# Patient Record
Sex: Male | Born: 1985 | Race: White | Hispanic: No | Marital: Single
Health system: Southern US, Community
[De-identification: ages and names within clinical notes are randomized; demographics above are authoritative.]

## PROBLEM LIST (undated history)

## (undated) ENCOUNTER — Ambulatory Visit (HOSPITAL_COMMUNITY): Admission: EM | Payer: 59

## (undated) DIAGNOSIS — F32A Depression, unspecified: Secondary | ICD-10-CM

## (undated) DIAGNOSIS — N289 Disorder of kidney and ureter, unspecified: Secondary | ICD-10-CM

## (undated) DIAGNOSIS — F329 Major depressive disorder, single episode, unspecified: Secondary | ICD-10-CM

## (undated) DIAGNOSIS — F1410 Cocaine abuse, uncomplicated: Secondary | ICD-10-CM

## (undated) DIAGNOSIS — I219 Acute myocardial infarction, unspecified: Secondary | ICD-10-CM

## (undated) DIAGNOSIS — F419 Anxiety disorder, unspecified: Secondary | ICD-10-CM

## (undated) DIAGNOSIS — N200 Calculus of kidney: Secondary | ICD-10-CM

## (undated) DIAGNOSIS — F1510 Other stimulant abuse, uncomplicated: Secondary | ICD-10-CM

## (undated) DIAGNOSIS — F1010 Alcohol abuse, uncomplicated: Secondary | ICD-10-CM

## (undated) DIAGNOSIS — F3181 Bipolar II disorder: Secondary | ICD-10-CM

## (undated) DIAGNOSIS — K279 Peptic ulcer, site unspecified, unspecified as acute or chronic, without hemorrhage or perforation: Secondary | ICD-10-CM

## (undated) DIAGNOSIS — F1110 Opioid abuse, uncomplicated: Secondary | ICD-10-CM

## (undated) DIAGNOSIS — F1910 Other psychoactive substance abuse, uncomplicated: Secondary | ICD-10-CM

---
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
## 2004-10-27 HISTORY — PX: HAND SURGERY: SHX662

## 2008-03-13 ENCOUNTER — Emergency Department (HOSPITAL_COMMUNITY): Admission: EM | Admit: 2008-03-13 | Discharge: 2008-03-13 | Payer: Self-pay | Admitting: Emergency Medicine

## 2010-04-05 ENCOUNTER — Emergency Department (HOSPITAL_COMMUNITY): Admission: EM | Admit: 2010-04-05 | Discharge: 2010-04-05 | Payer: Self-pay | Admitting: Family Medicine

## 2011-07-09 ENCOUNTER — Emergency Department (HOSPITAL_COMMUNITY)
Admission: EM | Admit: 2011-07-09 | Discharge: 2011-07-10 | Disposition: A | Discharge status: Home / Self Care | Payer: Worker's Compensation | Attending: Emergency Medicine | Admitting: Emergency Medicine

## 2011-07-09 ENCOUNTER — Emergency Department (HOSPITAL_COMMUNITY): Payer: Worker's Compensation

## 2011-07-09 DIAGNOSIS — Y9289 Other specified places as the place of occurrence of the external cause: Secondary | ICD-10-CM | POA: Insufficient documentation

## 2011-07-09 DIAGNOSIS — S6720XA Crushing injury of unspecified hand, initial encounter: Secondary | ICD-10-CM | POA: Insufficient documentation

## 2011-07-09 DIAGNOSIS — W319XXA Contact with unspecified machinery, initial encounter: Secondary | ICD-10-CM | POA: Insufficient documentation

## 2011-07-09 DIAGNOSIS — M79609 Pain in unspecified limb: Secondary | ICD-10-CM | POA: Insufficient documentation

## 2011-07-23 LAB — DIFFERENTIAL
Basophils Absolute: 0
Basophils Relative: 0
Eosinophils Absolute: 0.2
Eosinophils Relative: 1
Lymphocytes Relative: 13
Lymphs Abs: 1.7
Monocytes Absolute: 0.8
Monocytes Relative: 6
Neutro Abs: 10.6 — ABNORMAL HIGH
Neutrophils Relative %: 80 — ABNORMAL HIGH

## 2011-07-23 LAB — CBC
HCT: 46.8
Hemoglobin: 16.2
MCHC: 34.7
MCV: 91.5
Platelets: 215
RBC: 5.11
RDW: 13.3
WBC: 13.3 — ABNORMAL HIGH

## 2012-02-17 ENCOUNTER — Emergency Department (HOSPITAL_COMMUNITY): Payer: Self-pay

## 2012-02-17 ENCOUNTER — Encounter (HOSPITAL_COMMUNITY): Payer: Self-pay | Admitting: *Deleted

## 2012-02-17 ENCOUNTER — Emergency Department (HOSPITAL_COMMUNITY)
Admission: EM | Admit: 2012-02-17 | Discharge: 2012-02-17 | Disposition: A | Discharge status: Home / Self Care | Payer: Self-pay | Attending: Emergency Medicine | Admitting: Emergency Medicine

## 2012-02-17 DIAGNOSIS — R079 Chest pain, unspecified: Secondary | ICD-10-CM | POA: Insufficient documentation

## 2012-02-17 DIAGNOSIS — M542 Cervicalgia: Secondary | ICD-10-CM | POA: Insufficient documentation

## 2012-02-17 DIAGNOSIS — IMO0002 Reserved for concepts with insufficient information to code with codable children: Secondary | ICD-10-CM | POA: Insufficient documentation

## 2012-02-17 DIAGNOSIS — Z23 Encounter for immunization: Secondary | ICD-10-CM | POA: Insufficient documentation

## 2012-02-17 DIAGNOSIS — R0609 Other forms of dyspnea: Secondary | ICD-10-CM | POA: Insufficient documentation

## 2012-02-17 DIAGNOSIS — R0989 Other specified symptoms and signs involving the circulatory and respiratory systems: Secondary | ICD-10-CM | POA: Insufficient documentation

## 2012-02-17 DIAGNOSIS — R51 Headache: Secondary | ICD-10-CM | POA: Insufficient documentation

## 2012-02-17 DIAGNOSIS — R0602 Shortness of breath: Secondary | ICD-10-CM | POA: Insufficient documentation

## 2012-02-17 DIAGNOSIS — M549 Dorsalgia, unspecified: Secondary | ICD-10-CM | POA: Insufficient documentation

## 2012-02-17 DIAGNOSIS — F17200 Nicotine dependence, unspecified, uncomplicated: Secondary | ICD-10-CM | POA: Insufficient documentation

## 2012-02-17 DIAGNOSIS — R109 Unspecified abdominal pain: Secondary | ICD-10-CM | POA: Insufficient documentation

## 2012-02-17 DIAGNOSIS — S2220XA Unspecified fracture of sternum, initial encounter for closed fracture: Secondary | ICD-10-CM | POA: Insufficient documentation

## 2012-02-17 LAB — COMPREHENSIVE METABOLIC PANEL
AST: 20 U/L (ref 0–37)
Albumin: 4.5 g/dL (ref 3.5–5.2)
CO2: 23 mEq/L (ref 19–32)
Calcium: 9.8 mg/dL (ref 8.4–10.5)
Creatinine, Ser: 1.16 mg/dL (ref 0.50–1.35)
GFR calc non Af Amer: 85 mL/min — ABNORMAL LOW (ref >90)
Total Protein: 8.3 g/dL (ref 6.0–8.3)

## 2012-02-17 LAB — POCT I-STAT, CHEM 8
Chloride: 108 mEq/L (ref 96–112)
Creatinine, Ser: 1.2 mg/dL (ref 0.50–1.35)
Glucose, Bld: 102 mg/dL — ABNORMAL HIGH (ref 70–99)
Potassium: 4.1 mEq/L (ref 3.5–5.1)

## 2012-02-17 LAB — CBC
Hemoglobin: 15.4 g/dL (ref 13.0–17.0)
MCH: 31.4 pg (ref 26.0–34.0)
RBC: 4.9 MIL/uL (ref 4.22–5.81)

## 2012-02-17 LAB — DIFFERENTIAL
Eosinophils Absolute: 0.1 10*3/uL (ref 0.0–0.7)
Lymphs Abs: 2.8 10*3/uL (ref 0.7–4.0)
Monocytes Relative: 8 % (ref 3–12)
Neutro Abs: 18.7 10*3/uL — ABNORMAL HIGH (ref 1.7–7.7)
Neutrophils Relative %: 80 % — ABNORMAL HIGH (ref 43–77)

## 2012-02-17 LAB — ETHANOL: Alcohol, Ethyl (B): <11 mg/dL (ref 0–11)

## 2012-02-17 MED ORDER — OXYCODONE-ACETAMINOPHEN 5-325 MG PO TABS: 2.0000 | ORAL_TABLET | ORAL | Status: AC | PRN

## 2012-02-17 MED ORDER — FENTANYL CITRATE 0.05 MG/ML IJ SOLN
50.0000 ug | Freq: Once | INTRAMUSCULAR | Status: AC
  Administered 2012-02-17: 50 ug via INTRAVENOUS
  Filled 2012-02-17: qty 2

## 2012-02-17 MED ORDER — IOHEXOL 300 MG/ML  SOLN
100.0000 mL | Freq: Once | INTRAMUSCULAR | Status: AC | PRN
  Administered 2012-02-17: 100 mL via INTRAVENOUS

## 2012-02-17 MED ORDER — TETANUS-DIPHTHERIA TOXOIDS TD 5-2 LFU IM INJ
0.5000 mL | INJECTION | Freq: Once | INTRAMUSCULAR | Status: AC
  Administered 2012-02-17: 0.5 mL via INTRAMUSCULAR
  Filled 2012-02-17: qty 0.5

## 2012-02-17 MED ORDER — OXYCODONE-ACETAMINOPHEN 5-325 MG PO TABS
2.0000 | ORAL_TABLET | Freq: Once | ORAL | Status: AC
  Administered 2012-02-17: 2 via ORAL
  Filled 2012-02-17: qty 2

## 2012-02-17 MED ORDER — ONDANSETRON HCL 4 MG PO TABS: 4.0000 mg | ORAL_TABLET | Freq: Four times a day (QID) | ORAL | Status: AC

## 2012-02-17 NOTE — Progress Notes (Signed)
Orthopedic Tech Progress Note Patient Details:  [name] 01-21-1986 [id number]  Patient ID: Brian [name], male   DOB: 03-23-1986, 26 y.o.   MRN: [id number] Made trauma visit  Hildred Priest 02/17/2012, 4:53 PM
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
## 2012-02-17 NOTE — ED Notes (Signed)
MD at bedside. 

## 2012-02-17 NOTE — ED Notes (Signed)
Patient transported to CT 
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
## 2012-02-18 ENCOUNTER — Telehealth: Payer: Self-pay | Admitting: Physician Assistant
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
## 2012-02-19 ENCOUNTER — Ambulatory Visit (INDEPENDENT_AMBULATORY_CARE_PROVIDER_SITE_OTHER): Payer: Self-pay | Admitting: Physician Assistant

## 2012-02-19 ENCOUNTER — Encounter (INDEPENDENT_AMBULATORY_CARE_PROVIDER_SITE_OTHER): Payer: Self-pay

## 2012-02-19 VITALS — BP 118/78 | HR 80 | Ht 68.0 in | Wt 131.6 lb

## 2012-02-19 DIAGNOSIS — IMO0002 Reserved for concepts with insufficient information to code with codable children: Secondary | ICD-10-CM

## 2012-02-19 DIAGNOSIS — T07XXXA Unspecified multiple injuries, initial encounter: Secondary | ICD-10-CM

## 2012-02-19 MED ORDER — HYDROCODONE-ACETAMINOPHEN 5-325 MG PO TABS: 1.0000 | ORAL_TABLET | ORAL | Status: DC | PRN

## 2012-02-19 MED ORDER — TRAMADOL HCL 50 MG PO TABS: 50.0000 mg | ORAL_TABLET | Freq: Four times a day (QID) | ORAL | Status: DC | PRN

## 2012-02-19 NOTE — Patient Instructions (Signed)
No lifting more than 10 to 15 lbs for the next 4 weeks, then increase activities as tolerated.
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
## 2012-02-25 ENCOUNTER — Telehealth: Payer: Self-pay | Admitting: Orthopedic Surgery

## 2012-02-25 MED ORDER — HYDROCODONE-ACETAMINOPHEN 5-325 MG PO TABS: 1.0000 | ORAL_TABLET | ORAL | Status: AC | PRN

## 2012-02-25 NOTE — Telephone Encounter (Signed)
Patient called in for more Norco. Says the tramadol makes him throw up. Only taking 200-400mg  ibuprofen 4x/day.  Discussed case with Dr. Hulen Skains.  Called in one final rx for Norco 5/325, #30, NR. Advised increase ibuprofen to 800mg  tid.

## 2012-02-27 ENCOUNTER — Telehealth: Payer: Self-pay | Admitting: Orthopedic Surgery

## 2012-02-27 NOTE — Telephone Encounter (Signed)
Says he noticed bruising over his sternum for the first time yesterday, wanted to make sure that was nothing to worry about. I reassured him.

## 2012-03-02 ENCOUNTER — Telehealth: Payer: Self-pay | Admitting: Orthopedic Surgery

## 2012-03-02 NOTE — Telephone Encounter (Signed)
Left message explaining that we couldn't refill the patient's medication. He should call back if we could be of any other assistance.

## 2012-03-03 ENCOUNTER — Telehealth: Payer: Self-pay | Admitting: Orthopedic Surgery

## 2012-03-03 NOTE — Telephone Encounter (Signed)
Patient called to say that his sternum was hurting more because he was getting sick, likely caught from his fiancee who was recently diagnosed with some viral respiratory illness. I explained that I could not refill his narcotic medication because of the decision made by my supervising physician but suggested he see a PCP who may be able to help ameliorate the symptoms of the URI that are causing him to hurt more.

## 2012-03-03 NOTE — Telephone Encounter (Signed)
Returned phone call.

## 2012-05-31 ENCOUNTER — Emergency Department (HOSPITAL_COMMUNITY)
Admission: EM | Admit: 2012-05-31 | Discharge: 2012-06-01 | Disposition: A | Discharge status: Home / Self Care | Payer: Self-pay | Attending: Emergency Medicine | Admitting: Emergency Medicine

## 2012-05-31 ENCOUNTER — Encounter (HOSPITAL_COMMUNITY): Payer: Self-pay | Admitting: Emergency Medicine

## 2012-05-31 DIAGNOSIS — N200 Calculus of kidney: Secondary | ICD-10-CM | POA: Insufficient documentation

## 2012-05-31 DIAGNOSIS — R319 Hematuria, unspecified: Secondary | ICD-10-CM | POA: Insufficient documentation

## 2012-05-31 DIAGNOSIS — R10813 Right lower quadrant abdominal tenderness: Secondary | ICD-10-CM | POA: Insufficient documentation

## 2012-05-31 DIAGNOSIS — R1031 Right lower quadrant pain: Secondary | ICD-10-CM | POA: Insufficient documentation

## 2012-05-31 LAB — CBC WITH DIFFERENTIAL/PLATELET
Basophils Absolute: 0 10*3/uL (ref 0.0–0.1)
Eosinophils Relative: 3 % (ref 0–5)
Lymphocytes Relative: 32 % (ref 12–46)
MCV: 92 fL (ref 78.0–100.0)
Platelets: 226 10*3/uL (ref 150–400)
RDW: 13.3 % (ref 11.5–15.5)
WBC: 9.2 10*3/uL (ref 4.0–10.5)

## 2012-05-31 LAB — COMPREHENSIVE METABOLIC PANEL
ALT: 8 U/L (ref 0–53)
AST: 15 U/L (ref 0–37)
CO2: 29 mEq/L (ref 19–32)
Calcium: 9.5 mg/dL (ref 8.4–10.5)
GFR calc non Af Amer: >90 mL/min (ref >90)
Sodium: 139 mEq/L (ref 135–145)
Total Protein: 7.8 g/dL (ref 6.0–8.3)

## 2012-05-31 LAB — URINALYSIS, ROUTINE W REFLEX MICROSCOPIC
Bilirubin Urine: NEGATIVE
Glucose, UA: NEGATIVE mg/dL
Specific Gravity, Urine: 1.012 (ref 1.005–1.030)
pH: 7 (ref 5.0–8.0)

## 2012-05-31 LAB — URINE MICROSCOPIC-ADD ON

## 2012-05-31 NOTE — ED Notes (Signed)
For 6 months, has been having RLQ pain; for past 3 months it has been getting worse and started to have hematuria (last time was mon/tues of last week); now pain in bil lower abd; denies dysuria; n/v

## 2012-05-31 NOTE — ED Notes (Signed)
Updated on wait times

## 2012-06-01 ENCOUNTER — Emergency Department (HOSPITAL_COMMUNITY): Payer: Self-pay

## 2012-06-01 LAB — PSA: PSA: 0.2 ng/mL (ref <=4.00)

## 2012-06-01 MED ORDER — HYDROCODONE-ACETAMINOPHEN 5-500 MG PO TABS: 1.0000 | ORAL_TABLET | Freq: Four times a day (QID) | ORAL | Status: AC | PRN

## 2012-06-01 MED ORDER — HYDROCODONE-ACETAMINOPHEN 5-325 MG PO TABS
1.0000 | ORAL_TABLET | Freq: Once | ORAL | Status: AC
  Administered 2012-06-01: 1 via ORAL
  Filled 2012-06-01: qty 1

## 2012-06-01 NOTE — ED Notes (Signed)
The patient is AOx4 and comfortable with his discharge instructions.  His wife is driving him home.

## 2012-06-01 NOTE — ED Provider Notes (Signed)
History     CSN: [id number]  Arrival date & time 05/31/12  2118   First MD Initiated Contact with Patient 06/01/12 0014      Chief Complaint  Patient presents with  . Pelvic Pain  . Abdominal Pain    RLQ  . Flank Pain    Right    (Consider location/radiation/quality/duration/timing/severity/associated sxs/prior treatment) HPI Comments: Patient presents with the complaints of lower abdominal pain, hematuria for the past several months. The pain is mainly in the right lower quadrant.  He denies fevers.    Patient is a 26 y.o. male presenting with pelvic pain, abdominal pain, and flank pain. The history is provided by the patient.  Pelvic Pain This is a chronic problem. Episode onset: 4-5 months ago. The problem occurs constantly. The problem has been gradually worsening. Associated symptoms include abdominal pain. Nothing aggravates the symptoms. Nothing relieves the symptoms. He has tried nothing for the symptoms.  Abdominal Pain The primary symptoms of the illness include abdominal pain.  Flank Pain Associated symptoms include abdominal pain.    History reviewed. No pertinent past medical history.  Past Surgical History  Procedure Date  . Hand surgery 2006    History reviewed. No pertinent family history.  History  Substance Use Topics  . Smoking status: Current Everyday Smoker -- 0.5 packs/day    Types: Cigarettes  . Smokeless tobacco: Not on file  . Alcohol Use: No      Review of Systems  Gastrointestinal: Positive for abdominal pain.  Genitourinary: Positive for flank pain and pelvic pain.  All other systems reviewed and are negative.    Allergies  Review of patient's allergies indicates no known allergies.  Home Medications   Current Outpatient Rx  Name Route Sig Dispense Refill  . HYDROCODONE-ACETAMINOPHEN 5-325 MG PO TABS Oral Take 1-2 tablets by mouth every 4 (four) hours as needed for pain. 30 tablet 0    BP 120/76  Pulse 84  Temp 98.5 F  (36.9 C) (Oral)  Resp 17  Ht 5\' 8"  (1.727 m)  Wt 135 lb (61.236 kg)  BMI 20.53 kg/m2  SpO2 98%  Physical Exam  Nursing note and vitals reviewed. Constitutional: He is oriented to person, place, and time. He appears well-developed and well-nourished. No distress.  HENT:  Head: Normocephalic.  Mouth/Throat: Oropharynx is clear and moist.  Neck: Normal range of motion. Neck supple.  Cardiovascular: Normal rate and regular rhythm.   Pulmonary/Chest: Effort normal and breath sounds normal.  Abdominal: Soft. Bowel sounds are normal. He exhibits no distension. There is tenderness.       There is ttp in the right lower quadrant without rebound.  There is voluntary guarding.  Musculoskeletal: Normal range of motion.  Neurological: He is alert and oriented to person, place, and time.  Skin: Skin is warm and dry. He is not diaphoretic.    ED Course  Procedures (including critical care time)  Labs Reviewed  COMPREHENSIVE METABOLIC PANEL - Abnormal; Notable for the following:    Total Bilirubin 0.1 (*)     All other components within normal limits  URINALYSIS, ROUTINE W REFLEX MICROSCOPIC - Abnormal; Notable for the following:    APPearance CLOUDY (*)     Hgb urine dipstick SMALL (*)     All other components within normal limits  CBC WITH DIFFERENTIAL  URINE MICROSCOPIC-ADD ON  PSA   No results found.   No diagnosis found.    MDM  The patient presents with hematuria and right  lower abd pain for several months.  I have found nothing to suggest an obstructing calculus, just a stone in the kidney.  There is also no alternative diagnosis from today's ct scan.  A psa was ordered as the patient tells me his father was diagnosed with prostate cancer at a young age.  At this point, he will be discharged to home.  I will recommend follow up with urology if his symptoms persist.  The psa will likely take a few days.          Veryl Speak, MD 06/01/12 [phone]

## 2013-03-29 ENCOUNTER — Encounter (HOSPITAL_COMMUNITY): Payer: Self-pay | Admitting: Emergency Medicine

## 2013-03-29 ENCOUNTER — Emergency Department (HOSPITAL_COMMUNITY)
Admission: EM | Admit: 2013-03-29 | Discharge: 2013-03-29 | Disposition: A | Discharge status: Home / Self Care | Payer: Self-pay | Attending: Emergency Medicine | Admitting: Emergency Medicine

## 2013-03-29 DIAGNOSIS — F17200 Nicotine dependence, unspecified, uncomplicated: Secondary | ICD-10-CM | POA: Insufficient documentation

## 2013-03-29 DIAGNOSIS — R209 Unspecified disturbances of skin sensation: Secondary | ICD-10-CM | POA: Insufficient documentation

## 2013-03-29 DIAGNOSIS — R5383 Other fatigue: Secondary | ICD-10-CM | POA: Insufficient documentation

## 2013-03-29 DIAGNOSIS — R5381 Other malaise: Secondary | ICD-10-CM | POA: Insufficient documentation

## 2013-03-29 DIAGNOSIS — G5600 Carpal tunnel syndrome, unspecified upper limb: Secondary | ICD-10-CM | POA: Insufficient documentation

## 2013-03-29 DIAGNOSIS — G5602 Carpal tunnel syndrome, left upper limb: Secondary | ICD-10-CM

## 2013-03-29 DIAGNOSIS — Z9889 Other specified postprocedural states: Secondary | ICD-10-CM | POA: Insufficient documentation

## 2013-03-29 MED ORDER — IBUPROFEN 400 MG PO TABS
800.0000 mg | ORAL_TABLET | Freq: Once | ORAL | Status: AC
  Administered 2013-03-29: 800 mg via ORAL
  Filled 2013-03-29: qty 2

## 2013-03-29 MED ORDER — HYDROCODONE-ACETAMINOPHEN 5-325 MG PO TABS: ORAL_TABLET | ORAL | Status: DC

## 2013-03-29 NOTE — ED Provider Notes (Signed)
History     CSN: [id number]  Arrival date & time 03/29/13  1403   First MD Initiated Contact with Patient 03/29/13 1412      Chief Complaint  Patient presents with  . Hand Pain    (Consider location/radiation/quality/duration/timing/severity/associated sxs/prior treatment) HPI  Brian [name] is a 27 y.o. male complaining of left wrist pain rated at 7/10, worse at night, worsening over the course last 4 days. He also reports associated symptoms of weakness, sensation of discoordination and paresthesia to digits one through 3. Patient is right-hand dominant however he injured his right hand and he's been using his left hand more frequently. He used to work in [employer] he is not working last month. He states that he has been using the left hand doing yard work recently. He denies any trauma to the left hand,  head trauma, neck pain   History reviewed. No pertinent past medical history.  Past Surgical History  Procedure Laterality Date  . Hand surgery  2006    History reviewed. No pertinent family history.  History  Substance Use Topics  . Smoking status: Current Every Day Smoker -- 0.50 packs/day    Types: Cigarettes  . Smokeless tobacco: Not on file  . Alcohol Use: No      Review of Systems  Constitutional: Negative for fever.  Respiratory: Negative for shortness of breath.   Cardiovascular: Negative for chest pain.  Gastrointestinal: Negative for nausea, vomiting, abdominal pain and diarrhea.  Musculoskeletal: Positive for arthralgias.  Neurological: Positive for weakness and numbness.  All other systems reviewed and are negative.    Allergies  Review of patient's allergies indicates not on file.  Home Medications   Current Outpatient Rx  Name  Route  Sig  Dispense  Refill  . acetaminophen (TYLENOL) 500 MG tablet   Oral   Take 1,000 mg by mouth every 6 (six) hours as needed for pain.           BP 137/87  Pulse 62  Temp(Src) 98.3 F (36.8 C)  (Oral)  Resp 20  SpO2 100%  Physical Exam  Nursing note and vitals reviewed. Constitutional: He is oriented to person, place, and time. He appears well-developed and well-nourished. No distress.  HENT:  Head: Normocephalic.  Eyes: Conjunctivae and EOM are normal.  Neck: Normal range of motion. Neck supple.  No midline tenderness to palpation or step-offs appreciated. Patient has full range of motion without pain.    Cardiovascular: Normal rate.   Pulmonary/Chest: Effort normal. No stridor.  Musculoskeletal: Normal range of motion.  No trauma, no deformity, no erythema, no swelling to left hand and wrist. Positive for now and negative Phalen test. Positive carpal compression test. Grip strength on the left side is 4/5. Distal sensation is grossly intact.  Neurological: He is alert and oriented to person, place, and time.  Psychiatric: He has a normal mood and affect.    ED Course  Procedures (including critical care time)  Labs Reviewed - No data to display No results found.   1. Carpal tunnel syndrome of left wrist       MDM   Filed Vitals:   03/29/13 1407  BP: 137/87  Pulse: 62  Temp: 98.3 F (36.8 C)  TempSrc: Oral  Resp: 20  SpO2: 100%     Brian [name] is a 27 y.o. male with left hand pain, weakness and continuous paresthesia in the first 3 digits. This is consistent with carpal tunnel. I will give him  a wrist splint, pain control and orthopedic followup.  Medications  ibuprofen (ADVIL,MOTRIN) tablet 800 mg (800 mg Oral Given 03/29/13 1457)    The patient is hemodynamically stable, appropriate for, and amenable to, discharge at this time. Pt verbalized understanding and agrees with care plan. Outpatient follow-up and return precautions given.    Discharge Medication List as of 03/29/2013  2:33 PM    START taking these medications   Details  HYDROcodone-acetaminophen (NORCO/VICODIN) 5-325 MG per tablet Take 1-2 tablets by mouth every 6 hours as needed  for pain., Print               Monico Blitz, PA-C 03/29/13 1630

## 2013-03-29 NOTE — ED Notes (Signed)
Pt reports left hand pain. No known injury. Pt c/o weakness in left hand and pins and needles sensation. Pt states took extra strength tylenol at 930. Distal circulation intact.

## 2013-03-31 ENCOUNTER — Encounter (HOSPITAL_COMMUNITY): Payer: Self-pay | Admitting: Family Medicine

## 2013-03-31 ENCOUNTER — Emergency Department (HOSPITAL_COMMUNITY)
Admission: EM | Admit: 2013-03-31 | Discharge: 2013-03-31 | Disposition: A | Discharge status: Home / Self Care | Payer: Self-pay | Attending: Emergency Medicine | Admitting: Emergency Medicine

## 2013-03-31 DIAGNOSIS — R5383 Other fatigue: Secondary | ICD-10-CM | POA: Insufficient documentation

## 2013-03-31 DIAGNOSIS — F17200 Nicotine dependence, unspecified, uncomplicated: Secondary | ICD-10-CM | POA: Insufficient documentation

## 2013-03-31 DIAGNOSIS — G5600 Carpal tunnel syndrome, unspecified upper limb: Secondary | ICD-10-CM | POA: Insufficient documentation

## 2013-03-31 DIAGNOSIS — R5381 Other malaise: Secondary | ICD-10-CM | POA: Insufficient documentation

## 2013-03-31 DIAGNOSIS — R209 Unspecified disturbances of skin sensation: Secondary | ICD-10-CM | POA: Insufficient documentation

## 2013-03-31 DIAGNOSIS — G5602 Carpal tunnel syndrome, left upper limb: Secondary | ICD-10-CM

## 2013-03-31 DIAGNOSIS — Z9889 Other specified postprocedural states: Secondary | ICD-10-CM | POA: Insufficient documentation

## 2013-03-31 MED ORDER — OXYCODONE-ACETAMINOPHEN 5-325 MG PO TABS: 1.0000 | ORAL_TABLET | Freq: Four times a day (QID) | ORAL | Status: DC | PRN

## 2013-03-31 MED ORDER — PREDNISONE 20 MG PO TABS: ORAL_TABLET | ORAL | Status: DC

## 2013-03-31 NOTE — ED Notes (Addendum)
Per pt sts increased pain and numbness in left arm. sts was dx with carpal tunnel. sts pain medication is not working.

## 2013-03-31 NOTE — ED Provider Notes (Signed)
Medical screening examination/treatment/procedure(s) were performed by non-physician practitioner and as supervising physician I was immediately available for consultation/collaboration.   Tranquilino Fischler B. Karle Starch, MD 03/31/13 1553

## 2013-03-31 NOTE — ED Provider Notes (Signed)
History    This chart was scribed for Alecia Lemming, PA working with Mervin Kung, MD by ED Scribe, Leeroy Cha. This patient was seen in room TR05C/TR05C and the patient's care was started at 3:51 PM.   CSN: [id number]  Arrival date & time 03/31/13  1542   First MD Initiated Contact with Patient 03/31/13 1551      Chief Complaint  Patient presents with  . Hand Pain  . Numbness    (Consider location/radiation/quality/duration/timing/severity/associated sxs/prior treatment) Patient is a 27 y.o. male presenting with hand pain. The history is provided by the patient. No language interpreter was used.  Hand Pain The current episode started more than 1 week ago. The problem occurs constantly. The problem has not changed since onset.Pertinent negatives include no shortness of breath. Exacerbated by: movement. Nothing relieves the symptoms.   HPI Comments: Brian [name] is a 27 y.o. male, right-handed, who presents to the Emergency Department complaining of moderate constant left hand pain with associated numbness and weakness onset last week. Pt was here last week in the ED and was referred to Dr. Ronnie Derby, a hand specialist. Apparently this specialist does not see carpal tunnel patients (per patient) and he was told to follow-up with another group -- who has not called him back. He states that he has some numbness and pain in his first three digits. He complains of an intermittent radiating sharp pain that radiates up his left elbow. Pt denies fever, chills, cough, nausea, vomiting, diarrhea, SOB, weakness, and any other associated symptoms. 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   History reviewed. No pertinent past medical history.  Past Surgical History  Procedure Laterality Date  . Hand surgery  2006    History reviewed. No pertinent family history.  History  Substance Use Topics  . Smoking status: Current Every Day Smoker -- 0.50  packs/day    Types: Cigarettes  . Smokeless tobacco: Not on file  . Alcohol Use: No      Review of Systems  Constitutional: Negative for fever, chills and activity change.  HENT: Negative for congestion, sore throat and neck pain.   Respiratory: Negative for cough and shortness of breath.   Gastrointestinal: Negative for nausea, vomiting and diarrhea.  Genitourinary: Negative for dysuria.  Musculoskeletal: Positive for myalgias. Negative for back pain, joint swelling, arthralgias and gait problem.  Skin: Negative for wound.  Neurological: Positive for weakness and numbness.    Allergies  Review of patient's allergies indicates no known allergies.  Home Medications   Current Outpatient Rx  Name  Route  Sig  Dispense  Refill  . acetaminophen (TYLENOL) 500 MG tablet   Oral   Take 1,000 mg by mouth every 6 (six) hours as needed for pain.         Marland Kitchen HYDROcodone-acetaminophen (NORCO/VICODIN) 5-325 MG per tablet      Take 1-2 tablets by mouth every 6 hours as needed for pain.   15 tablet   0     BP 127/76  Pulse 60  Temp(Src) 97.3 F (36.3 C)  Resp 18  SpO2 100%  Physical Exam  Nursing note and vitals reviewed. Constitutional: He appears well-developed and well-nourished. No distress.  HENT:  Head: Normocephalic and atraumatic.  Eyes: Conjunctivae and EOM are normal.  Neck: Normal range of motion. Neck supple. No tracheal deviation present.  Cardiovascular: Normal rate, regular rhythm, normal heart sounds, intact distal pulses and normal pulses.  Exam reveals no gallop and no friction rub.   No murmur heard. Pulmonary/Chest: Effort normal and breath sounds normal. No respiratory distress.  Musculoskeletal: He exhibits tenderness. He exhibits no edema.  Positive Tinel's sign in left wrist. Tenderness over the radial aspect of the wrist. Decreased strength of thumb in flexion, to resistance.   Neurological: He is alert. No sensory deficit.  Motor, sensation, and  vascular distal to the injury is fully intact.   Skin: Skin is warm and dry.  Psychiatric: He has a normal mood and affect. His behavior is normal.    ED Course  Procedures (including critical care time) DIAGNOSTIC STUDIES: Oxygen Saturation is 100% on room air, normal by my interpretation.    COORDINATION OF CARE:  4:20 PM Discussed with pt of possible carpal tunnel diagnosis. Referring pt to an orthopedist for further evaluation.    Labs Reviewed - No data to display No results found.   1. Carpal tunnel syndrome, left    Patient seen and examined.   Vital signs reviewed and are as follows: Filed Vitals:   03/31/13 1549  BP: 127/76  Pulse: 60  Temp: 97.3 F (36.3 C)  Resp: 18   New referral given. Course of steroids prescribed. Vicodin changed to Percocet.   Patient counseled on use of narcotic pain medications. Counseled not to combine these medications with others containing tylenol. Urged not to drink alcohol, drive, or perform any other activities that requires focus while taking these medications. The patient verbalizes understanding and agrees with the plan.  Patient asked to continue to rest wrist as much as possible.      MDM  Hand/wrist vascularly intact. Symptoms c/w carpal tunnel syndrome. Tx as above.       I personally performed the services described in this documentation, which was scribed in my presence. The recorded information has been reviewed and is accurate.    Carlisle Cater, PA-C 03/31/13 1745

## 2013-03-31 NOTE — ED Notes (Signed)
Recheck CTS

## 2013-04-02 NOTE — ED Provider Notes (Signed)
Medical screening examination/treatment/procedure(s) were performed by non-physician practitioner and as supervising physician I was immediately available for consultation/collaboration.   Mervin Kung, MD 04/02/13 [phone]

## 2013-04-04 ENCOUNTER — Encounter (HOSPITAL_COMMUNITY): Payer: Self-pay | Admitting: Emergency Medicine

## 2013-04-04 ENCOUNTER — Emergency Department (HOSPITAL_COMMUNITY)
Admission: EM | Admit: 2013-04-04 | Discharge: 2013-04-04 | Disposition: A | Discharge status: Rehabilitation Facility | Payer: Self-pay | Attending: Emergency Medicine | Admitting: Emergency Medicine

## 2013-04-04 DIAGNOSIS — F17200 Nicotine dependence, unspecified, uncomplicated: Secondary | ICD-10-CM | POA: Insufficient documentation

## 2013-04-04 DIAGNOSIS — F1910 Other psychoactive substance abuse, uncomplicated: Secondary | ICD-10-CM

## 2013-04-04 DIAGNOSIS — F1220 Cannabis dependence, uncomplicated: Secondary | ICD-10-CM | POA: Insufficient documentation

## 2013-04-04 DIAGNOSIS — F1120 Opioid dependence, uncomplicated: Secondary | ICD-10-CM | POA: Insufficient documentation

## 2013-04-04 HISTORY — DX: Opioid abuse, uncomplicated: F11.10

## 2013-04-04 LAB — RAPID URINE DRUG SCREEN, HOSP PERFORMED
Amphetamines: NOT DETECTED
Benzodiazepines: NOT DETECTED
Opiates: NOT DETECTED
Tetrahydrocannabinol: POSITIVE — AB

## 2013-04-04 LAB — CBC
Platelets: 254 10*3/uL (ref 150–400)
RBC: 4.4 MIL/uL (ref 4.22–5.81)
WBC: 7.6 10*3/uL (ref 4.0–10.5)

## 2013-04-04 LAB — COMPREHENSIVE METABOLIC PANEL
ALT: 16 U/L (ref 0–53)
AST: 18 U/L (ref 0–37)
Albumin: 4 g/dL (ref 3.5–5.2)
CO2: 29 mEq/L (ref 19–32)
Chloride: 103 mEq/L (ref 96–112)
GFR calc non Af Amer: 85 mL/min — ABNORMAL LOW (ref >90)
Sodium: 139 mEq/L (ref 135–145)
Total Bilirubin: 0.2 mg/dL — ABNORMAL LOW (ref 0.3–1.2)

## 2013-04-04 LAB — ACETAMINOPHEN LEVEL: Acetaminophen (Tylenol), Serum: <15 ug/mL (ref 10–30)

## 2013-04-04 MED ORDER — LORAZEPAM 1 MG PO TABS
1.0000 mg | ORAL_TABLET | Freq: Three times a day (TID) | ORAL | Status: DC | PRN
  Administered 2013-04-04: 1 mg via ORAL
  Filled 2013-04-04 (×2): qty 1

## 2013-04-04 MED ORDER — ACETAMINOPHEN 325 MG PO TABS: 650.0000 mg | ORAL_TABLET | ORAL | Status: DC | PRN

## 2013-04-04 MED ORDER — ONDANSETRON HCL 4 MG PO TABS: 4.0000 mg | ORAL_TABLET | Freq: Three times a day (TID) | ORAL | Status: DC | PRN

## 2013-04-04 MED ORDER — ALUM & MAG HYDROXIDE-SIMETH 200-200-20 MG/5ML PO SUSP: 30.0000 mL | ORAL | Status: DC | PRN

## 2013-04-04 MED ORDER — ZOLPIDEM TARTRATE 5 MG PO TABS: 5.0000 mg | ORAL_TABLET | Freq: Every evening | ORAL | Status: DC | PRN

## 2013-04-04 MED ORDER — IBUPROFEN 400 MG PO TABS: 600.0000 mg | ORAL_TABLET | Freq: Three times a day (TID) | ORAL | Status: DC | PRN

## 2013-04-04 MED ORDER — NICOTINE 21 MG/24HR TD PT24
21.0000 mg | MEDICATED_PATCH | Freq: Every day | TRANSDERMAL | Status: DC
  Administered 2013-04-04: 21 mg via TRANSDERMAL
  Filled 2013-04-04: qty 1

## 2013-04-04 NOTE — ED Notes (Signed)
Call girlfriend with changed to pt condition. Roselyn Reef) [phone]

## 2013-04-04 NOTE — ED Notes (Addendum)
Pt to the bathroom- when this writer checked on patient- bathroom was empty. Girlfriend was in room, stated that he may have gone outside to smoke. Security and [employer] notified.

## 2013-04-04 NOTE — BH Assessment (Signed)
Assessment Note   Brian [name] is an 27 y.o. male that presents to [facility] with his girlfriend requesting detox from opiates and benzos.  Pt reports he has been using for years after surgeries years ago.  Pt reports he currently uses 3-10 mg Percocets daily, last use 1230 today and pt had 3.5 10 mg Percocets.  Pt stated when he can't get these, he will use Vicodin (unknown amount, but states he rearely takes them "because they don't do anything for me).  Pt reports use of marijuana 1-3 x/week, last use was 2 blunts on Saturday over the weekend, and that he uses benzos (Klonopin 1 mg or Xanax - unknown mg) when he can't get opiates.  Pt also admits to using Opana, an opiate he stated he rarely uses because he can't afford it and that he last had it one week ago (unknown amount).  Pt stated he uses to use large doses of Dilaudid and Opana in the past, cut can no longer afford it.  Pt admits to depressive sx and having recent thoughts of passive SI, stating he felt his family would be better off without him because of the drugs.  Pt denies current SI, HI or psychosis and has no previous mental health hx.  Pt has tried to detox on his own twice in 2012 from opiates, for one and a half months, but continued using marijuana and benzos "to take the edge off."  Pt lost his job last year, has relationship problems with his girlfriend, has an 36 month old baby, and has had to move back in with his parents.  These are great stressors for the pt.  Pt's girlfriend is supportive, as is his family.  Pt was cooperative during assessment and reported current withdrawal sx are his stomach hurting and anxiety.  Pt reports depressive sx related to his situation.  Called [facility] and no beds per [facility] @ 1715.  Called [facility] and per Judson Roch @ 1717, beds available.  Completed [facility] prescreen.  Referral and prescreen faxed for review.  Updated ED staff.  Axis I: 304.80 Polysubstance Dependence, 311 Depressive Disorder NOS Axis II:  Deferred Axis III:  Past Medical History  Diagnosis Date  . Narcotic abuse    Axis IV: economic problems, housing problems, occupational problems, other psychosocial or environmental problems, problems related to social environment, problems with access to health care services and problems with primary support group Axis V: 41-50 serious symptoms  Past Medical History:  Past Medical History  Diagnosis Date  . Narcotic abuse     Past Surgical History  Procedure Laterality Date  . Hand surgery  2006    Family History: History reviewed. No pertinent family history.  Social History:  reports that he has been smoking Cigarettes.  He has been smoking about 0.50 packs per day. He does not have any smokeless tobacco history on file. He reports that he uses illicit drugs (Marijuana). He reports that he does not drink alcohol.  Additional Social History:  Alcohol / Drug Use Pain Medications: opiate abuse Prescriptions: benzo abuse Over the Counter: pt denies History of alcohol / drug use?: Yes Longest period of sobriety (when/how long): 1.5 mos - 2012 (only sober from opiates - still used benzos and marijuana) Negative Consequences of Use: Financial;Personal relationships;Work / Youth worker Withdrawal Symptoms: Irritability;Nausea / Vomiting;Patient aware of relationship between substance abuse and physical/medical complications Substance #1 Name of Substance 1: Opiates - Percocet, Dilaudid, Vicodin, Opana 1 - Age of First Use: 18  1 - Amount (size/oz): varies - states uses 3-10 5 mg Percocets or 8 mg of Dilaudid, last used unk amout of Opana one week ago, Vicodin several mos ago 1 - Frequency: daily, but type depends on when he can get it 1 - Duration: ongoing for years 1 - Last Use / Amount: Today @ 1230 - 3.5 10 mg Percocets Substance #2 Name of Substance 2: Marijuana 2 - Age of First Use: 13 2 - Amount (size/oz): 2 blunts 2 - Frequency: several days per week 2 - Duration: ongoing 2 -  Last Use / Amount: 04/02/13 - 2 blunts Substance #3 Name of Substance 3: Benzos - Xanax or Klonopin 3 - Age of First Use: 13 3 - Amount (size/oz): varies - mainly 1 mg Klonopin 3 - Frequency: 1-3 x/week 3 - Duration: ongoing  3 - Last Use / Amount: 04/03/13 - 1 mg Klonopin  CIWA: CIWA-Ar BP: 139/89 mmHg Pulse Rate: 82 COWS: Clinical Opiate Withdrawal Scale (COWS) Resting Pulse Rate: Pulse Rate 81-100 Sweating: Subjective report of chills or flushing Restlessness: Reports difficulty sitting still, but is able to do so Pupil Size: Pupils pinned or normal size for room light Bone or Joint Aches: Not present Runny Nose or Tearing: Not present GI Upset: No GI symptoms Tremor: No tremor Yawning: Yawning once or twice during assessment Anxiety or Irritability: Patient obviously irritable/anxious Gooseflesh Skin: Skin is smooth COWS Total Score: 6  Allergies: No Known Allergies  Home Medications:  (Not in a hospital admission)  OB/GYN Status:  No LMP for male patient.  General Assessment Data Location of Assessment: [facility] ED Living Arrangements: Spouse/significant other;Parent (lives with his parents and girlfriend) Can pt return to current living arrangement?: Yes Admission Status: Voluntary Is patient capable of signing voluntary admission?: Yes Transfer from: [facility] Referral Source: Self/Family/Friend  Education Status Is patient currently in school?: No  Risk to self Suicidal Ideation: No Suicidal Intent: No Is patient at risk for suicide?: No Suicidal Plan?: No Access to Means: No What has been your use of drugs/alcohol within the last 12 months?: Reports use of opiates, marijuana, and cocaine Previous Attempts/Gestures: No How many times?: 0 (Admits to passive SI in past) Other Self Harm Risks: pt denies Triggers for Past Attempts: None known Intentional Self Injurious Behavior: Damaging Comment - Self Injurious Behavior: Ongoing SA Family Suicide History:  No Recent stressful life event(s): Conflict (Comment);Job Loss;Financial Problems (No job, lives with parents, SA, relationship issues) Persecutory voices/beliefs?: No Depression: Yes Depression Symptoms: Despondent;Insomnia;Tearfulness;Guilt;Loss of interest in usual pleasures;Feeling worthless/self pity;Feeling angry/irritable Substance abuse history and/or treatment for substance abuse?: No Suicide prevention information given to non-admitted patients: Yes  Risk to Others Homicidal Ideation: No Thoughts of Harm to Others: No Current Homicidal Intent: No Current Homicidal Plan: No Access to Homicidal Means: No Identified Victim: pt denies History of harm to others?: No Assessment of Violence: None Noted Violent Behavior Description: na - pt calm, cooperative Does patient have access to weapons?: No Criminal Charges Pending?: No Does patient have a court date: No  Psychosis Hallucinations: None noted Delusions: None noted  Mental Status Report Appear/Hygiene: Disheveled Eye Contact: Fair Motor Activity: Restlessness Speech: Logical/coherent;Slurred Level of Consciousness: Alert;Restless Mood: Depressed;Anxious;Ashamed/humiliated;Helpless Affect: Anxious;Depressed;Appropriate to circumstance Anxiety Level: Moderate Thought Processes: Coherent;Relevant Judgement: Impaired Orientation: Person;Place;Time;Situation Obsessive Compulsive Thoughts/Behaviors: None  Cognitive Functioning Concentration: Decreased Memory: Recent Intact;Remote Intact IQ: Average Insight: Poor Impulse Control: Poor Appetite: Poor Weight Loss: 0 Weight Gain: 0 Sleep: Decreased Total Hours of Sleep:  (  6-7 hrs per night when has drugs) Vegetative Symptoms: None  ADLScreening [facility] Assessment Services) Patient's cognitive ability adequate to safely complete daily activities?: Yes Patient able to express need for assistance with ADLs?: Yes Independently performs ADLs?: Yes (appropriate for  developmental age)  Abuse/Neglect [facility]) Physical Abuse: Denies Verbal Abuse: Denies Sexual Abuse: Denies  Prior Inpatient Therapy Prior Inpatient Therapy: No Prior Therapy Dates: na Prior Therapy Facilty/Provider(s): na Reason for Treatment: na  Prior Outpatient Therapy Prior Outpatient Therapy: No Prior Therapy Dates: na Prior Therapy Facilty/Provider(s): na Reason for Treatment: na  ADL Screening (condition at time of admission) Patient's cognitive ability adequate to safely complete daily activities?: Yes Patient able to express need for assistance with ADLs?: Yes Independently performs ADLs?: Yes (appropriate for developmental age)  Home Assistive Devices/Equipment Home Assistive Devices/Equipment: None    Abuse/Neglect Assessment (Assessment to be complete while patient is alone) Physical Abuse: Denies Verbal Abuse: Denies Sexual Abuse: Denies Exploitation of patient/patient's resources: Denies Self-Neglect: Denies Values / Beliefs Cultural Requests During Hospitalization: None Spiritual Requests During Hospitalization: None Consults Spiritual Care Consult Needed: No Social Work Consult Needed: No Regulatory affairs officer (For Healthcare) Advance Directive: Patient does not have advance directive;Patient would not like information    Additional Information 1:1 In Past 12 Months?: No CIRT Risk: No Elopement Risk: Yes Does patient have medical clearance?: Yes     Disposition:  Disposition Initial Assessment Completed for this Encounter: Yes Disposition of Patient: Referred to;Inpatient treatment program Type of inpatient treatment program: Adult Patient referred to: [facility]  On Site Evaluation by:   Reviewed with Physician:  PA Mechele Claude, Lorayne Bender 04/04/2013 5:18 PM

## 2013-04-04 NOTE — ED Provider Notes (Signed)
History/physical exam/procedure(s) were performed by non-physician practitioner and as supervising physician I was immediately available for consultation/collaboration. I have reviewed all notes and am in agreement with care and plan.   Shaune Pollack, MD 04/04/13 1816

## 2013-04-04 NOTE — ED Notes (Signed)
Took off nicotine patch and refused to take Ativan. Encouraged to take meds. Refused.

## 2013-04-04 NOTE — BH Assessment (Signed)
[place] Assessment Progress Note   Sandy at [facility] said that they would take patient for detox treatment.  Clinician called [facility] and spoke to [facility] who gave authorization for 3 days detox tx.  This information was conveyed to [place] at [facility].  She said that patient can come after 22:30 tonight.  Patient has contacted his girlfriend who will transport him to [facility] tonight.  Nursing staff know that they are to call [facility] at [phone] to give report as soon as patient leaves.  [facility] will not admit patient if it takes more than 60 minutes for him to get to their facility from [facility].  EDP was made aware of pt acceptance to [facility].

## 2013-04-04 NOTE — ED Notes (Signed)
Called [facility] to let them know Pt. Just left this facility and was on the way there.

## 2013-04-04 NOTE — ED Notes (Signed)
Patient states have been using opiates since 2006 orally or crushing pills and snorting them along with smoking marijuana and drinking alcohol.  Patient denies SI or HI states is depressed and had SI 4-5 months ago.  Girlfriend at bedside who together has a 41 month old child. Tried to detox by himself in the past. First time seeking professional in patient treatment. Girlfriend stated patient is now taking money from her wallet and his own Grandmother. Patient unable to keep a job due to calling out because patient does not feel well.

## 2013-04-04 NOTE — ED Notes (Signed)
Pt requesting detox from opiod pain meds that he either takes PO or snorts intermittently since 2006; pt denies SI/HI; pt sts hx of depression and is tearful

## 2013-04-04 NOTE — ED Notes (Signed)
Pt returned to room on own- explained to pt that if he leaves this department without our knowledge, he possibly could be discharged. Significant other at bedside-- is leaving to go home. She stated that if pt doesn't get clean, he has no where to go--currently they are living with his parents and 53 month old baby. Pt has allegedly been taking money from her, his parents, and grandparents, according to significant other.

## 2013-04-04 NOTE — ED Notes (Signed)
Pt here voluntarily with request for inpatient treatment for opiate addiction. States has been addicted to opiates since having 4 surgeries from 2006-2009. Also uses marijuana. Has used cocaine in past. States " I know that I won't be able to do this on my own. I tried it before and was sick. Then I substitute other things for the pills. I have been using Opana and Dilauda the most".

## 2013-04-04 NOTE — ED Provider Notes (Signed)
History     CSN: [id number]  Arrival date & time 04/04/13  1341   First MD Initiated Contact with Patient 04/04/13 1420      Chief Complaint  Patient presents with  . Medical Clearance  . Addiction Problem    (Consider location/radiation/quality/duration/timing/severity/associated sxs/prior treatment) HPI Comments: 27 y.o. Male with PMHx of narcotic abuse presents today requesting detox from opiates and benzos with an inpatient treatment program. Pt states he took three Percocets (10/325) today and smoked marijuana and took a 1mg  clonopine yesterday. Pt denies SI/HI/audio/visual hallucinations today, but states he did throw a rope over a tree to hang himself about a month ago. Police intervened responding to a public disturbance before he was able to attempt to hurt himself. Pt states that he has tried to quit "cold [place]" by himself, nursing his withdrawal symptoms with benzos and marijuana, but returns to using as soon as opiates like Percocet are presented to him by acquaintances. Pt also actively seeks prescription drugs from doctors and was at the ED as recently as 4 and 6 days ago, complaining of what presents like carpal tunnel, but in reality he states he was hoping to "score some pain meds." Pt has an infant son and a fiancee and would like help getting his life on track. He does not feel he can do this alone. Pt has no somatic complaints today.    Past Medical History  Diagnosis Date  . Narcotic abuse     Past Surgical History  Procedure Laterality Date  . Hand surgery  2006    History reviewed. No pertinent family history.  History  Substance Use Topics  . Smoking status: Current Every Day Smoker -- 0.50 packs/day    Types: Cigarettes  . Smokeless tobacco: Not on file  . Alcohol Use: No      Review of Systems  Constitutional: Negative for fever and diaphoresis.  HENT: Negative for neck pain and neck stiffness.   Eyes: Negative for visual disturbance.   Respiratory: Negative for apnea, chest tightness and shortness of breath.   Cardiovascular: Negative for chest pain and palpitations.  Gastrointestinal: Negative for nausea, vomiting, diarrhea and constipation.  Genitourinary: Negative for dysuria.  Musculoskeletal: Negative for gait problem.  Skin: Negative for rash.  Neurological: Negative for dizziness, weakness, light-headedness, numbness and headaches.    Allergies  Review of patient's allergies indicates no known allergies.  Home Medications   Current Outpatient Rx  Name  Route  Sig  Dispense  Refill  . acetaminophen (TYLENOL) 500 MG tablet   Oral   Take 1,000 mg by mouth every 6 (six) hours as needed for pain.         Marland Kitchen oxyCODONE-acetaminophen (PERCOCET/ROXICET) 5-325 MG per tablet   Oral   Take 1-2 tablets by mouth every 6 (six) hours as needed for pain.   10 tablet   0     BP 139/89  Pulse 82  Temp(Src) 98.5 F (36.9 C) (Oral)  Resp 18  SpO2 95%  Physical Exam  Nursing note and vitals reviewed. Constitutional: He is oriented to person, place, and time. He appears well-developed and well-nourished. No distress.  HENT:  Head: Normocephalic and atraumatic.  Eyes: Conjunctivae and EOM are normal.  Neck: Normal range of motion. Neck supple.  No meningeal signs  Cardiovascular: Normal rate, regular rhythm and normal heart sounds.  Exam reveals no gallop and no friction rub.   No murmur heard. Pulmonary/Chest: Effort normal and breath sounds normal. No respiratory  distress. He has no wheezes. He has no rales. He exhibits no tenderness.  Abdominal: Soft. Bowel sounds are normal. He exhibits no distension. There is no tenderness. There is no rebound and no guarding.  Musculoskeletal: Normal range of motion. He exhibits no edema and no tenderness.  FROM to upper and lower extremities  Neurological: He is alert and oriented to person, place, and time. No cranial nerve deficit.  Speech is clear and goal oriented,  follows commands Sensation normal to light touch Moves extremities without ataxia, coordination intact Normal gait and balance Normal strength in upper and lower extremities bilaterally including dorsiflexion and plantar flexion, strong and equal grip strength   Skin: Skin is warm and dry. He is not diaphoretic. No erythema.  Psychiatric:  Dysphoric, tearful    ED Course  Procedures (including critical care time)  Labs Reviewed  ACETAMINOPHEN LEVEL  CBC  COMPREHENSIVE METABOLIC PANEL  ETHANOL  SALICYLATE LEVEL  URINE RAPID DRUG SCREEN (HOSP PERFORMED)   No results found.   No diagnosis found.    MDM  Pt presents to the ED for medical clearance.  Pt is not currently having SI or HI ideations, although he states he has attempted to hang himself about a month ago (this attempt was interrupted by police before he could actually attempt). Pt is tearful when he discusses his polysubstance abuse that includes marijuana (smoked yesterday), benzos (1mg  clonopine taken yesterday), and Percocets (three 10/325 taken today).  The patient currently does not have any acute physical complaints and is in no acute distress. The patients demeanor is dysphoric. The patient was brought to ED by self an is seeking voluntarily  placement/behavioral health assistance.   Kristen from [facility] [phone]) called and will await medical clearance (i.e. Lab results) before further action is taken. Pt care turned over to Glendell Docker, NP at change of shift. Pt transferred to [facility] awaiting lab results.     Coralee North, PA-C 04/04/13 1519

## 2014-05-02 ENCOUNTER — Emergency Department (HOSPITAL_COMMUNITY): Payer: Self-pay

## 2014-05-02 ENCOUNTER — Encounter (HOSPITAL_COMMUNITY): Payer: Self-pay | Admitting: Emergency Medicine

## 2014-05-02 ENCOUNTER — Emergency Department (HOSPITAL_COMMUNITY)
Admission: EM | Admit: 2014-05-02 | Discharge: 2014-05-02 | Disposition: A | Discharge status: Home / Self Care | Payer: Self-pay | Attending: Emergency Medicine | Admitting: Emergency Medicine

## 2014-05-02 DIAGNOSIS — F17200 Nicotine dependence, unspecified, uncomplicated: Secondary | ICD-10-CM | POA: Insufficient documentation

## 2014-05-02 DIAGNOSIS — IMO0002 Reserved for concepts with insufficient information to code with codable children: Secondary | ICD-10-CM | POA: Insufficient documentation

## 2014-05-02 DIAGNOSIS — S4980XA Other specified injuries of shoulder and upper arm, unspecified arm, initial encounter: Secondary | ICD-10-CM | POA: Insufficient documentation

## 2014-05-02 DIAGNOSIS — S0181XA Laceration without foreign body of other part of head, initial encounter: Secondary | ICD-10-CM

## 2014-05-02 DIAGNOSIS — S1093XA Contusion of unspecified part of neck, initial encounter: Secondary | ICD-10-CM

## 2014-05-02 DIAGNOSIS — S0180XA Unspecified open wound of other part of head, initial encounter: Secondary | ICD-10-CM | POA: Insufficient documentation

## 2014-05-02 DIAGNOSIS — R404 Transient alteration of awareness: Secondary | ICD-10-CM | POA: Insufficient documentation

## 2014-05-02 DIAGNOSIS — Y9351 Activity, roller skating (inline) and skateboarding: Secondary | ICD-10-CM | POA: Insufficient documentation

## 2014-05-02 DIAGNOSIS — S46909A Unspecified injury of unspecified muscle, fascia and tendon at shoulder and upper arm level, unspecified arm, initial encounter: Secondary | ICD-10-CM | POA: Insufficient documentation

## 2014-05-02 DIAGNOSIS — Y9289 Other specified places as the place of occurrence of the external cause: Secondary | ICD-10-CM | POA: Insufficient documentation

## 2014-05-02 DIAGNOSIS — S0083XA Contusion of other part of head, initial encounter: Secondary | ICD-10-CM | POA: Insufficient documentation

## 2014-05-02 DIAGNOSIS — S0003XA Contusion of scalp, initial encounter: Secondary | ICD-10-CM | POA: Insufficient documentation

## 2014-05-02 LAB — COMPREHENSIVE METABOLIC PANEL
ALK PHOS: 63 U/L (ref 39–117)
ALT: 10 U/L (ref 0–53)
AST: 19 U/L (ref 0–37)
Albumin: 4.1 g/dL (ref 3.5–5.2)
Anion gap: 14 (ref 5–15)
BUN: 14 mg/dL (ref 6–23)
CO2: 25 mEq/L (ref 19–32)
Calcium: 9.3 mg/dL (ref 8.4–10.5)
Chloride: 100 mEq/L (ref 96–112)
Creatinine, Ser: 1.11 mg/dL (ref 0.50–1.35)
GFR calc non Af Amer: 88 mL/min — ABNORMAL LOW (ref >90)
GLUCOSE: 109 mg/dL — AB (ref 70–99)
POTASSIUM: 4.1 meq/L (ref 3.7–5.3)
SODIUM: 139 meq/L (ref 137–147)
Total Bilirubin: 0.3 mg/dL (ref 0.3–1.2)
Total Protein: 7.4 g/dL (ref 6.0–8.3)

## 2014-05-02 LAB — CBC WITH DIFFERENTIAL/PLATELET
BASOS ABS: 0 10*3/uL (ref 0.0–0.1)
Basophils Relative: 0 % (ref 0–1)
EOS ABS: 0 10*3/uL (ref 0.0–0.7)
Eosinophils Relative: 0 % (ref 0–5)
HCT: 43.5 % (ref 39.0–52.0)
Hemoglobin: 14.7 g/dL (ref 13.0–17.0)
LYMPHS ABS: 1.1 10*3/uL (ref 0.7–4.0)
LYMPHS PCT: 11 % — AB (ref 12–46)
MCH: 31.2 pg (ref 26.0–34.0)
MCHC: 33.8 g/dL (ref 30.0–36.0)
MCV: 92.4 fL (ref 78.0–100.0)
Monocytes Absolute: 0.7 10*3/uL (ref 0.1–1.0)
Monocytes Relative: 7 % (ref 3–12)
NEUTROS PCT: 82 % — AB (ref 43–77)
Neutro Abs: 8.2 10*3/uL — ABNORMAL HIGH (ref 1.7–7.7)
PLATELETS: 201 10*3/uL (ref 150–400)
RBC: 4.71 MIL/uL (ref 4.22–5.81)
RDW: 13.8 % (ref 11.5–15.5)
WBC: 10.1 10*3/uL (ref 4.0–10.5)

## 2014-05-02 MED ORDER — HYDROCODONE-ACETAMINOPHEN 5-325 MG PO TABS: 1.0000 | ORAL_TABLET | ORAL | Status: DC | PRN

## 2014-05-02 MED ORDER — SODIUM CHLORIDE 0.9 % IV BOLUS (SEPSIS)
1000.0000 mL | Freq: Once | INTRAVENOUS | Status: AC
  Administered 2014-05-02: 1000 mL via INTRAVENOUS

## 2014-05-02 MED ORDER — HYDROCODONE-ACETAMINOPHEN 5-325 MG PO TABS
2.0000 | ORAL_TABLET | Freq: Once | ORAL | Status: AC
  Administered 2014-05-02: 2 via ORAL
  Filled 2014-05-02: qty 2

## 2014-05-02 MED ORDER — HYDROMORPHONE HCL PF 1 MG/ML IJ SOLN
0.5000 mg | Freq: Once | INTRAMUSCULAR | Status: AC
  Administered 2014-05-02: 0.5 mg via INTRAVENOUS
  Filled 2014-05-02: qty 1

## 2014-05-02 MED ORDER — ONDANSETRON HCL 4 MG/2ML IJ SOLN
4.0000 mg | Freq: Once | INTRAMUSCULAR | Status: AC
  Administered 2014-05-02: 4 mg via INTRAVENOUS
  Filled 2014-05-02: qty 2

## 2014-05-02 NOTE — ED Notes (Signed)
Suture cart placed at bedside. 

## 2014-05-02 NOTE — ED Provider Notes (Signed)
Presents after he fell off skateboard nearly prior to coming here. Complains of pain at left base behind left ear and laceration above the right eye. On exam patient is alert Glasgow Coma Score 15 HEENT exam there is soft tissue swelling of left-sided scalp immediately behind left ear. No Battle sign. External ears normal bilaterally. There is a linear laceration above the right eye at the eyebrow. Parallel to the eyebrow. Otherwise normocephalic atraumatic neck without step-off or point tenderness. Lungs clear to sounds heart regular rate and abdomen nondistended nontender. Neurologic Glasgow Coma Score 15 motor strength 5 over 5 overall pain is 2 through 12 grossly intact  Orlie Dakin, MD 05/02/14 1713

## 2014-05-02 NOTE — ED Notes (Signed)
Pt informed to take ibuprofen mild pain instead of vicodin per Dr. Silvio Clayman.

## 2014-05-02 NOTE — ED Notes (Signed)
Pt to ED via EMS for fall. Pt was scape boarding and fall with LOC. Laceration at right eye, left ear swelling noted. Pt c/o left hip, shoulder and back pain. Pt alerts and oriented at arrival. Per EMS, BP-124/79, pulse-74, RR-18, SpO2-98% on room air. EMS reports scape board not found.

## 2014-05-02 NOTE — Discharge Instructions (Signed)
Facial Laceration  A facial laceration is a cut on the face. These injuries can be painful and cause bleeding. Lacerations usually heal quickly, but they need special care to reduce scarring. DIAGNOSIS  Your health care provider will take a medical history, ask for details about how the injury occurred, and examine the wound to determine how deep the cut is. TREATMENT  Some facial lacerations may not require closure. Others may not be able to be closed because of an increased risk of infection. The risk of infection and the chance for successful closure will depend on various factors, including the amount of time since the injury occurred. The wound may be cleaned to help prevent infection. If closure is appropriate, pain medicines may be given if needed. Your health care provider will use stitches (sutures), wound glue (adhesive), or skin adhesive strips to repair the laceration. These tools bring the skin edges together to allow for faster healing and a better cosmetic outcome. If needed, you may also be given a tetanus shot. HOME CARE INSTRUCTIONS  Only take over-the-counter or prescription medicines as directed by your health care provider.  Follow your health care provider's instructions for wound care. These instructions will vary depending on the technique used for closing the wound. For Sutures:  Keep the wound clean and dry.   If you were given a bandage (dressing), you should change it at least once a day. Also change the dressing if it becomes wet or dirty, or as directed by your health care provider.   Wash the wound with soap and water 2 times a day. Rinse the wound off with water to remove all soap. Pat the wound dry with a clean towel.   After cleaning, apply a thin layer of the antibiotic ointment recommended by your health care provider. This will help prevent infection and keep the dressing from sticking.   You may shower as usual after the first 24 hours. Do not soak the  wound in water until the sutures are removed.   Get your sutures removed as directed by your health care provider. With facial lacerations, sutures should usually be taken out after 4-5 days to avoid stitch marks.   Wait a few days after your sutures are removed before applying any makeup. For Skin Adhesive Strips:  Keep the wound clean and dry.   Do not get the skin adhesive strips wet. You may bathe carefully, using caution to keep the wound dry.   If the wound gets wet, pat it dry with a clean towel.   Skin adhesive strips will fall off on their own. You may trim the strips as the wound heals. Do not remove skin adhesive strips that are still stuck to the wound. They will fall off in time.  For Wound Adhesive:  You may briefly wet your wound in the shower or bath. Do not soak or scrub the wound. Do not swim. Avoid periods of heavy sweating until the skin adhesive has fallen off on its own. After showering or bathing, gently pat the wound dry with a clean towel.   Do not apply liquid medicine, cream medicine, ointment medicine, or makeup to your wound while the skin adhesive is in place. This may loosen the film before your wound is healed.   If a dressing is placed over the wound, be careful not to apply tape directly over the skin adhesive. This may cause the adhesive to be pulled off before the wound is healed.   Avoid   prolonged exposure to sunlight or tanning lamps while the skin adhesive is in place.  The skin adhesive will usually remain in place for 5-10 days, then naturally fall off the skin. Do not pick at the adhesive film.  After Healing: Once the wound has healed, cover the wound with sunscreen during the day for 1 full year. This can help minimize scarring. Exposure to ultraviolet light in the first year will darken the scar. It can take 1-2 years for the scar to lose its redness and to heal completely.  SEEK IMMEDIATE MEDICAL CARE IF:  You have redness, pain, or  swelling around the wound.   You see ayellowish-white fluid (pus) coming from the wound.   You have chills or a fever.  MAKE SURE YOU:  Understand these instructions.  Will watch your condition.  Will get help right away if you are not doing well or get worse. Document Released: 11/20/2004 Document Revised: 08/03/2013 Document Reviewed: 05/26/2013 [employer] Patient Information 2015 [employer]. This information is not intended to replace advice given to you by your health care provider. Make sure you discuss any questions you have with your health care provider.  

## 2014-05-02 NOTE — ED Provider Notes (Signed)
CSN: [id number]     Arrival date & time 05/02/14  1527 History   First MD Initiated Contact with Patient 05/02/14 1537     Chief Complaint  Patient presents with  . Fall     (Consider location/radiation/quality/duration/timing/severity/associated sxs/prior Treatment) Patient is a 28 y.o. male presenting with fall and trauma.  Fall This is a new problem. Pertinent negatives include no abdominal pain, chest pain, diaphoresis, nausea, neck pain, numbness, vomiting or weakness.  Trauma Mechanism of injury: skateboarding accident and fall Injury location: head/neck, face and shoulder/arm Injury location detail: R eyebrow and L cheek and L shoulder Arrived directly from scene: yes   Fall:      Impact surface: concrete      Point of impact: head      Entrapped after fall: no      Suspicion of alcohol use: no      Suspicion of drug use: no  EMS/PTA data:      Bystander interventions: bystander C-spine precautions      Ambulatory at scene: no      Blood loss: minimal      Responsiveness: alert      Oriented to: person, place, situation and time      Loss of consciousness: yes      Amnesic to event: yes  Current symptoms:      Associated symptoms:            Reports loss of consciousness.            Denies abdominal pain, back pain, chest pain, difficulty breathing, nausea, neck pain and vomiting.    Past Medical History  Diagnosis Date  . Narcotic abuse    Past Surgical History  Procedure Laterality Date  . Hand surgery  2006   History reviewed. No pertinent family history. History  Substance Use Topics  . Smoking status: Current Every Day Smoker -- 0.50 packs/day    Types: Cigarettes  . Smokeless tobacco: Not on file  . Alcohol Use: No    Review of Systems  Constitutional: Negative for diaphoresis.  HENT: Negative for dental problem, facial swelling and trouble swallowing.   Eyes: Positive for pain and redness.  Respiratory: Negative for chest tightness.    Cardiovascular: Negative for chest pain.  Gastrointestinal: Negative for nausea, vomiting and abdominal pain.  Genitourinary: Negative for penile pain and testicular pain.  Musculoskeletal: Negative for back pain and neck pain.  Skin: Positive for wound.  Neurological: Positive for loss of consciousness. Negative for syncope, weakness, light-headedness and numbness.       Loc      Allergies  Review of patient's allergies indicates no known allergies.  Home Medications   Prior to Admission medications   Medication Sig Start Date End Date Taking? Authorizing Provider  HYDROcodone-acetaminophen (NORCO/VICODIN) 5-325 MG per tablet Take 1 tablet by mouth every 4 (four) hours as needed. 05/02/14   Freddi Che, MD   BP 122/74  Pulse 59  Temp(Src) 98.6 F (37 C)  Resp 12  Ht 5\' 8"  (1.727 m)  Wt 155 lb (70.308 kg)  BMI 23.57 kg/m2  SpO2 99% Physical Exam  Constitutional: He is oriented to person, place, and time. He appears well-developed and well-nourished. No distress.  HENT:  Head: Normocephalic. Head is with contusion and with laceration (Laceration to R forehead). Head is without Battle's sign (L sided facial swelling).  Eyes: Pupils are equal, round, and reactive to light.  Neck: Normal range of motion.  Cardiovascular: Normal  rate and regular rhythm.   Pulmonary/Chest: Effort normal and breath sounds normal. No respiratory distress. He exhibits no bony tenderness.  Abdominal: Soft. He exhibits no distension. There is no tenderness.  Musculoskeletal: Normal range of motion.       Cervical back: He exhibits tenderness and bony tenderness. He exhibits no deformity.       Thoracic back: He exhibits no bony tenderness and no deformity.       Lumbar back: He exhibits no bony tenderness and no deformity.  Neurological: He is alert and oriented to person, place, and time.  Skin: Skin is warm. He is not diaphoretic.    ED Course  LACERATION REPAIR Date/Time: 05/03/2014 12:40  AM Performed by: Freddi Che Authorized by: Orlie Dakin Consent: Verbal consent obtained. Body area: head/neck Location details: forehead Laceration length: 1.5 cm Anesthesia: local infiltration Local anesthetic: lidocaine 1% with epinephrine Anesthetic total: 3 ml Preparation: Patient was prepped and draped in the usual sterile fashion. Irrigation solution: saline Irrigation method: syringe Amount of cleaning: standard Skin closure: 6-0 Prolene Number of sutures: 3 Technique: simple Approximation: close Approximation difficulty: simple   (including critical care time) Labs Review Labs Reviewed  CBC WITH DIFFERENTIAL - Abnormal; Notable for the following:    Neutrophils Relative % 82 (*)    Neutro Abs 8.2 (*)    Lymphocytes Relative 11 (*)    All other components within normal limits  COMPREHENSIVE METABOLIC PANEL - Abnormal; Notable for the following:    Glucose, Bld 109 (*)    GFR calc non Af Amer 88 (*)    All other components within normal limits    Imaging Review Dg Chest 1 View  05/02/2014   CLINICAL DATA:  Patient fell off skateboard  EXAM: CHEST - 1 VIEW  COMPARISON:  None.  FINDINGS: The heart size and mediastinal contours are within normal limits. Both lungs are clear. The visualized skeletal structures are unremarkable.  IMPRESSION: No active disease.   Electronically Signed   By: Kathreen Devoid   On: 05/02/2014 16:53   Dg Pelvis 1-2 Views  05/02/2014   CLINICAL DATA:  Status post fall.  Pain.  EXAM: PELVIS - 1-2 VIEW  COMPARISON:  None.  FINDINGS: Imaged bones, joints and soft tissues appear normal.  IMPRESSION: Negative exam.   Electronically Signed   By: Inge Rise M.D.   On: 05/02/2014 16:54   Ct Head Wo Contrast  05/02/2014   CLINICAL DATA:  Fall  EXAM: CT HEAD WITHOUT CONTRAST  CT MAXILLOFACIAL WITHOUT CONTRAST  CT CERVICAL SPINE WITHOUT CONTRAST  TECHNIQUE: Multidetector CT imaging of the head, cervical spine, and maxillofacial structures were  performed using the standard protocol without intravenous contrast. Multiplanar CT image reconstructions of the cervical spine and maxillofacial structures were also generated.  COMPARISON:  None.  FINDINGS: CT HEAD FINDINGS  There is no evidence of mass effect, midline shift or extra-axial fluid collections. There is no evidence of a space-occupying lesion or intracranial hemorrhage. There is no evidence of a cortical-based area of acute infarction.  The ventricles and sulci are appropriate for the patient's age. The basal cisterns are patent.  Visualized portions of the orbits are unremarkable. The visualized portions of the paranasal sinuses and mastoid air cells are unremarkable.  The osseous structures are unremarkable. Right frontal scalp/supraorbital hematoma.  CT MAXILLOFACIAL FINDINGS  Right frontal scalp/supraorbital hematoma. The globes are intact. The orbital walls are intact. The orbital floors are intact. The maxilla is intact. The mandible is  intact. The zygomatic arches are intact. The nasal septum is midline. There is no nasal bone fracture. The temporomandibular joints are normal.  There is a dental caries involving the left lower first molar.  The paranasal sinuses are clear. The visualized portions of the mastoid sinuses are well aerated.  CT CERVICAL SPINE FINDINGS  The alignment is anatomic. The vertebral body heights are maintained. Incidental note is made of incomplete fusion of the posterior arch of C1. There is no acute fracture. There is no static listhesis. The prevertebral soft tissues are normal. The intraspinal soft tissues are not fully imaged on this examination due to poor soft tissue contrast, but there is no gross soft tissue abnormality.  The disc spaces are maintained.  The visualized portions of the lung apices demonstrate no focal abnormality.  IMPRESSION: 1. No acute intracranial pathology. 2. No acute osseous injury of the maxillofacial bones. 3. No acute osseous injury of  the cervical spine. 4. Dental caries involving the left lower first molar.   Electronically Signed   By: Kathreen Devoid   On: 05/02/2014 16:28   Ct Cervical Spine Wo Contrast  05/02/2014   CLINICAL DATA:  Fall  EXAM: CT HEAD WITHOUT CONTRAST  CT MAXILLOFACIAL WITHOUT CONTRAST  CT CERVICAL SPINE WITHOUT CONTRAST  TECHNIQUE: Multidetector CT imaging of the head, cervical spine, and maxillofacial structures were performed using the standard protocol without intravenous contrast. Multiplanar CT image reconstructions of the cervical spine and maxillofacial structures were also generated.  COMPARISON:  None.  FINDINGS: CT HEAD FINDINGS  There is no evidence of mass effect, midline shift or extra-axial fluid collections. There is no evidence of a space-occupying lesion or intracranial hemorrhage. There is no evidence of a cortical-based area of acute infarction.  The ventricles and sulci are appropriate for the patient's age. The basal cisterns are patent.  Visualized portions of the orbits are unremarkable. The visualized portions of the paranasal sinuses and mastoid air cells are unremarkable.  The osseous structures are unremarkable. Right frontal scalp/supraorbital hematoma.  CT MAXILLOFACIAL FINDINGS  Right frontal scalp/supraorbital hematoma. The globes are intact. The orbital walls are intact. The orbital floors are intact. The maxilla is intact. The mandible is intact. The zygomatic arches are intact. The nasal septum is midline. There is no nasal bone fracture. The temporomandibular joints are normal.  There is a dental caries involving the left lower first molar.  The paranasal sinuses are clear. The visualized portions of the mastoid sinuses are well aerated.  CT CERVICAL SPINE FINDINGS  The alignment is anatomic. The vertebral body heights are maintained. Incidental note is made of incomplete fusion of the posterior arch of C1. There is no acute fracture. There is no static listhesis. The prevertebral soft  tissues are normal. The intraspinal soft tissues are not fully imaged on this examination due to poor soft tissue contrast, but there is no gross soft tissue abnormality.  The disc spaces are maintained.  The visualized portions of the lung apices demonstrate no focal abnormality.  IMPRESSION: 1. No acute intracranial pathology. 2. No acute osseous injury of the maxillofacial bones. 3. No acute osseous injury of the cervical spine. 4. Dental caries involving the left lower first molar.   Electronically Signed   By: Kathreen Devoid   On: 05/02/2014 16:28   Dg Shoulder Left  05/02/2014   CLINICAL DATA:  Patient fell off a skateboard, left shoulder pain  EXAM: LEFT SHOULDER - 2+ VIEW  COMPARISON:  None.  FINDINGS: There  is no evidence of fracture or dislocation. There is no evidence of arthropathy or other focal bone abnormality. Soft tissues are unremarkable.  IMPRESSION: Negative.   Electronically Signed   By: Kathreen Devoid   On: 05/02/2014 16:54   Ct Maxillofacial Wo Cm  05/02/2014   CLINICAL DATA:  Fall  EXAM: CT HEAD WITHOUT CONTRAST  CT MAXILLOFACIAL WITHOUT CONTRAST  CT CERVICAL SPINE WITHOUT CONTRAST  TECHNIQUE: Multidetector CT imaging of the head, cervical spine, and maxillofacial structures were performed using the standard protocol without intravenous contrast. Multiplanar CT image reconstructions of the cervical spine and maxillofacial structures were also generated.  COMPARISON:  None.  FINDINGS: CT HEAD FINDINGS  There is no evidence of mass effect, midline shift or extra-axial fluid collections. There is no evidence of a space-occupying lesion or intracranial hemorrhage. There is no evidence of a cortical-based area of acute infarction.  The ventricles and sulci are appropriate for the patient's age. The basal cisterns are patent.  Visualized portions of the orbits are unremarkable. The visualized portions of the paranasal sinuses and mastoid air cells are unremarkable.  The osseous structures are  unremarkable. Right frontal scalp/supraorbital hematoma.  CT MAXILLOFACIAL FINDINGS  Right frontal scalp/supraorbital hematoma. The globes are intact. The orbital walls are intact. The orbital floors are intact. The maxilla is intact. The mandible is intact. The zygomatic arches are intact. The nasal septum is midline. There is no nasal bone fracture. The temporomandibular joints are normal.  There is a dental caries involving the left lower first molar.  The paranasal sinuses are clear. The visualized portions of the mastoid sinuses are well aerated.  CT CERVICAL SPINE FINDINGS  The alignment is anatomic. The vertebral body heights are maintained. Incidental note is made of incomplete fusion of the posterior arch of C1. There is no acute fracture. There is no static listhesis. The prevertebral soft tissues are normal. The intraspinal soft tissues are not fully imaged on this examination due to poor soft tissue contrast, but there is no gross soft tissue abnormality.  The disc spaces are maintained.  The visualized portions of the lung apices demonstrate no focal abnormality.  IMPRESSION: 1. No acute intracranial pathology. 2. No acute osseous injury of the maxillofacial bones. 3. No acute osseous injury of the cervical spine. 4. Dental caries involving the left lower first molar.   Electronically Signed   By: Kathreen Devoid   On: 05/02/2014 16:28     EKG Interpretation None      MDM   Final diagnoses:  Facial laceration, initial encounter   Brian [name] is a 28 y.o. male who presents to the ED with trauma 2/2 skateboarding accident.   Upon arrival, patient with physical exam as above. Airway intact. Breathing: CTAB. Circulation: 1 IVs established, manual BP as above. CXR performed. Secondary performed, PE significant for the following: head laceration, neck pain.   Patient stabilized prior to transfer to CT scanning. CT results as above, significant for no acute abnormalities excepting soft  tissue swelling. Patient with laceration to his right forehead. Laceration repair as above with 3 sutures. C-collar was cleared by the attending physician. After trauma scans were done, the patient was reevaluated still complaining of mild headache. Patient has significant swelling and laceration to the right side of his head and will be discharged with a short course of narcotic pain medications or breakthrough pain with recommendation to take NSAIDs for continued pain relief. Patient voices understanding. All questions were answered. Strict return precautions  were given. Patient was discharged in stable condition. Patient seen and evaluated by myself and my attending, Dr. Winfred Leeds.      Freddi Che, MD 05/03/14 [phone]

## 2014-05-03 NOTE — ED Provider Notes (Signed)
I have personally seen and examined the patient.  I have discussed the plan of care with the resident.  I have reviewed the documentation on PMH/FH/Soc. History.  I have reviewed the documentation of the resident and agree.  Orlie Dakin, MD 05/03/14 [phone]

## 2014-05-27 ENCOUNTER — Encounter (HOSPITAL_COMMUNITY): Payer: Self-pay | Admitting: Emergency Medicine

## 2014-05-27 ENCOUNTER — Emergency Department (HOSPITAL_COMMUNITY): Payer: Self-pay

## 2014-05-27 ENCOUNTER — Inpatient Hospital Stay (HOSPITAL_COMMUNITY)
Admission: EM | Admit: 2014-05-27 | Discharge: 2014-05-29 | DRG: 389 | Disposition: A | Discharge status: Home / Self Care | Payer: Self-pay | Attending: Internal Medicine | Admitting: Internal Medicine

## 2014-05-27 DIAGNOSIS — K209 Esophagitis, unspecified without bleeding: Secondary | ICD-10-CM | POA: Diagnosis present

## 2014-05-27 DIAGNOSIS — F1010 Alcohol abuse, uncomplicated: Secondary | ICD-10-CM | POA: Diagnosis present

## 2014-05-27 DIAGNOSIS — K598 Other specified functional intestinal disorders: Secondary | ICD-10-CM

## 2014-05-27 DIAGNOSIS — K920 Hematemesis: Secondary | ICD-10-CM | POA: Diagnosis present

## 2014-05-27 DIAGNOSIS — R1011 Right upper quadrant pain: Secondary | ICD-10-CM

## 2014-05-27 DIAGNOSIS — F1910 Other psychoactive substance abuse, uncomplicated: Secondary | ICD-10-CM | POA: Diagnosis present

## 2014-05-27 DIAGNOSIS — K5989 Other specified functional intestinal disorders: Secondary | ICD-10-CM | POA: Diagnosis present

## 2014-05-27 DIAGNOSIS — R109 Unspecified abdominal pain: Secondary | ICD-10-CM | POA: Diagnosis present

## 2014-05-27 DIAGNOSIS — K560 Paralytic ileus: Principal | ICD-10-CM | POA: Diagnosis present

## 2014-05-27 DIAGNOSIS — N179 Acute kidney failure, unspecified: Secondary | ICD-10-CM | POA: Diagnosis present

## 2014-05-27 DIAGNOSIS — R1110 Vomiting, unspecified: Secondary | ICD-10-CM

## 2014-05-27 DIAGNOSIS — F1210 Cannabis abuse, uncomplicated: Secondary | ICD-10-CM | POA: Diagnosis present

## 2014-05-27 DIAGNOSIS — K567 Ileus, unspecified: Secondary | ICD-10-CM | POA: Diagnosis present

## 2014-05-27 DIAGNOSIS — F17200 Nicotine dependence, unspecified, uncomplicated: Secondary | ICD-10-CM | POA: Diagnosis present

## 2014-05-27 HISTORY — DX: Peptic ulcer, site unspecified, unspecified as acute or chronic, without hemorrhage or perforation: K27.9

## 2014-05-27 LAB — CBC WITH DIFFERENTIAL/PLATELET
BASOS PCT: 0 % (ref 0–1)
Basophils Absolute: 0 10*3/uL (ref 0.0–0.1)
EOS ABS: 0 10*3/uL (ref 0.0–0.7)
EOS PCT: 0 % (ref 0–5)
HCT: 43.6 % (ref 39.0–52.0)
HEMOGLOBIN: 14.9 g/dL (ref 13.0–17.0)
LYMPHS ABS: 1.2 10*3/uL (ref 0.7–4.0)
Lymphocytes Relative: 11 % — ABNORMAL LOW (ref 12–46)
MCH: 31.2 pg (ref 26.0–34.0)
MCHC: 34.2 g/dL (ref 30.0–36.0)
MCV: 91.4 fL (ref 78.0–100.0)
MONOS PCT: 7 % (ref 3–12)
Monocytes Absolute: 0.8 10*3/uL (ref 0.1–1.0)
NEUTROS PCT: 82 % — AB (ref 43–77)
Neutro Abs: 9 10*3/uL — ABNORMAL HIGH (ref 1.7–7.7)
PLATELETS: 267 10*3/uL (ref 150–400)
RBC: 4.77 MIL/uL (ref 4.22–5.81)
RDW: 13.7 % (ref 11.5–15.5)
WBC: 11 10*3/uL — ABNORMAL HIGH (ref 4.0–10.5)

## 2014-05-27 LAB — COMPREHENSIVE METABOLIC PANEL
ALT: 22 U/L (ref 0–53)
AST: 28 U/L (ref 0–37)
Albumin: 4.4 g/dL (ref 3.5–5.2)
Alkaline Phosphatase: 71 U/L (ref 39–117)
Anion gap: 16 — ABNORMAL HIGH (ref 5–15)
BUN: 22 mg/dL (ref 6–23)
CALCIUM: 9.5 mg/dL (ref 8.4–10.5)
CHLORIDE: 95 meq/L — AB (ref 96–112)
CO2: 29 mEq/L (ref 19–32)
Creatinine, Ser: 1.44 mg/dL — ABNORMAL HIGH (ref 0.50–1.35)
GFR calc Af Amer: 75 mL/min — ABNORMAL LOW (ref >90)
GFR, EST NON AFRICAN AMERICAN: 65 mL/min — AB (ref >90)
Glucose, Bld: 103 mg/dL — ABNORMAL HIGH (ref 70–99)
Potassium: 3.7 mEq/L (ref 3.7–5.3)
SODIUM: 140 meq/L (ref 137–147)
Total Bilirubin: 0.6 mg/dL (ref 0.3–1.2)
Total Protein: 7.9 g/dL (ref 6.0–8.3)

## 2014-05-27 LAB — LIPASE, BLOOD: Lipase: 26 U/L (ref 11–59)

## 2014-05-27 LAB — RAPID URINE DRUG SCREEN, HOSP PERFORMED
Amphetamines: NOT DETECTED
BARBITURATES: NOT DETECTED
Benzodiazepines: NOT DETECTED
Cocaine: POSITIVE — AB
Opiates: NOT DETECTED
Tetrahydrocannabinol: POSITIVE — AB

## 2014-05-27 LAB — URINALYSIS, ROUTINE W REFLEX MICROSCOPIC
Glucose, UA: NEGATIVE mg/dL
KETONES UR: 40 mg/dL — AB
LEUKOCYTES UA: NEGATIVE
Nitrite: NEGATIVE
PH: 5.5 (ref 5.0–8.0)
Protein, ur: 100 mg/dL — AB
Specific Gravity, Urine: 1.031 — ABNORMAL HIGH (ref 1.005–1.030)
UROBILINOGEN UA: 0.2 mg/dL (ref 0.0–1.0)

## 2014-05-27 LAB — URINE MICROSCOPIC-ADD ON

## 2014-05-27 LAB — POC OCCULT BLOOD, ED: Fecal Occult Bld: NEGATIVE

## 2014-05-27 MED ORDER — LORAZEPAM 2 MG/ML IJ SOLN
1.0000 mg | Freq: Once | INTRAMUSCULAR | Status: AC
  Administered 2014-05-27: 1 mg via INTRAVENOUS
  Filled 2014-05-27: qty 1

## 2014-05-27 MED ORDER — HYDROMORPHONE HCL PF 1 MG/ML IJ SOLN
1.0000 mg | Freq: Once | INTRAMUSCULAR | Status: AC
  Administered 2014-05-27: 1 mg via INTRAVENOUS
  Filled 2014-05-27: qty 1

## 2014-05-27 MED ORDER — PROMETHAZINE HCL 25 MG/ML IJ SOLN
25.0000 mg | Freq: Once | INTRAMUSCULAR | Status: AC
  Administered 2014-05-27: 25 mg via INTRAVENOUS
  Filled 2014-05-27: qty 1

## 2014-05-27 MED ORDER — SODIUM CHLORIDE 0.9 % IV BOLUS (SEPSIS)
1000.0000 mL | Freq: Once | INTRAVENOUS | Status: AC
  Administered 2014-05-27: 1000 mL via INTRAVENOUS

## 2014-05-27 MED ORDER — ONDANSETRON HCL 4 MG/2ML IJ SOLN
4.0000 mg | Freq: Once | INTRAMUSCULAR | Status: AC
  Administered 2014-05-27: 4 mg via INTRAVENOUS
  Filled 2014-05-27: qty 2

## 2014-05-27 NOTE — ED Notes (Signed)
Patient transported by [facility] for complaint or right upper abdominal pain.  Patient states he was diagnosed with peptic ulcer 6 months ago and has had intermittent abdominal pain since.  States last night pain became much worse and more constant and is now having vomiting along with the pain.

## 2014-05-27 NOTE — ED Provider Notes (Signed)
CSN: [id number]     Arrival date & time 05/27/14  1754 History   First MD Initiated Contact with Patient 05/27/14 1808     Chief Complaint  Patient presents with  . Abdominal Pain     (Consider location/radiation/quality/duration/timing/severity/associated sxs/prior Treatment) Patient is a 28 y.o. male presenting with abdominal pain. The history is provided by the patient.  Abdominal Pain Pain location:  Generalized Pain quality: sharp   Pain radiates to:  RLQ Pain severity:  Severe Onset quality:  Gradual Duration:  2 days Timing:  Intermittent Progression:  Worsening Chronicity:  Recurrent Context: retching   Context: not alcohol use, not awakening from sleep, not diet changes, not eating, not laxative use, not medication withdrawal, not previous surgeries, not recent illness, not recent sexual activity, not recent travel, not sick contacts, not suspicious food intake and not trauma   Relieved by:  None tried Worsened by:  Nothing tried Ineffective treatments:  None tried Associated symptoms: diarrhea, dysuria, hematemesis, hematochezia, nausea and vomiting   Associated symptoms: no anorexia, no belching, no chest pain, no chills, no constipation, no cough, no fatigue, no fever, no flatus, no hematuria, no melena, no shortness of breath and no sore throat   Risk factors: no alcohol abuse, no aspirin use, has not had multiple surgeries, no NSAID use, not obese and no recent hospitalization    Patient endorses a history of multiple issues, including possible peptic ulcer disease, history of kidney stones, back pain, right lower cause her pain, recurrent nausea vomiting, rectal bleeding, and hematemesis.  Patient denies any recreational drug use. Patient states he has vomited 30 times today and has had variable amounts of hematemesis initially dark blood but then decreasing in intensity. Patient also endorses pain and mucoid stools.  Past Medical History  Diagnosis Date  . Narcotic  abuse   . Peptic ulcer    Past Surgical History  Procedure Laterality Date  . Hand surgery  2006   History reviewed. No pertinent family history. History  Substance Use Topics  . Smoking status: Current Every Day Smoker -- 0.50 packs/day    Types: Cigarettes  . Smokeless tobacco: Not on file  . Alcohol Use: 6.0 oz/week    10 Shots of liquor per week    Review of Systems  Constitutional: Negative.  Negative for fever, chills and fatigue.  HENT: Negative.  Negative for sore throat.   Eyes: Negative.   Respiratory: Negative.  Negative for cough and shortness of breath.   Cardiovascular: Negative.  Negative for chest pain.  Gastrointestinal: Positive for nausea, vomiting, abdominal pain, diarrhea, blood in stool, hematochezia and hematemesis. Negative for constipation, melena, anorexia and flatus.  Endocrine: Negative.   Genitourinary: Positive for dysuria. Negative for hematuria.  Musculoskeletal: Negative.   Skin: Negative.   Allergic/Immunologic: Negative.   Neurological: Negative.   Hematological: Negative.   Psychiatric/Behavioral: Negative.       Allergies  Review of patient's allergies indicates no known allergies.  Home Medications   Prior to Admission medications   Medication Sig Start Date End Date Taking? Authorizing Provider  bismuth subsalicylate (PEPTO BISMOL) 262 MG/15ML suspension Take 30 mLs by mouth once.   Yes Historical Provider, MD   BP 135/82  Pulse 54  Temp(Src) 98.9 F (37.2 C) (Oral)  Resp 16  Ht 5\' 7"  (1.702 m)  Wt 145 lb (65.772 kg)  BMI 22.71 kg/m2  SpO2 98% Physical Exam  Nursing note and vitals reviewed. Constitutional: He is oriented to person, place, and  time. He appears well-developed and well-nourished. No distress.  HENT:  Head: Normocephalic and atraumatic.  Right Ear: External ear normal.  Left Ear: External ear normal.  Nose: Nose normal.  Mouth/Throat: Oropharynx is clear and moist. No oropharyngeal exudate.  Eyes:  Conjunctivae and EOM are normal. Pupils are equal, round, and reactive to light. Right eye exhibits no discharge. Left eye exhibits no discharge. No scleral icterus.  Neck: Normal range of motion. Neck supple. No JVD present. No tracheal deviation present. No thyromegaly present.  Cardiovascular: Normal rate, regular rhythm, normal heart sounds and intact distal pulses.  Exam reveals no gallop and no friction rub.   No murmur heard. Pulmonary/Chest: Effort normal and breath sounds normal. No stridor. No respiratory distress. He has no wheezes. He has no rales. He exhibits no tenderness.  Abdominal: Soft. Bowel sounds are normal. He exhibits no distension. There is tenderness. There is rigidity, guarding, CVA tenderness and tenderness at McBurney's point. There is no rebound and negative Murphy's sign.  Patient keeps abdominal muscles contracted throughout the duration of abdominal exam.  Genitourinary: Guaiac negative stool.  Musculoskeletal: Normal range of motion. He exhibits no edema and no tenderness.  Lymphadenopathy:    He has no cervical adenopathy.  Neurological: He is alert and oriented to person, place, and time. No cranial nerve deficit.  Skin: Skin is warm and dry. No rash noted. He is not diaphoretic. No erythema. No pallor.  Psychiatric: He has a normal mood and affect. His behavior is normal. Judgment and thought content normal.    ED Course  Procedures (including critical care time) Labs Review Labs Reviewed  URINALYSIS, ROUTINE W REFLEX MICROSCOPIC - Abnormal; Notable for the following:    Color, Urine AMBER (*)    Specific Gravity, Urine 1.031 (*)    Hgb urine dipstick SMALL (*)    Bilirubin Urine SMALL (*)    Ketones, ur 40 (*)    Protein, ur 100 (*)    All other components within normal limits  URINE RAPID DRUG SCREEN (HOSP PERFORMED) - Abnormal; Notable for the following:    Cocaine POSITIVE (*)    Tetrahydrocannabinol POSITIVE (*)    All other components within  normal limits  COMPREHENSIVE METABOLIC PANEL - Abnormal; Notable for the following:    Chloride 95 (*)    Glucose, Bld 103 (*)    Creatinine, Ser 1.44 (*)    GFR calc non Af Amer 65 (*)    GFR calc Af Amer 75 (*)    Anion gap 16 (*)    All other components within normal limits  CBC WITH DIFFERENTIAL - Abnormal; Notable for the following:    WBC 11.0 (*)    Neutrophils Relative % 82 (*)    Neutro Abs 9.0 (*)    Lymphocytes Relative 11 (*)    All other components within normal limits  HEMOGLOBIN AND HEMATOCRIT, BLOOD - Abnormal; Notable for the following:    Hemoglobin 12.6 (*)    HCT 37.9 (*)    All other components within normal limits  URINE CULTURE  LIPASE, BLOOD  URINE MICROSCOPIC-ADD ON  HEMOGLOBIN AND HEMATOCRIT, BLOOD  BASIC METABOLIC PANEL  CBC  POC OCCULT BLOOD, ED    Imaging Review Ct Abdomen Pelvis Wo Contrast  05/27/2014   CLINICAL DATA:  Right upper quadrant abdominal pain, nausea and vomiting. History of nephrolithiasis, hematuria and ulcer.  EXAM: CT ABDOMEN AND PELVIS WITHOUT CONTRAST  TECHNIQUE: Multidetector CT imaging of the abdomen and pelvis was performed  following the standard protocol without IV contrast.  COMPARISON:  Radiographs obtained earlier today. Abdomen and pelvis CT from [facility] dated 06/03/2012.  FINDINGS: A somewhat small left kidney is again demonstrated with a normal appearing right kidney with the exception of 3 tiny calculi in the lower pole of the right kidney. The largest calculus measures 2 mm in maximum diameter. The smaller calculi the each measure. No bladder or ureteral calculi are seen. No hydronephrosis.  There is ingested high density material in the stomach and distal second portion of the duodenum. The third portion of the duodenum and distal second portion of the duodenum are dilated and filled with fluid. No other bowel dilatation is seen. No obstructing mass visualized. The examination of the bowel is limited by the lack of  intravenous and oral contrast. There is also some ingested high density material in the right colon.  Unremarkable non contrasted appearance of the liver, spleen, pancreas, gallbladder and adrenal glands. Normal-sized prostate gland. No evidence of appendicitis. No enlarged lymph nodes.  IMPRESSION: 1. Three tiny, nonobstructing lower pole right renal calculi. 2. No ureteral calculi or hydronephrosis. 3. Dilated, fluid-filled second and third portions of the duodenum. This could be due to ileus or partial obstruction. 4. Stable left renal atrophy.   Electronically Signed   By: Enrique Sack M.D.   On: 05/27/2014 21:59   Dg Abd Acute W/chest  05/27/2014   CLINICAL DATA:  Right lower quadrant pain  EXAM: ACUTE ABDOMEN SERIES (ABDOMEN 2 VIEW & CHEST 1 VIEW)  COMPARISON:  05/02/2014  FINDINGS: There is no evidence of dilated bowel loops or free intraperitoneal air. No radiopaque calculi or other significant radiographic abnormality is seen. Heart size and mediastinal contours are within normal limits. Both lungs are clear.  IMPRESSION: No acute abnormality noted.   Electronically Signed   By: Inez Catalina M.D.   On: 05/27/2014 19:37     EKG Interpretation None      MDM   Final diagnoses:  Ileus of unspecified type  Right upper quadrant pain  AKI (acute kidney injury)  Hematemesis with nausea  Intractable vomiting with nausea, vomiting of unspecified type  Polysubstance abuse   Patient symptoms fit multiple possible etiologies and on exam he endorses pain in numerous locations.  Presentation concerning for possible drug-seeking behavior; however, cannot exclude intra-abdominal pathology. Will obtain screening lab workup and CT study with need for contrast to be determined by urinalysis results. Patient will be provided with appropriate nausea IV fluids and pain control in the interim.    On reassessment patient endorses recurrent episodes of emesis while in the emergency department despite use of  multiple antiemetic agents.  However, patient is unable to produce any samples of emesis, he states that he threw away his emesis bag and the trash has since been collected. No further episodes of emesis observed by emergency department staff. Upon being awoken while sleeping, patient further endorses nausea and pain.  Workup significant for signs of dehydration and urine as well as scant blood. UDS significant for positive cocaine result. Patient previously denied recreational drug use but does admit to smoking crack and marijuana. Denies injection drug use.  CT abdomen and pelvis significant for signs of possible early obstruction. Patient will need admission for further management with IV fluids and monitoring to ensure that he does not develop worsening obstructive symptoms.  Patient has been discussed with hospitalist service and will be admitted for further management.  Patient care was discussed with my  attending, Dr. Wyvonnia Dusky.     Hoyle Sauer, MD 05/28/14 [phone]

## 2014-05-28 ENCOUNTER — Encounter (HOSPITAL_COMMUNITY): Payer: Self-pay | Admitting: *Deleted

## 2014-05-28 DIAGNOSIS — K209 Esophagitis, unspecified without bleeding: Secondary | ICD-10-CM | POA: Diagnosis present

## 2014-05-28 DIAGNOSIS — R1115 Cyclical vomiting syndrome unrelated to migraine: Secondary | ICD-10-CM

## 2014-05-28 DIAGNOSIS — F1910 Other psychoactive substance abuse, uncomplicated: Secondary | ICD-10-CM

## 2014-05-28 DIAGNOSIS — K567 Ileus, unspecified: Secondary | ICD-10-CM | POA: Diagnosis present

## 2014-05-28 DIAGNOSIS — N179 Acute kidney failure, unspecified: Secondary | ICD-10-CM | POA: Diagnosis present

## 2014-05-28 DIAGNOSIS — R110 Nausea: Secondary | ICD-10-CM

## 2014-05-28 DIAGNOSIS — K920 Hematemesis: Secondary | ICD-10-CM

## 2014-05-28 DIAGNOSIS — R109 Unspecified abdominal pain: Secondary | ICD-10-CM | POA: Diagnosis present

## 2014-05-28 DIAGNOSIS — K560 Paralytic ileus: Principal | ICD-10-CM

## 2014-05-28 DIAGNOSIS — R1011 Right upper quadrant pain: Secondary | ICD-10-CM

## 2014-05-28 LAB — CBC
HCT: 39.6 % (ref 39.0–52.0)
Hemoglobin: 13.1 g/dL (ref 13.0–17.0)
MCH: 31.3 pg (ref 26.0–34.0)
MCHC: 33.1 g/dL (ref 30.0–36.0)
MCV: 94.5 fL (ref 78.0–100.0)
Platelets: 202 10*3/uL (ref 150–400)
RBC: 4.19 MIL/uL — AB (ref 4.22–5.81)
RDW: 13.9 % (ref 11.5–15.5)
WBC: 7.4 10*3/uL (ref 4.0–10.5)

## 2014-05-28 LAB — BASIC METABOLIC PANEL
Anion gap: 7 (ref 5–15)
BUN: 17 mg/dL (ref 6–23)
CHLORIDE: 102 meq/L (ref 96–112)
CO2: 28 mEq/L (ref 19–32)
Calcium: 8.4 mg/dL (ref 8.4–10.5)
Creatinine, Ser: 1.44 mg/dL — ABNORMAL HIGH (ref 0.50–1.35)
GFR, EST AFRICAN AMERICAN: 75 mL/min — AB (ref >90)
GFR, EST NON AFRICAN AMERICAN: 65 mL/min — AB (ref >90)
GLUCOSE: 95 mg/dL (ref 70–99)
Potassium: 4.2 mEq/L (ref 3.7–5.3)
SODIUM: 137 meq/L (ref 137–147)

## 2014-05-28 LAB — HEMOGLOBIN AND HEMATOCRIT, BLOOD
HCT: 37.9 % — ABNORMAL LOW (ref 39.0–52.0)
HCT: 40.2 % (ref 39.0–52.0)
HEMOGLOBIN: 13.1 g/dL (ref 13.0–17.0)
Hemoglobin: 12.6 g/dL — ABNORMAL LOW (ref 13.0–17.0)

## 2014-05-28 MED ORDER — NICOTINE 7 MG/24HR TD PT24
7.0000 mg | MEDICATED_PATCH | Freq: Every day | TRANSDERMAL | Status: DC
  Administered 2014-05-29: 7 mg via TRANSDERMAL
  Filled 2014-05-28 (×2): qty 1

## 2014-05-28 MED ORDER — HYDROMORPHONE HCL PF 1 MG/ML IJ SOLN
0.5000 mg | INTRAMUSCULAR | Status: DC | PRN
  Administered 2014-05-28 – 2014-05-29 (×6): 1 mg via INTRAVENOUS
  Filled 2014-05-28 (×6): qty 1

## 2014-05-28 MED ORDER — PANTOPRAZOLE SODIUM 40 MG IV SOLR
40.0000 mg | Freq: Two times a day (BID) | INTRAVENOUS | Status: DC
  Administered 2014-05-28 – 2014-05-29 (×4): 40 mg via INTRAVENOUS
  Filled 2014-05-28 (×6): qty 40

## 2014-05-28 MED ORDER — ACETAMINOPHEN 650 MG RE SUPP: 650.0000 mg | Freq: Four times a day (QID) | RECTAL | Status: DC | PRN

## 2014-05-28 MED ORDER — PROMETHAZINE HCL 25 MG/ML IJ SOLN: 12.5000 mg | Freq: Four times a day (QID) | INTRAMUSCULAR | Status: DC | PRN

## 2014-05-28 MED ORDER — ONDANSETRON HCL 4 MG/2ML IJ SOLN
4.0000 mg | Freq: Four times a day (QID) | INTRAMUSCULAR | Status: DC
  Administered 2014-05-28 – 2014-05-29 (×4): 4 mg via INTRAVENOUS
  Filled 2014-05-28 (×4): qty 2

## 2014-05-28 MED ORDER — ONDANSETRON HCL 4 MG PO TABS
4.0000 mg | ORAL_TABLET | Freq: Four times a day (QID) | ORAL | Status: DC
  Administered 2014-05-29: 4 mg via ORAL
  Filled 2014-05-28 (×5): qty 1

## 2014-05-28 MED ORDER — SODIUM CHLORIDE 0.9 % IV SOLN
INTRAVENOUS | Status: DC
  Administered 2014-05-28 (×3): via INTRAVENOUS

## 2014-05-28 MED ORDER — HYDROMORPHONE HCL PF 1 MG/ML IJ SOLN
0.5000 mg | Freq: Once | INTRAMUSCULAR | Status: AC
  Administered 2014-05-28: 0.5 mg via INTRAVENOUS
  Filled 2014-05-28: qty 1

## 2014-05-28 MED ORDER — ONDANSETRON HCL 4 MG/2ML IJ SOLN
4.0000 mg | Freq: Four times a day (QID) | INTRAMUSCULAR | Status: DC | PRN
  Administered 2014-05-28: 4 mg via INTRAVENOUS
  Filled 2014-05-28: qty 2

## 2014-05-28 MED ORDER — ONDANSETRON HCL 4 MG PO TABS: 4.0000 mg | ORAL_TABLET | Freq: Four times a day (QID) | ORAL | Status: DC | PRN

## 2014-05-28 MED ORDER — OXYCODONE HCL 5 MG PO TABS
5.0000 mg | ORAL_TABLET | ORAL | Status: DC | PRN
  Administered 2014-05-28 – 2014-05-29 (×5): 5 mg via ORAL
  Filled 2014-05-28 (×5): qty 1

## 2014-05-28 MED ORDER — ACETAMINOPHEN 325 MG PO TABS
650.0000 mg | ORAL_TABLET | Freq: Four times a day (QID) | ORAL | Status: DC | PRN
  Administered 2014-05-29: 650 mg via ORAL
  Filled 2014-05-28: qty 2

## 2014-05-28 MED ORDER — KETOROLAC TROMETHAMINE 30 MG/ML IJ SOLN
30.0000 mg | Freq: Four times a day (QID) | INTRAMUSCULAR | Status: DC
Start: 2014-05-28 — End: 2014-05-29
  Administered 2014-05-28 – 2014-05-29 (×2): 30 mg via INTRAVENOUS
  Filled 2014-05-28 (×4): qty 1

## 2014-05-28 NOTE — Consult Note (Signed)
Consultation  Referring Provider:     Dr. Wynelle Cleveland - [facility] Primary Care Physician:  No PCP Per Patient Primary Gastroenterologist:        unassigned Reason for Consultation:     Abdominal pain, vomiting     Impression / Plan:   1) Hematemesis - scant 2) Chronic abdominal pain - in part muscular 3) dilated duodenum on CT of unclear significance 4) psychosocial stressors - break-up and custody battle 5) homeless - living with friend - lost house in custody battle 6) polysubstance abuse - + cocaine and marijuana (i smoke marijuana but I haven't done cocaine since I was 16 or 17) 7) history not supported by objective findings - rectal bleeding but heme negative   1) Toradol 2) EGD tomorrow (DrMarland Kitchen Collene Mares, or [facility]) - given overall sxs complex and duodenal changes think reaonable The risks and benefits as well as alternatives of endoscopic procedure(s) have been discussed and reviewed. All questions answered. The patient agrees to proceed. 3) consider psychological help, drug counseling - if amenable          HPI:   Brian [name] is a 28 y.o. male with 6 months of lower abdominal pain in RLQ - area. Also with frequent nausea and vomiting, spitting of blood. Occurring during custody battle and break-up with fiancee - he lost.  Worse in past few days - vomited small amounts of blood up. Claimed hematochezia but was heme negative.  Hx of PUD apparently but no prior endoscopy. No heartburn. Sonmewhat better since admit  Says has not used cocaine in years     Past Medical History  Diagnosis Date  . Narcotic abuse   . Peptic ulcer     Past Surgical History  Procedure Laterality Date  . Hand surgery  2006    Family History  Problem Relation Age of Onset  . Family history unknown: Yes    History  Substance Use Topics  . Smoking status: Current Every Day Smoker -- 0.50 packs/day    Types: Cigarettes  . Smokeless tobacco: Not on file  . Alcohol Use: 6.0  oz/week    10 Shots of liquor per week    Prior to Admission medications   Medication Sig Start Date End Date Taking? Authorizing Provider  bismuth subsalicylate (PEPTO BISMOL) 262 MG/15ML suspension Take 30 mLs by mouth once.   Yes Historical Provider, MD    Current Facility-Administered Medications  Medication Dose Route Frequency Provider Last Rate Last Dose  . 0.9 %  sodium chloride infusion   Intravenous Continuous Theressa Millard, MD 100 mL/hr at 05/28/14 0129    . acetaminophen (TYLENOL) tablet 650 mg  650 mg Oral Q6H PRN Theressa Millard, MD       Or  . acetaminophen (TYLENOL) suppository 650 mg  650 mg Rectal Q6H PRN Theressa Millard, MD      . HYDROmorphone (DILAUDID) injection 0.5-1 mg  0.5-1 mg Intravenous Q3H PRN Theressa Millard, MD   1 mg at 05/28/14 0806  . nicotine (NICODERM CQ - dosed in mg/24 hr) patch 7 mg  7 mg Transdermal Daily Saima Rizwan, MD      . ondansetron (ZOFRAN) injection 4 mg  4 mg Intravenous 4 times per day Debbe Odea, MD       Or  . ondansetron (ZOFRAN) tablet 4 mg  4 mg Oral 4 times per day Debbe Odea, MD      . oxyCODONE (Oxy IR/ROXICODONE) immediate release  tablet 5 mg  5 mg Oral Q4H PRN Theressa Millard, MD   5 mg at 05/28/14 1032  . pantoprazole (PROTONIX) injection 40 mg  40 mg Intravenous Q12H Theressa Millard, MD   40 mg at 05/28/14 1032    Allergies as of 05/27/2014  . (No Known Allergies)     Review of Systems:    This is positive for those things mentioned in the HPI. All other review of systems are negative.       Physical Exam:  Vital signs in last 24 hours: Temp:  [97.9 F (36.6 C)-98.9 F (37.2 C)] 98.6 F (37 C) (08/02 0939) Pulse Rate:  [45-63] 61 (08/02 0939) Resp:  [16-19] 19 (08/02 0939) BP: (103-135)/(61-82) 115/70 mmHg (08/02 0939) SpO2:  [97 %-100 %] 99 % (08/02 0939) Weight:  [145 lb (65.772 kg)] 145 lb (65.772 kg) (08/02 0116) Last BM Date: 05/27/14  General:  Well-developed, well-nourished  and in no acute distress Eyes:  anicteric. ENT:   Mouth and posterior pharynx free of lesions.  Neck:   supple w/o thyromegaly or mass.  Lungs: Clear to auscultation bilaterally. Heart:  S1S2, no rubs, murmurs, gallops. Abdomen:  thin and muscular, diffusely tender to light touch and worse with muscle tension, no hepatosplenomegaly, hernia, or mass and BS+.  Lymph:  no cervical or supraclavicular adenopathy. Extremities:   no edema Skin   no rash. Neuro:  A&O x 3.  Psych:  appropriate mood and  Affect.   Data Reviewed:   LAB RESULTS:  Recent Labs  05/27/14 1852 05/28/14 0155 05/28/14 0745  WBC 11.0*  --  7.4  HGB 14.9 12.6* 13.1  HCT 43.6 37.9* 39.6  PLT 267  --  202   BMET  Recent Labs  05/27/14 1852 05/28/14 0745  NA 140 137  K 3.7 4.2  CL 95* 102  CO2 29 28  GLUCOSE 103* 95  BUN 22 17  CREATININE 1.44* 1.44*  CALCIUM 9.5 8.4   LFT  Recent Labs  05/27/14 1852  PROT 7.9  ALBUMIN 4.4  AST 28  ALT 22  ALKPHOS 71  BILITOT 0.6         Ref Range 1d ago  33yr ago      Opiates NONE DETECTED  NONE DETECTED    NONE DETECTED        Cocaine NONE DETECTED  POSITIVE (A)    NONE DETECTED        Benzodiazepines NONE DETECTED  NONE DETECTED    NONE DETECTED        Amphetamines NONE DETECTED  NONE DETECTED    NONE DETECTED        Tetrahydrocannabinol NONE DETECTED  POSITIVE (A)    POSITIVE (A)        Barbiturates NONE DETECTED  NONE DETECTED    NONE DETECTED CM      Comments:        DRUG SCREEN FOR MEDICAL PURPOSES       STUDIES: Ct Abdomen Pelvis Wo Contrast  05/27/2014   CLINICAL DATA:  Right upper quadrant abdominal pain, nausea and vomiting. History of nephrolithiasis, hematuria and ulcer.  EXAM: CT ABDOMEN AND PELVIS WITHOUT CONTRAST  TECHNIQUE: Multidetector CT imaging of the abdomen and pelvis was performed following the standard protocol without IV contrast.  COMPARISON:  Radiographs obtained earlier today. Abdomen and pelvis CT from  [facility] dated 06/03/2012.  FINDINGS: A somewhat small left kidney is again demonstrated with a normal appearing right kidney with  the exception of 3 tiny calculi in the lower pole of the right kidney. The largest calculus measures 2 mm in maximum diameter. The smaller calculi the each measure. No bladder or ureteral calculi are seen. No hydronephrosis.  There is ingested high density material in the stomach and distal second portion of the duodenum. The third portion of the duodenum and distal second portion of the duodenum are dilated and filled with fluid. No other bowel dilatation is seen. No obstructing mass visualized. The examination of the bowel is limited by the lack of intravenous and oral contrast. There is also some ingested high density material in the right colon.  Unremarkable non contrasted appearance of the liver, spleen, pancreas, gallbladder and adrenal glands. Normal-sized prostate gland. No evidence of appendicitis. No enlarged lymph nodes.  IMPRESSION: 1. Three tiny, nonobstructing lower pole right renal calculi. 2. No ureteral calculi or hydronephrosis. 3. Dilated, fluid-filled second and third portions of the duodenum. This could be due to ileus or partial obstruction. 4. Stable left renal atrophy.   Electronically Signed   By: Enrique Sack M.D.   On: 05/27/2014 21:59   Dg Abd Acute W/chest  05/27/2014   CLINICAL DATA:  Right lower quadrant pain  EXAM: ACUTE ABDOMEN SERIES (ABDOMEN 2 VIEW & CHEST 1 VIEW)  COMPARISON:  05/02/2014  FINDINGS: There is no evidence of dilated bowel loops or free intraperitoneal air. No radiopaque calculi or other significant radiographic abnormality is seen. Heart size and mediastinal contours are within normal limits. Both lungs are clear.  IMPRESSION: No acute abnormality noted.   Electronically Signed   By: Inez Catalina M.D.   On: 05/27/2014 19:37     PREVIOUS ENDOSCOPIES:    NONE           Thanks   LOS: 1 day   @Shantavia Jha  Simonne Maffucci, MD, [facility] @   05/28/2014, 12:18 PM

## 2014-05-28 NOTE — Progress Notes (Addendum)
[facility] Progress Note   ARDARIUS HAASCH [id number] DOB: 1985/11/17 DOA: 05/27/2014 PCP: No PCP Per Patient  Brief narrative: Brian [name] is a 28 y.o. male  presents with hematemesis and abdominal pain for about 2 days. He has had hematemesis in the past - began about 6 mo ago and although he discussed it with a physician, no PPI was prescribed. He continued to drink alcohol and eat without discretion. He states he always has upper abdominal pain now but the pain on Friday night was severe. It was present in right side of his abdomen. He vomited bright red blood.    Subjective: Nauseated, trying to take in clears. Abdominal pain persists.   Assessment/Plan: Principal Problem:   Ileus of unspecified type - bowel sounds quite active today so I suspect this has resolved- cont diet per GI - to have EGD tomorrow  Active Problems:   Abdominal pain/  Mild Hematemesis/ nausea and vomiting with some weight loss - have asked for a GI eval- EGD tomorrow - gallbladder normal on CT abd - GI also suspects a muscular component to his abdominal pain and has started Toradol - advised to stop ETOH completely for now  Renal failure- left renal atrophy on CT  -follow- cont to hydrate  Nicotine abuse - nicotine patch placed  ETOH use daily - stopped drinking 2 days prior to admission- no signs of withdrawal yet- does not need CIWA scale at this point     Polysubstance abuse - uses marijuana but has not used cocaine in yrs.   Code Status: full code Family Communication: none Disposition Plan: home  Consultants: GI  Procedures: none  Antibiotics: Anti-infectives   None       DVT prophylaxis: SCDs  Objective: Filed Weights   05/27/14 1803 05/28/14 0116  Weight: 65.772 kg (145 lb) 65.772 kg (145 lb)    Intake/Output Summary (Last 24 hours) at 05/28/14 1321 Last data filed at 05/28/14 0944  Gross per 24 hour  Intake 551.67 ml  Output    335 ml  Net  216.67 ml     Vitals Filed Vitals:   05/28/14 0000 05/28/14 0116 05/28/14 0638 05/28/14 0939  BP: 125/81 127/72 103/61 115/70  Pulse: 45 52 51 61  Temp:  98.3 F (36.8 C) 97.9 F (36.6 C) 98.6 F (37 C)  TempSrc:  Oral  Oral  Resp:  18 17 19   Height:  5\' 7"  (1.702 m)    Weight:  65.772 kg (145 lb)    SpO2: 99% 100% 100% 99%    Exam: General: No acute respiratory distress  Lungs: Clear to auscultation bilaterally without wheezes or crackles Cardiovascular: Regular rate and rhythm without murmur gallop or rub normal S1 and S2 Abdomen: Nontender, nondistended, soft, bowel sounds positive, no rebound, no ascites, no appreciable mass Extremities: No significant cyanosis, clubbing, or edema bilateral lower extremities  Data Reviewed: Basic Metabolic Panel:  Recent Labs Lab 05/27/14 1852 05/28/14 0745  NA 140 137  K 3.7 4.2  CL 95* 102  CO2 29 28  GLUCOSE 103* 95  BUN 22 17  CREATININE 1.44* 1.44*  CALCIUM 9.5 8.4   Liver Function Tests:  Recent Labs Lab 05/27/14 1852  AST 28  ALT 22  ALKPHOS 71  BILITOT 0.6  PROT 7.9  ALBUMIN 4.4    Recent Labs Lab 05/27/14 1852  LIPASE 26   No results found for this basename: AMMONIA,  in the last 168 hours CBC:  Recent Labs  Lab 05/27/14 1852 05/28/14 0155 05/28/14 0745  WBC 11.0*  --  7.4  NEUTROABS 9.0*  --   --   HGB 14.9 12.6* 13.1  HCT 43.6 37.9* 39.6  MCV 91.4  --  94.5  PLT 267  --  202   Cardiac Enzymes: No results found for this basename: CKTOTAL, CKMB, CKMBINDEX, TROPONINI,  in the last 168 hours BNP (last 3 results) No results found for this basename: PROBNP,  in the last 8760 hours CBG: No results found for this basename: GLUCAP,  in the last 168 hours  No results found for this or any previous visit (from the past 240 hour(s)).   Studies:  Recent x-ray studies have been reviewed in detail by the Attending Physician  Scheduled Meds:  Scheduled Meds: . ketorolac  30 mg Intravenous 4  times per day  . nicotine  7 mg Transdermal Daily  . ondansetron (ZOFRAN) IV  4 mg Intravenous 4 times per day   Or  . ondansetron  4 mg Oral 4 times per day  . pantoprazole (PROTONIX) IV  40 mg Intravenous Q12H   Continuous Infusions: . sodium chloride 100 mL/hr at 05/28/14 1218    Time spent on care of this patient: 23min   Cynai Skeens, MD 05/28/2014, 1:21 PM  LOS: 1 day   [facility] Office  [phone] Pager - Text Page per [email or web address]  If 7PM-7AM, please contact night-coverage [email or web address]

## 2014-05-28 NOTE — H&P (Signed)
[facility] Admission History and Physical       Brian [name] [id number] DOB: April 30, 1986 DOA: 05/27/2014  Referring physician: EDP PCP: No PCP Per Patient  Specialists:   Chief Complaint:  ABD Pain and Nausea and Vomiting  HPI: Brian [name] is a 28 y.o. male with a history of Polysubstance Abuse who presents to the ED with complaints of severe intractable N+V today and RUQ ABD pain.  He reports having hematemesis and havingn BRBPR today as well.  He reports that he has had RUQ ABD Pain x 6 months and it has become worse over the past 3 months.  He also reports having intermittent hematemesis for months.    A Ct scan of the ABD revealed and Ileus and possible early Obstruction, and initial labs reveal a hemoglobin of 14.9, and FOBT was Heme Negative.      Review of Systems:  Constitutional: No Weight Loss, No Weight Gain, Night Sweats, Fevers, Chills, Dizziness, Fatigue, or Generalized Weakness HEENT: No Headaches, Difficulty Swallowing,Tooth/Dental Problems,Sore Throat,  No Sneezing, Rhinitis, Ear Ache, Nasal Congestion, or Post Nasal Drip,  Cardio-vascular:  No Chest pain, Orthopnea, PND, Edema in Lower Extremities, Anasarca, Dizziness, Palpitations  Resp: No Dyspnea, No DOE, No Cough, No Hemoptysis, No Wheezing.    GI: No Heartburn, Indigestion, +Abdominal Pain, +Nausea, +Vomiting, Diarrhea, Hematemesis, Hematochezia, Melena, Change in Bowel Habits,  Loss of Appetite  GU: No Dysuria, Change in Color of Urine, No Urgency or Frequency, No Flank pain.  Musculoskeletal: No Joint Pain or Swelling, No Decreased Range of Motion, No Back Pain.  Neurologic: No Syncope, No Seizures, Muscle Weakness, Paresthesia, Vision Disturbance or Loss, No Diplopia, No Vertigo, No Difficulty Walking,  Skin: No Rash or Lesions. Psych: No Change in Mood or Affect, No Depression or Anxiety, No Memory loss, No Confusion, or Hallucinations   Past Medical History  Diagnosis Date  .  Narcotic abuse   . Peptic ulcer      Past Surgical History  Procedure Laterality Date  . Hand surgery  2006      Prior to Admission medications   Medication Sig Start Date End Date Taking? Authorizing Provider  bismuth subsalicylate (PEPTO BISMOL) 262 MG/15ML suspension Take 30 mLs by mouth once.   Yes Historical Provider, MD     No Known Allergies   Social History:  reports that he has been smoking Cigarettes.  He has been smoking about 0.50 packs per day. He does not have any smokeless tobacco history on file. He reports that he drinks about 6 ounces of alcohol per week. He reports that he does not use illicit drugs.     History reviewed. No pertinent family history.     Physical Exam:  GEN:  Thin Well Nourished and Well Developed 28 y.o. male examined and in no acute distress; cooperative with exam Filed Vitals:   05/27/14 1803 05/27/14 1815 05/27/14 2000 05/27/14 2100  BP: 135/82 132/74 125/65 114/74  Pulse: 54 61 63 60  Temp: 98.9 F (37.2 C)     TempSrc: Oral     Resp: 16     Height: 5\' 7"  (1.702 m)     Weight: 65.772 kg (145 lb)     SpO2: 98% 99% 97% 98%   Blood pressure 114/74, pulse 60, temperature 98.9 F (37.2 C), temperature source Oral, resp. rate 16, height 5\' 7"  (1.702 m), weight 65.772 kg (145 lb), SpO2 98.00%. PSYCH: He is alert and oriented x4; does not appear anxious does not  appear depressed; affect is normal HEENT: Normocephalic and Atraumatic, Mucous membranes pink; PERRLA; EOM intact; Fundi:  Benign;  No scleral icterus, Nares: Patent, Oropharynx: Clear, Fair Dentition,    Neck:  FROM, No Cervical Lymphadenopathy nor Thyromegaly or Carotid Bruit; No JVD; Breasts:: Not examined CHEST WALL: No tenderness CHEST: Normal respiration, clear to auscultation bilaterally HEART: Regular rate and rhythm; no murmurs rubs or gallops BACK: No kyphosis or scoliosis; No CVA tenderness ABDOMEN: Positive Bowel Sounds, Scaphoid, Soft Non-Tender; No Masses, No  Organomegaly. Rectal Exam: Not done EXTREMITIES: No  Cyanosis, Clubbing, or Edema; No Ulcerations. Genitalia: not examined PULSES: 2+ and symmetric SKIN: Normal hydration no rash or ulceration CNS: Alert x Oriented x 4, No Focal Deficits.    Vascular: pulses palpable throughout    Labs on Admission:  Basic Metabolic Panel:  Recent Labs Lab 05/27/14 1852  NA 140  K 3.7  CL 95*  CO2 29  GLUCOSE 103*  BUN 22  CREATININE 1.44*  CALCIUM 9.5   Liver Function Tests:  Recent Labs Lab 05/27/14 1852  AST 28  ALT 22  ALKPHOS 71  BILITOT 0.6  PROT 7.9  ALBUMIN 4.4    Recent Labs Lab 05/27/14 1852  LIPASE 26   No results found for this basename: AMMONIA,  in the last 168 hours CBC:  Recent Labs Lab 05/27/14 1852  WBC 11.0*  NEUTROABS 9.0*  HGB 14.9  HCT 43.6  MCV 91.4  PLT 267   Cardiac Enzymes: No results found for this basename: CKTOTAL, CKMB, CKMBINDEX, TROPONINI,  in the last 168 hours  BNP (last 3 results) No results found for this basename: PROBNP,  in the last 8760 hours CBG: No results found for this basename: GLUCAP,  in the last 168 hours  Radiological Exams on Admission: Ct Abdomen Pelvis Wo Contrast  05/27/2014   CLINICAL DATA:  Right upper quadrant abdominal pain, nausea and vomiting. History of nephrolithiasis, hematuria and ulcer.  EXAM: CT ABDOMEN AND PELVIS WITHOUT CONTRAST  TECHNIQUE: Multidetector CT imaging of the abdomen and pelvis was performed following the standard protocol without IV contrast.  COMPARISON:  Radiographs obtained earlier today. Abdomen and pelvis CT from [facility] dated 06/03/2012.  FINDINGS: A somewhat small left kidney is again demonstrated with a normal appearing right kidney with the exception of 3 tiny calculi in the lower pole of the right kidney. The largest calculus measures 2 mm in maximum diameter. The smaller calculi the each measure. No bladder or ureteral calculi are seen. No hydronephrosis.  There is ingested  high density material in the stomach and distal second portion of the duodenum. The third portion of the duodenum and distal second portion of the duodenum are dilated and filled with fluid. No other bowel dilatation is seen. No obstructing mass visualized. The examination of the bowel is limited by the lack of intravenous and oral contrast. There is also some ingested high density material in the right colon.  Unremarkable non contrasted appearance of the liver, spleen, pancreas, gallbladder and adrenal glands. Normal-sized prostate gland. No evidence of appendicitis. No enlarged lymph nodes.  IMPRESSION: 1. Three tiny, nonobstructing lower pole right renal calculi. 2. No ureteral calculi or hydronephrosis. 3. Dilated, fluid-filled second and third portions of the duodenum. This could be due to ileus or partial obstruction. 4. Stable left renal atrophy.   Electronically Signed   By: Enrique Sack M.D.   On: 05/27/2014 21:59   Dg Abd Acute W/chest  05/27/2014   CLINICAL DATA:  Right lower quadrant pain  EXAM: ACUTE ABDOMEN SERIES (ABDOMEN 2 VIEW & CHEST 1 VIEW)  COMPARISON:  05/02/2014  FINDINGS: There is no evidence of dilated bowel loops or free intraperitoneal air. No radiopaque calculi or other significant radiographic abnormality is seen. Heart size and mediastinal contours are within normal limits. Both lungs are clear.  IMPRESSION: No acute abnormality noted.   Electronically Signed   By: Inez Catalina M.D.   On: 05/27/2014 19:37      Assessment/Plan:   28 y.o. male with  Principal Problem:  1.   Ileus of unspecified type   Anti-emetics PRN,  IVFs,     Active Problems:  2.   Abdominal pain   Paibn control PRN    3.   AKI (acute kidney injury)   IVFs,  Monitor BUN/Cr.       4.   Intractable nausea and vomiting   Anti-Emetics PRN    5.   Polysubstance abuse   UDS +Cocaine, and THC.       6.   Hematemesis-  Self Reported. Also taking Pepto-Bismol   Monitor H/H, and IV Protonix q 12,      My need GI consult in AM.        7.   DVT Prophylaxis   SCDs.       Code Status: FULL CODE   Family Communication:   No Family Present   Disposition Plan:   Inpatient      Time spent:  63 minutes  Theressa Millard [facility] Pager [phone]   If [place] Please Contact the Day Rounding Team MD for [facility]  If 7PM-7AM, Please Contact Night-Floor Coverage  [email or web address] Password [facility] 05/28/2014, 12:36 AM

## 2014-05-28 NOTE — ED Provider Notes (Signed)
I saw and evaluated the patient, reviewed the resident's note and I agree with the findings and plan. If applicable, I agree with the resident's interpretation of the EKG.  If applicable, I was present for critical portions of any procedures performed.  6 month history of R sided abdominal pain with nausea and vomiting.  States vomited >30 times today, some blood streaks and clots.  States "pink" stool."  On exam, comfortable, VSS.  Difficult abdominal exam due to voluntary guarding.  Diffusely tender, worse on R side. Scant blood in UA, cocaine positive. AAS negative, but possible early obstruction on CT.  Admit for symptom control and hydration given ongoing vomiting in the ED.  Ezequiel Essex, MD 05/28/14 [phone]

## 2014-05-29 ENCOUNTER — Encounter (HOSPITAL_COMMUNITY): Payer: Self-pay

## 2014-05-29 ENCOUNTER — Encounter (HOSPITAL_COMMUNITY)
Admission: EM | Disposition: A | Discharge status: Home / Self Care | Payer: Self-pay | Source: Home / Self Care | Attending: Internal Medicine

## 2014-05-29 DIAGNOSIS — K209 Esophagitis, unspecified without bleeding: Secondary | ICD-10-CM

## 2014-05-29 HISTORY — PX: ESOPHAGOGASTRODUODENOSCOPY: SHX5428

## 2014-05-29 LAB — URINE CULTURE: Colony Count: 2000

## 2014-05-29 LAB — HEMOGLOBIN AND HEMATOCRIT, BLOOD
HCT: 42.3 % (ref 39.0–52.0)
HEMOGLOBIN: 13.8 g/dL (ref 13.0–17.0)

## 2014-05-29 SURGERY — EGD (ESOPHAGOGASTRODUODENOSCOPY)
Anesthesia: Moderate Sedation

## 2014-05-29 MED ORDER — PANTOPRAZOLE SODIUM 40 MG PO TBEC: 40.0000 mg | DELAYED_RELEASE_TABLET | Freq: Every day | ORAL | Status: DC

## 2014-05-29 MED ORDER — DIPHENHYDRAMINE HCL 50 MG/ML IJ SOLN
INTRAMUSCULAR | Status: AC
  Filled 2014-05-29: qty 1

## 2014-05-29 MED ORDER — DICLOFENAC SODIUM 1 % TD GEL
4.0000 g | Freq: Four times a day (QID) | TRANSDERMAL | Status: DC
  Administered 2014-05-29 (×2): 4 g via TOPICAL
  Filled 2014-05-29: qty 100

## 2014-05-29 MED ORDER — OXYCODONE HCL 5 MG PO TABS
5.0000 mg | ORAL_TABLET | ORAL | Status: DC | PRN
Start: 2014-05-29 — End: 2015-05-20

## 2014-05-29 MED ORDER — DICLOFENAC SODIUM 1 % TD GEL: 4.0000 g | Freq: Four times a day (QID) | TRANSDERMAL | Status: DC

## 2014-05-29 MED ORDER — BUTAMBEN-TETRACAINE-BENZOCAINE 2-2-14 % EX AERO
INHALATION_SPRAY | CUTANEOUS | Status: DC | PRN
  Administered 2014-05-29: 2 via TOPICAL

## 2014-05-29 MED ORDER — FENTANYL CITRATE 0.05 MG/ML IJ SOLN
INTRAMUSCULAR | Status: AC
  Filled 2014-05-29: qty 2

## 2014-05-29 MED ORDER — FENTANYL CITRATE 0.05 MG/ML IJ SOLN
INTRAMUSCULAR | Status: DC | PRN
  Administered 2014-05-29 (×2): 25 ug via INTRAVENOUS

## 2014-05-29 MED ORDER — MIDAZOLAM HCL 5 MG/ML IJ SOLN
INTRAMUSCULAR | Status: AC
  Filled 2014-05-29: qty 3

## 2014-05-29 MED ORDER — MIDAZOLAM HCL 10 MG/2ML IJ SOLN
INTRAMUSCULAR | Status: DC | PRN
  Administered 2014-05-29: 2 mg via INTRAVENOUS
  Administered 2014-05-29: 1 mg via INTRAVENOUS
  Administered 2014-05-29: 2 mg via INTRAVENOUS

## 2014-05-29 MED ORDER — BISMUTH SUBSALICYLATE 262 MG/15ML PO SUSP: 30.0000 mL | ORAL | Status: DC | PRN

## 2014-05-29 MED ORDER — DIPHENHYDRAMINE HCL 50 MG/ML IJ SOLN
12.5000 mg | Freq: Four times a day (QID) | INTRAMUSCULAR | Status: DC | PRN
  Administered 2014-05-29: 12.5 mg via INTRAVENOUS
  Filled 2014-05-29: qty 1

## 2014-05-29 NOTE — Op Note (Signed)
[facility] [place], [id number]   OPERATIVE PROCEDURE REPORT  PATIENT: Brian [name], Brian [name]  MR#: [id number] BIRTHDATE: 22-Dec-1985  GENDER: Male ENDOSCOPIST: Carol Ada, MD ASSISTANT:   Sharon Mt, Endo Technician and Wray Kearns, RN PROCEDURE DATE: 05/29/2014 PROCEDURE:   EGD, diagnostic ASA CLASS:   Class II INDICATIONS:Abnormal CT scan. MEDICATIONS: Versed 5 mg IV and Fentanyl 50 mcg IV TOPICAL ANESTHETIC:   Cetacaine Spray  DESCRIPTION OF PROCEDURE:   After the risks benefits and alternatives of the procedure were thoroughly explained, informed consent was obtained.  The Pentax Gastroscope [id number]  endoscope was introduced through the mouth  and advanced to the third portion of the duodenum Without limitations.      The instrument was slowly withdrawn as the mucosa was fully examined.      FINDINGS: In the distal esophagus there was evidence of a mild esophagitis.  This could be secondary to his vomiting.  No gastric abrnormalities.  In the second portion of the duodenum, it was noted to be dilated and there was a sharp transition to the third portion of the duodnum.  No evidence of any obstruction, inflammation, or masses.  The endoscope was easily maneuvered between the second and third portions of the duodenum. Retroflexed views revealed no abnormalities.     The scope was then withdrawn from the patient and the procedure terminated.  COMPLICATIONS: There were no complications.  IMPRESSION: 1) Mild esohpagitis. 2) Dilated second portion of the duodenum of unknown significance.  RECOMMENDATIONS: 1) PPI. 2) Advance diet as tolerated. 3) Follow clinically.  _______________________________ eSignedCarol Ada, MD 05/29/2014 8:41 AM    PATIENT NAME:  Brian [name], Brian [name] MR#: [id number]

## 2014-05-29 NOTE — Progress Notes (Signed)
Patient left floor this morning to go outside and smoke.  He was instructed NOT to leave the floor for his own safety.  He was found outside again a couple hours later and was again reminded that he should not leave the floor for any reason.  Patient verbalizes understanding.

## 2014-05-29 NOTE — Discharge Summary (Signed)
Physician Discharge Summary  Brian [name] [id number] DOB: 20-Oct-1986 DOA: 05/27/2014  PCP: No PCP Per Patient  Admit date: 05/27/2014 Discharge date: 05/29/2014  Time spent: >45 minutes  Recommendations for Outpatient Follow-up:  1. Need [facility] in 1-2 wks.   Discharge Diagnoses:  Principal Problem:   Abdominal pain Active Problems:   Ileus of unspecified type   AKI (acute kidney injury)   Esophagitis, acute   Polysubstance abuse   Hematemesis   Discharge Condition: stable  Diet recommendation: heart healthy, no caffeine or ETOH   Filed Weights   05/27/14 1803 05/28/14 0116  Weight: 65.772 kg (145 lb) 65.772 kg (145 lb)    History of present illness:  Brian [name] is a 27 y.o. male presents with hematemesis and abdominal pain for about 2 days. He has had hematemesis in the past - began about 6 mo ago and although he discussed it with a physician, no PPI was prescribed. He continued to drink alcohol and eat without discretion. He states he always has upper abdominal pain now but the pain on Friday night was severe. It was present in right side of his abdomen. He vomited bright red blood.   Hospital Course:  Principal Problem:   Abdominal pain/ Mild Hematemesis/ nausea and vomiting with some weight loss  - CT abd reveals fluid filled dilated duodenum- see below - have asked for a GI eval- EGD reveals only mild esophagitis and duodenal dilatation with a mentioned in GI note is of unspecified significance -  his abdominal pain is actually in the right abdomen (present for months) and much worse when raising his legs- suspected by GI and myself to be muscular in nature - I have prescribed Voltaren gel, ice and avoidance of activities that exacerbate the pain.  - gallbladder normal on CT abd  - advised to stop ETOH completely for now - he has tolerated solid food without difficulty and will be discharged home.    Ileus? - bowel sounds quite active on 8/2 therefore, if  an ileus was present, it has resolved- cont diet per GI - to have EGD tomorrow   Renal failure-- acute - was Cr was normal on 7/7 - left renal atrophy on CT- h/o nephrolithiasis which may have contributed to this   Nicotine abuse  - nicotine patch placed   ETOH use daily  - stopped drinking 2 days prior to admission- no signs of withdrawal yet- does not need CIWA scale at this point   Polysubstance abuse  - h/o narcotic abuse and cocaine abuse - uses marijuana but states he has not used cocaine in yrs. and narcotics in months   Procedures: EGD 8/30 1) Mild esohpagitis.  2) Dilated second portion of the duodenum of unknown significance.  Consultations:  GI  Discharge Exam: Filed Vitals:   05/29/14 0940  BP: 121/75  Pulse: 46  Temp: 97.7 F (36.5 C)  Resp: 18   General: No acute respiratory distress  Lungs: Clear to auscultation bilaterally without wheezes or crackles  Cardiovascular: Regular rate and rhythm without murmur gallop or rub normal S1 and S2  Abdomen: tender in right abdomen, nondistended, soft, bowel sounds positive, no rebound, no ascites, no appreciable mass  Extremities: No significant cyanosis, clubbing, or edema bilateral lower extremities   Discharge Instructions You were cared for by a hospitalist during your hospital stay. If you have any questions about your discharge medications or the care you received while you were in the hospital after you are  discharged, you can call the unit and asked to speak with the hospitalist on call if the hospitalist that took care of you is not available. Once you are discharged, your primary care physician will handle any further medical issues. Please note that NO REFILLS for any discharge medications will be authorized once you are discharged, as it is imperative that you return to your primary care physician (or establish a relationship with a primary care physician if you do not have one) for your aftercare needs so  that they can reassess your need for medications and monitor your lab values.     Medication List         bismuth subsalicylate [id number] suspension  Commonly known as:  PEPTO BISMOL  Take 30 mLs by mouth every 4 (four) hours as needed.     diclofenac sodium 1 % Gel  Commonly known as:  VOLTAREN  Apply 4 g topically 4 (four) times daily.     oxyCODONE 5 MG immediate release tablet  Commonly known as:  Oxy IR/ROXICODONE  Take 1 tablet (5 mg total) by mouth every 4 (four) hours as needed for moderate pain.     pantoprazole 40 MG tablet  Commonly known as:  PROTONIX  Take 1 tablet (40 mg total) by mouth daily.       No Known Allergies    The results of significant diagnostics from this hospitalization (including imaging, microbiology, ancillary and laboratory) are listed below for reference.    Significant Diagnostic Studies: Ct Abdomen Pelvis Wo Contrast  05/27/2014   CLINICAL DATA:  Right upper quadrant abdominal pain, nausea and vomiting. History of nephrolithiasis, hematuria and ulcer.  EXAM: CT ABDOMEN AND PELVIS WITHOUT CONTRAST  TECHNIQUE: Multidetector CT imaging of the abdomen and pelvis was performed following the standard protocol without IV contrast.  COMPARISON:  Radiographs obtained earlier today. Abdomen and pelvis CT from [facility] dated 06/03/2012.  FINDINGS: A somewhat small left kidney is again demonstrated with a normal appearing right kidney with the exception of 3 tiny calculi in the lower pole of the right kidney. The largest calculus measures 2 mm in maximum diameter. The smaller calculi the each measure. No bladder or ureteral calculi are seen. No hydronephrosis.  There is ingested high density material in the stomach and distal second portion of the duodenum. The third portion of the duodenum and distal second portion of the duodenum are dilated and filled with fluid. No other bowel dilatation is seen. No obstructing mass visualized. The examination of the  bowel is limited by the lack of intravenous and oral contrast. There is also some ingested high density material in the right colon.  Unremarkable non contrasted appearance of the liver, spleen, pancreas, gallbladder and adrenal glands. Normal-sized prostate gland. No evidence of appendicitis. No enlarged lymph nodes.  IMPRESSION: 1. Three tiny, nonobstructing lower pole right renal calculi. 2. No ureteral calculi or hydronephrosis. 3. Dilated, fluid-filled second and third portions of the duodenum. This could be due to ileus or partial obstruction. 4. Stable left renal atrophy.   Electronically Signed   By: Enrique Sack M.D.   On: 05/27/2014 21:59   Dg Chest 1 View  05/02/2014   CLINICAL DATA:  Patient fell off skateboard  EXAM: CHEST - 1 VIEW  COMPARISON:  None.  FINDINGS: The heart size and mediastinal contours are within normal limits. Both lungs are clear. The visualized skeletal structures are unremarkable.  IMPRESSION: No active disease.   Electronically Signed   By:  Kathreen Devoid   On: 05/02/2014 16:53   Dg Pelvis 1-2 Views  05/02/2014   CLINICAL DATA:  Status post fall.  Pain.  EXAM: PELVIS - 1-2 VIEW  COMPARISON:  None.  FINDINGS: Imaged bones, joints and soft tissues appear normal.  IMPRESSION: Negative exam.   Electronically Signed   By: Inge Rise M.D.   On: 05/02/2014 16:54   Ct Head Wo Contrast  05/02/2014   CLINICAL DATA:  Fall  EXAM: CT HEAD WITHOUT CONTRAST  CT MAXILLOFACIAL WITHOUT CONTRAST  CT CERVICAL SPINE WITHOUT CONTRAST  TECHNIQUE: Multidetector CT imaging of the head, cervical spine, and maxillofacial structures were performed using the standard protocol without intravenous contrast. Multiplanar CT image reconstructions of the cervical spine and maxillofacial structures were also generated.  COMPARISON:  None.  FINDINGS: CT HEAD FINDINGS  There is no evidence of mass effect, midline shift or extra-axial fluid collections. There is no evidence of a space-occupying lesion or  intracranial hemorrhage. There is no evidence of a cortical-based area of acute infarction.  The ventricles and sulci are appropriate for the patient's age. The basal cisterns are patent.  Visualized portions of the orbits are unremarkable. The visualized portions of the paranasal sinuses and mastoid air cells are unremarkable.  The osseous structures are unremarkable. Right frontal scalp/supraorbital hematoma.  CT MAXILLOFACIAL FINDINGS  Right frontal scalp/supraorbital hematoma. The globes are intact. The orbital walls are intact. The orbital floors are intact. The maxilla is intact. The mandible is intact. The zygomatic arches are intact. The nasal septum is midline. There is no nasal bone fracture. The temporomandibular joints are normal.  There is a dental caries involving the left lower first molar.  The paranasal sinuses are clear. The visualized portions of the mastoid sinuses are well aerated.  CT CERVICAL SPINE FINDINGS  The alignment is anatomic. The vertebral body heights are maintained. Incidental note is made of incomplete fusion of the posterior arch of C1. There is no acute fracture. There is no static listhesis. The prevertebral soft tissues are normal. The intraspinal soft tissues are not fully imaged on this examination due to poor soft tissue contrast, but there is no gross soft tissue abnormality.  The disc spaces are maintained.  The visualized portions of the lung apices demonstrate no focal abnormality.  IMPRESSION: 1. No acute intracranial pathology. 2. No acute osseous injury of the maxillofacial bones. 3. No acute osseous injury of the cervical spine. 4. Dental caries involving the left lower first molar.   Electronically Signed   By: Kathreen Devoid   On: 05/02/2014 16:28   Ct Cervical Spine Wo Contrast  05/02/2014   CLINICAL DATA:  Fall  EXAM: CT HEAD WITHOUT CONTRAST  CT MAXILLOFACIAL WITHOUT CONTRAST  CT CERVICAL SPINE WITHOUT CONTRAST  TECHNIQUE: Multidetector CT imaging of the head,  cervical spine, and maxillofacial structures were performed using the standard protocol without intravenous contrast. Multiplanar CT image reconstructions of the cervical spine and maxillofacial structures were also generated.  COMPARISON:  None.  FINDINGS: CT HEAD FINDINGS  There is no evidence of mass effect, midline shift or extra-axial fluid collections. There is no evidence of a space-occupying lesion or intracranial hemorrhage. There is no evidence of a cortical-based area of acute infarction.  The ventricles and sulci are appropriate for the patient's age. The basal cisterns are patent.  Visualized portions of the orbits are unremarkable. The visualized portions of the paranasal sinuses and mastoid air cells are unremarkable.  The osseous structures are unremarkable.  Right frontal scalp/supraorbital hematoma.  CT MAXILLOFACIAL FINDINGS  Right frontal scalp/supraorbital hematoma. The globes are intact. The orbital walls are intact. The orbital floors are intact. The maxilla is intact. The mandible is intact. The zygomatic arches are intact. The nasal septum is midline. There is no nasal bone fracture. The temporomandibular joints are normal.  There is a dental caries involving the left lower first molar.  The paranasal sinuses are clear. The visualized portions of the mastoid sinuses are well aerated.  CT CERVICAL SPINE FINDINGS  The alignment is anatomic. The vertebral body heights are maintained. Incidental note is made of incomplete fusion of the posterior arch of C1. There is no acute fracture. There is no static listhesis. The prevertebral soft tissues are normal. The intraspinal soft tissues are not fully imaged on this examination due to poor soft tissue contrast, but there is no gross soft tissue abnormality.  The disc spaces are maintained.  The visualized portions of the lung apices demonstrate no focal abnormality.  IMPRESSION: 1. No acute intracranial pathology. 2. No acute osseous injury of the  maxillofacial bones. 3. No acute osseous injury of the cervical spine. 4. Dental caries involving the left lower first molar.   Electronically Signed   By: Kathreen Devoid   On: 05/02/2014 16:28   Dg Shoulder Left  05/02/2014   CLINICAL DATA:  Patient fell off a skateboard, left shoulder pain  EXAM: LEFT SHOULDER - 2+ VIEW  COMPARISON:  None.  FINDINGS: There is no evidence of fracture or dislocation. There is no evidence of arthropathy or other focal bone abnormality. Soft tissues are unremarkable.  IMPRESSION: Negative.   Electronically Signed   By: Kathreen Devoid   On: 05/02/2014 16:54   Dg Abd Acute W/chest  05/27/2014   CLINICAL DATA:  Right lower quadrant pain  EXAM: ACUTE ABDOMEN SERIES (ABDOMEN 2 VIEW & CHEST 1 VIEW)  COMPARISON:  05/02/2014  FINDINGS: There is no evidence of dilated bowel loops or free intraperitoneal air. No radiopaque calculi or other significant radiographic abnormality is seen. Heart size and mediastinal contours are within normal limits. Both lungs are clear.  IMPRESSION: No acute abnormality noted.   Electronically Signed   By: Inez Catalina M.D.   On: 05/27/2014 19:37   Ct Maxillofacial Wo Cm  05/02/2014   CLINICAL DATA:  Fall  EXAM: CT HEAD WITHOUT CONTRAST  CT MAXILLOFACIAL WITHOUT CONTRAST  CT CERVICAL SPINE WITHOUT CONTRAST  TECHNIQUE: Multidetector CT imaging of the head, cervical spine, and maxillofacial structures were performed using the standard protocol without intravenous contrast. Multiplanar CT image reconstructions of the cervical spine and maxillofacial structures were also generated.  COMPARISON:  None.  FINDINGS: CT HEAD FINDINGS  There is no evidence of mass effect, midline shift or extra-axial fluid collections. There is no evidence of a space-occupying lesion or intracranial hemorrhage. There is no evidence of a cortical-based area of acute infarction.  The ventricles and sulci are appropriate for the patient's age. The basal cisterns are patent.  Visualized  portions of the orbits are unremarkable. The visualized portions of the paranasal sinuses and mastoid air cells are unremarkable.  The osseous structures are unremarkable. Right frontal scalp/supraorbital hematoma.  CT MAXILLOFACIAL FINDINGS  Right frontal scalp/supraorbital hematoma. The globes are intact. The orbital walls are intact. The orbital floors are intact. The maxilla is intact. The mandible is intact. The zygomatic arches are intact. The nasal septum is midline. There is no nasal bone fracture. The temporomandibular joints are normal.  There is a dental caries involving the left lower first molar.  The paranasal sinuses are clear. The visualized portions of the mastoid sinuses are well aerated.  CT CERVICAL SPINE FINDINGS  The alignment is anatomic. The vertebral body heights are maintained. Incidental note is made of incomplete fusion of the posterior arch of C1. There is no acute fracture. There is no static listhesis. The prevertebral soft tissues are normal. The intraspinal soft tissues are not fully imaged on this examination due to poor soft tissue contrast, but there is no gross soft tissue abnormality.  The disc spaces are maintained.  The visualized portions of the lung apices demonstrate no focal abnormality.  IMPRESSION: 1. No acute intracranial pathology. 2. No acute osseous injury of the maxillofacial bones. 3. No acute osseous injury of the cervical spine. 4. Dental caries involving the left lower first molar.   Electronically Signed   By: Kathreen Devoid   On: 05/02/2014 16:28    Microbiology: Recent Results (from the past 240 hour(s))  URINE CULTURE     Status: None   Collection Time    05/27/14  7:22 PM      Result Value Ref Range Status   Specimen Description URINE, CLEAN CATCH   Final   Special Requests NONE   Final   Culture  Setup Time     Final   Value: 05/28/2014 01:31     Performed at [place]     Final   Value: 2,000 COLONIES/ML      Performed at [facility]   Culture     Final   Value: INSIGNIFICANT GROWTH     Performed at [facility]   Report Status 05/29/2014 FINAL   Final     Labs: Basic Metabolic Panel:  Recent Labs Lab 05/27/14 1852 05/28/14 0745  NA 140 137  K 3.7 4.2  CL 95* 102  CO2 29 28  GLUCOSE 103* 95  BUN 22 17  CREATININE 1.44* 1.44*  CALCIUM 9.5 8.4   Liver Function Tests:  Recent Labs Lab 05/27/14 1852  AST 28  ALT 22  ALKPHOS 71  BILITOT 0.6  PROT 7.9  ALBUMIN 4.4    Recent Labs Lab 05/27/14 1852  LIPASE 26   No results found for this basename: AMMONIA,  in the last 168 hours CBC:  Recent Labs Lab 05/27/14 1852 05/28/14 0155 05/28/14 0745 05/28/14 1643 05/29/14 0036  WBC 11.0*  --  7.4  --   --   NEUTROABS 9.0*  --   --   --   --   HGB 14.9 12.6* 13.1 13.1 13.8  HCT 43.6 37.9* 39.6 40.2 42.3  MCV 91.4  --  94.5  --   --   PLT 267  --  202  --   --    Cardiac Enzymes: No results found for this basename: CKTOTAL, CKMB, CKMBINDEX, TROPONINI,  in the last 168 hours BNP: BNP (last 3 results) No results found for this basename: PROBNP,  in the last 8760 hours CBG: No results found for this basename: GLUCAP,  in the last 168 hours     Signed:  Debbe Odea, MD [facility] 05/29/2014, 2:53 PM

## 2014-05-29 NOTE — Progress Notes (Signed)
[name] to be D/C'd Home per MD order.  Discussed with the patient and all questions fully answered.  VSS, Skin clean, dry and intact without evidence of skin break down, no evidence of skin tears noted. IV catheter discontinued intact. Site without signs and symptoms of complications. Dressing and pressure applied.  An After Visit Summary was printed and given to the patient.  Patient received prescriptions x3 and has match letter.  D/c education completed with patient/family including follow up instructions, medication list, d/c activities limitations if indicated, with other d/c instructions as indicated by MD - patient able to verbalize understanding, all questions fully answered.   Patient instructed to return to ED, call 911, or call MD for any changes in condition.   Patient escorted via [place], and D/C home via private auto.  Micki Riley 05/29/2014 3:20 PM

## 2014-05-29 NOTE — Progress Notes (Signed)
[facility] program letter provided to patient and explained. Pt stated understanding of process.   Sandi Mariscal, RN BSN MHA CCM  Case Manager, Trauma Service/Unit [facility] [phone]

## 2014-05-29 NOTE — Discharge Instructions (Signed)
Do not do any movements that will make your abdominal pain worse. Apply ice to the area every 6 hrs or ICY/ Hot patches.  If your run out of Protonix, you can buy over the counter Prilosec - take 40 mg daily.  Do not drink or smoke while healing is occuring and try to quit permanently. Try to avoid caffeine and spicy food as well. Try not to lay down for 30 min after your meals.

## 2014-05-30 ENCOUNTER — Encounter (HOSPITAL_COMMUNITY): Payer: Self-pay | Admitting: Gastroenterology

## 2014-06-01 ENCOUNTER — Emergency Department (HOSPITAL_COMMUNITY)
Admission: EM | Admit: 2014-06-01 | Discharge: 2014-06-01 | Disposition: A | Discharge status: Home / Self Care | Payer: Self-pay | Attending: Emergency Medicine | Admitting: Emergency Medicine

## 2014-06-01 ENCOUNTER — Emergency Department (HOSPITAL_COMMUNITY): Payer: Self-pay

## 2014-06-01 ENCOUNTER — Encounter (HOSPITAL_COMMUNITY): Payer: Self-pay | Admitting: Emergency Medicine

## 2014-06-01 DIAGNOSIS — G8929 Other chronic pain: Secondary | ICD-10-CM | POA: Insufficient documentation

## 2014-06-01 DIAGNOSIS — R109 Unspecified abdominal pain: Secondary | ICD-10-CM

## 2014-06-01 DIAGNOSIS — Z791 Long term (current) use of non-steroidal anti-inflammatories (NSAID): Secondary | ICD-10-CM | POA: Insufficient documentation

## 2014-06-01 DIAGNOSIS — R112 Nausea with vomiting, unspecified: Secondary | ICD-10-CM | POA: Insufficient documentation

## 2014-06-01 DIAGNOSIS — F17200 Nicotine dependence, unspecified, uncomplicated: Secondary | ICD-10-CM | POA: Insufficient documentation

## 2014-06-01 DIAGNOSIS — Z79899 Other long term (current) drug therapy: Secondary | ICD-10-CM | POA: Insufficient documentation

## 2014-06-01 DIAGNOSIS — K279 Peptic ulcer, site unspecified, unspecified as acute or chronic, without hemorrhage or perforation: Secondary | ICD-10-CM | POA: Insufficient documentation

## 2014-06-01 DIAGNOSIS — R1031 Right lower quadrant pain: Secondary | ICD-10-CM | POA: Insufficient documentation

## 2014-06-01 LAB — CBC WITH DIFFERENTIAL/PLATELET
BASOS ABS: 0 10*3/uL (ref 0.0–0.1)
Basophils Relative: 1 % (ref 0–1)
EOS ABS: 0.6 10*3/uL (ref 0.0–0.7)
Eosinophils Relative: 7 % — ABNORMAL HIGH (ref 0–5)
HCT: 44.6 % (ref 39.0–52.0)
Hemoglobin: 14.9 g/dL (ref 13.0–17.0)
Lymphocytes Relative: 23 % (ref 12–46)
Lymphs Abs: 1.9 10*3/uL (ref 0.7–4.0)
MCH: 31.4 pg (ref 26.0–34.0)
MCHC: 33.4 g/dL (ref 30.0–36.0)
MCV: 93.9 fL (ref 78.0–100.0)
Monocytes Absolute: 0.9 10*3/uL (ref 0.1–1.0)
Monocytes Relative: 11 % (ref 3–12)
NEUTROS ABS: 5.1 10*3/uL (ref 1.7–7.7)
NEUTROS PCT: 60 % (ref 43–77)
PLATELETS: 230 10*3/uL (ref 150–400)
RBC: 4.75 MIL/uL (ref 4.22–5.81)
RDW: 13.3 % (ref 11.5–15.5)
WBC: 8.5 10*3/uL (ref 4.0–10.5)

## 2014-06-01 LAB — URINALYSIS, ROUTINE W REFLEX MICROSCOPIC
Bilirubin Urine: NEGATIVE
Glucose, UA: NEGATIVE mg/dL
Hgb urine dipstick: NEGATIVE
Ketones, ur: NEGATIVE mg/dL
Leukocytes, UA: NEGATIVE
Nitrite: NEGATIVE
Protein, ur: NEGATIVE mg/dL
SPECIFIC GRAVITY, URINE: 1.015 (ref 1.005–1.030)
UROBILINOGEN UA: 0.2 mg/dL (ref 0.0–1.0)
pH: 8 (ref 5.0–8.0)

## 2014-06-01 LAB — RAPID URINE DRUG SCREEN, HOSP PERFORMED
Amphetamines: NOT DETECTED
BENZODIAZEPINES: NOT DETECTED
Barbiturates: NOT DETECTED
COCAINE: NOT DETECTED
OPIATES: NOT DETECTED
TETRAHYDROCANNABINOL: POSITIVE — AB

## 2014-06-01 LAB — COMPREHENSIVE METABOLIC PANEL
ALT: 18 U/L (ref 0–53)
ANION GAP: 10 (ref 5–15)
AST: 17 U/L (ref 0–37)
Albumin: 4 g/dL (ref 3.5–5.2)
Alkaline Phosphatase: 65 U/L (ref 39–117)
BUN: 13 mg/dL (ref 6–23)
CO2: 30 mEq/L (ref 19–32)
Calcium: 9.7 mg/dL (ref 8.4–10.5)
Chloride: 102 mEq/L (ref 96–112)
Creatinine, Ser: 1.19 mg/dL (ref 0.50–1.35)
GFR calc Af Amer: >90 mL/min (ref >90)
GFR calc non Af Amer: 81 mL/min — ABNORMAL LOW (ref >90)
Glucose, Bld: 97 mg/dL (ref 70–99)
POTASSIUM: 5.1 meq/L (ref 3.7–5.3)
Sodium: 142 mEq/L (ref 137–147)
TOTAL PROTEIN: 7.5 g/dL (ref 6.0–8.3)
Total Bilirubin: 0.2 mg/dL — ABNORMAL LOW (ref 0.3–1.2)

## 2014-06-01 LAB — LIPASE, BLOOD: Lipase: 30 U/L (ref 11–59)

## 2014-06-01 MED ORDER — PANTOPRAZOLE SODIUM 40 MG IV SOLR
40.0000 mg | Freq: Once | INTRAVENOUS | Status: AC
  Administered 2014-06-01: 40 mg via INTRAVENOUS
  Filled 2014-06-01: qty 40

## 2014-06-01 MED ORDER — PROMETHAZINE HCL 25 MG PO TABS: 25.0000 mg | ORAL_TABLET | Freq: Four times a day (QID) | ORAL | Status: DC | PRN

## 2014-06-01 MED ORDER — MORPHINE SULFATE 4 MG/ML IJ SOLN
4.0000 mg | Freq: Once | INTRAMUSCULAR | Status: AC
  Administered 2014-06-01: 4 mg via INTRAVENOUS
  Filled 2014-06-01: qty 1

## 2014-06-01 MED ORDER — SODIUM CHLORIDE 0.9 % IV BOLUS (SEPSIS)
1000.0000 mL | Freq: Once | INTRAVENOUS | Status: AC
  Administered 2014-06-01: 1000 mL via INTRAVENOUS

## 2014-06-01 MED ORDER — DICYCLOMINE HCL 20 MG PO TABS: 20.0000 mg | ORAL_TABLET | Freq: Two times a day (BID) | ORAL | Status: DC

## 2014-06-01 MED ORDER — ONDANSETRON HCL 4 MG/2ML IJ SOLN
4.0000 mg | Freq: Once | INTRAMUSCULAR | Status: AC
  Administered 2014-06-01: 4 mg via INTRAVENOUS
  Filled 2014-06-01: qty 2

## 2014-06-01 NOTE — ED Notes (Signed)
Pt ambulates to lobby. Request oxycodone discuss with MD. States  No further medication ordered

## 2014-06-01 NOTE — ED Provider Notes (Signed)
Medical screening examination/treatment/procedure(s) were performed by non-physician practitioner and as supervising physician I was immediately available for consultation/collaboration.   EKG Interpretation None        Wandra Arthurs, MD 06/01/14 [phone]

## 2014-06-01 NOTE — ED Provider Notes (Signed)
CSN: [id number]     Arrival date & time 06/01/14  [id number] History   First MD Initiated Contact with Patient 06/01/14 1113     Chief Complaint  Patient presents with  . Abdominal Pain  . Flank Pain     (Consider location/radiation/quality/duration/timing/severity/associated sxs/prior Treatment) HPI Comments: Patient is a 28 year old male with a past medical history of polysubstance abuse and chronic abdominal pain who presents with abdominal pain for the past 3 days. The pain is located in his generalized abdomen and does not radiate. The pain is described as achign and severe. The pain started gradually and progressively worsened since the onset. Patient was admitted to the hospital for ileus 5 days ago and discharged 3 days ago. During this stay, he had an EGD which was unremarkable. No alleviating/aggravating factors. The patient has tried nothing for symptoms without relief. Associated symptoms include nausea and vomiting. Patient denies fever, headache, diarrhea, chest pain, SOB, dysuria, constipation.   Past Medical History  Diagnosis Date  . Narcotic abuse   . Peptic ulcer    Past Surgical History  Procedure Laterality Date  . Hand surgery  2006  . Esophagogastroduodenoscopy N/A 05/29/2014    Procedure: ESOPHAGOGASTRODUODENOSCOPY (EGD) ;  Surgeon: Beryle Beams, MD;  Location: [facility] ENDOSCOPY;  Service: Endoscopy;  Laterality: N/A;  check with Dr. Collene Mares about sedation type/timinng - I recommend MAC   History reviewed. No pertinent family history. History  Substance Use Topics  . Smoking status: Current Every Day Smoker -- 0.50 packs/day    Types: Cigarettes  . Smokeless tobacco: Not on file  . Alcohol Use: 6.0 oz/week    10 Shots of liquor per week    Review of Systems  Constitutional: Negative for fever, chills and fatigue.  HENT: Negative for trouble swallowing.   Eyes: Negative for visual disturbance.  Respiratory: Negative for shortness of breath.   Cardiovascular: Negative  for chest pain and palpitations.  Gastrointestinal: Positive for nausea, vomiting and abdominal pain. Negative for diarrhea.  Genitourinary: Negative for dysuria and difficulty urinating.  Musculoskeletal: Negative for arthralgias and neck pain.  Skin: Negative for color change.  Neurological: Negative for dizziness and weakness.  Psychiatric/Behavioral: Negative for dysphoric mood.      Allergies  Review of patient's allergies indicates no known allergies.  Home Medications   Prior to Admission medications   Medication Sig Start Date End Date Taking? Authorizing Provider  bismuth subsalicylate (PEPTO BISMOL) 262 MG chewable tablet Chew 524 mg by mouth every 4 (four) hours as needed for indigestion or diarrhea or loose stools.   Yes Historical Provider, MD  diclofenac sodium (VOLTAREN) 1 % GEL Apply 4 g topically 4 (four) times daily as needed (pain).   Yes Historical Provider, MD  oxyCODONE (OXY IR/ROXICODONE) 5 MG immediate release tablet Take 1 tablet (5 mg total) by mouth every 4 (four) hours as needed for moderate pain. 05/29/14  Yes Debbe Odea, MD  pantoprazole (PROTONIX) 40 MG tablet Take 1 tablet (40 mg total) by mouth daily. 05/29/14  Yes Debbe Odea, MD   BP 121/81  Pulse 84  Temp(Src) 98.2 F (36.8 C) (Oral)  Resp 24  SpO2 98% Physical Exam  Nursing note and vitals reviewed. Constitutional: He is oriented to person, place, and time. He appears well-developed and well-nourished. No distress.  Patient appears uncomfortable.   HENT:  Head: Normocephalic and atraumatic.  Eyes: Conjunctivae are normal.  Neck: Normal range of motion.  Cardiovascular: Normal rate and regular rhythm.  Exam reveals no gallop and no friction rub.   No murmur heard. Pulmonary/Chest: Effort normal and breath sounds normal. He has no wheezes. He has no rales. He exhibits no tenderness.  Abdominal: Soft. He exhibits no distension. There is tenderness. There is no rebound and no guarding.   Generalized abdominal tenderness which is most notable in the RLQ. No peritoneal signs.   Genitourinary:  No CVA tenderness.   Musculoskeletal: Normal range of motion.  Neurological: He is alert and oriented to person, place, and time. Coordination normal.  Speech is goal-oriented. Moves limbs without ataxia.   Skin: Skin is warm and dry.  Psychiatric: He has a normal mood and affect. His behavior is normal.    ED Course  Procedures (including critical care time) Labs Review Labs Reviewed  CBC WITH DIFFERENTIAL - Abnormal; Notable for the following:    Eosinophils Relative 7 (*)    All other components within normal limits  COMPREHENSIVE METABOLIC PANEL - Abnormal; Notable for the following:    Total Bilirubin 0.2 (*)    GFR calc non Af Amer 81 (*)    All other components within normal limits  URINE RAPID DRUG SCREEN (HOSP PERFORMED) - Abnormal; Notable for the following:    Tetrahydrocannabinol POSITIVE (*)    All other components within normal limits  LIPASE, BLOOD  URINALYSIS, ROUTINE W REFLEX MICROSCOPIC    Imaging Review Dg Abd Acute W/chest  06/01/2014   CLINICAL DATA:  Abdominal pain, smoker  EXAM: ACUTE ABDOMEN SERIES (ABDOMEN 2 VIEW & CHEST 1 VIEW)  COMPARISON:  05/27/2014  FINDINGS: Cardiomediastinal silhouette is unremarkable. No acute infiltrate or pleural effusion. No pulmonary edema. There is nonspecific nonobstructive bowel gas pattern. Moderate stool throughout the colon. No free abdominal air.  IMPRESSION: Negative abdominal radiographs. No acute cardiopulmonary disease. Moderate colonic stool.   Electronically Signed   By: Lahoma Crocker M.D.   On: 06/01/2014 12:58     EKG Interpretation None      MDM   Final diagnoses:  Chronic abdominal pain    11:51 AM Labs unremarkable for acute changes. Vitals stable and patient afebrile. Patient will have morphine, protonix, and zofran for symptoms.   2:42 PM Patient reports improvements of symptoms. Patient has  chronic abdominal pain. Labs are unchanged and show no signs of acute changes. Acute abdominal series shows moderate stool without other acute findings. Patient will be discharged with Bentyl and Phenergan. Patient will have follow up with GI. Vitals stable and patient afebrile.    Alvina Chou, PA-C 06/01/14 1447

## 2014-06-01 NOTE — ED Notes (Signed)
Pt given a warm blanket 

## 2014-06-01 NOTE — Discharge Instructions (Signed)
Take Bentyl as needed for abdominal pain. Take phenergan as needed for nausea. Refer to attached documents for more information. Follow up with the recommended GI doctor.

## 2014-06-01 NOTE — ED Notes (Signed)
Pt c/o upper abd pain and bilateral flank pain; pt seen here recently for same; pt sts some N/V

## 2014-11-02 ENCOUNTER — Emergency Department (HOSPITAL_COMMUNITY)
Admission: EM | Admit: 2014-11-02 | Discharge: 2014-11-02 | Disposition: A | Discharge status: Home / Self Care | Payer: Self-pay | Attending: Emergency Medicine | Admitting: Emergency Medicine

## 2014-11-02 ENCOUNTER — Emergency Department (HOSPITAL_COMMUNITY): Payer: Self-pay

## 2014-11-02 ENCOUNTER — Encounter (HOSPITAL_COMMUNITY): Payer: Self-pay | Admitting: Cardiology

## 2014-11-02 DIAGNOSIS — M545 Low back pain, unspecified: Secondary | ICD-10-CM

## 2014-11-02 DIAGNOSIS — Z720 Tobacco use: Secondary | ICD-10-CM | POA: Insufficient documentation

## 2014-11-02 DIAGNOSIS — R1084 Generalized abdominal pain: Secondary | ICD-10-CM | POA: Insufficient documentation

## 2014-11-02 DIAGNOSIS — Z79899 Other long term (current) drug therapy: Secondary | ICD-10-CM | POA: Insufficient documentation

## 2014-11-02 DIAGNOSIS — Z8711 Personal history of peptic ulcer disease: Secondary | ICD-10-CM | POA: Insufficient documentation

## 2014-11-02 DIAGNOSIS — R109 Unspecified abdominal pain: Secondary | ICD-10-CM

## 2014-11-02 LAB — I-STAT CHEM 8, ED
BUN: 12 mg/dL (ref 6–23)
Calcium, Ion: 1.21 mmol/L (ref 1.12–1.23)
Chloride: 103 mEq/L (ref 96–112)
Creatinine, Ser: 1.1 mg/dL (ref 0.50–1.35)
Glucose, Bld: 102 mg/dL — ABNORMAL HIGH (ref 70–99)
HCT: 50 % (ref 39.0–52.0)
Hemoglobin: 17 g/dL (ref 13.0–17.0)
POTASSIUM: 4.1 mmol/L (ref 3.5–5.1)
Sodium: 141 mmol/L (ref 135–145)
TCO2: 22 mmol/L (ref 0–100)

## 2014-11-02 LAB — URINALYSIS, ROUTINE W REFLEX MICROSCOPIC
BILIRUBIN URINE: NEGATIVE
Glucose, UA: NEGATIVE mg/dL
HGB URINE DIPSTICK: NEGATIVE
KETONES UR: NEGATIVE mg/dL
LEUKOCYTES UA: NEGATIVE
Nitrite: NEGATIVE
PROTEIN: NEGATIVE mg/dL
Specific Gravity, Urine: 1.015 (ref 1.005–1.030)
Urobilinogen, UA: 0.2 mg/dL (ref 0.0–1.0)
pH: 6.5 (ref 5.0–8.0)

## 2014-11-02 LAB — CBC WITH DIFFERENTIAL/PLATELET
BASOS ABS: 0 10*3/uL (ref 0.0–0.1)
Basophils Relative: 0 % (ref 0–1)
Eosinophils Absolute: 0.1 10*3/uL (ref 0.0–0.7)
Eosinophils Relative: 1 % (ref 0–5)
HCT: 46.2 % (ref 39.0–52.0)
HEMOGLOBIN: 15.6 g/dL (ref 13.0–17.0)
LYMPHS PCT: 15 % (ref 12–46)
Lymphs Abs: 1.5 10*3/uL (ref 0.7–4.0)
MCH: 31.5 pg (ref 26.0–34.0)
MCHC: 33.8 g/dL (ref 30.0–36.0)
MCV: 93.3 fL (ref 78.0–100.0)
Monocytes Absolute: 0.7 10*3/uL (ref 0.1–1.0)
Monocytes Relative: 7 % (ref 3–12)
NEUTROS PCT: 77 % (ref 43–77)
Neutro Abs: 7.9 10*3/uL — ABNORMAL HIGH (ref 1.7–7.7)
Platelets: 207 10*3/uL (ref 150–400)
RBC: 4.95 MIL/uL (ref 4.22–5.81)
RDW: 13.2 % (ref 11.5–15.5)
WBC: 10.2 10*3/uL (ref 4.0–10.5)

## 2014-11-02 MED ORDER — METHOCARBAMOL 750 MG PO TABS: 750.0000 mg | ORAL_TABLET | Freq: Four times a day (QID) | ORAL | Status: DC | PRN

## 2014-11-02 MED ORDER — LORAZEPAM 2 MG/ML IJ SOLN: 1.0000 mg | Freq: Once | INTRAMUSCULAR | Status: DC

## 2014-11-02 MED ORDER — IOHEXOL 300 MG/ML  SOLN
25.0000 mL | INTRAMUSCULAR | Status: AC
Start: 2014-11-02 — End: 2014-11-02
  Administered 2014-11-02: 25 mL via ORAL

## 2014-11-02 MED ORDER — IOHEXOL 300 MG/ML  SOLN
100.0000 mL | Freq: Once | INTRAMUSCULAR | Status: AC | PRN
  Administered 2014-11-02: 100 mL via INTRAVENOUS

## 2014-11-02 MED ORDER — DIAZEPAM 5 MG PO TABS
5.0000 mg | ORAL_TABLET | Freq: Once | ORAL | Status: AC
  Administered 2014-11-02: 5 mg via ORAL
  Filled 2014-11-02: qty 1

## 2014-11-02 MED ORDER — KETOROLAC TROMETHAMINE 60 MG/2ML IM SOLN
60.0000 mg | Freq: Once | INTRAMUSCULAR | Status: AC
  Administered 2014-11-02: 60 mg via INTRAMUSCULAR
  Filled 2014-11-02: qty 2

## 2014-11-02 MED ORDER — MORPHINE SULFATE 4 MG/ML IJ SOLN
4.0000 mg | Freq: Once | INTRAMUSCULAR | Status: AC
  Administered 2014-11-02: 4 mg via INTRAVENOUS
  Filled 2014-11-02: qty 1

## 2014-11-02 MED ORDER — HYDROCODONE-ACETAMINOPHEN 5-325 MG PO TABS: 1.0000 | ORAL_TABLET | ORAL | Status: DC | PRN

## 2014-11-02 NOTE — ED Notes (Signed)
Pt reports lower back pain that started a couple of days ago. Pt reports that he tried OTC medication without much relief.

## 2014-11-02 NOTE — ED Notes (Signed)
Declined W/C at D/C and was escorted to lobby by RN. 

## 2014-11-02 NOTE — ED Notes (Signed)
Pt stated he can not use the BR to obtain a urine sample at this time.

## 2014-11-02 NOTE — ED Provider Notes (Signed)
CSN: [id number]     Arrival date & time 11/02/14  1249 History   First MD Initiated Contact with Patient 11/02/14 1608     This chart was scribed for non-physician practitioner, Clayton Bibles PA-C working with Virgel Manifold, MD by Forrestine Him, ED Scribe. This patient was seen in room TR09C/TR09C and the patient's care was started at 4:29 PM.   Chief Complaint  Patient presents with  . Back Pain   The history is provided by the patient. No language interpreter was used.    HPI Comments: Brian [name] is a 29 y.o. male who presents to the Emergency Department complaining of constant, moderate lower back pain, L sided worse than R x 1.5 weeks that has progressively worsened. Pain radiates to L buttock and leg intermittently. No recent injury or fall. Discomfort is rated 8/10. Pt attributes pain to heavy lifting while at work; no specific encounter. Pt states pain is exacerbated with activity and standing. [name] also admits to paresthesia to the toes intermittently and weakness to LLE. He has tried OTC Ibuprofen and heat application without any improvement for symptoms. No recent fever, chills, abdominal pain, diaphoresis, SOB, or CP. He denies any dysuria, hematuria, urinary frequency or urinary urgency. No bowel or urinary incontinence or saddle anesthesia. Pt has missed a week of work secondary to symptoms. No history of previous back pain. He denies any previous history of stomach ulcers. No history of kidney stones. No known allergies to medications.  Past Medical History  Diagnosis Date  . Narcotic abuse   . Peptic ulcer    Past Surgical History  Procedure Laterality Date  . Hand surgery  2006  . Esophagogastroduodenoscopy N/A 05/29/2014    Procedure: ESOPHAGOGASTRODUODENOSCOPY (EGD) ;  Surgeon: Beryle Beams, MD;  Location: [facility] ENDOSCOPY;  Service: Endoscopy;  Laterality: N/A;  check with Dr. Collene Mares about sedation type/timinng - I recommend MAC   History reviewed. No pertinent family  history. History  Substance Use Topics  . Smoking status: Current Every Day Smoker -- 0.50 packs/day    Types: Cigarettes  . Smokeless tobacco: Not on file  . Alcohol Use: 6.0 oz/week    10 Shots of liquor per week    Review of Systems  Constitutional: Negative for fever and chills.  Respiratory: Negative for shortness of breath.   Cardiovascular: Negative for chest pain.  Gastrointestinal: Negative for nausea and vomiting.  Musculoskeletal: Positive for back pain.  Neurological: Negative for dizziness, weakness and numbness.  All other systems reviewed and are negative.     Allergies  Review of patient's allergies indicates no known allergies.  Home Medications   Prior to Admission medications   Medication Sig Start Date End Date Taking? Authorizing Provider  bismuth subsalicylate (PEPTO BISMOL) 262 MG chewable tablet Chew 524 mg by mouth every 4 (four) hours as needed for indigestion or diarrhea or loose stools.    Historical Provider, MD  diclofenac sodium (VOLTAREN) 1 % GEL Apply 4 g topically 4 (four) times daily as needed (pain).    Historical Provider, MD  dicyclomine (BENTYL) 20 MG tablet Take 1 tablet (20 mg total) by mouth 2 (two) times daily. 06/01/14   Alvina Chou, PA-C  oxyCODONE (OXY IR/ROXICODONE) 5 MG immediate release tablet Take 1 tablet (5 mg total) by mouth every 4 (four) hours as needed for moderate pain. 05/29/14   Debbe Odea, MD  pantoprazole (PROTONIX) 40 MG tablet Take 1 tablet (40 mg total) by mouth daily. 05/29/14  Debbe Odea, MD  promethazine (PHENERGAN) 25 MG tablet Take 1 tablet (25 mg total) by mouth every 6 (six) hours as needed for nausea or vomiting. 06/01/14   Alvina Chou, PA-C   Triage Vitals: BP 122/79 mmHg  Pulse 105  Temp(Src) 97.8 F (36.6 C) (Oral)  Resp 18  Ht 5\' 8"  (1.727 m)  Wt 160 lb (72.576 kg)  BMI 24.33 kg/m2  SpO2 99%   Physical Exam  Constitutional: He appears well-developed and well-nourished. No distress.   HENT:  Head: Normocephalic and atraumatic.  Neck: Neck supple.  Pulmonary/Chest: Effort normal.  Abdominal: Soft. He exhibits no distension. There is tenderness. There is no rebound and no guarding.  Diffuse abdominal tenderness Tenderness to palpation over LUQ and LLQ  Musculoskeletal:  Spine nontender, no crepitus, or stepoffs. Lower extremities:  Strength 5/5, sensation intact, distal pulses intact.    Tender throughout L, mid, and lower musculature   Neurological: He is alert.  L sided lower extremity weakness in comparison to R but unclear if secondary to pain or true weakness  Skin: He is not diaphoretic.  Nursing note and vitals reviewed.   ED Course  Procedures (including critical care time)  DIAGNOSTIC STUDIES: Oxygen Saturation is 99% on RA, Normal by my interpretation.    COORDINATION OF CARE: 4:31 PM-Discussed treatment plan with pt at bedside and pt agreed to plan.     Labs Review Labs Reviewed  CBC WITH DIFFERENTIAL - Abnormal; Notable for the following:    Neutro Abs 7.9 (*)    All other components within normal limits  I-STAT CHEM 8, ED - Abnormal; Notable for the following:    Glucose, Bld 102 (*)    All other components within normal limits  URINALYSIS, ROUTINE W REFLEX MICROSCOPIC    Imaging Review Ct Abdomen Pelvis W Contrast  11/02/2014   CLINICAL DATA:  Left lower quadrant and back pain for 1 week  EXAM: CT ABDOMEN AND PELVIS WITH CONTRAST  TECHNIQUE: Multidetector CT imaging of the abdomen and pelvis was performed using the standard protocol following bolus administration of intravenous contrast.  CONTRAST:  18mL OMNIPAQUE IOHEXOL 300 MG/ML  SOLN  COMPARISON:  May 27, 2014  FINDINGS: The liver, spleen, pancreas, gallbladder, adrenal glands and kidneys are normal. There is no hydronephrosis bilaterally. The aorta is normal. There is no abdominal lymphadenopathy. There is no small bowel obstruction or diverticulitis. The appendix is normal.  Partial  decompressed bladder is unremarkable. There is no pelvic lymphadenopathy. The visualized lung bases are clear. No acute fracture dislocation is identified within the visualized bones. There is minimal decreased intervertebral space at L5-S1. Mild decreased intervertebral space is also identified in the lower lobes thoracic spine.  IMPRESSION: No acute abnormality identified in the abdomen and pelvis. There is no evidence of diverticulitis or bowel obstruction. Minimal degenerative joint changes of the lower lumbar spine.   Electronically Signed   By: Abelardo Diesel M.D.   On: 11/02/2014 19:35     EKG Interpretation None       Discussed pain control with the patient.  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  He feels that he has control over this and has no problem with abuse, needs pain control.  [place] checked.  Pt has only 2 prescriptions for controlled substances in the past 6 months, August, and December.  10/23/14, was given #15.    MDM   Final diagnoses:  Left low back pain  Left sided  abdominal pain    Afebrile, nontoxic patient with left lower back pain with radiation into left buttock.  I was somewhat concerned because while he is tender int the musculature of his back he was also tender in his left abdomen . He denies abdominal pain when not being examined.  Ct abd/pelvis unremarkable.  UA unremarkable.  Labs unremarkable.  No red flags for back pain.  Pt slightly decreased strength in left lower extremity but this improved with pain control and patient was able to ambulate normally around the department.  D/C home with norco, robaxin, close PCP follow up.  Discussed result, findings, treatment, and follow up  with patient.  Pt given return precautions.  Pt verbalizes understanding and agrees with plan.       I personally performed the services described in this documentation, which was scribed in my presence. The recorded information has been reviewed and is  accurate.    Clayton Bibles, PA-C 11/02/14 2139  Virgel Manifold, MD 11/04/14 [phone]

## 2014-11-02 NOTE — Discharge Instructions (Signed)
Read the information below.  Use the prescribed medication as directed.  Please discuss all new medications with your pharmacist.  Do not take additional tylenol while taking the prescribed pain medication to avoid overdose.  You may return to the Emergency Department at any time for worsening condition or any new symptoms that concern you.   If you develop fevers, loss of control of bowel or bladder, weakness or numbness in your legs, or are unable to walk, return to the ER for a recheck.   Back Pain, Adult Low back pain is very common. About 1 in 5 people have back pain.The cause of low back pain is rarely dangerous. The pain often gets better over time.About half of people with a sudden onset of back pain feel better in just 2 weeks. About 8 in 10 people feel better by 6 weeks.  CAUSES Some common causes of back pain include:  Strain of the muscles or ligaments supporting the spine.  Wear and tear (degeneration) of the spinal discs.  Arthritis.  Direct injury to the back. DIAGNOSIS Most of the time, the direct cause of low back pain is not known.However, back pain can be treated effectively even when the exact cause of the pain is unknown.Answering your caregiver's questions about your overall health and symptoms is one of the most accurate ways to make sure the cause of your pain is not dangerous. If your caregiver needs more information, he or she may order lab work or imaging tests (X-rays or MRIs).However, even if imaging tests show changes in your back, this usually does not require surgery. HOME CARE INSTRUCTIONS For many people, back pain returns.Since low back pain is rarely dangerous, it is often a condition that people can learn to [facility] their own.   Remain active. It is stressful on the back to sit or stand in one place. Do not sit, drive, or stand in one place for more than 30 minutes at a time. Take short walks on level surfaces as soon as pain allows.Try to increase the  length of time you walk each day.  Do not stay in bed.Resting more than 1 or 2 days can delay your recovery.  Do not avoid exercise or work.Your body is made to move.It is not dangerous to be active, even though your back may hurt.Your back will likely heal faster if you return to being active before your pain is gone.  Pay attention to your body when you bend and lift. Many people have less discomfortwhen lifting if they bend their knees, keep the load close to their bodies,and avoid twisting. Often, the most comfortable positions are those that put less stress on your recovering back.  Find a comfortable position to sleep. Use a firm mattress and lie on your side with your knees slightly bent. If you lie on your back, put a pillow under your knees.  Only take over-the-counter or prescription medicines as directed by your caregiver. Over-the-counter medicines to reduce pain and inflammation are often the most helpful.Your caregiver may prescribe muscle relaxant drugs.These medicines help dull your pain so you can more quickly return to your normal activities and healthy exercise.  Put ice on the injured area.  Put ice in a plastic bag.  Place a towel between your skin and the bag.  Leave the ice on for 15-20 minutes, 03-04 times a day for the first 2 to 3 days. After that, ice and heat may be alternated to reduce pain and spasms.  Ask  your caregiver about trying back exercises and gentle massage. This may be of some benefit.  Avoid feeling anxious or stressed.Stress increases muscle tension and can worsen back pain.It is important to recognize when you are anxious or stressed and learn ways to manage it.Exercise is a great option. SEEK MEDICAL CARE IF:  You have pain that is not relieved with rest or medicine.  You have pain that does not improve in 1 week.  You have new symptoms.  You are generally not feeling well. SEEK IMMEDIATE MEDICAL CARE IF:   You have pain that  radiates from your back into your legs.  You develop new bowel or bladder control problems.  You have unusual weakness or numbness in your arms or legs.  You develop nausea or vomiting.  You develop abdominal pain.  You feel faint. Document Released: 10/13/2005 Document Revised: 04/13/2012 Document Reviewed: 02/14/2014 [facility] Patient Information 2015 [place], [place]. This information is not intended to replace advice given to you by your health care provider. Make sure you discuss any questions you have with your health care provider.   Emergency Department Resource Guide 1) Find a Doctor and Pay Out of Pocket Although you won't have to find out who is covered by your insurance plan, it is a good idea to ask around and get recommendations. You will then need to call the office and see if the doctor you have chosen will accept you as a new patient and what types of options they offer for patients who are self-pay. Some doctors offer discounts or will set up payment plans for their patients who do not have insurance, but you will need to ask so you aren't surprised when you get to your appointment.  2) Contact Your Local Health Department Not all health departments have doctors that can see patients for sick visits, but many do, so it is worth a call to see if yours does. If you don't know where your local health department is, you can check in your phone book. The CDC also has a tool to help you locate your state's health department, and many state websites also have listings of all of their local health departments.  3) Find a [facility] If your illness is not likely to be very severe or complicated, you may want to try a walk in clinic. These are popping up all over the country in pharmacies, drugstores, and shopping centers. They're usually staffed by nurse practitioners or physician assistants that have been trained to treat common illnesses and complaints. They're usually fairly  quick and inexpensive. However, if you have serious medical issues or chronic medical problems, these are probably not your best option.  No Primary Care Doctor: - Call Health Connect at  [phone] - they can help you locate a primary care doctor that  accepts your insurance, provides certain services, etc. - Physician Referral Service- [phone]  Chronic Pain Problems: Organization         Address  Phone   Notes  [facility]  [phone] Patients need to be referred by their primary care doctor.   Medication Assistance: Organization         Address  Phone   Notes  [facility] Medication [facility] [address] [place], [place] [address] [phone] --Must be a resident of [facility] -- Must have NO insurance coverage whatsoever (no Medicaid/ Medicare, etc.) -- The pt. MUST have a primary care doctor that directs their  care regularly and follows them in the community   MedAssist  [phone]   [employer]  [phone]    Agencies that provide inexpensive medical care: Organization         Address  Phone   Notes  [place]  [phone]   [facility]    [phone]   [facility] [place], [place] [address] [phone]   [place] [address], [place] [phone]   Planned Parenthood    [phone]   [facility]    [phone]   [place] of [place] and [place] [address], [place] Phone:  [phone], Fax:  [phone] Hours of Operation:  9 am - 6 pm, M-F.  Also accepts Medicaid/Medicare and self-pay.  [facility] for [place], [address], [place] Phone: [phone], Fax: [phone]. Hours of Operation:  8:30 am - 5:30 pm, M-F.  Also accepts Medicaid and self-pay.  [facility] [place] [address] [place] Phone: [phone]    [place], [place], [place] [phone], Ext. [phone] Mondays & Thursdays: 7-9 AM.  First 15 patients are seen on a first come, first serve basis.    [place] Providers:  Organization         Address  Phone   Notes  [facility] [address],  [phone] Also accepts self-pay patients.  [facility] [id number] [place], [place]  [phone]   [place], [address], [place] [phone]   [facility] [address], [place] [phone]   [name] [address], [place]   [phone] Only accepts [place] Access [place] patients after they have their name applied to their card.   Self-Pay (no insurance) in [facility]:  Organization         Address  Phone   Notes  Sickle Cell Patients, [facility] [place] [phone]   [facility] [place] [phone]   [facility] [place]  [place], [place],  [phone]   [facility]/Dr. Osei-Bonsu  [address] [place] or [address], [place] [phone] Phone number for both [place] and [place] locations is the same.  [facility]gent Medical and [facility] [address], [place] [phone]   [facility] [address], [place] or [address] [phone]   [facility] [address] [place] [phone], phone; [phone], fax Sees patients 1st and 3rd Saturday of every month.  Must not qualify for public or private insurance (i.e. Medicaid, Medicare, [place] Health Choice, Veterans' Benefits)  Household income should be no more than 200% of the poverty level The clinic cannot treat you if you are  pregnant or think you are pregnant  Sexually transmitted diseases are not treated at the clinic.    Dental Care: Organization         Address  Phone  Notes  [facility] Department of [facility] [address] [place], [place] [phone] Accepts children up to age  21 who are enrolled in Medicaid or [place] Health Choice; pregnant women with a Medicaid card; and children who have applied for Medicaid or [place] Health Choice, but were declined, whose parents can pay a reduced fee at time of service.  [facility] Department of [facility]  [address], [place] [phone] Accepts children up to age 59 who are enrolled in [place] or [place]; pregnant women with a Medicaid card; and children who have applied for Medicaid or [place] Health Choice, but were declined, whose parents can pay a reduced fee at time of service.  [place] Adult Dental Access PROGRAM  [place] [phone] Patients are seen by appointment only. Walk-ins are not accepted. [place] will see patients 1 years of age and older. Monday - Tuesday (8am-5pm) Most Wednesdays (8:30-5pm) $30 per visit, cash only  [facility] Adult Dental Access PROGRAM  [address], [facility] [phone] Patients are seen by appointment only. Walk-ins are not accepted. [place] will see patients 25 years of age and older. One Wednesday Evening (Monthly: Volunteer Based).  $30 per visit, cash only  [place]  [phone] for adults; Children under age 33, call Graduate Pediatric Dentistry at [phone]. Children aged 56-14, please call [phone] to request a pediatric application.  Dental services are provided in all areas of dental care including fillings, crowns and bridges, complete and partial dentures, implants, gum treatment, root canals, and extractions. Preventive care is also provided. Treatment is provided to  both adults and children. Patients are selected via a lottery and there is often a waiting list.   [facility] [address], [place]  [phone] [email or web address]   [facility] [address] [place], [place] [phone], Ext. [phone] Second and Fourth Thursday of each month, opens at 6:30 AM; Clinic ends at 9 AM.  Patients are seen on a first-come first-served basis, and a limited number are seen during each clinic.   [facility]  [address] Hillard Danker [place], [place] [phone]   Eligibility Requirements You must have lived in [place], [place], or [place] counties for at least the last three months.   You cannot be eligible for state or federal sponsored [employer], including [employer], [place], or [employer].   You generally cannot be eligible for healthcare insurance through your employer.    How to apply: Eligibility screenings are held every Tuesday and Wednesday afternoon from 1:00 pm until 4:00 pm. You do not need an appointment for the interview!  [facility] [address] [place], [place]   [place]  [place] Department  [place]  [phone]    Behavioral Health Resources in the Community: Intensive Outpatient Programs Organization         Address  Phone  Notes  [place]. [address], [place], [place] [phone]   [facility] Outpatient [address] [place], [place]   ADS: Alcohol & Drug Svcs [address] [place], [place]   [place] [address],  [place], [place] or [phone]   Substance Abuse Resources Organization         Address  Phone  Notes  Alcohol and Drug Services  [phone]   [facility]  [place]   Chinita Pester  [phone]   Residential & Outpatient Substance Abuse Program  [phone]   Psychological Services Organization         Address  Phone  Notes  [place]  [place]  [phone]   [place] [address], [place] or [phone]    Mobile Crisis Teams Organization         Address  Phone  Notes  [facility], Mobile Crisis Care Unit  [phone]   [facility]  [address] [place], [place]   [place] [address] [place] [phone]    Self-Help/Support Groups Organization         Address  Phone             Notes  [place]. of [place] - variety of support groups  [place] Call for more information  Narcotics Anonymous (NA), [facility] [address], [employer]   2 meetings at this location   Special educational needs teacher         Address  Phone  Notes  ASAP Residential Treatment [place],    [place]  [phone]   [facility]  [address], [place] [id number], [place], [place]   [place], [place] [phone] Admissions: 8am-3pm M-F  Incentives Substance [place] [address]    [place], [place] [phone]   [facility] [address] [place], [place], [place]   [facility] [address]  [place], [place]   [facility] - Intensive Outpatient [address] Kristeen [address], [place], [place]   [facility] ([address] [place].,  [place], [place] [phone] or [phone]   [facility] Services (RTS) [address] [place], [place] Accepts Medicaid  [facility] [place] [address]  [place] [phone] Substance Abuse/Addiction Treatment   [facility] Organization         Address  Phone  Notes  [facility]  [phone]   Domenic Schwab, PhD [address] Arlis [place], [place]   [phone] or [phone]   [place], [place] [phone]   [facility] [address], [place], [place] [phone] Insurance/Medicaid/sponsorship through [facility] and Families [address] [place]                                    [place], [place] [phone] [place] [address], [place] [phone]    Dr. Adele Schilder  [phone]   [facility] Dept. 1) [address], [place] 2) [place] 3)  [address], [place] [phone]  [phone]   [place] [phone] or [phone] (After Hours)

## 2014-12-26 ENCOUNTER — Encounter (HOSPITAL_COMMUNITY): Payer: Self-pay | Admitting: Emergency Medicine

## 2014-12-26 ENCOUNTER — Emergency Department (HOSPITAL_COMMUNITY)
Admission: EM | Admit: 2014-12-26 | Discharge: 2014-12-26 | Disposition: A | Discharge status: Home / Self Care | Payer: Self-pay | Attending: Emergency Medicine | Admitting: Emergency Medicine

## 2014-12-26 DIAGNOSIS — Z720 Tobacco use: Secondary | ICD-10-CM | POA: Insufficient documentation

## 2014-12-26 DIAGNOSIS — Z8711 Personal history of peptic ulcer disease: Secondary | ICD-10-CM | POA: Insufficient documentation

## 2014-12-26 DIAGNOSIS — Z87448 Personal history of other diseases of urinary system: Secondary | ICD-10-CM | POA: Insufficient documentation

## 2014-12-26 DIAGNOSIS — Z79899 Other long term (current) drug therapy: Secondary | ICD-10-CM | POA: Insufficient documentation

## 2014-12-26 DIAGNOSIS — M5442 Lumbago with sciatica, left side: Secondary | ICD-10-CM | POA: Insufficient documentation

## 2014-12-26 HISTORY — DX: Disorder of kidney and ureter, unspecified: N28.9

## 2014-12-26 MED ORDER — DIAZEPAM 5 MG PO TABS: ORAL_TABLET | ORAL | Status: DC

## 2014-12-26 MED ORDER — DEXAMETHASONE 4 MG PO TABS: 4.0000 mg | ORAL_TABLET | Freq: Two times a day (BID) | ORAL | Status: DC

## 2014-12-26 MED ORDER — ACETAMINOPHEN-CODEINE #3 300-30 MG PO TABS: 1.0000 | ORAL_TABLET | Freq: Four times a day (QID) | ORAL | Status: DC | PRN

## 2014-12-26 NOTE — ED Notes (Signed)
PT c/o lower back pain worsening x2 weeks that radiates into his right leg. Pt denies any injury to back. PT denies any urinary symptoms.

## 2014-12-26 NOTE — ED Provider Notes (Signed)
CSN: [id number]     Arrival date & time 12/26/14  0940 History   First MD Initiated Contact with Patient 12/26/14 1103     Chief Complaint  Patient presents with  . Back Pain     (Consider location/radiation/quality/duration/timing/severity/associated sxs/prior Treatment) Patient is a 29 y.o. male presenting with back pain. The history is provided by the patient.  Back Pain Location:  Lumbar spine Quality:  Aching and cramping Radiates to:  R thigh Pain severity:  Moderate Pain is:  Same all the time Onset quality:  Gradual Duration:  2 weeks Timing:  Intermittent Progression:  Worsening Chronicity:  Chronic Relieved by:  Nothing Ineffective treatments:  NSAIDs Associated symptoms: no abdominal pain, no bladder incontinence, no bowel incontinence, no chest pain, no dysuria and no perianal numbness   Risk factors: no recent surgery     Past Medical History  Diagnosis Date  . Narcotic abuse   . Peptic ulcer   . Renal disorder     kidney stones   Past Surgical History  Procedure Laterality Date  . Hand surgery  2006  . Esophagogastroduodenoscopy N/A 05/29/2014    Procedure: ESOPHAGOGASTRODUODENOSCOPY (EGD) ;  Surgeon: Beryle Beams, MD;  [facility];  Service: Endoscopy;  Laterality: N/A;  check with Dr. Collene Mares about sedation type/timinng - I recommend MAC   No family history on file. History  Substance Use Topics  . Smoking status: Current Every Day Smoker -- 0.50 packs/day    Types: Cigarettes  . Smokeless tobacco: Not on file  . Alcohol Use: 6.0 oz/week    10 Shots of liquor per week     Comment: a few times a week    Review of Systems  Constitutional: Negative for activity change.       All ROS Neg except as noted in HPI  HENT: Negative.   Eyes: Negative for photophobia and discharge.  Respiratory: Negative for cough, shortness of breath and wheezing.   Cardiovascular: Negative for chest pain and palpitations.  Gastrointestinal: Negative for  abdominal pain, blood in stool and bowel incontinence.  Genitourinary: Negative for bladder incontinence, dysuria, frequency and hematuria.  Musculoskeletal: Positive for back pain. Negative for arthralgias and neck pain.  Skin: Negative.   Neurological: Negative for dizziness, seizures and speech difficulty.  Psychiatric/Behavioral: Negative for hallucinations and confusion.      Allergies  Review of patient's allergies indicates no known allergies.  Home Medications   Prior to Admission medications   Medication Sig Start Date End Date Taking? Authorizing Provider  ibuprofen (ADVIL,MOTRIN) 200 MG tablet Take 400 mg by mouth every 6 (six) hours as needed (pain).   Yes Historical Provider, MD  bismuth subsalicylate (PEPTO BISMOL) 262 MG chewable tablet Chew 524 mg by mouth every 4 (four) hours as needed for indigestion or diarrhea or loose stools.    Historical Provider, MD  dicyclomine (BENTYL) 20 MG tablet Take 1 tablet (20 mg total) by mouth 2 (two) times daily. Patient not taking: Reported on 12/26/2014 06/01/14   Alvina Chou, PA-C  HYDROcodone-acetaminophen (NORCO/VICODIN) 5-325 MG per tablet Take 1-2 tablets by mouth every 4 (four) hours as needed for moderate pain or severe pain. Patient not taking: Reported on 12/26/2014 11/02/14   Clayton Bibles, PA-C  methocarbamol (ROBAXIN) 750 MG tablet Take 1 tablet (750 mg total) by mouth every 6 (six) hours as needed for muscle spasms (and pain). Patient not taking: Reported on 12/26/2014 11/02/14   Clayton Bibles, PA-C  oxyCODONE (OXY IR/ROXICODONE) 5 MG  immediate release tablet Take 1 tablet (5 mg total) by mouth every 4 (four) hours as needed for moderate pain. Patient not taking: Reported on 12/26/2014 05/29/14   Debbe Odea, MD  pantoprazole (PROTONIX) 40 MG tablet Take 1 tablet (40 mg total) by mouth daily. Patient not taking: Reported on 12/26/2014 05/29/14   Debbe Odea, MD  promethazine (PHENERGAN) 25 MG tablet Take 1 tablet (25 mg total) by mouth  every 6 (six) hours as needed for nausea or vomiting. Patient not taking: Reported on 12/26/2014 06/01/14   Kaitlyn Szekalski, PA-C   BP 140/84 mmHg  Pulse 108  Temp(Src) 98.3 F (36.8 C) (Oral)  Resp 18  Ht 5\' 8"  (1.727 m)  Wt 160 lb (72.576 kg)  BMI 24.33 kg/m2  SpO2 100% Physical Exam  Constitutional: He is oriented to person, place, and time. He appears well-developed and well-nourished.  Non-toxic appearance.  HENT:  Head: Normocephalic.  Right Ear: Tympanic membrane and external ear normal.  Left Ear: Tympanic membrane and external ear normal.  Eyes: EOM and lids are normal. Pupils are equal, round, and reactive to light.  Neck: Normal range of motion. Neck supple. Carotid bruit is not present.  Cardiovascular: Normal rate, regular rhythm, normal heart sounds, intact distal pulses and normal pulses.   Pulmonary/Chest: Breath sounds normal. No respiratory distress.  Abdominal: Soft. Bowel sounds are normal. There is no tenderness. There is no guarding.  Musculoskeletal: He exhibits tenderness.       Lumbar back: He exhibits decreased range of motion, tenderness, pain and spasm.  Lumbar area pain radiates to the left leg. SLR pain at 35 degrees.  Lymphadenopathy:       Head (right side): No submandibular adenopathy present.       Head (left side): No submandibular adenopathy present.    He has no cervical adenopathy.  Neurological: He is alert and oriented to person, place, and time. He has normal strength. No cranial nerve deficit or sensory deficit.  Skin: Skin is warm and dry.  Psychiatric: He has a normal mood and affect. His speech is normal.  Nursing note and vitals reviewed.   ED Course  Procedures (including critical care time) Labs Review Labs Reviewed - No data to display  Imaging Review No results found.   EKG Interpretation None      MDM  Advised pt of need to see PCP for assistance with pain control. No gross neuo deficit noted. No recent trauma. Rx  for valium, decadron and tylenol #3 given.   Final diagnoses:  None    *I have reviewed nursing notes, vital signs, and all appropriate lab and imaging results for this patient.[address], PA-C 12/26/14 1731  Richarda Blade, MD 12/27/14 1105

## 2014-12-26 NOTE — Discharge Instructions (Signed)
Please see Dr Alvan Dame, or the orthopedic MD of your choice for evaluation of your back. No acute neurologic changes noted in the Emergency Dept. Please see orthopedics for evaluation. Use medications as suggested. Tylenol codeine and valium may cause drowsiness, please use with caution. This medication may cause drowsiness. Please do not drink, drive, or participate in activity that requires concentration while taking this medication.

## 2014-12-26 NOTE — ED Notes (Signed)
Pt states he has crutches at home and will use those rather than get a new pair .

## 2015-01-01 ENCOUNTER — Emergency Department (HOSPITAL_COMMUNITY): Payer: Self-pay

## 2015-01-01 ENCOUNTER — Encounter (HOSPITAL_COMMUNITY): Payer: Self-pay | Admitting: Emergency Medicine

## 2015-01-01 ENCOUNTER — Emergency Department (HOSPITAL_COMMUNITY)
Admission: EM | Admit: 2015-01-01 | Discharge: 2015-01-01 | Disposition: A | Discharge status: Home / Self Care | Payer: Self-pay | Attending: Emergency Medicine | Admitting: Emergency Medicine

## 2015-01-01 DIAGNOSIS — M545 Low back pain, unspecified: Secondary | ICD-10-CM

## 2015-01-01 DIAGNOSIS — L03317 Cellulitis of buttock: Secondary | ICD-10-CM | POA: Insufficient documentation

## 2015-01-01 DIAGNOSIS — K6289 Other specified diseases of anus and rectum: Secondary | ICD-10-CM | POA: Insufficient documentation

## 2015-01-01 DIAGNOSIS — Z87442 Personal history of urinary calculi: Secondary | ICD-10-CM | POA: Insufficient documentation

## 2015-01-01 DIAGNOSIS — Z79899 Other long term (current) drug therapy: Secondary | ICD-10-CM | POA: Insufficient documentation

## 2015-01-01 DIAGNOSIS — R109 Unspecified abdominal pain: Secondary | ICD-10-CM | POA: Insufficient documentation

## 2015-01-01 DIAGNOSIS — Z8719 Personal history of other diseases of the digestive system: Secondary | ICD-10-CM | POA: Insufficient documentation

## 2015-01-01 DIAGNOSIS — R000 Tachycardia, unspecified: Secondary | ICD-10-CM | POA: Insufficient documentation

## 2015-01-01 DIAGNOSIS — Z720 Tobacco use: Secondary | ICD-10-CM | POA: Insufficient documentation

## 2015-01-01 LAB — CBC WITH DIFFERENTIAL/PLATELET
Basophils Absolute: 0 10*3/uL (ref 0.0–0.1)
Basophils Relative: 0 % (ref 0–1)
Eosinophils Absolute: 0.1 10*3/uL (ref 0.0–0.7)
Eosinophils Relative: 0 % (ref 0–5)
HCT: 42.6 % (ref 39.0–52.0)
Hemoglobin: 14.2 g/dL (ref 13.0–17.0)
Lymphocytes Relative: 8 % — ABNORMAL LOW (ref 12–46)
Lymphs Abs: 1.6 10*3/uL (ref 0.7–4.0)
MCH: 31.1 pg (ref 26.0–34.0)
MCHC: 33.3 g/dL (ref 30.0–36.0)
MCV: 93.4 fL (ref 78.0–100.0)
Monocytes Absolute: 1.5 10*3/uL — ABNORMAL HIGH (ref 0.1–1.0)
Monocytes Relative: 7 % (ref 3–12)
Neutro Abs: 17.8 10*3/uL — ABNORMAL HIGH (ref 1.7–7.7)
Neutrophils Relative %: 85 % — ABNORMAL HIGH (ref 43–77)
Platelets: 288 10*3/uL (ref 150–400)
RBC: 4.56 MIL/uL (ref 4.22–5.81)
RDW: 14 % (ref 11.5–15.5)
WBC: 20.9 10*3/uL — ABNORMAL HIGH (ref 4.0–10.5)

## 2015-01-01 LAB — BASIC METABOLIC PANEL
Anion gap: 7 (ref 5–15)
BUN: 12 mg/dL (ref 6–23)
CO2: 26 mmol/L (ref 19–32)
Calcium: 9.3 mg/dL (ref 8.4–10.5)
Chloride: 106 mmol/L (ref 96–112)
Creatinine, Ser: 1.09 mg/dL (ref 0.50–1.35)
GFR calc Af Amer: >90 mL/min (ref >90)
GFR calc non Af Amer: 90 mL/min — ABNORMAL LOW (ref >90)
Glucose, Bld: 96 mg/dL (ref 70–99)
Potassium: 3.9 mmol/L (ref 3.5–5.1)
Sodium: 139 mmol/L (ref 135–145)

## 2015-01-01 LAB — URINALYSIS, ROUTINE W REFLEX MICROSCOPIC
Bilirubin Urine: NEGATIVE
Glucose, UA: NEGATIVE mg/dL
Ketones, ur: NEGATIVE mg/dL
Leukocytes, UA: NEGATIVE
Nitrite: NEGATIVE
Protein, ur: NEGATIVE mg/dL
Specific Gravity, Urine: 1.015 (ref 1.005–1.030)
Urobilinogen, UA: 0.2 mg/dL (ref 0.0–1.0)
pH: 7 (ref 5.0–8.0)

## 2015-01-01 LAB — URINE MICROSCOPIC-ADD ON

## 2015-01-01 LAB — LACTIC ACID, PLASMA
Lactic Acid, Venous: 1.1 mmol/L (ref 0.5–2.0)
Lactic Acid, Venous: 0.6 mmol/L (ref 0.5–2.0)

## 2015-01-01 MED ORDER — OXYCODONE-ACETAMINOPHEN 5-325 MG PO TABS: 1.0000 | ORAL_TABLET | ORAL | Status: DC | PRN

## 2015-01-01 MED ORDER — KETOROLAC TROMETHAMINE 30 MG/ML IJ SOLN
15.0000 mg | Freq: Once | INTRAMUSCULAR | Status: AC
  Administered 2015-01-01: 15 mg via INTRAVENOUS
  Filled 2015-01-01: qty 1

## 2015-01-01 MED ORDER — HYDROMORPHONE HCL 1 MG/ML IJ SOLN
1.0000 mg | Freq: Once | INTRAMUSCULAR | Status: AC
  Administered 2015-01-01: 1 mg via INTRAVENOUS
  Filled 2015-01-01: qty 1

## 2015-01-01 MED ORDER — SULFAMETHOXAZOLE-TRIMETHOPRIM 800-160 MG PO TABS: 1.0000 | ORAL_TABLET | Freq: Two times a day (BID) | ORAL | Status: AC

## 2015-01-01 MED ORDER — SODIUM CHLORIDE 0.9 % IV BOLUS (SEPSIS)
1000.0000 mL | Freq: Once | INTRAVENOUS | Status: AC
  Administered 2015-01-01: 1000 mL via INTRAVENOUS

## 2015-01-01 MED ORDER — GADOBENATE DIMEGLUMINE 529 MG/ML IV SOLN
15.0000 mL | Freq: Once | INTRAVENOUS | Status: AC | PRN
  Administered 2015-01-01: 15 mL via INTRAVENOUS

## 2015-01-01 MED ORDER — HYDROMORPHONE HCL 1 MG/ML IJ SOLN
1.0000 mg | Freq: Once | INTRAMUSCULAR | Status: AC
Start: 2015-01-01 — End: 2015-01-01
  Administered 2015-01-01: 1 mg via INTRAVENOUS
  Filled 2015-01-01: qty 1

## 2015-01-01 MED ORDER — VANCOMYCIN HCL 10 G IV SOLR
1500.0000 mg | Freq: Once | INTRAVENOUS | Status: AC
  Administered 2015-01-01: 1500 mg via INTRAVENOUS
  Filled 2015-01-01: qty 1500

## 2015-01-01 NOTE — ED Notes (Signed)
Pt reports left lower back pain radiating to left leg x1 month. Pt reports was recently moving furniture and reports increased pain ever since.

## 2015-01-01 NOTE — ED Provider Notes (Signed)
CSN: [id number]     Arrival date & time 01/01/15  1247 History  This chart was scribed for Virgel Manifold, MD by Chester Holstein, ED Scribe. This patient was seen in room APA19/APA19 and the patient's care was started at 2:05 PM.     Chief Complaint  Patient presents with  . Back Pain    Patient is a 29 y.o. male presenting with back pain. The history is provided by the patient. No language interpreter was used.  Back Pain Associated symptoms: abdominal pain and numbness   Associated symptoms: no dysuria and no weakness    HPI Comments: Brian [name] is a 29 y.o. male with PMHx of renal disorder, peptic ulcer, and narcotic abuse who presents to the Emergency Department complaining of low back pain with first onset 2 months ago. Pt notes associated constant left leg numbness starting with buttock radiating down to toes with intermittent cramps and spasms, lower abdominal pain, anal bleeding, lesion to buttock, rectal pain, and difficulty urinating stating "stream comes out in spurts."  Pt notes any movement aggravates the pain.  Pt has been seen in several EDs for same recently, last seen on  12/27/14 and has been taking Tylenol and ibuprofen for relief as instructed. Pt denies h/o IVDA. Pt denies known injury, nausea, SOB, hematuria, dysuria, and hematochezia.  Past Medical History  Diagnosis Date  . Narcotic abuse   . Peptic ulcer   . Renal disorder     kidney stones   Past Surgical History  Procedure Laterality Date  . Hand surgery  2006  . Esophagogastroduodenoscopy N/A 05/29/2014    Procedure: ESOPHAGOGASTRODUODENOSCOPY (EGD) ;  Surgeon: Beryle Beams, MD;  Location: [facility] ENDOSCOPY;  Service: Endoscopy;  Laterality: N/A;  check with Dr. Collene Mares about sedation type/timinng - I recommend MAC   History reviewed. No pertinent family history. History  Substance Use Topics  . Smoking status: Current Every Day Smoker -- 0.50 packs/day    Types: Cigarettes  . Smokeless tobacco: Not on file   . Alcohol Use: 6.0 oz/week    10 Shots of liquor per week     Comment: occ    Review of Systems  Respiratory: Negative for shortness of breath.   Gastrointestinal: Positive for abdominal pain, anal bleeding and rectal pain. Negative for nausea and blood in stool.  Genitourinary: Positive for difficulty urinating. Negative for dysuria and hematuria.  Musculoskeletal: Positive for myalgias and back pain.  Neurological: Positive for numbness. Negative for weakness.      Allergies  Review of patient's allergies indicates no known allergies.  Home Medications   Prior to Admission medications   Medication Sig Start Date End Date Taking? Authorizing Provider  bismuth subsalicylate (PEPTO BISMOL) 262 MG chewable tablet Chew 524 mg by mouth every 4 (four) hours as needed for indigestion or diarrhea or loose stools.   Yes Historical Provider, MD  dexamethasone (DECADRON) 4 MG tablet Take 1 tablet (4 mg total) by mouth 2 (two) times daily with a meal. 12/26/14  Yes Lily Kocher, PA-C  ibuprofen (ADVIL,MOTRIN) 200 MG tablet Take 400 mg by mouth every 6 (six) hours as needed (pain).   Yes Historical Provider, MD  acetaminophen-codeine (TYLENOL #3) 300-30 MG per tablet Take 1-2 tablets by mouth every 6 (six) hours as needed. Patient not taking: Reported on 01/01/2015 12/26/14   Lily Kocher, PA-C  diazepam (VALIUM) 5 MG tablet 1 po tid with food Patient not taking: Reported on 01/01/2015 12/26/14   Lily Kocher, PA-C  dicyclomine (BENTYL) 20 MG tablet Take 1 tablet (20 mg total) by mouth 2 (two) times daily. Patient not taking: Reported on 12/26/2014 06/01/14   Alvina Chou, PA-C  HYDROcodone-acetaminophen (NORCO/VICODIN) 5-325 MG per tablet Take 1-2 tablets by mouth every 4 (four) hours as needed for moderate pain or severe pain. Patient not taking: Reported on 12/26/2014 11/02/14   Clayton Bibles, PA-C  methocarbamol (ROBAXIN) 750 MG tablet Take 1 tablet (750 mg total) by mouth every 6 (six) hours as needed  for muscle spasms (and pain). Patient not taking: Reported on 12/26/2014 11/02/14   Clayton Bibles, PA-C  oxyCODONE (OXY IR/ROXICODONE) 5 MG immediate release tablet Take 1 tablet (5 mg total) by mouth every 4 (four) hours as needed for moderate pain. Patient not taking: Reported on 12/26/2014 05/29/14   Debbe Odea, MD  pantoprazole (PROTONIX) 40 MG tablet Take 1 tablet (40 mg total) by mouth daily. Patient not taking: Reported on 12/26/2014 05/29/14   Debbe Odea, MD  promethazine (PHENERGAN) 25 MG tablet Take 1 tablet (25 mg total) by mouth every 6 (six) hours as needed for nausea or vomiting. Patient not taking: Reported on 12/26/2014 06/01/14   Alvina Chou, PA-C   BP 117/76 mmHg  Pulse 140  Temp(Src) 99.3 F (37.4 C) (Oral)  Resp 22  Ht 5\' 8"  (1.727 m)  Wt 160 lb (72.576 kg)  BMI 24.33 kg/m2  SpO2 100% Physical Exam  Constitutional: He is oriented to person, place, and time. He appears well-developed and well-nourished. No distress.  HENT:  Head: Normocephalic and atraumatic.  Eyes: Conjunctivae are normal. Right eye exhibits no discharge. Left eye exhibits no discharge.  Neck: Neck supple.  Cardiovascular: Regular rhythm and normal heart sounds.  Tachycardia present.  Exam reveals no gallop and no friction rub.   No murmur heard. Pulmonary/Chest: Effort normal and breath sounds normal. No respiratory distress.  Abdominal: Soft. He exhibits no distension. There is no tenderness.  Genitourinary:  Tender indurated lesion to medial left buttock consistent with developing abscess. Fluctuance. Minimal bloody drainage. Unable to express any purulence.  Musculoskeletal: He exhibits no edema.       Thoracic back: He exhibits tenderness (midline lower).       Lumbar back: He exhibits tenderness.  Neurological: He is alert and oriented to person, place, and time.  Skin: Skin is warm and dry.  Psychiatric: He has a normal mood and affect. His behavior is normal. Thought content normal.  Nursing  note and vitals reviewed.   ED Course  Procedures (including critical care time) DIAGNOSTIC STUDIES: Oxygen Saturation is 100% on room air, normal by my interpretation.    COORDINATION OF CARE: 2:14 PM Discussed treatment plan with patient at beside, the patient agrees with the plan and has no further questions at this time.   Labs Review Labs Reviewed  CBC WITH DIFFERENTIAL/PLATELET - Abnormal; Notable for the following:    WBC 20.9 (*)    Neutrophils Relative % 85 (*)    Neutro Abs 17.8 (*)    Lymphocytes Relative 8 (*)    Monocytes Absolute 1.5 (*)    All other components within normal limits  BASIC METABOLIC PANEL - Abnormal; Notable for the following:    GFR calc non Af Amer 90 (*)    All other components within normal limits  URINALYSIS, ROUTINE W REFLEX MICROSCOPIC - Abnormal; Notable for the following:    Hgb urine dipstick SMALL (*)    All other components within normal limits  URINE MICROSCOPIC-ADD  ON - Abnormal; Notable for the following:    Bacteria, UA FEW (*)    All other components within normal limits  CULTURE, BLOOD (ROUTINE X 2)  CULTURE, BLOOD (ROUTINE X 2)  URINE CULTURE  LACTIC ACID, PLASMA  LACTIC ACID, PLASMA    Imaging Review No results found.   Mr Lumbar Spine W Wo Contrast  01/01/2015   CLINICAL DATA:  Low back pain. Difficulty with urination, 3 months duration, worsening recently.  EXAM: MRI LUMBAR SPINE WITHOUT AND WITH CONTRAST  TECHNIQUE: Multiplanar and multiecho pulse sequences of the lumbar spine were obtained without and with intravenous contrast.  CONTRAST:  33mL MULTIHANCE GADOBENATE DIMEGLUMINE 529 MG/ML IV SOLN  COMPARISON:  CT 12/18/2014  FINDINGS: There is no abnormality at L3 for or above. The discs are normal. The canal and foramina are widely patent. The distal cord and conus are normal.  L4-5: Minimal bulging of the disc. Minimal facet hypertrophy. No stenosis.  L5-S1: Disc degeneration with broad-based disc herniation that contacts  the ventral aspect of the thecal sac. There is mild narrowing of the subarticular lateral recesses without definite compression of the S1 nerve roots. S1 nerve root irritation could occur. No L5 compression is seen.  IMPRESSION: Broad-based disc herniation at L5-S1. No definite neural compression. Disc material abuts the S1 nerve roots and nerve root irritation could occur, though definite compression is not established.   Electronically Signed   By: Nelson Chimes M.D.   On: 01/01/2015 16:17   Mr Pelvis W Wo Contrast  01/01/2015   CLINICAL DATA:  Subsequent encounter for three month history of difficulty urinating, worsening over the last 2 weeks. Open wound at perineum for 1 week.  EXAM: MRI PELVIS WITHOUT AND WITH CONTRAST  TECHNIQUE: Multiplanar multisequence MR imaging of the pelvis was performed both before and after administration of intravenous contrast.  CONTRAST:  77mL MULTIHANCE GADOBENATE DIMEGLUMINE 529 MG/ML IV SOLN  COMPARISON:  CT scan from 11/15/2014.  FINDINGS: There is no free fluid or lymphadenopathy in the anatomic pelvis. Urinary bladder is nondistended. Prostate gland is unremarkable as are the seminal vesicles. No abnormal marrow signal within the visualized bony anatomy.  In the right paramidline subcutaneous perineal tissues, there is a small area of edema/ inflammation. This would correspond to the clinically apparent "Wound" . Overall, the area of edema/ inflammation measures 3.5 x 1.5 x 5.8 cm. There is no drainable abscess within this area of edema/ inflammation although a very tiny 10 x 4 mm fluid pocket is evident. There is no evidence for a communication to the rectum/anus or urethra.  IMPRESSION: Small focus of edema/ inflammation in the right paramidline subcutaneous tissues of the perineal region. No communication to the rectum/anus or urethra is visible. Overall imaging features are most suggestive of a focal cellulitis, potentially with a very tiny central 4 x 10 mm abscess.    Electronically Signed   By: Misty Stanley M.D.   On: 01/01/2015 16:34    EKG Interpretation None      MDM   Final diagnoses:  Lower back pain  Cellulitis of right buttock    29 year old male with continued back pain. Markedly tachycardic. Per review of records patient showing a pattern which is somewhat concerning for medication seeking. Denies IV drug use. Tachycardia out of proportion to apparent discomfort. Obtain MRI to evaluate for possible spinal epidural abscess or osteomyelitis/discitis. MRI showing some disc protrusion. He is neurovascularly intact. No radiographic evidence of infectious process in his lower  back were around his cord. Buttock pain is new and I feel related to his back pain. Imaging was cellulitis with questionable small abscess. No appreciable collection on exam incision and drainage was deferred. Will start him on antibiotics at this point. Sitz baths. As needed pain medication. Return precautions were discussed. I feel stable for discharge at this time.    Virgel Manifold, MD 01/08/15 [phone]

## 2015-01-01 NOTE — ED Notes (Addendum)
Pt says he had blood in stool while waiting to go to tx room  Pulse 130

## 2015-01-01 NOTE — ED Notes (Signed)
MD at bedside. 

## 2015-01-02 LAB — URINE CULTURE: Colony Count: 4000

## 2015-01-06 LAB — CULTURE, BLOOD (ROUTINE X 2)
Culture: NO GROWTH
Culture: NO GROWTH

## 2015-03-08 ENCOUNTER — Other Ambulatory Visit: Payer: Self-pay

## 2015-03-08 ENCOUNTER — Encounter: Payer: Self-pay | Admitting: Emergency Medicine

## 2015-03-08 ENCOUNTER — Emergency Department
Admission: EM | Admit: 2015-03-08 | Discharge: 2015-03-08 | Disposition: A | Discharge status: Home / Self Care | Payer: Self-pay | Attending: Emergency Medicine | Admitting: Emergency Medicine

## 2015-03-08 DIAGNOSIS — G8929 Other chronic pain: Secondary | ICD-10-CM | POA: Insufficient documentation

## 2015-03-08 DIAGNOSIS — Z720 Tobacco use: Secondary | ICD-10-CM | POA: Insufficient documentation

## 2015-03-08 DIAGNOSIS — Z9119 Patient's noncompliance with other medical treatment and regimen: Secondary | ICD-10-CM | POA: Insufficient documentation

## 2015-03-08 DIAGNOSIS — Z79899 Other long term (current) drug therapy: Secondary | ICD-10-CM | POA: Insufficient documentation

## 2015-03-08 DIAGNOSIS — N23 Unspecified renal colic: Secondary | ICD-10-CM | POA: Insufficient documentation

## 2015-03-08 DIAGNOSIS — R109 Unspecified abdominal pain: Secondary | ICD-10-CM

## 2015-03-08 DIAGNOSIS — R319 Hematuria, unspecified: Secondary | ICD-10-CM | POA: Insufficient documentation

## 2015-03-08 DIAGNOSIS — F1120 Opioid dependence, uncomplicated: Secondary | ICD-10-CM | POA: Diagnosis present

## 2015-03-08 DIAGNOSIS — M5432 Sciatica, left side: Secondary | ICD-10-CM | POA: Insufficient documentation

## 2015-03-08 HISTORY — DX: Calculus of kidney: N20.0

## 2015-03-08 LAB — COMPREHENSIVE METABOLIC PANEL
ALT: 12 U/L — ABNORMAL LOW (ref 17–63)
AST: 21 U/L (ref 15–41)
Albumin: 4.5 g/dL (ref 3.5–5.0)
Alkaline Phosphatase: 61 U/L (ref 38–126)
Anion gap: 6 (ref 5–15)
BILIRUBIN TOTAL: 0.4 mg/dL (ref 0.3–1.2)
BUN: 10 mg/dL (ref 6–20)
CHLORIDE: 103 mmol/L (ref 101–111)
CO2: 31 mmol/L (ref 22–32)
Calcium: 9.5 mg/dL (ref 8.9–10.3)
Creatinine, Ser: 1.13 mg/dL (ref 0.61–1.24)
GFR calc Af Amer: >60 mL/min (ref >60)
GFR calc non Af Amer: >60 mL/min (ref >60)
Glucose, Bld: 98 mg/dL (ref 65–99)
Potassium: 4.1 mmol/L (ref 3.5–5.1)
Sodium: 140 mmol/L (ref 135–145)
Total Protein: 8.1 g/dL (ref 6.5–8.1)

## 2015-03-08 LAB — CBC WITH DIFFERENTIAL/PLATELET
BASOS PCT: 1 %
Basophils Absolute: 0.1 10*3/uL (ref 0–0.1)
EOS ABS: 1 10*3/uL — AB (ref 0–0.7)
EOS PCT: 13 %
HEMATOCRIT: 43.3 % (ref 40.0–52.0)
Hemoglobin: 14.2 g/dL (ref 13.0–18.0)
LYMPHS PCT: 20 %
Lymphs Abs: 1.5 10*3/uL (ref 1.0–3.6)
MCH: 30.3 pg (ref 26.0–34.0)
MCHC: 32.7 g/dL (ref 32.0–36.0)
MCV: 92.6 fL (ref 80.0–100.0)
MONO ABS: 0.8 10*3/uL (ref 0.2–1.0)
Monocytes Relative: 11 %
Neutro Abs: 4.2 10*3/uL (ref 1.4–6.5)
Neutrophils Relative %: 55 %
PLATELETS: 210 10*3/uL (ref 150–440)
RBC: 4.68 MIL/uL (ref 4.40–5.90)
RDW: 13.2 % (ref 11.5–14.5)
WBC: 7.5 10*3/uL (ref 3.8–10.6)

## 2015-03-08 LAB — URINALYSIS COMPLETE WITH MICROSCOPIC (ARMC ONLY)
Bilirubin Urine: NEGATIVE
Glucose, UA: NEGATIVE mg/dL
Ketones, ur: NEGATIVE mg/dL
LEUKOCYTES UA: NEGATIVE
NITRITE: NEGATIVE
PH: 8 (ref 5.0–8.0)
Protein, ur: NEGATIVE mg/dL
Specific Gravity, Urine: 1.004 — ABNORMAL LOW (ref 1.005–1.030)

## 2015-03-08 LAB — LIPASE, BLOOD: Lipase: 25 U/L (ref 22–51)

## 2015-03-08 LAB — TROPONIN I: Troponin I: <0.03 ng/mL (ref <0.031)

## 2015-03-08 MED ORDER — ACETAMINOPHEN 500 MG PO TABS
1000.0000 mg | ORAL_TABLET | Freq: Once | ORAL | Status: AC
  Administered 2015-03-08: 1000 mg via ORAL

## 2015-03-08 MED ORDER — KETOROLAC TROMETHAMINE 60 MG/2ML IM SOLN
60.0000 mg | Freq: Once | INTRAMUSCULAR | Status: AC
  Administered 2015-03-08: 60 mg via INTRAMUSCULAR

## 2015-03-08 MED ORDER — KETOROLAC TROMETHAMINE 60 MG/2ML IM SOLN
INTRAMUSCULAR | Status: AC
  Administered 2015-03-08: 60 mg via INTRAMUSCULAR
  Filled 2015-03-08: qty 2

## 2015-03-08 MED ORDER — ACETAMINOPHEN 325 MG PO TABS
ORAL_TABLET | ORAL | Status: AC
  Administered 2015-03-08: 1000 mg via ORAL
  Filled 2015-03-08: qty 2

## 2015-03-08 MED ORDER — ONDANSETRON HCL 4 MG PO TABS: 4.0000 mg | ORAL_TABLET | Freq: Three times a day (TID) | ORAL | Status: DC | PRN

## 2015-03-08 MED ORDER — TAMSULOSIN HCL 0.4 MG PO CAPS: 0.4000 mg | ORAL_CAPSULE | Freq: Every day | ORAL | Status: DC

## 2015-03-08 NOTE — ED Notes (Signed)
Pt reports abd pain and left side flank pain for two weeks, worse today.

## 2015-03-08 NOTE — ED Notes (Signed)
Pt reports abd pain and left side flank pain for two mths. Denies any n/v/d.

## 2015-03-08 NOTE — Discharge Instructions (Signed)
You have been seen in the Emergency Department (ED) today for pain that we believe based on your workup, is caused by kidney stones.  As we have discussed, please drink plenty of fluids.  Please make a follow up appointment with the physician(s) listed elsewhere in this documentation.  Please take your prescribed Flomax daily.  We also recommend that you take over-the-counter ibuprofen regularly (600 mg three times a day with meals) over the next 5 days.  Take it with meals to minimize stomach discomfort. You can also take up to 1000 mg of Tylenol four times per day.  Please see your doctor as soon as possible as stones may take 1-3 weeks to pass and you may require additional care or medications.  Return to the Emergency Department (ED) or call your doctor if you have any worsening pain, fever, painful urination, are unable to urinate, or develop other symptoms that concern you.

## 2015-03-08 NOTE — ED Provider Notes (Signed)
[facility] Emergency Department Provider Note  ____________________________________________  Time seen: Approximately 3:12 PM  I have reviewed the triage vital signs and the nursing notes.   HISTORY  Chief Complaint Abdominal Pain    HPI Brian [name] is a 29 y.o. male with a history of chronic abdominal pain, left-sided sciatica, kidney stones, and narcotics dependence/abuse who presents with what he describes as "at least several weeks" of abdominal pain and left-sided flank pain as well as pain that radiates down the back of his buttocks.  He has had these symptoms in the past.  He does not have a primary care doctor, but a review of his recent encounters shows that he does go to multiple different locations within the  care system.  He denies fever/chills, chest pain, shortness of breath.  He has noted hematuria.  He continues to urinate at a normal frequency.  He describes the pain as severe and worse with movement.   Past Medical History  Diagnosis Date  . Narcotic abuse   . Peptic ulcer   . Renal disorder     kidney stones  . Kidney stones     Patient Active Problem List   Diagnosis Date Noted  . Uncomplicated opioid dependence 03/08/2015  . Ileus of unspecified type 05/28/2014  . Abdominal pain 05/28/2014  . AKI (acute kidney injury) 05/28/2014  . Esophagitis, acute 05/28/2014  . Polysubstance abuse 05/28/2014  . Hematemesis 05/28/2014    Past Surgical History  Procedure Laterality Date  . Hand surgery  2006  . Esophagogastroduodenoscopy N/A 05/29/2014    Procedure: ESOPHAGOGASTRODUODENOSCOPY (EGD) ;  Surgeon: Beryle Beams, MD;  Location: [facility] ENDOSCOPY;  Service: Endoscopy;  Laterality: N/A;  check with Dr. Collene Mares about sedation type/timinng - I recommend MAC    Current Outpatient Rx  Name  Route  Sig  Dispense  Refill  . acetaminophen-codeine (TYLENOL #3) 300-30 MG per tablet   Oral   Take 1-2 tablets by mouth every 6  (six) hours as needed. Patient not taking: Reported on 01/01/2015   20 tablet   0   . dexamethasone (DECADRON) 4 MG tablet   Oral   Take 1 tablet (4 mg total) by mouth 2 (two) times daily with a meal.   12 tablet   0   . diazepam (VALIUM) 5 MG tablet      1 po tid with food Patient not taking: Reported on 01/01/2015   15 tablet   0   . dicyclomine (BENTYL) 20 MG tablet   Oral   Take 1 tablet (20 mg total) by mouth 2 (two) times daily. Patient not taking: Reported on 12/26/2014   20 tablet   0   . HYDROcodone-acetaminophen (NORCO/VICODIN) 5-325 MG per tablet   Oral   Take 1-2 tablets by mouth every 4 (four) hours as needed for moderate pain or severe pain. Patient not taking: Reported on 12/26/2014   15 tablet   0   . methocarbamol (ROBAXIN) 750 MG tablet   Oral   Take 1 tablet (750 mg total) by mouth every 6 (six) hours as needed for muscle spasms (and pain). Patient not taking: Reported on 12/26/2014   20 tablet   0   . oxyCODONE (OXY IR/ROXICODONE) 5 MG immediate release tablet   Oral   Take 1 tablet (5 mg total) by mouth every 4 (four) hours as needed for moderate pain. Patient not taking: Reported on 12/26/2014   30 tablet   0   .  oxyCODONE-acetaminophen (PERCOCET/ROXICET) 5-325 MG per tablet   Oral   Take 1-2 tablets by mouth every 4 (four) hours as needed for severe pain.   12 tablet   0   . pantoprazole (PROTONIX) 40 MG tablet   Oral   Take 1 tablet (40 mg total) by mouth daily. Patient not taking: Reported on 12/26/2014   30 tablet   3   . promethazine (PHENERGAN) 25 MG tablet   Oral   Take 1 tablet (25 mg total) by mouth every 6 (six) hours as needed for nausea or vomiting. Patient not taking: Reported on 12/26/2014   12 tablet   0     Allergies Review of patient's allergies indicates no known allergies.  No family history on file.  Social History History  Substance Use Topics  . Smoking status: Current Every Day Smoker -- 0.50 packs/day    Types:  Cigarettes  . Smokeless tobacco: Not on file  . Alcohol Use: 6.0 oz/week    10 Shots of liquor per week     Comment: occ    Review of Systems Constitutional: No fever/chills Eyes: No visual changes. ENT: No sore throat. Cardiovascular: Denies chest pain. Respiratory: Denies shortness of breath. Gastrointestinal:Abdominal pain and flank pain as described above..Vomiting "a few weeks ago"  Genitourinary:Gross hematuria. Musculoskeletal: Negative for back pain. Skin: Negative for rash. Neurological: Negative for headaches, focal weakness or numbness.  10-point ROS otherwise negative.  ____________________________________________   PHYSICAL EXAM:  VITAL SIGNS: ED Triage Vitals  Enc Vitals Group     BP 03/08/15 1044 113/82 mmHg     Pulse Rate 03/08/15 1044 91     Resp 03/08/15 1044 20     Temp 03/08/15 1044 98.3 F (36.8 C)     Temp Source 03/08/15 1044 Oral     SpO2 03/08/15 1044 100 %     Weight 03/08/15 1044 155 lb (70.308 kg)     Height 03/08/15 1044 5\' 8"  (1.727 m)     Head Cir --      Peak Flow --      Pain Score 03/08/15 1046 10     Pain Loc --      Pain Edu? --      Excl. in [place]? --     Constitutional: Alert and oriented. Appears uncomfortable but in no acute distress. Eyes: Conjunctivae are normal. PERRL. EOMI. Head: Atraumatic. Nose: No congestion/rhinnorhea. Mouth/Throat: Mucous membranes are moist.  Oropharynx non-erythematous. Neck: No stridor.   Cardiovascular: Normal rate, regular rhythm. Grossly normal heart sounds.  Good peripheral circulation. Respiratory: Normal respiratory effort.  No retractions. Lungs CTAB. Gastrointestinal: Softwith generalized tenderness. No distention. No abdominal bruits. Severe bilateral CVA tenderness. Musculoskeletal: No lower extremity tenderness nor edema.  No joint effusions. Neurologic:  Normal speech and language. No gross focal neurologic deficits are appreciated. Speech is normal. No gait instability. Skin:  Skin  is warm, dry and intact. No rash noted. Psychiatric: Mood and affect are normal. Speech and behavior are normal.  ____________________________________________   LABS (all labs ordered are listed, but only abnormal results are displayed)  Labs Reviewed  CBC WITH DIFFERENTIAL/PLATELET - Abnormal; Notable for the following:    Eosinophils Absolute 1.0 (*)    All other components within normal limits  COMPREHENSIVE METABOLIC PANEL - Abnormal; Notable for the following:    ALT 12 (*)    All other components within normal limits  URINALYSIS COMPLETEWITH MICROSCOPIC ([facility])  - Abnormal; Notable for the following:  Color, Urine YELLOW (*)    APPearance CLEAR (*)    Specific Gravity, Urine 1.004 (*)    Hgb urine dipstick 3+ (*)    Bacteria, UA RARE (*)    Squamous Epithelial / LPF 0-5 (*)    All other components within normal limits  LIPASE, BLOOD  TROPONIN I  Urine RBCs = TNTC ____________________________________________  EKG  ED ECG REPORT   Date: 03/08/2015  EKG Time: 11:01  Rate: 79  Rhythm: normal sinus rhythm  Axis: Normal  Intervals:none  ST&T Change: No ST or T-wave changes  ____________________________________________  RADIOLOGY  Deferred ____________________________________________   PROCEDURES  Procedure(s) performed: None  Critical Care performed: No  ____________________________________________   INITIAL IMPRESSION / ASSESSMENT AND PLAN / ED COURSE  Pertinent labs & imaging results that were available during my care of the patient were reviewed by me and considered in my medical decision making (see chart for details).  The patient has been evaluated extensively in the past with multiple CT scans.  He has also had hematuria in the past.  He does have hematuria today and has a history of kidney stones.  Fortunately he has a normal creatinine and no evidence of infection in his urinalysis.  I had an extensive discussion with him about renal colic  and the need to follow up with an urologist.  I do not believe he would benefit from imaging today.  I will treat with nonnarcotic pain medicine given the history of polysubstance abuse and narcotics addiction.  I gave the patient my usual and customary return precautions. ____________________________________________   FINAL CLINICAL IMPRESSION(S) / ED DIAGNOSES  Final diagnoses:  Renal colic on left side  Chronic abdominal pain  Chronic sciatica of left side  Hematuria     Hinda Kehr, MD 03/08/15 [phone]

## 2015-03-10 ENCOUNTER — Emergency Department (HOSPITAL_COMMUNITY)
Admission: EM | Admit: 2015-03-10 | Discharge: 2015-03-11 | Disposition: A | Discharge status: Home / Self Care | Payer: Self-pay | Attending: Emergency Medicine | Admitting: Emergency Medicine

## 2015-03-10 ENCOUNTER — Encounter (HOSPITAL_COMMUNITY): Payer: Self-pay | Admitting: Adult Health

## 2015-03-10 DIAGNOSIS — Z7952 Long term (current) use of systemic steroids: Secondary | ICD-10-CM | POA: Insufficient documentation

## 2015-03-10 DIAGNOSIS — Z720 Tobacco use: Secondary | ICD-10-CM | POA: Insufficient documentation

## 2015-03-10 DIAGNOSIS — R319 Hematuria, unspecified: Secondary | ICD-10-CM | POA: Insufficient documentation

## 2015-03-10 DIAGNOSIS — Z8719 Personal history of other diseases of the digestive system: Secondary | ICD-10-CM

## 2015-03-10 DIAGNOSIS — Z87442 Personal history of urinary calculi: Secondary | ICD-10-CM | POA: Insufficient documentation

## 2015-03-10 DIAGNOSIS — K625 Hemorrhage of anus and rectum: Secondary | ICD-10-CM | POA: Insufficient documentation

## 2015-03-10 DIAGNOSIS — Z87448 Personal history of other diseases of urinary system: Secondary | ICD-10-CM | POA: Insufficient documentation

## 2015-03-10 LAB — URINALYSIS, ROUTINE W REFLEX MICROSCOPIC
BILIRUBIN URINE: NEGATIVE
Glucose, UA: NEGATIVE mg/dL
Ketones, ur: NEGATIVE mg/dL
Leukocytes, UA: NEGATIVE
Nitrite: NEGATIVE
PH: 7 (ref 5.0–8.0)
Protein, ur: NEGATIVE mg/dL
SPECIFIC GRAVITY, URINE: 1.015 (ref 1.005–1.030)
UROBILINOGEN UA: 0.2 mg/dL (ref 0.0–1.0)

## 2015-03-10 LAB — URINE MICROSCOPIC-ADD ON

## 2015-03-10 LAB — POC OCCULT BLOOD, ED: Fecal Occult Bld: NEGATIVE

## 2015-03-10 NOTE — ED Notes (Signed)
Presents with blood in stool and a lump on rectum for "months" sometimes the blood is a lot and sometimes the blood is a little bit-hematuria began 2 weeks associated with bilateral flank pain and groin pain-he was seen at  [facility] yesterday and he was referred to urologist but did not have a CT scan-he is upset that he did not get a scan- reports he was given a script for flomax. But nothing for pain.

## 2015-03-10 NOTE — ED Provider Notes (Signed)
CSN: [id number]     Arrival date & time 03/10/15  2050 History   First MD Initiated Contact with Patient 03/10/15 2320     Chief Complaint  Patient presents with  . Flank Pain  . Abdominal Pain  . Hematuria  . Blood In Stools     (Consider location/radiation/quality/duration/timing/severity/associated sxs/prior Treatment) Patient is a 29 y.o. male presenting with flank pain, abdominal pain, and hematuria. The history is provided by the patient.  Flank Pain Associated symptoms include abdominal pain. Pertinent negatives include no chest pain, no headaches and no shortness of breath.  Abdominal Pain Associated symptoms: hematuria   Associated symptoms: no chest pain, no chills, no cough, no diarrhea, no fever, no shortness of breath, no sore throat and no vomiting   Hematuria Associated symptoms include abdominal pain. Pertinent negatives include no chest pain, no headaches and no shortness of breath.  Patient c/o blood in stool 'for months'. States small amounts, bright red. Denies melena. Denies rectal trauma. No hx hemorrhoids. No hard stools/constipation. States in past couple weeks, bilateral flank pain, right greater than left. pain moderate-severe, non radiating. Also w intermittent hematuria. Notes hx kidney stones. No scrotal or testicular pain or swelling. No anticoag use. No other abn bleeding or bruising. Was seen at [facility] yesterday, but c/o pain not better and no imaging done. No weight loss. Denies hx colonoscopy. No nv. Normal appetite. No fever or chills.      Past Medical History  Diagnosis Date  . Narcotic abuse   . Peptic ulcer   . Renal disorder     kidney stones  . Kidney stones    Past Surgical History  Procedure Laterality Date  . Hand surgery  2006  . Esophagogastroduodenoscopy N/A 05/29/2014    Procedure: ESOPHAGOGASTRODUODENOSCOPY (EGD) ;  Surgeon: Beryle Beams, MD;  Location: [facility] ENDOSCOPY;  Service: Endoscopy;  Laterality: N/A;  check with Dr. Collene Mares about  sedation type/timinng - I recommend MAC   History reviewed. No pertinent family history. History  Substance Use Topics  . Smoking status: Current Every Day Smoker -- 0.50 packs/day    Types: Cigarettes  . Smokeless tobacco: Not on file  . Alcohol Use: 6.0 oz/week    10 Shots of liquor per week     Comment: occ    Review of Systems  Constitutional: Negative for fever and chills.  HENT: Negative for sore throat.   Eyes: Negative for redness.  Respiratory: Negative for cough and shortness of breath.   Cardiovascular: Negative for chest pain.  Gastrointestinal: Positive for abdominal pain and anal bleeding. Negative for vomiting and diarrhea.  Endocrine: Negative for polyuria.  Genitourinary: Positive for hematuria and flank pain.  Musculoskeletal: Negative for back pain and neck pain.  Skin: Negative for rash.  Neurological: Negative for syncope and headaches.  Hematological: Does not bruise/bleed easily.  Psychiatric/Behavioral: Negative for confusion.      Allergies  Review of patient's allergies indicates no known allergies.  Home Medications   Prior to Admission medications   Medication Sig Start Date End Date Taking? Authorizing Provider  acetaminophen-codeine (TYLENOL #3) 300-30 MG per tablet Take 1-2 tablets by mouth every 6 (six) hours as needed. Patient not taking: Reported on 01/01/2015 12/26/14   Lily Kocher, PA-C  dexamethasone (DECADRON) 4 MG tablet Take 1 tablet (4 mg total) by mouth 2 (two) times daily with a meal. 12/26/14   Lily Kocher, PA-C  diazepam (VALIUM) 5 MG tablet 1 po tid with food Patient not taking:  Reported on 01/01/2015 12/26/14   Lily Kocher, PA-C  dicyclomine (BENTYL) 20 MG tablet Take 1 tablet (20 mg total) by mouth 2 (two) times daily. Patient not taking: Reported on 12/26/2014 06/01/14   Alvina Chou, PA-C  HYDROcodone-acetaminophen (NORCO/VICODIN) 5-325 MG per tablet Take 1-2 tablets by mouth every 4 (four) hours as needed for moderate pain  or severe pain. Patient not taking: Reported on 12/26/2014 11/02/14   Clayton Bibles, PA-C  methocarbamol (ROBAXIN) 750 MG tablet Take 1 tablet (750 mg total) by mouth every 6 (six) hours as needed for muscle spasms (and pain). Patient not taking: Reported on 12/26/2014 11/02/14   Clayton Bibles, PA-C  ondansetron (ZOFRAN) 4 MG tablet Take 1 tablet (4 mg total) by mouth every 8 (eight) hours as needed for nausea or vomiting. 03/08/15 03/07/16  Hinda Kehr, MD  oxyCODONE (OXY IR/ROXICODONE) 5 MG immediate release tablet Take 1 tablet (5 mg total) by mouth every 4 (four) hours as needed for moderate pain. Patient not taking: Reported on 12/26/2014 05/29/14   Debbe Odea, MD  oxyCODONE-acetaminophen (PERCOCET/ROXICET) 5-325 MG per tablet Take 1-2 tablets by mouth every 4 (four) hours as needed for severe pain. 01/01/15   Virgel Manifold, MD  pantoprazole (PROTONIX) 40 MG tablet Take 1 tablet (40 mg total) by mouth daily. Patient not taking: Reported on 12/26/2014 05/29/14   Debbe Odea, MD  promethazine (PHENERGAN) 25 MG tablet Take 1 tablet (25 mg total) by mouth every 6 (six) hours as needed for nausea or vomiting. Patient not taking: Reported on 12/26/2014 06/01/14   Alvina Chou, PA-C  tamsulosin (FLOMAX) 0.4 MG CAPS capsule Take 1 capsule (0.4 mg total) by mouth daily. 03/08/15   Hinda Kehr, MD   BP 138/90 mmHg  Pulse 94  Temp(Src) 98.1 F (36.7 C) (Oral)  Resp 20  Ht 5\' 8"  (1.727 m)  Wt 143 lb 8 oz (65.091 kg)  BMI 21.82 kg/m2  SpO2 99% Physical Exam  Constitutional: He is oriented to person, place, and time. He appears well-developed and well-nourished. No distress.  HENT:  Mouth/Throat: Oropharynx is clear and moist.  Eyes: Conjunctivae are normal. No scleral icterus.  Neck: Neck supple. No tracheal deviation present.  Cardiovascular: Normal rate, regular rhythm, normal heart sounds and intact distal pulses.   Pulmonary/Chest: Effort normal and breath sounds normal. No accessory muscle usage. No  respiratory distress.  Abdominal: Soft. Bowel sounds are normal. He exhibits no distension and no mass. There is no tenderness. There is no rebound and no guarding.  No hernia.   Genitourinary:  No cva tenderness. Normal external exam.  Nurse chaperone present for exam. Pt permits only extremely limited, brief rectal exam, w tip of finger barely entering rectum a cm (before pushing hand out/away and not wanting further exam). Stool is v light brown. No ext mass or ext hemorrhoid noted.   Musculoskeletal: Normal range of motion. He exhibits no edema.  Neurological: He is alert and oriented to person, place, and time.  Skin: Skin is warm and dry. No rash noted. He is not diaphoretic.  Psychiatric: He has a normal mood and affect.  Nursing note and vitals reviewed.   ED Course  Procedures (including critical care time) Labs Review  Results for orders placed or performed during the hospital encounter of 03/10/15  Urinalysis, Routine w reflex microscopic  Result Value Ref Range   Color, Urine YELLOW YELLOW   APPearance CLOUDY (A) CLEAR   Specific Gravity, Urine 1.015 1.005 - 1.030   pH  7.0 5.0 - 8.0   Glucose, UA NEGATIVE NEGATIVE mg/dL   Hgb urine dipstick MODERATE (A) NEGATIVE   Bilirubin Urine NEGATIVE NEGATIVE   Ketones, ur NEGATIVE NEGATIVE mg/dL   Protein, ur NEGATIVE NEGATIVE mg/dL   Urobilinogen, UA 0.2 0.0 - 1.0 mg/dL   Nitrite NEGATIVE NEGATIVE   Leukocytes, UA NEGATIVE NEGATIVE  Urine microscopic-add on  Result Value Ref Range   Squamous Epithelial / LPF RARE RARE   WBC, UA 0-2 <3 WBC/hpf   RBC / HPF 11-20 <3 RBC/hpf   Bacteria, UA RARE RARE  POC occult blood, ED Provider will collect  Result Value Ref Range   Fecal Occult Bld NEGATIVE NEGATIVE   Ct Abdomen Pelvis Wo Contrast  03/11/2015   CLINICAL DATA:  Chronic hematochezia and sensation of lump about the anorectal canal. Bilateral flank and groin pain. Initial encounter.  EXAM: CT ABDOMEN AND PELVIS WITHOUT CONTRAST   TECHNIQUE: Multidetector CT imaging of the abdomen and pelvis was performed following the standard protocol without IV contrast.  COMPARISON:  CT of the abdomen and pelvis from 03/04/2015  FINDINGS: The visualized lung bases are clear.  The liver and spleen are unremarkable in appearance. The gallbladder is within normal limits. The pancreas and adrenal glands are unremarkable.  A 3 mm stone is noted at the interpole region of the right kidney. The kidneys are otherwise unremarkable. There is no evidence of hydronephrosis. No obstructing ureteral stones are seen. No perinephric stranding is appreciated.  No free fluid is identified. The small bowel is unremarkable in appearance. The stomach is within normal limits. No acute vascular abnormalities are seen.  The appendix is normal in caliber, without evidence of appendicitis. The colon is unremarkable in appearance.  The bladder is mildly distended and grossly unremarkable. The prostate remains normal in size. No inguinal lymphadenopathy is seen.  The anorectal canal is unremarkable in appearance. No soft tissue inflammation is seen.  No acute osseous abnormalities are identified.  IMPRESSION: 1. No acute abnormality seen to explain the patient's symptoms. The anorectal canal is unremarkable in appearance. No soft tissue inflammation is seen. 2. 3 mm nonobstructing stone at the interpole region of the right kidney. Kidneys otherwise unremarkable in appearance.   Electronically Signed   By: Garald Balding M.D.   On: 03/11/2015 00:13       MDM   Ua.  Reviewed nursing notes and prior charts for additional history.   Reviewed labs from [facility] from yesterday.  From 2 days ago, cmet and cbc normal. ua w small amt blood, although also noted on prior uas.  Pt requests ct/imaging.  Ct neg acute.   Stool heme neg.  Given report recurrent rectal bleeding, will refer to gi for possible anoscopy/endoscopy.  pcp f/u re hematuria.   Given tylenol, pepcid,  and gi cocktail for symptom relief in ED.  No indication for narcotic rx.     Lajean Saver, MD 03/11/15 [phone]

## 2015-03-11 ENCOUNTER — Emergency Department (HOSPITAL_COMMUNITY): Payer: Self-pay

## 2015-03-11 MED ORDER — GI COCKTAIL ~~LOC~~
30.0000 mL | Freq: Once | ORAL | Status: AC
  Administered 2015-03-11: 30 mL via ORAL
  Filled 2015-03-11: qty 30

## 2015-03-11 MED ORDER — ACETAMINOPHEN 325 MG PO TABS
650.0000 mg | ORAL_TABLET | Freq: Once | ORAL | Status: AC
  Administered 2015-03-11: 650 mg via ORAL
  Filled 2015-03-11: qty 2

## 2015-03-11 MED ORDER — FAMOTIDINE 20 MG PO TABS
20.0000 mg | ORAL_TABLET | Freq: Once | ORAL | Status: AC
  Administered 2015-03-11: 20 mg via ORAL
  Filled 2015-03-11: qty 1

## 2015-03-11 NOTE — Discharge Instructions (Signed)
It was our pleasure to provide your ER care today - we hope that you feel better.  Rest. Drink adequate fluids.  Take tylenol as need.   May try pepcid and maalox as need.  Your ct scan was read as showing no acute process. Incidental note was made of a small 3 mm stone in the right kidney, but no obstructing stones or other cause of acute pain.  Your stool currently tests negative for blood.    For recent episodes rectal bleeding, follow up with GI doctor in the next 1-2 weeks - call office to arrange appointment.   For microscopic blood in urine, follow up with primary care doctor for recheck in the next couple weeks.   Return to ER if worse, new symptoms, fevers, other concern.     Rectal Bleeding Rectal bleeding is when blood passes out of the anus. It is usually a sign that something is wrong. It may not be serious, but it should always be evaluated. Rectal bleeding may present as bright red blood or extremely dark stools. The color may range from dark red or maroon to black (like tar). It is important that the cause of rectal bleeding be identified so treatment can be started and the problem corrected. CAUSES   Hemorrhoids. These are enlarged (dilated) blood vessels or veins in the anal or rectal area.  Fistulas. Theseare abnormal, burrowing channels that usually run from inside the rectum to the skin around the anus. They can bleed.  Anal fissures. This is a tear in the tissue of the anus. Bleeding occurs with bowel movements.  Diverticulosis. This is a condition in which pockets or sacs project from the bowel wall. Occasionally, the sacs can bleed.  Diverticulitis. Thisis an infection involving diverticulosis of the colon.  Proctitis and colitis. These are conditions in which the rectum, colon, or both, can become inflamed and pitted (ulcerated).  Polyps and cancer. Polyps are non-cancerous (benign) growths in the colon that may bleed. Certain types of polyps turn into  cancer.  Protrusion of the rectum. Part of the rectum can project from the anus and bleed.  Certain medicines.  Intestinal infections.  Blood vessel abnormalities. HOME CARE INSTRUCTIONS  Eat a high-fiber diet to keep your stool soft.  Limit activity.  Drink enough fluids to keep your urine clear or pale yellow.  Warm baths may be useful to soothe rectal pain.  Follow up with your caregiver as directed. SEEK IMMEDIATE MEDICAL CARE IF:  You develop increased bleeding.  You have black or dark red stools.  You vomit blood or material that looks like coffee grounds.  You have abdominal pain or tenderness.  You have a fever.  You feel weak, nauseous, or you faint.  You have severe rectal pain or you are unable to have a bowel movement. MAKE SURE YOU:  Understand these instructions.  Will watch your condition.  Will get help right away if you are not doing well or get worse. Document Released: 04/04/2002 Document Revised: 01/05/2012 Document Reviewed: 03/30/2011 [facility] Patient Information 2015 [place], [place]. This information is not intended to replace advice given to you by your health care provider. Make sure you discuss any questions you have with your health care provider.     Hematuria Hematuria is blood in your urine. It can be caused by a bladder infection, kidney infection, prostate infection, kidney stone, or cancer of your urinary tract. Infections can usually be treated with medicine, and a kidney stone usually will pass  through your urine. If neither of these is the cause of your hematuria, further workup to find out the reason may be needed. It is very important that you tell your health care provider about any blood you see in your urine, even if the blood stops without treatment or happens without causing pain. Blood in your urine that happens and then stops and then happens again can be a symptom of a very serious condition. Also, pain is not a symptom  in the initial stages of many urinary cancers. HOME CARE INSTRUCTIONS   Drink lots of fluid, 3-4 quarts a day. If you have been diagnosed with an infection, cranberry juice is especially recommended, in addition to large amounts of water.  Avoid caffeine, tea, and carbonated beverages because they tend to irritate the bladder.  Avoid alcohol because it may irritate the prostate.  Take all medicines as directed by your health care provider.  If you were prescribed an antibiotic medicine, finish it all even if you start to feel better.  If you have been diagnosed with a kidney stone, follow your health care provider's instructions regarding straining your urine to catch the stone.  Empty your bladder often. Avoid holding urine for long periods of time.  After a bowel movement, women should cleanse front to back. Use each tissue only once.  Empty your bladder before and after sexual intercourse if you are a male. SEEK MEDICAL CARE IF:  You develop back pain.  You have a fever.  You have a feeling of sickness in your stomach (nausea) or vomiting.  Your symptoms are not better in 3 days. Return sooner if you are getting worse. SEEK IMMEDIATE MEDICAL CARE IF:   You develop severe vomiting and are unable to keep the medicine down.  You develop severe back or abdominal pain despite taking your medicines.  You begin passing a large amount of blood or clots in your urine.  You feel extremely weak or faint, or you pass out. MAKE SURE YOU:   Understand these instructions.  Will watch your condition.  Will get help right away if you are not doing well or get worse. Document Released: 10/13/2005 Document Revised: 02/27/2014 Document Reviewed: 06/13/2013 [facility] Patient Information 2015 [place], [place]. This information is not intended to replace advice given to you by your health care provider. Make sure you discuss any questions you have with your health care provider.

## 2015-03-11 NOTE — ED Notes (Signed)
Patient returned from CT

## 2015-03-11 NOTE — ED Notes (Signed)
Vital signs stable. 

## 2015-03-24 ENCOUNTER — Emergency Department (HOSPITAL_COMMUNITY)
Admission: EM | Admit: 2015-03-24 | Discharge: 2015-03-25 | Disposition: A | Discharge status: Home / Self Care | Payer: Self-pay | Attending: Emergency Medicine | Admitting: Emergency Medicine

## 2015-03-24 ENCOUNTER — Encounter (HOSPITAL_COMMUNITY): Payer: Self-pay | Admitting: Emergency Medicine

## 2015-03-24 DIAGNOSIS — F1410 Cocaine abuse, uncomplicated: Secondary | ICD-10-CM | POA: Insufficient documentation

## 2015-03-24 DIAGNOSIS — Z87448 Personal history of other diseases of urinary system: Secondary | ICD-10-CM | POA: Insufficient documentation

## 2015-03-24 DIAGNOSIS — F1910 Other psychoactive substance abuse, uncomplicated: Secondary | ICD-10-CM

## 2015-03-24 DIAGNOSIS — Z720 Tobacco use: Secondary | ICD-10-CM | POA: Insufficient documentation

## 2015-03-24 DIAGNOSIS — F1110 Opioid abuse, uncomplicated: Secondary | ICD-10-CM | POA: Insufficient documentation

## 2015-03-24 DIAGNOSIS — Z87442 Personal history of urinary calculi: Secondary | ICD-10-CM | POA: Insufficient documentation

## 2015-03-24 DIAGNOSIS — Z8719 Personal history of other diseases of the digestive system: Secondary | ICD-10-CM | POA: Insufficient documentation

## 2015-03-24 DIAGNOSIS — F1310 Sedative, hypnotic or anxiolytic abuse, uncomplicated: Secondary | ICD-10-CM | POA: Insufficient documentation

## 2015-03-24 DIAGNOSIS — Z7952 Long term (current) use of systemic steroids: Secondary | ICD-10-CM | POA: Insufficient documentation

## 2015-03-24 DIAGNOSIS — F1210 Cannabis abuse, uncomplicated: Secondary | ICD-10-CM | POA: Insufficient documentation

## 2015-03-24 DIAGNOSIS — Z79899 Other long term (current) drug therapy: Secondary | ICD-10-CM | POA: Insufficient documentation

## 2015-03-24 HISTORY — DX: Alcohol abuse, uncomplicated: F10.10

## 2015-03-24 HISTORY — DX: Other psychoactive substance abuse, uncomplicated: F19.10

## 2015-03-24 HISTORY — DX: Cocaine abuse, uncomplicated: F14.10

## 2015-03-24 LAB — COMPREHENSIVE METABOLIC PANEL
ALT: 11 U/L — ABNORMAL LOW (ref 17–63)
ANION GAP: 7 (ref 5–15)
AST: 15 U/L (ref 15–41)
Albumin: 3.8 g/dL (ref 3.5–5.0)
Alkaline Phosphatase: 52 U/L (ref 38–126)
BILIRUBIN TOTAL: 0.3 mg/dL (ref 0.3–1.2)
BUN: 13 mg/dL (ref 6–20)
CALCIUM: 8.9 mg/dL (ref 8.9–10.3)
CO2: 26 mmol/L (ref 22–32)
CREATININE: 1.37 mg/dL — AB (ref 0.61–1.24)
Chloride: 106 mmol/L (ref 101–111)
GFR calc non Af Amer: >60 mL/min (ref >60)
GLUCOSE: 112 mg/dL — AB (ref 65–99)
Potassium: 3.7 mmol/L (ref 3.5–5.1)
SODIUM: 139 mmol/L (ref 135–145)
TOTAL PROTEIN: 6.8 g/dL (ref 6.5–8.1)

## 2015-03-24 LAB — RAPID URINE DRUG SCREEN, HOSP PERFORMED
AMPHETAMINES: NOT DETECTED
BENZODIAZEPINES: POSITIVE — AB
Barbiturates: NOT DETECTED
Cocaine: POSITIVE — AB
Opiates: POSITIVE — AB
TETRAHYDROCANNABINOL: POSITIVE — AB

## 2015-03-24 LAB — CBC WITH DIFFERENTIAL/PLATELET
BASOS ABS: 0 10*3/uL (ref 0.0–0.1)
BASOS PCT: 0 % (ref 0–1)
Eosinophils Absolute: 0.3 10*3/uL (ref 0.0–0.7)
Eosinophils Relative: 6 % — ABNORMAL HIGH (ref 0–5)
HEMATOCRIT: 37.4 % — AB (ref 39.0–52.0)
HEMOGLOBIN: 12.2 g/dL — AB (ref 13.0–17.0)
LYMPHS ABS: 1.4 10*3/uL (ref 0.7–4.0)
LYMPHS PCT: 23 % (ref 12–46)
MCH: 30 pg (ref 26.0–34.0)
MCHC: 32.6 g/dL (ref 30.0–36.0)
MCV: 91.9 fL (ref 78.0–100.0)
Monocytes Absolute: 0.3 10*3/uL (ref 0.1–1.0)
Monocytes Relative: 6 % (ref 3–12)
Neutro Abs: 3.7 10*3/uL (ref 1.7–7.7)
Neutrophils Relative %: 65 % (ref 43–77)
PLATELETS: 215 10*3/uL (ref 150–400)
RBC: 4.07 MIL/uL — ABNORMAL LOW (ref 4.22–5.81)
RDW: 13.1 % (ref 11.5–15.5)
WBC: 5.8 10*3/uL (ref 4.0–10.5)

## 2015-03-24 LAB — ETHANOL: Alcohol, Ethyl (B): 12 mg/dL — ABNORMAL HIGH (ref <5)

## 2015-03-24 MED ORDER — CARBAMAZEPINE 200 MG PO TABS: ORAL_TABLET | ORAL | Status: DC

## 2015-03-24 NOTE — ED Notes (Addendum)
Last drink was 1945  Drank 7 airplane sized bottles of fireball whiskey and a few beers

## 2015-03-24 NOTE — ED Notes (Signed)
Last took xanax today, marijuana today Opiates yesterday Crack, cocaine 2 days ago

## 2015-03-24 NOTE — ED Notes (Signed)
Pt stable, ambulatory, states understanding of discharge instructions 

## 2015-03-24 NOTE — ED Provider Notes (Signed)
CSN: [id number]     Arrival date & time 03/24/15  2009 History   First MD Initiated Contact with Patient 03/24/15 2115     Chief Complaint  Patient presents with  . Drug Problem     (Consider location/radiation/quality/duration/timing/severity/associated sxs/prior Treatment) HPI   29 year old male with history of polysubstance abuse including cocaine abuse who presents requesting for detox. He admits that he has been taking Xanax, and marijuana today. He to opiates yesterday and crack and cocaine 2 days ago. He also admits to drinking whiskey some beers throughout the day. He denies any suicidal homicidal ideation and no hallucination. Patient states he's just tired of abusing drugs and alcohol. He has been through detox once many years ago for opiate abuse. He lives in [place]. Aside from mild abdominal pain he denies having any chest pain, shortness of breath, back pain, headache, or rash. Patient is sleepy. He also states he is hungry requesting for food. Patient denies IV drug use  Past Medical History  Diagnosis Date  . Narcotic abuse   . Peptic ulcer   . Renal disorder     kidney stones  . Kidney stones   . Polysubstance abuse   . Cocaine abuse   . Alcohol abuse    Past Surgical History  Procedure Laterality Date  . Hand surgery  2006  . Esophagogastroduodenoscopy N/A 05/29/2014    Procedure: ESOPHAGOGASTRODUODENOSCOPY (EGD) ;  Surgeon: Beryle Beams, MD;  Location: [facility] ENDOSCOPY;  Service: Endoscopy;  Laterality: N/A;  check with Dr. Collene Mares about sedation type/timinng - I recommend MAC   No family history on file. History  Substance Use Topics  . Smoking status: Current Every Day Smoker -- 0.50 packs/day    Types: Cigarettes  . Smokeless tobacco: Not on file  . Alcohol Use: Yes    Review of Systems  All other systems reviewed and are negative.     Allergies  Review of patient's allergies indicates no known allergies.  Home Medications   Prior to Admission  medications   Medication Sig Start Date End Date Taking? Authorizing Provider  ibuprofen (ADVIL,MOTRIN) 200 MG tablet Take 400 mg by mouth every 6 (six) hours as needed.   Yes Historical Provider, MD  acetaminophen-codeine (TYLENOL #3) 300-30 MG per tablet Take 1-2 tablets by mouth every 6 (six) hours as needed. Patient not taking: Reported on 01/01/2015 12/26/14   Lily Kocher, PA-C  dexamethasone (DECADRON) 4 MG tablet Take 1 tablet (4 mg total) by mouth 2 (two) times daily with a meal. 12/26/14   Lily Kocher, PA-C  diazepam (VALIUM) 5 MG tablet 1 po tid with food Patient not taking: Reported on 01/01/2015 12/26/14   Lily Kocher, PA-C  dicyclomine (BENTYL) 20 MG tablet Take 1 tablet (20 mg total) by mouth 2 (two) times daily. Patient not taking: Reported on 12/26/2014 06/01/14   Alvina Chou, PA-C  HYDROcodone-acetaminophen (NORCO/VICODIN) 5-325 MG per tablet Take 1-2 tablets by mouth every 4 (four) hours as needed for moderate pain or severe pain. Patient not taking: Reported on 12/26/2014 11/02/14   Clayton Bibles, PA-C  methocarbamol (ROBAXIN) 750 MG tablet Take 1 tablet (750 mg total) by mouth every 6 (six) hours as needed for muscle spasms (and pain). Patient not taking: Reported on 12/26/2014 11/02/14   Clayton Bibles, PA-C  ondansetron (ZOFRAN) 4 MG tablet Take 1 tablet (4 mg total) by mouth every 8 (eight) hours as needed for nausea or vomiting. 03/08/15 03/07/16  Hinda Kehr, MD  oxyCODONE (OXY IR/ROXICODONE)  5 MG immediate release tablet Take 1 tablet (5 mg total) by mouth every 4 (four) hours as needed for moderate pain. Patient not taking: Reported on 12/26/2014 05/29/14   Debbe Odea, MD  oxyCODONE-acetaminophen (PERCOCET/ROXICET) 5-325 MG per tablet Take 1-2 tablets by mouth every 4 (four) hours as needed for severe pain. 01/01/15   Virgel Manifold, MD  pantoprazole (PROTONIX) 40 MG tablet Take 1 tablet (40 mg total) by mouth daily. Patient not taking: Reported on 12/26/2014 05/29/14   Debbe Odea, MD   promethazine (PHENERGAN) 25 MG tablet Take 1 tablet (25 mg total) by mouth every 6 (six) hours as needed for nausea or vomiting. Patient not taking: Reported on 12/26/2014 06/01/14   Alvina Chou, PA-C  tamsulosin (FLOMAX) 0.4 MG CAPS capsule Take 1 capsule (0.4 mg total) by mouth daily. 03/08/15   Hinda Kehr, MD   BP 103/57 mmHg  Pulse 78  Temp(Src) 98.2 F (36.8 C) (Oral)  Resp 16  Ht 5\' 8"  (1.727 m)  Wt 150 lb (68.04 kg)  BMI 22.81 kg/m2  SpO2 95% Physical Exam  Constitutional: He appears well-developed and well-nourished. No distress.  Somnolence but Easily arousable and answers questions appropriately.  HENT:  Head: Atraumatic.  Mouth/Throat: Oropharynx is clear and moist.  Eyes: Conjunctivae and EOM are normal. Pupils are equal, round, and reactive to light.  Neck: Neck supple.  Cardiovascular: Normal rate and regular rhythm.   Pulmonary/Chest: Effort normal and breath sounds normal.  Abdominal: Soft. There is no tenderness.  Neurological: He has normal strength. No cranial nerve deficit or sensory deficit. GCS eye subscore is 4. GCS verbal subscore is 5. GCS motor subscore is 6.  Skin: No rash noted.  Psychiatric: He has a normal mood and affect. His speech is delayed. He is slowed. Thought content is not paranoid. He expresses no homicidal and no suicidal ideation.  Nursing note and vitals reviewed.   ED Course  Procedures (including critical care time)  Patient with history of polysubstance abuse here requesting for substance detox. No SI/HI/hallucination. Workup initiated.  11:08 PM Pt has been medically screened.  Patient given resources for outpatient follow-up for detox. Tegretol given to help with his craving. Return precautions discussed.  Labs Review Labs Reviewed  CBC WITH DIFFERENTIAL/PLATELET - Abnormal; Notable for the following:    RBC 4.07 (*)    Hemoglobin 12.2 (*)    HCT 37.4 (*)    Eosinophils Relative 6 (*)    All other components within  normal limits  COMPREHENSIVE METABOLIC PANEL - Abnormal; Notable for the following:    Glucose, Bld 112 (*)    Creatinine, Ser 1.37 (*)    ALT 11 (*)    All other components within normal limits  ETHANOL - Abnormal; Notable for the following:    Alcohol, Ethyl (B) 12 (*)    All other components within normal limits  URINE RAPID DRUG SCREEN (HOSP PERFORMED) NOT AT [facility] - Abnormal; Notable for the following:    Opiates POSITIVE (*)    Cocaine POSITIVE (*)    Benzodiazepines POSITIVE (*)    Tetrahydrocannabinol POSITIVE (*)    All other components within normal limits    Imaging Review No results found.   EKG Interpretation None      MDM   Final diagnoses:  Polysubstance abuse    BP 93/51 mmHg  Pulse 63  Temp(Src) 98.2 F (36.8 C) (Oral)  Resp 16  Ht 5\' 8"  (1.727 m)  Wt 150 lb (68.04 kg)  BMI 22.81 kg/m2  SpO2 95%     Domenic Moras, PA-C 03/24/15 2309  Nat Christen, MD 03/24/15 [phone]

## 2015-03-24 NOTE — ED Notes (Signed)
Pt. requesting detox from opiates, marijuana and benzodiazepines last intake today , denies suicidal ideation / no hallucinations . Alert and oriented / respirations unlabored.

## 2015-03-24 NOTE — Discharge Instructions (Signed)
Please use resources below to follow closely with a detox center for further management of your condition.  Take tegretol as prescribed to help with craving.   Chemical Dependency Chemical dependency is an addiction to drugs or alcohol. It is characterized by the repeated behavior of seeking out and using drugs and alcohol despite harmful consequences to the health and safety of ones self and others.  RISK FACTORS There are certain situations or behaviors that increase a person's risk for chemical dependency. These include:  A family history of chemical dependency.  A history of mental health issues, including depression and anxiety.  A home environment where drugs and alcohol are easily available to you.  Drug or alcohol use at a young age. SYMPTOMS  The following symptoms can indicate chemical dependency:  Inability to limit the use of drugs or alcohol.  Nausea, sweating, shakiness, and anxiety that occurs when alcohol or drugs are not being used.  An increase in amount of drugs or alcohol that is necessary to get drunk or high. People who experience these symptoms can assess their use of drugs and alcohol by asking themselves the following questions:  Have you been told by friends or family that they are worried about your use of alcohol or drugs?  Do friends and family ever tell you about things you did while drinking alcohol or using drugs that you do not remember?  Do you lie about using alcohol or drugs or about the amounts you use?  Do you have difficulty completing daily tasks unless you use alcohol or drugs?  Is the level of your work or school performance lower because of your drug or alcohol use?  Do you get sick from using drugs or alcohol but keep using anyway?  Do you feel uncomfortable in social situations unless you use alcohol or drugs?  Do you use drugs or alcohol to help forget problems? An answer of yes to any of these questions may indicate chemical  dependency. Professional evaluation is suggested. Document Released: 10/07/2001 Document Revised: 01/05/2012 Document Reviewed: 12/19/2010 [facility] Patient Information 2015 [place], [place]. This information is not intended to replace advice given to you by your health care provider. Make sure you discuss any questions you have with your health care provider.   Emergency Department Resource Guide 1) Find a Doctor and Pay Out of Pocket Although you won't have to find out who is covered by your insurance plan, it is a good idea to ask around and get recommendations. You will then need to call the office and see if the doctor you have chosen will accept you as a new patient and what types of options they offer for patients who are self-pay. Some doctors offer discounts or will set up payment plans for their patients who do not have insurance, but you will need to ask so you aren't surprised when you get to your appointment.  2) Contact Your Local Health Department Not all health departments have doctors that can see patients for sick visits, but many do, so it is worth a call to see if yours does. If you don't know where your local health department is, you can check in your phone book. The CDC also has a tool to help you locate your state's health department, and many state websites also have listings of all of their local health departments.  3) Find a [facility] If your illness is not likely to be very severe or complicated, you may want to try a  walk in clinic. These are popping up all over the country in pharmacies, drugstores, and shopping centers. They're usually staffed by nurse practitioners or physician assistants that have been trained to treat common illnesses and complaints. They're usually fairly quick and inexpensive. However, if you have serious medical issues or chronic medical problems, these are probably not your best option.  No Primary Care Doctor: - Call Health Connect at   [phone] - they can help you locate a primary care doctor that  accepts your insurance, provides certain services, etc. - Physician Referral Service- [phone]  Chronic Pain Problems: Organization         Address  Phone   Notes  [facility]  [phone] Patients need to be referred by their primary care doctor.   Medication Assistance: Organization         Address  Phone   Notes  [facility] Medication [facility] [place] [place], [place] [address] [phone] --Must be a resident of [facility] -- Must have NO insurance coverage whatsoever (no Medicaid/ Medicare, etc.) -- The pt. MUST have a primary care doctor that directs their care regularly and follows them in the community   [facility]ist  [phone]   [employer]  [phone]    Agencies that provide inexpensive medical care: Organization         Address  Phone   Notes  [place]  [phone]   [facility]    [phone]   [facility] [place], [place] [address] [phone]   [place] [address], [place] [phone]   Planned Parenthood    [phone]   [facility]    [phone]   [place] [address], [place] Phone:  [phone], Fax:  [phone] Hours of Operation:  9 am - 6 pm, M-F.  Also accepts Medicaid/Medicare and self-pay.  [facility] for [place], [address], [place] Phone: [phone], Fax: [phone]. Hours of Operation:  8:30 am - 5:30 pm, M-F.  Also accepts Medicaid and self-pay.  [facility] [place] [address], [place] Phone: [phone]   [place], [place], [place] [phone], Ext. [phone] Mondays & Thursdays: 7-9 AM.  First 15 patients are seen on a first come, first serve basis.     [place] Providers:  Organization         Address  Phone   Notes  [facility] [address], [place] [phone] Also accepts self-pay patients.  [facility] [id number] [place], [place]  [phone]   [place], [address], [place] [phone]   [facility] [address] [place] [phone]   [name] [address], [place]   [phone] Only accepts [place] Access [place] patients after they have their name applied to their card.   Self-Pay (no insurance) in [facility]:  Organization         Address  Phone   Notes  Sickle Cell Patients, [facility] [place] [phone]   [facility] [place] [phone]   Gershon Mussel  [facility] [place]  [address], [address], [place] [phone]   [facility]/Dr. Osei-Bonsu  [address] [place] or [address], [place] [phone] Phone number for both [place] and [place] locations is the same.  Urgent Medical and [facility] [address], [place] [phone]   [facility] [address], [place] or [address] [phone]   [facility] [address] [place] [phone], phone; [phone], fax Sees patients 1st and 3rd Saturday of every month.  Must not qualify for public or private insurance (i.e. Medicaid, Medicare, [place] Health Choice, Veterans' Benefits)  Household income should be no more than 200% of the poverty level The clinic cannot treat you if you are pregnant or think you are pregnant  Sexually transmitted diseases are not treated at the clinic.    Dental Care: Organization         Address  Phone  Notes  [facility]  Department of [facility] [place] [phone] Accepts children up to age 26 who are enrolled in [place] or [place]; pregnant women with a Medicaid card; and children who have applied for Medicaid or [place] Health Choice, but were declined, whose parents can pay a reduced fee at time of service.  [facility]  [address], [place] [phone] Accepts children up to age 58 who are enrolled in [place] or [place]; pregnant women with a Medicaid card; and children who have applied for Medicaid or [place] Health Choice, but were declined, whose parents can pay a reduced fee at time of service.  [facility] Adult [facility] Access PROGRAM  [place] [phone] Patients are seen by appointment only. Walk-ins are not accepted. [place] will see patients 30 years of age and older. Monday - Tuesday (8am-5pm) Most Wednesdays (8:30-5pm) $30 per visit, cash only  [facility] Adult Dental Access PROGRAM  [address], [facility] [phone] Patients are seen by appointment only. Walk-ins are not accepted. [place] will see patients 30 years of age and older. One Wednesday Evening (Monthly: Volunteer Based).  $30 per visit, cash only  [place]  [phone] for adults; Children under age 27, call Graduate Pediatric Dentistry at [phone]. Children aged 64-14, please call [phone] to request a pediatric application.  Dental services are provided in all areas of dental care including fillings, crowns and bridges, complete and partial dentures, implants, gum treatment, root canals, and extractions. Preventive care is also provided. Treatment is provided to both adults and children. Patients are selected via a lottery and there is often a waiting list.   [facility] [address] [place]  [phone]  [email or web address]   [facility] [address] [place], [place] [phone], Ext. [phone] Second and Fourth Thursday of each month, opens at 6:30 AM; Clinic ends at 9 AM.  Patients are seen on a first-come first-served basis, and a limited number are seen during each clinic.   [facility]  [address] Hillard Danker [place], [place] [phone]   Eligibility Requirements You must have lived in [place], [place], or [place] counties for at least the last three months.   You cannot be eligible for state or federal sponsored healthcare  insurance, including [employer], [place], or [employer].   You generally cannot be eligible for healthcare insurance through your employer.    How to apply: Eligibility screenings are held every Tuesday and Wednesday afternoon from 1:00 pm until 4:00 pm. You do not need an appointment for the interview!  [facility] [address] [place], [place]   [place]  [place] Department  [place]  [phone]    Behavioral Health Resources in the Community: Intensive Outpatient Programs Organization         Address  Phone  Notes  [facility] [place]. [address] [place], [place] [phone]   [facility] [address] [place], [place]   ADS: Alcohol & Drug Svcs [address], [place], [place]   [place] [address],  [place], [place] or [phone]   Substance Abuse Resources Organization         Address  Phone  Notes  Alcohol and Drug Services  [phone]   [place]  [phone]   [facility]   Chinita Pester  [phone]   Residential & Outpatient Substance Abuse Program  [phone]   Psychological Services Organization          Address  Phone  Notes  [facility] [place]  [place]  [phone]   [place] [address], [place] or [phone]    Mobile Crisis Teams Organization         Address  Phone  Notes  [facility], Mobile Crisis Care Unit  [phone]   [facility]  [address] [place], [place]   [place] [address] [place] [phone]    Self-Help/Support Groups Organization         Address  Phone             Notes  [place]. of [place] - variety of support groups  [place] Call for more information  Narcotics Anonymous (NA), [facility] [address], [employer] [place]  2 meetings at this location   Special educational needs teacher         Address  Phone  Notes  ASAP Residential Treatment [place],    [place]  [phone]   [facility]  [address] [place] [id number], [place], [place]   [place], [place] [phone] Admissions: 8am-3pm M-F  Incentives Substance [place] [phone],    [place], [place] [phone]   [facility] [address] [place], [place], [place]   [facility] [address]  [place], [place]   [facility] Programs - Intensive Outpatient [address] Kristeen Mans [address], [place], [place]   [facility] ([place].) [address].,  [place], [place] [phone] or [phone]   [facility] Treatment Services (RTS) [address] [place], [place] Accepts Medicaid  [facility] [place] [address]  [place] [phone] Substance Abuse/Addiction Treatment   [facility] Organization         Address  Phone  Notes  [facility]  [phone]   Domenic Schwab, PhD [address] [place] [address] [place], [place]   [phone] or (  [place]) [phone]   [facility]   [address] [place], [place] [phone]   [address], [place], [place] [phone] Insurance/Medicaid/sponsorship through [facility] and Families [address] [place], [place] [phone] [place] [address] [place] [address]   [place], [place] [phone]    Dr. Adele Schilder  [phone]   [facility] Dept. 1) [address], [place] 2) [place] 3)  [address], [place] [phone]  [phone]   [place] [phone] or [phone] (After Hours)

## 2015-03-25 ENCOUNTER — Emergency Department (HOSPITAL_COMMUNITY)
Admission: EM | Admit: 2015-03-25 | Discharge: 2015-03-25 | Disposition: A | Discharge status: Home / Self Care | Payer: Self-pay | Attending: Emergency Medicine | Admitting: Emergency Medicine

## 2015-03-25 ENCOUNTER — Encounter (HOSPITAL_COMMUNITY): Payer: Self-pay

## 2015-03-25 ENCOUNTER — Encounter (HOSPITAL_COMMUNITY): Payer: Self-pay | Admitting: *Deleted

## 2015-03-25 DIAGNOSIS — Z720 Tobacco use: Secondary | ICD-10-CM | POA: Insufficient documentation

## 2015-03-25 DIAGNOSIS — Z87442 Personal history of urinary calculi: Secondary | ICD-10-CM | POA: Insufficient documentation

## 2015-03-25 DIAGNOSIS — Z87448 Personal history of other diseases of urinary system: Secondary | ICD-10-CM | POA: Insufficient documentation

## 2015-03-25 DIAGNOSIS — Z8711 Personal history of peptic ulcer disease: Secondary | ICD-10-CM | POA: Insufficient documentation

## 2015-03-25 DIAGNOSIS — F1110 Opioid abuse, uncomplicated: Secondary | ICD-10-CM | POA: Insufficient documentation

## 2015-03-25 DIAGNOSIS — F1910 Other psychoactive substance abuse, uncomplicated: Secondary | ICD-10-CM

## 2015-03-25 DIAGNOSIS — F1210 Cannabis abuse, uncomplicated: Secondary | ICD-10-CM | POA: Insufficient documentation

## 2015-03-25 DIAGNOSIS — F1410 Cocaine abuse, uncomplicated: Secondary | ICD-10-CM | POA: Insufficient documentation

## 2015-03-25 LAB — COMPREHENSIVE METABOLIC PANEL
ALT: 12 U/L — AB (ref 17–63)
ANION GAP: 6 (ref 5–15)
AST: 17 U/L (ref 15–41)
Albumin: 4.3 g/dL (ref 3.5–5.0)
Alkaline Phosphatase: 57 U/L (ref 38–126)
BUN: 15 mg/dL (ref 6–20)
CO2: 29 mmol/L (ref 22–32)
Calcium: 9.2 mg/dL (ref 8.9–10.3)
Chloride: 104 mmol/L (ref 101–111)
Creatinine, Ser: 1.1 mg/dL (ref 0.61–1.24)
GFR calc Af Amer: >60 mL/min (ref >60)
GFR calc non Af Amer: >60 mL/min (ref >60)
Glucose, Bld: 101 mg/dL — ABNORMAL HIGH (ref 65–99)
POTASSIUM: 4.1 mmol/L (ref 3.5–5.1)
Sodium: 139 mmol/L (ref 135–145)
Total Bilirubin: 0.4 mg/dL (ref 0.3–1.2)
Total Protein: 7.6 g/dL (ref 6.5–8.1)

## 2015-03-25 LAB — ETHANOL: Alcohol, Ethyl (B): <5 mg/dL (ref <5)

## 2015-03-25 LAB — CBC
HCT: 42 % (ref 39.0–52.0)
Hemoglobin: 13.7 g/dL (ref 13.0–17.0)
MCH: 30.1 pg (ref 26.0–34.0)
MCHC: 32.6 g/dL (ref 30.0–36.0)
MCV: 92.3 fL (ref 78.0–100.0)
Platelets: 221 10*3/uL (ref 150–400)
RBC: 4.55 MIL/uL (ref 4.22–5.81)
RDW: 13.1 % (ref 11.5–15.5)
WBC: 5.9 10*3/uL (ref 4.0–10.5)

## 2015-03-25 LAB — ACETAMINOPHEN LEVEL

## 2015-03-25 LAB — SALICYLATE LEVEL: Salicylate Lvl: <4 mg/dL (ref 2.8–30.0)

## 2015-03-25 MED ORDER — CHLORDIAZEPOXIDE HCL 25 MG PO CAPS: ORAL_CAPSULE | ORAL | Status: DC

## 2015-03-25 MED ORDER — LORAZEPAM 1 MG PO TABS
2.0000 mg | ORAL_TABLET | Freq: Once | ORAL | Status: AC
  Administered 2015-03-25: 2 mg via ORAL
  Filled 2015-03-25: qty 2

## 2015-03-25 NOTE — ED Notes (Signed)
CIWA score-19 called to [place], [place].

## 2015-03-25 NOTE — ED Notes (Signed)
Last etoh was last nite before he came in here probably before 8pm.  Pt states he drinks a pint or more a day.  Pt states he feels shakey

## 2015-03-25 NOTE — ED Notes (Signed)
Patient states he has been doing multiple drugs-heroin, morphine, crack, opiates, marijuana, and whiskey. Patient states that he has been clean x 2 years prior to this binge. Patient denies SI/HI, visual or auditory hallucinations.

## 2015-03-25 NOTE — ED Notes (Signed)
Pt given urinal for urine specimen. 

## 2015-03-25 NOTE — ED Provider Notes (Signed)
CSN: [id number]     Arrival date & time 03/25/15  1259 History   First MD Initiated Contact with Patient 03/25/15 1401     Chief Complaint  Patient presents with  . Alcohol Problem  . polysubstance abuse      (Consider location/radiation/quality/duration/timing/severity/associated sxs/prior Treatment) HPI   Brian [name] is a 29 y.o. male who is here for evaluation of polysubstance abuse, including heroin and crack, as well as heavy drinking, and use of illegal pills. Is evaluated for the same. Last night at [place], and since then has continued this behavior, and sleeping in his [name]. He states that he was referred to [facility]; but that they need a "hospital referral". He denies suicidal or homicidal ideation. He has psychosocial stressors. There are no other known modifying factors.   Past Medical History  Diagnosis Date  . Narcotic abuse   . Peptic ulcer   . Renal disorder     kidney stones  . Kidney stones   . Polysubstance abuse   . Cocaine abuse   . Alcohol abuse    Past Surgical History  Procedure Laterality Date  . Hand surgery  2006  . Esophagogastroduodenoscopy N/A 05/29/2014    Procedure: ESOPHAGOGASTRODUODENOSCOPY (EGD) ;  Surgeon: Beryle Beams, MD;  [facility];  Service: Endoscopy;  Laterality: N/A;  check with Dr. Collene Mares about sedation type/timinng - I recommend MAC   History reviewed. No pertinent family history. History  Substance Use Topics  . Smoking status: Current Every Day Smoker -- 0.50 packs/day    Types: Cigarettes  . Smokeless tobacco: Not on file  . Alcohol Use: Yes     Comment: daily    Review of Systems  All other systems reviewed and are negative.     Allergies  Review of patient's allergies indicates no known allergies.  Home Medications   Prior to Admission medications   Medication Sig Start Date End Date Taking? Authorizing Provider  carbamazepine (TEGRETOL) 200 MG tablet 800mg  PO QD X 1D, then 600mg  PO QD X 1D,  then 400mg  QD X 1D, then 200mg  PO QD X 2D 03/24/15  Yes Domenic Moras, PA-C  ibuprofen (ADVIL,MOTRIN) 200 MG tablet Take 400 mg by mouth every 6 (six) hours as needed for mild pain.    Yes Historical Provider, MD  acetaminophen-codeine (TYLENOL #3) 300-30 MG per tablet Take 1-2 tablets by mouth every 6 (six) hours as needed. Patient not taking: Reported on 01/01/2015 12/26/14   Lily Kocher, PA-C  chlordiazePOXIDE (LIBRIUM) 25 MG capsule 50mg  PO TID x 1D, then 25-50mg  PO BID X 1D, then 25-50mg  PO QD X 1D Patient not taking: Reported on 03/25/2015 03/25/15   Tanna Furry, MD  dexamethasone (DECADRON) 4 MG tablet Take 1 tablet (4 mg total) by mouth 2 (two) times daily with a meal. Patient not taking: Reported on 03/25/2015 12/26/14   Lily Kocher, PA-C  diazepam (VALIUM) 5 MG tablet 1 po tid with food Patient not taking: Reported on 01/01/2015 12/26/14   Lily Kocher, PA-C  dicyclomine (BENTYL) 20 MG tablet Take 1 tablet (20 mg total) by mouth 2 (two) times daily. Patient not taking: Reported on 12/26/2014 06/01/14   Alvina Chou, PA-C  HYDROcodone-acetaminophen (NORCO/VICODIN) 5-325 MG per tablet Take 1-2 tablets by mouth every 4 (four) hours as needed for moderate pain or severe pain. Patient not taking: Reported on 12/26/2014 11/02/14   Clayton Bibles, PA-C  methocarbamol (ROBAXIN) 750 MG tablet Take 1 tablet (750 mg total) by mouth  every 6 (six) hours as needed for muscle spasms (and pain). Patient not taking: Reported on 12/26/2014 11/02/14   Clayton Bibles, PA-C  ondansetron (ZOFRAN) 4 MG tablet Take 1 tablet (4 mg total) by mouth every 8 (eight) hours as needed for nausea or vomiting. Patient not taking: Reported on 03/25/2015 03/08/15 03/07/16  Hinda Kehr, MD  oxyCODONE (OXY IR/ROXICODONE) 5 MG immediate release tablet Take 1 tablet (5 mg total) by mouth every 4 (four) hours as needed for moderate pain. Patient not taking: Reported on 12/26/2014 05/29/14   Debbe Odea, MD  pantoprazole (PROTONIX) 40 MG tablet Take 1 tablet  (40 mg total) by mouth daily. Patient not taking: Reported on 12/26/2014 05/29/14   Debbe Odea, MD  promethazine (PHENERGAN) 25 MG tablet Take 1 tablet (25 mg total) by mouth every 6 (six) hours as needed for nausea or vomiting. Patient not taking: Reported on 12/26/2014 06/01/14   Alvina Chou, PA-C  tamsulosin (FLOMAX) 0.4 MG CAPS capsule Take 1 capsule (0.4 mg total) by mouth daily. Patient not taking: Reported on 03/25/2015 03/08/15   Hinda Kehr, MD   BP 106/70 mmHg  Pulse 76  Temp(Src) 97.9 F (36.6 C) (Oral)  Resp 16  Ht 5\' 8"  (1.727 m)  Wt 150 lb (68.04 kg)  BMI 22.81 kg/m2  SpO2 96% Physical Exam  Constitutional: He is oriented to person, place, and time. He appears well-developed and well-nourished.  HENT:  Head: Normocephalic and atraumatic.  Right Ear: External ear normal.  Left Ear: External ear normal.  Eyes: Conjunctivae and EOM are normal. Pupils are equal, round, and reactive to light.  Neck: Normal range of motion and phonation normal. Neck supple.  Cardiovascular: Normal rate, regular rhythm and normal heart sounds.   Pulmonary/Chest: Effort normal and breath sounds normal. He exhibits no bony tenderness.  Abdominal: Soft. There is no tenderness.  Musculoskeletal: Normal range of motion.  Neurological: He is alert and oriented to person, place, and time. No cranial nerve deficit or sensory deficit. He exhibits normal muscle tone. Coordination normal.  Skin: Skin is warm, dry and intact.  Psychiatric: He has a normal mood and affect. His behavior is normal. Judgment and thought content normal.  He is calm and cooperative  Nursing note and vitals reviewed.   ED Course  Procedures (including critical care time)  Findings discussed with patient, all questions answered.  I had TTS, discussed things with him, to reinforce the treatment plan which was given yesterday, and is unchanged, today.  Labs Review Labs Reviewed  CBC  ACETAMINOPHEN LEVEL  COMPREHENSIVE  METABOLIC PANEL  ETHANOL  SALICYLATE LEVEL  URINE RAPID DRUG SCREEN (HOSP PERFORMED) NOT AT [facility]    Imaging Review No results found.   EKG Interpretation None      MDM   Final diagnoses:  Polysubstance abuse    Polysubstance abuse, without need for inpatient detoxification, or inpatient psychiatric treatment.  Nursing Notes Reviewed/ Care Coordinated Applicable Imaging Reviewed Interpretation of Laboratory Data incorporated into ED treatment  The patient appears reasonably screened and/or stabilized for discharge and I doubt any other medical condition or other [facility] requiring further screening, evaluation, or treatment in the ED at this time prior to discharge.  Plan: Home Medications- none; Home Treatments- stop substance abuse; return here if the recommended treatment, does not improve the symptoms; Recommended follow up- Treatment facility of choice     Daleen Bo, MD 03/25/15 [phone]

## 2015-03-25 NOTE — Discharge Instructions (Signed)
Polysubstance Abuse °When people abuse more than one drug or type of drug it is called polysubstance or polydrug abuse. For example, many smokers also drink alcohol. This is one form of polydrug abuse. Polydrug abuse also refers to the use of a drug to counteract an unpleasant effect produced by another drug. It may also be used to help with withdrawal from another drug. People who take stimulants may become agitated. Sometimes this agitation is countered with a tranquilizer. This helps protect against the unpleasant side effects. Polydrug abuse also refers to the use of different drugs at the same time.  °Anytime drug use is interfering with normal living activities, it has become abuse. This includes problems with family and friends. Psychological dependence has developed when your mind tells you that the drug is needed. This is usually followed by physical dependence which has developed when continuing increases of drug are required to get the same feeling or "high". This is known as addiction or chemical dependency. A person's risk is much higher if there is a history of chemical dependency in the family. °SIGNS OF CHEMICAL DEPENDENCY °· You have been told by friends or family that drugs have become a problem. °· You fight when using drugs. °· You are having blackouts (not remembering what you do while using). °· You feel sick from using drugs but continue using. °· You lie about use or amounts of drugs (chemicals) used. °· You need chemicals to get you going. °· You are suffering in work performance or in school because of drug use. °· You get sick from use of drugs but continue to use anyway. °· You need drugs to relate to people or feel comfortable in social situations. °· You use drugs to forget problems. °"Yes" answered to any of the above signs of chemical dependency indicates there are problems. The longer the use of drugs continues, the greater the problems will become. °If there is a family history of  drug or alcohol use, it is best not to experiment with these drugs. Continual use leads to tolerance. After tolerance develops more of the drug is needed to get the same feeling. This is followed by addiction. With addiction, drugs become the most important part of life. It becomes more important to take drugs than participate in the other usual activities of life. This includes relating to friends and family. Addiction is followed by dependency. Dependency is a condition where drugs are now needed not just to get high, but to feel normal. °Addiction cannot be cured but it can be stopped. This often requires outside help and the care of professionals. Treatment centers are listed in the yellow pages under: Cocaine, Narcotics, and Alcoholics Anonymous. Most hospitals and clinics can refer you to a specialized care center. Talk to your caregiver if you need help. °Document Released: 06/04/2005 Document Revised: 01/05/2012 Document Reviewed: 10/13/2005 °[employer]® Patient Information ©2015 [employer]. This information is not intended to replace advice given to you by your health care provider. Make sure you discuss any questions you have with your health care provider. ° °

## 2015-03-25 NOTE — ED Notes (Signed)
Patient states she wants to be detox. Form alcohol and drugs. States he drinks approx. 1 5th per day  States he shoots up freg.

## 2015-03-25 NOTE — Discharge Instructions (Signed)
Avoid illegal substances. Follow-up at the treatment center of your choice, as needed for assistance with avoiding use of drugs.    Polysubstance Abuse When people abuse more than one drug or type of drug it is called polysubstance or polydrug abuse. For example, many smokers also drink alcohol. This is one form of polydrug abuse. Polydrug abuse also refers to the use of a drug to counteract an unpleasant effect produced by another drug. It may also be used to help with withdrawal from another drug. People who take stimulants may become agitated. Sometimes this agitation is countered with a tranquilizer. This helps protect against the unpleasant side effects. Polydrug abuse also refers to the use of different drugs at the same time.  Anytime drug use is interfering with normal living activities, it has become abuse. This includes problems with family and friends. Psychological dependence has developed when your mind tells you that the drug is needed. This is usually followed by physical dependence which has developed when continuing increases of drug are required to get the same feeling or "high". This is known as addiction or chemical dependency. A person's risk is much higher if there is a history of chemical dependency in the family. SIGNS OF CHEMICAL DEPENDENCY  You have been told by friends or family that drugs have become a problem.  You fight when using drugs.  You are having blackouts (not remembering what you do while using).  You feel sick from using drugs but continue using.  You lie about use or amounts of drugs (chemicals) used.  You need chemicals to get you going.  You are suffering in work performance or in school because of drug use.  You get sick from use of drugs but continue to use anyway.  You need drugs to relate to people or feel comfortable in social situations.  You use drugs to forget problems. "Yes" answered to any of the above signs of chemical dependency  indicates there are problems. The longer the use of drugs continues, the greater the problems will become. If there is a family history of drug or alcohol use, it is best not to experiment with these drugs. Continual use leads to tolerance. After tolerance develops more of the drug is needed to get the same feeling. This is followed by addiction. With addiction, drugs become the most important part of life. It becomes more important to take drugs than participate in the other usual activities of life. This includes relating to friends and family. Addiction is followed by dependency. Dependency is a condition where drugs are now needed not just to get high, but to feel normal. Addiction cannot be cured but it can be stopped. This often requires outside help and the care of professionals. Treatment centers are listed in the yellow pages under: Cocaine, Narcotics, and Alcoholics Anonymous. Most hospitals and clinics can refer you to a specialized care center. Talk to your caregiver if you need help. Document Released: 06/04/2005 Document Revised: 01/05/2012 Document Reviewed: 10/13/2005 [facility] Patient Information 2015 [place], [place]. This information is not intended to replace advice given to you by your health care provider. Make sure you discuss any questions you have with your health care provider.

## 2015-03-25 NOTE — ED Provider Notes (Signed)
CSN: [id number]     Arrival date & time 03/25/15  1117 History   First MD Initiated Contact with Patient 03/25/15 1138     Chief Complaint  Patient presents with  . Alcohol Problem     HPI  She presents for evaluation of "I abuse a lot of drugs and I want to get help". States he was here last night states he was "really drunk". I also admitted to use of several street drugs including vitamins cocaine and marijuana. States he was told that he would have to follow up as an outpatient was given a list of multiple referrals. He presents stating that he is upset because "none of them could help until Tuesday". Today is Sunday on Memorial weekend. Did discuss with him that this was all quite sure that we will screen him for any acute or not is related to his drug and alcohol abuse. However, it was likely due to be discharged to continue to seek outpatient resources.    Past Medical History  Diagnosis Date  . Narcotic abuse   . Peptic ulcer   . Renal disorder     kidney stones  . Kidney stones   . Polysubstance abuse   . Cocaine abuse   . Alcohol abuse    Past Surgical History  Procedure Laterality Date  . Hand surgery  2006  . Esophagogastroduodenoscopy N/A 05/29/2014    Procedure: ESOPHAGOGASTRODUODENOSCOPY (EGD) ;  Surgeon: Beryle Beams, MD;  Location[facility] ENDOSCOPY;  Service: Endoscopy;  Laterality: N/A;  check with Dr. Collene Mares about sedation type/timinng - I recommend MAC   No family history on file. History  Substance Use Topics  . Smoking status: Current Every Day Smoker -- 0.50 packs/day    Types: Cigarettes  . Smokeless tobacco: Not on file  . Alcohol Use: Yes     Comment: daily    Review of Systems  Constitutional: Negative for fever, chills, diaphoresis, appetite change and fatigue.  HENT: Negative for mouth sores, sore throat and trouble swallowing.   Eyes: Negative for visual disturbance.  Respiratory: Negative for cough, chest tightness, shortness of breath and  wheezing.   Cardiovascular: Negative for chest pain.  Gastrointestinal: Negative for nausea, vomiting, abdominal pain, diarrhea and abdominal distention.  Endocrine: Negative for polydipsia, polyphagia and polyuria.  Genitourinary: Negative for dysuria, frequency and hematuria.  Musculoskeletal: Negative for gait problem.  Skin: Negative for color change, pallor and rash.  Neurological: Negative for dizziness, syncope, light-headedness and headaches.  Hematological: Does not bruise/bleed easily.  Psychiatric/Behavioral: Negative for behavioral problems and confusion.      Allergies  Review of patient's allergies indicates no known allergies.  Home Medications   Prior to Admission medications   Medication Sig Start Date End Date Taking? Authorizing Provider  carbamazepine (TEGRETOL) 200 MG tablet 800mg  PO QD X 1D, then 600mg  PO QD X 1D, then 400mg  QD X 1D, then 200mg  PO QD X 2D 03/24/15  Yes Domenic Moras, PA-C  dexamethasone (DECADRON) 4 MG tablet Take 1 tablet (4 mg total) by mouth 2 (two) times daily with a meal. Patient not taking: Reported on 03/25/2015 12/26/14  Yes Lily Kocher, PA-C  ibuprofen (ADVIL,MOTRIN) 200 MG tablet Take 400 mg by mouth every 6 (six) hours as needed for mild pain.    Yes Historical Provider, MD  ondansetron (ZOFRAN) 4 MG tablet Take 1 tablet (4 mg total) by mouth every 8 (eight) hours as needed for nausea or vomiting. Patient not taking: Reported on 03/25/2015  03/08/15 03/07/16 Yes Hinda Kehr, MD  tamsulosin (FLOMAX) 0.4 MG CAPS capsule Take 1 capsule (0.4 mg total) by mouth daily. Patient not taking: Reported on 03/25/2015 03/08/15  Yes Hinda Kehr, MD  acetaminophen-codeine (TYLENOL #3) 300-30 MG per tablet Take 1-2 tablets by mouth every 6 (six) hours as needed. Patient not taking: Reported on 01/01/2015 12/26/14   Lily Kocher, PA-C  chlordiazePOXIDE (LIBRIUM) 25 MG capsule 50mg  PO TID x 1D, then 25-50mg  PO BID X 1D, then 25-50mg  PO QD X 1D Patient not taking:  Reported on 03/25/2015 03/25/15   Tanna Furry, MD  diazepam (VALIUM) 5 MG tablet 1 po tid with food Patient not taking: Reported on 01/01/2015 12/26/14   Lily Kocher, PA-C  dicyclomine (BENTYL) 20 MG tablet Take 1 tablet (20 mg total) by mouth 2 (two) times daily. Patient not taking: Reported on 12/26/2014 06/01/14   Alvina Chou, PA-C  HYDROcodone-acetaminophen (NORCO/VICODIN) 5-325 MG per tablet Take 1-2 tablets by mouth every 4 (four) hours as needed for moderate pain or severe pain. Patient not taking: Reported on 12/26/2014 11/02/14   Clayton Bibles, PA-C  methocarbamol (ROBAXIN) 750 MG tablet Take 1 tablet (750 mg total) by mouth every 6 (six) hours as needed for muscle spasms (and pain). Patient not taking: Reported on 12/26/2014 11/02/14   Clayton Bibles, PA-C  oxyCODONE (OXY IR/ROXICODONE) 5 MG immediate release tablet Take 1 tablet (5 mg total) by mouth every 4 (four) hours as needed for moderate pain. Patient not taking: Reported on 12/26/2014 05/29/14   Debbe Odea, MD  pantoprazole (PROTONIX) 40 MG tablet Take 1 tablet (40 mg total) by mouth daily. Patient not taking: Reported on 12/26/2014 05/29/14   Debbe Odea, MD  promethazine (PHENERGAN) 25 MG tablet Take 1 tablet (25 mg total) by mouth every 6 (six) hours as needed for nausea or vomiting. Patient not taking: Reported on 12/26/2014 06/01/14   Alvina Chou, PA-C   BP 119/82 mmHg  Pulse 90  Temp(Src) 98.3 F (36.8 C) (Oral)  Resp 18  SpO2 98% Physical Exam  Constitutional: He is oriented to person, place, and time. He appears well-developed and well-nourished. No distress.  HENT:  Head: Normocephalic.  Eyes: Conjunctivae are normal. Pupils are equal, round, and reactive to light. No scleral icterus.  Neck: Normal range of motion. Neck supple. No thyromegaly present.  Cardiovascular: Normal rate and regular rhythm.  Exam reveals no gallop and no friction rub.   No murmur heard. Pulmonary/Chest: Effort normal and breath sounds normal. No  respiratory distress. He has no wheezes. He has no rales.  Abdominal: Soft. Bowel sounds are normal. He exhibits no distension. There is no tenderness. There is no rebound.  Musculoskeletal: Normal range of motion.  Neurological: He is alert and oriented to person, place, and time.  Skin: Skin is warm and dry. No rash noted.  Psychiatric: He has a normal mood and affect. His behavior is normal.    ED Course  Procedures (including critical care time) Labs Review Labs Reviewed - No data to display  Imaging Review No results found.   EKG Interpretation None      MDM   Final diagnoses:  Substance abuse    Patient shows no acute distress. Clinically shows no signs of withdrawal. He is not tachycardic. He does not appear nervous anxious. He does not have tremor. I discussed with him at length that inpatient detox is not available through the system. Stated that I would be comfortable prescribing him Librium to use for  prophylaxis for any withdrawal symptoms.  Continue to restate that "several years ago that they were able to help me in the foot in the hospital for this". Again tried to reinforce to him on several conversations that he would have to follow-up for outpatient treatment.    Tanna Furry, MD 03/25/15 [phone]

## 2015-03-25 NOTE — Progress Notes (Signed)
3:06pm. CSW met with pt to discuss substance abuse treatment resources. Pt had already arranged for bed and transit to [facility]--however, [facility] could not accept without a [facility] authorization. Only a hospital can get this authorization code.  CSW staffed case with MD. CSW obtained authorization code for patient: [id number].   CSW communicated code to [place], workers Ivin Booty and Mickel Baas. Pt HAS A BED TONIGHT. [facility] [place] can discharge. CSW to sign off.  [place] Worker [facility] phone: [phone]

## 2015-03-25 NOTE — ED Notes (Signed)
Pt states here last nite and returned related to drinking etoh.  Pt reports opiate, opana IV, heorin, crack/cocaine.  Pt is here for detox program and wants to get off everything.  No SI/HI.

## 2015-04-14 ENCOUNTER — Emergency Department (HOSPITAL_COMMUNITY)
Admission: EM | Admit: 2015-04-14 | Discharge: 2015-04-14 | Disposition: A | Discharge status: Home / Self Care | Payer: Self-pay | Attending: Emergency Medicine | Admitting: Emergency Medicine

## 2015-04-14 ENCOUNTER — Emergency Department (HOSPITAL_COMMUNITY)

## 2015-04-14 ENCOUNTER — Encounter (HOSPITAL_COMMUNITY): Payer: Self-pay | Admitting: Emergency Medicine

## 2015-04-14 DIAGNOSIS — R1110 Vomiting, unspecified: Secondary | ICD-10-CM | POA: Insufficient documentation

## 2015-04-14 DIAGNOSIS — Z87442 Personal history of urinary calculi: Secondary | ICD-10-CM | POA: Insufficient documentation

## 2015-04-14 DIAGNOSIS — Z8711 Personal history of peptic ulcer disease: Secondary | ICD-10-CM | POA: Insufficient documentation

## 2015-04-14 DIAGNOSIS — Z720 Tobacco use: Secondary | ICD-10-CM | POA: Insufficient documentation

## 2015-04-14 DIAGNOSIS — Z79899 Other long term (current) drug therapy: Secondary | ICD-10-CM | POA: Insufficient documentation

## 2015-04-14 DIAGNOSIS — R1084 Generalized abdominal pain: Secondary | ICD-10-CM | POA: Insufficient documentation

## 2015-04-14 DIAGNOSIS — R109 Unspecified abdominal pain: Secondary | ICD-10-CM

## 2015-04-14 LAB — CBC WITH DIFFERENTIAL/PLATELET
BASOS ABS: 0 10*3/uL (ref 0.0–0.1)
Basophils Relative: 0 % (ref 0–1)
EOS PCT: 1 % (ref 0–5)
Eosinophils Absolute: 0.1 10*3/uL (ref 0.0–0.7)
HCT: 44 % (ref 39.0–52.0)
Hemoglobin: 14.8 g/dL (ref 13.0–17.0)
LYMPHS PCT: 14 % (ref 12–46)
Lymphs Abs: 1.6 10*3/uL (ref 0.7–4.0)
MCH: 30.9 pg (ref 26.0–34.0)
MCHC: 33.6 g/dL (ref 30.0–36.0)
MCV: 91.9 fL (ref 78.0–100.0)
Monocytes Absolute: 0.8 10*3/uL (ref 0.1–1.0)
Monocytes Relative: 7 % (ref 3–12)
Neutro Abs: 8.7 10*3/uL — ABNORMAL HIGH (ref 1.7–7.7)
Neutrophils Relative %: 78 % — ABNORMAL HIGH (ref 43–77)
PLATELETS: 272 10*3/uL (ref 150–400)
RBC: 4.79 MIL/uL (ref 4.22–5.81)
RDW: 13.4 % (ref 11.5–15.5)
WBC: 11.1 10*3/uL — ABNORMAL HIGH (ref 4.0–10.5)

## 2015-04-14 LAB — URINALYSIS, ROUTINE W REFLEX MICROSCOPIC
BILIRUBIN URINE: NEGATIVE
GLUCOSE, UA: NEGATIVE mg/dL
HGB URINE DIPSTICK: NEGATIVE
Ketones, ur: NEGATIVE mg/dL
Leukocytes, UA: NEGATIVE
Nitrite: NEGATIVE
Protein, ur: NEGATIVE mg/dL
Specific Gravity, Urine: 1.025 (ref 1.005–1.030)
UROBILINOGEN UA: 0.2 mg/dL (ref 0.0–1.0)
pH: 7 (ref 5.0–8.0)

## 2015-04-14 LAB — COMPREHENSIVE METABOLIC PANEL
ALT: 16 U/L — AB (ref 17–63)
AST: 19 U/L (ref 15–41)
Albumin: 4.7 g/dL (ref 3.5–5.0)
Alkaline Phosphatase: 67 U/L (ref 38–126)
Anion gap: 8 (ref 5–15)
BUN: 14 mg/dL (ref 6–20)
CALCIUM: 9.4 mg/dL (ref 8.9–10.3)
CO2: 27 mmol/L (ref 22–32)
CREATININE: 1.11 mg/dL (ref 0.61–1.24)
Chloride: 106 mmol/L (ref 101–111)
Glucose, Bld: 103 mg/dL — ABNORMAL HIGH (ref 65–99)
Potassium: 4.1 mmol/L (ref 3.5–5.1)
SODIUM: 141 mmol/L (ref 135–145)
Total Bilirubin: 0.6 mg/dL (ref 0.3–1.2)
Total Protein: 8 g/dL (ref 6.5–8.1)

## 2015-04-14 LAB — LIPASE, BLOOD: LIPASE: 22 U/L (ref 22–51)

## 2015-04-14 LAB — POC OCCULT BLOOD, ED: FECAL OCCULT BLD: NEGATIVE

## 2015-04-14 MED ORDER — SODIUM CHLORIDE 0.9 % IV BOLUS (SEPSIS)
1000.0000 mL | Freq: Once | INTRAVENOUS | Status: AC
  Administered 2015-04-14: 1000 mL via INTRAVENOUS

## 2015-04-14 MED ORDER — LORAZEPAM 2 MG/ML IJ SOLN
1.0000 mg | Freq: Once | INTRAMUSCULAR | Status: AC
  Administered 2015-04-14: 1 mg via INTRAVENOUS
  Filled 2015-04-14: qty 1

## 2015-04-14 MED ORDER — ONDANSETRON HCL 4 MG/2ML IJ SOLN
4.0000 mg | Freq: Once | INTRAMUSCULAR | Status: AC
  Administered 2015-04-14: 4 mg via INTRAVENOUS
  Filled 2015-04-14: qty 2

## 2015-04-14 MED ORDER — SODIUM CHLORIDE 0.9 % IV SOLN
INTRAVENOUS | Status: DC
  Administered 2015-04-14: 16:00:00 via INTRAVENOUS

## 2015-04-14 MED ORDER — IOHEXOL 300 MG/ML  SOLN
100.0000 mL | Freq: Once | INTRAMUSCULAR | Status: AC | PRN
  Administered 2015-04-14: 100 mL via INTRAVENOUS

## 2015-04-14 MED ORDER — IOHEXOL 300 MG/ML  SOLN
25.0000 mL | Freq: Once | INTRAMUSCULAR | Status: AC | PRN
  Administered 2015-04-14: 50 mL via ORAL

## 2015-04-14 NOTE — ED Notes (Signed)
CT contrast completed

## 2015-04-14 NOTE — ED Provider Notes (Signed)
CSN: [id number]     Arrival date & time 04/14/15  1551 History   First MD Initiated Contact with Patient 04/14/15 1554     No chief complaint on file.    (Consider location/radiation/quality/duration/timing/severity/associated sxs/prior Treatment) HPI Comments: Complaining of 2 days of abdominal pain with subjective bloody stools. Notes emesis 3 today that was nonbilious and nonbloody. Denies any fever or chills. Does have a history of alcohol abuse. Seen in the department multiple times for polysubstance abuse. He is currently under custody by the [facility] department. Denies any urinary symptoms. Also complains of left-sided flank pain. Does have a history of diverticulitis and feels that this is similar. Symptoms have been persistent and nothing makes them better or worse. No treatment used prior to arrival.  The history is provided by the patient and the police.    Past Medical History  Diagnosis Date  . Narcotic abuse   . Peptic ulcer   . Renal disorder     kidney stones  . Kidney stones   . Polysubstance abuse   . Cocaine abuse   . Alcohol abuse    Past Surgical History  Procedure Laterality Date  . Hand surgery  2006  . Esophagogastroduodenoscopy N/A 05/29/2014    Procedure: ESOPHAGOGASTRODUODENOSCOPY (EGD) ;  Surgeon: Beryle Beams, MD;  Location: [facility] ENDOSCOPY;  Service: Endoscopy;  Laterality: N/A;  check with Dr. Collene Mares about sedation type/timinng - I recommend MAC   History reviewed. No pertinent family history. History  Substance Use Topics  . Smoking status: Current Every Day Smoker -- 0.50 packs/day    Types: Cigarettes  . Smokeless tobacco: Not on file  . Alcohol Use: Yes     Comment: daily    Review of Systems  All other systems reviewed and are negative.     Allergies  Review of patient's allergies indicates no known allergies.  Home Medications   Prior to Admission medications   Medication Sig Start Date End Date Taking? Authorizing Provider    acetaminophen-codeine (TYLENOL #3) 300-30 MG per tablet Take 1-2 tablets by mouth every 6 (six) hours as needed. Patient not taking: Reported on 01/01/2015 12/26/14   Lily Kocher, PA-C  carbamazepine (TEGRETOL) 200 MG tablet 800mg  PO QD X 1D, then 600mg  PO QD X 1D, then 400mg  QD X 1D, then 200mg  PO QD X 2D 03/24/15   Domenic Moras, PA-C  chlordiazePOXIDE (LIBRIUM) 25 MG capsule 50mg  PO TID x 1D, then 25-50mg  PO BID X 1D, then 25-50mg  PO QD X 1D Patient not taking: Reported on 03/25/2015 03/25/15   Tanna Furry, MD  dexamethasone (DECADRON) 4 MG tablet Take 1 tablet (4 mg total) by mouth 2 (two) times daily with a meal. Patient not taking: Reported on 03/25/2015 12/26/14   Lily Kocher, PA-C  diazepam (VALIUM) 5 MG tablet 1 po tid with food Patient not taking: Reported on 01/01/2015 12/26/14   Lily Kocher, PA-C  dicyclomine (BENTYL) 20 MG tablet Take 1 tablet (20 mg total) by mouth 2 (two) times daily. Patient not taking: Reported on 12/26/2014 06/01/14   Alvina Chou, PA-C  HYDROcodone-acetaminophen (NORCO/VICODIN) 5-325 MG per tablet Take 1-2 tablets by mouth every 4 (four) hours as needed for moderate pain or severe pain. Patient not taking: Reported on 12/26/2014 11/02/14   Clayton Bibles, PA-C  ibuprofen (ADVIL,MOTRIN) 200 MG tablet Take 400 mg by mouth every 6 (six) hours as needed for mild pain.     Historical Provider, MD  methocarbamol (ROBAXIN) 750 MG tablet Take 1  tablet (750 mg total) by mouth every 6 (six) hours as needed for muscle spasms (and pain). Patient not taking: Reported on 12/26/2014 11/02/14   Clayton Bibles, PA-C  ondansetron (ZOFRAN) 4 MG tablet Take 1 tablet (4 mg total) by mouth every 8 (eight) hours as needed for nausea or vomiting. Patient not taking: Reported on 03/25/2015 03/08/15 03/07/16  Hinda Kehr, MD  oxyCODONE (OXY IR/ROXICODONE) 5 MG immediate release tablet Take 1 tablet (5 mg total) by mouth every 4 (four) hours as needed for moderate pain. Patient not taking: Reported on 12/26/2014  05/29/14   Debbe Odea, MD  pantoprazole (PROTONIX) 40 MG tablet Take 1 tablet (40 mg total) by mouth daily. Patient not taking: Reported on 12/26/2014 05/29/14   Debbe Odea, MD  promethazine (PHENERGAN) 25 MG tablet Take 1 tablet (25 mg total) by mouth every 6 (six) hours as needed for nausea or vomiting. Patient not taking: Reported on 12/26/2014 06/01/14   Alvina Chou, PA-C  tamsulosin (FLOMAX) 0.4 MG CAPS capsule Take 1 capsule (0.4 mg total) by mouth daily. Patient not taking: Reported on 03/25/2015 03/08/15   Hinda Kehr, MD   BP 134/90 mmHg  Pulse 81  Temp(Src) 98.6 F (37 C) (Oral)  Resp 22  SpO2 98% Physical Exam  Constitutional: He is oriented to person, place, and time. He appears well-developed and well-nourished.  Non-toxic appearance. No distress.  HENT:  Head: Normocephalic and atraumatic.  Eyes: Conjunctivae, EOM and lids are normal. Pupils are equal, round, and reactive to light.  Neck: Normal range of motion. Neck supple. No tracheal deviation present. No thyroid mass present.  Cardiovascular: Normal rate, regular rhythm and normal heart sounds.  Exam reveals no gallop.   No murmur heard. Pulmonary/Chest: Effort normal and breath sounds normal. No stridor. No respiratory distress. He has no decreased breath sounds. He has no wheezes. He has no rhonchi. He has no rales.  Abdominal: Soft. Normal appearance and bowel sounds are normal. He exhibits no distension. There is generalized tenderness. There is no rigidity, no rebound, no guarding and no CVA tenderness.    Musculoskeletal: Normal range of motion. He exhibits no edema or tenderness.  Neurological: He is alert and oriented to person, place, and time. He has normal strength. No cranial nerve deficit or sensory deficit. GCS eye subscore is 4. GCS verbal subscore is 5. GCS motor subscore is 6.  Skin: Skin is warm and dry. No abrasion and no rash noted.  Psychiatric: He has a normal mood and affect. His speech is normal  and behavior is normal.  Nursing note and vitals reviewed.   ED Course  Procedures (including critical care time) Labs Review Labs Reviewed  CBC WITH DIFFERENTIAL/PLATELET  COMPREHENSIVE METABOLIC PANEL  LIPASE, BLOOD  URINALYSIS, ROUTINE W REFLEX MICROSCOPIC (NOT AT [facility])  POC OCCULT BLOOD, ED    Imaging Review No results found.   EKG Interpretation None      MDM   Final diagnoses:  Abdominal pain    Patient given IV fluids, Zofran, Ativan. Abdominal CT is negative. Patient stable for discharge. He is Hemoccult-negative    Lacretia Leigh, MD 04/14/15 1911

## 2015-04-14 NOTE — ED Notes (Signed)
Pt was picked up by [employer] police on a warrant. Began complaining of right lower abd pain, chills, vomiting, with bloody diarrhea for the last couple days (pt later states on going for the last week). Also complaining of lower left back pain with numbness going into left leg. Denies pain with urination

## 2015-04-14 NOTE — Discharge Instructions (Signed)

## 2015-05-19 ENCOUNTER — Emergency Department (HOSPITAL_COMMUNITY)
Admission: EM | Admit: 2015-05-19 | Discharge: 2015-05-19 | Disposition: A | Discharge status: Home / Self Care | Attending: Emergency Medicine | Admitting: Emergency Medicine

## 2015-05-19 ENCOUNTER — Encounter (HOSPITAL_COMMUNITY): Payer: Self-pay | Admitting: Emergency Medicine

## 2015-05-19 DIAGNOSIS — Z720 Tobacco use: Secondary | ICD-10-CM | POA: Insufficient documentation

## 2015-05-19 DIAGNOSIS — Z87442 Personal history of urinary calculi: Secondary | ICD-10-CM | POA: Insufficient documentation

## 2015-05-19 DIAGNOSIS — K089 Disorder of teeth and supporting structures, unspecified: Secondary | ICD-10-CM

## 2015-05-19 DIAGNOSIS — G8929 Other chronic pain: Secondary | ICD-10-CM | POA: Insufficient documentation

## 2015-05-19 DIAGNOSIS — K0381 Cracked tooth: Secondary | ICD-10-CM | POA: Insufficient documentation

## 2015-05-19 DIAGNOSIS — K088 Other specified disorders of teeth and supporting structures: Secondary | ICD-10-CM | POA: Insufficient documentation

## 2015-05-19 DIAGNOSIS — Z8719 Personal history of other diseases of the digestive system: Secondary | ICD-10-CM | POA: Insufficient documentation

## 2015-05-19 DIAGNOSIS — Z87448 Personal history of other diseases of urinary system: Secondary | ICD-10-CM | POA: Insufficient documentation

## 2015-05-19 MED ORDER — IBUPROFEN 600 MG PO TABS: 600.0000 mg | ORAL_TABLET | Freq: Four times a day (QID) | ORAL | Status: DC | PRN

## 2015-05-19 MED ORDER — KETOROLAC TROMETHAMINE 60 MG/2ML IM SOLN
60.0000 mg | Freq: Once | INTRAMUSCULAR | Status: AC
  Administered 2015-05-19: 60 mg via INTRAMUSCULAR
  Filled 2015-05-19: qty 2

## 2015-05-19 MED ORDER — PENICILLIN V POTASSIUM 500 MG PO TABS: 500.0000 mg | ORAL_TABLET | Freq: Four times a day (QID) | ORAL | Status: AC

## 2015-05-19 NOTE — ED Notes (Signed)
MD made aware of pt's BP.  Patient sleeping in room at time of BP.  This RN woke pt up and we ambulated in hall to stimulate.  BP now 103/56

## 2015-05-19 NOTE — ED Notes (Signed)
MD at bedside. 

## 2015-05-19 NOTE — Discharge Instructions (Signed)

## 2015-05-19 NOTE — ED Notes (Addendum)
Patient with broken tooth upper left jaw, with pain and unable to sleep.  Patient also having fever for the last few days.  There is some swelling noted to upper jaw.

## 2015-05-19 NOTE — ED Provider Notes (Signed)
CSN: [id number]     Arrival date & time 05/19/15  0308 History   First MD Initiated Contact with Patient 05/19/15 0542     Chief Complaint  Patient presents with  . Dental Pain     (Consider location/radiation/quality/duration/timing/severity/associated sxs/prior Treatment) HPI Patient presents with ongoing left upper molar dental pain for the past few months worse over the last day. He thinks he may have broken part of the tooth off. He has yet to see a dentist. Denies any facial swelling. Patient with previous history of polysubstance abuse. No fever or chills. Past Medical History  Diagnosis Date  . Narcotic abuse   . Peptic ulcer   . Renal disorder     kidney stones  . Kidney stones   . Polysubstance abuse   . Cocaine abuse   . Alcohol abuse    Past Surgical History  Procedure Laterality Date  . Hand surgery  2006  . Esophagogastroduodenoscopy N/A 05/29/2014    Procedure: ESOPHAGOGASTRODUODENOSCOPY (EGD) ;  Surgeon: Beryle Beams, MD;  Location: [facility] ENDOSCOPY;  Service: Endoscopy;  Laterality: N/A;  check with Dr. Collene Mares about sedation type/timinng - I recommend MAC   No family history on file. History  Substance Use Topics  . Smoking status: Current Every Day Smoker -- 0.50 packs/day    Types: Cigarettes  . Smokeless tobacco: Not on file  . Alcohol Use: Yes     Comment: daily    Review of Systems  Constitutional: Negative for fever and chills.  HENT: Positive for dental problem. Negative for facial swelling.   Respiratory: Negative for shortness of breath.   Cardiovascular: Negative for chest pain.  Gastrointestinal: Negative for nausea, vomiting and abdominal pain.  Musculoskeletal: Negative for back pain, neck pain and neck stiffness.  Skin: Negative for rash and wound.  Neurological: Negative for dizziness, weakness, light-headedness, numbness and headaches.  All other systems reviewed and are negative.     Allergies  Review of patient's allergies indicates  no known allergies.  Home Medications   Prior to Admission medications   Medication Sig Start Date End Date Taking? Authorizing Provider  acetaminophen-codeine (TYLENOL #3) 300-30 MG per tablet Take 1-2 tablets by mouth every 6 (six) hours as needed. Patient not taking: Reported on 01/01/2015 12/26/14   Lily Kocher, PA-C  carbamazepine (TEGRETOL) 200 MG tablet 800mg  PO QD X 1D, then 600mg  PO QD X 1D, then 400mg  QD X 1D, then 200mg  PO QD X 2D Patient not taking: Reported on 04/14/2015 03/24/15   Domenic Moras, PA-C  chlordiazePOXIDE (LIBRIUM) 25 MG capsule 50mg  PO TID x 1D, then 25-50mg  PO BID X 1D, then 25-50mg  PO QD X 1D Patient not taking: Reported on 03/25/2015 03/25/15   Tanna Furry, MD  dexamethasone (DECADRON) 4 MG tablet Take 1 tablet (4 mg total) by mouth 2 (two) times daily with a meal. Patient not taking: Reported on 03/25/2015 12/26/14   Lily Kocher, PA-C  diazepam (VALIUM) 5 MG tablet 1 po tid with food Patient not taking: Reported on 01/01/2015 12/26/14   Lily Kocher, PA-C  dicyclomine (BENTYL) 20 MG tablet Take 1 tablet (20 mg total) by mouth 2 (two) times daily. Patient not taking: Reported on 12/26/2014 06/01/14   Alvina Chou, PA-C  HYDROcodone-acetaminophen (NORCO/VICODIN) 5-325 MG per tablet Take 1-2 tablets by mouth every 4 (four) hours as needed for moderate pain or severe pain. Patient not taking: Reported on 12/26/2014 11/02/14   Clayton Bibles, PA-C  ibuprofen (ADVIL,MOTRIN) 600 MG tablet Take 1  tablet (600 mg total) by mouth every 6 (six) hours as needed. 05/19/15   Julianne Rice, MD  methocarbamol (ROBAXIN) 750 MG tablet Take 1 tablet (750 mg total) by mouth every 6 (six) hours as needed for muscle spasms (and pain). Patient not taking: Reported on 12/26/2014 11/02/14   Clayton Bibles, PA-C  ondansetron (ZOFRAN) 4 MG tablet Take 1 tablet (4 mg total) by mouth every 8 (eight) hours as needed for nausea or vomiting. Patient not taking: Reported on 03/25/2015 03/08/15 03/07/16  Hinda Kehr, MD    oxyCODONE (OXY IR/ROXICODONE) 5 MG immediate release tablet Take 1 tablet (5 mg total) by mouth every 4 (four) hours as needed for moderate pain. Patient not taking: Reported on 12/26/2014 05/29/14   Debbe Odea, MD  pantoprazole (PROTONIX) 40 MG tablet Take 1 tablet (40 mg total) by mouth daily. Patient not taking: Reported on 12/26/2014 05/29/14   Debbe Odea, MD  penicillin v potassium (VEETID) 500 MG tablet Take 1 tablet (500 mg total) by mouth 4 (four) times daily. 05/19/15 05/26/15  Julianne Rice, MD  promethazine (PHENERGAN) 25 MG tablet Take 1 tablet (25 mg total) by mouth every 6 (six) hours as needed for nausea or vomiting. Patient not taking: Reported on 12/26/2014 06/01/14   Alvina Chou, PA-C  tamsulosin (FLOMAX) 0.4 MG CAPS capsule Take 1 capsule (0.4 mg total) by mouth daily. Patient not taking: Reported on 03/25/2015 03/08/15   Hinda Kehr, MD   BP 99/58 mmHg  Pulse 72  Temp(Src) 99.2 F (37.3 C) (Oral)  Resp 18  Ht 5\' 8"  (1.727 m)  Wt 145 lb (65.772 kg)  BMI 22.05 kg/m2  SpO2 95% Physical Exam  Constitutional: He is oriented to person, place, and time. He appears well-developed and well-nourished. No distress.  Patient is drowsy but easily aroused.  HENT:  Head: Normocephalic and atraumatic.  Mouth/Throat: Oropharynx is clear and moist.    Eyes: EOM are normal. Pupils are equal, round, and reactive to light.  Neck: Normal range of motion. Neck supple.  Cardiovascular: Normal rate and regular rhythm.   Pulmonary/Chest: Effort normal and breath sounds normal.  Abdominal: Soft. Bowel sounds are normal.  Musculoskeletal: Normal range of motion. He exhibits no edema or tenderness.  Lymphadenopathy:    He has no cervical adenopathy.  Neurological: He is oriented to person, place, and time.  Drowsy but easily aroused. Ambulating without difficulty.  Skin: Skin is warm and dry. No rash noted. No erythema.  Psychiatric: He has a normal mood and affect. His behavior is  normal.  Nursing note and vitals reviewed.   ED Course  Procedures (including critical care time) Labs Review Labs Reviewed - No data to display  Imaging Review No results found.   EKG Interpretation None      MDM   Final diagnoses:  Chronic dental pain    Patient with exacerbation of his chronic ongoing dental pain. Have referred to dentistry. Return precautions given.    Julianne Rice, MD 05/19/15 [phone]

## 2015-05-20 ENCOUNTER — Encounter (HOSPITAL_COMMUNITY): Payer: Self-pay | Admitting: *Deleted

## 2015-05-20 ENCOUNTER — Inpatient Hospital Stay (HOSPITAL_COMMUNITY)
Admission: EM | Admit: 2015-05-20 | Discharge: 2015-05-21 | DRG: 603 | Discharge status: Against Medical Advice | Attending: Internal Medicine | Admitting: Internal Medicine

## 2015-05-20 DIAGNOSIS — L03119 Cellulitis of unspecified part of limb: Secondary | ICD-10-CM

## 2015-05-20 DIAGNOSIS — R300 Dysuria: Secondary | ICD-10-CM | POA: Diagnosis present

## 2015-05-20 DIAGNOSIS — B9689 Other specified bacterial agents as the cause of diseases classified elsewhere: Secondary | ICD-10-CM | POA: Diagnosis not present

## 2015-05-20 DIAGNOSIS — Z5900 Homelessness unspecified: Secondary | ICD-10-CM

## 2015-05-20 DIAGNOSIS — F17210 Nicotine dependence, cigarettes, uncomplicated: Secondary | ICD-10-CM | POA: Diagnosis present

## 2015-05-20 DIAGNOSIS — F1190 Opioid use, unspecified, uncomplicated: Secondary | ICD-10-CM | POA: Insufficient documentation

## 2015-05-20 DIAGNOSIS — F17200 Nicotine dependence, unspecified, uncomplicated: Secondary | ICD-10-CM | POA: Diagnosis present

## 2015-05-20 DIAGNOSIS — F1110 Opioid abuse, uncomplicated: Secondary | ICD-10-CM | POA: Diagnosis present

## 2015-05-20 DIAGNOSIS — L0390 Cellulitis, unspecified: Secondary | ICD-10-CM | POA: Diagnosis present

## 2015-05-20 DIAGNOSIS — F1910 Other psychoactive substance abuse, uncomplicated: Secondary | ICD-10-CM | POA: Diagnosis not present

## 2015-05-20 DIAGNOSIS — L03113 Cellulitis of right upper limb: Principal | ICD-10-CM

## 2015-05-20 LAB — COMPREHENSIVE METABOLIC PANEL
ALBUMIN: 3.1 g/dL — AB (ref 3.5–5.0)
ALT: 23 U/L (ref 17–63)
ANION GAP: 8 (ref 5–15)
AST: 26 U/L (ref 15–41)
Alkaline Phosphatase: 71 U/L (ref 38–126)
BILIRUBIN TOTAL: 0.3 mg/dL (ref 0.3–1.2)
BUN: 7 mg/dL (ref 6–20)
CO2: 28 mmol/L (ref 22–32)
CREATININE: 1.24 mg/dL (ref 0.61–1.24)
Calcium: 8.8 mg/dL — ABNORMAL LOW (ref 8.9–10.3)
Chloride: 100 mmol/L — ABNORMAL LOW (ref 101–111)
GFR calc Af Amer: >60 mL/min (ref >60)
Glucose, Bld: 99 mg/dL (ref 65–99)
Potassium: 3.7 mmol/L (ref 3.5–5.1)
Sodium: 136 mmol/L (ref 135–145)
Total Protein: 6.7 g/dL (ref 6.5–8.1)

## 2015-05-20 LAB — CBC
HEMATOCRIT: 41.3 % (ref 39.0–52.0)
Hemoglobin: 13.5 g/dL (ref 13.0–17.0)
MCH: 29.5 pg (ref 26.0–34.0)
MCHC: 32.7 g/dL (ref 30.0–36.0)
MCV: 90.2 fL (ref 78.0–100.0)
PLATELETS: 279 10*3/uL (ref 150–400)
RBC: 4.58 MIL/uL (ref 4.22–5.81)
RDW: 13 % (ref 11.5–15.5)
WBC: 6.6 10*3/uL (ref 4.0–10.5)

## 2015-05-20 LAB — ETHANOL: Alcohol, Ethyl (B): <5 mg/dL (ref <5)

## 2015-05-20 LAB — RAPID URINE DRUG SCREEN, HOSP PERFORMED
Amphetamines: NOT DETECTED
BENZODIAZEPINES: POSITIVE — AB
Barbiturates: NOT DETECTED
COCAINE: POSITIVE — AB
Opiates: POSITIVE — AB
Tetrahydrocannabinol: POSITIVE — AB

## 2015-05-20 LAB — I-STAT CG4 LACTIC ACID, ED: Lactic Acid, Venous: 0.64 mmol/L (ref 0.5–2.0)

## 2015-05-20 MED ORDER — VITAMIN B-1 100 MG PO TABS
100.0000 mg | ORAL_TABLET | Freq: Every day | ORAL | Status: DC
  Administered 2015-05-20 – 2015-05-21 (×2): 100 mg via ORAL
  Filled 2015-05-20 (×2): qty 1

## 2015-05-20 MED ORDER — HYDROXYZINE HCL 50 MG/ML IM SOLN
50.0000 mg | INTRAMUSCULAR | Status: DC | PRN
  Administered 2015-05-21: 50 mg via INTRAMUSCULAR
  Filled 2015-05-20 (×2): qty 1

## 2015-05-20 MED ORDER — SODIUM CHLORIDE 0.9 % IV BOLUS (SEPSIS)
1000.0000 mL | Freq: Once | INTRAVENOUS | Status: AC
Start: 2015-05-20 — End: 2015-05-20
  Administered 2015-05-20: 1000 mL via INTRAVENOUS

## 2015-05-20 MED ORDER — DIAZEPAM 5 MG/ML IJ SOLN: 2.0000 mg | Freq: Once | INTRAMUSCULAR | Status: DC

## 2015-05-20 MED ORDER — POTASSIUM CHLORIDE IN NACL 20-0.9 MEQ/L-% IV SOLN
INTRAVENOUS | Status: AC
  Administered 2015-05-20 (×2): via INTRAVENOUS
  Filled 2015-05-20 (×3): qty 1000

## 2015-05-20 MED ORDER — ADULT MULTIVITAMIN W/MINERALS CH
1.0000 | ORAL_TABLET | Freq: Every day | ORAL | Status: DC
  Administered 2015-05-20 – 2015-05-21 (×2): 1 via ORAL
  Filled 2015-05-20 (×2): qty 1

## 2015-05-20 MED ORDER — VANCOMYCIN HCL IN DEXTROSE 750-5 MG/150ML-% IV SOLN
750.0000 mg | Freq: Two times a day (BID) | INTRAVENOUS | Status: DC
  Administered 2015-05-20 – 2015-05-21 (×2): 750 mg via INTRAVENOUS
  Filled 2015-05-20 (×4): qty 150

## 2015-05-20 MED ORDER — FOLIC ACID 1 MG PO TABS
1.0000 mg | ORAL_TABLET | Freq: Every day | ORAL | Status: DC
  Administered 2015-05-20 – 2015-05-21 (×2): 1 mg via ORAL
  Filled 2015-05-20 (×2): qty 1

## 2015-05-20 MED ORDER — PROMETHAZINE HCL 25 MG/ML IJ SOLN
12.5000 mg | Freq: Four times a day (QID) | INTRAMUSCULAR | Status: DC | PRN
  Administered 2015-05-20 – 2015-05-21 (×3): 12.5 mg via INTRAVENOUS
  Filled 2015-05-20 (×3): qty 1

## 2015-05-20 MED ORDER — IBUPROFEN 200 MG PO TABS
400.0000 mg | ORAL_TABLET | Freq: Four times a day (QID) | ORAL | Status: DC | PRN
  Administered 2015-05-20 – 2015-05-21 (×3): 400 mg via ORAL
  Filled 2015-05-20 (×3): qty 2

## 2015-05-20 MED ORDER — BACLOFEN 10 MG PO TABS
5.0000 mg | ORAL_TABLET | Freq: Three times a day (TID) | ORAL | Status: DC
  Administered 2015-05-20: 5 mg via ORAL
  Filled 2015-05-20: qty 1

## 2015-05-20 MED ORDER — LOPERAMIDE HCL 2 MG PO CAPS
2.0000 mg | ORAL_CAPSULE | ORAL | Status: DC | PRN
  Filled 2015-05-20: qty 1

## 2015-05-20 MED ORDER — ONDANSETRON HCL 4 MG/2ML IJ SOLN
4.0000 mg | Freq: Once | INTRAMUSCULAR | Status: AC
  Administered 2015-05-20: 4 mg via INTRAVENOUS
  Filled 2015-05-20: qty 2

## 2015-05-20 MED ORDER — ONDANSETRON HCL 4 MG PO TABS: 4.0000 mg | ORAL_TABLET | Freq: Four times a day (QID) | ORAL | Status: DC | PRN

## 2015-05-20 MED ORDER — ENOXAPARIN SODIUM 40 MG/0.4ML ~~LOC~~ SOLN
40.0000 mg | SUBCUTANEOUS | Status: DC
  Administered 2015-05-20: 40 mg via SUBCUTANEOUS
  Filled 2015-05-20: qty 0.4

## 2015-05-20 MED ORDER — NICOTINE 14 MG/24HR TD PT24
14.0000 mg | MEDICATED_PATCH | Freq: Every day | TRANSDERMAL | Status: DC
  Administered 2015-05-20 – 2015-05-21 (×2): 14 mg via TRANSDERMAL
  Filled 2015-05-20 (×2): qty 1

## 2015-05-20 MED ORDER — DIAZEPAM 5 MG/ML IJ SOLN
2.0000 mg | INTRAMUSCULAR | Status: DC | PRN
  Administered 2015-05-20: 2 mg via INTRAVENOUS
  Filled 2015-05-20: qty 2

## 2015-05-20 MED ORDER — ONDANSETRON HCL 4 MG/2ML IJ SOLN: 4.0000 mg | Freq: Four times a day (QID) | INTRAMUSCULAR | Status: DC | PRN

## 2015-05-20 MED ORDER — VANCOMYCIN HCL IN DEXTROSE 1-5 GM/200ML-% IV SOLN
1000.0000 mg | Freq: Once | INTRAVENOUS | Status: AC
  Administered 2015-05-20: 1000 mg via INTRAVENOUS
  Filled 2015-05-20: qty 200

## 2015-05-20 MED ORDER — BACLOFEN 10 MG PO TABS
10.0000 mg | ORAL_TABLET | Freq: Four times a day (QID) | ORAL | Status: DC
  Administered 2015-05-20 – 2015-05-21 (×2): 10 mg via ORAL
  Filled 2015-05-20 (×2): qty 1

## 2015-05-20 NOTE — Progress Notes (Addendum)
Patient arrived on the floor and upon assessment, he stated that he uses 500 mg of Heroin per day.  He also stated that prior to that, for the past 6 to 7 years he has used opioids. He is currently living in his [name], homeless and has recently attended [facility] [place].  His last use was last night at 8:00 PM and he is now having withdrawals (tremors, sweating, cramping, nausea).  Patient has some scratches on his nose and left arm near his elbow.  Right arm is swollen, pink and tender to touch.   Doctor is aware of his substance abuse and is also aware of his request to leave AMA.  Patient is sleeping and has decided to remain here at this present time.

## 2015-05-20 NOTE — ED Notes (Signed)
The pt wants to get off heroin and crack  Iv usage.  Last iv drugs was last night 2020

## 2015-05-20 NOTE — ED Notes (Signed)
Attempted report 

## 2015-05-20 NOTE — Progress Notes (Signed)
Pt continues to have a heart rate in the low 40's while he is sleeping. When awakened his heart rate increases to low 50's. Internal Medicine MD notified. Pt is asymptomatic. Vital signs are stable. He is alert and oriented. MD stated that he is comfortable with bradycardia as long as pt is not symptomatic.  While checking on pt., his friend was found lying in the bathroom with the shower on. He stated he had not fallen but was lying down because he was sick. Friend was also going through heroin withdrawal. AC, rapid response, and security notified. Friend was taken by wheelchair to ED by rapid response nurse. Security and [employer] police present and they are attempting to find the keys to the patient's van.

## 2015-05-20 NOTE — Progress Notes (Signed)
Went to see pt. He has agreed to stay for now. Complaint of body pains- Abdominal and muscle spasms. He says the valium helps a bit- for about 55mins. We will give a trial of Vistaril and increase baclofen dose.  Brian [name]. IMTS.

## 2015-05-20 NOTE — H&P (Signed)
Date: 05/20/2015               Patient Name:  Brian [name] MRN: [id number]  DOB: [birth year] Age / Sex: 29 y.o., male   PCP: No Pcp Per Patient         Medical Service: Internal Medicine Teaching Service         Attending Physician: Dr. Bartholomew Crews, MD    First Contact: Dr. Maryellen Pile Pager: [phone]  Second Contact: Dr. Jenetta Downer Pager: [phone]       After Hours (After 5p/  First Contact Pager: [phone]  weekends / holidays): Second Contact Pager: [phone]   Chief Complaint: Heroin withdrawl  History of Present Illness: Brian [name] is a 29 year old white male with a PMH of polysubstance abuse presenting to the [facility] ED requesting detox from crack and heroin abuse. He states that approximately 3 weeks ago he lost his job, his wife and is no longer able to see his son and began using heroin and crack. Admits to using opoid pills in the past as well as marijuana and benzos but had never used crack or heroin before 3 weeks ago. He reports that he last used heroin 2 nights ago and was using crack all day yesterday because he could not get any heroin. States that he has not shared needles with anyone but has reused needles on occasion. He reports that he cleaned the needles with alcohol and water before reusing them. He injects into his forearms.   He was noted to have multiple areas of erythema and edema on bilateral forearms in the ED. Worrisome for cellulitis and was started on Vanc. Reports that they have been there for several days and are painful but assumed that it was from missing veins. Reports subjective fever last night and chills. Diffuse HA, nausea, vomiting, and diarrhea. Denies CP, SOB. He is inconsistent with his symptoms and cannot describe his symptoms very well. Initially denied any abdominal pain or dysuria but then reported that he was having abdominal pain and has been having pain and difficulty urinating. Denies any focal weakness, numbness or tingling.    Reports he was at [facility] drug and alcohol treatment center in the past for ETOH abuse. Reports not having any alcohol in the past 60 days since treatment.   Meds: Current Facility-Administered Medications  Medication Dose Route Frequency Provider Last Rate Last Dose  . 0.9 % NaCl with KCl 20 mEq/ L  infusion   Intravenous Continuous Ejiroghene Arlyce Dice, MD 100 mL/hr at 05/20/15 1227    . diazepam (VALIUM) injection 2 mg  2 mg Intravenous Q4H PRN Maryellen Pile, MD   2 mg at 05/20/15 1245  . enoxaparin (LOVENOX) injection 40 mg  40 mg Subcutaneous Q24H Ejiroghene E Emokpae, MD   40 mg at 05/20/15 1231  . folic acid (FOLVITE) tablet 1 mg  1 mg Oral Daily Ejiroghene Arlyce Dice, MD   1 mg at 05/20/15 1231  . ibuprofen (ADVIL,MOTRIN) tablet 400 mg  400 mg Oral Q6H PRN Maryellen Pile, MD   400 mg at 05/20/15 1244  . multivitamin with minerals tablet 1 tablet  1 tablet Oral Daily Ejiroghene Arlyce Dice, MD   1 tablet at 05/20/15 1230  . promethazine (PHENERGAN) injection 12.5 mg  12.5 mg Intravenous Q6H PRN Maryellen Pile, MD      . thiamine (VITAMIN B-1) tablet 100 mg  100 mg Oral Daily Ejiroghene Arlyce Dice, MD   100 mg at  05/20/15 1230    Allergies: Allergies as of 05/20/2015  . (No Known Allergies)   Past Medical History  Diagnosis Date  . Narcotic abuse   . Peptic ulcer   . Renal disorder     kidney stones  . Kidney stones   . Polysubstance abuse   . Cocaine abuse   . Alcohol abuse    Past Surgical History  Procedure Laterality Date  . Hand surgery  2006  . Esophagogastroduodenoscopy N/A 05/29/2014    Procedure: ESOPHAGOGASTRODUODENOSCOPY (EGD) ;  Surgeon: Beryle Beams, MD;  Location: [facility] ENDOSCOPY;  Service: Endoscopy;  Laterality: N/A;  check with Dr. Collene Mares about sedation type/timinng - I recommend MAC   No family history on file. History   Social History  . Marital Status: Single    Spouse Name: N/A  . Number of Children: N/A  . Years of Education: N/A   Occupational  History  . Not on file.   Social History Main Topics  . Smoking status: Current Every Day Smoker -- 0.50 packs/day    Types: Cigarettes  . Smokeless tobacco: Not on file  . Alcohol Use: Yes     Comment: daily  . Drug Use: Yes    Special: Marijuana, Cocaine, IV     Comment: OPIATES  . Sexual Activity: Not on file   Other Topics Concern  . Not on file   Social History Narrative   Lives with a friend   Lost house and custody of son 9broke up with fiancee)   Nature conservation officer    Review of Systems: Review of Systems  Constitutional: Positive for chills.  Eyes: Negative for blurred vision.  Respiratory: Negative for shortness of breath.   Cardiovascular: Negative for chest pain and palpitations.  Gastrointestinal: Positive for nausea, vomiting and diarrhea. Negative for abdominal pain, blood in stool and melena.  Genitourinary: Positive for dysuria. Negative for frequency and flank pain.  Musculoskeletal: Negative for myalgias.  Skin: Negative for rash.  Neurological: Positive for headaches. Negative for dizziness, tingling, focal weakness and weakness.   Physical Exam: Blood pressure 104/58, pulse 92, temperature 98.6 F (37 C), temperature source Oral, resp. rate 18, height 5\' 8"  (1.727 m), weight 63.504 kg (140 lb), SpO2 98 %. GENERAL- alert, co-operative, NAD HEENT- Atraumatic, normocephalic, PERRL, EOMI, oral mucosa appears moist. No carotid bruit, no cervical LN enlargement, thyroid does not appear enlarged, neck supple. CARDIAC- RRR, no murmurs, rubs or gallops. RESP- Moving equal volumes of air, and clear to auscultation bilaterally, no wheezes or crackles. ABDOMEN- Soft, diffusely tender to palpation and voluntary guarding, no rebound, bowel sounds present. BACK- Normal curvature of the spine, No tenderness along the vertebrae, no CVA tenderness. NEURO- No obvious neurologic deficits EXTREMITIES- track marks on bilateral forearms. Two areas of erythema and warmth on  the right forearm, multiple areas of edema in bilateral forearms. No fluctuance.  PSYCH- Normal mood and affect, appropriate thought content and speech.  Lab results: Basic Metabolic Panel:  Recent Labs  05/20/15 0441  NA 136  K 3.7  CL 100*  CO2 28  GLUCOSE 99  BUN 7  CREATININE 1.24  CALCIUM 8.8*   Liver Function Tests:  Recent Labs  05/20/15 0441  AST 26  ALT 23  ALKPHOS 71  BILITOT 0.3  PROT 6.7  ALBUMIN 3.1*   CBC:  Recent Labs  05/20/15 0441  WBC 6.6  HGB 13.5  HCT 41.3  MCV 90.2  PLT 279   Urine Drug Screen: Drugs of  Abuse     Component Value Date/Time   LABOPIA POSITIVE* 05/20/2015 0441   COCAINSCRNUR POSITIVE* 05/20/2015 0441   LABBENZ POSITIVE* 05/20/2015 0441   AMPHETMU NONE DETECTED 05/20/2015 0441   THCU POSITIVE* 05/20/2015 0441   LABBARB NONE DETECTED 05/20/2015 0441    Alcohol Level:  Recent Labs  05/20/15 0441  ETH <5   Imaging results:  No results found.  Assessment & Plan by Problem: Principal Problem:   Cellulitis Active Problems:   Polysubstance abuse   Dysuria   Smoker  Cellulitis: History of IV drug abuse with injections into bilateral forearms. Erythema and warmth with no fluctuance in right forearm, edema and tenderness in bilateral forearms. Will continue Vanc. Afebrile, WBC 6.6. - Telemetry - Continue Vanc per pharmacy, will transition to oral on discharge if no growth on blood cultures - Blood cultures drawn - Will consider bedside US tomorrow if develops worsening symptoms or fluctuance   Polysubstance abuse: Patient reports 3 week history of daily heroin and crack use. Last used heroin 2 days ago. Beginning to have sweats and cramping. Will provide symptomatic treatment. Wanting to go to [facility] where he was in the past for ETOH abuse for rehab.  - HIV, Hepatits screening - Diazepam 2mg  IV q4hr prn withdrawal symptoms - Ibuprofen 400 mg q6hr prn - Phenergan 12.5 mg IV q6hr prn - Immodium 2mg  PO prn  -  Baclofen 5mg  TID - Contacted [facility] for potential drug rehabilitation after discharge, requesting pt information, HPI, progress notes, lab values. Spoke with patient and agreed to release this information to them.   Dysuria: Patient did report dysuria though it is unclear if he is truly having symptoms as he is very inconsistent with his answers and ROS was pan positive. Will check a UA with reflex culture. - UA ordered  Smoker: 1 PPD for 17 years. RN to provide smoking cessation information.  - Nicotine patch 14 mg  Homelessness: Patient reports he has been living out of his car for the past month. - Will Consult SW   DVT PPX: Lovenox 40 mg SQ daily  Code: FULL  Dispo: Disposition is deferred at this time, awaiting improvement of current medical problems. Anticipated discharge in approximately 1-2 day(s).   The patient does not have a current PCP (No Pcp Per Patient) and does need an [facility] hospital follow-up appointment after discharge.  The patient does not have transportation limitations that hinder transportation to clinic appointments.  Signed: Maryellen Pile, MD 05/20/2015, 1:04 PM

## 2015-05-20 NOTE — ED Notes (Addendum)
Pt denies SI/HI. Pt states that he lost his job a few weeks ago, and that he is living in his car. Pt states that he started using heroin after losing his job.

## 2015-05-20 NOTE — Progress Notes (Signed)
ANTIBIOTIC CONSULT NOTE - INITIAL  Pharmacy Consult for Vancomycin Indication: Dental Cellulitis   No Known Allergies  Patient Measurements: Height: 5\' 8"  (172.7 cm) Weight: 140 lb (63.504 kg) IBW/kg (Calculated) : 68.4  Vital Signs: Temp: 98.6 F (37 C) (07/24 1128) Temp Source: Oral (07/24 1128) BP: 104/58 mmHg (07/24 1128) Pulse Rate: 92 (07/24 1128) Intake/Output from previous day:   Intake/Output from this shift:    Labs:  Recent Labs  05/20/15 0441  WBC 6.6  HGB 13.5  PLT 279  CREATININE 1.24   Estimated Creatinine Clearance: 78.2 mL/min (by C-G formula based on Cr of 1.24). No results for input(s): VANCOTROUGH, VANCOPEAK, VANCORANDOM, GENTTROUGH, GENTPEAK, GENTRANDOM, TOBRATROUGH, TOBRAPEAK, TOBRARND, AMIKACINPEAK, AMIKACINTROU, AMIKACIN in the last 72 hours.   Microbiology: No results found for this or any previous visit (from the past 720 hour(s)).  Medical History: Past Medical History  Diagnosis Date  . Narcotic abuse   . Peptic ulcer   . Renal disorder     kidney stones  . Kidney stones   . Polysubstance abuse   . Cocaine abuse   . Alcohol abuse    Assessment: 28 YOM with ongoing left upper molar dental pain x 3 months (broken tooth). Pharmacy to dose vancomycin. SCr 1.24 (baseline 1.1-1.2). WBC 6.6 / afebrile since admission   7/24>>Vanc 1g IV x1 in ED  Goal of Therapy:  vanc trough 10-15  Plan:  -Vancomycin 750mg  IV Q12 hours  -trough levels indicated  -Monitor SCr / WBC / Tmax / LOT -F/U c/s  Brookelyn Gaynor C. Lennox Grumbles, PharmD Pharmacy Resident  Pager: [phone] 05/20/2015 1:26 PM

## 2015-05-20 NOTE — ED Notes (Signed)
Pt.has been called x2 no answer

## 2015-05-20 NOTE — ED Provider Notes (Signed)
CSN: [id number]     Arrival date & time 05/20/15  0430 History   First MD Initiated Contact with Patient 05/20/15 [phone]     Chief Complaint  Patient presents with  . 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 He injects into his forearms. Last injection last night at 8 PM. Reports nausea and diarrhea. Denies any fever, vomiting, chest pain or shortness of breath. Denies any suicidal or homicidal thoughts. He has areas of erythema and warmth to his bilateral forearms where he is injected. Denies any shortness of breath or chest pain. Denies any focal weakness, numbness or tingling.  The history is provided by the patient.    Past Medical History  Diagnosis Date  . Narcotic abuse   . Peptic ulcer   . Renal disorder     kidney stones  . Kidney stones   . Polysubstance abuse   . Cocaine abuse   . Alcohol abuse    Past Surgical History  Procedure Laterality Date  . Hand surgery  2006  . Esophagogastroduodenoscopy N/A 05/29/2014    Procedure: ESOPHAGOGASTRODUODENOSCOPY (EGD) ;  Surgeon: Beryle Beams, MD;  Location[facility];  Service: Endoscopy;  Laterality: N/A;  check with Dr. Collene Mares about sedation type/timinng - I recommend MAC   No family history on file. History  Substance Use Topics  . Smoking status: Current Every Day Smoker -- 0.50 packs/day    Types: Cigarettes  . Smokeless tobacco: Not on file  . Alcohol Use: Yes     Comment: daily    Review of Systems  Constitutional: Negative for fever, activity change and appetite change.  HENT: Negative for ear discharge and rhinorrhea.   Respiratory: Negative for cough, chest tightness and shortness of breath.   Cardiovascular: Negative for chest pain.  Gastrointestinal: Negative for nausea, vomiting and abdominal pain.  Genitourinary: Negative for dysuria and scrotal swelling.   Musculoskeletal: Positive for myalgias and arthralgias. Negative for back pain.  Skin: Positive for wound.  Neurological: Negative for dizziness, weakness and headaches.  A complete 10 system review of systems was obtained and all systems are negative except as noted in the HPI and PMH.      Allergies  Review of patient's allergies indicates no known allergies.  Home Medications   Prior to Admission medications   Medication Sig Start Date End Date Taking? Authorizing Provider  ibuprofen (ADVIL,MOTRIN) 600 MG tablet Take 1 tablet (600 mg total) by mouth every 6 (six) hours as needed. Patient not taking: Reported on 05/20/2015 05/19/15   Julianne Rice, MD  penicillin v potassium (VEETID) 500 MG tablet Take 1 tablet (500 mg total) by mouth 4 (four) times daily. Patient not taking: Reported on 05/20/2015 05/19/15 05/26/15  Julianne Rice, MD   BP 104/58 mmHg  Pulse 92  Temp(Src) 98.6 F (37 C) (Oral)  Resp 18  Ht 5\' 8"  (1.727 m)  Wt 140 lb (63.504 kg)  BMI 21.29 kg/m2  SpO2 98% Physical Exam  Constitutional: He is oriented to person, place, and time. He appears well-developed and well-nourished. No distress.  HENT:  Head: Normocephalic and atraumatic.  Mouth/Throat: Oropharynx is clear and moist. No oropharyngeal exudate.  Eyes: Conjunctivae and EOM are normal. Pupils are equal, round, and reactive to light.  Neck: Normal range of motion. Neck supple.  No meningismus.  Cardiovascular: Normal rate, regular rhythm, normal heart sounds and intact distal pulses.   No murmur heard.  No murmurs  Pulmonary/Chest: Effort normal and breath sounds normal. No respiratory distress.  Abdominal: Soft. There is no tenderness. There is no rebound and no guarding.  Musculoskeletal: Normal range of motion. He exhibits no edema or tenderness.  No midline back pain  Track marks to bilateral forearms. Multiple areas of erythema and edema to right forearm and left forearm consistent with  cellulitis. No fluctuance.  Neurological: He is alert and oriented to person, place, and time. No cranial nerve deficit. He exhibits normal muscle tone. Coordination normal.  No ataxia on finger to nose bilaterally. No pronator drift. 5/5 strength throughout. CN 2-12 intact. Negative Romberg. Equal grip strength. Sensation intact. Gait is normal.   Skin: Skin is warm. There is erythema.  Psychiatric: He has a normal mood and affect. His behavior is normal.  Nursing note and vitals reviewed.   ED Course  Procedures (including critical care time) Labs Review Labs Reviewed  COMPREHENSIVE METABOLIC PANEL - Abnormal; Notable for the following:    Chloride 100 (*)    Calcium 8.8 (*)    Albumin 3.1 (*)    All other components within normal limits  URINE RAPID DRUG SCREEN, HOSP PERFORMED - Abnormal; Notable for the following:    Opiates POSITIVE (*)    Cocaine POSITIVE (*)    Benzodiazepines POSITIVE (*)    Tetrahydrocannabinol POSITIVE (*)    All other components within normal limits  CULTURE, BLOOD (ROUTINE X 2)  CULTURE, BLOOD (ROUTINE X 2)  ETHANOL  CBC  HIV ANTIBODY (ROUTINE TESTING)  HEPATITIS PANEL, ACUTE  URINALYSIS, ROUTINE W REFLEX MICROSCOPIC (NOT AT [facility])  I-STAT CG4 LACTIC ACID, ED    Imaging Review No results found.   EKG Interpretation None      MDM   Final diagnoses:  Cellulitis of forearm  Drug abuse   IV drug abuse requesting detox. No suicidal or homicidal thoughts. Patient with multiple small areas of cellulitis to his bilateral forearms.  IV drug abuser with multiple areas of cellulitis to bilateral forearms. He is afebrile nontoxic appearing. There is no fluctuance or drainable abscess appreciated. No murmurs on exam. Blood cultures obtained.  Given patient's poor social situation and lack of compliance he will benefit from IV antibiotics. Discussed with internal medicine residents who will admit patient.    Ezequiel Essex, MD 05/20/15 [phone]

## 2015-05-21 DIAGNOSIS — F1110 Opioid abuse, uncomplicated: Secondary | ICD-10-CM | POA: Diagnosis not present

## 2015-05-21 DIAGNOSIS — F1910 Other psychoactive substance abuse, uncomplicated: Secondary | ICD-10-CM | POA: Diagnosis not present

## 2015-05-21 DIAGNOSIS — L03113 Cellulitis of right upper limb: Secondary | ICD-10-CM | POA: Diagnosis not present

## 2015-05-21 DIAGNOSIS — B9689 Other specified bacterial agents as the cause of diseases classified elsewhere: Secondary | ICD-10-CM | POA: Diagnosis not present

## 2015-05-21 LAB — HEPATITIS PANEL, ACUTE
Hep A IgM: NEGATIVE
Hep B C IgM: NEGATIVE
Hepatitis B Surface Ag: NEGATIVE

## 2015-05-21 LAB — URINALYSIS, ROUTINE W REFLEX MICROSCOPIC
BILIRUBIN URINE: NEGATIVE
GLUCOSE, UA: NEGATIVE mg/dL
HGB URINE DIPSTICK: NEGATIVE
KETONES UR: NEGATIVE mg/dL
Leukocytes, UA: NEGATIVE
Nitrite: NEGATIVE
Protein, ur: NEGATIVE mg/dL
Specific Gravity, Urine: 1.002 — ABNORMAL LOW (ref 1.005–1.030)
Urobilinogen, UA: 0.2 mg/dL (ref 0.0–1.0)
pH: 7 (ref 5.0–8.0)

## 2015-05-21 LAB — HIV ANTIBODY (ROUTINE TESTING W REFLEX): HIV Screen 4th Generation wRfx: NONREACTIVE

## 2015-05-21 MED ORDER — DIAZEPAM 2 MG PO TABS
2.0000 mg | ORAL_TABLET | ORAL | Status: DC | PRN
  Administered 2015-05-21: 2 mg via ORAL
  Filled 2015-05-21: qty 1

## 2015-05-21 NOTE — Progress Notes (Signed)
Pt is alert and oriented although still experiencing muscle tremors. His heart rate is stable in the mid 50's. His friend was discharged from the ED and is currently in the room with the patient. The [facility] was notified. He is waiting for his aunt to come for him. He is also experiencing withdrawal symptoms.

## 2015-05-21 NOTE — Progress Notes (Signed)
Subjective: Patient reports he "feels like death." Had 2 episodes of vomiting and 2 episodes of diarrhea. Reports muscle and stomach cramps without much relief.   Objective: Vital signs in last 24 hours: Filed Vitals:   05/21/15 0100 05/21/15 0300 05/21/15 0444 05/21/15 0600  BP:   104/59   Pulse: 55 47 45 44  Temp:   97.6 F (36.4 C)   TempSrc:   Oral   Resp:   16   Height:      Weight:      SpO2:   99% 99%   Weight change:   Intake/Output Summary (Last 24 hours) at 05/21/15 1157 Last data filed at 05/21/15 1100  Gross per 24 hour  Intake   1705 ml  Output      0 ml  Net   1705 ml   Physical Exam GENERAL- alert, co-operative, appears as stated age, not in any distress. CARDIAC- RRR, no murmurs, rubs or gallops. RESP- Moving equal volumes of air, and clear to auscultation bilaterally, no wheezes or crackles. ABDOMEN- Soft, nontender, no guarding or rebound, no palpable masses or organomegaly, bowel sounds present. EXTREMITIES- track marks on bilateral forearms. Erythema and warmth on the right forearm has is resolved, multiple areas of edema in bilateral forearms. No fluctuance.  SKIN- Warm, dry, No rash or lesion. PSYCH- Normal mood and affect, appropriate thought content and speech.  Lab Results: Basic Metabolic Panel:  Recent Labs Lab 05/20/15 0441  NA 136  K 3.7  CL 100*  CO2 28  GLUCOSE 99  BUN 7  CREATININE 1.24  CALCIUM 8.8*   Liver Function Tests:  Recent Labs Lab 05/20/15 0441  AST 26  ALT 23  ALKPHOS 71  BILITOT 0.3  PROT 6.7  ALBUMIN 3.1*   CBC:  Recent Labs Lab 05/20/15 0441  WBC 6.6  HGB 13.5  HCT 41.3  MCV 90.2  PLT 279   Urine Drug Screen: Drugs of Abuse     Component Value Date/Time   LABOPIA POSITIVE* 05/20/2015 0441   COCAINSCRNUR POSITIVE* 05/20/2015 0441   LABBENZ POSITIVE* 05/20/2015 0441   AMPHETMU NONE DETECTED 05/20/2015 0441   THCU POSITIVE* 05/20/2015 0441   LABBARB NONE DETECTED 05/20/2015 0441      Alcohol Level:  Recent Labs Lab 05/20/15 0441  ETH <5   Urinalysis:  Recent Labs Lab 05/21/15 0116  COLORURINE YELLOW  LABSPEC 1.002*  PHURINE 7.0  GLUCOSEU NEGATIVE  HGBUR NEGATIVE  BILIRUBINUR NEGATIVE  KETONESUR NEGATIVE  PROTEINUR NEGATIVE  UROBILINOGEN 0.2  NITRITE NEGATIVE  LEUKOCYTESUR NEGATIVE   Micro Results: Recent Results (from the past 240 hour(s))  Blood culture (routine x 2)     Status: None (Preliminary result)   Collection Time: 05/20/15  7:37 AM  Result Value Ref Range Status   Specimen Description BLOOD LEFT ANTECUBITAL  Final   Special Requests BOTTLES DRAWN AEROBIC AND ANAEROBIC 5CC  Final   Culture NO GROWTH 1 DAY  Final   Report Status PENDING  Incomplete  Blood culture (routine x 2)     Status: None (Preliminary result)   Collection Time: 05/20/15  7:52 AM  Result Value Ref Range Status   Specimen Description BLOOD LEFT ANTECUBITAL  Final   Special Requests BOTTLES DRAWN AEROBIC AND ANAEROBIC 5CCS  Final   Culture NO GROWTH 1 DAY  Final   Report Status PENDING  Incomplete   Studies/Results: No results found. Medications: I have reviewed the patient's current medications. Scheduled Meds: . baclofen  10  mg Oral QID  . enoxaparin (LOVENOX) injection  40 mg Subcutaneous Q24H  . folic acid  1 mg Oral Daily  . multivitamin with minerals  1 tablet Oral Daily  . nicotine  14 mg Transdermal Daily  . thiamine  100 mg Oral Daily  . vancomycin  750 mg Intravenous Q12H   Continuous Infusions:  PRN Meds:.diazepam, ibuprofen, loperamide, promethazine Assessment/Plan: Principal Problem:   Cellulitis Active Problems:   Polysubstance abuse   Dysuria   Smoker   Homeless single person   Drug abuse  Cellulitis: Erythema and warmth in right forearm improved. No fluctuance. Edema and tenderness in bilateral forearms. Will continue Vanc. Afebrile, WBC 6.6. Blood cultures showing no growht - Continue Telemetry - Continue Vanc per pharmacy, will  transition to oral on discharge  - Blood cultures NG - Likely D/C to [facility] rehabilitation later today  Polysubstance abuse:  Have sweats, cramping, nausea and diarrhea. Will provide symptomatic treatment. Wanting to go to [facility] where he was in the past for ETOH abuse for rehab. Patient wanted to leave AMA last evening. Convinced him to stay and change some medications to help control his symptoms. - HIV, Hepatits screening pending - Was started on Vistaril 50mg  overnight after wanting to leave AMA last night and stopped Diazepam. Reports Diazepam worked better. Will stop Vistaril and start Diazepam 2mg  IV q3hr prn withdrawal symptoms - Ibuprofen 400 mg q6hr prn - Phenergan 12.5 mg IV q6hr prn - Immodium 2 mg PO prn  - Increase to Baclofen 10 mg TID - Contacted [facility], waiting to hear back from them  Dysuria: Patient did report dysuria though it is unclear if he is truly having symptoms as he is very inconsistent with his answers and ROS was pan positive. - UA clear  Smoker: 1 PPD for 17 years. RN to provide smoking cessation information.  - Nicotine patch 14 mg  Homelessness: Patient reports he has been living out of his car for the past month. - Consulted SW   DVT PPX: Lovenox 40 mg SQ daily  Code: FULL  Dispo: Likely discharge later today  The patient does not have a current PCP (No Pcp Per Patient) and does need an [facility] hospital follow-up appointment after discharge.  The patient does not have transportation limitations that hinder transportation to clinic appointments.   LOS: 1 day   Maryellen Pile, MD 05/21/2015, 11:57 AM

## 2015-05-23 NOTE — Discharge Summary (Signed)
PATIENT LEFT AMA  Name: Brian [name] MRN: [id number] DOB: 01/13/1986 29 y.o. PCP: No Pcp Per Patient  Date of Admission: 05/20/2015  6:05 AM Date of Discharge: 05/23/2015 Attending Physician: No att. providers found  Discharge Diagnosis: Principal Problem:   Cellulitis Active Problems:   Polysubstance abuse   Dysuria   Smoker   Homeless single person   Drug abuse  Discharge Medications:   Medication List    ASK your doctor about these medications        ibuprofen 600 MG tablet  Commonly known as:  ADVIL,MOTRIN  Take 1 tablet (600 mg total) by mouth every 6 (six) hours as needed.     penicillin v potassium 500 MG tablet  Commonly known as:  VEETID  Take 1 tablet (500 mg total) by mouth 4 (four) times daily.        Disposition and follow-up:   [name] left AMA from [facility] in Stable condition.  At the hospital follow up visit please address:  1.  Resolution of cellulitis, drug abuse  2.  Labs / imaging needed at time of follow-up: none  3.  Pending labs/ test needing follow-up: blood cultures  Follow-up Appointments: Patient left AMA   Discharge Instructions: Patient left AMA   Consultations:  None  Procedures Performed:  No results found.  Admission HPI: Mr. 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 He states that approximately 3 weeks ago he lost his job, his wife and is no longer able to see his son and began using heroin and crack. Admits to using opoid pills in the past as well as marijuana and benzos but had never used crack or heroin before 3 weeks ago. He reports that he last used heroin 2 nights ago and was using crack all day yesterday because he could not get any heroin. States that he has not shared needles with anyone but has reused needles on occasion. He reports that he cleaned the needles with alcohol and  water before reusing them. He injects into his forearms.   He was noted to have multiple areas of erythema and edema on bilateral forearms in the ED. Worrisome for cellulitis and was started on Vanc. Reports that they have been there for several days and are painful but assumed that it was from missing veins. Reports subjective fever last night and chills. Diffuse HA, nausea, vomiting, and diarrhea. Denies CP, SOB. He is inconsistent with his symptoms and cannot describe his symptoms very well. Initially denied any abdominal pain or dysuria but then reported that he was having abdominal pain and has been having pain and difficulty urinating. Denies any focal weakness, numbness or tingling.   Reports he was at [facility] drug and alcohol treatment center in the past for ETOH abuse. Reports not having any alcohol in the past 60 days since treatment.   Hospital Course by problem list: Principal Problem:   Cellulitis Active Problems:   Polysubstance abuse   Dysuria   Smoker   Homeless single person   Drug abuse   Cellulitis: History of IV drug abuse with injections into bilateral forearms. Erythema and warmth with no fluctuance in right forearm, edema and tenderness in bilateral forearms. He was started on Vanc in the ED, blood cultures drawn. He was afebrile with a WBC 6.6. With his history, we continued vancomycin with the plan to  transition to PO ABX on discharge. We did not get imagining given lack of fluctuance and low suspicion for any abscesses. The following morning, the erythema was improved though the tenderness persisted. Blood cultures had no growth. Planned for discharge later that day on PO ABX but he left AMA.   Polysubstance abuse: Patient reported 3 week history of daily heroin and crack use. Last used heroin 2 days ago. Began having withdrawal symptoms in the ED, sweating and cramping. We started him on Diazepam 2 mg IV q3hr prn, Ibuprofen 400 mg q6hr prn, Phenergan 12.5 mg IV q6hr prn,  Imodium 2 mg prn, Baclofen 5 mg TID for withdrawal symptoms. His blood pressure was borderline low and was monitored closely. He wanted to leave AMA early that evening but we were able to convince him to stay. He reported that the Diazepam was not working and complaining of severe abdominal cramps. We changed his Diazepam to Vistaril 50 mg and increased the Baclofen to 10 mg TID. The following morning he wanted to switch back to the Diazepam from the Vistaril as he thought it worked better, restarted Diazepam 2 mg q3hr and stopped Vistaril. He had a couple episodes of emesis and diarrhea but controlled well with medications. His BP remained stable. Hepatitis panel and HIV screening were negative. He reported that he was at [facility] in the past for ETOH abuse and wanted to be discharged there. We contacted them and sent his information to them. However, he abruptly left AMA before they were able to get his placement set up.   Dysuria: Patient did report dysuria though it is unclear if he is truly having symptoms as he is very inconsistent with his answers and ROS was pan positive. UA was negative.   Smoker: 1 PPD for 17 years. RN to provided smoking cessation information.   Homelessness: Patient reports he has been living out of his car for the past month. SW was consulted.   Discharge Vitals:   BP 104/59 mmHg  Pulse 44  Temp(Src) 97.6 F (36.4 C) (Oral)  Resp 16  Ht 5\' 8"  (1.727 m)  Wt 63.504 kg (140 lb)  BMI 21.29 kg/m2  SpO2 99%  Discharge Labs:  No results found for this or any previous visit (from the past 24 hour(s)).  Signed: Maryellen Pile, MD 05/23/2015, 7:33 PM

## 2015-05-24 ENCOUNTER — Emergency Department (HOSPITAL_COMMUNITY)
Admission: EM | Admit: 2015-05-24 | Discharge: 2015-05-24 | Disposition: A | Discharge status: Home / Self Care | Attending: Emergency Medicine | Admitting: Emergency Medicine

## 2015-05-24 ENCOUNTER — Encounter (HOSPITAL_COMMUNITY): Payer: Self-pay

## 2015-05-24 DIAGNOSIS — F1910 Other psychoactive substance abuse, uncomplicated: Secondary | ICD-10-CM

## 2015-05-24 DIAGNOSIS — Z8719 Personal history of other diseases of the digestive system: Secondary | ICD-10-CM | POA: Insufficient documentation

## 2015-05-24 DIAGNOSIS — F1410 Cocaine abuse, uncomplicated: Secondary | ICD-10-CM | POA: Insufficient documentation

## 2015-05-24 DIAGNOSIS — F1110 Opioid abuse, uncomplicated: Secondary | ICD-10-CM | POA: Insufficient documentation

## 2015-05-24 DIAGNOSIS — Z720 Tobacco use: Secondary | ICD-10-CM | POA: Insufficient documentation

## 2015-05-24 DIAGNOSIS — Z87442 Personal history of urinary calculi: Secondary | ICD-10-CM | POA: Insufficient documentation

## 2015-05-24 MED ORDER — CHLORDIAZEPOXIDE HCL 25 MG PO CAPS
50.0000 mg | ORAL_CAPSULE | Freq: Once | ORAL | Status: AC
  Administered 2015-05-24: 50 mg via ORAL
  Filled 2015-05-24: qty 2

## 2015-05-24 NOTE — Discharge Instructions (Signed)
Polysubstance Abuse Brian [name], Attached are a list of phone numbers you can call for help in getting detox.  Call tomorrow for help.  If any symptoms worsen, come back to the ED immediately.  Thank you. When people abuse more than one drug or type of drug it is called polysubstance or polydrug abuse. For example, many smokers also drink alcohol. This is one form of polydrug abuse. Polydrug abuse also refers to the use of a drug to counteract an unpleasant effect produced by another drug. It may also be used to help with withdrawal from another drug. People who take stimulants may become agitated. Sometimes this agitation is countered with a tranquilizer. This helps protect against the unpleasant side effects. Polydrug abuse also refers to the use of different drugs at the same time.  Anytime drug use is interfering with normal living activities, it has become abuse. This includes problems with family and friends. Psychological dependence has developed when your mind tells you that the drug is needed. This is usually followed by physical dependence which has developed when continuing increases of drug are required to get the same feeling or "high". This is known as addiction or chemical dependency. A person's risk is much higher if there is a history of chemical dependency in the family. SIGNS OF CHEMICAL DEPENDENCY  You have been told by friends or family that drugs have become a problem.  You fight when using drugs.  You are having blackouts (not remembering what you do while using).  You feel sick from using drugs but continue using.  You lie about use or amounts of drugs (chemicals) used.  You need chemicals to get you going.  You are suffering in work performance or in school because of drug use.  You get sick from use of drugs but continue to use anyway.  You need drugs to relate to people or feel comfortable in social situations.  You use drugs to forget problems. "Yes" answered to  any of the above signs of chemical dependency indicates there are problems. The longer the use of drugs continues, the greater the problems will become. If there is a family history of drug or alcohol use, it is best not to experiment with these drugs. Continual use leads to tolerance. After tolerance develops more of the drug is needed to get the same feeling. This is followed by addiction. With addiction, drugs become the most important part of life. It becomes more important to take drugs than participate in the other usual activities of life. This includes relating to friends and family. Addiction is followed by dependency. Dependency is a condition where drugs are now needed not just to get high, but to feel normal. Addiction cannot be cured but it can be stopped. This often requires outside help and the care of professionals. Treatment centers are listed in the yellow pages under: Cocaine, Narcotics, and Alcoholics Anonymous. Most hospitals and clinics can refer you to a specialized care center. Talk to your caregiver if you need help. Document Released: 06/04/2005 Document Revised: 01/05/2012 Document Reviewed: 10/13/2005 [facility] Patient Information 2015 [place], [place]. This information is not intended to replace advice given to you by your health care provider. Make sure you discuss any questions you have with your health care provider. Substance Abuse Treatment Programs  Intensive Outpatient Programs [facility]     [address], [place]  [phone]       [facility] [place] [address] [place], [place]  [place]     (Inpatient and outpatient)     [address]           [place] [phone] (Suboxone and Methadone)  [place], [place] [address]      [place] [address]  [phone] [place], [place]  [facility] (Outpatient/Inpatient, Chemical)    (insurance only) [phone]             [facility] ([place]) [place], [place]     [facility]     [address]     [place], [place]       [facility] (for caregivers and family) [phone] [address] [place]een [place]. [place], [place]      Residential Treatment Programs [facility]      [address], [place], [place] [address]  [phone]       [facility] [address], [place], [place] [address] [phone]  [facility]        [phone]       [facility] [phone]  [facility] ([place]             [place], [place] or [place], [address] [phone]  [facility]    [address]      [place], [employer]       [facility] [address] [place], [place]  [place]   [address] [place], [place] [address]     [phone]      Admissions: 8am-3pm M-F  [facility] ([facility]) [address] [place], [place]  [facility] Program: Residential Program [phone] Days)   [place], [place] or [phone]     [facility]: [facility] [place], [place] (Walk in Hours over the weekend or by referral)  [facility] [address] [place], [place], [place] [address] [phone]  Crisis Mobile: [facility]:  [phone] (for crisis response 24 hours a day) [facility] Hotline:      [phone] Outpatient Psychiatry and Counseling  [facility]: Mobile Crisis Management 24 hours:   [phone]  [facility] of the [employer] sliding scale fee and walk in schedule: M-F 8am-12pm/1pm-3pm [address]  [place], [place] [address] [place], [place] [address] [place] (Formerly known as  [facility])- new patient walk-in appointments available Monday - Friday 8am -3pm.          [address] [place], [place] [address] [phone] or crisis line- [facility]/ Intensive Outpatient Therapy Program [place], [place] [address] [address]      [phone] [address] [place], [place] [address]                 [place]   [facility] [phone]. [place], [place] [address]   [employer] [facility]          [address] Johnette [facility]  [facility], [place] [address]       [phone]  [facility] [address] [place], [place] [address] [phone]  [facility]    [address] [place], [place] [address]     [place], [place] Joycelyn Man     [place] [address]     [phone]       [facility] [place] [address]  [facility]     [address] [place], [place], [place], [place] [address] [place]     [address]     [place], [place] [address]     [phone]       [facility] [address] [place], [place] [address] [phone] Resources for Temporary Residential Assistance/Crisis [place] [facility]) M-F 8am-3pm   [address] [place], [place] [address]   [phone] Services include: laundry, barbering, support groups, case management, phone  & computer access, showers, AA/NA mtgs, mental health/substance abuse nurse, job skills class, disability information, VA assistance, spiritual classes, etc.   [facility]   [address] [place]     [place]              [employer] (women and children)       [place]. [place], [place] [address] [phone] [email or web address] for application and process Application Required  [facility]   [address]    [place] [address]     [phone]                    [facility] [place], [place] [address] [id number] application appt.) Application Required  [facility] (women only)    [address]     [place],  [address]     [phone]      Intake starts 6pm daily Need valid ID, SSC, & Police report [employer] [address] [place],  [id number] Application Required  [employer] (men only)     [place].      [place],  [place]     [phone]       [facility] [place] (Pregnant women only) [address] [place], [place]  [facility]      [place] [address]i [address].      [place], [place] [address]     [phone]             [facility] [address] [place], [place] 90 day commitment/SA/Application process  [facility](men only)     [address]     [place], [place]       Check-in at [facility] [address] [place], [place] [address] [phone] Men/Women/Women and Children must be there by 7 pm  [place], [place]

## 2015-05-24 NOTE — ED Provider Notes (Signed)
CSN: [id number]     Arrival date & time 05/24/15  0126 History  This chart was scribed for Brian Balls, MD by Chester Holstein, ED Scribe. This patient was seen in room WA09/WA09 and the patient's care was started at 1:55 AM.     Chief Complaint  Patient presents with  . Drug Problem     The history is provided by the patient. No language interpreter was used.   HPI Comments: CAESARE ALBAN is a 29 y.o. male who presents to the Emergency Department for drug problem. He last used  heroin and crack which has recently started to use in the crack this evening around last month. 11:30 PM and cocaine this morning this morning. He states he vomited after using cocaine but it was only episode. Pt notes right arm pain and states he has been using the same needle for 3 days. He denies h/o of abscesses. He denies suicidal and homicidal ideation.  Past Medical History  Diagnosis Date  . Narcotic abuse   . Peptic ulcer   . Renal disorder     kidney stones  . Kidney stones   . Polysubstance abuse   . Cocaine abuse   . Alcohol abuse    Past Surgical History  Procedure Laterality Date  . Hand surgery  2006  . Esophagogastroduodenoscopy N/A 05/29/2014    Procedure: ESOPHAGOGASTRODUODENOSCOPY (EGD) ;  Surgeon: Beryle Beams, MD;  [facility] END[facility];  Service: Endoscopy;  Laterality: N/A;  check with Dr. Collene Mares about sedation type/timinng - I recommend MAC   No family history on file. History  Substance Use Topics  . Smoking status: Current Every Day Smoker -- 0.50 packs/day    Types: Cigarettes  . Smokeless tobacco: Not on file  . Alcohol Use: Yes     Comment: daily    Review of Systems A complete 10 system review of systems was obtained and all systems are negative except as noted in the HPI and PMH.     Allergies  Review of patient's allergies indicates no known allergies.  Home Medications   Prior to Admission medications   Medication Sig Start Date End Date Taking? Authorizing  Provider  ibuprofen (ADVIL,MOTRIN) 600 MG tablet Take 1 tablet (600 mg total) by mouth every 6 (six) hours as needed. Patient not taking: Reported on 05/20/2015 05/19/15   Julianne Rice, MD  penicillin v potassium (VEETID) 500 MG tablet Take 1 tablet (500 mg total) by mouth 4 (four) times daily. Patient not taking: Reported on 05/20/2015 05/19/15 05/26/15  Julianne Rice, MD   BP 107/63 mmHg  Pulse 66  Temp(Src) 98.4 F (36.9 C) (Oral)  Resp 19  SpO2 96% Physical Exam  Constitutional: He is oriented to person, place, and time. Vital signs are normal. He appears well-developed and well-nourished.  Non-toxic appearance. He does not appear ill. No distress.  HENT:  Head: Normocephalic and atraumatic.  Nose: Nose normal.  Mouth/Throat: Oropharynx is clear and moist. No oropharyngeal exudate.  Eyes: Conjunctivae and EOM are normal. Pupils are equal, round, and reactive to light. No scleral icterus.  Neck: Normal range of motion. Neck supple. No tracheal deviation, no edema, no erythema and normal range of motion present. No thyroid mass and no thyromegaly present.  Cardiovascular: Normal rate, regular rhythm, S1 normal, S2 normal, normal heart sounds, intact distal pulses and normal pulses.  Exam reveals no gallop and no friction rub.   No murmur heard. Pulses:      Radial pulses are  2+ on the right side, and 2+ on the left side.       Dorsalis pedis pulses are 2+ on the right side, and 2+ on the left side.  Pulmonary/Chest: Effort normal and breath sounds normal. No respiratory distress. He has no wheezes. He has no rhonchi. He has no rales.  Abdominal: Soft. Normal appearance and bowel sounds are normal. He exhibits no distension, no ascites and no mass. There is no hepatosplenomegaly. There is no tenderness. There is no rebound, no guarding and no CVA tenderness.  Musculoskeletal: Normal range of motion. He exhibits no edema or tenderness.  Track marks right upper extremity  Lymphadenopathy:     He has no cervical adenopathy.  Neurological: He is alert and oriented to person, place, and time. He has normal strength. No cranial nerve deficit or sensory deficit.  Skin: Skin is warm, dry and intact. No petechiae and no rash noted. He is not diaphoretic. No erythema. No pallor.  Psychiatric: He has a normal mood and affect. His behavior is normal. Judgment normal.  Nursing note and vitals reviewed.   ED Course  Procedures (including critical care time) DIAGNOSTIC STUDIES: Oxygen Saturation is 96% on room air, normal by my interpretation.    COORDINATION OF CARE: 2:01 AM Discussed treatment plan with patient at beside, the patient agrees with the plan and has no further questions at this time.   Labs Review Labs Reviewed - No data to display  Imaging Review No results found.   EKG Interpretation None      MDM   Final diagnoses:  None   Patient presents to the ED for polysubstance abuse and need for detox.  He currently denies being in a withdrawal state.  He denies SI/HI.  TTS consultation is not currently warranted.  He was given outpatient resources and advised to follow up tomorrow for help.  He otherwise appears well and in NAD.  His VS remain within his normal limits and he is safe for DC.  I personally performed the services described in this documentation, which was scribed in my presence. The recorded information has been reviewed and is accurate.     Brian Balls, MD 05/24/15 [phone]

## 2015-05-24 NOTE — ED Notes (Signed)
Patient states, "I want detox."  States he's been on a drug binge for the past month.  Drugs of choice are heroin and crack.  Also uses ETOH daily and marijuana occasionally.  Reports using heroin all day yesterday and crack today, both intravenously.

## 2015-05-25 ENCOUNTER — Emergency Department
Admission: EM | Admit: 2015-05-25 | Discharge: 2015-05-25 | Disposition: A | Discharge status: Rehabilitation Facility | Payer: Self-pay | Attending: Emergency Medicine | Admitting: Emergency Medicine

## 2015-05-25 DIAGNOSIS — Z720 Tobacco use: Secondary | ICD-10-CM | POA: Insufficient documentation

## 2015-05-25 DIAGNOSIS — F1310 Sedative, hypnotic or anxiolytic abuse, uncomplicated: Secondary | ICD-10-CM | POA: Insufficient documentation

## 2015-05-25 DIAGNOSIS — F1490 Cocaine use, unspecified, uncomplicated: Secondary | ICD-10-CM

## 2015-05-25 DIAGNOSIS — F1110 Opioid abuse, uncomplicated: Secondary | ICD-10-CM | POA: Insufficient documentation

## 2015-05-25 DIAGNOSIS — F1410 Cocaine abuse, uncomplicated: Secondary | ICD-10-CM | POA: Insufficient documentation

## 2015-05-25 DIAGNOSIS — F1210 Cannabis abuse, uncomplicated: Secondary | ICD-10-CM | POA: Insufficient documentation

## 2015-05-25 DIAGNOSIS — R109 Unspecified abdominal pain: Secondary | ICD-10-CM | POA: Insufficient documentation

## 2015-05-25 DIAGNOSIS — F1910 Other psychoactive substance abuse, uncomplicated: Secondary | ICD-10-CM

## 2015-05-25 LAB — CBC
HCT: 44.7 % (ref 40.0–52.0)
HEMOGLOBIN: 14.6 g/dL (ref 13.0–18.0)
MCH: 29 pg (ref 26.0–34.0)
MCHC: 32.6 g/dL (ref 32.0–36.0)
MCV: 89 fL (ref 80.0–100.0)
PLATELETS: 338 10*3/uL (ref 150–440)
RBC: 5.03 MIL/uL (ref 4.40–5.90)
RDW: 13.4 % (ref 11.5–14.5)
WBC: 8.4 10*3/uL (ref 3.8–10.6)

## 2015-05-25 LAB — URINE DRUG SCREEN, QUALITATIVE (ARMC ONLY)
AMPHETAMINES, UR SCREEN: NOT DETECTED
BENZODIAZEPINE, UR SCRN: POSITIVE — AB
Barbiturates, Ur Screen: NOT DETECTED
CANNABINOID 50 NG, UR ~~LOC~~: POSITIVE — AB
Cocaine Metabolite,Ur ~~LOC~~: POSITIVE — AB
MDMA (ECSTASY) UR SCREEN: NOT DETECTED
Methadone Scn, Ur: NOT DETECTED
Opiate, Ur Screen: POSITIVE — AB
Phencyclidine (PCP) Ur S: NOT DETECTED
Tricyclic, Ur Screen: NOT DETECTED

## 2015-05-25 LAB — COMPREHENSIVE METABOLIC PANEL
ALT: 17 U/L (ref 17–63)
AST: 18 U/L (ref 15–41)
Albumin: 3.9 g/dL (ref 3.5–5.0)
Alkaline Phosphatase: 64 U/L (ref 38–126)
Anion gap: 10 (ref 5–15)
BUN: 16 mg/dL (ref 6–20)
CALCIUM: 9.2 mg/dL (ref 8.9–10.3)
CO2: 25 mmol/L (ref 22–32)
Chloride: 103 mmol/L (ref 101–111)
Creatinine, Ser: 1.35 mg/dL — ABNORMAL HIGH (ref 0.61–1.24)
GFR calc Af Amer: >60 mL/min (ref >60)
GFR calc non Af Amer: >60 mL/min (ref >60)
GLUCOSE: 119 mg/dL — AB (ref 65–99)
POTASSIUM: 4.5 mmol/L (ref 3.5–5.1)
SODIUM: 138 mmol/L (ref 135–145)
Total Bilirubin: 0.1 mg/dL — ABNORMAL LOW (ref 0.3–1.2)
Total Protein: 8.3 g/dL — ABNORMAL HIGH (ref 6.5–8.1)

## 2015-05-25 LAB — ETHANOL: Alcohol, Ethyl (B): <5 mg/dL (ref <5)

## 2015-05-25 LAB — CULTURE, BLOOD (ROUTINE X 2)
Culture: NO GROWTH
Culture: NO GROWTH

## 2015-05-25 MED ORDER — IBUPROFEN 800 MG PO TABS
800.0000 mg | ORAL_TABLET | Freq: Once | ORAL | Status: AC
  Administered 2015-05-25: 800 mg via ORAL

## 2015-05-25 MED ORDER — ONDANSETRON 4 MG PO TBDP
4.0000 mg | ORAL_TABLET | Freq: Once | ORAL | Status: AC
  Administered 2015-05-25: 4 mg via ORAL

## 2015-05-25 MED ORDER — CEPHALEXIN 500 MG PO CAPS: 500.0000 mg | ORAL_CAPSULE | Freq: Four times a day (QID) | ORAL | Status: DC

## 2015-05-25 MED ORDER — IBUPROFEN 800 MG PO TABS
ORAL_TABLET | ORAL | Status: AC
  Administered 2015-05-25: 800 mg via ORAL
  Filled 2015-05-25: qty 1

## 2015-05-25 MED ORDER — CEPHALEXIN 500 MG PO CAPS
500.0000 mg | ORAL_CAPSULE | Freq: Once | ORAL | Status: AC
  Administered 2015-05-25: 500 mg via ORAL
  Filled 2015-05-25: qty 1

## 2015-05-25 MED ORDER — CLONIDINE HCL 0.1 MG PO TABS
ORAL_TABLET | ORAL | Status: AC
  Administered 2015-05-25: 0.1 mg via ORAL
  Filled 2015-05-25: qty 1

## 2015-05-25 MED ORDER — ONDANSETRON 4 MG PO TBDP
ORAL_TABLET | ORAL | Status: AC
  Administered 2015-05-25: 4 mg via ORAL
  Filled 2015-05-25: qty 1

## 2015-05-25 MED ORDER — CLONIDINE HCL 0.1 MG PO TABS
0.1000 mg | ORAL_TABLET | Freq: Once | ORAL | Status: AC
  Administered 2015-05-25: 0.1 mg via ORAL

## 2015-05-25 NOTE — ED Notes (Addendum)
Pt sts he came to ED for detox of heroin, cocaine and opiates.  Pt sts he last used heroin yesterday and cocaince 30 min prior to arrival.  Pt sts he injects. Pt sts [facility].  Pt deines SI/HI

## 2015-05-25 NOTE — ED Notes (Signed)
BEHAVIORAL HEALTH ROUNDING Patient sleeping: Yes.   Patient alert and oriented: yes Behavior appropriate: Yes.  ;  Nutrition and fluids offered: Yes  Toileting and hygiene offered: Yes  Sitter present: yes Law enforcement present: Yes  

## 2015-05-25 NOTE — Discharge Instructions (Signed)
Directly to [facility] for ongoing treatment. Take Keflex due to concerns for infection from your IV drug abuse. Return to the emergency department if you have fevers, if you have abscesses develop, or if you have other urgent concerns.  Opioid Withdrawal Opioids are a group of narcotic drugs. They include the street drug heroin. They also include pain medicines, such as morphine, hydrocodone, oxycodone, and fentanyl. Opioid withdrawal is a group of characteristic physical and mental signs and symptoms. It typically occurs if you have been using opioids daily for several weeks or longer and stop using or rapidly decrease use. Opioid withdrawal can also occur if you have used opioids daily for a long time and are given a medicine to block the effect.  SIGNS AND SYMPTOMS Opioid withdrawal includes three or more of the following symptoms:   Depressed, anxious, or irritable mood.  Nausea or vomiting.  Muscle aches or spasms.   Watery eyes.   Runny nose.  Dilated pupils, sweating, or hairs standing on end.  Diarrhea or intestinal cramping.  Yawning.   Fever.  Increased blood pressure.  Fast pulse.  Restlessness or trouble sleeping. These signs and symptoms occur within several hours of stopping or reducing short-acting opioids, such as heroin. They can occur within 3 days of stopping or reducing long-acting opioids, such as methadone. Withdrawal begins within minutes of receiving a drug that blocks the effects of opioids, such as naltrexone or naloxone. DIAGNOSIS  Opioid use disorder is diagnosed by your health care provider. You will be asked about your symptoms, drug and alcohol use, medical history, and use of medicines. A physical exam may be done. Lab tests may be ordered. Your health care provider may have you see a mental health professional.  TREATMENT  The treatment for opioid withdrawal is usually provided by medical doctors with special training in substance use disorders  (addiction specialists). The following medicines may be included in treatment:  Opioids given in place of the abused opioid. They turn on opioid receptors in the brain and lessen or prevent withdrawal symptoms. They are gradually decreased (opioid substitution and taper).  Non-opioids that can lessen certain opioid withdrawal symptoms. They may be used alone or with opioid substitution and taper. Successful long-term recovery usually requires medicine, counseling, and group support. HOME CARE INSTRUCTIONS   Take medicines only as directed by your health care provider.  Check with your health care provider before starting new medicines.  Keep all follow-up visits as directed by your health care provider. SEEK MEDICAL CARE IF:  You are not able to take your medicines as directed.  Your symptoms get worse.  You relapse. SEEK IMMEDIATE MEDICAL CARE IF:  You have serious thoughts about hurting yourself or others.  You have a seizure.  You lose consciousness. Document Released: 10/16/2003 Document Revised: 02/27/2014 Document Reviewed: 10/26/2013 [facility] Patient Information 2015 [place], [place]. This information is not intended to replace advice given to you by your health care provider. Make sure you discuss any questions you have with your health care provider.

## 2015-05-25 NOTE — ED Notes (Signed)
BEHAVIORAL HEALTH ROUNDING Patient sleeping: No. Patient alert and oriented: yes Behavior appropriate: Yes.  ; If no, describe:  Nutrition and fluids offered: Yes  Toileting and hygiene offered: Yes  Sitter present: yes Law enforcement present: Yes  

## 2015-05-25 NOTE — ED Notes (Signed)
BEHAVIORAL HEALTH ROUNDING Patient sleeping: Yes.   Patient alert and oriented: not applicable Behavior appropriate: Yes.  ; If no, describe:  Nutrition and fluids offered: Yes  Toileting and hygiene offered: Yes  Sitter present: yes Law enforcement present: Yes  

## 2015-05-25 NOTE — ED Provider Notes (Signed)
[facility] Emergency Department Provider Note  ____________________________________________  Time seen: 1320  I have reviewed the triage vital signs and the nursing notes.   HISTORY  Chief Complaint Withdrawal Polysubstance abuse, intravenous, heroin and crack    HPI Brian [name] is a 29 y.o. male who is been abusing heroin for a month to month and a half and is also using crack cocaine. Both of these substances he injects. He reports mixing the crack cocaine with Kool-Aid to dissolve it and then injecting that fluid. His injection turn to the right arm and he is concerned because he has multiple hard areas where he is injected.  His last heroin use was last night around 5 PM. His last injection of crack cocaine was 30 minutes prior to arrival.  He has been seen at [facility] recently. I have reviewed the documentation from those visits. After evaluation, he has been discharged to pursue outpatient detox. He now presents to the emergency department at [facility] with the hope of further care to detox. He tells me that once he detoxes, he may go to a residential program in [place] that last 18 months.  He does report some nausea. He does have some abdominal cramping. He denies any fever.  He was living with his parents, but they have checked him out once he found out the extent to his drug abuse problem. He has been sleeping in his [name] for the past few weeks.   Past Medical History  Diagnosis Date  . Narcotic abuse   . Peptic ulcer   . Renal disorder     kidney stones  . Kidney stones   . Polysubstance abuse   . Cocaine abuse   . Alcohol abuse     Patient Active Problem List   Diagnosis Date Noted  . Cellulitis 05/20/2015  . Dysuria 05/20/2015  . Smoker 05/20/2015  . Homeless single person 05/20/2015  . Drug abuse   . Uncomplicated opioid dependence 03/08/2015  . Ileus of unspecified type 05/28/2014  . Abdominal pain 05/28/2014   . AKI (acute kidney injury) 05/28/2014  . Esophagitis, acute 05/28/2014  . Polysubstance abuse 05/28/2014  . Hematemesis 05/28/2014    Past Surgical History  Procedure Laterality Date  . Hand surgery  2006  . Esophagogastroduodenoscopy N/A 05/29/2014    Procedure: ESOPHAGOGASTRODUODENOSCOPY (EGD) ;  Surgeon: Beryle Beams, MD;  Location: [facility] ENDOSCOPY;  Service: Endoscopy;  Laterality: N/A;  check with Dr. Collene Mares about sedation type/timinng - I recommend MAC    Current Outpatient Rx  Name  Route  Sig  Dispense  Refill  . cephALEXin (KEFLEX) 500 MG capsule   Oral   Take 1 capsule (500 mg total) by mouth 4 (four) times daily.   28 capsule   0   . ibuprofen (ADVIL,MOTRIN) 600 MG tablet   Oral   Take 1 tablet (600 mg total) by mouth every 6 (six) hours as needed. Patient not taking: Reported on 05/20/2015   30 tablet   0   . penicillin v potassium (VEETID) 500 MG tablet   Oral   Take 1 tablet (500 mg total) by mouth 4 (four) times daily. Patient not taking: Reported on 05/20/2015   40 tablet   0     Allergies Review of patient's allergies indicates no known allergies.  History reviewed. No pertinent family history.  Social History History  Substance Use Topics  . Smoking status: Current Every Day Smoker -- 0.50 packs/day  Types: Cigarettes  . Smokeless tobacco: Not on file  . Alcohol Use: Yes     Comment: daily    Review of Systems  Constitutional: Negative for fever. ENT: Negative for sore throat. Cardiovascular: Negative for chest pain. Respiratory: Negative for shortness of breath. Gastrointestinal: Patient is having some nausea and abdominal pain. Genitourinary: Negative for dysuria. Musculoskeletal: No myalgias or injuries. Skin: Negative for rash. There are hard areas, sequela of IV drug abuse, on his right arm. Neurological: Negative for headaches Psychological: Polysubstance abuse. See history of present illness  10-point ROS otherwise  negative.  ____________________________________________   PHYSICAL EXAM:  VITAL SIGNS: ED Triage Vitals  Enc Vitals Group     BP 05/25/15 1258 118/65 mmHg     Pulse Rate 05/25/15 1258 112     Resp 05/25/15 1258 16     Temp 05/25/15 1258 98.7 F (37.1 C)     Temp Source 05/25/15 1258 Oral     SpO2 05/25/15 1258 98 %     Weight 05/25/15 1258 140 lb (63.504 kg)     Height 05/25/15 1258 5\' 8"  (1.727 m)     Head Cir --      Peak Flow --      Pain Score 05/25/15 1259 7     Pain Loc --      Pain Edu? --      Excl. in [place]? --     Constitutional:  Alert and oriented. No distress. ENT   Head: Normocephalic and atraumatic.   Nose: No congestion/rhinnorhea.   Mouth/Throat: Mucous membranes are moist. Cardiovascular: Normal rate, regular rhythm, no murmur noted Respiratory:  Normal respiratory effort, no tachypnea.    Breath sounds are clear and equal bilaterally.  Gastrointestinal: Soft and nontender. No distention.  Back: No muscle spasm, no tenderness, no CVA tenderness. Musculoskeletal: No deformity noted. Nontender with normal range of motion in all extremities.  No noted edema. Neurologic:  Normal speech and language. No gross focal neurologic deficits are appreciated.  Skin:  There are firm areas on the patient's right arm where he has injected. There is no erythema or warmth.. Psychiatric: Mood and affect are normal. Speech and behavior are normal.  ____________________________________________    LABS (pertinent positives/negatives)  Labs Reviewed  COMPREHENSIVE METABOLIC PANEL - Abnormal; Notable for the following:    Glucose, Bld 119 (*)    Creatinine, Ser 1.35 (*)    Total Protein 8.3 (*)    Total Bilirubin 0.1 (*)    All other components within normal limits  URINE DRUG SCREEN, QUALITATIVE ([facility] ONLY) - Abnormal; Notable for the following:    Cocaine Metabolite,Ur [place] POSITIVE (*)    Opiate, Ur Screen POSITIVE (*)    Cannabinoid 50 Ng, Ur  POSITIVE (*)     Benzodiazepine, Ur Scrn POSITIVE (*)    All other components within normal limits  ETHANOL  CBC     ____________________________________________   INITIAL IMPRESSION / ASSESSMENT AND PLAN / ED COURSE  Pertinent labs & imaging results that were available during my care of the patient were reviewed by me and considered in my medical decision making (see chart for details).  29 year old male with polysubstance abuse. He reports some nausea and some symptoms of withdrawal. We are giving him Zofran and clonidine. I have spoken with Kerry Dory of behavioral medicine intake to see the patient and help recommend a resource for further treatment.  ----------------------------------------- 3:06 PM on 05/25/2015 -----------------------------------------  Kerry Dory has discussed this patient's situation with  [facility]. We expect that they will accept him. At this time I will sign out to Dr. Cinda Quest. I will prescribe Keflex for this patient due to his high-risk for cellulitis or other infection from his IV drug abuse.  ____________________________________________   FINAL CLINICAL IMPRESSION(S) / ED DIAGNOSES  Final diagnoses:  Polysubstance abuse  Heroin abuse  Crack cocaine use  IV drug abuse      Ahmed Prima, MD 05/25/15 1512

## 2015-05-25 NOTE — ED Notes (Signed)
BEHAVIORAL HEALTH ROUNDING Patient sleeping: No. Patient alert and oriented: yes Behavior appropriate: Yes.  ; If no, describe:  Nutrition and fluids offered: Yes  Toileting and hygiene offered: Yes  Sitter present: yes Law enforcement present: Yes   ENVIRONMENTAL ASSESSMENT Potentially harmful objects out of patient reach: yes.  Personal belongings secured: Yes.   Patient dressed in hospital provided attire only: Yes.   Plastic bags out of patient reach: Yes.   Patient care equipment (cords, cables, call bells, lines, and drains) shortened, removed, or accounted for: Yes.   Equipment and supplies removed from bottom of stretcher: Yes.   Potentially toxic materials out of patient reach: Yes.   Sharps container removed or out of patient reach: Yes.

## 2015-05-25 NOTE — BH Assessment (Signed)
Assessment Note  Brian [name] is an 28 y.o. male who presents to the ER seeking assistance with detox for his Heroin, Cocaine & Roxicodone. He states has only used for approximately 2 months. It's now at the point of his life being out of control. He denies having any formal treatment, outpatient or inpatient.  Patient symptoms of withdrawal are; nausea, stomach aches, headache, sweats, cold chills and cramps. He denies history of seizures and blackouts. He also denies a history of medical problems and concerns.  He states he has some involvement with the legal system. He has a DWLR an a DWI.   Patient denies SI/HI and AV/H. He endorse some symptoms of depression but it is related to his substance use.  Axis I: Substance Abuse Axis III:  Past Medical History  Diagnosis Date  . Narcotic abuse   . Peptic ulcer   . Renal disorder     kidney stones  . Kidney stones   . Polysubstance abuse   . Cocaine abuse   . Alcohol abuse    Axis IV: economic problems, housing problems, occupational problems, other psychosocial or environmental problems, problems with access to health care services and problems with primary support group  Past Medical History:  Past Medical History  Diagnosis Date  . Narcotic abuse   . Peptic ulcer   . Renal disorder     kidney stones  . Kidney stones   . Polysubstance abuse   . Cocaine abuse   . Alcohol abuse     Past Surgical History  Procedure Laterality Date  . Hand surgery  2006  . Esophagogastroduodenoscopy N/A 05/29/2014    Procedure: ESOPHAGOGASTRODUODENOSCOPY (EGD) ;  Surgeon: Beryle Beams, MD;  Location: [facility] ENDOSCOPY;  Service: Endoscopy;  Laterality: N/A;  check with Dr. Collene Mares about sedation type/timinng - I recommend MAC    Family History: History reviewed. No pertinent family history.  Social History:  reports that he has been smoking Cigarettes.  He has been smoking about 0.50 packs per day. He does not have any smokeless tobacco history on  file. He reports that he drinks alcohol. He reports that he uses illicit drugs (Marijuana, Cocaine, and IV).  Additional Social History:  Alcohol / Drug Use Pain Medications: Roxicodone   Prescriptions: None Reported Over the Counter: None Reported History of alcohol / drug use?: Yes Longest period of sobriety (when/how long): None since he starting using Withdrawal Symptoms: Sweats, Nausea / Vomiting, Fever / Chills, Tremors, Other (Comment) Substance #1 Name of Substance 1: Heroin 1 - Age of First Use: 28 1 - Amount (size/oz): 1/2 to 1 gram 1 - Frequency: Daily 1 - Duration: Approximately a month 1 - Last Use / Amount: 05/24/2015 Substance #2 Name of Substance 2: Cocaine 2 - Age of First Use: Two months 2 - Amount (size/oz): $60 2 - Frequency: Daily 2 - Duration: Two months 2 - Last Use / Amount: 05/25/2015 Substance #3 Name of Substance 3: Roxicodone 3 - Age of First Use: 2006 3 - Amount (size/oz): 60mg  3 - Frequency: 2x to 3x 3 - Duration: 10 years 3 - Last Use / Amount: 05/25/2015  CIWA: CIWA-Ar BP: 118/65 mmHg Pulse Rate: (!) 112 COWS:    Allergies: No Known Allergies  Home Medications:  (Not in a hospital admission)  OB/GYN Status:  No LMP for male patient.  General Assessment Data Location of Assessment: [facility] ED TTS Assessment: In system Is this a Tele or Face-to-Face Assessment?: Face-to-Face Is this an  Initial Assessment or a Re-assessment for this encounter?: Initial Assessment Marital status: Single Maiden name: n/a Is patient pregnant?: No Pregnancy Status: No Living Arrangements: Non-relatives/Friends Can pt return to current living arrangement?: No Admission Status: Voluntary Is patient capable of signing voluntary admission?: No Referral Source: Self/Family/Friend Insurance type: n/a  Medical Screening Exam ([place]) Medical Exam completed: Yes Reason for MSE not completed: Other: (n/a)  Crisis Care Plan Living Arrangements:  Non-relatives/Friends Name of Psychiatrist: None Name of Therapist: n/a  Education Status Is patient currently in school?: No Current Grade: n/a Highest grade of school patient has completed: 12th Name of school: n/a Contact person: n/a  Risk to self with the past 6 months Suicidal Ideation: No Has patient been a risk to self within the past 6 months prior to admission? : No Suicidal Intent: No Has patient had any suicidal intent within the past 6 months prior to admission? : No Is patient at risk for suicide?: No Suicidal Plan?: No Has patient had any suicidal plan within the past 6 months prior to admission? : No Access to Means: No What has been your use of drugs/alcohol within the last 12 months?: Cocaine, Heroin & Pain Medications Previous Attempts/Gestures: No How many times?: 0 Other Self Harm Risks: None Reported Triggers for Past Attempts: None known Intentional Self Injurious Behavior: None Family Suicide History: Unknown Recent stressful life event(s): Other (Comment) (Active Addiction) Persecutory voices/beliefs?: No Depression: No Depression Symptoms: Feeling angry/irritable, Feeling worthless/self pity, Loss of interest in usual pleasures Substance abuse history and/or treatment for substance abuse?: Yes (Cocaine, Heroin & Pain Medications ) Suicide prevention information given to non-admitted patients: Not applicable  Risk to Others within the past 6 months Homicidal Ideation: No Does patient have any lifetime risk of violence toward others beyond the six months prior to admission? : No Thoughts of Harm to Others: No Current Homicidal Intent: No Current Homicidal Plan: No Access to Homicidal Means: No Identified Victim: None Reported History of harm to others?: No Assessment of Violence: None Noted Violent Behavior Description: None Reported Does patient have access to weapons?: No Criminal Charges Pending?: No Does patient have a court date: No Is  patient on probation?: No  Psychosis Hallucinations: None noted Delusions: None noted  Mental Status Report Appearance/Hygiene: In scrubs, In hospital gown Eye Contact: Good Motor Activity: Freedom of movement Speech: Unremarkable, Logical/coherent Level of Consciousness: Alert Mood: Anxious Affect: Appropriate to circumstance Anxiety Level: Minimal Thought Processes: Coherent, Relevant Judgement: Unimpaired Orientation: Person, Time, Place, Situation, Appropriate for developmental age Obsessive Compulsive Thoughts/Behaviors: None  Cognitive Functioning Concentration: Normal Memory: Recent Intact, Remote Intact IQ: Average Insight: Good Impulse Control: Good Appetite: Good Weight Loss: 0 Weight Gain: 0 Sleep: No Change Total Hours of Sleep: 8 Vegetative Symptoms: None  ADLScreening [facility] Assessment Services) Patient's cognitive ability adequate to safely complete daily activities?: Yes Patient able to express need for assistance with ADLs?: Yes Independently performs ADLs?: Yes (appropriate for developmental age)  Prior Inpatient Therapy Prior Inpatient Therapy: No Prior Therapy Dates: n/a Prior Therapy Facilty/Provider(s): n/a Reason for Treatment: n/a  Prior Outpatient Therapy Prior Outpatient Therapy: No Prior Therapy Dates: n/a Prior Therapy Facilty/Provider(s): n/a Reason for Treatment: n/a Does patient have an ACCT team?: No Does patient have Intensive In-House Services?  : No Does patient have [facility] services? : No Does patient have P4CC services?: No  ADL Screening (condition at time of admission) Patient's cognitive ability adequate to safely complete daily activities?: Yes Patient able to express  need for assistance with ADLs?: Yes Independently performs ADLs?: Yes (appropriate for developmental age)       Abuse/Neglect Assessment (Assessment to be complete while patient is alone) Physical Abuse: Denies Verbal Abuse: Denies Sexual Abuse:  Denies Exploitation of patient/patient's resources: Denies Self-Neglect: Denies Values / Beliefs Cultural Requests During Hospitalization: None Spiritual Requests During Hospitalization: None Consults Spiritual Care Consult Needed: No Social Work Consult Needed: No Regulatory affairs officer (For Healthcare) Does patient have an advance directive?: No Would patient like information on creating an advanced directive?: No - patient declined information    Additional Information CIRT Risk: No Elopement Risk: No Does patient have medical clearance?: Yes  Child/Adolescent Assessment Running Away Risk: Denies (Patient is an adult)  Disposition:  Disposition Initial Assessment Completed for this Encounter: Yes Disposition of Patient: Referred to Patient referred to: [facility]  On Site Evaluation by:   Reviewed with Physician:    Gunnar Fusi, MS, LCAS, [place], [place], CCSI 05/25/2015 6:10 PM

## 2015-05-25 NOTE — BHH Counselor (Signed)
Information sent [facility] and patient he is accepted. He will be picked up at approximately 7:30p. That is when transportation will be available. Information forwarded to patient nurse Earley Favor, RN).

## 2015-05-30 ENCOUNTER — Ambulatory Visit (HOSPITAL_COMMUNITY)
Admission: RE | Admit: 2015-05-30 | Discharge: 2015-05-30 | Disposition: A | Discharge status: Home / Self Care | Payer: Self-pay | Source: Ambulatory Visit | Attending: Internal Medicine | Admitting: Internal Medicine

## 2015-05-30 ENCOUNTER — Other Ambulatory Visit (HOSPITAL_COMMUNITY): Payer: Self-pay | Admitting: Internal Medicine

## 2015-05-30 DIAGNOSIS — R1031 Right lower quadrant pain: Secondary | ICD-10-CM | POA: Insufficient documentation

## 2015-05-30 DIAGNOSIS — N200 Calculus of kidney: Secondary | ICD-10-CM | POA: Insufficient documentation

## 2015-05-30 DIAGNOSIS — R109 Unspecified abdominal pain: Secondary | ICD-10-CM

## 2015-05-30 MED ORDER — IOHEXOL 300 MG/ML  SOLN
100.0000 mL | Freq: Once | INTRAMUSCULAR | Status: AC | PRN
  Administered 2015-05-30: 100 mL via INTRAVENOUS

## 2017-03-13 IMAGING — CT CT ABD-PELV W/O CM
2 of 4 series · 16 of 46 positions shown, 18 images · non-contrast
Comparison: CT of the abdomen and pelvis from 03/04/2015

CLINICAL DATA: Chronic hematochezia and sensation of lump about the
anorectal canal. Bilateral flank and groin pain. Initial encounter.

EXAM:
CT ABDOMEN AND PELVIS WITHOUT CONTRAST
TECHNIQUE: Multidetector CT imaging of the abdomen and pelvis was performed
following the standard protocol without IV contrast.

[Series 2: abd/ pelvis 5.0 i30f 1 · axial · 0.67mm/px · z∈[-1063,-688]mm · 13 of 83 slices shown, 15 images]
[im 4/83  soft-tissue]
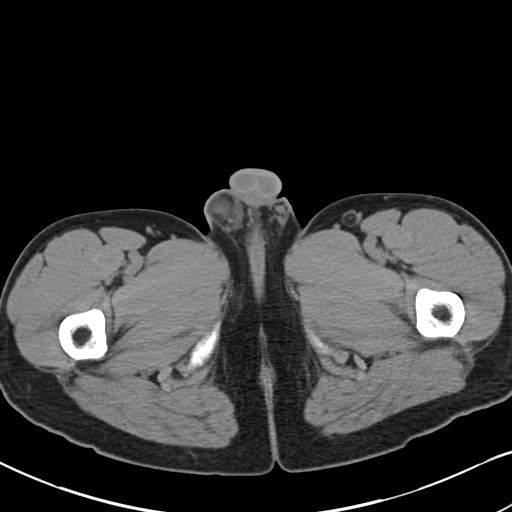
[im 4/83  bone]
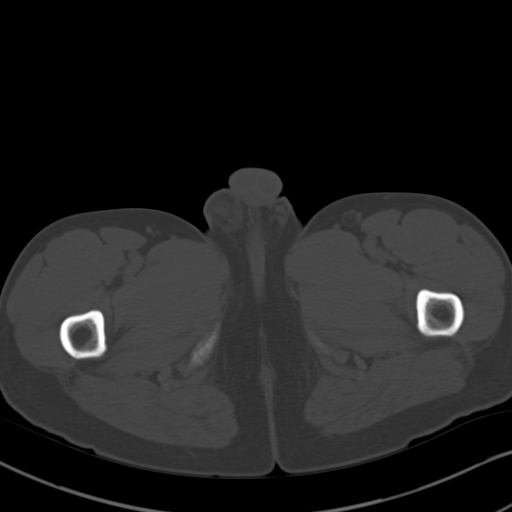
[im 10/83  soft-tissue]
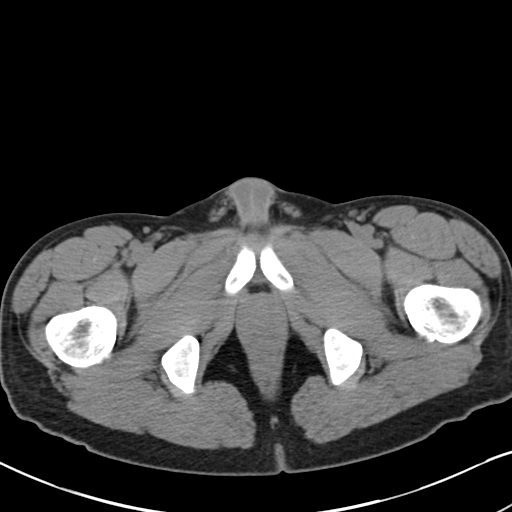
[im 17/83  soft-tissue]
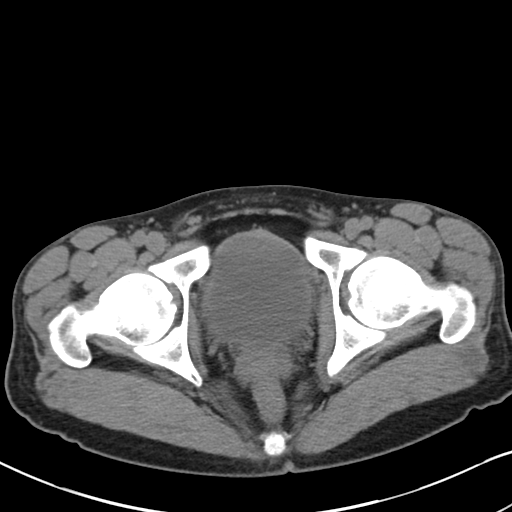
[im 23/83  soft-tissue]
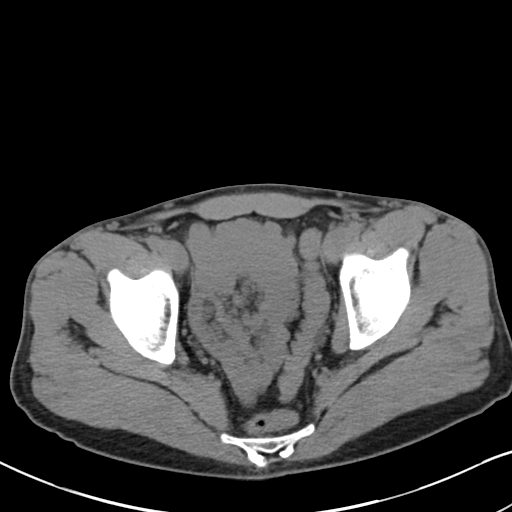
[im 30/83  soft-tissue]
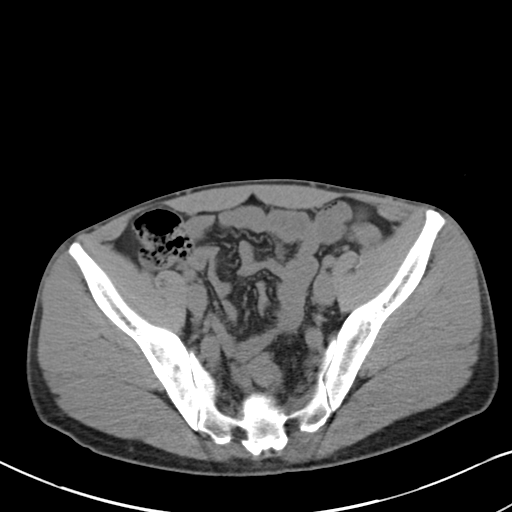
[im 37/83  soft-tissue]
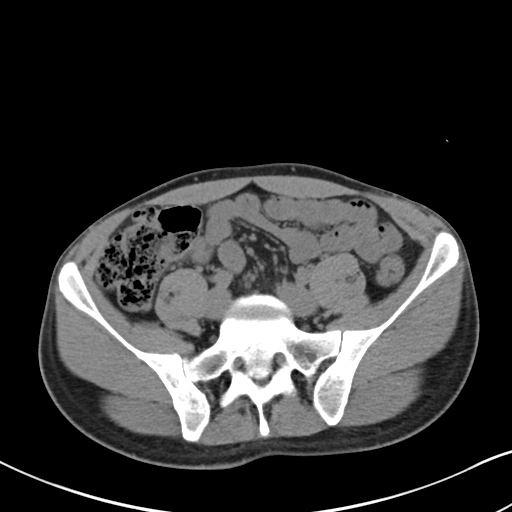
[im 43/83  soft-tissue]
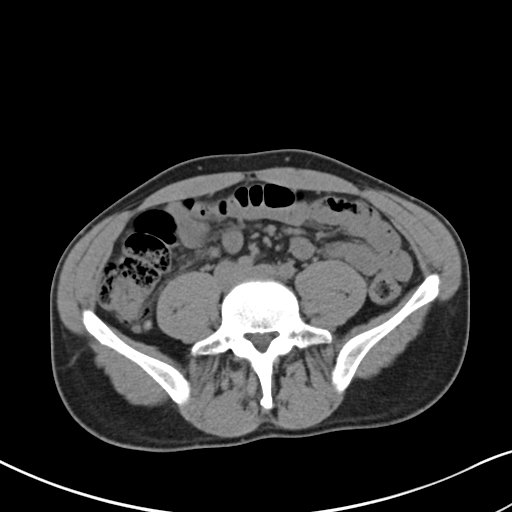
[im 46/83  soft-tissue]
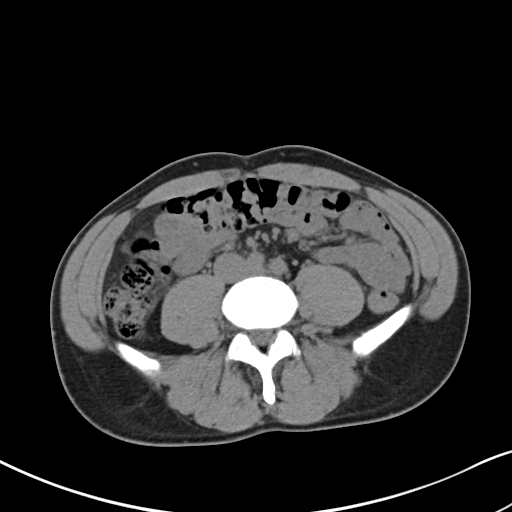
[im 53/83  soft-tissue]
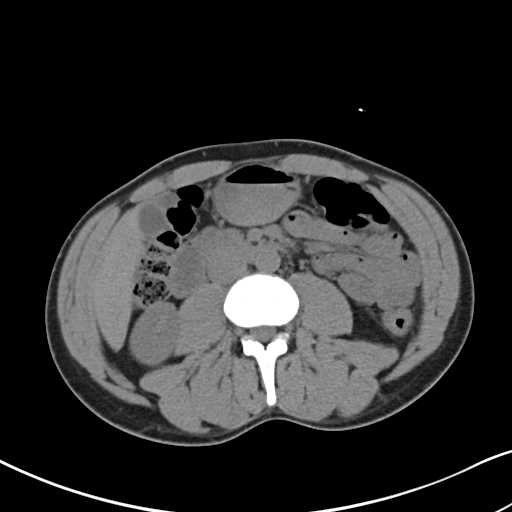
[im 53/83  bone]
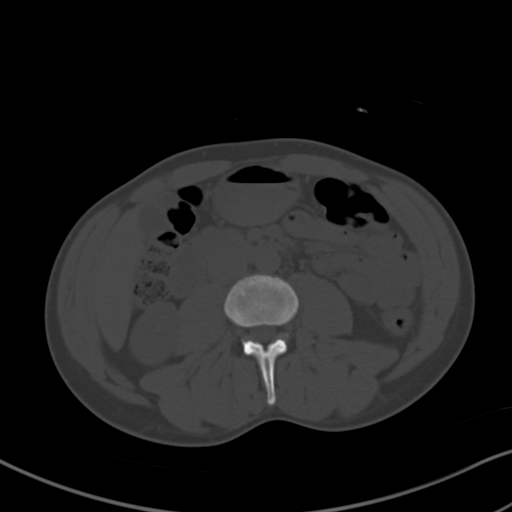
[im 60/83  soft-tissue]
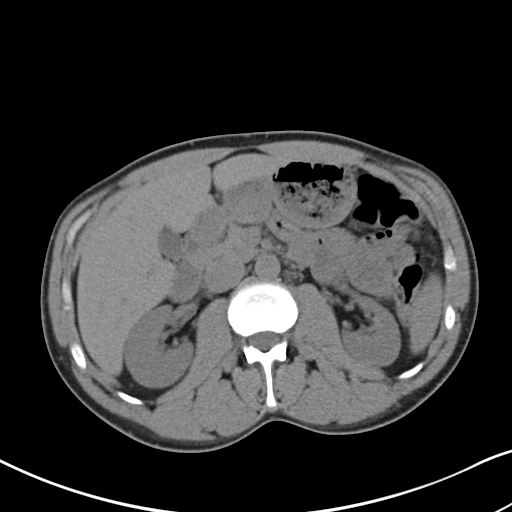
[im 66/83  soft-tissue]
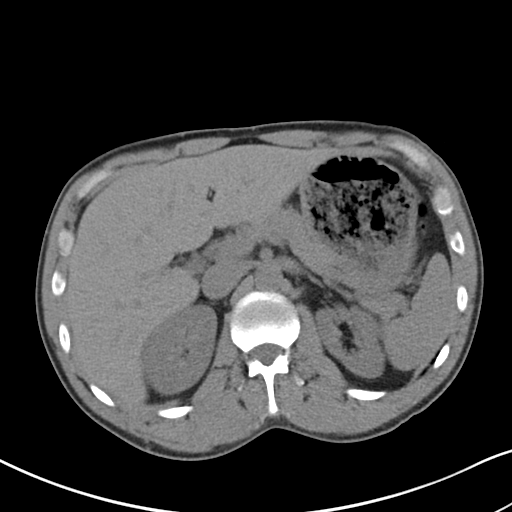
[im 73/83  soft-tissue]
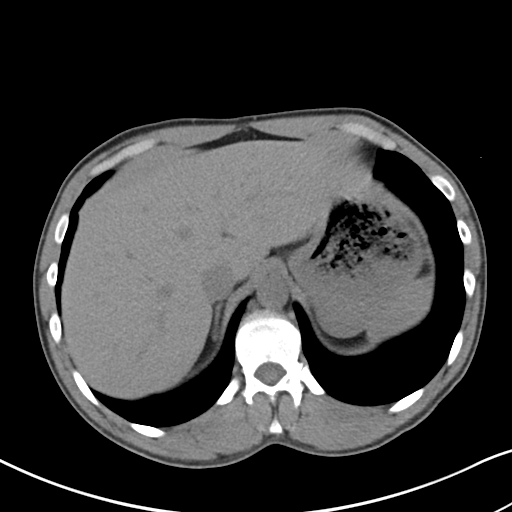
[im 79/83  soft-tissue]
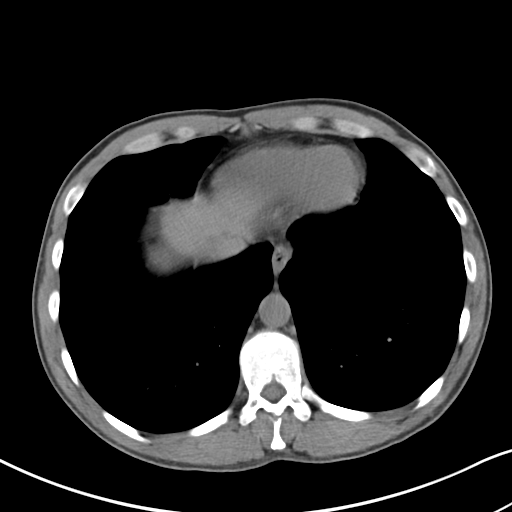

[Series 6: coronals · coronal · 0.72mm/px · 3 of 120 slices shown]
[im 40/120  soft-tissue]
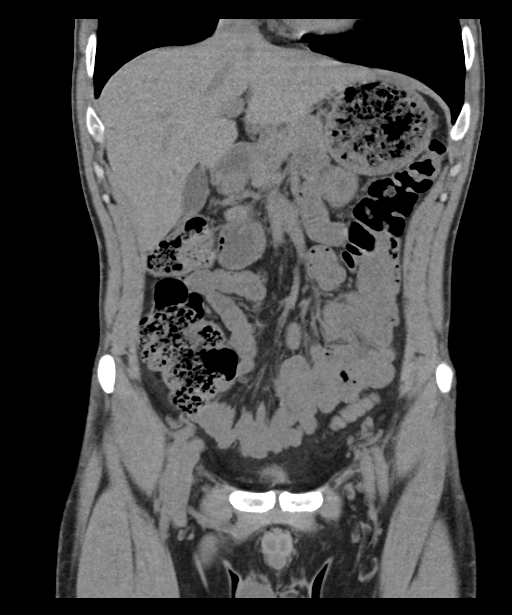
[im 53/120  soft-tissue]
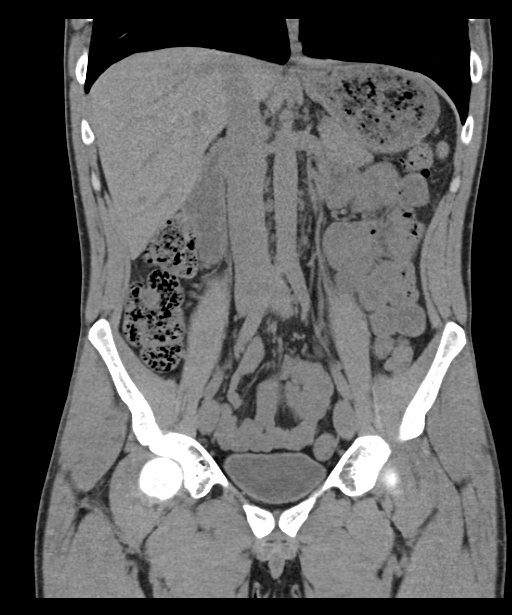
[im 67/120  soft-tissue]
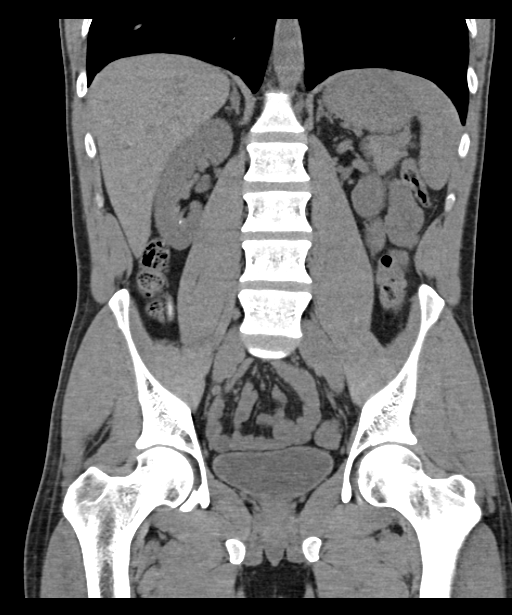

[16 of 46 positions shown; findings below may reference images not displayed]

FINDINGS: The visualized lung bases are clear.

The liver and spleen are unremarkable in appearance. The gallbladder
is within normal limits. The pancreas and adrenal glands are
unremarkable.

A 3 mm stone is noted at the interpole region of the right kidney.
The kidneys are otherwise unremarkable. There is no evidence of
hydronephrosis. No obstructing ureteral stones are seen. No
perinephric stranding is appreciated.

No free fluid is identified. The small bowel is unremarkable in
appearance. The stomach is within normal limits. No acute vascular
abnormalities are seen.

The appendix is normal in caliber, without evidence of appendicitis.
The colon is unremarkable in appearance.

The bladder is mildly distended and grossly unremarkable. The
prostate remains normal in size. No inguinal lymphadenopathy is
seen.

The anorectal canal is unremarkable in appearance. No soft tissue
inflammation is seen.

No acute osseous abnormalities are identified.
IMPRESSION: 1. No acute abnormality seen to explain the patient's symptoms. The
anorectal canal is unremarkable in appearance. No soft tissue
inflammation is seen.
2. 3 mm nonobstructing stone at the interpole region of the right
kidney. Kidneys otherwise unremarkable in appearance.

## 2018-03-23 ENCOUNTER — Emergency Department (HOSPITAL_COMMUNITY)
Admission: EM | Admit: 2018-03-23 | Discharge: 2018-03-24 | Disposition: A | Discharge status: Home / Self Care | Payer: Self-pay | Attending: Emergency Medicine | Admitting: Emergency Medicine

## 2018-03-23 ENCOUNTER — Other Ambulatory Visit: Payer: Self-pay

## 2018-03-23 DIAGNOSIS — R21 Rash and other nonspecific skin eruption: Secondary | ICD-10-CM | POA: Insufficient documentation

## 2018-03-23 DIAGNOSIS — F17210 Nicotine dependence, cigarettes, uncomplicated: Secondary | ICD-10-CM | POA: Insufficient documentation

## 2018-03-24 ENCOUNTER — Other Ambulatory Visit: Payer: Self-pay

## 2018-03-24 ENCOUNTER — Emergency Department (HOSPITAL_COMMUNITY)
Admission: EM | Admit: 2018-03-24 | Discharge: 2018-03-25 | Disposition: A | Discharge status: Other Acute Inpatient Hospital | Payer: Self-pay | Attending: Emergency Medicine | Admitting: Emergency Medicine

## 2018-03-24 ENCOUNTER — Encounter (HOSPITAL_COMMUNITY): Payer: Self-pay

## 2018-03-24 ENCOUNTER — Encounter (HOSPITAL_COMMUNITY): Payer: Self-pay | Admitting: Emergency Medicine

## 2018-03-24 DIAGNOSIS — F1520 Other stimulant dependence, uncomplicated: Secondary | ICD-10-CM | POA: Insufficient documentation

## 2018-03-24 DIAGNOSIS — F1120 Opioid dependence, uncomplicated: Secondary | ICD-10-CM | POA: Insufficient documentation

## 2018-03-24 DIAGNOSIS — F1910 Other psychoactive substance abuse, uncomplicated: Secondary | ICD-10-CM | POA: Insufficient documentation

## 2018-03-24 DIAGNOSIS — F1420 Cocaine dependence, uncomplicated: Secondary | ICD-10-CM | POA: Insufficient documentation

## 2018-03-24 DIAGNOSIS — F17210 Nicotine dependence, cigarettes, uncomplicated: Secondary | ICD-10-CM | POA: Insufficient documentation

## 2018-03-24 DIAGNOSIS — F332 Major depressive disorder, recurrent severe without psychotic features: Secondary | ICD-10-CM | POA: Insufficient documentation

## 2018-03-24 DIAGNOSIS — Z79899 Other long term (current) drug therapy: Secondary | ICD-10-CM | POA: Insufficient documentation

## 2018-03-24 MED ORDER — PREDNISONE 10 MG (21) PO TBPK: ORAL_TABLET | Freq: Every day | ORAL | 0 refills | Status: DC

## 2018-03-24 MED ORDER — METHYLPREDNISOLONE SODIUM SUCC 125 MG IJ SOLR
125.0000 mg | Freq: Once | INTRAMUSCULAR | Status: AC
Start: 2018-03-24 — End: 2018-03-24
  Administered 2018-03-24: 125 mg via INTRAVENOUS
  Filled 2018-03-24: qty 2

## 2018-03-24 MED ORDER — FAMOTIDINE IN NACL 20-0.9 MG/50ML-% IV SOLN
20.0000 mg | Freq: Once | INTRAVENOUS | Status: AC
  Administered 2018-03-24: 20 mg via INTRAVENOUS
  Filled 2018-03-24: qty 50

## 2018-03-24 MED ORDER — HYDROXYZINE HCL 25 MG PO TABS: 25.0000 mg | ORAL_TABLET | Freq: Four times a day (QID) | ORAL | 0 refills | Status: DC

## 2018-03-24 MED ORDER — FAMOTIDINE 20 MG PO TABS: 20.0000 mg | ORAL_TABLET | Freq: Two times a day (BID) | ORAL | 0 refills | Status: DC

## 2018-03-24 MED ORDER — DIPHENHYDRAMINE HCL 50 MG/ML IJ SOLN
25.0000 mg | Freq: Once | INTRAMUSCULAR | Status: AC
  Administered 2018-03-24: 25 mg via INTRAVENOUS
  Filled 2018-03-24: qty 1

## 2018-03-24 NOTE — ED Provider Notes (Signed)
[facility] EMERGENCY DEPARTMENT Provider Note   CSN: [id number] Arrival date & time: 03/23/18  2313     History   Chief Complaint Chief Complaint  Patient presents with  . Rash    HPI Brian [name] is a 32 y.o. male with a past medical history of polysubstance abuse, PUD, who presents to ED for evaluation of allergic reaction and rash for the past week.  He reports that he has had severe itching and a rash to his bilateral shoulders, back and chest.  He has tried several doses of Benadryl with only mild improvement in his symptoms.  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  He denies any new soaps, lotions, detergents or food ingestions.  He denies any previous history of similar symptoms in the past.  He is only known allergy is to fish which she states that he has not been eating.  He denies any lip swelling, signs of anaphylaxis currently or in the past week.  He denies any fever, chills, shortness of breath, injuries or falls, sick contacts with similar symptoms.  HPI  Past Medical History:  Diagnosis Date  . Alcohol abuse   . Cocaine abuse ([place])   . Kidney stones   . Narcotic abuse ([place])   . Peptic ulcer   . Polysubstance abuse ([place])   . Renal disorder    kidney stones    Patient Active Problem List   Diagnosis Date Noted  . Cellulitis 05/20/2015  . Dysuria 05/20/2015  . Smoker 05/20/2015  . Homeless single person 05/20/2015  . Drug abuse ([place])   . Uncomplicated opioid dependence ([place]) 03/08/2015  . Ileus of unspecified type ([place]) 05/28/2014  . Abdominal pain 05/28/2014  . AKI (acute kidney injury) ([place]) 05/28/2014  . Esophagitis, acute 05/28/2014  . Polysubstance abuse ([place]) 05/28/2014  . Hematemesis 05/28/2014    Past Surgical History:  Procedure Laterality Date  . ESOPHAGOGASTRODUODENOSCOPY N/A 05/29/2014   Procedure: ESOPHAGOGASTRODUODENOSCOPY (EGD) ;  Surgeon: Beryle Beams, MD;   Location: [facility] ENDOSCOPY;  Service: Endoscopy;  Laterality: N/A;  check with Dr. Collene Mares about sedation type/timinng - I recommend MAC  . HAND SURGERY  2006        Home Medications    Prior to Admission medications   Medication Sig Start Date End Date Taking? Authorizing Provider  cephALEXin (KEFLEX) 500 MG capsule Take 1 capsule (500 mg total) by mouth 4 (four) times daily. 05/25/15   Ahmed Prima, MD  famotidine (PEPCID) 20 MG tablet Take 1 tablet (20 mg total) by mouth 2 (two) times daily. 03/24/18   Sashia Campas, PA-C  hydrOXYzine (ATARAX/VISTARIL) 25 MG tablet Take 1 tablet (25 mg total) by mouth every 6 (six) hours. 03/24/18   Yasmine Kilbourne, PA-C  ibuprofen (ADVIL,MOTRIN) 600 MG tablet Take 1 tablet (600 mg total) by mouth every 6 (six) hours as needed. Patient not taking: Reported on 05/20/2015 05/19/15   Julianne Rice, MD  predniSONE (STERAPRED UNI-PAK 21 TAB) 10 MG (21) TBPK tablet Take by mouth daily. Take 6 tabs by mouth daily  for 2 days, then 5 tabs for 2 days, then 4 tabs for 2 days, then 3 tabs for 2 days, 2 tabs for 2 days, then 1 tab by mouth daily for 2 days 03/24/18   Delia Heady, PA-C    Family History No family history on file.  Social History Social History   Tobacco Use  . Smoking  status: Current Every Day Smoker    Packs/day: 0.50    Types: Cigarettes  . Smokeless tobacco: Never Used  Substance Use Topics  . Alcohol use: Yes    Comment: daily  . Drug use: Yes    Types: Marijuana, Cocaine, IV    Comment: OPIATES     Allergies   Patient has no known allergies.   Review of Systems Review of Systems  Constitutional: Negative for appetite change, chills and fever.  HENT: Negative for ear pain, facial swelling, rhinorrhea, sneezing and sore throat.   Eyes: Negative for photophobia and visual disturbance.  Respiratory: Negative for cough, chest tightness, shortness of breath and wheezing.   Cardiovascular: Negative for chest pain and palpitations.    Gastrointestinal: Negative for abdominal pain, blood in stool, constipation, diarrhea, nausea and vomiting.  Genitourinary: Negative for dysuria, hematuria and urgency.  Musculoskeletal: Negative for myalgias.  Skin: Positive for rash.  Neurological: Negative for dizziness, weakness and light-headedness.     Physical Exam Updated Vital Signs BP 139/78 (BP Location: Right Arm)   Pulse 95   Temp 98.3 F (36.8 C) (Oral)   Resp 18   Ht 5' 8"  (1.727 m)   Wt 72.6 kg (160 lb)   SpO2 100%   BMI 24.33 kg/m   Physical Exam  Constitutional: He appears well-developed and well-nourished. No distress.  Nontoxic-appearing and in no acute distress.  Patient itching repeatedly at torso.  No lip swelling of angioedema or anaphylaxis noted.  Speaking complete sentences without difficulty.  HENT:  Head: Normocephalic and atraumatic.  Nose: Nose normal.  Eyes: Conjunctivae and EOM are normal. Left eye exhibits no discharge. No scleral icterus.  Neck: Normal range of motion. Neck supple.  Cardiovascular: Normal rate, regular rhythm, normal heart sounds and intact distal pulses. Exam reveals no gallop and no friction rub.  No murmur heard. Pulmonary/Chest: Effort normal and breath sounds normal. No respiratory distress.  Abdominal: Soft. Bowel sounds are normal. He exhibits no distension. There is no tenderness. There is no guarding.  Musculoskeletal: Normal range of motion. He exhibits no edema.  Neurological: He is alert. He exhibits normal muscle tone. Coordination normal.  Skin: Skin is warm and dry. Rash noted. He is not diaphoretic.  Raised papular, erythematous rash with mild excoriation noted overlying.  No blistering, superimposed infection, warmth noted.  Psychiatric: He has a normal mood and affect.  Nursing note and vitals reviewed.    ED Treatments / Results  Labs (all labs ordered are listed, but only abnormal results are displayed) Labs Reviewed - No data to  display  EKG None  Radiology No results found.  Procedures Procedures (including critical care time)  Medications Ordered in ED Medications  methylPREDNISolone sodium succinate (SOLU-MEDROL) 125 mg/2 mL injection 125 mg (125 mg Intravenous Given 03/24/18 0959)  diphenhydrAMINE (BENADRYL) injection 25 mg (25 mg Intravenous Given 03/24/18 0959)  famotidine (PEPCID) IVPB 20 mg premix (0 mg Intravenous Stopped 03/24/18 1137)     Initial Impression / Assessment and Plan / ED Course  I have reviewed the triage vital signs and the nursing notes.  Pertinent labs & imaging results that were available during my care of the patient were reviewed by me and considered in my medical decision making (see chart for details).     Able to ED for evaluation of a logical home loss possibly possible itching and rash to bilateral shoulders, back and chest.  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  Denies any new soaps, lotions, detergents or food ingestions.  No previous history of similar symptoms in the past.  Only known allergy is to fish rate she has not been eating.  Denies any signs of anaphylaxis or angioedema.  On physical exam he does have a diffuse papular rash with excoriation noted to upper extremities, shoulders, chest.  Pt has a patent airway without stridor and is handling secretions without difficulty; no angioedema. No blisters, no pustules, no warmth, no draining sinus tracts, no superficial abscesses, no bullous impetigo, no vesicles, no desquamation, no target lesions with dusky purpura or a central bulla. Not tender to touch. No concern for superimposed infection. No concern for SJS, TEN, TSS, tick borne illness, syphilis or other life-threatening condition.  Patient treated with Solu-Medrol, Pepcid and Benadryl with improvement in symptoms.  Reports improvement in itching as well.  Will discharge home with remainder of prednisone course,  Pepcid and Vistaril for itching.  Advised to return to ED for any severe worsening symptoms.  Portions of this note were generated with [employer]. Dictation errors may occur despite best attempts at proofreading.   Final Clinical Impressions(s) / ED Diagnoses   Final diagnoses:  Rash and nonspecific skin eruption    ED Discharge Orders        Ordered    predniSONE (STERAPRED UNI-PAK 21 TAB) 10 MG (21) TBPK tablet  Daily     03/24/18 1209    famotidine (PEPCID) 20 MG tablet  2 times daily     03/24/18 1209    hydrOXYzine (ATARAX/VISTARIL) 25 MG tablet  Every 6 hours     03/24/18 1209       Delia Heady, PA-C 03/24/18 1229    Mesner, Corene Cornea, MD 03/25/18 1016

## 2018-03-24 NOTE — ED Notes (Signed)
PA notified that pt is finished with medications, has reportedly been finished with them for about an hour.

## 2018-03-24 NOTE — ED Triage Notes (Signed)
Patient reports being seen here yesterday for a rash where he saw an old family member causing him to relapse after being clean for 90 days from heroin. Last used today 1200. Pt also reports SI with a plan to overdose. Hx of anxiety and depression, but has been out of meds for 2 weeks.

## 2018-03-24 NOTE — ED Notes (Signed)
PA at bedside.

## 2018-03-24 NOTE — ED Triage Notes (Signed)
Pt from home with complaint of rash x 3 days. No new environmental factors. 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 Pt does have raised nodule-like rash on bilateral shoulders.  Itching very badly. Benadryl taken at home repeatedly with no improvement.

## 2018-03-25 ENCOUNTER — Encounter (HOSPITAL_COMMUNITY): Payer: Self-pay

## 2018-03-25 ENCOUNTER — Encounter (HOSPITAL_COMMUNITY): Payer: Self-pay | Admitting: Registered Nurse

## 2018-03-25 ENCOUNTER — Other Ambulatory Visit: Payer: Self-pay

## 2018-03-25 ENCOUNTER — Inpatient Hospital Stay (HOSPITAL_COMMUNITY)
Admission: AD | Admit: 2018-03-25 | Discharge: 2018-04-01 | DRG: 885 | Disposition: A | Discharge status: Home / Self Care | Payer: No Typology Code available for payment source | Source: Intra-hospital | Attending: Psychiatry | Admitting: Psychiatry

## 2018-03-25 DIAGNOSIS — F1124 Opioid dependence with opioid-induced mood disorder: Secondary | ICD-10-CM | POA: Diagnosis present

## 2018-03-25 DIAGNOSIS — K219 Gastro-esophageal reflux disease without esophagitis: Secondary | ICD-10-CM | POA: Diagnosis present

## 2018-03-25 DIAGNOSIS — F1520 Other stimulant dependence, uncomplicated: Secondary | ICD-10-CM | POA: Diagnosis not present

## 2018-03-25 DIAGNOSIS — F1510 Other stimulant abuse, uncomplicated: Secondary | ICD-10-CM | POA: Diagnosis not present

## 2018-03-25 DIAGNOSIS — R52 Pain, unspecified: Secondary | ICD-10-CM | POA: Diagnosis not present

## 2018-03-25 DIAGNOSIS — Z79899 Other long term (current) drug therapy: Secondary | ICD-10-CM

## 2018-03-25 DIAGNOSIS — R109 Unspecified abdominal pain: Secondary | ICD-10-CM | POA: Diagnosis not present

## 2018-03-25 DIAGNOSIS — F1994 Other psychoactive substance use, unspecified with psychoactive substance-induced mood disorder: Secondary | ICD-10-CM | POA: Diagnosis present

## 2018-03-25 DIAGNOSIS — Z8711 Personal history of peptic ulcer disease: Secondary | ICD-10-CM

## 2018-03-25 DIAGNOSIS — Z91013 Allergy to seafood: Secondary | ICD-10-CM | POA: Diagnosis not present

## 2018-03-25 DIAGNOSIS — F3181 Bipolar II disorder: Principal | ICD-10-CM | POA: Diagnosis present

## 2018-03-25 DIAGNOSIS — F419 Anxiety disorder, unspecified: Secondary | ICD-10-CM | POA: Diagnosis present

## 2018-03-25 DIAGNOSIS — R45851 Suicidal ideations: Secondary | ICD-10-CM | POA: Diagnosis present

## 2018-03-25 DIAGNOSIS — F1120 Opioid dependence, uncomplicated: Secondary | ICD-10-CM

## 2018-03-25 DIAGNOSIS — F1524 Other stimulant dependence with stimulant-induced mood disorder: Secondary | ICD-10-CM | POA: Diagnosis present

## 2018-03-25 DIAGNOSIS — F1110 Opioid abuse, uncomplicated: Secondary | ICD-10-CM | POA: Diagnosis not present

## 2018-03-25 DIAGNOSIS — F329 Major depressive disorder, single episode, unspecified: Secondary | ICD-10-CM | POA: Diagnosis not present

## 2018-03-25 DIAGNOSIS — G4700 Insomnia, unspecified: Secondary | ICD-10-CM | POA: Diagnosis present

## 2018-03-25 DIAGNOSIS — Z818 Family history of other mental and behavioral disorders: Secondary | ICD-10-CM | POA: Diagnosis not present

## 2018-03-25 DIAGNOSIS — Z8659 Personal history of other mental and behavioral disorders: Secondary | ICD-10-CM

## 2018-03-25 DIAGNOSIS — F17210 Nicotine dependence, cigarettes, uncomplicated: Secondary | ICD-10-CM | POA: Diagnosis present

## 2018-03-25 HISTORY — DX: Depression, unspecified: F32.A

## 2018-03-25 HISTORY — DX: Anxiety disorder, unspecified: F41.9

## 2018-03-25 HISTORY — DX: Major depressive disorder, single episode, unspecified: F32.9

## 2018-03-25 LAB — COMPREHENSIVE METABOLIC PANEL
ALT: 38 U/L (ref 17–63)
AST: 37 U/L (ref 15–41)
Albumin: 4.2 g/dL (ref 3.5–5.0)
Alkaline Phosphatase: 88 U/L (ref 38–126)
Anion gap: 12 (ref 5–15)
BUN: 11 mg/dL (ref 6–20)
CHLORIDE: 104 mmol/L (ref 101–111)
CO2: 22 mmol/L (ref 22–32)
Calcium: 9.5 mg/dL (ref 8.9–10.3)
Creatinine, Ser: 1.31 mg/dL — ABNORMAL HIGH (ref 0.61–1.24)
GFR calc Af Amer: >60 mL/min (ref >60)
Glucose, Bld: 123 mg/dL — ABNORMAL HIGH (ref 65–99)
POTASSIUM: 4.1 mmol/L (ref 3.5–5.1)
SODIUM: 138 mmol/L (ref 135–145)
Total Bilirubin: 0.5 mg/dL (ref 0.3–1.2)
Total Protein: 8.2 g/dL — ABNORMAL HIGH (ref 6.5–8.1)

## 2018-03-25 LAB — CBC
HCT: 42.9 % (ref 39.0–52.0)
HEMOGLOBIN: 13.5 g/dL (ref 13.0–17.0)
MCH: 27.6 pg (ref 26.0–34.0)
MCHC: 31.5 g/dL (ref 30.0–36.0)
MCV: 87.7 fL (ref 78.0–100.0)
PLATELETS: 265 10*3/uL (ref 150–400)
RBC: 4.89 MIL/uL (ref 4.22–5.81)
RDW: 14.5 % (ref 11.5–15.5)
WBC: 11.3 10*3/uL — AB (ref 4.0–10.5)

## 2018-03-25 LAB — RAPID URINE DRUG SCREEN, HOSP PERFORMED
AMPHETAMINES: POSITIVE — AB
BENZODIAZEPINES: NOT DETECTED
Barbiturates: NOT DETECTED
COCAINE: POSITIVE — AB
OPIATES: POSITIVE — AB
Tetrahydrocannabinol: NOT DETECTED

## 2018-03-25 LAB — ETHANOL: Alcohol, Ethyl (B): <10 mg/dL (ref <10)

## 2018-03-25 LAB — SALICYLATE LEVEL

## 2018-03-25 LAB — ACETAMINOPHEN LEVEL: Acetaminophen (Tylenol), Serum: <10 ug/mL — ABNORMAL LOW (ref 10–30)

## 2018-03-25 MED ORDER — HYDROXYZINE HCL 25 MG PO TABS
25.0000 mg | ORAL_TABLET | Freq: Four times a day (QID) | ORAL | Status: DC
  Administered 2018-03-25 – 2018-03-27 (×6): 25 mg via ORAL
  Filled 2018-03-25 (×17): qty 1

## 2018-03-25 MED ORDER — TRAZODONE HCL 150 MG PO TABS
150.0000 mg | ORAL_TABLET | Freq: Every evening | ORAL | Status: DC | PRN
  Administered 2018-03-25: 150 mg via ORAL
  Filled 2018-03-25: qty 1

## 2018-03-25 MED ORDER — GABAPENTIN 300 MG PO CAPS
600.0000 mg | ORAL_CAPSULE | Freq: Three times a day (TID) | ORAL | Status: DC
  Administered 2018-03-25 – 2018-04-01 (×20): 600 mg via ORAL
  Filled 2018-03-25 (×3): qty 2
  Filled 2018-03-25: qty 42
  Filled 2018-03-25 (×8): qty 2
  Filled 2018-03-25: qty 42
  Filled 2018-03-25 (×2): qty 2
  Filled 2018-03-25: qty 42
  Filled 2018-03-25: qty 2
  Filled 2018-03-25: qty 42
  Filled 2018-03-25 (×8): qty 2
  Filled 2018-03-25: qty 42
  Filled 2018-03-25 (×3): qty 2
  Filled 2018-03-25: qty 42
  Filled 2018-03-25: qty 2

## 2018-03-25 MED ORDER — BUPROPION HCL ER (XL) 150 MG PO TB24
150.0000 mg | ORAL_TABLET | Freq: Every day | ORAL | Status: DC
  Administered 2018-03-25: 150 mg via ORAL
  Filled 2018-03-25: qty 1

## 2018-03-25 MED ORDER — NICOTINE 21 MG/24HR TD PT24
21.0000 mg | MEDICATED_PATCH | Freq: Every day | TRANSDERMAL | Status: DC
  Administered 2018-03-25: 21 mg via TRANSDERMAL
  Filled 2018-03-25: qty 1

## 2018-03-25 MED ORDER — FAMOTIDINE 20 MG PO TABS
20.0000 mg | ORAL_TABLET | Freq: Two times a day (BID) | ORAL | Status: DC
  Administered 2018-03-27 – 2018-04-01 (×3): 20 mg via ORAL
  Filled 2018-03-25 (×9): qty 1
  Filled 2018-03-25: qty 14
  Filled 2018-03-25 (×2): qty 1
  Filled 2018-03-25: qty 14
  Filled 2018-03-25 (×6): qty 1
  Filled 2018-03-25 (×2): qty 14

## 2018-03-25 MED ORDER — ACETAMINOPHEN 325 MG PO TABS: 650.0000 mg | ORAL_TABLET | ORAL | Status: DC | PRN

## 2018-03-25 MED ORDER — FAMOTIDINE 20 MG PO TABS
20.0000 mg | ORAL_TABLET | Freq: Two times a day (BID) | ORAL | Status: DC
  Filled 2018-03-25: qty 1

## 2018-03-25 MED ORDER — BUPROPION HCL ER (XL) 150 MG PO TB24
150.0000 mg | ORAL_TABLET | Freq: Every day | ORAL | Status: DC
  Administered 2018-03-26: 150 mg via ORAL
  Filled 2018-03-25 (×4): qty 1

## 2018-03-25 MED ORDER — GABAPENTIN 600 MG PO TABS
600.0000 mg | ORAL_TABLET | Freq: Three times a day (TID) | ORAL | Status: DC
  Administered 2018-03-25: 600 mg via ORAL
  Filled 2018-03-25: qty 1

## 2018-03-25 MED ORDER — NICOTINE 21 MG/24HR TD PT24
21.0000 mg | MEDICATED_PATCH | Freq: Every day | TRANSDERMAL | Status: DC
  Administered 2018-03-26: 21 mg via TRANSDERMAL
  Filled 2018-03-25 (×10): qty 1

## 2018-03-25 MED ORDER — HYDROXYZINE HCL 25 MG PO TABS
25.0000 mg | ORAL_TABLET | Freq: Four times a day (QID) | ORAL | Status: DC
  Administered 2018-03-25: 25 mg via ORAL
  Filled 2018-03-25: qty 1

## 2018-03-25 MED ORDER — TRAZODONE HCL 50 MG PO TABS: 150.0000 mg | ORAL_TABLET | Freq: Every evening | ORAL | Status: DC | PRN

## 2018-03-25 NOTE — ED Notes (Signed)
TTS at bedside. 

## 2018-03-25 NOTE — ED Provider Notes (Signed)
[facility] EMERGENCY DEPARTMENT Provider Note   CSN: [id number] Arrival date & time: 03/24/18  2346     History   Chief Complaint Chief Complaint  Patient presents with  . Suicidal    HPI Brian [name] is a 32 y.o. male.  32 year old male presents to the emergency department for evaluation of suicidal ideations.  He reports a history of polysubstance abuse.  He was 90 days clean from heroin, but did have a period of consistent IV methamphetamine use.  He became clean from this as well, but sought an old family member when leaving the hospital earlier today.  He states this family member was with an individual that he used to use drugs with.  Patient subsequently went with his family and used both heroin and methamphetamines.  He states that he is realizing that his worsening depression is not associated with his drug use and he believes he needs help for this.  He has been on Wellbutrin in the past, but has been noncompliant for the past 4 weeks.  He expresses plan of suicide with thought to overdose on heroin.  Endorses history of 6 prior suicide attempts.  No homicidal ideations or alcohol use.     Past Medical History:  Diagnosis Date  . Alcohol abuse   . Cocaine abuse ([place])   . Kidney stones   . Narcotic abuse ([place])   . Peptic ulcer   . Polysubstance abuse ([place])   . Renal disorder    kidney stones    Patient Active Problem List   Diagnosis Date Noted  . Cellulitis 05/20/2015  . Dysuria 05/20/2015  . Smoker 05/20/2015  . Homeless single person 05/20/2015  . Drug abuse ([place])   . Uncomplicated opioid dependence ([place]) 03/08/2015  . Ileus of unspecified type ([place]) 05/28/2014  . Abdominal pain 05/28/2014  . AKI (acute kidney injury) ([place]) 05/28/2014  . Esophagitis, acute 05/28/2014  . Polysubstance abuse ([place]) 05/28/2014  . Hematemesis 05/28/2014    Past Surgical History:  Procedure Laterality Date  . ESOPHAGOGASTRODUODENOSCOPY N/A 05/29/2014     Procedure: ESOPHAGOGASTRODUODENOSCOPY (EGD) ;  Surgeon: Beryle Beams, MD;  Location: [facility] ENDOSCOPY;  Service: Endoscopy;  Laterality: N/A;  check with Dr. Collene Mares about sedation type/timinng - I recommend MAC  . HAND SURGERY  2006        Home Medications    Prior to Admission medications   Medication Sig Start Date End Date Taking? Authorizing Provider  buPROPion (WELLBUTRIN XL) 150 MG 24 hr tablet Take 150 mg by mouth daily.   Yes [provider]  gabapentin (NEURONTIN) 600 MG tablet Take 600 mg by mouth 3 (three) times daily.   Yes [provider]  traZODone (DESYREL) 150 MG tablet Take 150 mg by mouth at bedtime as needed. 01/13/18  Yes [provider]  cephALEXin (KEFLEX) 500 MG capsule Take 1 capsule (500 mg total) by mouth 4 (four) times daily. Patient not taking: Reported on 03/25/2018 05/25/15   Ahmed Prima, MD  famotidine (PEPCID) 20 MG tablet Take 1 tablet (20 mg total) by mouth 2 (two) times daily. 03/24/18   Khatri, Hina, PA-C  hydrOXYzine (ATARAX/VISTARIL) 25 MG tablet Take 1 tablet (25 mg total) by mouth every 6 (six) hours. 03/24/18   Khatri, Hina, PA-C  ibuprofen (ADVIL,MOTRIN) 600 MG tablet Take 1 tablet (600 mg total) by mouth every 6 (six) hours as needed. Patient not taking: Reported on 05/20/2015 05/19/15   Julianne Rice, MD  predniSONE (  STERAPRED UNI-PAK 21 TAB) 10 MG (21) TBPK tablet Take by mouth daily. Take 6 tabs by mouth daily  for 2 days, then 5 tabs for 2 days, then 4 tabs for 2 days, then 3 tabs for 2 days, 2 tabs for 2 days, then 1 tab by mouth daily for 2 days 03/24/18   Delia Heady, PA-C    Family History No family history on file.  Social History Social History   Tobacco Use  . Smoking status: Current Every Day Smoker    Packs/day: 0.50    Types: Cigarettes  . Smokeless tobacco: Never Used  Substance Use Topics  . Alcohol use: Yes    Comment: daily  . Drug use: Yes    Types: Marijuana, Cocaine, IV    Comment:  OPIATES     Allergies   Fish allergy   Review of Systems Review of Systems Ten systems reviewed and are negative for acute change, except as noted in the HPI.    Physical Exam Updated Vital Signs BP (!) 155/103 (BP Location: Right Arm)   Pulse (!) 115   Temp 99 F (37.2 C) (Oral)   Resp 20   Ht 5' 8"  (1.727 m)   Wt 74.8 kg (165 lb)   SpO2 98%   BMI 25.09 kg/m   Physical Exam  Constitutional: He is oriented to person, place, and time. He appears well-developed and well-nourished. No distress.  Nontoxic appearing and in NAD  HENT:  Head: Normocephalic and atraumatic.  Eyes: Conjunctivae and EOM are normal. No scleral icterus.  Neck: Normal range of motion.  Pulmonary/Chest: Effort normal. No stridor. No respiratory distress.  Respirations even and unlabored  Musculoskeletal: Normal range of motion.  Neurological: He is alert and oriented to person, place, and time. He exhibits normal muscle tone. Coordination normal.  GCS 15. Patient moving all extremities.  Skin: Skin is warm and dry. No rash noted. He is not diaphoretic. No erythema. No pallor.  Psychiatric: His speech is normal and behavior is normal. He exhibits a depressed mood. He expresses suicidal ideation. He expresses no homicidal ideation.  Nursing note and vitals reviewed.    ED Treatments / Results  Labs (all labs ordered are listed, but only abnormal results are displayed) Labs Reviewed  COMPREHENSIVE METABOLIC PANEL - Abnormal; Notable for the following components:      Result Value   Glucose, Bld 123 (*)    Creatinine, Ser 1.31 (*)    Total Protein 8.2 (*)    All other components within normal limits  ACETAMINOPHEN LEVEL - Abnormal; Notable for the following components:   Acetaminophen (Tylenol), Serum <10 (*)    All other components within normal limits  CBC - Abnormal; Notable for the following components:   WBC 11.3 (*)    All other components within normal limits  RAPID URINE DRUG SCREEN,  HOSP PERFORMED - Abnormal; Notable for the following components:   Opiates POSITIVE (*)    Cocaine POSITIVE (*)    Amphetamines POSITIVE (*)    All other components within normal limits  ETHANOL  SALICYLATE LEVEL    EKG None  Radiology No results found.  Procedures Procedures (including critical care time)  Medications Ordered in ED Medications  acetaminophen (TYLENOL) tablet 650 mg (has no administration in time range)  nicotine (NICODERM CQ - dosed in mg/24 hours) patch 21 mg (has no administration in time range)     Initial Impression / Assessment and Plan / ED Course  I have  reviewed the triage vital signs and the nursing notes.  Pertinent labs & imaging results that were available during my care of the patient were reviewed by me and considered in my medical decision making (see chart for details).     32 year old male presents to the emergency department for worsening depression and suicidal ideations.  He has been medically cleared and evaluated by TTS who recommend psychiatric evaluation in the morning.  Disposition to be determined by oncoming ED provider.   Final Clinical Impressions(s) / ED Diagnoses   Final diagnoses:  Polysubstance abuse [facility])    ED Discharge Orders    None       Antonietta Breach, PA-C 03/25/18 0542    Ezequiel Essex, MD 03/25/18 [phone]

## 2018-03-25 NOTE — ED Notes (Signed)
TTS consult in progress. °

## 2018-03-25 NOTE — Progress Notes (Signed)
Adult Psychoeducational Group Note  Date:  03/25/2018 Time:  8:42 PM  Group Topic/Focus:  Wrap-Up Group:   The focus of this group is to help patients review their daily goal of treatment and discuss progress on daily workbooks.  Participation Level:  Active  Participation Quality:  Appropriate  Affect:  Appropriate  Cognitive:  Alert  Insight: Appropriate  Engagement in Group:  Engaged  Modes of Intervention:  Discussion  Additional Comments:  Pt stated that he is happy about being here because he knows that he has to get better in order to have a relationship with his son.   Brian [name] 03/25/2018, 8:42 PM

## 2018-03-25 NOTE — ED Notes (Signed)
Per [facility]: Pt meets inpatient criteria.

## 2018-03-25 NOTE — ED Notes (Signed)
Pt received breakfast tray 

## 2018-03-25 NOTE — ED Notes (Signed)
MD contacted to order home meds. Orders received.

## 2018-03-25 NOTE — ED Notes (Signed)
Pt up too restroom and nurses station requesting phone number from personal folder. Pt given folder for number folder given back after phone call

## 2018-03-25 NOTE — Plan of Care (Signed)
Patient was oriented to the unit. Belongings are in search room and will be searched by night shift. All questions answered. Patient endorses SI passively with plan to overdose. Contracts for safety. Denies HI/AVH.  Safety maintained with 15 minute checks. Will continue to monitor.   Problem: Education: Goal: Utilization of techniques to improve thought processes will improve Outcome: Not Progressing Goal: Knowledge of the prescribed therapeutic regimen will improve Outcome: Not Progressing   Problem: Activity: Goal: Interest or engagement in leisure activities will improve Outcome: Not Progressing Goal: Imbalance in normal sleep/wake cycle will improve Outcome: Not Progressing   Problem: Coping: Goal: Coping ability will improve Outcome: Not Progressing Goal: Will verbalize feelings Outcome: Not Progressing   Problem: Health Behavior/Discharge Planning: Goal: Ability to make decisions will improve Outcome: Not Progressing Goal: Compliance with therapeutic regimen will improve Outcome: Not Progressing   Problem: Role Relationship: Goal: Will demonstrate positive changes in social behaviors and relationships Outcome: Not Progressing   Problem: Safety: Goal: Ability to disclose and discuss suicidal ideas will improve Outcome: Not Progressing Goal: Ability to identify and utilize support systems that promote safety will improve Outcome: Not Progressing   Problem: Self-Concept: Goal: Will verbalize positive feelings about self Outcome: Not Progressing Goal: Level of anxiety will decrease Outcome: Not Progressing   Problem: Self-Concept: Goal: Ability to disclose and discuss suicidal ideas will improve Outcome: Not Progressing Goal: Will verbalize positive feelings about self Outcome: Not Progressing

## 2018-03-25 NOTE — Consult Note (Signed)
Tele Assessment   Brian [name], 32 y.o., male patient presented to [facility] with complaints of suicidal ideation.  Patient seen via telepsych by this provider; chart reviewed and consulted with Dr. Dwyane Dee on 03/25/18.  On evaluation AVEREY TROMPETER reports that he has had problems with depression for years.  Reports that he was living in [place] where he had outpatient psychiatric services and that he has had multiple psychiatric hospitalizations and rehab.  Reports that he recently moved back to [facility] and is now staying in the [place] but had a slip where he use crystal meth and shot up heroin.  Reports he tried to overdose with the heroin " but I guess it was not enough.  I wanted to go back and get some more and try again but I came to the hospital."  Patient reports that he has a history diagnosis of manic depression and bipolar disorder.  States that he is being treated with Wellbutrin which he does not feel is working.  States that he needs to get help for his depression and that he does not feel safe and he cannot guarantee that if he leaves the hospital " I want to go by heroin try to overdose again.  I have had 6 overdoses and for those were suicide attempt.  I thought my depression was coming from not being in [place] and being away from a family and my son; but, now that him back in [place] my depression has not gotten any better.  I do not know anything about the services that [facility] has and I know I need help.  I was doing good I had not even thought about any drugs; but I guess I was thinking that the drugs might help my depression since my depression then go away after I stop doing the drugs."  Patient continues to endorse suicidal ideation with a plan to overdose on heroin.  Patient denies homicidal ideation, psychosis, and paranoia. During evaluation Brian [name] is alert/oriented x 4; calm/cooperative; and mood congruent with affect.  He does not appear to be  responding to internal/external stimuli or delusional thoughts.  Patient denied homicidal ideation, psychosis, and paranoia; but continues to endorse suicidal ideation and is unable to contract for safety.  Patient answered question appropriately.  Recommendations: Inpatient psychiatric treatment  Disposition:  Recommend psychiatric Inpatient admission when medically cleared.   Earleen Newport, NP

## 2018-03-25 NOTE — ED Notes (Addendum)
Pt supervised looking for phone number listed in personal yellow folder to call friend. Pt not able to reach anyone, not counting as one of his phone calls

## 2018-03-25 NOTE — ED Notes (Signed)
Lunch tray ordered for pt at this time.

## 2018-03-25 NOTE — BH Assessment (Addendum)
Tele Assessment Note   Patient Name: Brian [name] MRN: [id number] Referring Physician: PA Deborah Chalk  Location of Patient: [facility] Location of Provider: [place] is an 32 y.o. male.  The pt came due to suicidal thoughts and for SA treatment .  The pt relapsed after 40 days clean on heroin and crystal meth.  His UDS was positive for opiates, cocaine and meth.  The pt denies using cocaine recently.  The pt stated he plans to overdose on heroin to kill himself.  The pt has had 6 suicide attempts in the past.  Most of his attempts were overdoses on medication.  The pt has been hospitalized several times.  He stated he has been at [facility] about 13 times and [facility] 3 times.  The pt has also been to a hospital in [place], [place] and to [facility]. The pt last overdosed April 2019.   The pt is living in the [place] and has been in [place] for about a month.  Yesterday he said he saw a cousin he used to get high with and got high with him.  Other than the [facility], the pt is not seeing a counselor or psychiatrist.  The pt is currently divorced.  His family lives in the [place] area, but he does not have much contact with them.  The pt denies self harm.  He denies having access to a gun and HI.  The pt has a court date June 7 for probation violation.  It is unclear why the pt is on probation, but he has previous DWI charges and drug possession charges.  The pt denies hallucinations.  The pt stated he is not sleeping well and has a good appetite.  The pt reported he often feels depress, hopeless, and have negative thoughts about himself.  Diagnosis: F33.2 Major depressive disorder, Recurrent episode, Severe F11.20 Opioid use disorder, Severe F15.20 Amphetamine-type substance use disorder, Severe F14.20 Cocaine use disorder, Severe   Past Medical History:  Past Medical History:  Diagnosis Date  . Alcohol abuse   . Cocaine abuse ([place])   . Kidney  stones   . Narcotic abuse ([place])   . Peptic ulcer   . Polysubstance abuse ([place])   . Renal disorder    kidney stones    Past Surgical History:  Procedure Laterality Date  . ESOPHAGOGASTRODUODENOSCOPY N/A 05/29/2014   Procedure: ESOPHAGOGASTRODUODENOSCOPY (EGD) ;  Surgeon: Beryle Beams, MD;  Location: [facility] ENDOSCOPY;  Service: Endoscopy;  Laterality: N/A;  check with Dr. Collene Mares about sedation type/timinng - I recommend MAC  . HAND SURGERY  2006    Family History: No family history on file.  Social History:  reports that he has been smoking cigarettes.  He has been smoking about 0.50 packs per day. He has never used smokeless tobacco. He reports that he drinks alcohol. He reports that he has current or past drug history. Drugs: Marijuana, Cocaine, and IV.  Additional Social History:  Alcohol / Drug Use Pain Medications: See MAR Prescriptions: See MAR Over the Counter: See MAR History of alcohol / drug use?: Yes Longest period of sobriety (when/how long): 40 days Negative Consequences of Use: Financial, Legal, Personal relationships, Work / School Substance #1 Name of Substance 1: heroin 1 - Age of First Use: 18 1 - Amount (size/oz): unknown amount 1 - Frequency: daily 1 - Duration: 13 years 1 - Last Use / Amount: 03/24/18 Substance #2 Name of Substance 2: Crystal  meth 2 - Age of First Use: 26 2 - Amount (size/oz): 20-40 dollars worth 2 - Frequency: daily 2 - Duration: 3 months 2 - Last Use / Amount: 03/24/18 Substance #3 Name of Substance 3: cocaine 3 - Age of First Use: 29 3 - Amount (size/oz): unknown 3 - Frequency: a few times a week 3 - Duration: 2 years 3 - Last Use / Amount: pt stated April 2019  CIWA: CIWA-Ar BP: (!) 155/103 Pulse Rate: (!) 115 COWS: Clinical Opiate Withdrawal Scale (COWS) Resting Pulse Rate: Pulse Rate 81-100 Sweating: No report of chills or flushing Restlessness: Able to sit still Pupil Size: Pupils pinned or normal size for room light Bone or  Joint Aches: Not present Runny Nose or Tearing: Not present GI Upset: No GI symptoms Tremor: No tremor Yawning: No yawning Anxiety or Irritability: Patient reports increasing irritability or anxiousness Gooseflesh Skin: Skin is smooth COWS Total Score: 2  Allergies:  Allergies  Allergen Reactions  . Fish Allergy Anaphylaxis    Home Medications:  (Not in a hospital admission)  OB/GYN Status:  No LMP for male patient.  General Assessment Data Location of Assessment: [facility] ED TTS Assessment: In system Is this a Tele or Face-to-Face Assessment?: Tele Assessment Is this an Initial Assessment or a Re-assessment for this encounter?: Initial Assessment Marital status: Divorced [place] name: NA Is patient pregnant?: (male) Living Arrangements: Other (Comment)([facility]) Can pt return to current living arrangement?: Yes Admission Status: Voluntary Is patient capable of signing voluntary admission?: Yes Referral Source: Self/Family/Friend Insurance type: Self pay     Crisis Care Plan Living Arrangements: Other (Comment)([facility]) Legal Guardian: Other:(Self) Name of Psychiatrist: none Name of Therapist: none  Education Status Is patient currently in school?: No Is the patient employed, unemployed or receiving disability?: Employed  Risk to self with the past 6 months Suicidal Ideation: Yes-Currently Present Has patient been a risk to self within the past 6 months prior to admission? : Yes Suicidal Intent: Yes-Currently Present Has patient had any suicidal intent within the past 6 months prior to admission? : Yes Is patient at risk for suicide?: Yes Suicidal Plan?: Yes-Currently Present Has patient had any suicidal plan within the past 6 months prior to admission? : Yes Specify Current Suicidal Plan: overdose on heroin Access to Means: Yes Specify Access to Suicidal Means: pt can get heroin What has been your use of drugs/alcohol within the last 12 months?: heroin and  crystal meth useage Previous Attempts/Gestures: Yes How many times?: 6 Other Self Harm Risks: drug use Triggers for Past Attempts: Unpredictable Intentional Self Injurious Behavior: None Family Suicide History: Yes Recent stressful life event(s): Legal Issues, Financial Problems Persecutory voices/beliefs?: No Depression: Yes Depression Symptoms: Insomnia, Isolating, Loss of interest in usual pleasures, Feeling worthless/self pity Substance abuse history and/or treatment for substance abuse?: Yes Suicide prevention information given to non-admitted patients: Not applicable  Risk to Others within the past 6 months Homicidal Ideation: No Does patient have any lifetime risk of violence toward others beyond the six months prior to admission? : No Thoughts of Harm to Others: No Current Homicidal Intent: No Current Homicidal Plan: No Access to Homicidal Means: No Identified Victim: none History of harm to others?: No Assessment of Violence: None Noted Violent Behavior Description: none Does patient have access to weapons?: No Criminal Charges Pending?: No Does patient have a court date: Yes Court Date: 04/02/18 Is patient on probation?: Yes  Psychosis Hallucinations: None noted Delusions: None noted  Mental Status Report  Appearance/Hygiene: In scrubs, Unremarkable Eye Contact: Good Motor Activity: Freedom of movement, Unremarkable Speech: Logical/coherent, Rapid Level of Consciousness: Alert Mood: Depressed Affect: Depressed Anxiety Level: None Thought Processes: Coherent, Relevant Judgement: Impaired Orientation: Person, Place, Time, Situation, Appropriate for developmental age Obsessive Compulsive Thoughts/Behaviors: None  Cognitive Functioning Concentration: Normal Memory: Recent Intact, Remote Intact Is patient IDD: No Is patient DD?: No Insight: Poor Impulse Control: Poor Appetite: Good Have you had any weight changes? : No Change Sleep: Decreased Total Hours  of Sleep: 3 Vegetative Symptoms: None  ADLScreening [facility] Assessment Services) Patient's cognitive ability adequate to safely complete daily activities?: Yes Patient able to express need for assistance with ADLs?: Yes Independently performs ADLs?: Yes (appropriate for developmental age)  Prior Inpatient Therapy Prior Inpatient Therapy: Yes Prior Therapy Dates: 2019, 2018, 2017, 2016, 2015, 2014 Prior Therapy Facilty/Provider(s): [facility], [facility] and others Reason for Treatment: SA and depression  Prior Outpatient Therapy Prior Outpatient Therapy: Yes Prior Therapy Dates: 2019 Prior Therapy Facilty/Provider(s): [facility] Reason for Treatment: SA Does patient have an ACCT team?: No Does patient have Intensive In-House Services?  : No Does patient have [facility] services? : No Does patient have P4CC services?: No  ADL Screening (condition at time of admission) Patient's cognitive ability adequate to safely complete daily activities?: Yes Patient able to express need for assistance with ADLs?: Yes Independently performs ADLs?: Yes (appropriate for developmental age)       Abuse/Neglect Assessment (Assessment to be complete while patient is alone) Abuse/Neglect Assessment Can Be Completed: Yes Physical Abuse: Denies Verbal Abuse: Denies Sexual Abuse: Denies Exploitation of patient/patient's resources: Denies Self-Neglect: Denies Values / Beliefs Cultural Requests During Hospitalization: None Spiritual Requests During Hospitalization: None Consults Spiritual Care Consult Needed: No Social Work Consult Needed: No            Disposition:  Disposition Initial Assessment Completed for this Encounter: Yes   NP Lindon Romp recommends the pt be observed overnight for safety and stabilization.  The pt is to be reassessed by psychiatry in the AM.  RN Seth Bake and PA Antonietta Breach were made aware of the recommendation.  This service was provided  via telemedicine using a 2-way, interactive audio and video technology.  Names of all persons participating in this telemedicine service and their role in this encounter. Name: Dmonte Maher Role: Pt  Name: Virgina Organ Role: TTS  Name:  Role:   Name:  Role:     Enzo Montgomery 03/25/2018 3:42 AM

## 2018-03-25 NOTE — ED Notes (Signed)
Pt verbalized understanding discharge instructions and denies any further needs or questions at this time. VS stable, ambulatory and steady gait.   

## 2018-03-25 NOTE — ED Notes (Signed)
Pt asked to use phone to call new PO and let them know he is at [facility]. RN received Yellow folder from Pt's locker containing phone number.

## 2018-03-25 NOTE — Progress Notes (Signed)
Pt accepted to [facility], Bed 302-2  Shuvon Rankin, NP is the accepting provider.  Dr. Mallie Darting is the attending provider.  Call report to [phone]  [email or web address]  Psych ED   Pt is Voluntary.  Pt may be transported by [facility]  Pt scheduled  to arrive at [facility] @1600   Romie Minus T. Judi Cong, MSW, [place] Disposition Clinical Social Work [phone] (cell) [phone] (office)

## 2018-03-25 NOTE — ED Notes (Signed)
Brian [name] at [facility] called this RN. Pt has bed at [facility] 302-2. Bed will ready at 1600. Dr. Mallie Darting is attending, Genella Mech NP.

## 2018-03-25 NOTE — Tx Team (Signed)
Initial Treatment Plan 03/25/2018 6:56 PM Brian [name] BOF:[id number]    PATIENT STRESSORS: Financial difficulties Legal issue Marital or family conflict Medication change or noncompliance Substance abuse   PATIENT STRENGTHS: Ability for insight Average or above average intelligence Communication skills General fund of knowledge Motivation for treatment/growth Physical Health Supportive family/friends Work skills   PATIENT IDENTIFIED PROBLEMS: "Getting medications straight"  Fix relationships with family/friends  "I need to forgive my family."                 DISCHARGE CRITERIA:  Ability to meet basic life and health needs Adequate post-discharge living arrangements Improved stabilization in mood, thinking, and/or behavior Reduction of life-threatening or endangering symptoms to within safe limits Verbal commitment to aftercare and medication compliance Withdrawal symptoms are absent or subacute and managed without 24-hour nursing intervention  PRELIMINARY DISCHARGE PLAN: Placement in alternative living arrangements  PATIENT/FAMILY INVOLVEMENT: This treatment plan has been presented to and reviewed with the patient, Brian [name].  The patient and family have been given the opportunity to ask questions and make suggestions.  Megan Mans, RN 03/25/2018, 6:56 PM

## 2018-03-25 NOTE — ED Notes (Signed)
End of TTS consult. Sitter remains at bedside

## 2018-03-25 NOTE — ED Notes (Signed)
Report Given and [facility] called

## 2018-03-25 NOTE — ED Notes (Signed)
Recommendation is to be reassessed by psychiatrist in AM.

## 2018-03-25 NOTE — Progress Notes (Signed)
Nursing Progress Note: 7p-7a D: Pt currently presents with a depressed/anxious/worried affect and behavior. Pt states "I fucked up. I never do meth. Now, it's all I do. I messed everything up. I losty my job and my wife. I have no reason to live." Interacting appropriately with the milieu. Pt reports good sleep during the previous night with current medication regimen. Pt did attend wrap-up group.  A: Pt provided with medications per providers orders. Pt's labs and vitals were monitored throughout the night. Pt supported emotionally and encouraged to express concerns and questions. Pt educated on medications.  R: Pt's safety ensured with 15 minute and environmental checks. Pt currently denies HI and AVH and endorses passive SI. Pt verbally contracts to seek staff if SI,HI, or AVH occurs and to consult with staff before acting on any harmful thoughts. Will continue to monitor.

## 2018-03-25 NOTE — Progress Notes (Addendum)
Pt assessed and meets inpatient criteria per Physician Extender, [place] Rankin.    [facility] will review but currently does not have an appropriate bed. Pt referrals sent to the following:  [facility]  [facility]  [facility]  [facility]        Disposition CSWs will continue to follow for placement.  Areatha Keas. Judi Cong, MSW, [place] Disposition Clinical Social Work [phone] (cell) [phone] (office)

## 2018-03-26 DIAGNOSIS — F1994 Other psychoactive substance use, unspecified with psychoactive substance-induced mood disorder: Secondary | ICD-10-CM

## 2018-03-26 DIAGNOSIS — F1120 Opioid dependence, uncomplicated: Secondary | ICD-10-CM

## 2018-03-26 DIAGNOSIS — F1520 Other stimulant dependence, uncomplicated: Secondary | ICD-10-CM

## 2018-03-26 DIAGNOSIS — R45851 Suicidal ideations: Secondary | ICD-10-CM

## 2018-03-26 DIAGNOSIS — F17210 Nicotine dependence, cigarettes, uncomplicated: Secondary | ICD-10-CM

## 2018-03-26 DIAGNOSIS — G4700 Insomnia, unspecified: Secondary | ICD-10-CM

## 2018-03-26 DIAGNOSIS — F3181 Bipolar II disorder: Principal | ICD-10-CM

## 2018-03-26 MED ORDER — OLANZAPINE 10 MG PO TABS
10.0000 mg | ORAL_TABLET | Freq: Every day | ORAL | Status: DC
  Administered 2018-03-26 – 2018-03-31 (×6): 10 mg via ORAL
  Filled 2018-03-26: qty 1
  Filled 2018-03-26: qty 7
  Filled 2018-03-26 (×3): qty 1
  Filled 2018-03-26: qty 7
  Filled 2018-03-26 (×3): qty 1

## 2018-03-26 MED ORDER — MIRTAZAPINE 15 MG PO TABS
15.0000 mg | ORAL_TABLET | Freq: Every day | ORAL | Status: DC
  Administered 2018-03-26 – 2018-03-28 (×3): 15 mg via ORAL
  Filled 2018-03-26 (×4): qty 1

## 2018-03-26 MED ORDER — TRAZODONE HCL 50 MG PO TABS
50.0000 mg | ORAL_TABLET | Freq: Every evening | ORAL | Status: DC | PRN
  Administered 2018-03-28 – 2018-03-31 (×4): 50 mg via ORAL
  Filled 2018-03-26 (×4): qty 1
  Filled 2018-03-26: qty 7

## 2018-03-26 NOTE — Progress Notes (Signed)
Recreation Therapy Notes  Date: 5.31.19 Time: 0930 Location: 400 Hall Dayroom  Group Topic: Stress Management  Goal Area(s) Addresses:  Patient will verbalize importance of using healthy stress management.  Patient will identify positive emotions associated with healthy stress management.   Intervention: Stress Management  Activity :  Progressive Muscle Relaxation.  LRT lead group in progressive muscle relaxation.  Patients were to tense each muscle one at a time and then release the tension.  Patients were to follow along as LRT lead them through the activity.    Education:  Stress Management, Discharge Planning.   Education Outcome: Acknowledges edcuation/In group clarification offered/Needs additional education  Clinical Observations/Feedback:  Pt did not attend group.      Victorino Sparrow, LRT/CTRS         Victorino Sparrow A 03/26/2018 11:25 AM
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
## 2018-03-26 NOTE — [facility] Group Notes (Signed)
[facility] Group Notes:  (Nursing/MHT/Case Management/Adjunct)  Date:  03/26/2018  Time:  5:27 PM   Type of Therapy:  Psychoeducational Skills  Participation Level:  Did Not Attend  Participation Quality:  DID NOT ATTEND  Affect:  DID NOT ATTEND  Cognitive:  DID NOT ATTEND  Insight:  None  Engagement in Group:  DID NOT ATTEND  Modes of Intervention:  DID NOT ATTEND  Summary of Progress/Problems: Pt did not attend patient self inventory group.    [name] 03/26/2018, 5:27 PM

## 2018-03-26 NOTE — Progress Notes (Signed)
DAR NOTE: Pt present with flat affect and depressed mood in the unit. Pt has been isolating himself. Pt denies physical pain, took all his meds as scheduled. As per self inventory, pt had a fair night sleep, fair appetite, low energy, and poor concentration. Pt rate depression at 8, hopeless ness at 7, and anxiety at 8. Pt's safety ensured with 15 minute and environmental checks. Pt currently denies SI/HI and A/V hallucinations. Pt verbally agrees to seek staff if SI/HI or A/VH occurs and to consult with staff before acting on these thoughts. Will continue POC.

## 2018-03-26 NOTE — Progress Notes (Signed)
Nutrition Brief Note  Patient identified on the Malnutrition Screening Tool (MST) Report  Wt Readings from Last 15 Encounters:  03/25/18 165 lb (74.8 kg)  03/24/18 165 lb (74.8 kg)  03/23/18 160 lb (72.6 kg)  05/25/15 140 lb (63.5 kg)  05/20/15 140 lb (63.5 kg)  05/19/15 145 lb (65.8 kg)  03/25/15 150 lb (68 kg)  03/24/15 150 lb (68 kg)  03/10/15 143 lb 8 oz (65.1 kg)  03/08/15 155 lb (70.3 kg)  01/01/15 160 lb (72.6 kg)  12/26/14 160 lb (72.6 kg)  11/02/14 160 lb (72.6 kg)  05/28/14 145 lb (65.8 kg)  05/02/14 155 lb (70.3 kg)    Body mass index is 25.09 kg/m. Patient meets criteria for overweight based on current BMI. Pt admitted for SI.  Current diet order is Regular, patient is eating as desired for meals and snacks at this time. Labs and medications reviewed.   No nutrition interventions warranted at this time. If nutrition issues arise, please consult RD.     Jarome Matin, MS, RD, LDN, [facility] Inpatient Clinical Dietitian Pager # [phone] After hours/weekend pager # [phone]

## 2018-03-26 NOTE — [facility] Group Notes (Signed)
LCSW Group Therapy Note 03/26/2018 2:04 PM  Type of Therapy and Topic: Group Therapy: Avoiding Self-Sabotaging and Enabling Behaviors  Participation Level: Did Not Attend  Description of Group:  In this group, patients will learn how to identify obstacles, self-sabotaging and enabling behaviors, as well as: what are they, why do we do them and what needs these behaviors meet. Discuss unhealthy relationships and how to have positive healthy boundaries with those that sabotage and enable. Explore aspects of self-sabotage and enabling in yourself and how to limit these self-destructive behaviors in everyday life.  Therapeutic Goals: 1. Patient will identify one obstacle that relates to self-sabotage and enabling behaviors 2. Patient will identify one personal self-sabotaging or enabling behavior they did prior to admission 3. Patient will state a plan to change the above identified behavior 4. Patient will demonstrate ability to communicate their needs through discussion and/or role play.   Summary of Patient Progress:  Invited, chose not to attend.    Therapeutic Modalities:  Cognitive Behavioral Therapy Person-Centered Therapy Motivational Interviewing   [place] Clinical Social Worker

## 2018-03-26 NOTE — Tx Team (Addendum)
Interdisciplinary Treatment and Diagnostic Plan Update  03/26/2018 Time of Session: [place] MRN: [id number]  Principal Diagnosis: MDD, recurrent, severe  Secondary Diagnoses: Active Problems:   MDD (major depressive disorder), severe (HCC)   Current Medications:  Current Facility-Administered Medications  Medication Dose Route Frequency Provider Last Rate Last Dose  . buPROPion (WELLBUTRIN XL) 24 hr tablet 150 mg  150 mg Oral Daily Rankin, Shuvon B, NP   150 mg at 03/26/18 0752  . famotidine (PEPCID) tablet 20 mg  20 mg Oral BID Rankin, Shuvon B, NP      . gabapentin (NEURONTIN) capsule 600 mg  600 mg Oral TID Rankin, Shuvon B, NP   600 mg at 03/26/18 0752  . hydrOXYzine (ATARAX/VISTARIL) tablet 25 mg  25 mg Oral Q6H Rankin, Shuvon B, NP   25 mg at 03/26/18 0754  . nicotine (NICODERM CQ - dosed in mg/24 hours) patch 21 mg  21 mg Transdermal Daily Rankin, Shuvon B, NP   21 mg at 03/26/18 0752  . traZODone (DESYREL) tablet 150 mg  150 mg Oral QHS PRN Rankin, Shuvon B, NP   150 mg at 03/25/18 2152   PTA Medications: Medications Prior to Admission  Medication Sig Dispense Refill Last Dose  . buPROPion (WELLBUTRIN XL) 150 MG 24 hr tablet Take 150 mg by mouth daily.   Past Month at Unknown time  . cephALEXin (KEFLEX) 500 MG capsule Take 1 capsule (500 mg total) by mouth 4 (four) times daily. (Patient not taking: Reported on 03/25/2018) 28 capsule 0 Completed Course at Unknown time  . famotidine (PEPCID) 20 MG tablet Take 1 tablet (20 mg total) by mouth 2 (two) times daily. 30 tablet 0   . gabapentin (NEURONTIN) 600 MG tablet Take 600 mg by mouth 3 (three) times daily.   Past Month at Unknown time  . hydrOXYzine (ATARAX/VISTARIL) 25 MG tablet Take 1 tablet (25 mg total) by mouth every 6 (six) hours. 12 tablet 0   . ibuprofen (ADVIL,MOTRIN) 600 MG tablet Take 1 tablet (600 mg total) by mouth every 6 (six) hours as needed. (Patient not taking: Reported on 05/20/2015) 30 tablet 0  Completed Course at Unknown time  . predniSONE (STERAPRED UNI-PAK 21 TAB) 10 MG (21) TBPK tablet Take by mouth daily. Take 6 tabs by mouth daily  for 2 days, then 5 tabs for 2 days, then 4 tabs for 2 days, then 3 tabs for 2 days, 2 tabs for 2 days, then 1 tab by mouth daily for 2 days 42 tablet 0   . traZODone (DESYREL) 150 MG tablet Take 150 mg by mouth at bedtime as needed.   Past Month at Unknown time    Patient Stressors: Financial difficulties Legal issue Marital or family conflict Medication change or noncompliance Substance abuse  Patient Strengths: Ability for insight Average or above average intelligence Communication skills General fund of knowledge Motivation for treatment/growth Physical Health Supportive family/friends Work skills  Treatment Modalities: Medication Management, Group therapy, Case management,  1 to 1 session with clinician, Psychoeducation, Recreational therapy.   Physician Treatment Plan for Primary Diagnosis: MDD, recurrent, severe  Medication Management: Evaluate patient's response, side effects, and tolerance of medication regimen.  Therapeutic Interventions: 1 to 1 sessions, Unit Group sessions and Medication administration.  Evaluation of Outcomes: Not Met  Physician Treatment Plan for Secondary Diagnosis: Active Problems:   MDD (major depressive disorder), severe ([place])  Medication Management: Evaluate patient's response, side effects, and tolerance of medication regimen.  Therapeutic Interventions:  1 to 1 sessions, Unit Group sessions and Medication administration.  Evaluation of Outcomes: Not Met   RN Treatment Plan for Primary Diagnosis: MDD, recurrent, severe Long Term Goal(s): Knowledge of disease and therapeutic regimen to maintain health will improve  Short Term Goals: Ability to remain free from injury will improve, Ability to demonstrate self-control, Ability to disclose and discuss suicidal ideas and Ability to identify and  develop effective coping behaviors will improve  Medication Management: RN will administer medications as ordered by provider, will assess and evaluate patient's response and provide education to patient for prescribed medication. RN will report any adverse and/or side effects to prescribing provider.  Therapeutic Interventions: 1 on 1 counseling sessions, Psychoeducation, Medication administration, Evaluate responses to treatment, Monitor vital signs and CBGs as ordered, Perform/monitor CIWA, COWS, AIMS and Fall Risk screenings as ordered, Perform wound care treatments as ordered.  Evaluation of Outcomes: Not Met   LCSW Treatment Plan for Primary Diagnosis: MDD, recurrent, severe Long Term Goal(s): Safe transition to appropriate next level of care at discharge, Engage patient in therapeutic group addressing interpersonal concerns.  Short Term Goals: Engage patient in aftercare planning with referrals and resources, Facilitate patient progression through stages of change regarding substance use diagnoses and concerns and Identify triggers associated with mental health/substance abuse issues  Therapeutic Interventions: Assess for all discharge needs, 1 to 1 time with Social worker, Explore available resources and support systems, Assess for adequacy in community support network, Educate family and significant other(s) on suicide prevention, Complete Psychosocial Assessment, Interpersonal group therapy.  Evaluation of Outcomes: Not Met   Progress in Treatment: Attending groups: No. New to unit. Continuing to assess.  Participating in groups: No. Taking medication as prescribed: Yes. Toleration medication: Yes. Family/Significant other contact made: No, will contact:  family member if patient consents to collateral contact.  Patient understands diagnosis: Yes. Discussing patient identified problems/goals with staff: Yes. Medical problems stabilized or resolved: Yes. Denies suicidal/homicidal  ideation: Yes. Issues/concerns per patient self-inventory: No. Other: n/a   New problem(s) identified: No, Describe:  n/a  New Short Term/Long Term Goal(s): detox, medication management for mood stabilization; elimination of SI thoughts; development of comprehensive mental wellness/sobriety plan.   Patient Goals:  "try to get stable. To get on meds to help with my depression."   Discharge Plan or Barriers: CSW assessing for appropriate referrals. He lives at [facility] in [place] currently. No current outpatient providers. [place] pamphlet, [facility] Crisis information, and AA/NA information provided to patient for additional community support and resources.   Reason for Continuation of Hospitalization: Anxiety Depression Medication stabilization Suicidal ideation Withdrawal symptoms  Estimated Length of Stay: Tuesday, 03/30/18  Attendees: Patient: Brian [name] 03/26/2018 8:07 AM  Physician: Dr. Parke Poisson MD; Dr. Nancy Fetter MD 03/26/2018 8:07 AM  Nursing: Opal Sidles RN; [place] RN 03/26/2018 8:07 AM  RN Care Manager:x 03/26/2018 8:07 AM  Social Worker: Janice Norrie LCSW 03/26/2018 8:07 AM  Recreational Therapist: x 03/26/2018 8:07 AM  Other: Marvia Pickles NP; Lindell Spar NP 03/26/2018 8:07 AM  Other:  03/26/2018 8:07 AM  Other: 03/26/2018 8:07 AM    Scribe for Treatment Team: Avelina Laine, LCSW 03/26/2018 8:07 AM

## 2018-03-26 NOTE — H&P (Addendum)
Psychiatric Admission Assessment Adult  Patient Identification: Brian [name]  MRN:  [id number]  Date of Evaluation:  03/26/2018  Chief Complaint: Suicidal ideations & drug relapse   Principal Diagnosis: Opioid use disorder, severe, dependence ([place])  Diagnosis:   Patient Active Problem List   Diagnosis Date Noted  . Substance induced mood disorder ([place]) [F19.94] 03/26/2018    Priority: High  . Opioid use disorder, severe, dependence ([place]) [F11.20] 03/08/2015    Priority: High  . Amphetamine use disorder, severe, dependence ([place]) [F15.20] 03/26/2018    Priority: Medium  . MDD (major depressive disorder), severe ([place]) [F32.2] 03/25/2018  . Cellulitis [L03.90] 05/20/2015  . Dysuria [R30.0] 05/20/2015  . Smoker [F17.200] 05/20/2015  . Homeless single person [Z59.0] 05/20/2015  . Drug abuse ([place]) [F19.10]   . Ileus of unspecified type ([place]) [K56.7] 05/28/2014  . Abdominal pain [R10.9] 05/28/2014  . AKI (acute kidney injury) ([place]) [N17.9] 05/28/2014  . Esophagitis, acute [K20.9] 05/28/2014  . Polysubstance abuse ([place]) [F19.10] 05/28/2014  . Hematemesis [K92.0] 05/28/2014   History of Present Illness: This is an admission assessment for this 32 year old Caucasian male with hx of polysubstance use disorder & Bipolar disorder, type- 2. Admitted to the [facility] from the [facility] with complaints of suicdal ideations triggered by recent relapse on drugs. His UDS upon admission at the ED was positive for Amphetamine, opioid & Cocaine. Chart review indicated that patient reported was sober x 90 days, then relapsed 2 days ago.  During this assessment, Brian [name] reports, "I went to the [facility] ED 2 days ago. I was feeling very upset with myself because I relapsed on drugs & got very depressed. I have been living in an [facility] for about a month. However, 2 days ago, I had a shot of heroin & Methamphetamine. I started using Meth about a year ago. I have been abusing opioid  for 13 years. I used to think that I was depressed because of my drug use, I now realized that I have been depressed all along, worsened since 2015 after my wife left me. My family disowned me as well, even though my parents introduced me to drugs at the age of 89. I used a lot of drugs throughout high school, mostly, weed & opioid pills. I drank a lot of alcohol as well. I hold a lot of resentment towards my parents. They are clean from drugs as of date. I need to adjust my medicines for my depression. I will need counseling services after discharge. I was diagnosed with bipolar disorder, type-2 in 2016. I have been tried on a lot of medicines that included; Zyprexa, Latuda, Sertraline, Depakote etc. Each time that I get on medications, I will stop once I start to feel better. I benefited from [place] the most. The others did not help".   Associated Signs/Symptoms:  Depression Symptoms:  depressed mood, insomnia, anxiety,  (Hypo) Manic Symptoms:  Impulsivity, Labiality of Mood,  Anxiety Symptoms:  Excessive Worry,  Psychotic Symptoms:  Denies any hallucinations, delusions or paranoia.  PTSD Symptoms: Denies  Total Time spent with patient: 1 hour  Past Psychiatric History: Bipolar disorder - 2, Opioid use disorder, Amphetamine use disorder, Cocaine use disorder.  Is the patient at risk to self? No.  Has the patient been a risk to self in the past 6 months? No.  Has the patient been a risk to self within the distant past? Yes.    Is the patient a risk  to others? No.  Has the patient been a risk to others in the past 6 months? No.  Has the patient been a risk to others within the distant past? No.   Prior Inpatient Therapy: Yes, ([place], [place])  Prior Outpatient Therapy: Yes.  Alcohol Screening: 1. How often do you have a drink containing alcohol?: Never 2. How many drinks containing alcohol do you have on a typical day when you are drinking?: 1 or 2 3. How  often do you have six or more drinks on one occasion?: Never AUDIT-C Score: 0 4. How often during the last year have you found that you were not able to stop drinking once you had started?: Never 5. How often during the last year have you failed to do what was normally expected from you becasue of drinking?: Never 6. How often during the last year have you needed a first drink in the morning to get yourself going after a heavy drinking session?: Never 7. How often during the last year have you had a feeling of guilt of remorse after drinking?: Never 8. How often during the last year have you been unable to remember what happened the night before because you had been drinking?: Never 9. Have you or someone else been injured as a result of your drinking?: No 10. Has a relative or friend or a doctor or another health worker been concerned about your drinking or suggested you cut down?: No Alcohol Use Disorder Identification Test Final Score (AUDIT): 0 Intervention/Follow-up: Brief Advice  Substance Abuse History in the last 12 months:  Yes.    Consequences of Substance Abuse: Medical Consequences:  Liver damage, Possible death by overdose Legal Consequences:  Arrests, jail time, Loss of driving privilege. Family Consequences:  Family discord, divorce and or separation.  Previous Psychotropic Medications: Yes, (Zyprexa, Latuda, Depakote, Sertraline etc).  Psychological Evaluations: Yes   Past Medical History:  Past Medical History:  Diagnosis Date  . Alcohol abuse   . Anxiety   . Cocaine abuse ([place])   . Depression   . Kidney stones   . Narcotic abuse ([place])   . Peptic ulcer   . Polysubstance abuse ([place])   . Renal disorder    kidney stones    Past Surgical History:  Procedure Laterality Date  . ESOPHAGOGASTRODUODENOSCOPY N/A 05/29/2014   Procedure: ESOPHAGOGASTRODUODENOSCOPY (EGD) ;  Surgeon: Beryle Beams, MD;  Location: [facility] ENDOSCOPY;  Service: Endoscopy;  Laterality: N/A;  check  with Dr. Collene Mares about sedation type/timinng - I recommend MAC  . HAND SURGERY  2006   Family History: History reviewed. No pertinent family history.  Family Psychiatric  History: Mental illness: Daughter  Tobacco Screening: Have you used any form of tobacco in the last 30 days? (Cigarettes, Smokeless Tobacco, Cigars, and/or Pipes): Yes Tobacco use, Select all that apply: 5 or more cigarettes per day Are you interested in Tobacco Cessation Medications?: No, patient refused Counseled patient on smoking cessation including recognizing danger situations, developing coping skills and basic information about quitting provided: Refused/Declined practical counseling  Social History:  Social History   Substance and Sexual Activity  Alcohol Use Not Currently     Social History   Substance and Sexual Activity  Drug Use Yes  . Types: Marijuana, Cocaine, IV   Comment: OPIATES    Additional Social History: Marital status: Divorced Divorced, when?: 2015 What types of issues is patient dealing with in the relationship?: drug use "she also cheated on me with  my best friend." We were married for about 8 years.  Additional relationship information: n/a Are you sexually active?: No What is your sexual orientation?: heterosexual Has your sexual activity been affected by drugs, alcohol, medication, or emotional stress?: n/a Does patient have children?: Yes How many children?: 1 How is patient's relationship with their children?: Five year son-"he lives with my exwife." haven't really been present.   Allergies:   Allergies  Allergen Reactions  . Fish Allergy Anaphylaxis   Lab Results:  Results for orders placed or performed during the hospital encounter of 03/24/18 (from the past 48 hour(s))  Rapid urine drug screen (hospital performed)     Status: Abnormal   Collection Time: 03/24/18 11:56 PM  Result Value Ref Range   Opiates POSITIVE (A) NONE DETECTED   Cocaine POSITIVE (A) NONE DETECTED    Benzodiazepines NONE DETECTED NONE DETECTED   Amphetamines POSITIVE (A) NONE DETECTED   Tetrahydrocannabinol NONE DETECTED NONE DETECTED   Barbiturates NONE DETECTED NONE DETECTED    Comment: (NOTE) DRUG SCREEN FOR MEDICAL PURPOSES ONLY.  IF CONFIRMATION IS NEEDED FOR ANY PURPOSE, NOTIFY LAB WITHIN 5 DAYS. LOWEST DETECTABLE LIMITS FOR URINE DRUG SCREEN Drug Class                     Cutoff (ng/mL) Amphetamine and metabolites    1000 Barbiturate and metabolites    200 Benzodiazepine                 818 Tricyclics and metabolites     300 Opiates and metabolites        300 Cocaine and metabolites        300 THC                            50 Performed at [facility], [place] [address] [place], [place] [address]   Comprehensive metabolic panel     Status: Abnormal   Collection Time: 03/25/18 12:01 AM  Result Value Ref Range   Sodium 138 135 - 145 mmol/L   Potassium 4.1 3.5 - 5.1 mmol/L   Chloride 104 101 - 111 mmol/L   CO2 22 22 - 32 mmol/L   Glucose, Bld 123 (H) 65 - 99 mg/dL   BUN 11 6 - 20 mg/dL   Creatinine, Ser 1.31 (H) 0.61 - 1.24 mg/dL   Calcium 9.5 8.9 - 10.3 mg/dL   Total Protein 8.2 (H) 6.5 - 8.1 g/dL   Albumin 4.2 3.5 - 5.0 g/dL   AST 37 15 - 41 U/L   ALT 38 17 - 63 U/L   Alkaline Phosphatase 88 38 - 126 U/L   Total Bilirubin 0.5 0.3 - 1.2 mg/dL   GFR calc non Af Amer >60 >60 mL/min   GFR calc Af Amer >60 >60 mL/min    Comment: (NOTE) The eGFR has been calculated using the CKD EPI equation. This calculation has not been validated in all clinical situations. eGFR's persistently <60 mL/min signify possible Chronic Kidney Disease.    Anion gap 12 5 - 15    Comment: Performed at [facility] [address] [place], [place] [address]  Ethanol     Status: None   Collection Time: 03/25/18 12:01 AM  Result Value Ref Range   Alcohol, Ethyl (B) <10 <10 mg/dL    Comment: (NOTE) Lowest detectable limit for serum alcohol is 10 mg/dL. For medical purposes  only. Performed  at [facility], [place] [address] [place], [place] [address]   Salicylate level     Status: None   Collection Time: 03/25/18 12:01 AM  Result Value Ref Range   Salicylate Lvl <5.6 2.8 - 30.0 mg/dL    Comment: Performed at [facility] [address], [place], [place] [address]  Acetaminophen level     Status: Abnormal   Collection Time: 03/25/18 12:01 AM  Result Value Ref Range   Acetaminophen (Tylenol), Serum <10 (L) 10 - 30 ug/mL    Comment: (NOTE) Therapeutic concentrations vary significantly. A range of 10-30 ug/mL  may be an effective concentration for many patients. However, some  are best treated at concentrations outside of this range. Acetaminophen concentrations >150 ug/mL at 4 hours after ingestion  and >50 ug/mL at 12 hours after ingestion are often associated with  toxic reactions. Performed at [facility], [place] [address] [place], [place] [address]   cbc     Status: Abnormal   Collection Time: 03/25/18 12:01 AM  Result Value Ref Range   WBC 11.3 (H) 4.0 - 10.5 K/uL   RBC 4.89 4.22 - 5.81 MIL/uL   Hemoglobin 13.5 13.0 - 17.0 g/dL   HCT 42.9 39.0 - 52.0 %   MCV 87.7 78.0 - 100.0 fL   MCH 27.6 26.0 - 34.0 pg   MCHC 31.5 30.0 - 36.0 g/dL   RDW 14.5 11.5 - 15.5 %   Platelets 265 150 - 400 K/uL    Comment: Performed at [facility], [place] [address] [place], [place] [address]   Blood Alcohol level:  Lab Results  Component Value Date   ETH <10 03/25/2018   ETH <5 11/57/2620   Metabolic Disorder Labs:  No results found for: HGBA1C, MPG No results found for: PROLACTIN No results found for: CHOL, TRIG, HDL, CHOLHDL, VLDL, LDLCALC  Current Medications: Current Facility-Administered Medications  Medication Dose Route Frequency Provider Last Rate Last Dose  . buPROPion (WELLBUTRIN XL) 24 hr tablet 150 mg  150 mg Oral Daily Rankin, Shuvon B, NP   150 mg at 03/26/18 0752  . famotidine (PEPCID) tablet 20 mg  20 mg Oral BID  Rankin, Shuvon B, NP      . gabapentin (NEURONTIN) capsule 600 mg  600 mg Oral TID Rankin, Shuvon B, NP   600 mg at 03/26/18 1207  . hydrOXYzine (ATARAX/VISTARIL) tablet 25 mg  25 mg Oral Q6H Rankin, Shuvon B, NP   25 mg at 03/26/18 0754  . nicotine (NICODERM CQ - dosed in mg/24 hours) patch 21 mg  21 mg Transdermal Daily Rankin, Shuvon B, NP   21 mg at 03/26/18 0752  . traZODone (DESYREL) tablet 150 mg  150 mg Oral QHS PRN Rankin, Shuvon B, NP   150 mg at 03/25/18 2152   PTA Medications: Medications Prior to Admission  Medication Sig Dispense Refill Last Dose  . buPROPion (WELLBUTRIN XL) 150 MG 24 hr tablet Take 150 mg by mouth daily.   Past Month at Unknown time  . cephALEXin (KEFLEX) 500 MG capsule Take 1 capsule (500 mg total) by mouth 4 (four) times daily. (Patient not taking: Reported on 03/25/2018) 28 capsule 0 Completed Course at Unknown time  . famotidine (PEPCID) 20 MG tablet Take 1 tablet (20 mg total) by mouth 2 (two) times daily. 30 tablet 0   . gabapentin (NEURONTIN) 600 MG tablet Take 600 mg by mouth 3 (three) times daily.   Past  Month at Unknown time  . hydrOXYzine (ATARAX/VISTARIL) 25 MG tablet Take 1 tablet (25 mg total) by mouth every 6 (six) hours. 12 tablet 0   . ibuprofen (ADVIL,MOTRIN) 600 MG tablet Take 1 tablet (600 mg total) by mouth every 6 (six) hours as needed. (Patient not taking: Reported on 05/20/2015) 30 tablet 0 Completed Course at Unknown time  . predniSONE (STERAPRED UNI-PAK 21 TAB) 10 MG (21) TBPK tablet Take by mouth daily. Take 6 tabs by mouth daily  for 2 days, then 5 tabs for 2 days, then 4 tabs for 2 days, then 3 tabs for 2 days, 2 tabs for 2 days, then 1 tab by mouth daily for 2 days 42 tablet 0   . traZODone (DESYREL) 150 MG tablet Take 150 mg by mouth at bedtime as needed.   Past Month at Unknown time   Musculoskeletal: Strength & Muscle Tone: within normal limits Gait & Station: normal Patient leans: N/A  Psychiatric Specialty Exam: Physical Exam   Constitutional: He appears well-developed.  HENT:  Head: Normocephalic.  Eyes: Pupils are equal, round, and reactive to light.  Neck: Normal range of motion.  Cardiovascular:  Elevated pulse rate  Respiratory: Effort normal.  GI: Soft.  Genitourinary:  Genitourinary Comments: Deferred  Musculoskeletal: Normal range of motion.  Neurological: He is alert.  Skin: Skin is warm.    Review of Systems  Constitutional: Negative.   HENT: Negative.   Eyes: Negative.   Respiratory: Negative.   Cardiovascular: Negative.        Elevated pulse rate: 113  Gastrointestinal: Negative for heartburn.  Genitourinary: Negative.  Negative for urgency ( ).  Musculoskeletal: Negative.   Skin: Negative.   Neurological: Negative.   Endo/Heme/Allergies: Negative.   Psychiatric/Behavioral: Positive for depression and substance abuse (UDS (+) for Amphetamine, Opioid T Cocaine). Negative for hallucinations and memory loss. The patient is nervous/anxious and has insomnia.     Blood pressure 114/81, pulse (!) 113, temperature 99 F (37.2 C), temperature source Oral, resp. rate 18, height _0  (1.727 m), weight 74.8 kg (165 lb), SpO2 98 %.Body mass index is 25.09 kg/m.  General Appearance: Casual and Fairly Groomed  Eye Contact:  Good  Speech:  Clear and Coherent and Pressured  Volume:  Increased  Mood:  Anxious and Depressed  Affect:  Full Range  Thought Process:  Coherent, Goal Directed and Descriptions of Associations: Intact  Orientation:  Full (Time, Place, and Person)  Thought Content:  Rumination, denies any hallucinations, delusions or paranoia.  Suicidal Thoughts:  Yes.  without intent/plan, passively, able to contract for safety.  Homicidal Thoughts:  Denies  Memory:  Immediate;   Good Recent;   Good Remote;   Good  Judgement:  Fair  Insight:  Fair  Psychomotor Activity:  Increased  Concentration:  Concentration: Fair and Attention Span: Fair  Recall:  Good  Fund of Knowledge:  Fair   Language:  Good  Akathisia:  Negative  Handed:  Right  AIMS (if indicated):     Assets:  Communication Skills Desire for Improvement Physical Health  ADL's:  Intact  Cognition:  WNL  Sleep:  Number of Hours: 6.25   Treatment Plan/Recommendations: 1. Admit for crisis management and stabilization, estimated length of stay 3-5 days.   2. Medication management to reduce current symptoms to base line and improve the patient's overall level of functioning: See MAR, Md's SRA & treatment plan.   Observation Level/Precautions:  15 minute checks  Laboratory:  Per ED, UDS (+)  for Amphetamine, opioid & Cocaine  Psychotherapy: Group therapy    Medications:  See MAR  Consultations: As needed.    Discharge Concerns: Safety, mood stability   Estimated LOS: 2-4 days   Other: Admit to the [facility].   Physician Treatment Plan for Primary Diagnosis: Opioid use disorder, severe, dependence ([place])  Long Term Goal(s): Improvement in symptoms so as ready for discharge  Short Term Goals: Ability to identify changes in lifestyle to reduce recurrence of condition will improve and Ability to disclose and discuss suicidal ideas  Physician Treatment Plan for Secondary Diagnosis: Principal Problem:   Opioid use disorder, severe, dependence ([place]) Active Problems:   Substance induced mood disorder (H[facility])   Amphetamine use disorder, severe, dependence ([place])   MDD (major depressive disorder), severe ([place])  Long Term Goal(s): Improvement in symptoms so as ready for discharge  Short Term Goals: Ability to identify and develop effective coping behaviors will improve, Compliance with prescribed medications will improve and Ability to identify triggers associated with substance abuse/mental health issues will improve  I certify that inpatient services furnished can reasonably be expected to improve the patient's condition.    Lindell Spar, NP, PMHNP, FNP-BC 5/31/20191:01 PM   I have reviewed NP's Note,  assessement, diagnosis and plan, and agree. I have also met with patient and completed suicide risk assessment.  Brian [name] is a 32 y/o M with history of Bipolar II and polysubstance abuse who was [facility] from [facility] with worsening depression, SI with plan to overdose on heroin and recent attempt, and recent relapse on multiple substances including amphetamines, opiates, and cocaine. Pt was medically cleared and then transferred to [facility] for additional treatment.  Upon initial interview, pt shares, "A long while ago I got hooked on pain pills, and in 2015 when my wife left, I started heroin. I got clear for a while when I moved out to [place] and got on methadone, but in February I started using meth and I had gotten clean about 1 month ago and got to an [facility], but I thought the depression would go away and it didn't and I relapsed again. I decided to tell them at the [facility] and they kicked me out for at least 14 days. Then my depression got worse and I was trying to overdose, but I decided to get help." Pt reports depression symptoms of anhedonia, guilty feelings, low energy, poor concentration, poor appetite, and hopelessness. He reports history of hypomanic episodes but he denies current hypomanic/manic symptoms. He denies symptoms of AH/VH/HI. He denies symptoms of OCD and PTSD. He reports using meth, cocaine, and heroin in last week since relapse.  Discussed with patient about treatment options. He is currently taking no medications outside the hospital. He reports only medications that have worked for him in the past as zyprexa and remeron. He is in agreement to be restarted on zyprexa and remeron. He will also be continued on gabapentin which has previously be restarted. Pt is interested in referral to substance use treatment and he will discuss options with SW team.   PLAN OF CARE:   -Admit to inpatient level of care  -Bipolar II, current episode depressed,  without psychotic features             -Start zyprexa 28m po qhs             -Start remeron 163mpo qhs             -DC wellbutrin  -Anxiety                        -  Continue gabapentin 638m po TID             -Continue atarax 253mpo q6h prn anxiety  -Insomnia             -Continue trazodone 5011mo qhs prn insomnia   -Encourage participation in groups and therapeutic milieu  -Disposition planning will be ongoing     ChrMaris BergerD

## 2018-03-27 DIAGNOSIS — F329 Major depressive disorder, single episode, unspecified: Secondary | ICD-10-CM

## 2018-03-27 DIAGNOSIS — F419 Anxiety disorder, unspecified: Secondary | ICD-10-CM

## 2018-03-27 MED ORDER — HYDROXYZINE HCL 25 MG PO TABS
25.0000 mg | ORAL_TABLET | Freq: Four times a day (QID) | ORAL | Status: DC | PRN
  Administered 2018-03-28: 25 mg via ORAL
  Filled 2018-03-27: qty 10
  Filled 2018-03-27 (×2): qty 1

## 2018-03-27 NOTE — Progress Notes (Signed)
Patient ID: Brian [name], male   DOB: 1986/02/20, 32 y.o.   MRN: [id number]   D: Patient pleasant on approach tonight. Interacted after group with AA members that came for group. Patient talked about his struggle of being clean  For a while then relapsing due to his depression. Patient feels that if he did not have depression he would be better able to stay away from drugs. Still reports mood unchanged at present. Passive SI on and off today. Contracts on the unit. A: Staff will monitor on q 15 minute checks, follow treatment plan, and give medications as ordered. R: Cooperative on the unit.

## 2018-03-27 NOTE — Progress Notes (Signed)
Patient did not attend the evening speaker AA meeting. Pt was notified that group was beginning but remained in bed.   

## 2018-03-27 NOTE — BHH Group Notes (Signed)
Recreation Activity  Date:  03/27/2018  Time:  5:51 PM  Type of Therapy:  BINGO: The purpose of the group is to create a forum where patients can experience laughter in a [employer].  Participation Level:  Did Not Attend  Participation Quality:    Affect:    Cognitive:    Insight:    Engagement in Group:    Modes of Intervention:    Summary of Progress/Problems:  Lauralyn Primes 03/27/2018, 5:51 PM

## 2018-03-27 NOTE — Progress Notes (Signed)
D.  Pt pleasant on approach, denies complaints at this time.  Pt did not attend evening AA group.  Pt observed engaged in minimal but appropriate interaction with peers on unit.  Pt denies HI/AVH at this time, does report passive SI but verbally agrees to be safe on unit.  A.  Support and encouragement offered, medication given as ordered  R.  Pt remains safe on the unit, will continue to monitor.

## 2018-03-27 NOTE — Plan of Care (Signed)
Patient demonstrating participation in group therapy, but setting nonspecific goals. Hopes to get discharged to [facility], and verbalizes need to stay "clean" in order to do so.

## 2018-03-27 NOTE — Progress Notes (Signed)
D: Patient requested to come out of room and take medications as prescribed, patient retorted "get what over with?" Patient presenting irritable and depressed. Endorsing SI without plan or intent, stating "not in here" when asked if he had a plan. He stated he was "hanging in there," but that he was feeling depressed. Creatinine elevated at 1.31. Patient hypotensive with BP 86/51. He denies any symptoms of dizziness or lightheadedness. Patient agreed that his depression contributes to his drug abuse.  A: Offered and gave gatorade and will recheck BP. Patient encouraged and support provided. Fluids encouraged. Reviewed medications and monitored for side effects. Encouraged participation in group therapy.  R: Patient attended group this morning. Goal "to not be so depressed and to get better." Contracts for safety. Medication compliant.

## 2018-03-27 NOTE — BHH Group Notes (Signed)
[facility] Group Notes: (Clinical Social Work)   03/27/2018      Type of Therapy:  Group Therapy   Participation Level:  Did Not Attend despite MHT prompting   Selmer Dominion, LCSW 03/27/2018, 11:13 AM

## 2018-03-27 NOTE — BHH Group Notes (Signed)
Identifying Needs   Date:  03/27/2018  Time:  3:10 PM  Type of Therapy:  Nurse Education  /  The group focuses on teaching patients how to identify their needs and then how to develop healthier coping skills needed to get some of those needs met.  Participation Level:  Did Not Attend  Participation Quality:    Affect:    Cognitive:    Insight:    Engagement in Group:    Modes of Intervention:    Summary of Progress/Problems:  Lauralyn Primes 03/27/2018, 3:10 PM

## 2018-03-27 NOTE — Progress Notes (Signed)
Pt attended goals/ orientation group this morning. Pt goal for today is to not be so depressed and to get better.

## 2018-03-27 NOTE — Progress Notes (Signed)
Pt attend wrap up group. 

## 2018-03-27 NOTE — Progress Notes (Signed)
[facility] MD Progress Note  03/27/2018 10:53 AM Brian [name]  MRN:  [id number] Subjective:  " I am still feeling depressed". Denies medication side effects. Denies suicidal plan or intention. Objective : I have reviewed chart notes and have met with patient. He is a 32 year old male,was living in [facility]. Has history of polysubstance use disorder ( alcohol, which he stopped in 2016, opiates, methamphetamines). Presented to hospital due to depression, suicidal ideations, and substance abuse . States he had been sober x more than a month but " still felt depressed ". He relapsed on heroin and methamphetamine x 1 time prior to admission.  He has been diagnosed with Bipolar Disorder in the past , but does not currently endorse any clear history of mania.  Currently presents vaguely depressed, dysphoric. Endorses some passive thoughts of death, but denies any plan or intention of suicide and contracts for safety on unit. No disruptive or agitated behaviors on unit. Limited milieu/group participation. Denies medication side effects   Principal Problem: Substance induced mood disorder ([place]) Diagnosis:   Patient Active Problem List   Diagnosis Date Noted  . 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 Substance induced mood disorder ([place]) [F19.94] 03/26/2018  . Bipolar II disorder, severe, depressed, with anxious distress ([place]) [F31.81] 03/25/2018  . Cellulitis [L03.90] 05/20/2015  . Dysuria [R30.0] 05/20/2015  . Smoker [F17.200] 05/20/2015  . Homeless single person [Z59.0] 05/20/2015  . Drug abuse ([place]) [F19.10]   . Opioid use disorder, severe, dependence ([place]) [F11.20] 03/08/2015  . Ileus of unspecified type ([place]) [K56.7] 05/28/2014  . Abdominal pain [R10.9] 05/28/2014  . AKI (acute kidney injury) ([place]) [N17.9] 05/28/2014  . Esophagitis, acute [K20.9] 05/28/2014  . Polysubstance abuse ([place]) [F19.10] 05/28/2014  . Hematemesis [K92.0] 05/28/2014   Total Time spent with  patient: 20 minutes  Past Psychiatric History:   Past Medical History:  Past Medical History:  Diagnosis Date  . Alcohol abuse   . Anxiety   . Cocaine abuse ([place])   . Depression   . Kidney stones   . Narcotic abuse ([place])   . Peptic ulcer   . Polysubstance abuse ([place])   . Renal disorder    kidney stones    Past Surgical History:  Procedure Laterality Date  . ESOPHAGOGASTRODUODENOSCOPY N/A 05/29/2014   Procedure: ESOPHAGOGASTRODUODENOSCOPY (EGD) ;  Surgeon: Beryle Beams, MD;  Location: [facility] ENDOSCOPY;  Service: Endoscopy;  Laterality: N/A;  check with Dr. Collene Mares about sedation type/timinng - I recommend MAC  . HAND SURGERY  2006   Family History: History reviewed. No pertinent family history. Family Psychiatric  History:  Social History:  Social History   Substance and Sexual Activity  Alcohol Use Not Currently     Social History   Substance and Sexual Activity  Drug Use Yes  . Types: Marijuana, Cocaine, IV   Comment: OPIATES    Social History   Socioeconomic History  . Marital status: Single    Spouse name: Not on file  . Number of children: Not on file  . Years of education: Not on file  . Highest education level: Not on file  Occupational History  . Not on file  Social Needs  . Financial resource strain: Not on file  . Food insecurity:    Worry: Not on file    Inability: Not on file  . Transportation needs:    Medical: Not on file    Non-medical: Not on file  Tobacco Use  . Smoking  status: Current Every Day Smoker    Packs/day: 0.50    Types: Cigarettes  . Smokeless tobacco: Never Used  Substance and Sexual Activity  . Alcohol use: Not Currently  . Drug use: Yes    Types: Marijuana, Cocaine, IV    Comment: OPIATES  . Sexual activity: Not Currently    Birth control/protection: Condom  Lifestyle  . Physical activity:    Days per week: Not on file    Minutes per session: Not on file  . Stress: Not on file  Relationships  . Social connections:     Talks on phone: Not on file    Gets together: Not on file    Attends religious service: Not on file    Active member of club or organization: Not on file    Attends meetings of clubs or organizations: Not on file    Relationship status: Not on file  Other Topics Concern  . 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 famotidine (PEPCID) tablet 20 mg  20 mg Oral BID Rankin, Shuvon B, NP   20 mg at 03/27/18 0843  . gabapentin (NEURONTIN) capsule 600 mg  600 mg Oral TID Rankin, Shuvon B, NP   600 mg at 03/27/18 0843  . hydrOXYzine (ATARAX/VISTARIL) tablet 25 mg  25 mg Oral Q6H Rankin, Shuvon B, NP   25 mg at 03/27/18 0843  . mirtazapine (REMERON) tablet 15 mg  15 mg Oral QHS Nwoko, Agnes I, NP   15 mg at 03/26/18 2112  . nicotine (NICODERM CQ - dosed in mg/24 hours) patch 21 mg  21 mg Transdermal Daily Rankin, Shuvon B, NP   21 mg at 03/26/18 0752  . OLANZapine (ZYPREXA) tablet 10 mg  10 mg Oral QHS Lindell Spar I, NP   10 mg at 03/26/18 2113  . traZODone (DESYREL) tablet 50 mg  50 mg Oral QHS PRN Lindell Spar I, NP        Lab Results: No results found for this or any previous visit (from the past 48 hour(s)).  Blood Alcohol level:  Lab Results  Component Value Date   ETH <10 03/25/2018   ETH <5 86/76/1952    Metabolic Disorder Labs: No results found for: HGBA1C, MPG No results found for: PROLACTIN No results found for: CHOL, TRIG, HDL, CHOLHDL, VLDL, LDLCALC  Physical Findings: AIMS: Facial and Oral Movements Muscles of Facial Expression: None, normal Lips and Perioral Area: None, normal Jaw: None, normal Tongue: None, normal,Extremity Movements Upper (arms, wrists, hands, fingers):  None, normal Lower (legs, knees, ankles, toes): None, normal, Trunk Movements Neck, shoulders, hips: None, normal, Overall Severity Severity of abnormal movements (highest score from questions above): None, normal Incapacitation due to abnormal movements: None, normal Patient's awareness of abnormal movements (rate only patient's report): No Awareness, Dental Status Current problems with teeth and/or dentures?: No Does patient usually wear dentures?: No  CIWA:  CIWA-Ar Total: 0 COWS:  COWS Total Score: 0  Musculoskeletal: Strength & Muscle Tone: within normal limits Gait & Station: normal Patient leans: N/A  Psychiatric Specialty Exam: Physical Exam  ROS denies headache, no chest pain, no shortness of breath, no vomiting   Blood pressure Marland Kitchen)  86/51, pulse 74, temperature 97.9 F (36.6 C), temperature source Oral, resp. rate 18, height _0  (1.727 m), weight 74.8 kg (165 lb), SpO2 98 %.Body mass index is 25.09 kg/m.  General Appearance: Fairly Groomed  Eye Contact:  Fair  Speech:  Normal Rate  Volume:  Normal  Mood:  depressed, vaguely dysphoric   Affect:  constricted, but does smile at times appropriately  Thought Process:  Linear and Descriptions of Associations: Intact  Orientation:  Other:  fully alert and attentive   Thought Content:  denies hallucinations, no delusions, not internally preoccupied   Suicidal Thoughts:  Yes.  without intent/plan denies suicidal plan or intention, contracts for safety on unit, presents future oriented .  Homicidal Thoughts:  No  Memory:  recent and remote grossly intact   Judgement:  Fair  Insight:  Fair  Psychomotor Activity:  Decreased  Concentration:  Concentration: Good and Attention Span: Good  Recall:  Good  Fund of Knowledge:  Good  Language:  Good  Akathisia:  No  Handed:  Right  AIMS (if indicated):     Assets:  Desire for Improvement Resilience  ADL's:  Intact  Cognition:  WNL  Sleep:  Number of Hours: 6.75   Assessment -  patient reports he is feeling partially better, but continues to feel depressed, and ruminative/guilty about recent relapse. Endorses some passive thoughts of death, but denies plan or intention of hurting self or of suicide.   Treatment Plan Summary: Daily contact with patient to assess and evaluate symptoms and progress in treatment, Medication management, Plan inpatient admission and medications as below Encourage group and milieu participation to work on coping skills and symptom reduction Encourage efforts to work on sobriety and relapse prevention Treatment Team working on disposition planning  Continue Zyprexa 10 mgrs QHS for mood disorder  Continue Remeron 15 mgrs QHS for depression, insomnia Continue Neurontin 600 mgrsd TID for anxiety, pain Continue Vistaril 25 mgrs Q 6 hours PRN for anxiety as needed  Continue Trazodone 50 mgrs QHS PRN for insomnia as needed  Check HgbA1C, Lipid Panel as on [place], MD 03/27/2018, 10:53 AM

## 2018-03-28 DIAGNOSIS — Z79899 Other long term (current) drug therapy: Secondary | ICD-10-CM

## 2018-03-28 DIAGNOSIS — F1110 Opioid abuse, uncomplicated: Secondary | ICD-10-CM

## 2018-03-28 DIAGNOSIS — F1510 Other stimulant abuse, uncomplicated: Secondary | ICD-10-CM

## 2018-03-28 DIAGNOSIS — R52 Pain, unspecified: Secondary | ICD-10-CM

## 2018-03-28 LAB — LIPID PANEL
Cholesterol: 152 mg/dL (ref 0–200)
HDL: 44 mg/dL (ref >40)
LDL CALC: 88 mg/dL (ref 0–99)
TRIGLYCERIDES: 99 mg/dL (ref <150)
Total CHOL/HDL Ratio: 3.5 RATIO
VLDL: 20 mg/dL (ref 0–40)

## 2018-03-28 LAB — HEMOGLOBIN A1C
Hgb A1c MFr Bld: 5.4 % (ref 4.8–5.6)
Mean Plasma Glucose: 108.28 mg/dL

## 2018-03-28 LAB — TSH: TSH: 1.644 u[IU]/mL (ref 0.350–4.500)

## 2018-03-28 NOTE — Progress Notes (Signed)
[facility] MD Progress Note  03/28/2018 3:32 PM GREER KOEPPEN  MRN:  [id number] Subjective: Patient reports he is "about the same".  Continues to feel depressed but denies any suicidal ideations and present future oriented.  Denies medication side effects. Describes some mild symptoms of withdrawal such as cramps and some myalgias, no vomiting, no diarrhea.  States he had only used x1 recently.   Objective : I have reviewed chart notes and have met with patient. He is a 32 year old male,was living in [facility]. Has history of polysubstance use disorder ( alcohol, which he stopped in 2016, opiates, methamphetamines). Presented to hospital due to depression, suicidal ideations, and substance abuse . Reports he relapsed on heroin and methamphetamine x 1 time prior to admission.  Today presents with a slightly improved mood.  Affect presents less dysphoric/less constricted.  Denies suicidal ideations. He has tolerated medications well without side effects ( is on Remeron and Zyprexa, but this morning refused to medications except for Neurontin).  Visible in dayroom, behavior on unit in good control, no agitated behavior. Labs reviewed- TSH 1.644, HgbA1C 5.4 , lipid panel WNL.   Principal Problem: Substance induced mood disorder ([place]) Diagnosis:   Patient Active Problem List   Diagnosis Date Noted  . Amphetamine use disorder, severe, dependence ([place]) [F15.20] 03/26/2018  . Substance induced mood disorder ([place]) [F19.94] 03/26/2018  . Bipolar II disorder, severe, depressed, with anxious distress ([place]) [F31.81] 03/25/2018  . Cellulitis [L03.90] 05/20/2015  . Dysuria [R30.0] 05/20/2015  . Smoker [F17.200] 05/20/2015  . Homeless single person [Z59.0] 05/20/2015  . Drug abuse ([place]) [F19.10]   . Opioid use disorder, severe, dependence ([place]) [F11.20] 03/08/2015  . Ileus of unspecified type ([place]) [K56.7] 05/28/2014  . Abdominal pain [R10.9] 05/28/2014  . AKI (acute kidney injury) ([place]) [N17.9] 05/28/2014  .  Esophagitis, acute [K20.9] 05/28/2014  . Polysubstance abuse ([place]) [F19.10] 05/28/2014  . Hematemesis [K92.0] 05/28/2014   Total Time spent with patient: 15 minutes  Past Psychiatric History:   Past Medical History:  Past Medical History:  Diagnosis Date  . Alcohol abuse   . Anxiety   . Cocaine abuse ([place])   . Depression   . Kidney stones   . Narcotic abuse ([place])   . Peptic ulcer   . Polysubstance abuse ([place])   . Renal disorder    kidney stones    Past Surgical History:  Procedure Laterality Date  . ESOPHAGOGASTRODUODENOSCOPY N/A 05/29/2014   Procedure: ESOPHAGOGASTRODUODENOSCOPY (EGD) ;  Surgeon: Beryle Beams, MD;  Location: [facility] ENDOSCOPY;  Service: Endoscopy;  Laterality: N/A;  check with Dr. Collene Mares about sedation type/timinng - I recommend MAC  . HAND SURGERY  2006   Family History: History reviewed. No pertinent family history. Family Psychiatric  History:  Social History:  Social History   Substance and Sexual Activity  Alcohol Use Not Currently     Social History   Substance and Sexual Activity  Drug Use Yes  . Types: Marijuana, Cocaine, IV   Comment: OPIATES    Social History   Socioeconomic History  . Marital status: Single    Spouse name: Not on file  . Number of children: Not on file  . Years of education: Not on file  . Highest education level: Not on file  Occupational History  . Not on file  Social Needs  . Financial resource strain: Not on file  . Food insecurity:    Worry: Not on file    Inability: Not on file  .  Transportation needs:    Medical: Not on file    Non-medical: Not on file  Tobacco Use  . Smoking status: Current Every Day Smoker    Packs/day: 0.50    Types: Cigarettes  . Smokeless tobacco: Never Used  Substance and Sexual Activity  . Alcohol use: Not Currently  . Drug use: Yes    Types: Marijuana, Cocaine, IV    Comment: OPIATES  . Sexual activity: Not Currently    Birth control/protection: Condom  Lifestyle  .  Physical activity:    Days per week: Not on file    Minutes per session: Not on file  . Stress: Not on file  Relationships  . Social connections:    Talks on phone: Not on file    Gets together: Not on file    Attends religious service: Not on file    Active member of club or organization: Not on file    Attends meetings of clubs or organizations: Not on file    Relationship status: Not on file  Other Topics Concern  . 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 famotidine (PEPCID) tablet 20 mg  20 mg Oral BID Rankin, Shuvon B, NP   20 mg at 03/27/18 0843  . gabapentin (NEURONTIN) capsule 600 mg  600 mg Oral TID Rankin, Shuvon B, NP   600 mg at 03/28/18 1159  . hydrOXYzine (ATARAX/VISTARIL) tablet 25 mg  25 mg Oral Q6H PRN Patriciaann Clan E, PA-C      . mirtazapine (REMERON) tablet 15 mg  15 mg Oral QHS Nwoko, Agnes I, NP   15 mg at 03/27/18 2057  . nicotine (NICODERM CQ - dosed in mg/24 hours) patch 21 mg  21 mg Transdermal Daily Rankin, Shuvon B, NP   21 mg at 03/26/18 0752  . OLANZapine (ZYPREXA) tablet 10 mg  10 mg Oral QHS Lindell Spar I, NP   10 mg at 03/27/18 2057  . traZODone (DESYREL) tablet 50 mg  50 mg Oral QHS PRN Lindell Spar I, NP        Lab Results:  Results for orders placed or performed during the hospital encounter of 03/25/18 (from the past 48 hour(s))  Lipid panel     Status: None   Collection Time: 03/28/18  6:04 AM  Result Value Ref Range   Cholesterol 152 0 - 200 mg/dL   Triglycerides 99 <150 mg/dL   HDL 44 >40 mg/dL   Total CHOL/HDL Ratio 3.5 RATIO   VLDL 20 0 - 40 mg/dL   LDL Cholesterol 88 0 - 99 mg/dL    Comment:        Total Cholesterol/HDL:CHD  Risk Coronary Heart Disease Risk Table                     Men   Women  1/2 Average Risk   3.4   3.3  Average Risk       5.0   4.4  2 X Average Risk   9.6   7.1  3 X Average Risk  23.4   11.0        Use the calculated Patient Ratio above and the CHD Risk  Table to determine the patient's CHD Risk.        ATP III CLASSIFICATION (LDL):  <100     mg/dL   Optimal  100-129  mg/dL   Near or Above                    Optimal  130-159  mg/dL   Borderline  160-189  mg/dL   High  >190     mg/dL   Very High Performed at [facility] [address], [place], [place] [address]   Hemoglobin A1c     Status: None   Collection Time: 03/28/18  6:04 AM  Result Value Ref Range   Hgb A1c MFr Bld 5.4 4.8 - 5.6 %    Comment: (NOTE) Pre diabetes:          5.7%-6.4% Diabetes:              >6.4% Glycemic control for   <7.0% adults with diabetes    Mean Plasma Glucose 108.28 mg/dL    Comment: Performed at [facility] [address] [place], [place] [address]  TSH     Status: None   Collection Time: 03/28/18  6:04 AM  Result Value Ref Range   TSH 1.644 0.350 - 4.500 uIU/mL    Comment: Performed by a 3rd Generation assay with a functional sensitivity of <=0.01 uIU/mL. Performed at [facility], [place] [address], [place], [place] [address]     Blood Alcohol level:  Lab Results  Component Value Date   ETH <10 03/25/2018   ETH <5 47/42/5956    Metabolic Disorder Labs: Lab Results  Component Value Date   HGBA1C 5.4 03/28/2018   MPG 108.28 03/28/2018   No results found for: PROLACTIN Lab Results  Component Value Date   CHOL 152 03/28/2018   TRIG 99 03/28/2018   HDL 44 03/28/2018   CHOLHDL 3.5 03/28/2018   VLDL 20 03/28/2018   LDLCALC 88 03/28/2018    Physical Findings: AIMS: Facial and Oral Movements Muscles of Facial Expression: None, normal Lips and Perioral Area: None, normal Jaw: None, normal Tongue: None, normal,Extremity  Movements Upper (arms, wrists, hands, fingers): None, normal Lower (legs, knees, ankles, toes): None, normal, Trunk Movements Neck, shoulders, hips: None, normal, Overall Severity Severity of abnormal movements (highest score from questions above): None, normal Incapacitation due to abnormal movements: None, normal Patient's awareness of abnormal movements (rate only patient's report): No Awareness, Dental Status Current problems with teeth and/or dentures?: No Does patient usually wear dentures?: No  CIWA:  CIWA-Ar Total: 1 COWS:  COWS Total Score: 0  Musculoskeletal: Strength & Muscle Tone: within normal limits Gait & Station: normal Patient leans: N/A  Psychiatric Specialty Exam: Physical Exam  ROS denies headache, no chest pain, no shortness of breath, no vomiting   Blood pressure 117/73, pulse (!) 58, temperature 97.7 F (36.5 C), temperature source Oral, resp. rate 18, height _0  (1.727 m), weight 74.8 kg (165 lb), SpO2 98 %.Body mass index is 25.09 kg/m.  General Appearance: Improving grooming  Eye Contact:  Good  Speech:  Normal Rate  Volume:  Normal  Mood:  Reports mood is "same"  Affect:  More reactive, less irritable, vaguely anxious  Thought Process:  Linear and Descriptions of Associations: Intact  Orientation:  Other:  fully alert and attentive   Thought Content:  denies hallucinations, no delusions, not internally preoccupied   Suicidal Thoughts:  No denies suicidal plan or  intention, contracts for safety on unit, presents future oriented .  Homicidal Thoughts:  No  Memory:  recent and remote grossly intact   Judgement:  Fair  Insight:  Fair  Psychomotor Activity:  Decreased  Concentration:  Concentration: Good and Attention Span: Good  Recall:  Good  Fund of Knowledge:  Good  Language:  Good  Akathisia:  No  Handed:  Right  AIMS (if indicated):     Assets:  Desire for Improvement Resilience  ADL's:  Intact  Cognition:  WNL  Sleep:  Number of Hours:  5.75   Assessment -patient reports ongoing depression, but today presents with improved eye contact better overall relatedness and a partially improved range of affect.  Today denies suicidal ideations.  He is tolerating medications well thus far.   Treatment Plan Summary: Daily contact with patient to assess and evaluate symptoms and progress in treatment, Medication management, Plan inpatient admission and medications as below  Treatment plan reviewed as below today June 2nd Encourage group and milieu participation to work on Radiographer, therapeutic and symptom reduction Encourage efforts to work on sobriety and relapse prevention Treatment Team working on disposition planning  Continue Zyprexa 10 mgrs QHS for mood disorder  Continue Remeron 15 mgrs QHS for depression, insomnia Continue Neurontin 600 mgrsd TID for anxiety, pain Continue Vistaril 25 mgrs Q 6 hours PRN for anxiety as needed  Continue Trazodone 50 mgrs QHS PRN for insomnia as needed   Jenne Campus, MD 03/28/2018, 3:32 PM   Patient ID: [name], male   DOB: 06-23-1986, 32 y.o.   MRN: [id number]

## 2018-03-28 NOTE — Progress Notes (Signed)
Patient did not attend the evening speaker AA meeting. Pt was notified that group was beginning but remained in bed.   

## 2018-03-28 NOTE — Progress Notes (Signed)
Pt did not attend goals or orientation group this morning. pt has been sleep most of the morning

## 2018-03-28 NOTE — Progress Notes (Signed)
Patient ID: Brian [name], male   DOB: 09/15/1986, 32 y.o.   MRN: [id number]   Pt currently presents with a flat affect and irritable behavior. Reports ongoing anxiety and withdrawal symptom of malaise. Pt forwards little to writer, states goal is to "I guess not be so depressed." Refuses all medications accept Neurontin this morning.   Pt provided with medications per providers orders. Pt's labs and vitals were monitored throughout the night. Pt given a 1:1 about emotional and mental status. Pt supported and encouraged to express concerns and questions. Pt educated on medications.  Pt's safety ensured with 15 minute and environmental checks. Pt currently denies SI/HI and A/V hallucinations. Pt verbally agrees to seek staff if SI/HI or A/VH occurs and to consult with staff before acting on any harmful thoughts. Will continue POC.

## 2018-03-28 NOTE — [facility] Group Notes (Addendum)
[facility] Group Notes:  (Nursing/MHT/Case Management/Adjunct)  Date:  03/28/2018  Time:  2:04 PM  Type of Therapy:  Psychoeducational Skills  Participation Level:  Active  Participation Quality:  Appropriate and Attentive  Affect:  Appropriate  Cognitive:  Alert and Oriented  Insight:  Good  Engagement in Group:  Engaged  Modes of Intervention:  Activity, Discussion and Education  Summary of Progress/Problems: Pt actively participated in group and was able to identify one personal skill for healthy communication.   Elenore Rota 03/28/2018, 2:04 PM

## 2018-03-28 NOTE — Progress Notes (Signed)
D.  Pt pleasant on approach, in dayroom watching TV with peers.  Pt requested Trazodone be given with other sleep medications tonight as he reported difficultly falling asleep last night.  Pt was positive for evening AA group tonight.  Pt reports passive SI at times with no plan and does verbally agree to safety on the unit.  A.  Support and encouragement offered, medication given as ordered  R.  Pt remains safe on the unit, will continue to monitor.

## 2018-03-28 NOTE — Progress Notes (Signed)
Patient ID: Brian [name], male   DOB: 05-Nov-1985, 32 y.o.   MRN: [id number]  Pt denied SI to writer this morning. On patients assessment form, pt endorsed ongoing SI, depression 7/10 and hopelessness 7/10. Pt verbally agrees to contact staff before acting on any harmful thoughts. Will continue to monitor.

## 2018-03-28 NOTE — B[facility] Group Notes (Signed)
[facility] Group Notes: (Clinical Social Work)   03/28/2018      Type of Therapy:  Group Therapy   Participation Level:  Did Not Attend despite MHT prompting   Selmer Dominion, LCSW 03/28/2018, 12:31 PM

## 2018-03-29 MED ORDER — FLUOXETINE HCL 20 MG PO CAPS
20.0000 mg | ORAL_CAPSULE | Freq: Every day | ORAL | Status: DC
  Administered 2018-03-29 – 2018-04-01 (×4): 20 mg via ORAL
  Filled 2018-03-29 (×2): qty 1
  Filled 2018-03-29 (×2): qty 7
  Filled 2018-03-29 (×4): qty 1

## 2018-03-29 MED ORDER — MIRTAZAPINE 7.5 MG PO TABS
7.5000 mg | ORAL_TABLET | Freq: Every day | ORAL | Status: DC
  Administered 2018-03-29 – 2018-03-31 (×3): 7.5 mg via ORAL
  Filled 2018-03-29: qty 7
  Filled 2018-03-29 (×2): qty 1
  Filled 2018-03-29: qty 7
  Filled 2018-03-29 (×2): qty 1

## 2018-03-29 NOTE — Progress Notes (Signed)
Patient ID: Brian [name], male   DOB: [birth year], 32 y.o.   MRN: [id number]  Pt currently presents with a flat, depressed affect and guarded behavior. Reports ongoing negative thoughts, ongoing pain and anxiety. Pt forwards little but opens up during interaction. Pt states goal is to "stop being depressed."  Pt provided with medications per providers orders. Pt's labs and vitals were monitored throughout the night. Pt given a 1:1 about emotional and mental status. Pt supported and encouraged to express concerns and questions. Pt educated on medications.  Pt's safety ensured with 15 minute and environmental checks. Pt currently denies SI to writer but endorses SI on his self inventory sheet. Currently denies A/V hallucinations. Pt verbally agrees to seek staff if SI worsens, HI or A/VH occurs and to consult with staff before acting on any harmful thoughts. Will continue POC.

## 2018-03-29 NOTE — Progress Notes (Signed)
Psychoeducational Group Note  Date:  03/29/2018 Time:  2158  Group Topic/Focus:  Wrap-Up Group:   The focus of this group is to help patients review their daily goal of treatment and discuss progress on daily workbooks.  Participation Level: Did Not Attend  Participation Quality:  Not Applicable  Affect:  Not Applicable  Cognitive:  Not Applicable  Insight:  Not Applicable  Engagement in Group: Not Applicable  Additional Comments:  The patient did not attend the evening group since he was asleep in his bedroom.   Archie Balboa S 03/29/2018, 9:58 PM

## 2018-03-29 NOTE — Progress Notes (Signed)
Recreation Therapy Notes  Date: 6.3.19 Time: 0930 Location: 300 Hall Dayroom  Group Topic: Stress Management  Goal Area(s) Addresses:  Patient will verbalize importance of using healthy stress management.  Patient will identify positive emotions associated with healthy stress management.   Intervention: Stress Management  Activity :  Body Scan Meditation.  LRT introduced the stress management technique of meditation.  LRT played a meditation that focused on scanning the body for any feelings or sensations they may be feeling.  Patients were to follow along as meditation played.  Education:  Stress Management, Discharge Planning.   Education Outcome: Acknowledges edcuation/In group clarification offered/Needs additional education  Clinical Observations/Feedback: Pt did not attend group.    Victorino Sparrow, LRT/CTRS         Victorino Sparrow A 03/29/2018 11:31 AM

## 2018-03-29 NOTE — Progress Notes (Signed)
[facility] MD Progress Note  03/29/2018 1:51 PM SABASTIAN RAIMONDI  MRN:  [id number]  Subjective: Brian [name] reports, "I'm not in a good place today. My stomach is cramping, may be caused by stress & a lot of worrying. It started yesterday. I feel very depressed, not knowing what will happen from here".  Brian [name] is a 32 y/o M with history of Bipolar II and polysubstance abuse who was admitted voluntarily from [facility] with worsening depression, SI with plan to overdose on heroin and recent attempt, and recent relapse on multiple substances including amphetamines, opiates, and cocaine. Pt was medically cleared and then transferred to [facility] for additional treatment. Upon initial interview, pt shares, "A long while ago I got hooked on pain pills, and in 2015 when my wife left, I started heroin. I got clear for a while when I moved out to [place] and got on methadone, but in February I started using meth and I had gotten clean about 1 month ago and got to an [facility], but I thought the depression would go away and it didn't and I relapsed again.  Today, Brian [name] is seen, chart reviewed. The chart findings discussed with the treatment team. He is alert, oriented & aware of situation. He is lying down in his bed. He is complaining of stomach cramps. Thought it was caused by him worrying a lot. He says he is not sure of what will happen after discharge. He says he has a pending court date for probation violation & will not be able to get into any substance abuse treatment program as a result. He says he has not felt any better being in this hospital. Patient is in agreement to adjust his antidepressant. Today, the Remeron was decreased from 15 mg to 7.5 mg po Q hs & Prozac 20 was initiated. He is encouraged to come out of his room & participate in the group sessions. He continues to endorse passive SI, denies any plans or intent to hurt himself. He denies any SIHI, AVH, delusional thoughts or paranoia. He does not  appear to be responding to any internal stimuli.  Principal Problem: Substance induced mood disorder ([place])  Diagnosis:   Patient Active Problem List   Diagnosis Date Noted  . Substance induced mood disorder ([place]) [F19.94] 03/26/2018    Priority: High  . Opioid use disorder, severe, dependence ([place]) [F11.20] 03/08/2015    Priority: High  . 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 Bipolar II disorder, severe, depressed, with anxious distress ([place]) [F31.81] 03/25/2018  . Cellulitis [L03.90] 05/20/2015  . Dysuria [R30.0] 05/20/2015  . Smoker [F17.200] 05/20/2015  . Homeless single person [Z59.0] 05/20/2015  . Drug abuse ([place]) [F19.10]   . Ileus of unspecified type ([place]) [K56.7] 05/28/2014  . Abdominal pain [R10.9] 05/28/2014  . AKI (acute kidney injury) ([place]) [N17.9] 05/28/2014  . Esophagitis, acute [K20.9] 05/28/2014  . Polysubstance abuse ([place]) [F19.10] 05/28/2014  . Hematemesis [K92.0] 05/28/2014   Total Time spent with patient: 15 minutes  Past Psychiatric History: See H&P  Past Medical History:  Past Medical History:  Diagnosis Date  . Alcohol abuse   . Anxiety   . Cocaine abuse ([place])   . Depression   . Kidney stones   . Narcotic abuse ([place])   . Peptic ulcer   . Polysubstance abuse ([place])   . Renal disorder    kidney stones    Past Surgical History:  Procedure Laterality Date  .  ESOPHAGOGASTRODUODENOSCOPY N/A 05/29/2014   Procedure: ESOPHAGOGASTRODUODENOSCOPY (EGD) ;  Surgeon: Beryle Beams, MD;  Location: [facility] ENDOSCOPY;  Service: Endoscopy;  Laterality: N/A;  check with Dr. Collene Mares about sedation type/timinng - I recommend MAC  . HAND SURGERY  2006   Family History: History reviewed. No pertinent family history.  Family Psychiatric  History: See H&P  Social History:  Social History   Substance and Sexual Activity  Alcohol Use Not Currently     Social History   Substance and Sexual Activity  Drug Use Yes   . Types: Marijuana, Cocaine, IV   Comment: OPIATES    Social History   Socioeconomic History  . Marital status: Single    Spouse name: Not on file  . Number of children: Not on file  . Years of education: Not on file  . Highest education level: Not on file  Occupational History  . Not on file  Social Needs  . Financial resource strain: Not on file  . Food insecurity:    Worry: Not on file    Inability: Not on file  . Transportation needs:    Medical: Not on file    Non-medical: Not on file  Tobacco Use  . Smoking status: Current Every Day Smoker    Packs/day: 0.50    Types: Cigarettes  . Smokeless tobacco: Never Used  Substance and Sexual Activity  . Alcohol use: Not Currently  . Drug use: Yes    Types: Marijuana, Cocaine, IV    Comment: OPIATES  . Sexual activity: Not Currently    Birth control/protection: Condom  Lifestyle  . Physical activity:    Days per week: Not on file    Minutes per session: Not on file  . Stress: Not on file  Relationships  . Social connections:    Talks on phone: Not on file    Gets together: Not on file    Attends religious service: Not on file    Active member of club or organization: Not on file    Attends meetings of clubs or organizations: Not on file    Relationship status: Not on file  Other Topics Concern  . 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 famotidine (PEPCID) tablet 20 mg  20 mg Oral BID Rankin, Shuvon B, NP   20 mg at 03/27/18 0843  . gabapentin (NEURONTIN) capsule 600 mg  600 mg Oral TID Rankin, Shuvon B, NP   600 mg at 03/29/18 1208  . hydrOXYzine (ATARAX/VISTARIL) tablet 25 mg  25 mg Oral Q6H PRN Laverle Hobby,  PA-C   25 mg at 03/28/18 1813  . mirtazapine (REMERON) tablet 15 mg  15 mg Oral QHS Lindell Spar I, NP   15 mg at 03/28/18 2118  . nicotine (NICODERM CQ - dosed in mg/24 hours) patch 21 mg  21 mg Transdermal Daily Rankin, Shuvon B, NP   21 mg at 03/26/18 0752  . OLANZapine (ZYPREXA) tablet 10 mg  10 mg Oral QHS Lindell Spar I, NP   10 mg at 03/28/18 2118  . traZODone (DESYREL) tablet 50 mg  50 mg Oral QHS PRN Lindell Spar I, NP   50 mg at 03/28/18  2118   Lab Results:  Results for orders placed or performed during the hospital encounter of 03/25/18 (from the past 48 hour(s))  Lipid panel     Status: None   Collection Time: 03/28/18  6:04 AM  Result Value Ref Range   Cholesterol 152 0 - 200 mg/dL   Triglycerides 99 <150 mg/dL   HDL 44 >40 mg/dL   Total CHOL/HDL Ratio 3.5 RATIO   VLDL 20 0 - 40 mg/dL   LDL Cholesterol 88 0 - 99 mg/dL    Comment:        Total Cholesterol/HDL:CHD Risk Coronary Heart Disease Risk Table                     Men   Women  1/2 Average Risk   3.4   3.3  Average Risk       5.0   4.4  2 X Average Risk   9.6   7.1  3 X Average Risk  23.4   11.0        Use the calculated Patient Ratio above and the CHD Risk Table to determine the patient's CHD Risk.        ATP III CLASSIFICATION (LDL):  <100     mg/dL   Optimal  100-129  mg/dL   Near or Above                    Optimal  130-159  mg/dL   Borderline  160-189  mg/dL   High  >190     mg/dL   Very High Performed at [facility] [address], [place], [place] [address]   Hemoglobin A1c     Status: None   Collection Time: 03/28/18  6:04 AM  Result Value Ref Range   Hgb A1c MFr Bld 5.4 4.8 - 5.6 %    Comment: (NOTE) Pre diabetes:          5.7%-6.4% Diabetes:              >6.4% Glycemic control for   <7.0% adults with diabetes    Mean Plasma Glucose 108.28 mg/dL    Comment: Performed at [facility] [address] [place], [place] [address]  TSH     Status: None   Collection  Time: 03/28/18  6:04 AM  Result Value Ref Range   TSH 1.644 0.350 - 4.500 uIU/mL    Comment: Performed by a 3rd Generation assay with a functional sensitivity of <=0.01 uIU/mL. Performed at [facility], [place] [address] [place], [place] [address]    Blood Alcohol level:  Lab Results  Component Value Date   ETH <10 03/25/2018   ETH <5 40/97/3532   Metabolic Disorder Labs: Lab Results  Component Value Date   HGBA1C 5.4 03/28/2018   MPG 108.28 03/28/2018   No results found for: PROLACTIN Lab Results  Component Value Date   CHOL 152 03/28/2018   TRIG 99 03/28/2018   HDL 44 03/28/2018   CHOLHDL 3.5 03/28/2018   VLDL 20 03/28/2018   LDLCALC 88 03/28/2018   Physical Findings:  AIMS: Facial and Oral Movements Muscles of Facial Expression: None, normal Lips and Perioral Area: None, normal Jaw: None, normal Tongue: None, normal,Extremity Movements Upper (arms, wrists, hands, fingers): None, normal Lower (legs, knees, ankles, toes): None, normal, Trunk Movements Neck, shoulders, hips: None, normal, Overall Severity Severity of abnormal movements (highest score from questions above): None, normal Incapacitation due  to abnormal movements: None, normal Patient's awareness of abnormal movements (rate only patient's report): No Awareness, Dental Status Current problems with teeth and/or dentures?: No Does patient usually wear dentures?: No  CIWA:  CIWA-Ar Total: 3 COWS:  COWS Total Score: 0  Musculoskeletal: Strength & Muscle Tone: within normal limits Gait & Station: normal Patient leans: N/A  Psychiatric Specialty Exam: Physical Exam  Nursing note and vitals reviewed.   ROS denies headache, no chest pain, no shortness of breath, no vomiting   Blood pressure 114/76, pulse (!) 120, temperature 97.7 F (36.5 C), temperature source Oral, resp. rate 18, height _0  (1.727 m), weight 74.8 kg (165 lb), SpO2 98 %.Body mass index is 25.09 kg/m.  General  Appearance: Improving grooming  Eye Contact:  Good  Speech:  Normal Rate  Volume:  Normal  Mood:  Reports mood is "same"  Affect:  More reactive, less irritable, vaguely anxious  Thought Process:  Linear and Descriptions of Associations: Intact  Orientation:  Other:  fully alert and attentive   Thought Content:  denies hallucinations, no delusions, not internally preoccupied   Suicidal Thoughts:  No denies suicidal plan or intention, contracts for safety on unit, presents future oriented .  Homicidal Thoughts:  No  Memory:  recent and remote grossly intact   Judgement:  Fair  Insight:  Fair  Psychomotor Activity:  Decreased  Concentration:  Concentration: Good and Attention Span: Good  Recall:  Good  Fund of Knowledge:  Good  Language:  Good  Akathisia:  No  Handed:  Right  AIMS (if indicated):     Assets:  Desire for Improvement Resilience  ADL's:  Intact  Cognition:  WNL  Sleep:  Number of Hours: 6.5   Assessment -Patient reports ongoing/worsening depression. Says today, it is not going well at all.   Treatment Plan Summary: Daily contact with patient to assess and evaluate symptoms and progress in treatment   - Continue inpatient hospitalization.  - Will continue today 03/29/2018 plan as below except where it is noted.   -Bipolar II, current episode depressed, without psychotic features             -Continue zyprexa 10 mg po qhs             -Decreased Remeron from 46m po Q hs to Remeron 7.5 mg po Q hs.             -Initiate Prozac 20 mg po daily.             -DC wellbutrin  -Anxiety                        -Continue gabapentin 602mpo TID             -Continue atarax 25100mo q6h prn anxiety  -Insomnia             -Continue trazodone 39m53m qhs prn insomnia  -Encourage participation in groups and therapeutic milieu  -Disposition planning will be ongoing   AgneLindell Spar, PMHNP, FNP-BC 03/29/2018, 1:51 PM  Patient ID: PhilRebeca Allegrale   DOB:  9/30Nov 11, 1987 y4[birth year]   MRN: [id number]

## 2018-03-29 NOTE — B[facility] Group Notes (Signed)
LCSW Group Therapy Note   03/29/2018 1:15pm   Type of Therapy and Topic:  Group Therapy:  Overcoming Obstacles   Participation Level:  Did Not Attend--pt invited. Chose to remain in bed.    Description of Group:    In this group patients will be encouraged to explore what they see as obstacles to their own wellness and recovery. They will be guided to discuss their thoughts, feelings, and behaviors related to these obstacles. The group will process together ways to cope with barriers, with attention given to specific choices patients can make. Each patient will be challenged to identify changes they are motivated to make in order to overcome their obstacles. This group will be process-oriented, with patients participating in exploration of their own experiences as well as giving and receiving support and challenge from other group members.   Therapeutic Goals: 1. Patient will identify personal and current obstacles as they relate to admission. 2. Patient will identify barriers that currently interfere with their wellness or overcoming obstacles.  3. Patient will identify feelings, thought process and behaviors related to these barriers. 4. Patient will identify two changes they are willing to make to overcome these obstacles:      Summary of Patient Progress   x   Therapeutic Modalities:   Cognitive Behavioral Therapy Solution Focused Therapy Motivational Interviewing Relapse Prevention Therapy  Avelina Laine, LCSW 03/29/2018 2:37 PM

## 2018-03-30 NOTE — Progress Notes (Signed)
Psychoeducational Group Note  Date:  03/30/2018 Time:  2212  Group Topic/Focus:  Wrap-Up Group:   The focus of this group is to help patients review their daily goal of treatment and discuss progress on daily workbooks.  Participation Level: Did Not Attend  Participation Quality:  Not Applicable  Affect:  Not Applicable  Cognitive:  Not Applicable  Insight:  Not Applicable  Engagement in Group: Not Applicable  Additional Comments:  The patient did not attend group this evening since he remained in his bedroom.   Archie Balboa S 03/30/2018, 10:12 PM

## 2018-03-30 NOTE — BHH Group Notes (Signed)
[facility] [employer] Group Therapy 03/30/2018 1:15pm  Type of Therapy: Mental Health Association Presentation  Participation Level: Invited. Chose to remain in bed.   Avelina Laine, LCSW 03/30/2018 1:24 PM

## 2018-03-30 NOTE — Progress Notes (Signed)
[facility] MD Progress Note  03/30/2018 1:35 PM Brian [name]  MRN:  [id number]  Subjective: [name] reports, "I'm feeling a little better. I just know & have made up my mind to get out of this funk. I'm still worrying a lot. I messed up pretty bad this time. I have not been really going to group sessions.  Brian [name] is a 32 y/o M with history of Bipolar II and polysubstance abuse who was [facility] voluntarily from [facility] with worsening depression, SI with plan to overdose on heroin and recent attempt, and recent relapse on multiple substances including amphetamines, opiates, and cocaine. Pt was medically cleared and then transferred to [facility] for additional treatment. Upon initial interview, pt shares, "A long while ago I got hooked on pain pills, and in 2015 when my wife left, I started heroin. I got clear for a while when I moved out to [place] and got on methadone, but in February I started using meth and I had gotten clean about 1 month ago and got to an [place], but I thought the depression would go away and it didn't and I relapsed again.  Today, Brian [name] is seen, chart reviewed. The chart findings discussed with the treatment team. He is alert, oriented & aware of situation. He is visible on the unit. He says he is feeling a lot better. Says, he has made up his mind to get out of this funk. He is still worrying about what will happen to him after discharge as he has lost his place of residence. He says he has a pending court date for probation violation & will not be able to get into any substance abuse treatment program as a result. He hopes once the case is over, he will have another shot to some treatment program.  Patient is in agreement to continue his current plan of care already in progress. He is encouraged to continue to attend & participate in the group sessions. He denies any SIHI. He denies any SIHI, AVH, delusional thoughts or paranoia. He does not appear to be responding to any  internal stimuli.  Principal Problem: Substance induced mood disorder ([place])  Diagnosis:   Patient Active Problem List   Diagnosis Date Noted  . Substance induced mood disorder ([place]) [F19.94] 03/26/2018    Priority: High  . Opioid use disorder, severe, dependence ([place]) [F11.20] 03/08/2015    Priority: High  . Amphetamine use disorder, severe, dependence ([place]) [F15.20] 03/26/2018    Priority: Medium  . Bipolar II disorder, severe, depressed, with anxious distress ([place]) [F31.81] 03/25/2018  . Cellulitis [L03.90] 05/20/2015  . Dysuria [R30.0] 05/20/2015  . Smoker [F17.200] 05/20/2015  . Homeless single person [Z59.0] 05/20/2015  . Drug abuse ([place]) [F19.10]   . Ileus of unspecified type ([place]) [K56.7] 05/28/2014  . Abdominal pain [R10.9] 05/28/2014  . AKI (acute kidney injury) ([place]) [N17.9] 05/28/2014  . Esophagitis, acute [K20.9] 05/28/2014  . Polysubstance abuse ([place]) [F19.10] 05/28/2014  . Hematemesis [K92.0] 05/28/2014   Total Time spent with patient: 15 minutes  Past Psychiatric History: See H&P  Past Medical History:  Past Medical History:  Diagnosis Date  . Alcohol abuse   . Anxiety   . Cocaine abuse ([place])   . Depression   . Kidney stones   . Narcotic abuse ([place])   . Peptic ulcer   . Polysubstance abuse ([place])   . Renal disorder    kidney stones    Past Surgical History:  Procedure Laterality Date  . ESOPHAGOGASTRODUODENOSCOPY  N/A 05/29/2014   Procedure: ESOPHAGOGASTRODUODENOSCOPY (EGD) ;  Surgeon: Beryle Beams, MD;  Location: [facility] ENDOSCOPY;  Service: Endoscopy;  Laterality: N/A;  check with Dr. Collene Mares about sedation type/timinng - I recommend MAC  . HAND SURGERY  2006   Family History: History reviewed. No pertinent family history.  Family Psychiatric  History: See H&P  Social History:  Social History   Substance and Sexual Activity  Alcohol Use Not Currently     Social History   Substance and Sexual Activity  Drug Use Yes  . Types: Marijuana, Cocaine, IV    Comment: OPIATES    Social History   Socioeconomic History  . Marital status: Single    Spouse name: Not on file  . Number of children: Not on file  . Years of education: Not on file  . Highest education level: Not on file  Occupational History  . Not on file  Social Needs  . Financial resource strain: Not on file  . Food insecurity:    Worry: Not on file    Inability: Not on file  . Transportation needs:    Medical: Not on file    Non-medical: Not on file  Tobacco Use  . Smoking status: Current Every Day Smoker    Packs/day: 0.50    Types: Cigarettes  . Smokeless tobacco: Never Used  Substance and Sexual Activity  . Alcohol use: Not Currently  . Drug use: Yes    Types: Marijuana, Cocaine, IV    Comment: OPIATES  . Sexual activity: Not Currently    Birth control/protection: Condom  Lifestyle  . Physical activity:    Days per week: Not on file    Minutes per session: Not on file  . Stress: Not on file  Relationships  . Social connections:    Talks on phone: Not on file    Gets together: Not on file    Attends religious service: Not on file    Active member of club or organization: Not on file    Attends meetings of clubs or organizations: Not on file    Relationship status: Not on file  Other Topics Concern  . Not on file  Social History Narrative   Lives with a friend   Lost house and custody of son 9broke up with fiancee)   Animator Social History:   Sleep: Good  Appetite:  Good  Current Medications: Current Facility-Administered Medications  Medication Dose Route Frequency Provider Last Rate Last Dose  . famotidine (PEPCID) tablet 20 mg  20 mg Oral BID Rankin, Shuvon B, NP   20 mg at 03/30/18 0859  . FLUoxetine (PROZAC) capsule 20 mg  20 mg Oral Daily Lindell Spar I, NP   20 mg at 03/30/18 0859  . gabapentin (NEURONTIN) capsule 600 mg  600 mg Oral TID Rankin, Shuvon B, NP   600 mg at 03/30/18 1303  . hydrOXYzine  (ATARAX/VISTARIL) tablet 25 mg  25 mg Oral Q6H PRN Laverle Hobby, PA-C   25 mg at 03/28/18 1813  . mirtazapine (REMERON) tablet 7.5 mg  7.5 mg Oral QHS Brynda Heick I, NP   7.5 mg at 03/29/18 2132  . nicotine (NICODERM CQ - dosed in mg/24 hours) patch 21 mg  21 mg Transdermal Daily Rankin, Shuvon B, NP   21 mg at 03/26/18 0752  . OLANZapine (ZYPREXA) tablet 10 mg  10 mg Oral QHS Lindell Spar I, NP   10 mg at 03/29/18 2132  .  traZODone (DESYREL) tablet 50 mg  50 mg Oral QHS PRN Lindell Spar I, NP   50 mg at 03/29/18 2132   Lab Results:  No results found for this or any previous visit (from the past 48 hour(s)). Blood Alcohol level:  Lab Results  Component Value Date   ETH <10 03/25/2018   ETH <5 16/07/9603   Metabolic Disorder Labs: Lab Results  Component Value Date   HGBA1C 5.4 03/28/2018   MPG 108.28 03/28/2018   No results found for: PROLACTIN Lab Results  Component Value Date   CHOL 152 03/28/2018   TRIG 99 03/28/2018   HDL 44 03/28/2018   CHOLHDL 3.5 03/28/2018   VLDL 20 03/28/2018   LDLCALC 88 03/28/2018   Physical Findings:  AIMS: Facial and Oral Movements Muscles of Facial Expression: None, normal Lips and Perioral Area: None, normal Jaw: None, normal Tongue: None, normal,Extremity Movements Upper (arms, wrists, hands, fingers): None, normal Lower (legs, knees, ankles, toes): None, normal, Trunk Movements Neck, shoulders, hips: None, normal, Overall Severity Severity of abnormal movements (highest score from questions above): None, normal Incapacitation due to abnormal movements: None, normal Patient's awareness of abnormal movements (rate only patient's report): No Awareness, Dental Status Current problems with teeth and/or dentures?: No Does patient usually wear dentures?: No  CIWA:  CIWA-Ar Total: 3 COWS:  COWS Total Score: 0  Musculoskeletal: Strength & Muscle Tone: within normal limits Gait & Station: normal Patient leans: N/A  Psychiatric Specialty  Exam: Physical Exam  Nursing note and vitals reviewed.   ROS denies headache, no chest pain, no shortness of breath, no vomiting   Blood pressure 114/76, pulse (!) 120, temperature 97.7 F (36.5 C), temperature source Oral, resp. rate 18, height _0  (1.727 m), weight 74.8 kg (165 lb), SpO2 98 %.Body mass index is 25.09 kg/m.  General Appearance: Improving grooming  Eye Contact:  Good  Speech:  Normal Rate  Volume:  Normal  Mood:  Reports mood is "same"  Affect:  More reactive, less irritable, vaguely anxious  Thought Process:  Linear and Descriptions of Associations: Intact  Orientation:  Other:  fully alert and attentive   Thought Content:  denies hallucinations, no delusions, not internally preoccupied   Suicidal Thoughts:  No denies suicidal plan or intention, contracts for safety on unit, presents future oriented .  Homicidal Thoughts:  No  Memory:  recent and remote grossly intact   Judgement:  Fair  Insight:  Fair  Psychomotor Activity:  Decreased  Concentration:  Concentration: Good and Attention Span: Good  Recall:  Good  Fund of Knowledge:  Good  Language:  Good  Akathisia:  No  Handed:  Right  AIMS (if indicated):     Assets:  Desire for Improvement Resilience  ADL's:  Intact  Cognition:  WNL  Sleep:  Number of Hours: 6.75   Assessment -Patient reports improved depression today.   Treatment Plan Summary: Daily contact with patient to assess and evaluate symptoms and progress in treatment   - Continue inpatient hospitalization.  - Will continue today 03/30/2018 plan as below except where it is noted.   -Bipolar II, current episode depressed, without psychotic features             -Continue zyprexa 10 mg po qhs             -Continue Remeron from 27m po Q hs to Remeron 7.5 mg po Q hs.             -Continue Prozac  20 mg po daily.             -DC'ed wellbutrin  -Anxiety                        -Continue gabapentin 668m po TID             -Continue atarax  226mpo q6h prn anxiety  -Insomnia             -Continue trazodone 5061mo qhs prn insomnia  -Encourage participation in groups and therapeutic milieu  -Disposition planning will be ongoing   AgnLindell SparP, PMHNP, FNP-BC 03/30/2018, 1:35 PM  Patient ID: PhiRebeca Allegraale   DOB: [birth year] 43o.   MRN: [id number]

## 2018-03-30 NOTE — BHH Suicide Risk Assessment (Addendum)
[place] INPATIENT:  Family/Significant Other Suicide Prevention Education  Suicide Prevention Education:  Contact Attempts: Dallie Dad [phone] (pt's mother) has been identified by the patient as the family member/significant other with whom the patient will be residing, and identified as the person(s) who will aid the patient in the event of a mental health crisis.  With written consent from the patient, two attempts were made to provide suicide prevention education, prior to and/or following the patient's discharge.  We were unsuccessful in providing suicide prevention education.  A suicide education pamphlet was given to the patient to share with family/significant other.  Date and time of first attempt: 03/29/18 at 4:00PM Date and time of second attempt: 03/30/18 at 2:58PM (voicemail left requesting call back at her earliest convenience).   Avelina Laine LCSW 03/30/2018, 2:59 PM   SPE completed with pt's mother. Pt is unable to go home to her due to his past behaviors. She is supportive of pt going to court on Friday and then going to [name] on Thursday for possible admission. She does not have safety concerns other than the potential for relapse. "I know he's an addict and there's always a chance. I hope he starts making better choices because he has a son that loves him and doesn't get to see him."  Nira Conn S. Ouida Sills, MSW, LCSW Clinical Social Worker 04/01/2018 9:49 AM

## 2018-03-30 NOTE — Progress Notes (Signed)
Patient ID: Brian [name], male   DOB: Dec 13, 1985, 32 y.o.   MRN: [id number]   D.Patient came out for his meds this AM at 9:30 AM. He denied SI, HI and AVH. He stated he is not having any withdrawal symptoms. Patients main concern is his discharge. Patient to be discharged tomorrow.   A: Patient given emotional support from RN. Patient given medications per MD orders. Patient encouraged to attend groups and unit activities. Patient encouraged to come to staff with any questions or concerns.  R: Patient remains cooperative and appropriate. Will continue to monitor patient for safety.

## 2018-03-30 NOTE — Progress Notes (Signed)
Pt was in his room in bed at the beginning of the shift and did not come out until AA group was ended.  He contracts for Probation officer.  He denies HI/AVH.  He denies any withdrawal symptoms at this time.  He is unsure what his discharge plans are at this time.  He voiced no needs or concerns.  Pt was encouraged to be out of bed so that he would be able to sleep tonight.  Support and encouragement offered.  Discharge plans are in process.  Safety maintained with q15 minute checks.

## 2018-03-31 MED ORDER — FLUOXETINE HCL 20 MG PO CAPS: 20.0000 mg | ORAL_CAPSULE | Freq: Every day | ORAL | 0 refills | Status: DC

## 2018-03-31 MED ORDER — HYDROXYZINE HCL 25 MG PO TABS: 25.0000 mg | ORAL_TABLET | Freq: Four times a day (QID) | ORAL | 0 refills | Status: DC | PRN

## 2018-03-31 MED ORDER — GABAPENTIN 300 MG PO CAPS: 600.0000 mg | ORAL_CAPSULE | Freq: Three times a day (TID) | ORAL | 0 refills | Status: DC

## 2018-03-31 MED ORDER — TRAZODONE HCL 50 MG PO TABS: 50.0000 mg | ORAL_TABLET | Freq: Every evening | ORAL | 0 refills | Status: DC | PRN

## 2018-03-31 MED ORDER — FAMOTIDINE 20 MG PO TABS: 20.0000 mg | ORAL_TABLET | Freq: Two times a day (BID) | ORAL | 0 refills | Status: DC

## 2018-03-31 MED ORDER — OLANZAPINE 10 MG PO TABS: 10.0000 mg | ORAL_TABLET | Freq: Every day | ORAL | 0 refills | Status: DC

## 2018-03-31 MED ORDER — MIRTAZAPINE 7.5 MG PO TABS: 7.5000 mg | ORAL_TABLET | Freq: Every day | ORAL | 0 refills | Status: DC

## 2018-03-31 MED ORDER — NICOTINE 21 MG/24HR TD PT24: 21.0000 mg | MEDICATED_PATCH | Freq: Every day | TRANSDERMAL | 0 refills | Status: DC

## 2018-03-31 NOTE — Progress Notes (Signed)
Pt did not attend goals and orientation group this morning

## 2018-03-31 NOTE — Progress Notes (Signed)
Recreation Therapy Notes  Date: 6.5.19 Time: 0930 Location: 300 Hall Dayroom  Group Topic: Stress Management  Goal Area(s) Addresses:  Patient will verbalize importance of using healthy stress management.  Patient will identify positive emotions associated with healthy stress management.   Intervention: Stress Management  Activity :  Guided Imagery.  LRT introduced patients to the stress management technique of guided imagery.  LRT read a script that lead patients on mental vacation to their peaceful place.  Patients were to follow along as the script was read to engage in the activity.  Education:  Stress Management, Discharge Planning.   Education Outcome: Acknowledges edcuation/In group clarification offered/Needs additional education  Clinical Observations/Feedback:  Pt did not attend group.     Victorino Sparrow, LRT/CTRS         Ria Comment, Gino Garrabrant A 03/31/2018 10:57 AM

## 2018-03-31 NOTE — Tx Team (Signed)
Interdisciplinary Treatment and Diagnostic Plan Update  03/31/2018 Time of Session: [place] MRN: [id number]  Principal Diagnosis: MDD, recurrent, severe  Secondary Diagnoses: Principal Problem:   Substance induced mood disorder ([place]) Active Problems:   Opioid use disorder, severe, dependence ([place])   Bipolar II disorder, severe, depressed, with anxious distress (HCC)   Amphetamine use disorder, severe, dependence ([place])   Current Medications:  Current Facility-Administered Medications  Medication Dose Route Frequency Provider Last Rate Last Dose  . famotidine (PEPCID) tablet 20 mg  20 mg Oral BID Rankin, Shuvon B, NP   20 mg at 03/30/18 0859  . FLUoxetine (PROZAC) capsule 20 mg  20 mg Oral Daily Lindell Spar I, NP   20 mg at 03/31/18 0816  . gabapentin (NEURONTIN) capsule 600 mg  600 mg Oral TID Rankin, Shuvon B, NP   600 mg at 03/31/18 0816  . hydrOXYzine (ATARAX/VISTARIL) tablet 25 mg  25 mg Oral Q6H PRN Laverle Hobby, PA-C   25 mg at 03/28/18 1813  . mirtazapine (REMERON) tablet 7.5 mg  7.5 mg Oral QHS Nwoko, Agnes I, NP   7.5 mg at 03/30/18 2102  . nicotine (NICODERM CQ - dosed in mg/24 hours) patch 21 mg  21 mg Transdermal Daily Rankin, Shuvon B, NP   21 mg at 03/26/18 0752  . OLANZapine (ZYPREXA) tablet 10 mg  10 mg Oral QHS Lindell Spar I, NP   10 mg at 03/30/18 2103  . traZODone (DESYREL) tablet 50 mg  50 mg Oral QHS PRN Lindell Spar I, NP   50 mg at 03/30/18 2102   PTA Medications: Medications Prior to Admission  Medication Sig Dispense Refill Last Dose  . buPROPion (WELLBUTRIN XL) 150 MG 24 hr tablet Take 150 mg by mouth daily.   Past Month at Unknown time  . cephALEXin (KEFLEX) 500 MG capsule Take 1 capsule (500 mg total) by mouth 4 (four) times daily. (Patient not taking: Reported on 03/25/2018) 28 capsule 0 Completed Course at Unknown time  . gabapentin (NEURONTIN) 600 MG tablet Take 600 mg by mouth 3 (three) times daily.   Past Month at Unknown time  .  hydrOXYzine (ATARAX/VISTARIL) 25 MG tablet Take 1 tablet (25 mg total) by mouth every 6 (six) hours. 12 tablet 0   . ibuprofen (ADVIL,MOTRIN) 600 MG tablet Take 1 tablet (600 mg total) by mouth every 6 (six) hours as needed. (Patient not taking: Reported on 05/20/2015) 30 tablet 0 Completed Course at Unknown time  . predniSONE (STERAPRED UNI-PAK 21 TAB) 10 MG (21) TBPK tablet Take by mouth daily. Take 6 tabs by mouth daily  for 2 days, then 5 tabs for 2 days, then 4 tabs for 2 days, then 3 tabs for 2 days, 2 tabs for 2 days, then 1 tab by mouth daily for 2 days 42 tablet 0   . traZODone (DESYREL) 150 MG tablet Take 150 mg by mouth at bedtime as needed.   Past Month at Unknown time  . [DISCONTINUED] famotidine (PEPCID) 20 MG tablet Take 1 tablet (20 mg total) by mouth 2 (two) times daily. 30 tablet 0     Patient Stressors: Arts development officer issue Marital or family conflict Medication change or noncompliance Substance abuse  Patient Strengths: Ability for insight Average or above average intelligence Communication skills General fund of knowledge Motivation for treatment/growth Physical Health Supportive family/friends Work skills  Treatment Modalities: Medication Management, Group therapy, Case management,  1 to 1 session with clinician, Psychoeducation, Recreational therapy.  Physician Treatment Plan for Primary Diagnosis: MDD, recurrent, severe  Medication Management: Evaluate patient's response, side effects, and tolerance of medication regimen.  Therapeutic Interventions: 1 to 1 sessions, Unit Group sessions and Medication administration.  Evaluation of Outcomes: Progressing  Physician Treatment Plan for Secondary Diagnosis: Principal Problem:   Substance induced mood disorder ([place]) Active Problems:   Opioid use disorder, severe, dependence (HCC)   Bipolar II disorder, severe, depressed, with anxious distress (HCC)   Amphetamine use disorder, severe, dependence  ([place])  Medication Management: Evaluate patient's response, side effects, and tolerance of medication regimen.  Therapeutic Interventions: 1 to 1 sessions, Unit Group sessions and Medication administration.  Evaluation of Outcomes: Progressing  RN Treatment Plan for Primary Diagnosis: MDD, recurrent, severe Long Term Goal(s): Knowledge of disease and therapeutic regimen to maintain health will improve  Short Term Goals: Ability to remain free from injury will improve, Ability to demonstrate self-control, Ability to disclose and discuss suicidal ideas and Ability to identify and develop effective coping behaviors will improve  Medication Management: RN will administer medications as ordered by provider, will assess and evaluate patient's response and provide education to patient for prescribed medication. RN will report any adverse and/or side effects to prescribing provider.  Therapeutic Interventions: 1 on 1 counseling sessions, Psychoeducation, Medication administration, Evaluate responses to treatment, Monitor vital signs and CBGs as ordered, Perform/monitor CIWA, COWS, AIMS and Fall Risk screenings as ordered, Perform wound care treatments as ordered.  Evaluation of Outcomes: Progressing  LCSW Treatment Plan for Primary Diagnosis: MDD, recurrent, severe Long Term Goal(s): Safe transition to appropriate next level of care at discharge, Engage patient in therapeutic group addressing interpersonal concerns.  Short Term Goals: Engage patient in aftercare planning with referrals and resources, Facilitate patient progression through stages of change regarding substance use diagnoses and concerns and Identify triggers associated with mental health/substance abuse issues  Therapeutic Interventions: Assess for all discharge needs, 1 to 1 time with Social worker, Explore available resources and support systems, Assess for adequacy in community support network, Educate family and significant other(s)  on suicide prevention, Complete Psychosocial Assessment, Interpersonal group therapy.  Evaluation of Outcomes: Progressing  Progress in Treatment: Attending groups: No. New to unit. Continuing to assess.  Participating in groups: No. Taking medication as prescribed: Yes. Toleration medication: Yes. Family/Significant other contact made: No, will contact:  family member if patient consents to collateral contact.  Patient understands diagnosis: Yes. Discussing patient identified problems/goals with staff: Yes. Medical problems stabilized or resolved: Yes. Denies suicidal/homicidal ideation: Yes. Issues/concerns per patient self-inventory: No. Other: n/a   New problem(s) identified: No, Describe:  n/a  New Short Term/Long Term Goal(s): detox, medication management for mood stabilization; elimination of SI thoughts; development of comprehensive mental wellness/sobriety plan.   Patient Goals:  "try to get stable. To get on meds to help with my depression."   Discharge Plan or Barriers: Pt has court in [place] on Friday 04/02/18 at 8am for probation violation and is not eligible for [facility] until he goes to his court date. He has [facility] screening on 6/13 (Thursday) at [place] appt scheduled as well. He has  [place] pamphlet, Mobile Crisis information, and AA/NA information provided to patient for additional community support and resources.   Reason for Continuation of Hospitalization: Anxiety Depression Medication stabilization  Estimated Length of Stay: Thursday, 04/01/18  Attendees: Patient:  03/31/2018 10:33 AM  Physician: Dr. Mallie Darting MD; Dr. Nancy Fetter MD 03/31/2018 10:33 AM  Nursing: Legrand Como RN; Clarise Cruz RN 03/31/2018 10:33 AM  RN Care Manager:x 03/31/2018 10:33 AM  Social Worker: Janice Norrie LCSW 03/31/2018 10:33 AM  Recreational Therapist: x 03/31/2018 10:33 AM  Other: Lindell Spar NP 03/31/2018 10:33 AM  Other:  03/31/2018 10:33 AM  Other: 03/31/2018 10:33 AM    Scribe for Treatment  Team: Avelina Laine, LCSW 03/31/2018 10:33 AM

## 2018-03-31 NOTE — BHH Group Notes (Signed)
LCSW Group Therapy Note 03/31/2018 3:12 PM  Type of Therapy and Topic: Group Therapy: Feelings around Relapse and Recovery  Participation Level: Did Not Attend   Description of Group:  Patients in this group will discuss emotions they experience before and after a relapse. They will process how experiencing these feelings, or avoidance of experiencing them, relates to having a relapse. Facilitator will guide patients to explore emotions they have related to recovery. Patients will be encouraged to process which emotions are more powerful. They will be guided to discuss the emotional reaction significant others in their lives may have to their relapse or recovery. Patients will be assisted in exploring ways to respond to the emotions of others without this contributing to a relapse.  Therapeutic Goals: 1. Patient will identify two or more emotions that lead to a relapse for them 2. Patient will identify two emotions that result when they relapse 3. Patient will identify two emotions related to recovery 4. Patient will demonstrate ability to communicate their needs through discussion and/or role plays  Summary of Patient Progress:  Invited, chose not to attend.    Therapeutic Modalities:  Cognitive Behavioral Therapy Solution-Focused Therapy Assertiveness Training Relapse Prevention Therapy   Theresa Duty Clinical Social Worker

## 2018-03-31 NOTE — Discharge Summary (Addendum)
Physician Discharge Summary Note  Patient:  Brian [name] is an 32 y.o., male  MRN:  [id number]  DOB:  1986-03-06  Patient phone:  [phone] (home)   Patient address:   [place] [address],   Total Time spent with patient: Greater than 30 minutes  Date of Admission:  03/25/2018  Date of Discharge: 04-01-18  Reason for Admission: Worsening depression, SI with plan to overdose on heroin and recent attempt, and recent relapse on multiple substances including amphetamines, opiates, and cocaine.  Principal Problem: Substance induced mood disorder [facility])  Discharge Diagnoses: Patient Active Problem List   Diagnosis Date Noted  . Substance induced mood disorder ([place]) [F19.94] 03/26/2018    Priority: High  . Opioid use disorder, severe, dependence ([place]) [F11.20] 03/08/2015    Priority: High  . Amphetamine use disorder, severe, dependence ([place]) [F15.20] 03/26/2018    Priority: Medium  . Bipolar II disorder, severe, depressed, with anxious distress ([place]) [F31.81] 03/25/2018  . Cellulitis [L03.90] 05/20/2015  . Dysuria [R30.0] 05/20/2015  . Smoker [F17.200] 05/20/2015  . Homeless single person [Z59.0] 05/20/2015  . Drug abuse ([place]) [F19.10]   . Ileus of unspecified type ([place]) [K56.7] 05/28/2014  . Abdominal pain [R10.9] 05/28/2014  . AKI (acute kidney injury) ([place]) [N17.9] 05/28/2014  . Esophagitis, acute [K20.9] 05/28/2014  . Polysubstance abuse ([place]) [F19.10] 05/28/2014  . Hematemesis [K92.0] 05/28/2014   Past Psychiatric History: Opioid use disorder, Amphetamine use disorder,Substance induced mood disorder.  Past Medical History:  Past Medical History:  Diagnosis Date  . Alcohol abuse   . Anxiety   . Cocaine abuse ([place])   . Depression   . Kidney stones   . Narcotic abuse ([place])   . Peptic ulcer   . Polysubstance abuse ([place])   . Renal disorder    kidney stones    Past Surgical History:  Procedure Laterality Date  .  ESOPHAGOGASTRODUODENOSCOPY N/A 05/29/2014   Procedure: ESOPHAGOGASTRODUODENOSCOPY (EGD) ;  Surgeon: Beryle Beams, MD;  Location: [facility] ENDOSCOPY;  Service: Endoscopy;  Laterality: N/A;  check with Dr. Collene Mares about sedation type/timinng - I recommend MAC  . HAND SURGERY  2006   Family History: History reviewed. No pertinent family history.  Family Psychiatric  History: See H&P.  Social History:  Social History   Substance and Sexual Activity  Alcohol Use Not Currently     Social History   Substance and Sexual Activity  Drug Use Yes  . Types: Marijuana, Cocaine, IV   Comment: OPIATES    Social History   Socioeconomic History  . Marital status: Single    Spouse name: Not on file  . Number of children: Not on file  . Years of education: Not on file  . Highest education level: Not on file  Occupational History  . Not on file  Social Needs  . Financial resource strain: Not on file  . Food insecurity:    Worry: Not on file    Inability: Not on file  . Transportation needs:    Medical: Not on file    Non-medical: Not on file  Tobacco Use  . Smoking status: Current Every Day Smoker    Packs/day: 0.50    Types: Cigarettes  . Smokeless tobacco: Never Used  Substance and Sexual Activity  . Alcohol use: Not Currently  . Drug use: Yes    Types: Marijuana, Cocaine, IV    Comment: OPIATES  . Sexual activity: Not Currently    Birth control/protection: Condom  Lifestyle  .  Physical activity:    Days per week: Not on file    Minutes per session: Not on file  . Stress: Not on file  Relationships  . Social connections:    Talks on phone: Not on file    Gets together: Not on file    Attends religious service: Not on file    Active member of club or organization: Not on file    Attends meetings of clubs or organizations: Not on file    Relationship status: Not on file  Other Topics Concern  . Not on file  Social History Narrative   Lives with a friend   Lost house and custody  of son 9broke up with fiancee)   Nature conservation officer   Hospital Course: (Per Md's discharge SRA): Brian [name] is a 32 y/o M with history of Bipolar II and polysubstance abuse who [facility] with worsening depression, SI with plan to overdose on heroin and recent attempt, and recent relapse on multiple substances including amphetamines, opiates, and cocaine. Pt was medically cleared and then transferred to [facility] for additional treatment.He was started on regimen of zyprexa, remeron, and eventually prozac was added.Wellbutrin was discontinued.He reported incremental improvement of his presenting symptoms during his stay. He met with SW team about referral to substance use treatments.  Besides the use of Zyprexa 10 mg for mood control, Remeron 7.5 mg for insomnia & Prozac 20 mg for depression, Brian [name] was also medicated & discharged on; Gabapentin 300 mg for agitation, Hydroxyzine 25 mg prn for anxiety, Nicotine patch 21 mg for smoking ceasstation & Trazodone 50 mg prn for insomnia. He was enrolled & participated in the group counseling sessions being offered & held on this unit. He learned coping skills. He received/discharged other medication regimen for the other medical issues (GERD) presented. He tolerated his treatment regimen without any adverse effects or reactions reported.  Today upon his discharge evaluation, pt shares, "It's going okay." He denies any specific concerns today aside from some anxiety about his upcoming court date. He is sleeping well. His appetite is good. He denies physical complaints. He denies SI/HI/AH/VH. He reports that his medications have been helpful and he is in agreement to continue his current regimen without changes. He has an intake screening at [facility] on 6/13 which he plans to attend. He will receive outpatient services with [facility]. He plans to stay in the [place] area in a shelter. He was able to engage in safety planning including  plan to return to [facility] or contact emergency services if he feels unable to maintain his own safety or the safety of others. Pt had no further questions, comments, or concerns.  Upon discharge, Brian [name] presents mentally & medically stable. He will continue mental health care & medication management including screening for further substance abuse treatment as noted below. He is provided with all the necessary information needed to make this appointment without problems. He received from the [place], a 7 days worth supply samples of his [facility] discharge medications. He left [facility] with all personal belongings in no apparent distress. Transportation per bus. [place] assisted with bus pass.  Physical Findings: AIMS: Facial and Oral Movements Muscles of Facial Expression: None, normal Lips and Perioral Area: None, normal Jaw: None, normal Tongue: None, normal,Extremity Movements Upper (arms, wrists, hands, fingers): None, normal Lower (legs, knees, ankles, toes): None, normal, Trunk Movements Neck, shoulders, hips: None, normal, Overall Severity Severity of abnormal movements (highest score from questions above): None, normal Incapacitation  due to abnormal movements: None, normal Patient's awareness of abnormal movements (rate only patient's report): No Awareness, Dental Status Current problems with teeth and/or dentures?: No Does patient usually wear dentures?: No  CIWA:  CIWA-Ar Total: 3 COWS:  COWS Total Score: 0  Musculoskeletal: Strength & Muscle Tone: within normal limits Gait & Station: normal Patient leans: N/A  Psychiatric Specialty Exam: Physical Exam  Constitutional: He appears well-developed.  HENT:  Head: Normocephalic.  Eyes: Pupils are equal, round, and reactive to light.  Neck: Normal range of motion.  Cardiovascular: Normal rate.  Respiratory: Effort normal.  GI: Soft.  Genitourinary:  Genitourinary Comments: Deferred  Musculoskeletal: Normal range of motion.   Neurological: He is alert.  Skin: Skin is warm.    Review of Systems  Constitutional: Negative.   HENT: Negative.   Eyes: Negative.   Respiratory: Negative.   Cardiovascular: Negative.   Gastrointestinal: Negative.   Genitourinary: Negative.   Musculoskeletal: Negative.   Skin: Negative.   Neurological: Negative.   Endo/Heme/Allergies: Negative.   Psychiatric/Behavioral: Positive for depression (Stabilized with medication prior to discharge) and substance abuse (Hx. Opioid use disorder (stable)). Negative for hallucinations, memory loss and suicidal ideas. The patient has insomnia (Stabilized with medication prior to discharge). The patient is not nervous/anxious.     Blood pressure 126/80, pulse 75, temperature 98.2 F (36.8 C), temperature source Oral, resp. rate 18, height _0  (1.727 m), weight 74.8 kg (165 lb), SpO2 98 %.Body mass index is 25.09 kg/m.  See Md's SRA   Have you used any form of tobacco in the last 30 days? (Cigarettes, Smokeless Tobacco, Cigars, and/or Pipes): Yes  Has this patient used any form of tobacco in the last 30 days? (Cigarettes, Smokeless Tobacco, Cigars, and/or Pipes): Yes, an FDA-approved tobacco cessation medication was offered at discharge.  Blood Alcohol level:  Lab Results  Component Value Date   ETH <10 03/25/2018   ETH <5 78/67/6720    Metabolic Disorder Labs:  Lab Results  Component Value Date   HGBA1C 5.4 03/28/2018   MPG 108.28 03/28/2018   No results found for: PROLACTIN Lab Results  Component Value Date   CHOL 152 03/28/2018   TRIG 99 03/28/2018   HDL 44 03/28/2018   CHOLHDL 3.5 03/28/2018   VLDL 20 03/28/2018   LDLCALC 88 03/28/2018   See Psychiatric Specialty Exam and Suicide Risk Assessment completed by Attending Physician prior to discharge.  Discharge destination:  Home  Is patient on multiple antipsychotic therapies at discharge:  No   Has Patient had three or more failed trials of antipsychotic monotherapy by  history:  No  Recommended Plan for Multiple Antipsychotic Therapies: NA  Allergies as of 03/31/2018      Reactions   Fish Allergy Anaphylaxis      Medication List    STOP taking these medications   buPROPion 150 MG 24 hr tablet Commonly known as:  WELLBUTRIN XL   cephALEXin 500 MG capsule Commonly known as:  KEFLEX   gabapentin 600 MG tablet Commonly known as:  NEURONTIN Replaced by:  gabapentin 300 MG capsule   ibuprofen 600 MG tablet Commonly known as:  ADVIL,MOTRIN   predniSONE 10 MG (21) Tbpk tablet Commonly known as:  STERAPRED UNI-PAK 21 TAB     TAKE these medications     Indication  famotidine 20 MG tablet Commonly known as:  PEPCID Take 1 tablet (20 mg total) by mouth 2 (two) times daily. For acid reflux What changed:  additional instructions  Indication:  Gastroesophageal Reflux Disease   FLUoxetine 20 MG capsule Commonly known as:  PROZAC Take 1 capsule (20 mg total) by mouth daily. For depression Start taking on:  04/01/2018  Indication:  Depression   gabapentin 300 MG capsule Commonly known as:  NEURONTIN Take 2 capsules (600 mg total) by mouth 3 (three) times daily. For agitation Replaces:  gabapentin 600 MG tablet  Indication:  Agitation   hydrOXYzine 25 MG tablet Commonly known as:  ATARAX/VISTARIL Take 1 tablet (25 mg total) by mouth every 6 (six) hours as needed for anxiety. What changed:    when to take this  reasons to take this  Indication:  Feeling Anxious   mirtazapine 7.5 MG tablet Commonly known as:  REMERON Take 1 tablet (7.5 mg total) by mouth at bedtime. For sleep  Indication:  Insomnia   nicotine 21 mg/24hr patch Commonly known as:  NICODERM CQ - dosed in mg/24 hours Place 1 patch (21 mg total) onto the skin daily. (May purchase from over the counter at the pharmacy): For smoking cessation Start taking on:  04/01/2018  Indication:  Nicotine Addiction   OLANZapine 10 MG tablet Commonly known as:  ZYPREXA Take 1 tablet (10  mg total) by mouth at bedtime. For mood control  Indication:  Mood control   traZODone 50 MG tablet Commonly known as:  DESYREL Take 1 tablet (50 mg total) by mouth at bedtime as needed for sleep. What changed:    medication strength  how much to take  reasons to take this  Indication:  Trouble Sleeping      Follow-up Information    [facility] Follow up on 04/08/2018.   Why:  Screening for possible admission on Thursday, 6/13 at 8:00AM. Please bring: photo ID/proof of [employer] residency, 30 day supply of all medications, and clothing. Thank you.  Contact information: [place] [address] [phone]        [facility] Follow [facility].   Why:  Walk in within 3 days of hospital discharge to be assessed for medication management, maintenance program, and therapy/groups. Walk in hours: Monday, Wednesday, Friday 11am-12:30pm. Thank you.  Contact information: [place], [place] [address] Phone: [phone] Fax: [phone]         Follow-up recommendations: Activity:  As tolerated Diet: As recommended by your primary care doctor. Keep all scheduled follow-up appointments as recommended.   Comments: Patient is instructed prior to discharge to: Take all medications as prescribed by his/her mental healthcare provider. Report any adverse effects and or reactions from the medicines to his/her outpatient provider promptly. Patient has been instructed & cautioned: To not engage in alcohol and or illegal drug use while on prescription medicines. In the event of worsening symptoms, patient is instructed to call the crisis hotline, 911 and or go to the nearest ED for appropriate evaluation and treatment of symptoms. To follow-up with his/her primary care provider for your other medical issues, concerns and or health care needs.   Signed: Lindell Spar, NP, PMHNP, FNP-BC 03/31/2018, 9:39 AM   Patient seen, Suicide  Assessment Completed.  Disposition Plan Reviewed

## 2018-03-31 NOTE — Progress Notes (Signed)
Patient ID: Brian [name], male   DOB: 06-15-1986, 32 y.o.   MRN: [id number]  Pt currently presents with an appropriate affect and depressed behavior. Pt currently denies any concerns. Pt states goal is to "go home today."   Pt provided with medications per providers orders. Pt's labs and vitals were monitored throughout the night. Pt given a 1:1 about emotional and mental status. Pt supported and encouraged to express concerns and questions. Pt educated on medications.  Pt's safety ensured with 15 minute and environmental checks. Pt currently denies SI/HI and A/V hallucinations. Pt verbally agrees to seek staff if SI/HI or A/VH occurs and to consult with staff before acting on any harmful thoughts. Per MD, pt to be discharged today. Will continue POC.

## 2018-03-31 NOTE — Progress Notes (Addendum)
  [facility] Adult Case Management Discharge Plan :  Will you be returning to the same living situation after discharge:  No-probably shelter in [facility] per pt. (he has court at 8am on Friday 6/7 in [place]).  At discharge, do you have transportation home?: Yes,  bus pass--pt discharge rescheduled to Thursday per Dr. Nancy Fetter MD.  Do you have the ability to pay for your medications: Yes,  mental health  Release of information consent forms completed and submitted to medical records by CSW.  Patient to Follow up at: Follow-up Information    [facility] Follow up on 04/08/2018.   Why:  Screening for possible admission on Thursday, 6/13 at 8:00AM. Please bring: photo ID/proof of [employer] residency, 30 day supply of all medications, and clothing. Thank you.  Contact information: [place] [address] [phone]        [facility] Follow up.   Why:  Walk in within 3 days of hospital discharge to be assessed for medication management, maintenance program, and therapy/groups. Walk in hours: Monday, Wednesday, Friday 11am-12:30pm. Thank you.  Contact information: [place], [place] [address] Phone: [phone] Fax: [phone]          Next level of care provider has access to [place] and Suicide Prevention discussed: Yes,  SPE completed with pt; contact attempts made with pt's mother. SPI pamphlet and Mobile crisis information provided to pt.   Have you used any form of tobacco in the last 30 days? (Cigarettes, Smokeless Tobacco, Cigars, and/or Pipes): Yes  Has patient been referred to the Quitline?: Patient refused referral  Patient has been referred for addiction treatment: Yes  Avelina Laine, LCSW 03/31/2018, 9:57 AM

## 2018-03-31 NOTE — Progress Notes (Signed)
Pt has been in bed all evening, only getting up to get a snack and his hs medications.  He contracts for Probation officer.  He denies HI/AVH.  He says his withdrawal symptoms are mild at this point.  He voices no needs or concerns.  Support and encouragement offered.  Meds given as ordered.  Discharge plans are in process.  Safety maintained with q15 minute checks.

## 2018-03-31 NOTE — Progress Notes (Signed)
[facility] MD Progress Note  03/31/2018 2:41 PM Brian [name]  MRN:  [id number] Subjective:    Brian [name] is a 32 y/o M with history of Bipolar II and polysubstance abuse who [facility] with worsening depression, SI with plan to overdose on heroin and recent attempt, and recent relapse on multiple substances including amphetamines, opiates, and cocaine. Pt was medically cleared and then transferred to [facility] for additional treatment. He was started on regimen of zyprexa, remeron, and eventually prozac was added. Wellbutrin was discontinued.  He reported incremental improvement of his presenting symptoms during his stay. He met with SW team about referral to substance use treatments.  Today upon evaluation, pt shares, "It's going okay, I guess. I'm still depressed but it's getting better." Pt denies other specific concerns today. He is sleeping well. His appetite is good. He denies other physical complaints. He denies SI/HI/AH/VH. He reports some anxiety about his upcoming court date and uncertain housing at this time. Pt does not feel confident at this time to be able to safety plan for outside the hospital. Discussed with patient that he can work on safety planning today, and we will tentatively plan for his discharge to outpatient level of care tomorrow. Pt was in agreement with the above plan and he had no further questions, comments, or concerns.   Principal Problem: Substance induced mood disorder ([place]) Diagnosis:   Patient Active Problem List   Diagnosis Date Noted  . Amphetamine use disorder, severe, dependence ([place]) [F15.20] 03/26/2018  . Substance induced mood disorder ([place]) [F19.94] 03/26/2018  . Bipolar II disorder, severe, depressed, with anxious distress ([place]) [F31.81] 03/25/2018  . Cellulitis [L03.90] 05/20/2015  . Dysuria [R30.0] 05/20/2015  . Smoker [F17.200] 05/20/2015  . Homeless single person [Z59.0] 05/20/2015  . Drug abuse ([place]) [F19.10]   . Opioid  use disorder, severe, dependence ([place]) [F11.20] 03/08/2015  . Ileus of unspecified type ([place]) [K56.7] 05/28/2014  . Abdominal pain [R10.9] 05/28/2014  . AKI (acute kidney injury) ([place]) [N17.9] 05/28/2014  . Esophagitis, acute [K20.9] 05/28/2014  . Polysubstance abuse ([place]) [F19.10] 05/28/2014  . Hematemesis [K92.0] 05/28/2014   Total Time spent with patient: 30 minutes  Past Psychiatric History: See H&P   Past Medical History:  Past Medical History:  Diagnosis Date  . Alcohol abuse   . Anxiety   . Cocaine abuse ([place])   . Depression   . Kidney stones   . Narcotic abuse ([place])   . Peptic ulcer   . Polysubstance abuse ([place])   . Renal disorder    kidney stones    Past Surgical History:  Procedure Laterality Date  . ESOPHAGOGASTRODUODENOSCOPY N/A 05/29/2014   Procedure: ESOPHAGOGASTRODUODENOSCOPY (EGD) ;  Surgeon: Beryle Beams, MD;  Location: [facility] ENDOSCOPY;  Service: Endoscopy;  Laterality: N/A;  check with Dr. Collene Mares about sedation type/timinng - I recommend MAC  . HAND SURGERY  2006   Family History: History reviewed. No pertinent family history. Family Psychiatric  History: see H&P Social History:  Social History   Substance and Sexual Activity  Alcohol Use Not Currently     Social History   Substance and Sexual Activity  Drug Use Yes  . Types: Marijuana, Cocaine, IV   Comment: OPIATES    Social History   Socioeconomic History  . Marital status: Single    Spouse name: Not on file  . Number of children: Not on file  . Years of education: Not on file  . Highest education level: Not on  file  Occupational History  . Not on file  Social Needs  . Financial resource strain: Not on file  . Food insecurity:    Worry: Not on file    Inability: Not on file  . Transportation needs:    Medical: Not on file    Non-medical: Not on file  Tobacco Use  . Smoking status: Current Every Day Smoker    Packs/day: 0.50    Types: Cigarettes  . Smokeless tobacco: Never Used   Substance and Sexual Activity  . Alcohol use: Not Currently  . Drug use: Yes    Types: Marijuana, Cocaine, IV    Comment: OPIATES  . Sexual activity: Not Currently    Birth control/protection: Condom  Lifestyle  . Physical activity:    Days per week: Not on file    Minutes per session: Not on file  . Stress: Not on file  Relationships  . Social connections:    Talks on phone: Not on file    Gets together: Not on file    Attends religious service: Not on file    Active member of club or organization: Not on file    Attends meetings of clubs or organizations: Not on file    Relationship status: Not on file  Other Topics Concern  . 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 famotidine (PEPCID) tablet 20 mg  20 mg Oral BID Rankin, Shuvon B, NP   20 mg at 03/30/18 0859  . FLUoxetine (PROZAC) capsule 20 mg  20 mg Oral Daily Lindell Spar I, NP   20 mg at 03/31/18 0816  . gabapentin (NEURONTIN) capsule 600 mg  600 mg Oral TID Rankin, Shuvon B, NP   600 mg at 03/31/18 1213  . hydrOXYzine (ATARAX/VISTARIL) tablet 25 mg  25 mg Oral Q6H PRN Patriciaann Clan E, PA-C   25 mg at 03/28/18 1813  . mirtazapine (REMERON) tablet 7.5 mg  7.5 mg Oral QHS Nwoko, Agnes I, NP   7.5 mg at 03/30/18 2102  . nicotine (NICODERM CQ - dosed in mg/24 hours) patch 21 mg  21 mg Transdermal Daily Rankin, Shuvon B, NP   21 mg at 03/26/18 0752  . OLANZapine (ZYPREXA) tablet 10 mg  10 mg Oral QHS Lindell Spar I, NP   10 mg at 03/30/18 2103  . traZODone (DESYREL) tablet 50 mg  50 mg Oral QHS PRN Lindell Spar I, NP   50 mg at 03/30/18 2102    Lab Results: No results found for this or any previous  visit (from the past 48 hour(s)).  Blood Alcohol level:  Lab Results  Component Value Date   ETH <10 03/25/2018   ETH <5 97/74/1423    Metabolic Disorder Labs: Lab Results  Component Value Date   HGBA1C 5.4 03/28/2018   MPG 108.28 03/28/2018   No results found for: PROLACTIN Lab Results  Component Value Date   CHOL 152 03/28/2018   TRIG 99 03/28/2018   HDL 44 03/28/2018   CHOLHDL 3.5 03/28/2018   VLDL 20 03/28/2018   LDLCALC  88 03/28/2018    Physical Findings: AIMS: Facial and Oral Movements Muscles of Facial Expression: None, normal Lips and Perioral Area: None, normal Jaw: None, normal Tongue: None, normal,Extremity Movements Upper (arms, wrists, hands, fingers): None, normal Lower (legs, knees, ankles, toes): None, normal, Trunk Movements Neck, shoulders, hips: None, normal, Overall Severity Severity of abnormal movements (highest score from questions above): None, normal Incapacitation due to abnormal movements: None, normal Patient's awareness of abnormal movements (rate only patient's report): No Awareness, Dental Status Current problems with teeth and/or dentures?: No Does patient usually wear dentures?: No  CIWA:  CIWA-Ar Total: 3 COWS:  COWS Total Score: 0  Musculoskeletal: Strength & Muscle Tone: within normal limits Gait & Station: normal Patient leans: N/A  Psychiatric Specialty Exam: Physical Exam  Nursing note and vitals reviewed.   Review of Systems  Constitutional: Negative for chills and fever.  Respiratory: Negative for cough and shortness of breath.   Cardiovascular: Negative for chest pain.  Gastrointestinal: Negative for abdominal pain, heartburn, nausea and vomiting.  Psychiatric/Behavioral: Positive for depression. Negative for hallucinations and suicidal ideas. The patient is nervous/anxious. The patient does not have insomnia.     Blood pressure 126/80, pulse 75, temperature 98.2 F (36.8 C), temperature source Oral, resp. rate 18,  height _0  (1.727 m), weight 74.8 kg (165 lb), SpO2 98 %.Body mass index is 25.09 kg/m.  General Appearance: Casual and Fairly Groomed  Eye Contact:  Good  Speech:  Clear and Coherent  Volume:  Normal  Mood:  Anxious and Depressed  Affect:  Appropriate, Congruent and Constricted  Thought Process:  Coherent and Goal Directed  Orientation:  Full (Time, Place, and Person)  Thought Content:  Logical  Suicidal Thoughts:  No  Homicidal Thoughts:  No  Memory:  Immediate;   Fair Recent;   Fair Remote;   Fair  Judgement:  Fair  Insight:  Lacking  Psychomotor Activity:  Normal  Concentration:  Concentration: Fair  Recall:  [employer] of Knowledge:  Fair  Language:  Fair  Akathisia:  No  Handed:    AIMS (if indicated):     Assets:  Armed forces logistics/support/administrative officer Physical Health Resilience Social Support  ADL's:  Intact  Cognition:  WNL  Sleep:  Number of Hours: 6.25   Treatment Plan Summary: Daily contact with patient to assess and evaluate symptoms and progress in treatment and Medication management   - Continue inpatient hospitalization.  -Bipolar II, current episode depressed, without psychotic features -Continue zyprexa 10 mg po qhs -Continue Remeron from 42m po Q hs to Remeron 7.5 mg po Q hs.             -Continue Prozac 20 mg po daily.  -Anxiety -Continue gabapentin 6028mpo TID -Continue atarax 2533mo q6h prn anxiety  -Insomnia -Continue trazodone 28m60m qhs prn insomnia  -Encourage participation in groups and therapeutic milieu  -Disposition planning will be ongoing    ChriPennelope Bracken 03/31/2018, 2:41 PM

## 2018-04-01 NOTE — Progress Notes (Signed)
Patient ID: Brian [name], male   DOB: Aug 22, 1986, 32 y.o.   MRN: [id number] Patient discharged to home/self care on his own accord. Patient was eager to discharge patient denies SI, HI and AVH upon discharge.  Patient acknowledged all discharge instructions and receipt of personal belongings.

## 2018-04-01 NOTE — Progress Notes (Signed)
Pt continues to isolate to his room most of the day.  He denies SI/HI/AVH at this time.  He has voiced no needs or concerns.  He is anxious about his court date on Friday, and discharge tomorrow to a shelter.  He is cooperative with staff, but does not attend groups.  Support and encouragement offered.  Discharge plans are in process.  Safety maintained with q15 minute checks.

## 2018-04-01 NOTE — [facility] Suicide Risk Assessment (Signed)
[facility] Discharge Suicide Risk Assessment   Principal Problem: Substance induced mood disorder [facility]) Discharge Diagnoses:  Patient Active Problem List   Diagnosis Date Noted  . Amphetamine use disorder, severe, dependence ([place]) [F15.20] 03/26/2018  . Substance induced mood disorder ([place]) [F19.94] 03/26/2018  . Bipolar II disorder, severe, depressed, with anxious distress ([place]) [F31.81] 03/25/2018  . Cellulitis [L03.90] 05/20/2015  . Dysuria [R30.0] 05/20/2015  . Smoker [F17.200] 05/20/2015  . Homeless single person [Z59.0] 05/20/2015  . Drug abuse ([place]) [F19.10]   . Opioid use disorder, severe, dependence ([place]) [F11.20] 03/08/2015  . Ileus of unspecified type ([place]) [K56.7] 05/28/2014  . Abdominal pain [R10.9] 05/28/2014  . AKI (acute kidney injury) ([place]) [N17.9] 05/28/2014  . Esophagitis, acute [K20.9] 05/28/2014  . Polysubstance abuse ([place]) [F19.10] 05/28/2014  . Hematemesis [K92.0] 05/28/2014    Total Time spent with patient: 30 minutes  Musculoskeletal: Strength & Muscle Tone: within normal limits Gait & Station: normal Patient leans: N/A  Psychiatric Specialty Exam: Review of Systems  Constitutional: Negative for chills and fever.  Respiratory: Negative for cough and shortness of breath.   Cardiovascular: Negative for chest pain.  Gastrointestinal: Negative for abdominal pain, heartburn, nausea and vomiting.  Psychiatric/Behavioral: Negative for depression, hallucinations and suicidal ideas. The patient is not nervous/anxious and does not have insomnia.     Blood pressure 121/68, pulse 72, temperature 97.9 F (36.6 C), temperature source Oral, resp. rate 16, height 5' 8" (1.727 m), weight 74.8 kg (165 lb), SpO2 98 %.Body mass index is 25.09 kg/m.  General Appearance: Casual and Fairly Groomed  Engineer, water::  Good  Speech:  Clear and Coherent and Normal Rate  Volume:  Normal  Mood:  Euthymic  Affect:  Appropriate and Congruent  Thought Process:  Coherent and Goal  Directed  Orientation:  Full (Time, Place, and Person)  Thought Content:  Logical  Suicidal Thoughts:  No  Homicidal Thoughts:  No  Memory:  Immediate;   Fair Recent;   Fair Remote;   Fair  Judgement:  Fair  Insight:  Fair  Psychomotor Activity:  Normal  Concentration:  Fair  Recall:  [employer] of Knowledge:Fair  Language: Fair  Akathisia:  No  Handed:    AIMS (if indicated):     Assets:  Resilience Social Support  Sleep:  Number of Hours: 6.75  Cognition: WNL  ADL's:  Intact   Mental Status Per Nursing Assessment::   On Admission:  Suicidal ideation indicated by patient, Suicide plan, Self-harm thoughts  Demographic Factors:  Male, Caucasian and Low socioeconomic status  Loss Factors: NA  Historical Factors: Impulsivity  Risk Reduction Factors:   Positive social support, Positive therapeutic relationship and Positive coping skills or problem solving skills  Continued Clinical Symptoms:  Severe Anxiety and/or Agitation Depression:   Comorbid alcohol abuse/dependence Severe Alcohol/Substance Abuse/Dependencies  Cognitive Features That Contribute To Risk:  None    Suicide Risk:  Minimal: No identifiable suicidal ideation.  Patients presenting with no risk factors but with morbid ruminations; may be classified as minimal risk based on the severity of the depressive symptoms  Follow-up Information    Services, [facility] Follow up on 04/08/2018.   Why:  Screening for possible admission on Thursday, 6/13 at 8:00AM. Please bring: photo ID/proof of [employer] residency, 30 day supply of all medications, and clothing. Thank you.  Contact information: [place] [phone]        [facility] Follow up.  Why:  Walk in within 3 days of hospital discharge to be assessed for medication management, maintenance program, and therapy/groups. Walk in hours: Monday, Wednesday, Friday 11am-12:30pm. Thank you.   Contact information: [address], [place] [address] Phone: [phone] Fax: [phone]         Subjective Data:  Brian [name] is a 31 y/o M with history of Bipolar II and polysubstance abuse who [facility] with worsening depression, SI with plan to overdose on heroin and recent attempt, and recent relapse on multiple substances including amphetamines, opiates, and cocaine. Pt was medically cleared and then transferred to [facility] for additional treatment.He was started on regimen of zyprexa, remeron, and eventually prozac was added. Wellbutrin was discontinued.  He reported incremental improvement of his presenting symptoms during his stay. He met with SW team about referral to substance use treatments.  Today upon evaluation, pt shares, "It's going okay." He denies any specific concerns today aside from some anxiety about his upcoming court date. He is sleeping well. His appetite is good. He denies physical complaints. He denies SI/HI/AH/VH. He reports that his medications have been helpful and he is in agreement to continue his current regimen without changes. He has an intake screening at [facility] on 6/13 which he plans to attend. He will receive outpatient services with [facility]. He plans to stay in the [place] area in a shelter. He was able to engage in safety planning including plan to return to [facility] or contact emergency services if he feels unable to maintain his own safety or the safety of others. Pt had no further questions, comments, or concerns.   Plan Of Care/Follow-up recommendations:   - Discharge to outpatient level of care  -Bipolar II, current episode depressed, without psychotic features -Continue zyprexa 10 mg po qhs -Continue Remeron 7.5 mg po Q hs. -ContinueProzac 20 mg po daily.  -Anxiety -Continue gabapentin 600mg po TID -Continue atarax 25mg po  q6h prn anxiety  -Insomnia -Continue trazodone 50mg po qhs prn insomnia  Activity:  as tolerated Diet:  normal Tests:  NA Other:  see above for DC plan   T , MD 04/01/2018, 9:48 AM 

## 2018-04-01 NOTE — Progress Notes (Signed)
Patient ID: Brian [name], male   DOB: [birth year], 32 y.o.   MRN: [id number]  Nursing Progress Note [id number]  Data: Patient presents with flat affect and depressed mood. Patient complaint with scheduled medications. Patient denies pain/physical complaints. Patient is minimal with Probation officer and forwards little when prompted. Patient appears disengaged from participating in the milieu. Patient completed self-inventory sheet and rates depression, hopelessness, and anxiety 8,7,8 respectively. Patient rates their sleep and appetite as fair/good respectively. Patient states goal for today is to "go wherever". Patient declines concerns at this time. Patient currently denies SI/HI/AVH.   Action: Patient educated about and provided medication per provider's orders. Patient safety maintained with q15 min safety checks and frequent rounding. Low fall risk precautions in place. Emotional support given. 1:1 interaction and active listening provided. Patient encouraged to attend meals and groups. Patient encouraged to work on treatment plan and goals. Labs, vital signs and patient behavior monitored throughout shift.   Response: Patient agrees to come to staff if any thoughts of SI/HI develop or if patient develops intention of acting on thoughts. Patient remains safe on the unit at this time. Patient is interacting with peers appropriately on the unit. Will continue to support and monitor.

## 2018-04-30 ENCOUNTER — Emergency Department (HOSPITAL_BASED_OUTPATIENT_CLINIC_OR_DEPARTMENT_OTHER)
Admission: EM | Admit: 2018-04-30 | Discharge: 2018-04-30 | Disposition: A | Discharge status: Home / Self Care | Payer: Self-pay | Attending: Emergency Medicine | Admitting: Emergency Medicine

## 2018-04-30 ENCOUNTER — Other Ambulatory Visit: Payer: Self-pay

## 2018-04-30 ENCOUNTER — Encounter (HOSPITAL_BASED_OUTPATIENT_CLINIC_OR_DEPARTMENT_OTHER): Payer: Self-pay

## 2018-04-30 DIAGNOSIS — Z79899 Other long term (current) drug therapy: Secondary | ICD-10-CM | POA: Insufficient documentation

## 2018-04-30 DIAGNOSIS — F17210 Nicotine dependence, cigarettes, uncomplicated: Secondary | ICD-10-CM | POA: Insufficient documentation

## 2018-04-30 DIAGNOSIS — Z590 Homelessness: Secondary | ICD-10-CM | POA: Insufficient documentation

## 2018-04-30 DIAGNOSIS — F1410 Cocaine abuse, uncomplicated: Secondary | ICD-10-CM | POA: Insufficient documentation

## 2018-04-30 DIAGNOSIS — F1010 Alcohol abuse, uncomplicated: Secondary | ICD-10-CM | POA: Insufficient documentation

## 2018-04-30 DIAGNOSIS — F419 Anxiety disorder, unspecified: Secondary | ICD-10-CM | POA: Insufficient documentation

## 2018-04-30 DIAGNOSIS — F1120 Opioid dependence, uncomplicated: Secondary | ICD-10-CM | POA: Insufficient documentation

## 2018-04-30 DIAGNOSIS — F3181 Bipolar II disorder: Secondary | ICD-10-CM | POA: Insufficient documentation

## 2018-04-30 DIAGNOSIS — N4889 Other specified disorders of penis: Secondary | ICD-10-CM | POA: Insufficient documentation

## 2018-04-30 LAB — URINALYSIS, ROUTINE W REFLEX MICROSCOPIC
BILIRUBIN URINE: NEGATIVE
Glucose, UA: NEGATIVE mg/dL
HGB URINE DIPSTICK: NEGATIVE
Ketones, ur: NEGATIVE mg/dL
LEUKOCYTES UA: NEGATIVE
NITRITE: NEGATIVE
PROTEIN: NEGATIVE mg/dL
pH: 6 (ref 5.0–8.0)

## 2018-04-30 MED ORDER — NAPROXEN 375 MG PO TABS: 375.0000 mg | ORAL_TABLET | Freq: Two times a day (BID) | ORAL | 0 refills | Status: DC

## 2018-04-30 MED ORDER — TAMSULOSIN HCL 0.4 MG PO CAPS: 0.4000 mg | ORAL_CAPSULE | Freq: Every day | ORAL | 0 refills | Status: AC

## 2018-04-30 NOTE — ED Notes (Signed)
NAD at this time. Pt is stable and going home.  

## 2018-04-30 NOTE — ED Provider Notes (Signed)
[facility] EMERGENCY DEPARTMENT Provider Note   CSN: [id number] Arrival date & time: 04/30/18  1338     History   Chief Complaint Chief Complaint  Patient presents with  . Hematuria    HPI Brian [name] is a 32 y.o. male.  HPI  Patient is a 32 year old male with a history of nephrolithiasis, narcotic abuse, methamphetamine use, anxiety, and depression presenting for a small amount of penile bleeding.  Patient is currently treated for substance use [facility].  Patient reports that 2 days ago, he was masturbating, when after masturbation he noticed that there was a small amount of blood in his underwear.  Patient noticed no further active bleeding, nor any blood mixed in with the semen.  Patient denies noting any hematuria.  Patient reports that over the same interval, he has had some left flank pain, mild in nature, and intermittent.  Patient also noting some irritation with urination, but no sharp pain with urination.  Patient does not feel that this is similar to his prior nephrolithiasis.  Patient denies any sexual partners in the past 5 months.  Patient has any penile discharge, rectal pain or pain with bowel movements, nausea or vomiting, or suprapubic pain.  Patient is taking ibuprofen with some mild relief of symptoms.  Past Medical History:  Diagnosis Date  . Alcohol abuse   . Anxiety   . Cocaine abuse ([place])   . Depression   . Kidney stones   . Narcotic abuse ([place])   . Peptic ulcer   . Polysubstance abuse ([place])   . Renal disorder    kidney stones    Patient Active Problem List   Diagnosis Date Noted  . Amphetamine use disorder, severe, dependence ([employer]) 03/26/2018  . Substance induced mood disorder ([place]) 03/26/2018  . Bipolar II disorder, severe, depressed, with anxious distress ([place]) 03/25/2018  . Cellulitis 05/20/2015  . Dysuria 05/20/2015  . Smoker 05/20/2015  . Homeless single person 05/20/2015  . Drug abuse ([place])   . Opioid  use disorder, severe, dependence ([place]) 03/08/2015  . Ileus of unspecified type ([place]) 05/28/2014  . Abdominal pain 05/28/2014  . AKI (acute kidney injury) ([place]) 05/28/2014  . Esophagitis, acute 05/28/2014  . Polysubstance abuse ([place]) 05/28/2014  . Hematemesis 05/28/2014    Past Surgical History:  Procedure Laterality Date  . ESOPHAGOGASTRODUODENOSCOPY N/A 05/29/2014   Procedure: ESOPHAGOGASTRODUODENOSCOPY (EGD) ;  Surgeon: Beryle Beams, MD;  Location: [facility] ENDOSCOPY;  Service: Endoscopy;  Laterality: N/A;  check with Dr. Collene Mares about sedation type/timinng - I recommend MAC  . HAND SURGERY  2006        Home Medications    Prior to Admission medications   Medication Sig Start Date End Date Taking? Authorizing Provider  famotidine (PEPCID) 20 MG tablet Take 1 tablet (20 mg total) by mouth 2 (two) times daily. For acid reflux 03/31/18   Lindell Spar I, NP  FLUoxetine (PROZAC) 20 MG capsule Take 1 capsule (20 mg total) by mouth daily. For depression 04/01/18   Lindell Spar I, NP  gabapentin (NEURONTIN) 300 MG capsule Take 2 capsules (600 mg total) by mouth 3 (three) times daily. For agitation 03/31/18   Lindell Spar I, NP  hydrOXYzine (ATARAX/VISTARIL) 25 MG tablet Take 1 tablet (25 mg total) by mouth every 6 (six) hours as needed for anxiety. 03/31/18   Lindell Spar I, NP  mirtazapine (REMERON) 7.5 MG tablet Take 1 tablet (7.5 mg total) by mouth at bedtime. For  sleep 03/31/18   Lindell Spar I, NP  naproxen (NAPROSYN) 375 MG tablet Take 1 tablet (375 mg total) by mouth 2 (two) times daily. 04/30/18   Langston Masker B, PA-C  nicotine (NICODERM CQ - DOSED IN MG/24 HOURS) 21 mg/24hr patch Place 1 patch (21 mg total) onto the skin daily. (May purchase from over the counter at the pharmacy): For smoking cessation 04/01/18   Lindell Spar I, NP  OLANZapine (ZYPREXA) 10 MG tablet Take 1 tablet (10 mg total) by mouth at bedtime. For mood control 03/31/18   Lindell Spar I, NP  tamsulosin (FLOMAX) 0.4 MG CAPS capsule Take  1 capsule (0.4 mg total) by mouth daily for 5 days. 04/30/18 05/05/18  Langston Masker B, PA-C  traZODone (DESYREL) 50 MG tablet Take 1 tablet (50 mg total) by mouth at bedtime as needed for sleep. 03/31/18   Encarnacion Slates, NP    Family History No family history on file.  Social History Social History   Tobacco Use  . Smoking status: Current Every Day Smoker    Packs/day: 0.50    Types: Cigarettes  . Smokeless tobacco: Never Used  Substance Use Topics  . Alcohol use: Not Currently  . Drug use: Not Currently    Types: Marijuana, Cocaine, IV    Comment: in rehab     Allergies   Fish allergy   Review of Systems Review of Systems  Constitutional: Negative for chills and fever.  Gastrointestinal: Negative for abdominal pain, nausea and vomiting.  Genitourinary: Positive for dysuria and flank pain. Negative for discharge, hematuria, penile pain, penile swelling, scrotal swelling and testicular pain.       + Penile bleeding     Physical Exam Updated Vital Signs BP (!) 138/91 (BP Location: Right Arm)   Pulse 88   Temp 98.6 F (37 C) (Oral)   Resp 16   Ht _0  (1.727 m)   Wt 79.4 kg (175 lb)   SpO2 100%   BMI 26.61 kg/m   Physical Exam  Constitutional: He appears well-developed and well-nourished. No distress.  Sitting comfortably in bed.  HENT:  Head: Normocephalic and atraumatic.  Eyes: Conjunctivae are normal. Right eye exhibits no discharge. Left eye exhibits no discharge.  EOMs normal to gross examination.  Neck: Normal range of motion.  Cardiovascular: Normal rate and regular rhythm.  Intact, 2+ radial pulse.  Pulmonary/Chest:  Normal respiratory effort. Patient converses comfortably. No audible wheeze or stridor.  Abdominal: Soft. He exhibits no distension. There is no tenderness. There is no guarding.  Mild CVA tenderness, but most discomfort centered to lateral flank on palpation.   Genitourinary:  Genitourinary Comments: Examination performed with nurse  tech chaperone present.  No external lesions of the testicles or penis.  There is no active penile discharge or active penile bleeding.  No erythema of the penis or testes.  No tenderness to palpation of penis, epididymis, or testes.  Musculoskeletal: Normal range of motion.  Neurological: He is alert.  Cranial nerves intact to gross observation. Patient moves extremities without difficulty.  Skin: Skin is warm and dry. He is not diaphoretic.  Psychiatric: He has a normal mood and affect. His behavior is normal. Judgment and thought content normal.  Nursing note and vitals reviewed.    ED Treatments / Results  Labs (all labs ordered are listed, but only abnormal results are displayed) Labs Reviewed  URINALYSIS, ROUTINE W REFLEX MICROSCOPIC - Abnormal; Notable for the following components:  Result Value   Specific Gravity, Urine <1.005 (*)    All other components within normal limits  GC/CHLAMYDIA PROBE AMP ([place]) NOT AT [facility]    EKG None  Radiology No results found.  Procedures Procedures (including critical care time)  Medications Ordered in ED Medications - No data to display   Initial Impression / Assessment and Plan / ED Course  I have reviewed the triage vital signs and the nursing notes.  Pertinent labs & imaging results that were available during my care of the patient were reviewed by me and considered in my medical decision making (see chart for details).     Patient nontoxic-appearing, afebrile, with benign abdomen.  Examination reveals no abnormality of the penis or testicles.  Urinalysis is clear of any hematuria, or infection.  Differential diagnosis for patient's symptoms include trauma to urethral meatus secondary to manipulation, nephrolithiasis, STI.  GC chlamydia added onto urinalysis, patient started that he will receive a call for positive results.  Given patient's history of recurrent nephrolithiasis, will treat patient symptomatically in the  event that he is passing a small stone fragment.  Feel that this is unlikely, but discussed with patient to 5 days of Flomax and naproxen.  Return precautions were given for any worsening bleeding, penile irritation or drainage, lower abdominal pain, rectal pain, abdominal pain, or terrible nausea vomiting.  Patient is in understanding and agrees with the plan of care.  Patient to follow-up with PCP and/or his previous urologist.  Final Clinical Impressions(s) / ED Diagnoses   Final diagnoses:  Penile bleeding    ED Discharge Orders        Ordered    tamsulosin (FLOMAX) 0.4 MG CAPS capsule  Daily     04/30/18 1526    naproxen (NAPROSYN) 375 MG tablet  2 times daily     04/30/18 [address] PA-C 04/30/18 1552    Blanchie Dessert, MD 05/04/18 1340

## 2018-04-30 NOTE — Discharge Instructions (Signed)
Please see the information and instructions below regarding your visit.  Your diagnoses today include:  1. Penile bleeding    Your examination is very reassuring today.  Occasionally with trauma, the meatus which is the opening of the head of the penis into the urinary system can become irritated and they can be a small amount of bleeding.  Fortunately your examination and testing looks normal today.  Tests performed today include: See side panel of your discharge paperwork for testing performed today. Vital signs are listed at the bottom of these instructions.   Urinalysis demonstrates no evidence of blood in your urine today.  Gonorrhea and Chlamydia testing was added onto the urine.  If anything is positive, you will receive a call within 48 to 72 hours.  No call means that the tests are negative.  Medications prescribed:    Take any prescribed medications only as prescribed, and any over the counter medications only as directed on the packaging.  Please take Flomax for 5 days.  This is in the event that you are passing a small kidney stone.  This will make you urinate more.  You are prescribed Naproxen, a non-steroidal anti-inflammatory agent (NSAID) for pain. You may take 375mg  every 12 hours as needed for pain. If still requiring this medication around the clock for acute pain after 10 days, please see your primary healthcare provider.  Women who are pregnant, breastfeeding, or planning on becoming pregnant should not take non-steroidal anti-inflammatories such as Advil and Aleve. Tylenol is a safe over the counter pain reliever in pregnant women.  You may combine this medication with Tylenol, 650 mg every 6 hours, so you are receiving something for pain every 3 hours.  This is not a long-term medication unless under the care and direction of your primary provider. Taking this medication long-term and not under the supervision of a healthcare provider could increase the risk of  stomach ulcers, kidney problems, and cardiovascular problems such as high blood pressure.    Home care instructions:  Please follow any educational materials contained in this packet.   Follow-up instructions: Please follow-up with your primary care provider in 2-4 weeks for further evaluation of your symptoms if they are not completely improved.   Return instructions:  Please return to the Emergency Department if you experience worsening symptoms.  Please return the emergency department for any worsening flank pain, continuous bloody discharge from the penis, redness to the penis or testicles. Please return if you have any other emergent concerns.  Additional Information:   Your vital signs today were: BP (!) 138/91 (BP Location: Right Arm)    Pulse 88    Temp 98.6 F (37 C) (Oral)    Resp 16    Ht 5\' 8"  (1.727 m)    Wt 79.4 kg (175 lb)    SpO2 100%    BMI 26.61 kg/m  If your blood pressure (BP) was elevated on multiple readings during this visit above 130 for the top number or above 80 for the bottom number, please have this repeated by your primary care provider within one month. --------------  Thank you for allowing [place] to participate in your care today.

## 2018-04-30 NOTE — ED Triage Notes (Addendum)
Pt c/o blood in urine and semen after masturbation yesterday-NAD-steady gait-in rehab/[facility] x 2 weeks

## 2018-04-30 NOTE — ED Notes (Signed)
ED Provider at bedside. 

## 2018-05-03 LAB — GC/CHLAMYDIA PROBE AMP (~~LOC~~) NOT AT ARMC
CHLAMYDIA, DNA PROBE: NEGATIVE
NEISSERIA GONORRHEA: NEGATIVE

## 2019-08-30 ENCOUNTER — Emergency Department (HOSPITAL_COMMUNITY): Admission: EM | Admit: 2019-08-30 | Discharge: 2019-08-30 | Discharge status: Absent without leave | Payer: Self-pay

## 2019-08-30 ENCOUNTER — Other Ambulatory Visit: Payer: Self-pay

## 2019-08-30 ENCOUNTER — Encounter (HOSPITAL_COMMUNITY): Payer: Self-pay | Admitting: Emergency Medicine

## 2019-08-30 ENCOUNTER — Emergency Department (HOSPITAL_COMMUNITY)
Admission: EM | Admit: 2019-08-30 | Discharge: 2019-08-30 | Disposition: A | Discharge status: Home / Self Care | Payer: Self-pay | Attending: Emergency Medicine | Admitting: Emergency Medicine

## 2019-08-30 DIAGNOSIS — E860 Dehydration: Secondary | ICD-10-CM | POA: Insufficient documentation

## 2019-08-30 DIAGNOSIS — R112 Nausea with vomiting, unspecified: Secondary | ICD-10-CM | POA: Insufficient documentation

## 2019-08-30 DIAGNOSIS — Z791 Long term (current) use of non-steroidal anti-inflammatories (NSAID): Secondary | ICD-10-CM | POA: Insufficient documentation

## 2019-08-30 DIAGNOSIS — F1910 Other psychoactive substance abuse, uncomplicated: Secondary | ICD-10-CM | POA: Insufficient documentation

## 2019-08-30 DIAGNOSIS — Z79899 Other long term (current) drug therapy: Secondary | ICD-10-CM | POA: Insufficient documentation

## 2019-08-30 DIAGNOSIS — F17210 Nicotine dependence, cigarettes, uncomplicated: Secondary | ICD-10-CM | POA: Insufficient documentation

## 2019-08-30 LAB — RAPID URINE DRUG SCREEN, HOSP PERFORMED
Amphetamines: POSITIVE — AB
Barbiturates: NOT DETECTED
Benzodiazepines: NOT DETECTED
Cocaine: NOT DETECTED
Opiates: NOT DETECTED
Tetrahydrocannabinol: POSITIVE — AB

## 2019-08-30 LAB — CBC
HCT: 49.5 % (ref 39.0–52.0)
Hemoglobin: 16 g/dL (ref 13.0–17.0)
MCH: 28 pg (ref 26.0–34.0)
MCHC: 32.3 g/dL (ref 30.0–36.0)
MCV: 86.5 fL (ref 80.0–100.0)
Platelets: 277 10*3/uL (ref 150–400)
RBC: 5.72 MIL/uL (ref 4.22–5.81)
RDW: 14.8 % (ref 11.5–15.5)
WBC: 9.4 10*3/uL (ref 4.0–10.5)
nRBC: 0 % (ref 0.0–0.2)

## 2019-08-30 LAB — URINALYSIS, ROUTINE W REFLEX MICROSCOPIC
Bacteria, UA: NONE SEEN
Bilirubin Urine: NEGATIVE
Glucose, UA: NEGATIVE mg/dL
Ketones, ur: 80 mg/dL — AB
Leukocytes,Ua: NEGATIVE
Nitrite: NEGATIVE
Protein, ur: 30 mg/dL — AB
Specific Gravity, Urine: 1.024 (ref 1.005–1.030)
pH: 5 (ref 5.0–8.0)

## 2019-08-30 LAB — COMPREHENSIVE METABOLIC PANEL
ALT: 52 U/L — ABNORMAL HIGH (ref 0–44)
AST: 50 U/L — ABNORMAL HIGH (ref 15–41)
Albumin: 4.8 g/dL (ref 3.5–5.0)
Alkaline Phosphatase: 94 U/L (ref 38–126)
Anion gap: 13 (ref 5–15)
BUN: 20 mg/dL (ref 6–20)
CO2: 24 mmol/L (ref 22–32)
Calcium: 9.7 mg/dL (ref 8.9–10.3)
Chloride: 100 mmol/L (ref 98–111)
Creatinine, Ser: 1.35 mg/dL — ABNORMAL HIGH (ref 0.61–1.24)
GFR calc Af Amer: >60 mL/min (ref >60)
GFR calc non Af Amer: >60 mL/min (ref >60)
Glucose, Bld: 113 mg/dL — ABNORMAL HIGH (ref 70–99)
Potassium: 4.3 mmol/L (ref 3.5–5.1)
Sodium: 137 mmol/L (ref 135–145)
Total Bilirubin: 1.6 mg/dL — ABNORMAL HIGH (ref 0.3–1.2)
Total Protein: 9.3 g/dL — ABNORMAL HIGH (ref 6.5–8.1)

## 2019-08-30 LAB — LIPASE, BLOOD: Lipase: 29 U/L (ref 11–51)

## 2019-08-30 MED ORDER — SUCRALFATE 1 G PO TABS: 1.0000 g | ORAL_TABLET | Freq: Three times a day (TID) | ORAL | 0 refills | Status: DC

## 2019-08-30 MED ORDER — ONDANSETRON HCL 4 MG/2ML IJ SOLN
4.0000 mg | Freq: Once | INTRAMUSCULAR | Status: AC
  Administered 2019-08-30: 12:00:00 4 mg via INTRAVENOUS
  Filled 2019-08-30: qty 2

## 2019-08-30 MED ORDER — FAMOTIDINE 20 MG PO TABS: 20.0000 mg | ORAL_TABLET | Freq: Two times a day (BID) | ORAL | 0 refills | Status: DC

## 2019-08-30 MED ORDER — ONDANSETRON 4 MG PO TBDP: 4.0000 mg | ORAL_TABLET | Freq: Three times a day (TID) | ORAL | 0 refills | Status: DC | PRN

## 2019-08-30 MED ORDER — SODIUM CHLORIDE 0.9 % IV BOLUS
1000.0000 mL | Freq: Once | INTRAVENOUS | Status: AC
  Administered 2019-08-30: 1000 mL via INTRAVENOUS

## 2019-08-30 NOTE — ED Triage Notes (Signed)
Pt c/o for past week waking up with abd pains, vomiting and dysuria. Reports pains get better in afternoon but started again when goes to bed. Reports knots on left breast x 3-4 days. Is painful with palpation.

## 2019-08-30 NOTE — Discharge Instructions (Signed)
Please read and follow all provided instructions.  Your diagnoses today include:  1. Non-intractable vomiting with nausea, unspecified vomiting type   2. Dehydration   3. Substance abuse ([place])     Tests performed today include:  Blood counts and electrolytes  Blood tests to check liver and kidney function  Blood tests to check pancreas function  Urine test to look for infection - shows dehydration  Vital signs. See below for your results today.   Medications prescribed:   Pepcid (famotidine) - antihistamine  You can find this medication over-the-counter.   DO NOT exceed:   20mg  Pepcid every 12 hours   Zofran (ondansetron) - for nausea and vomiting   Carafate - for stomach upset and to protect your stomach  Take any prescribed medications only as directed.  Home care instructions:   Follow any educational materials contained in this packet.   You should rest for the next several days. Keep drinking plenty of fluids and use the medicine for nausea as directed.    Drink clear liquids for the next 24 hours and introduce solid foods slowly after 24 hours using the b.r.a.t. diet (Bananas, Rice, Applesauce, Toast, Yogurt).    Follow-up instructions: Please follow-up with your primary care provider in the next 3 days for further evaluation of your symptoms. If you are not feeling better in 48 hours you may have a condition that is more serious and you need re-evaluation.   Return instructions:  SEEK IMMEDIATE MEDICAL ATTENTION IF:  If you have pain that does not go away or becomes severe   A temperature above 101F develops   Repeated vomiting occurs (multiple episodes)   If you have pain that becomes localized to portions of the abdomen. The right side could possibly be appendicitis. In an adult, the left lower portion of the abdomen could be colitis or diverticulitis.   Blood is being passed in stools or vomit (bright red or black tarry stools)   You develop  chest pain, difficulty breathing, dizziness or fainting, or become confused, poorly responsive, or inconsolable (young children)  If you have any other emergent concerns regarding your health  Additional Information: Abdominal (belly) pain can be caused by many things. Your caregiver performed an examination and possibly ordered blood/urine tests and imaging (CT scan, x-rays, ultrasound). Many cases can be observed and treated at home after initial evaluation in the emergency department. Even though you are being discharged home, abdominal pain can be unpredictable. Therefore, you need a repeated exam if your pain does not resolve, returns, or worsens. Most patients with abdominal pain don't have to be admitted to the hospital or have surgery, but serious problems like appendicitis and gallbladder attacks can start out as nonspecific pain. Many abdominal conditions cannot be diagnosed in one visit, so follow-up evaluations are very important.  Your vital signs today were: BP (!) 152/132    Pulse 64    Temp 98.3 F (36.8 C) (Oral)    Resp 16    SpO2 97%  If your blood pressure (bp) was elevated above 135/85 this visit, please have this repeated by your doctor within one month. --------------

## 2019-08-30 NOTE — Patient Outreach (Signed)
ED Peer Support Specialist Patient Intake (Complete at intake & 30-60 Day Follow-up)  Name: Brian [name]  MRN: [id number]  Age: 33 y.o.   Date of Admission: 08/30/2019  Intake: Initial Comments:      Primary Reason Admitted: Substance Abuse  Lab values: Alcohol/ETOH: Negative Positive UDS? Yes Amphetamines: Yes Barbiturates: No Benzodiazepines: No Cocaine: No Opiates: No Cannabinoids: Yes  Demographic information: Gender: Male Ethnicity: White Marital Status: Single Insurance Status: Uninsured/Self-pay Ecologist (Work Neurosurgeon, Physicist, medical, etc.: No Lives with: Friend/Rommate Living situation: House/Apartment  Reported Patient History: Patient reported health conditions: None Patient aware of HIV and hepatitis status: Yes (comment)(Hep C)  In past year, has patient visited ED for any reason? No  Number of ED visits:    Reason(s) for visit:    In past year, has patient been hospitalized for any reason? No  Number of hospitalizations:    Reason(s) for hospitalization:    In past year, has patient been arrested? No  Number of arrests:    Reason(s) for arrest:    In past year, has patient been incarcerated? No  Number of incarcerations:    Reason(s) for incarceration:    In past year, has patient received medication-assisted treatment? Yes, Other (comment)  In past year, patient received the following treatments: Residential treatment (non-hospital)  In past year, has patient received any harm reduction services? No  Did this include any of the following?    In past year, has patient received care from a mental health provider for diagnosis other than SUD? No  In past year, is this first time patient has overdosed? No  Number of past overdoses:    In past year, is this first time patient has been hospitalized for an overdose? No  Number of hospitalizations for overdose(s):    Is patient currently receiving  treatment for a mental health diagnosis? No  Patient reports experiencing difficulty participating in SUD treatment: No    Most important reason(s) for this difficulty?    Has patient received prior services for treatment? No  In past, patient has received services from following agencies:    Plan of Care:  Suggested follow up at these agencies/treatment centers:    Other information: CPSS met with Pt an was able to use Motivational interviewing as well as able to gain information to better assist Pt. CPSS was made aware that Pt has utilized every source of services that can be offered he stated. CPSS asked Pt what can CPSS assist Pt with seeking at the time. Pt stated that he wants contact information for Opioid assistance services.  CPSS gave him contact information for [facility] a syringe exchange program that Pt can benefit from contacting them. CPSS left contact information for community services assistance.     Aaron Edelman Vonnetta Akey, CPSS  08/30/2019 3:34 PM

## 2019-08-30 NOTE — ED Provider Notes (Signed)
[facility] DEPT Provider Note   CSN: [id number] Arrival date & time: 08/30/19  1100     History   Chief Complaint Chief Complaint  Patient presents with  . Abdominal Pain  . Emesis  . Dysuria    HPI Brian [name] is a 33 y.o. male.     Patient with history of appendectomy, polysubstance abuse, peptic ulcer disease --presents to the emergency department with approximately 1 to 2 weeks of right-sided abdominal pain with associated vomiting.  Patient states that the pain changes over the course of the day but is generally better in the afternoon and worse at night.  No fevers or chills.  States that he has not had any alcohol in several weeks.  He has pain in his back and has a physical job and takes ibuprofen fairly regularly.  States he has been constipated somewhat but has not noted any blood in the stool.  No diarrhea.  States that he has had some burning with urination and dark-colored urine.  He relapsed recently and went into detox for alcohol, marijuana, methamphetamine.     Past Medical History:  Diagnosis Date  . Alcohol abuse   . Anxiety   . Cocaine abuse ([place])   . Depression   . Kidney stones   . Narcotic abuse ([place])   . Peptic ulcer   . Polysubstance abuse ([place])   . Renal disorder    kidney stones    Patient Active Problem List   Diagnosis Date Noted  . Amphetamine use disorder, severe, dependence ([place]) 03/26/2018  . Substance induced mood disorder ([place]) 03/26/2018  . Bipolar II disorder, severe, depressed, with anxious distress ([place]) 03/25/2018  . Cellulitis 05/20/2015  . Dysuria 05/20/2015  . Smoker 05/20/2015  . Homeless single person 05/20/2015  . Drug abuse ([place])   . Opioid use disorder, severe, dependence ([place]) 03/08/2015  . Ileus of unspecified type ([place]) 05/28/2014  . Abdominal pain 05/28/2014  . AKI (acute kidney injury) ([place]) 05/28/2014  . Esophagitis, acute 05/28/2014  . Polysubstance abuse ([place])  05/28/2014  . Hematemesis 05/28/2014    Past Surgical History:  Procedure Laterality Date  . ESOPHAGOGASTRODUODENOSCOPY N/A 05/29/2014   Procedure: ESOPHAGOGASTRODUODENOSCOPY (EGD) ;  Surgeon: Beryle Beams, MD;  Location: [facility]COPY;  Service: Endoscopy;  Laterality: N/A;  check with Dr. Collene Mares about sedation type/timinng - I recommend MAC  . HAND SURGERY  2006        Home Medications    Prior to Admission medications   Medication Sig Start Date End Date Taking? Authorizing Provider  famotidine (PEPCID) 20 MG tablet Take 1 tablet (20 mg total) by mouth 2 (two) times daily. For acid reflux 03/31/18   Lindell Spar I, NP  FLUoxetine (PROZAC) 20 MG capsule Take 1 capsule (20 mg total) by mouth daily. For depression 04/01/18   Lindell Spar I, NP  gabapentin (NEURONTIN) 300 MG capsule Take 2 capsules (600 mg total) by mouth 3 (three) times daily. For agitation 03/31/18   Lindell Spar I, NP  hydrOXYzine (ATARAX/VISTARIL) 25 MG tablet Take 1 tablet (25 mg total) by mouth every 6 (six) hours as needed for anxiety. 03/31/18   Lindell Spar I, NP  mirtazapine (REMERON) 7.5 MG tablet Take 1 tablet (7.5 mg total) by mouth at bedtime. For sleep 03/31/18   Lindell Spar I, NP  naproxen (NAPROSYN) 375 MG tablet Take 1 tablet (375 mg total) by mouth 2 (two) times daily. 04/30/18   Albesa Seen, PA-C  nicotine (NICODERM CQ - DOSED IN MG/24 HOURS) 21 mg/24hr patch Place 1 patch (21 mg total) onto the skin daily. (May purchase from over the counter at the pharmacy): For smoking cessation 04/01/18   Lindell Spar I, NP  OLANZapine (ZYPREXA) 10 MG tablet Take 1 tablet (10 mg total) by mouth at bedtime. For mood control 03/31/18   Lindell Spar I, NP  traZODone (DESYREL) 50 MG tablet Take 1 tablet (50 mg total) by mouth at bedtime as needed for sleep. 03/31/18   Encarnacion Slates, NP    Family History No family history on file.  Social History Social History   Tobacco Use  . Smoking status: Current Every Day Smoker     Packs/day: 0.50    Types: Cigarettes  . Smokeless tobacco: Never Used  Substance Use Topics  . Alcohol use: Not Currently  . Drug use: Not Currently    Types: Marijuana, Cocaine, IV    Comment: in rehab     Allergies   Fish allergy   Review of Systems Review of Systems  Constitutional: Negative for fever.  HENT: Negative for rhinorrhea and sore throat.   Eyes: Negative for redness.  Respiratory: Negative for cough.   Cardiovascular: Negative for chest pain.  Gastrointestinal: Positive for abdominal pain, nausea and vomiting. Negative for diarrhea.  Genitourinary: Positive for dysuria. Negative for hematuria.  Musculoskeletal: Negative for myalgias.  Skin: Negative for rash.  Neurological: Negative for headaches.     Physical Exam Updated Vital Signs BP (!) 128/103   Pulse (!) 120   Temp 98.3 F (36.8 C) (Oral)   Resp 17   SpO2 100%   Physical Exam Vitals signs and nursing note reviewed.  Constitutional:      Appearance: He is well-developed.  HENT:     Head: Normocephalic and atraumatic.  Eyes:     General:        Right eye: No discharge.        Left eye: No discharge.     Conjunctiva/sclera: Conjunctivae normal.  Neck:     Musculoskeletal: Normal range of motion and neck supple.  Cardiovascular:     Rate and Rhythm: Regular rhythm. Tachycardia present.     Heart sounds: Normal heart sounds.  Pulmonary:     Effort: Pulmonary effort is normal.     Breath sounds: Normal breath sounds.  Abdominal:     Palpations: Abdomen is soft.     Tenderness: There is abdominal tenderness (Mild to moderate).    Skin:    General: Skin is warm and dry.  Neurological:     Mental Status: He is alert.      ED Treatments / Results  Labs (all labs ordered are listed, but only abnormal results are displayed) Labs Reviewed  COMPREHENSIVE METABOLIC PANEL - Abnormal; Notable for the following components:      Result Value   Glucose, Bld 113 (*)    Creatinine, Ser  1.35 (*)    Total Protein 9.3 (*)    AST 50 (*)    ALT 52 (*)    Total Bilirubin 1.6 (*)    All other components within normal limits  URINALYSIS, ROUTINE W REFLEX MICROSCOPIC - Abnormal; Notable for the following components:   Hgb urine dipstick SMALL (*)    Ketones, ur 80 (*)    Protein, ur 30 (*)    All other components within normal limits  RAPID URINE DRUG SCREEN, HOSP PERFORMED - Abnormal; Notable for the following components:  Amphetamines POSITIVE (*)    Tetrahydrocannabinol POSITIVE (*)    All other components within normal limits  LIPASE, BLOOD  CBC    EKG None  Radiology No results found.  Procedures Procedures (including critical care time)  Medications Ordered in ED Medications  sodium chloride 0.9 % bolus 1,000 mL ( Intravenous Stopped 08/30/19 1323)  ondansetron (ZOFRAN) injection 4 mg (4 mg Intravenous Given 08/30/19 1138)     Initial Impression / Assessment and Plan / ED Course  I have reviewed the triage vital signs and the nursing notes.  Pertinent labs & imaging results that were available during my care of the patient were reviewed by me and considered in my medical decision making (see chart for details).        Patient seen and examined. Work-up initiated. Fluids ordered. Pt declines any pain medications as he is in recovery.   Vital signs reviewed and are as follows: BP (!) 128/103   Pulse (!) 120   Temp 98.3 F (36.8 C) (Oral)   Resp 17   SpO2 100%   3:36 PM patient clinically improving during the ED stay.  Heart rate improving as well.  No further vomiting.  I spoke with the patient several times and reexamined him.  He tells me several times about his recent relapse with drugs.  He is a bit unclear, but seems to be hinting at needing some sort of rehab to help stay clean.  I have placed a peer consult to assist the patient.  Patient is also on the phone at one point with his previous rehab.  I have encouraged him to follow-up with these  if he feels the need to.  As far as his abdominal pain, lab work-up is overall reassuring.  Patient has been given IV fluids for hydration with good response.  Normal white blood cell count.  No signs of peritonitis on exam.  I do not feel strongly that the patient requires CT imaging today.  I would like to treat him empirically with Pepcid, Zofran, Carafate and have him on a bland diet for a few days to see how things respond.  He may be having an element of gastritis and is encouraged to avoid NSAIDs and alcohol.  He may also be having an element of drug withdrawal.  The patient was urged to return to the Emergency Department immediately with worsening of current symptoms, worsening abdominal pain, persistent vomiting, blood noted in stools, fever, or any other concerns. The patient verbalized understanding.   BP (!) 152/132   Pulse 64   Temp 98.3 F (36.8 C) (Oral)   Resp 16   SpO2 97%    Final Clinical Impressions(s) / ED Diagnoses   Final diagnoses:  Non-intractable vomiting with nausea, unspecified vomiting type  Dehydration  Substance abuse (HCC)   Patient with abdominal pain, nausea and vomiting, in setting of recent drug use. Vitals are stable, no fever. Labs with normal white blood cell count, slightly elevated renal function treated with IV fluids. Imaging not felt indicated at this time given reassuring lab work-up and exam. Patient is tolerating PO's. Lungs are clear and no signs suggestive of PNA. Low concern for appendicitis, cholecystitis, pancreatitis, ruptured viscus, UTI, kidney stone, aortic dissection, aortic aneurysm or other emergent abdominal etiology. Supportive therapy indicated with return if symptoms worsen.    ED Discharge Orders         Ordered    ondansetron (ZOFRAN ODT) 4 MG disintegrating tablet  Every 8  hours PRN     08/30/19 1531    famotidine (PEPCID) 20 MG tablet  2 times daily     08/30/19 1531    sucralfate (CARAFATE) 1 g tablet  3 times daily  with meals & bedtime     08/30/19 1531           Carlisle Cater, PA-C 08/30/19 1539    Maudie Flakes, MD 09/01/19 [phone]

## 2019-08-30 NOTE — ED Notes (Signed)
Urinal at bedside. Pt has been made aware that provider needs UA sample.

## 2019-11-04 ENCOUNTER — Emergency Department (HOSPITAL_COMMUNITY): Payer: Self-pay

## 2019-11-04 ENCOUNTER — Emergency Department (HOSPITAL_COMMUNITY)
Admission: EM | Admit: 2019-11-04 | Discharge: 2019-11-04 | Disposition: A | Discharge status: Home / Self Care | Payer: Self-pay | Attending: Emergency Medicine | Admitting: Emergency Medicine

## 2019-11-04 ENCOUNTER — Encounter (HOSPITAL_COMMUNITY): Payer: Self-pay | Admitting: *Deleted

## 2019-11-04 DIAGNOSIS — F1910 Other psychoactive substance abuse, uncomplicated: Secondary | ICD-10-CM | POA: Insufficient documentation

## 2019-11-04 DIAGNOSIS — R252 Cramp and spasm: Secondary | ICD-10-CM | POA: Insufficient documentation

## 2019-11-04 DIAGNOSIS — N50819 Testicular pain, unspecified: Secondary | ICD-10-CM | POA: Insufficient documentation

## 2019-11-04 DIAGNOSIS — R0981 Nasal congestion: Secondary | ICD-10-CM | POA: Insufficient documentation

## 2019-11-04 DIAGNOSIS — R531 Weakness: Secondary | ICD-10-CM | POA: Insufficient documentation

## 2019-11-04 DIAGNOSIS — Z20822 Contact with and (suspected) exposure to covid-19: Secondary | ICD-10-CM | POA: Insufficient documentation

## 2019-11-04 DIAGNOSIS — R112 Nausea with vomiting, unspecified: Secondary | ICD-10-CM | POA: Insufficient documentation

## 2019-11-04 DIAGNOSIS — J029 Acute pharyngitis, unspecified: Secondary | ICD-10-CM | POA: Insufficient documentation

## 2019-11-04 DIAGNOSIS — R5383 Other fatigue: Secondary | ICD-10-CM | POA: Insufficient documentation

## 2019-11-04 DIAGNOSIS — R509 Fever, unspecified: Secondary | ICD-10-CM | POA: Insufficient documentation

## 2019-11-04 DIAGNOSIS — R05 Cough: Secondary | ICD-10-CM | POA: Insufficient documentation

## 2019-11-04 DIAGNOSIS — Z79899 Other long term (current) drug therapy: Secondary | ICD-10-CM | POA: Insufficient documentation

## 2019-11-04 DIAGNOSIS — R109 Unspecified abdominal pain: Secondary | ICD-10-CM | POA: Insufficient documentation

## 2019-11-04 DIAGNOSIS — R197 Diarrhea, unspecified: Secondary | ICD-10-CM | POA: Insufficient documentation

## 2019-11-04 DIAGNOSIS — M791 Myalgia, unspecified site: Secondary | ICD-10-CM | POA: Insufficient documentation

## 2019-11-04 DIAGNOSIS — F17210 Nicotine dependence, cigarettes, uncomplicated: Secondary | ICD-10-CM | POA: Insufficient documentation

## 2019-11-04 LAB — URINALYSIS, ROUTINE W REFLEX MICROSCOPIC
Bilirubin Urine: NEGATIVE
Glucose, UA: NEGATIVE mg/dL
Hgb urine dipstick: NEGATIVE
Ketones, ur: 20 mg/dL — AB
Leukocytes,Ua: NEGATIVE
Nitrite: NEGATIVE
Protein, ur: 100 mg/dL — AB
Specific Gravity, Urine: 1.028 (ref 1.005–1.030)
pH: 6 (ref 5.0–8.0)

## 2019-11-04 LAB — CBC
HCT: 50 % (ref 39.0–52.0)
Hemoglobin: 16.8 g/dL (ref 13.0–17.0)
MCH: 30.4 pg (ref 26.0–34.0)
MCHC: 33.6 g/dL (ref 30.0–36.0)
MCV: 90.6 fL (ref 80.0–100.0)
Platelets: 314 10*3/uL (ref 150–400)
RBC: 5.52 MIL/uL (ref 4.22–5.81)
RDW: 14.5 % (ref 11.5–15.5)
WBC: 12.2 10*3/uL — ABNORMAL HIGH (ref 4.0–10.5)
nRBC: 0 % (ref 0.0–0.2)

## 2019-11-04 LAB — COMPREHENSIVE METABOLIC PANEL
ALT: 32 U/L (ref 0–44)
AST: 32 U/L (ref 15–41)
Albumin: 5.1 g/dL — ABNORMAL HIGH (ref 3.5–5.0)
Alkaline Phosphatase: 86 U/L (ref 38–126)
Anion gap: 17 — ABNORMAL HIGH (ref 5–15)
BUN: 25 mg/dL — ABNORMAL HIGH (ref 6–20)
CO2: 26 mmol/L (ref 22–32)
Calcium: 10.4 mg/dL — ABNORMAL HIGH (ref 8.9–10.3)
Chloride: 97 mmol/L — ABNORMAL LOW (ref 98–111)
Creatinine, Ser: 1.47 mg/dL — ABNORMAL HIGH (ref 0.61–1.24)
GFR calc Af Amer: >60 mL/min (ref >60)
GFR calc non Af Amer: >60 mL/min (ref >60)
Glucose, Bld: 115 mg/dL — ABNORMAL HIGH (ref 70–99)
Potassium: 3.7 mmol/L (ref 3.5–5.1)
Sodium: 140 mmol/L (ref 135–145)
Total Bilirubin: 0.9 mg/dL (ref 0.3–1.2)
Total Protein: 9.2 g/dL — ABNORMAL HIGH (ref 6.5–8.1)

## 2019-11-04 LAB — RAPID URINE DRUG SCREEN, HOSP PERFORMED
Amphetamines: POSITIVE — AB
Barbiturates: NOT DETECTED
Benzodiazepines: NOT DETECTED
Cocaine: NOT DETECTED
Opiates: NOT DETECTED
Tetrahydrocannabinol: POSITIVE — AB

## 2019-11-04 LAB — RESPIRATORY PANEL BY RT PCR (FLU A&B, COVID)
Influenza A by PCR: NEGATIVE
Influenza B by PCR: NEGATIVE
SARS Coronavirus 2 by RT PCR: NEGATIVE

## 2019-11-04 LAB — LIPASE, BLOOD: Lipase: 22 U/L (ref 11–51)

## 2019-11-04 LAB — POC SARS CORONAVIRUS 2 AG -  ED: SARS Coronavirus 2 Ag: NEGATIVE

## 2019-11-04 MED ORDER — ONDANSETRON 4 MG PO TBDP: ORAL_TABLET | ORAL | 0 refills | Status: DC

## 2019-11-04 MED ORDER — ONDANSETRON HCL 4 MG/2ML IJ SOLN
4.0000 mg | Freq: Once | INTRAMUSCULAR | Status: AC
  Administered 2019-11-04: 21:00:00 4 mg via INTRAVENOUS
  Filled 2019-11-04: qty 2

## 2019-11-04 MED ORDER — KETOROLAC TROMETHAMINE 30 MG/ML IJ SOLN
15.0000 mg | Freq: Once | INTRAMUSCULAR | Status: AC
  Administered 2019-11-04: 21:00:00 15 mg via INTRAVENOUS
  Filled 2019-11-04: qty 1

## 2019-11-04 MED ORDER — PANTOPRAZOLE SODIUM 40 MG IV SOLR
40.0000 mg | Freq: Once | INTRAVENOUS | Status: AC
  Administered 2019-11-04: 21:00:00 40 mg via INTRAVENOUS
  Filled 2019-11-04: qty 40

## 2019-11-04 MED ORDER — ONDANSETRON HCL 4 MG/2ML IJ SOLN
4.0000 mg | Freq: Once | INTRAMUSCULAR | Status: AC
  Administered 2019-11-04: 18:00:00 4 mg via INTRAVENOUS
  Filled 2019-11-04: qty 2

## 2019-11-04 MED ORDER — SODIUM CHLORIDE 0.9 % IV BOLUS
1000.0000 mL | Freq: Once | INTRAVENOUS | Status: AC
  Administered 2019-11-04: 17:00:00 1000 mL via INTRAVENOUS

## 2019-11-04 MED ORDER — SODIUM CHLORIDE 0.9 % IV BOLUS
1000.0000 mL | Freq: Once | INTRAVENOUS | Status: AC
  Administered 2019-11-04: 21:00:00 1000 mL via INTRAVENOUS

## 2019-11-04 MED ORDER — SODIUM CHLORIDE 0.9% FLUSH: 3.0000 mL | Freq: Once | INTRAVENOUS | Status: DC

## 2019-11-04 MED ORDER — NALOXONE HCL 4 MG/0.1ML NA LIQD
1.0000 | Freq: Once | NASAL | Status: AC
  Administered 2019-11-04: 1 via NASAL
  Filled 2019-11-04: qty 4

## 2019-11-04 MED ORDER — LORAZEPAM 2 MG/ML IJ SOLN
1.0000 mg | Freq: Once | INTRAMUSCULAR | Status: AC
  Administered 2019-11-04: 19:00:00 1 mg via INTRAVENOUS
  Filled 2019-11-04: qty 1

## 2019-11-04 NOTE — ED Notes (Signed)
unableto do fluid chalange  nause aand vomitig

## 2019-11-04 NOTE — ED Notes (Signed)
The pt is c/o feeling weak for 24 48 hours and his abd is burning  He has had n v and he is unable to eat without problems  Recent drug use

## 2019-11-04 NOTE — ED Triage Notes (Addendum)
Pt is here for generalized illness.  Pt states that he used heroin last Friday 10/28/2019 and was initially "dope sick" but feels like since he continues to be unwell it might be covid.  Pt is anxious and hyperventilating and describes numbness and tingling to his face and hands and cramping in hand.  Pt has called ems out and they evaluated him and felt that his symptoms were due to withdrawal from heroin.  Pt is reporting chest pain, sob, numbness in face and arms (intermittent) generalized weakness, runny nose, nausea, vomiting, diarrhea (which he states is black).

## 2019-11-04 NOTE — ED Notes (Signed)
C/o nausea 

## 2019-11-04 NOTE — ED Notes (Signed)
Pt staying on his call bell almost constantly.  He is c/o total body spams  He reports that he cannot ev en cover himself with the blaNKET.  Nausea  And vomiting

## 2019-11-04 NOTE — ED Provider Notes (Signed)
[facility] EMERGENCY DEPARTMENT Provider Note   CSN: [id number] Arrival date & time: 11/04/19  1141     History Chief Complaint  Patient presents with  . Illness    Brian [name] is a 34 y.o. male.  Patient is a 34 year old male who presents with fever and achiness.  He has a history of polysubstance abuse and hepatitis C.  He states that he was recently in jail for 21 months and got out not too long ago.  He said that was a trigger for him to start doing drugs again.  Last week he snorted some heroin.  He has been feeling fatigued since then and started having some vomiting yesterday.  He initially thought he was "dope sick" but his symptoms have lasted longer than they typically do with that.  He started having some chills and a fever up to 102 yesterday.  He has had ongoing nausea and vomiting since yesterday with some associated loose stools.  He denies any hematemesis.  He has a bit of a sore throat and some congestion and coughing.  He said that he has not been taking Covid precautions and does not typically wear a mask.  He denies any IV drug use in the last 2 years.  He states he is only been snorting it.  He has some achy pain in his right abdomen but says he is already had his appendix removed.  He denies any urinary symptoms.        Past Medical History:  Diagnosis Date  . Alcohol abuse   . Anxiety   . Cocaine abuse ([place])   . Depression   . Kidney stones   . Narcotic abuse ([place])   . Peptic ulcer   . Polysubstance abuse ([place])   . Renal disorder    kidney stones    Patient Active Problem List   Diagnosis Date Noted  . Amphetamine use disorder, severe, dependence ([place]) 03/26/2018  . Substance induced mood disorder ([place]) 03/26/2018  . Bipolar II disorder, severe, depressed, with anxious distress ([place]) 03/25/2018  . Cellulitis 05/20/2015  . Dysuria 05/20/2015  . Smoker 05/20/2015  . Homeless single person 05/20/2015  . Drug abuse ([place])   .  Opioid use disorder, severe, dependence ([place]) 03/08/2015  . Ileus of unspecified type ([place]) 05/28/2014  . Abdominal pain 05/28/2014  . AKI (acute kidney injury) ([place]) 05/28/2014  . Esophagitis, acute 05/28/2014  . Polysubstance abuse ([place]) 05/28/2014  . Hematemesis 05/28/2014    Past Surgical History:  Procedure Laterality Date  . ESOPHAGOGASTRODUODENOSCOPY N/A 05/29/2014   Procedure: ESOPHAGOGASTRODUODENOSCOPY (EGD) ;  Surgeon: Beryle Beams, MD;  Location: [facility] ENDOSCOPY;  Service: Endoscopy;  Laterality: N/A;  check with Dr. Collene Mares about sedation type/timinng - I recommend MAC  . HAND SURGERY  2006       No family history on file.  Social History   Tobacco Use  . Smoking status: Current Every Day Smoker    Packs/day: 0.50    Types: Cigarettes  . Smokeless tobacco: Never Used  Substance Use Topics  . Alcohol use: Not Currently  . Drug use: Not Currently    Types: Marijuana, Cocaine, IV    Comment: in rehab    Home Medications Prior to Admission medications   Medication Sig Start Date End Date Taking? Authorizing Provider  gabapentin (NEURONTIN) 300 MG capsule Take 2 capsules (600 mg total) by mouth 3 (three) times daily. For agitation Patient taking differently: Take 300 mg by mouth  4 (four) times daily. For agitation 03/31/18  Yes Lindell Spar I, NP  ibuprofen (ADVIL) 200 MG tablet Take 400 mg by mouth every 6 (six) hours as needed for fever, headache or moderate pain.   Yes [provider]  mirtazapine (REMERON) 30 MG tablet Take 30 mg by mouth at bedtime.   Yes [provider]  OLANZapine (ZYPREXA) 10 MG tablet Take 10 mg by mouth at bedtime.   Yes [provider]  PE-Doxylamine-DM-GG-APAP (TYLENOL COLD & FLU DAY/NIGHT PO) Take 1 capsule by mouth every 12 (twelve) hours as needed (fever).   Yes [provider]  famotidine (PEPCID) 20 MG tablet Take 1 tablet (20 mg total) by mouth 2 (two) times daily. Patient not taking: Reported on  11/04/2019 08/30/19   Lorayne Bender, PA-C  FLUoxetine (PROZAC) 20 MG capsule Take 1 capsule (20 mg total) by mouth daily. For depression Patient not taking: Reported on 11/04/2019 04/01/18   Lindell Spar I, NP  mirtazapine (REMERON) 7.5 MG tablet Take 1 tablet (7.5 mg total) by mouth at bedtime. For sleep Patient not taking: Reported on 11/04/2019 03/31/18   Lindell Spar I, NP  ondansetron (ZOFRAN ODT) 4 MG disintegrating tablet 64m ODT q4 hours prn nausea/vomit 11/04/19   BMalvin Johns MD  sucralfate (CARAFATE) 1 g tablet Take 1 tablet (1 g total) by mouth 4 (four) times daily -  with meals and at bedtime. Patient not taking: Reported on 11/04/2019 08/30/19   JLorayne Bender PA-C  traZODone (DESYREL) 50 MG tablet Take 1 tablet (50 mg total) by mouth at bedtime as needed for sleep. Patient not taking: Reported on 08/30/2019 03/31/18 08/30/19  NLindell SparI, NP    Allergies    Fish allergy and Shellfish allergy  Review of Systems   Review of Systems  Constitutional: Positive for chills, fatigue and fever. Negative for diaphoresis.  HENT: Positive for congestion and sore throat. Negative for rhinorrhea and sneezing.   Eyes: Negative.   Respiratory: Positive for cough. Negative for chest tightness and shortness of breath.   Cardiovascular: Negative for chest pain and leg swelling.  Gastrointestinal: Positive for abdominal pain, diarrhea, nausea and vomiting. Negative for blood in stool.  Genitourinary: Negative for difficulty urinating, flank pain, frequency and hematuria.  Musculoskeletal: Positive for myalgias. Negative for arthralgias and back pain.  Skin: Negative for rash.  Neurological: Negative for dizziness, speech difficulty, weakness, numbness and headaches.    Physical Exam Updated Vital Signs BP 117/83   Pulse 68   Temp 98.6 F (37 C) (Oral)   Resp 11   Wt 81.6 kg   SpO2 100%   BMI 27.37 kg/m   Physical Exam Constitutional:      Appearance: He is well-developed.  HENT:     Head:  Normocephalic and atraumatic.  Eyes:     Pupils: Pupils are equal, round, and reactive to light.  Cardiovascular:     Rate and Rhythm: Normal rate and regular rhythm.     Heart sounds: Normal heart sounds.     Comments: No murmur noted Pulmonary:     Effort: Pulmonary effort is normal. No respiratory distress.     Breath sounds: Normal breath sounds. No wheezing or rales.  Chest:     Chest wall: No tenderness.  Abdominal:     General: Bowel sounds are normal.     Palpations: Abdomen is soft.     Tenderness: There is no abdominal tenderness. There is no guarding or rebound.  Musculoskeletal:  General: Normal range of motion.     Cervical back: Normal range of motion and neck supple.  Lymphadenopathy:     Cervical: No cervical adenopathy.  Skin:    General: Skin is warm and dry.     Findings: No rash.  Neurological:     Mental Status: He is alert and oriented to person, place, and time.     ED Results / Procedures / Treatments   Labs (all labs ordered are listed, but only abnormal results are displayed) Labs Reviewed  COMPREHENSIVE METABOLIC PANEL - Abnormal; Notable for the following components:      Result Value   Chloride 97 (*)    Glucose, Bld 115 (*)    BUN 25 (*)    Creatinine, Ser 1.47 (*)    Calcium 10.4 (*)    Total Protein 9.2 (*)    Albumin 5.1 (*)    Anion gap 17 (*)    All other components within normal limits  CBC - Abnormal; Notable for the following components:   WBC 12.2 (*)    All other components within normal limits  URINALYSIS, ROUTINE W REFLEX MICROSCOPIC - Abnormal; Notable for the following components:   Ketones, ur 20 (*)    Protein, ur 100 (*)    Bacteria, UA RARE (*)    All other components within normal limits  RAPID URINE DRUG SCREEN, HOSP PERFORMED - Abnormal; Notable for the following components:   Amphetamines POSITIVE (*)    Tetrahydrocannabinol POSITIVE (*)    All other components within normal limits  RESPIRATORY PANEL BY  RT PCR (FLU A&B, COVID)  LIPASE, BLOOD  POC SARS CORONAVIRUS 2 AG -  ED    EKG EKG Interpretation  Date/Time:  Friday November 04 2019 17:23:11 EST Ventricular Rate:  63 PR Interval:  120 QRS Duration: 86 QT Interval:  424 QTC Calculation: 434 R Axis:   93 Text Interpretation: Sinus rhythm Borderline right axis deviation Baseline wander in lead(s) V4 V5 V6 Confirmed by Malvin Johns [phone]) on 11/04/2019 5:32:43 PM   Radiology DG Chest Port 1 View  Result Date: 11/04/2019 CLINICAL DATA:  Cough and fever EXAM: PORTABLE CHEST 1 VIEW COMPARISON:  05/02/2014 FINDINGS: The heart size and mediastinal contours are within normal limits. Both lungs are clear. The visualized skeletal structures are unremarkable. IMPRESSION: No active disease. Electronically Signed   By: Donavan Foil M.D.   On: 11/04/2019 20:11    Procedures Procedures (including critical care time)  Medications Ordered in ED Medications  sodium chloride flush (NS) 0.9 % injection 3 mL (has no administration in time range)  naloxone (NARCAN) nasal spray 4 mg/0.1 mL (has no administration in time range)  sodium chloride 0.9 % bolus 1,000 mL (0 mLs Intravenous Stopped 11/04/19 1930)  ondansetron (ZOFRAN) injection 4 mg (4 mg Intravenous Given 11/04/19 1736)  LORazepam (ATIVAN) injection 1 mg (1 mg Intravenous Given 11/04/19 1907)  sodium chloride 0.9 % bolus 1,000 mL (1,000 mLs Intravenous New Bag/Given 11/04/19 2101)  pantoprazole (PROTONIX) injection 40 mg (40 mg Intravenous Given 11/04/19 2051)  ketorolac (TORADOL) 30 MG/ML injection 15 mg (15 mg Intravenous Given 11/04/19 2052)  ondansetron (ZOFRAN) injection 4 mg (4 mg Intravenous Given 11/04/19 2051)    ED Course  I have reviewed the triage vital signs and the nursing notes.  Pertinent labs & imaging results that were available during my care of the patient were reviewed by me and considered in my medical decision making (see chart for details).  MDM Rules/Calculators/A&P                       Patient is a 34 year old male who presents vomiting and upper abdominal pain.  He recently used heroin although he denies any other drug use.  His drug screen was positive for amphetamines and THC.  He denies any recent alcohol use.  He did have a reported fever yesterday but no fever here.  I do not hear any murmur and he does not have any other concerning signs/symptoms of endocarditis.  He has been afebrile the entire ED visit here.  His labs show mild elevation in his white count.  His creatinine is elevated but is only slightly more elevated than it has been in the past.  He was given IV fluids and antiemetics.  Following this he was feeling much better and was able to tolerate oral fluids.  His abdominal pain has resolved.  I do not feel that he needs further imaging of this.  He has had a negative Covid test.  His urine does not show signs of infection.  He was discharged home in good condition.  He was dispensed a Narcan nasal kit.  He was offered peer support but he says he already has follow-up with infectious disease and peers support and he does not need this.  Return precautions were given. Final Clinical Impression(s) / ED Diagnoses Final diagnoses:  Non-intractable vomiting with nausea, unspecified vomiting type  Polysubstance abuse ([place])    Rx / DC Orders ED Discharge Orders         Ordered    ondansetron (ZOFRAN ODT) 4 MG disintegrating tablet     11/04/19 2204           Malvin Johns, MD 11/04/19 2208

## 2019-11-04 NOTE — ED Notes (Signed)
The pt continues to vomit intermittently

## 2019-11-04 NOTE — ED Notes (Signed)
Pt c/o tescticle pain for one and one-half months  He was just recently treated  For a std  No bloody urine  aand o x4

## 2020-05-01 ENCOUNTER — Encounter (HOSPITAL_COMMUNITY): Payer: Self-pay | Admitting: Obstetrics and Gynecology

## 2020-05-01 ENCOUNTER — Emergency Department (HOSPITAL_COMMUNITY)
Admission: EM | Admit: 2020-05-01 | Discharge: 2020-05-02 | Disposition: A | Discharge status: Home / Self Care | Payer: Self-pay | Attending: Emergency Medicine | Admitting: Emergency Medicine

## 2020-05-01 DIAGNOSIS — Z20822 Contact with and (suspected) exposure to covid-19: Secondary | ICD-10-CM | POA: Insufficient documentation

## 2020-05-01 DIAGNOSIS — F332 Major depressive disorder, recurrent severe without psychotic features: Secondary | ICD-10-CM | POA: Insufficient documentation

## 2020-05-01 DIAGNOSIS — F1210 Cannabis abuse, uncomplicated: Secondary | ICD-10-CM | POA: Insufficient documentation

## 2020-05-01 DIAGNOSIS — R45851 Suicidal ideations: Secondary | ICD-10-CM | POA: Insufficient documentation

## 2020-05-01 DIAGNOSIS — F17210 Nicotine dependence, cigarettes, uncomplicated: Secondary | ICD-10-CM | POA: Insufficient documentation

## 2020-05-01 DIAGNOSIS — F1910 Other psychoactive substance abuse, uncomplicated: Secondary | ICD-10-CM | POA: Insufficient documentation

## 2020-05-01 LAB — CBC
HCT: 44.5 % (ref 39.0–52.0)
Hemoglobin: 14.4 g/dL (ref 13.0–17.0)
MCH: 30.3 pg (ref 26.0–34.0)
MCHC: 32.4 g/dL (ref 30.0–36.0)
MCV: 93.7 fL (ref 80.0–100.0)
Platelets: 293 10*3/uL (ref 150–400)
RBC: 4.75 MIL/uL (ref 4.22–5.81)
RDW: 13.4 % (ref 11.5–15.5)
WBC: 9.9 10*3/uL (ref 4.0–10.5)
nRBC: 0 % (ref 0.0–0.2)

## 2020-05-01 LAB — COMPREHENSIVE METABOLIC PANEL
ALT: 33 U/L (ref 0–44)
AST: 20 U/L (ref 15–41)
Albumin: 4.5 g/dL (ref 3.5–5.0)
Alkaline Phosphatase: 84 U/L (ref 38–126)
Anion gap: 6 (ref 5–15)
BUN: 18 mg/dL (ref 6–20)
CO2: 29 mmol/L (ref 22–32)
Calcium: 9 mg/dL (ref 8.9–10.3)
Chloride: 107 mmol/L (ref 98–111)
Creatinine, Ser: 1.21 mg/dL (ref 0.61–1.24)
GFR calc Af Amer: >60 mL/min (ref >60)
GFR calc non Af Amer: >60 mL/min (ref >60)
Glucose, Bld: 81 mg/dL (ref 70–99)
Potassium: 4.4 mmol/L (ref 3.5–5.1)
Sodium: 142 mmol/L (ref 135–145)
Total Bilirubin: 0.5 mg/dL (ref 0.3–1.2)
Total Protein: 8.1 g/dL (ref 6.5–8.1)

## 2020-05-01 LAB — RAPID URINE DRUG SCREEN, HOSP PERFORMED
Amphetamines: POSITIVE — AB
Barbiturates: NOT DETECTED
Benzodiazepines: POSITIVE — AB
Cocaine: NOT DETECTED
Opiates: NOT DETECTED
Tetrahydrocannabinol: POSITIVE — AB

## 2020-05-01 LAB — CK: Total CK: 88 U/L (ref 49–397)

## 2020-05-01 LAB — SARS CORONAVIRUS 2 BY RT PCR (HOSPITAL ORDER, PERFORMED IN ~~LOC~~ HOSPITAL LAB): SARS Coronavirus 2: NEGATIVE

## 2020-05-01 LAB — HIV ANTIBODY (ROUTINE TESTING W REFLEX): HIV Screen 4th Generation wRfx: NONREACTIVE

## 2020-05-01 LAB — SALICYLATE LEVEL: Salicylate Lvl: <7 mg/dL — ABNORMAL LOW (ref 7.0–30.0)

## 2020-05-01 LAB — ACETAMINOPHEN LEVEL: Acetaminophen (Tylenol), Serum: <10 ug/mL — ABNORMAL LOW (ref 10–30)

## 2020-05-01 LAB — ETHANOL: Alcohol, Ethyl (B): <10 mg/dL (ref <10)

## 2020-05-01 MED ORDER — LORAZEPAM 1 MG PO TABS
1.0000 mg | ORAL_TABLET | Freq: Once | ORAL | Status: AC
  Administered 2020-05-01: 1 mg via ORAL
  Filled 2020-05-01: qty 1

## 2020-05-01 NOTE — ED Provider Notes (Signed)
[facility] DEPT Provider Note   CSN: [id number] Arrival date & time: 05/01/20  1205     History Chief Complaint  Patient presents with  . Addiction Problem  . Suicidal    Brian [name] is a 34 y.o. male with past medical history of polysubstance abuse including narcotics, cocaine, alcohol, amphetamines, bipolar, anxiety, homelessness, who presents today for evaluation of suicidal and homicidal ideation.  He reports that he has left [place] where he was living before.  He states that he is on drugs, he "does not know" what drugs he is on.  He reports that he wants to be dead.  He has a plan to get some heroin.  He is vague homicidal ideations, does not have a specific person.  He denies AVH.  He reports that in the past three days he has felt like "ground hog day" that things on the TV are directed at him and that its all been like this before.  He states he thinks he was getting laced drugs.  He reports that he snorts his drugs, denies injection drug use in "many years."    He reports body aches.  No fevers.  No cough or shortness of breath.   HPI     Past Medical History:  Diagnosis Date  . Alcohol abuse   . Anxiety   . Cocaine abuse ([place])   . Depression   . Kidney stones   . Narcotic abuse ([place])   . Peptic ulcer   . Polysubstance abuse ([place])   . Renal disorder    kidney stones    Patient Active Problem List   Diagnosis Date Noted  . Amphetamine use disorder, severe, dependence ([place]) 03/26/2018  . Substance induced mood disorder ([place]) 03/26/2018  . Bipolar II disorder, severe, depressed, with anxious distress ([place]) 03/25/2018  . Cellulitis 05/20/2015  . Dysuria 05/20/2015  . Smoker 05/20/2015  . Homeless single person 05/20/2015  . Drug abuse ([place])   . Opioid use disorder, severe, dependence ([place]) 03/08/2015  . Ileus of unspecified type ([place]) 05/28/2014  . Abdominal pain 05/28/2014  . AKI (acute kidney injury) ([place]) 05/28/2014    . Esophagitis, acute 05/28/2014  . Polysubstance abuse ([place]) 05/28/2014  . Hematemesis 05/28/2014    Past Surgical History:  Procedure Laterality Date  . ESOPHAGOGASTRODUODENOSCOPY N/A 05/29/2014   Procedure: ESOPHAGOGASTRODUODENOSCOPY (EGD) ;  Surgeon: Beryle Beams, MD;  Location: [facility] ENDOSCOPY;  Service: Endoscopy;  Laterality: N/A;  check with Dr. Collene Mares about sedation type/timinng - I recommend MAC  . HAND SURGERY  2006       No family history on file.  Social History   Tobacco Use  . Smoking status: Current Every Day Smoker    Packs/day: 0.50    Types: Cigarettes  . Smokeless tobacco: Never Used  Vaping Use  . Vaping Use: Never used  Substance Use Topics  . Alcohol use: Not Currently  . Drug use: Not Currently    Types: Marijuana, Cocaine, IV    Home Medications Prior to Admission medications   Medication Sig Start Date End Date Taking? Authorizing Provider  famotidine (PEPCID) 20 MG tablet Take 1 tablet (20 mg total) by mouth 2 (two) times daily. Patient not taking: Reported on 11/04/2019 08/30/19   Lorayne Bender, PA-C  FLUoxetine (PROZAC) 20 MG capsule Take 1 capsule (20 mg total) by mouth daily. For depression Patient not taking: Reported on 11/04/2019 04/01/18   Lindell Spar I, NP  gabapentin (NEURONTIN) 300  MG capsule Take 2 capsules (600 mg total) by mouth 3 (three) times daily. For agitation Patient taking differently: Take 300 mg by mouth 4 (four) times daily. For agitation 03/31/18   Lindell Spar I, NP  ibuprofen (ADVIL) 200 MG tablet Take 400 mg by mouth every 6 (six) hours as needed for fever, headache or moderate pain.    [provider]  mirtazapine (REMERON) 30 MG tablet Take 30 mg by mouth at bedtime.    [provider]  mirtazapine (REMERON) 7.5 MG tablet Take 1 tablet (7.5 mg total) by mouth at bedtime. For sleep Patient not taking: Reported on 11/04/2019 03/31/18   Lindell Spar I, NP  OLANZapine (ZYPREXA) 10 MG tablet Take 10 mg by mouth at  bedtime.    [provider]  ondansetron (ZOFRAN ODT) 4 MG disintegrating tablet 33m ODT q4 hours prn nausea/vomit 11/04/19   BMalvin Johns MD  PE-Doxylamine-DM-GG-APAP (TYLENOL COLD & FLU DAY/NIGHT PO) Take 1 capsule by mouth every 12 (twelve) hours as needed (fever).    [provider]  sucralfate (CARAFATE) 1 g tablet Take 1 tablet (1 g total) by mouth 4 (four) times daily -  with meals and at bedtime. Patient not taking: Reported on 11/04/2019 08/30/19   JLorayne Bender PA-C  traZODone (DESYREL) 50 MG tablet Take 1 tablet (50 mg total) by mouth at bedtime as needed for sleep. Patient not taking: Reported on 08/30/2019 03/31/18 08/30/19  NLindell SparI, NP    Allergies    Fish allergy and Shellfish allergy  Review of Systems   Review of Systems  Constitutional: Positive for fatigue. Negative for chills and fever.  Respiratory: Negative for chest tightness and shortness of breath.   Cardiovascular: Negative for chest pain.  Gastrointestinal: Negative for abdominal pain.  Psychiatric/Behavioral: Positive for behavioral problems, confusion, sleep disturbance and suicidal ideas. The patient is nervous/anxious.   All other systems reviewed and are negative.   Physical Exam Updated Vital Signs BP 117/84 (BP Location: Left Arm)   Pulse 98   Temp 97.9 F (36.6 C) (Oral)   Resp 16   Ht _0  (1.727 m)   Wt 63.5 kg   SpO2 100%   BMI 21.29 kg/m   Physical Exam Vitals and nursing note reviewed.  Constitutional:      General: He is not in acute distress.    Appearance: He is well-developed. He is not diaphoretic.  HENT:     Head: Normocephalic and atraumatic.  Eyes:     General: No scleral icterus.       Right eye: No discharge.        Left eye: No discharge.     Conjunctiva/sclera: Conjunctivae normal.  Cardiovascular:     Rate and Rhythm: Normal rate and regular rhythm.  Pulmonary:     Effort: Pulmonary effort is normal. No respiratory distress.     Breath sounds:  No stridor.  Abdominal:     General: There is no distension.  Musculoskeletal:        General: No deformity.     Cervical back: Normal range of motion.  Skin:    General: Skin is warm and dry.  Neurological:     General: No focal deficit present.     Mental Status: He is alert.     Cranial Nerves: No cranial nerve deficit.     Motor: No abnormal muscle tone.  Psychiatric:        Attention and Perception: He is inattentive.  Mood and Affect: Mood is anxious. Affect is labile.        Speech: Speech is tangential.        Behavior: Behavior is agitated.        Thought Content: Thought content includes homicidal and suicidal ideation. Thought content includes suicidal plan. Thought content does not include homicidal plan.        Judgment: Judgment is impulsive.     ED Results / Procedures / Treatments   Labs (all labs ordered are listed, but only abnormal results are displayed) Labs Reviewed  SALICYLATE LEVEL - Abnormal; Notable for the following components:      Result Value   Salicylate Lvl <9.7 (*)    All other components within normal limits  ACETAMINOPHEN LEVEL - Abnormal; Notable for the following components:   Acetaminophen (Tylenol), Serum <10 (*)    All other components within normal limits  RAPID URINE DRUG SCREEN, HOSP PERFORMED - Abnormal; Notable for the following components:   Benzodiazepines POSITIVE (*)    Amphetamines POSITIVE (*)    Tetrahydrocannabinol POSITIVE (*)    All other components within normal limits  SARS CORONAVIRUS 2 BY RT PCR (HOSPITAL ORDER, [facility])  COMPREHENSIVE METABOLIC PANEL  ETHANOL  CBC  CK  HIV ANTIBODY (ROUTINE TESTING W REFLEX)  RPR    EKG None  Radiology No results found.  Procedures Procedures (including critical care time)  Medications Ordered in ED Medications  LORazepam (ATIVAN) tablet 1 mg (1 mg Oral Given 05/01/20 1331)    ED Course  I have reviewed the triage vital signs and  the nursing notes.  Pertinent labs & imaging results that were available during my care of the patient were reviewed by me and considered in my medical decision making (see chart for details).    MDM Rules/Calculators/A&P                         Patient is a 35 year old man who presents today for medical clearance. He is taking multiple drugs and does not know what he has been taking however he also reports SI and vague HI. He is inattentive however denies AVH.  Labs are obtained and reviewed, salicylate, acetaminophen and ethanol are undetected. Based on his reported myalgias CK was obtained which was not significantly elevated. CBC and CMP are unremarkable. Per his request HIV and syphilis testing is sent. Covid test is pending. UDS is positive for benzodiazepines, amphetamines, and THC. This was obtained before he was given Ativan. He denies alcohol use therefore he is not placed on CIWA protocol at this time. He was very jumpy and was unable to cooperate with blood draw therefore he was given p.o. Ativan.  Patient is medically clear for psychiatric evaluation and disposition.  Note: Portions of this report may have been transcribed using voice recognition software. Every effort was made to ensure accuracy; however, inadvertent computerized transcription errors may be present  Final Clinical Impression(s) / ED Diagnoses Final diagnoses:  Polysubstance abuse [facility])  Suicidal ideation    Rx / DC Orders ED Discharge Orders    None       Lorin Glass, PA-C 05/01/20 [place], DO 05/09/20 1814

## 2020-05-01 NOTE — ED Notes (Signed)
Patient unable to remain still when attempted lab draw. Patient requesting meds to help relax. RN made aware.

## 2020-05-01 NOTE — BH Assessment (Signed)
Per [name], NP, patient to remain in the ED overnight for observation. Patient's presentation is likely substance induced. Patient to be evaluated by psychiatry in the morning.

## 2020-05-01 NOTE — ED Triage Notes (Signed)
Patient reports to the ED for "I am on some drugs, I don't even know what at this point" Patient reports he has been doing meth and ice for the past while but is unsure what he is taking now.  Patient reports he wants to die and left his home in [place]. Patient reports he feels pain all over, N/V/D and weakness.

## 2020-05-01 NOTE — ED Notes (Signed)
Pt dressed out in purple scrubs & belongings collected in Triage. Pt wanded by security.

## 2020-05-01 NOTE — ED Notes (Signed)
Pt upset about being asked to be placed in [facility] by Peer Support Pt becoming loud and verbal aggressive Pt asked for food and something to drink Pt was given meal tray Pt then threw meal tray out into the hall and threw food around the room Peer Support member Aaron Edelman was able to calm the pt Security and [employer] at bedside

## 2020-05-02 ENCOUNTER — Ambulatory Visit (HOSPITAL_COMMUNITY)
Admission: EM | Admit: 2020-05-02 | Discharge: 2020-05-03 | Disposition: A | Discharge status: Home / Self Care | Payer: No Payment, Other | Attending: Psychiatric/Mental Health | Admitting: Psychiatric/Mental Health

## 2020-05-02 ENCOUNTER — Encounter (HOSPITAL_COMMUNITY): Payer: Self-pay

## 2020-05-02 ENCOUNTER — Emergency Department (HOSPITAL_COMMUNITY)
Admission: EM | Admit: 2020-05-02 | Discharge: 2020-05-02 | Disposition: A | Discharge status: Other Acute Inpatient Hospital | Payer: Self-pay | Attending: Emergency Medicine | Admitting: Emergency Medicine

## 2020-05-02 ENCOUNTER — Encounter (HOSPITAL_COMMUNITY): Payer: Self-pay | Admitting: Emergency Medicine

## 2020-05-02 ENCOUNTER — Other Ambulatory Visit: Payer: Self-pay

## 2020-05-02 DIAGNOSIS — F1990 Other psychoactive substance use, unspecified, uncomplicated: Secondary | ICD-10-CM | POA: Insufficient documentation

## 2020-05-02 DIAGNOSIS — F1520 Other stimulant dependence, uncomplicated: Secondary | ICD-10-CM

## 2020-05-02 DIAGNOSIS — Z20822 Contact with and (suspected) exposure to covid-19: Secondary | ICD-10-CM | POA: Insufficient documentation

## 2020-05-02 DIAGNOSIS — F3181 Bipolar II disorder: Secondary | ICD-10-CM | POA: Insufficient documentation

## 2020-05-02 DIAGNOSIS — F1510 Other stimulant abuse, uncomplicated: Secondary | ICD-10-CM | POA: Insufficient documentation

## 2020-05-02 DIAGNOSIS — Z79899 Other long term (current) drug therapy: Secondary | ICD-10-CM | POA: Insufficient documentation

## 2020-05-02 DIAGNOSIS — F17210 Nicotine dependence, cigarettes, uncomplicated: Secondary | ICD-10-CM | POA: Insufficient documentation

## 2020-05-02 DIAGNOSIS — R45851 Suicidal ideations: Secondary | ICD-10-CM | POA: Insufficient documentation

## 2020-05-02 DIAGNOSIS — R451 Restlessness and agitation: Secondary | ICD-10-CM

## 2020-05-02 DIAGNOSIS — Z590 Homelessness: Secondary | ICD-10-CM | POA: Insufficient documentation

## 2020-05-02 DIAGNOSIS — F419 Anxiety disorder, unspecified: Secondary | ICD-10-CM | POA: Insufficient documentation

## 2020-05-02 LAB — RPR: RPR Ser Ql: NONREACTIVE

## 2020-05-02 LAB — CBC WITH DIFFERENTIAL/PLATELET
Abs Immature Granulocytes: 0.02 10*3/uL (ref 0.00–0.07)
Basophils Absolute: 0 10*3/uL (ref 0.0–0.1)
Basophils Relative: 0 %
Eosinophils Absolute: 0.1 10*3/uL (ref 0.0–0.5)
Eosinophils Relative: 1 %
HCT: 45 % (ref 39.0–52.0)
Hemoglobin: 14.7 g/dL (ref 13.0–17.0)
Immature Granulocytes: 0 %
Lymphocytes Relative: 21 %
Lymphs Abs: 1.9 10*3/uL (ref 0.7–4.0)
MCH: 30.4 pg (ref 26.0–34.0)
MCHC: 32.7 g/dL (ref 30.0–36.0)
MCV: 93.2 fL (ref 80.0–100.0)
Monocytes Absolute: 0.7 10*3/uL (ref 0.1–1.0)
Monocytes Relative: 7 %
Neutro Abs: 6.2 10*3/uL (ref 1.7–7.7)
Neutrophils Relative %: 71 %
Platelets: 263 10*3/uL (ref 150–400)
RBC: 4.83 MIL/uL (ref 4.22–5.81)
RDW: 13.3 % (ref 11.5–15.5)
WBC: 8.9 10*3/uL (ref 4.0–10.5)
nRBC: 0 % (ref 0.0–0.2)

## 2020-05-02 LAB — COMPREHENSIVE METABOLIC PANEL
ALT: 33 U/L (ref 0–44)
AST: 20 U/L (ref 15–41)
Albumin: 4.3 g/dL (ref 3.5–5.0)
Alkaline Phosphatase: 81 U/L (ref 38–126)
Anion gap: 10 (ref 5–15)
BUN: 13 mg/dL (ref 6–20)
CO2: 26 mmol/L (ref 22–32)
Calcium: 9.3 mg/dL (ref 8.9–10.3)
Chloride: 103 mmol/L (ref 98–111)
Creatinine, Ser: 1.03 mg/dL (ref 0.61–1.24)
GFR calc Af Amer: >60 mL/min (ref >60)
GFR calc non Af Amer: >60 mL/min (ref >60)
Glucose, Bld: 96 mg/dL (ref 70–99)
Potassium: 4.4 mmol/L (ref 3.5–5.1)
Sodium: 139 mmol/L (ref 135–145)
Total Bilirubin: 0.6 mg/dL (ref 0.3–1.2)
Total Protein: 7.8 g/dL (ref 6.5–8.1)

## 2020-05-02 LAB — SALICYLATE LEVEL: Salicylate Lvl: <7 mg/dL — ABNORMAL LOW (ref 7.0–30.0)

## 2020-05-02 LAB — SARS CORONAVIRUS 2 BY RT PCR (HOSPITAL ORDER, PERFORMED IN ~~LOC~~ HOSPITAL LAB): SARS Coronavirus 2: NEGATIVE

## 2020-05-02 LAB — ACETAMINOPHEN LEVEL: Acetaminophen (Tylenol), Serum: <10 ug/mL — ABNORMAL LOW (ref 10–30)

## 2020-05-02 LAB — ETHANOL: Alcohol, Ethyl (B): <10 mg/dL (ref <10)

## 2020-05-02 MED ORDER — HYDROXYZINE HCL 25 MG PO TABS
25.0000 mg | ORAL_TABLET | Freq: Four times a day (QID) | ORAL | Status: DC | PRN
  Filled 2020-05-02: qty 1

## 2020-05-02 MED ORDER — CHLORPROMAZINE HCL 25 MG PO TABS
50.0000 mg | ORAL_TABLET | Freq: Once | ORAL | Status: AC
  Administered 2020-05-02: 50 mg via ORAL
  Filled 2020-05-02: qty 2

## 2020-05-02 MED ORDER — THIAMINE HCL 100 MG/ML IJ SOLN: 100.0000 mg | Freq: Every day | INTRAMUSCULAR | Status: DC

## 2020-05-02 MED ORDER — LORAZEPAM 2 MG/ML IJ SOLN: 0.0000 mg | Freq: Two times a day (BID) | INTRAMUSCULAR | Status: DC

## 2020-05-02 MED ORDER — LORAZEPAM 1 MG PO TABS: 0.0000 mg | ORAL_TABLET | Freq: Two times a day (BID) | ORAL | Status: DC

## 2020-05-02 MED ORDER — LORAZEPAM 1 MG PO TABS
1.0000 mg | ORAL_TABLET | Freq: Four times a day (QID) | ORAL | Status: DC | PRN
  Administered 2020-05-02 – 2020-05-03 (×2): 1 mg via ORAL
  Filled 2020-05-02 (×2): qty 1

## 2020-05-02 MED ORDER — TRAZODONE HCL 50 MG PO TABS: 50.0000 mg | ORAL_TABLET | Freq: Every evening | ORAL | Status: DC | PRN

## 2020-05-02 MED ORDER — ZIPRASIDONE MESYLATE 20 MG IM SOLR
20.0000 mg | INTRAMUSCULAR | Status: AC | PRN
  Administered 2020-05-02: 20 mg via INTRAMUSCULAR
  Filled 2020-05-02: qty 20

## 2020-05-02 MED ORDER — OLANZAPINE 10 MG PO TABS
10.0000 mg | ORAL_TABLET | Freq: Every day | ORAL | Status: DC
  Filled 2020-05-02: qty 14

## 2020-05-02 MED ORDER — GABAPENTIN 300 MG PO CAPS
300.0000 mg | ORAL_CAPSULE | Freq: Three times a day (TID) | ORAL | Status: DC
  Administered 2020-05-02 – 2020-05-03 (×2): 300 mg via ORAL
  Filled 2020-05-02 (×2): qty 1
  Filled 2020-05-02: qty 42

## 2020-05-02 MED ORDER — LORAZEPAM 1 MG PO TABS: 0.0000 mg | ORAL_TABLET | Freq: Four times a day (QID) | ORAL | Status: DC

## 2020-05-02 MED ORDER — OLANZAPINE 5 MG PO TBDP: 5.0000 mg | ORAL_TABLET | Freq: Three times a day (TID) | ORAL | Status: DC | PRN

## 2020-05-02 MED ORDER — ADULT MULTIVITAMIN W/MINERALS CH
1.0000 | ORAL_TABLET | Freq: Every day | ORAL | Status: DC
  Administered 2020-05-03: 1 via ORAL
  Filled 2020-05-02: qty 1

## 2020-05-02 MED ORDER — THIAMINE HCL 100 MG PO TABS
100.0000 mg | ORAL_TABLET | Freq: Every day | ORAL | Status: DC
  Administered 2020-05-02 – 2020-05-03 (×2): 100 mg via ORAL
  Filled 2020-05-02 (×2): qty 1

## 2020-05-02 MED ORDER — ONDANSETRON 4 MG PO TBDP: 4.0000 mg | ORAL_TABLET | Freq: Four times a day (QID) | ORAL | Status: DC | PRN

## 2020-05-02 MED ORDER — THIAMINE HCL 100 MG/ML IJ SOLN: 100.0000 mg | Freq: Once | INTRAMUSCULAR | Status: DC

## 2020-05-02 MED ORDER — LORAZEPAM 2 MG/ML IJ SOLN: 0.0000 mg | Freq: Four times a day (QID) | INTRAMUSCULAR | Status: DC

## 2020-05-02 MED ORDER — LORAZEPAM 1 MG PO TABS: 1.0000 mg | ORAL_TABLET | Freq: Once | ORAL | Status: DC

## 2020-05-02 MED ORDER — THIAMINE HCL 100 MG PO TABS: 100.0000 mg | ORAL_TABLET | Freq: Every day | ORAL | Status: DC

## 2020-05-02 MED ORDER — MIRTAZAPINE 15 MG PO TABS
15.0000 mg | ORAL_TABLET | Freq: Every day | ORAL | Status: DC
  Filled 2020-05-02: qty 14

## 2020-05-02 MED ORDER — LOPERAMIDE HCL 2 MG PO CAPS: 2.0000 mg | ORAL_CAPSULE | ORAL | Status: DC | PRN

## 2020-05-02 NOTE — ED Triage Notes (Signed)
Pt wanting help for his drug addiction problem. Pt also c/o abd pains. Pt just left earlier due to not wanting to wait to speak with TTS when they re-evaluated him this morning.

## 2020-05-02 NOTE — ED Notes (Signed)
Safe transport here to take pt to [facility]. Pt ambulating to transportation.

## 2020-05-02 NOTE — ED Notes (Signed)
Safe transport called for pt to go to [facility].

## 2020-05-02 NOTE — BHH Counselor (Addendum)
LPC contacted [place] Dept to request support with Duty to [place].  Patient has threatened harm to his step-brother's older brother, Jerene Dilling who likely resides in [place].   No other contact information provided by patient.  Limited information available provided to Communications staff with [place] Dept.   [id number]: Deputy [place] with [place] [employer] Dept called to review concerns with LPC.  He will attempt to locate Mr. Elisabeth Cara to warn of threats to harm made by patient.

## 2020-05-02 NOTE — Patient Outreach (Deleted)
ED Peer Support Specialist Patient Intake (Complete at intake & 30-60 Day Follow-up)  Name: Brian [name]  MRN: [id number]  Age: 34 y.o.   Date of Admission: 05/02/2020  Intake: Initial Comments:      Primary Reason Admitted: Addiction Problem, Abdominal Pain   Lab values: Alcohol/ETOH: Negative Positive UDS? Yes Amphetamines: No Barbiturates: No Benzodiazepines: No Cocaine: Yes Opiates: No Cannabinoids: No  Demographic information: Gender: Male Ethnicity: African American Marital Status: Unknown Insurance Status: Diplomatic Services operational officer (Work Neurosurgeon, Physicist, medical, etc.: Yes Lives with: Sibling Living situation: House/Apartment  Reported Patient History: Patient reported health conditions: None Patient aware of HIV and hepatitis status: No  In past year, has patient visited ED for any reason? No  Number of ED visits:    Reason(s) for visit:    In past year, has patient been hospitalized for any reason? No  Number of hospitalizations:    Reason(s) for hospitalization:    In past year, has patient been arrested? No  Number of arrests:    Reason(s) for arrest:    In past year, has patient been incarcerated? No  Number of incarcerations:    Reason(s) for incarceration:    In past year, has patient received medication-assisted treatment? No  In past year, patient received the following treatments:    In past year, has patient received any harm reduction services? No  Did this include any of the following?    In past year, has patient received care from a mental health provider for diagnosis other than SUD? No  In past year, is this first time patient has overdosed? No  Number of past overdoses:    In past year, is this first time patient has been hospitalized for an overdose? No  Number of hospitalizations for overdose(s):    Is patient currently receiving treatment for a mental health diagnosis?  No  Patient reports experiencing difficulty participating in SUD treatment: No    Most important reason(s) for this difficulty?    Has patient received prior services for treatment? No  In past, patient has received services from following agencies:    Plan of Care:  Suggested follow up at these agencies/treatment centers:    Other information: CPSS was able to speak with Pt an was able to discuss a few options for Pt. CPSS was made aware that Pt will be going to [facility].    [name], [place]  05/02/2020 2:46 PM

## 2020-05-02 NOTE — BH Assessment (Addendum)
Comprehensive Clinical Assessment (CCA) Screening, Triage and Referral Note  05/02/2020 Brian [name] [id number]    Patient is a 34 year old male presenting voluntarily to [facility] after leaving [place] earlier today. Patient identifies as homeless in [place]. He endorses SI with a plan to walk into traffic if he is discharged without "receiving help." Patient also endorses HI toward his step-brother's older brother in [place]. Patient reports if he sees this individual and has access to his knife, he will cut them. Social worker has completed duty to warn.   Patient endorses a history of polysubstance use. Patient reports using methamphetamines last approximately 2-3 days ago. Patient believes his meth was "laced" and endorses withdrawal symptoms he has previously experienced while taking opioids. Patient endorses 16 years of opioid use, was recently taking Suboxone for 5 months. Patient met with Peer Support Specialist earlier, who recommended patient follow up with [facility] in [place]. Patient was not agreeable to recommendation and left ED AMA.   Patient returns to ED endorsing SI and HI. Denies AHV. Denies supports. Patient reports he is seeking inpatient treatment. He is not established with a current outpatient provider.   Per Harriett Sine, NP 24 hour observation and re-assessment by psychiatry recommended.    Visit Diagnosis: No diagnosis found.     ED from 05/02/2020 in [facility] DEPT  PHQ-9 Total Score 12      Patient Reported Information How did you hear about [place]? Self   Referral name: No data recorded  Referral phone number: No data recorded Whom do you see for routine medical problems? I don't have a doctor   Practice/Facility Name: No data recorded  Practice/Facility Phone Number: No data recorded  Name of Contact: No data recorded  Contact Number: No data recorded  Contact Fax Number: No data recorded  Prescriber Name:  No data recorded  Prescriber Address (if known): No data recorded What Is the Reason for Your Visit/Call Today? Endorses situal SI. Endores plan to "walk into trafffic," if he does not receive treatment.  How Long Has This Been Causing You Problems? <Week  Have You Recently Been in Any Inpatient Treatment (Hospital/Detox/Crisis Center/28-Day Program)? No   Name/Location of Program/Hospital:No data recorded  How Long Were You There? No data recorded  When Were You Discharged? No data recorded Have You Ever Received Services From [facility] Before? Yes   Who [facility] You [facility] at [facility]? ED visits, one prior [facility] admission  Have You Recently Had Any Thoughts About Hurting Yourself? Yes (SI with plan to walk into traffic)   Are You Planning to [place] At This time?  Yes (Plan to walk into traffic if discharged)  Have you Recently Had Thoughts About [place]? Yes (Endores HI toward his step-brother's older brother.)   Explanation: Endorses HI toward step-brother's older brother, Brian [name]. Patient reports that he believes this person tainted his meth.  Have You Used Any Alcohol or Drugs in the Past 24 Hours? No (Reports last use of meth 3-4 days ago.)   How Long Ago Did You Use Drugs or Alcohol?  No data recorded  What Did You Use and How Much? No data recorded What Do You Feel Would Help You the Most Today? Other (Comment) ("I'm not sure." Requesting inpatient admission)  Do You Currently Have a Therapist/Psychiatrist? No (Received services from [facility] previously.)   Name of Therapist/Psychiatrist: No data recorded  Have You Been Recently Discharged From Any Office  Practice or Programs? No   Explanation of Discharge From Practice/Program:  No data recorded    CCA Screening Triage Referral Assessment Type of Contact: Tele-Assessment   Is this Initial or Reassessment? Initial Assessment   Date Telepsych consult ordered in [facility]:  05/02/20   Time  Telepsych consult ordered in [facility]:  1242  Patient Reported Information Reviewed? Yes   Patient Left Without Being Seen? No data recorded  Reason for Not Completing Assessment: No data recorded Collateral Involvement: Declined  Does Patient Have a [place]? No data recorded  Name and Contact of Legal Guardian:  No data recorded If Minor and Not Living with Parent(s), Who has Custody? No data recorded Is CPS involved or ever been involved? Never  Is APS involved or ever been involved? Never  Patient Determined To Be At Risk for Harm To Self or Others Based on Review of Patient Reported Information or Presenting Complaint? Yes, for Harm to Others   Method: Plan with intent and identified person   Availability of Means: Has close by   Intent: Clearly intends on inflicting harm that could cause death   Notification Required: Another person is identifiable and needs to be warned to ensure safety (DUTY TO WARN)   Additional Information for Danger to Others Potential:  No data recorded  Additional Comments for Danger to Others Potential:  Identified person is in [facility]: Brian [name].   Are There Guns or Other Weapons in [place]?  No    Types of Guns/Weapons: No data recorded   Are These Weapons Safely Secured?                              No data recorded   Who Could Verify You Are Able To Have These Secured:    No data recorded Do You Have any Outstanding Charges, Pending Court Dates, Parole/Probation? Denies  Contacted To Inform of Risk of Harm To Self or Others: Radiographer, therapeutic of Assessment: [facility] ED  Does Patient Present under Involuntary Commitment? No   IVC Papers Initial File Date: No data recorded  [place] of Residence: Other (Comment) (Left [place]. Homeless in [facility])  Patient Currently Receiving the Following Services: No data recorded  Determination of Need: Urgent (48 hours)   Options For Referral: [facility]  Urgent Care   Fransico Meadow

## 2020-05-02 NOTE — ED Notes (Signed)
Pt come out upset. Informed pt that the doctor just ordered him some medication and would he like that? Pt states that we arent helping him get any help for his drug problems and he wants to leave. This RN was informed by night RN that peer support saw patient last night trying to get him help. Made patient aware of that and he laughed saying "that guy was no peer support". Pt asking for his stuff so he can leave. Pt given his belonging bag and changed clothes and left.

## 2020-05-02 NOTE — ED Notes (Signed)
Pt upset stating that no one has seen him and he is not comfortable in that room (triage room). Pt reports that he is going to leave if he doesn't get any help. This Rn went and informed pt that he is waiting to be re-evaluated with TTS this morning. Pt wanting something for his abd pains and wants what he was given last night.  Informed pt that I would make the EDP aware for orders as there is no current medication orders at this time.

## 2020-05-02 NOTE — ED Notes (Signed)
Pt informed registration that he is now suicidal. Informed Registration that would move pt to the next available bed that comes open.

## 2020-05-02 NOTE — ED Notes (Signed)
Pt asleep in no acute distress. Will continue to monitor for safety.

## 2020-05-02 NOTE — ED Provider Notes (Addendum)
Pt has been waiting overnight for a psych evaluation.  He became upset this morning that no one was helping him with his drug problem.  He left before I could go talk to him.  He endorsed vague si/hi last night, likely related to substance abuse.  He was not under IVC.  From earlier notes and what the nurses have said, I don't think I need to get the police to go get him and put him under IVC.   Isla Pence, MD 05/02/20 1062    Isla Pence, MD 05/02/20 [phone]

## 2020-05-02 NOTE — ED Provider Notes (Signed)
[place] DEPT Provider Note   CSN: [id number] Arrival date & time: 05/02/20  4081     History Chief Complaint  Patient presents with  . Addiction Problem  . Abdominal Pain    Brian [name] is a 34 y.o. male.   Mental Health Problem Presenting symptoms: depression, homicidal ideas, paranoid behavior, suicidal thoughts and suicidal threats   Degree of incapacity (severity):  Severe Onset quality:  Gradual Timing:  Constant Progression:  Worsening Chronicity:  Chronic Context: drug abuse   Treatment compliance:  Untreated Associated symptoms: abdominal pain (chronic)   Associated symptoms: no chest pain, no fatigue and no headaches        Past Medical History:  Diagnosis Date  . Alcohol abuse   . Anxiety   . Cocaine abuse ([place])   . Depression   . Kidney stones   . Narcotic abuse ([place])   . Peptic ulcer   . Polysubstance abuse ([place])   . Renal disorder    kidney stones    Patient Active Problem List   Diagnosis Date Noted  . Amphetamine use disorder, severe, dependence ([place]) 03/26/2018  . Substance induced mood disorder ([place]) 03/26/2018  . Bipolar II disorder, severe, depressed, with anxious distress ([place]) 03/25/2018  . Cellulitis 05/20/2015  . Dysuria 05/20/2015  . Smoker 05/20/2015  . Homeless single person 05/20/2015  . Drug abuse ([place])   . Opioid use disorder, severe, dependence ([place]) 03/08/2015  . Ileus of unspecified type ([place]) 05/28/2014  . Abdominal pain 05/28/2014  . AKI (acute kidney injury) ([place]) 05/28/2014  . Esophagitis, acute 05/28/2014  . Polysubstance abuse ([place]) 05/28/2014  . Hematemesis 05/28/2014    Past Surgical History:  Procedure Laterality Date  . ESOPHAGOGASTRODUODENOSCOPY N/A 05/29/2014   Procedure: ESOPHAGOGASTRODUODENOSCOPY (EGD) ;  Surgeon: Beryle Beams, MD;  Location: [facility] ENDOSCOPY;  Service: Endoscopy;  Laterality: N/A;  check with Dr. Collene Mares about sedation type/timinng - I recommend MAC  .  HAND SURGERY  2006       No family history on file.  Social History   Tobacco Use  . Smoking status: Current Every Day Smoker    Packs/day: 0.50    Types: Cigarettes  . Smokeless tobacco: Never Used  Vaping Use  . Vaping Use: Never used  Substance Use Topics  . Alcohol use: Not Currently  . Drug use: Not Currently    Types: Marijuana, Cocaine, IV    Home Medications Prior to Admission medications   Medication Sig Start Date End Date Taking? Authorizing Provider  famotidine (PEPCID) 20 MG tablet Take 1 tablet (20 mg total) by mouth 2 (two) times daily. Patient not taking: Reported on 11/04/2019 08/30/19   Lorayne Bender, PA-C  FLUoxetine (PROZAC) 20 MG capsule Take 1 capsule (20 mg total) by mouth daily. For depression Patient not taking: Reported on 11/04/2019 04/01/18   Lindell Spar I, NP  gabapentin (NEURONTIN) 300 MG capsule Take 2 capsules (600 mg total) by mouth 3 (three) times daily. For agitation Patient not taking: Reported on 05/02/2020 03/31/18   Lindell Spar I, NP  mirtazapine (REMERON) 7.5 MG tablet Take 1 tablet (7.5 mg total) by mouth at bedtime. For sleep Patient not taking: Reported on 11/04/2019 03/31/18   Lindell Spar I, NP  ondansetron (ZOFRAN ODT) 4 MG disintegrating tablet 19m ODT q4 hours prn nausea/vomit Patient not taking: Reported on 05/02/2020 11/04/19   BMalvin Johns MD  sucralfate (CARAFATE) 1 g tablet Take 1 tablet (1 g total) by mouth  4 (four) times daily -  with meals and at bedtime. Patient not taking: Reported on 11/04/2019 08/30/19   Lorayne Bender, PA-C  traZODone (DESYREL) 50 MG tablet Take 1 tablet (50 mg total) by mouth at bedtime as needed for sleep. Patient not taking: Reported on 08/30/2019 03/31/18 08/30/19  Lindell Spar I, NP    Allergies    Fish allergy and Shellfish allergy  Review of Systems   Review of Systems  Constitutional: Positive for chills. Negative for fatigue and fever.  HENT: Negative for congestion and rhinorrhea.   Respiratory: Negative  for cough and shortness of breath.   Cardiovascular: Negative for chest pain and palpitations.  Gastrointestinal: Positive for abdominal pain (chronic), diarrhea and nausea. Negative for vomiting.  Genitourinary: Negative for dysuria and hematuria.  Musculoskeletal: Negative for arthralgias and back pain.  Skin: Negative for color change and rash.  Neurological: Negative for syncope and headaches.  Psychiatric/Behavioral: Positive for dysphoric mood, homicidal ideas, paranoia, sleep disturbance and suicidal ideas.  All other systems reviewed and are negative.   Physical Exam Updated Vital Signs BP 137/72   Pulse (!) 108   Temp 98 F (36.7 C) (Oral)   Resp 16   SpO2 99%   Physical Exam Vitals and nursing note reviewed.  Constitutional:      General: He is not in acute distress.    Appearance: Normal appearance.  HENT:     Head: Normocephalic and atraumatic.     Nose: No rhinorrhea.  Eyes:     General:        Right eye: No discharge.        Left eye: No discharge.     Conjunctiva/sclera: Conjunctivae normal.  Cardiovascular:     Rate and Rhythm: Normal rate and regular rhythm.  Pulmonary:     Effort: Pulmonary effort is normal.     Breath sounds: No stridor.  Abdominal:     General: Abdomen is flat. There is no distension.     Palpations: Abdomen is soft.  Musculoskeletal:        General: No deformity or signs of injury.  Skin:    General: Skin is warm and dry.  Neurological:     General: No focal deficit present.     Mental Status: He is alert. Mental status is at baseline.     Motor: No weakness.  Psychiatric:        Mood and Affect: Mood is anxious.        Speech: Speech normal.        Behavior: Behavior normal.        Thought Content: Thought content is paranoid. Thought content includes homicidal and suicidal ideation.     ED Results / Procedures / Treatments   Labs (all labs ordered are listed, but only abnormal results are displayed) Labs Reviewed    COMPREHENSIVE METABOLIC PANEL  ETHANOL  RAPID URINE DRUG SCREEN, HOSP PERFORMED  CBC WITH DIFFERENTIAL/PLATELET  SALICYLATE LEVEL  ACETAMINOPHEN LEVEL    EKG None  Radiology No results found.  Procedures Procedures (including critical care time)  Medications Ordered in ED Medications  chlorproMAZINE (THORAZINE) tablet 50 mg (has no administration in time range)    ED Course  I have reviewed the triage vital signs and the nursing notes.  Pertinent labs & imaging results that were available during my care of the patient were reviewed by me and considered in my medical decision making (see chart for details).    MDM Rules/Calculators/A&P  Patient with concerning history of substance abuse and mental health disorder comes today with suicidal thoughts homicidal thoughts and paranoia.  He was checked and seen for the same earlier but left prior to completion of his evaluation, has returned, stating he is apologetic and should not have left.  He is advised that he can stay for reevaluation however he believes this time he will no longer be welcome back for inpatient treatment plan.  He may just need psychiatric clearance as an outpatient placement and substance abuse programs.  Medical clearance laboratory studies will be performed and he will be observed here and seen by behavioral health.  The patient has been calm and cooperative, but is been sent, no significant abnormalities, no signs of intoxication otherwise.  He is medically cleared and ready for his behavioral health evaluation, possibly needing placement for substance abuse and/or mental health disorders.  Pt care was handed off to on coming provider at 1530.  Complete history and physical and current plan have been communicated.  Please refer to their note for the remainder of ED care and ultimate disposition.    Final Clinical Impression(s) / ED Diagnoses Final diagnoses:  None    Rx / DC  Orders ED Discharge Orders    None       Breck Coons, MD 05/02/20 [phone]

## 2020-05-02 NOTE — ED Notes (Signed)
This RN tried x2 to obtain blood with straight stick, pt moving around. Pt asked to be still if possible, pt unable to. Will continue to try to obtain ordered labs.

## 2020-05-02 NOTE — ED Notes (Signed)
Phlebotomy called for pt labs.

## 2020-05-02 NOTE — ED Notes (Addendum)
Pt admitted to continuous observation. Pt alert, oriented, and ambulatory with no issues. Pt was irritable and verbally aggressive on the unit. Pt given sandwich and soda on request. Pt denies SI/HI, A/VH, and pain. Education, support and encouragement provided. Medications administered per MD orders. No reactions/side effects to medicine noted. Pt denies any concerns at this time, and verbally contracts for safety. Pt remains safe on the unit.

## 2020-05-02 NOTE — ED Provider Notes (Addendum)
Behavioral Health Admission H&P [facility] & OBS)  Date: 05/02/20 Patient Name: Brian [name] MRN: [id number] Chief Complaint: No chief complaint on file.     Diagnoses:  Final diagnoses:  None    HPI: Per TTS assessment this afternoon: Patient is a 34 year old male presenting voluntarily to [facility] after leaving AMA earlier today. Patient identifies as homeless in [place]. He endorses SI with a plan to walk into traffic if he is discharged without "receiving help." Patient also endorses HI toward his step-brother's older brother in [place]. Patient reports if he sees this individual and has access to his knife, he will cut them. Social worker has completed duty to warn. Patient endorses a history of polysubstance use. Patient reports using methamphetamines last approximately 2-3 days ago. Patient believes his meth was "laced" and endorses withdrawal symptoms he has previously experienced while taking opioids. Patient endorses 16 years of opioid use, was recently taking Suboxone for 5 months. Patient met with Peer Support Specialist earlier, who recommended patient follow up with [facility] in [place]. Patient was not agreeable to recommendation and left ED AMA. Patient returns to ED endorsing SI and HI. Denies AHV. Denies supports. Patient reports he is seeking inpatient treatment. He is not established with a current outpatient provider.   Patient with history of methamphetamine use as described above. He is irritable on assessment. He is endorsing SI to walk into traffic as well as HI with no plan toward his stepbrother. He denies AVH and shows no signs of responding to internal stimuli. No delusional or paranoid thought content expressed. He is requesting help with substance use and unable to contract for safety to discharge. He is seen at [facility] and states he is prescribed Zyprexa 10 mg QHS, Remeron 30 mg QHS, and Neurontin 300 mg TID but has been off medications for several  weeks. He admits to regular methamphetamine use but believes he had some that was "laced" with other substances. UDS is positive for BZDs and THC as well as amphetamines.  PHQ 2-9:    ED from 05/02/2020 in [facility] DEPT  Thoughts that you would be better off dead, or of hurting yourself in some way More than half the days  PHQ-9 Total Score 12        Admission (Discharged) from 03/25/2018 in [facility] ED from 03/24/2018 in [facility] High Risk High Risk       Total Time spent with patient: 30 minutes  Musculoskeletal  Strength & Muscle Tone: within normal limits Gait & Station: normal Patient leans: N/A  Psychiatric Specialty Exam  Presentation General Appearance: Casual  Eye Contact:Fair  Speech:Normal Rate  Speech Volume:Increased  Handedness:Right   Mood and Affect  Mood:Irritable  Affect:Congruent   Thought Process  Thought Processes:Coherent  Descriptions of Associations:Intact  Orientation:Full (Time, Place and Person)  Thought Content:Logical  Hallucinations:Hallucinations: None  Ideas of Reference:None  Suicidal Thoughts:Suicidal Thoughts: Yes, Active SI Active Intent and/or Plan: With Plan  Homicidal Thoughts:Homicidal Thoughts: Yes, Passive HI Passive Intent and/or Plan: Without Plan   Sensorium  Memory:Immediate Good;Recent Good;Remote Good  Judgment:Poor  Insight:Lacking   Executive Functions  Concentration:Fair  Attention Span:Fair  [place]   Psychomotor Activity  Psychomotor Activity:Psychomotor Activity: Normal   Assets  Assets:Communication Skills;Desire for Improvement;Resilience   Sleep  Sleep:Sleep: Fair   Physical Exam Vitals and nursing note reviewed.  Constitutional:      Appearance: He is well-developed.  Cardiovascular:     Rate and  Rhythm: Normal rate.  Pulmonary:     Effort: Pulmonary effort is normal.  Neurological:     Mental Status: He is alert and oriented to person, place, and time.    Review of Systems  Constitutional: Negative.   Respiratory: Negative for cough and shortness of breath.   Neurological: Negative for tremors, seizures and weakness.  Psychiatric/Behavioral: Positive for depression, substance abuse and suicidal ideas. Negative for hallucinations and memory loss. The patient is nervous/anxious and has insomnia.     There were no vitals taken for this visit. There is no height or weight on file to calculate BMI.  Past Psychiatric History: History of polysubstance use with multiple hospitalizations. Most recently hospitalized at [facility] in December 2020 for detox and discharged on Remeron 30 mg PO QHS, Zyprexa 10 mg PO QHS, and Neurontin 300 mg PO QHS.    Is the patient at risk to self? Yes  Has the patient been a risk to self in the past 6 months? No .    Has the patient been a risk to self within the distant past? Yes   Is the patient a risk to others? No   Has the patient been a risk to others in the past 6 months? No   Has the patient been a risk to others within the distant past? No   Past Medical History:  Past Medical History:  Diagnosis Date  . Alcohol abuse   . Anxiety   . Cocaine abuse ([place])   . Depression   . Kidney stones   . Narcotic abuse ([place])   . Peptic ulcer   . Polysubstance abuse ([place])   . Renal disorder    kidney stones    Past Surgical History:  Procedure Laterality Date  . ESOPHAGOGASTRODUODENOSCOPY N/A 05/29/2014   Procedure: ESOPHAGOGASTRODUODENOSCOPY (EGD) ;  Surgeon: Beryle Beams, MD;  Location[facility] ENDOSCOPY;  Service: Endoscopy;  Laterality: N/A;  check with Dr. Collene Mares about sedation type/timinng - I recommend MAC  . HAND SURGERY  2006    Family History: No family history on file.  Social History:  Social History   Socioeconomic History  . Marital  status: Single    Spouse name: Not on file  . Number of children: Not on file  . Years of education: Not on file  . Highest education level: Not on file  Occupational History  . Not on file  Tobacco Use  . Smoking status: Current Every Day Smoker    Packs/day: 0.50    Types: Cigarettes  . Smokeless tobacco: Never Used  Vaping Use  . Vaping Use: Never used  Substance and Sexual Activity  . Alcohol use: Not Currently  . Drug use: Not Currently    Types: Marijuana, Cocaine, IV  . Sexual activity: Not on file  Other Topics Concern  . Not on file  Social History Narrative   Lives with a friend   Lost house and custody of son 9broke up with fiancee)   Nature conservation officer   Social Determinants of Radio broadcast assistant Strain:   . Difficulty of Paying Living Expenses:   Food Insecurity:   . Worried About Charity fundraiser in the Last Year:   . Arboriculturist in the Last Year:   Transportation Needs:   . Film/video editor (Medical):   Marland Kitchen Lack of Transportation (Non-Medical):  Physical Activity:   . Days of Exercise per Week:   . Minutes of Exercise per Session:   Stress:   . Feeling of Stress :   Social Connections:   . Frequency of Communication with Friends and Family:   . Frequency of Social Gatherings with Friends and Family:   . Attends Religious Services:   . Active Member of Clubs or Organizations:   . Attends Archivist Meetings:   Marland Kitchen Marital Status:   Intimate Partner Violence:   . Fear of Current or Ex-Partner:   . Emotionally Abused:   Marland Kitchen Physically Abused:   . Sexually Abused:     SDOH:  SDOH Screenings   Alcohol Screen:   . Last Alcohol Screening Score (AUDIT):   Depression (PHQ2-9): Medium Risk  . PHQ-2 Score: 12  Financial Resource Strain:   . Difficulty of Paying Living Expenses:   Food Insecurity:   . Worried About Charity fundraiser in the Last Year:   . [place] in the Last Year:   Housing:   . Last Housing  Risk Score:   Physical Activity:   . Days of Exercise per Week:   . Minutes of Exercise per Session:   Social Connections:   . Frequency of Communication with Friends and Family:   . Frequency of Social Gatherings with Friends and Family:   . Attends Religious Services:   . Active Member of Clubs or Organizations:   . Attends Archivist Meetings:   Marland Kitchen Marital Status:   Stress:   . Feeling of Stress :   Tobacco Use: High Risk  . Smoking Tobacco Use: Current Every Day Smoker  . Smokeless Tobacco Use: Never Used  Transportation Needs:   . Film/video editor (Medical):   Marland Kitchen Lack of Transportation (Non-Medical):     Last Labs:  Admission on 05/02/2020, Discharged on 05/02/2020  Component Date Value Ref Range Status  . Sodium 05/02/2020 139  135 - 145 mmol/L Final  . Potassium 05/02/2020 4.4  3.5 - 5.1 mmol/L Final  . Chloride 05/02/2020 103  98 - 111 mmol/L Final  . CO2 05/02/2020 26  22 - 32 mmol/L Final  . Glucose, Bld 05/02/2020 96  70 - 99 mg/dL Final   Glucose reference range applies only to samples taken after fasting for at least 8 hours.  . BUN 05/02/2020 13  6 - 20 mg/dL Final  . Creatinine, Ser 05/02/2020 1.03  0.61 - 1.24 mg/dL Final  . Calcium 05/02/2020 9.3  8.9 - 10.3 mg/dL Final  . Total Protein 05/02/2020 7.8  6.5 - 8.1 g/dL Final  . Albumin 05/02/2020 4.3  3.5 - 5.0 g/dL Final  . AST 05/02/2020 20  15 - 41 U/L Final  . ALT 05/02/2020 33  0 - 44 U/L Final  . Alkaline Phosphatase 05/02/2020 81  38 - 126 U/L Final  . Total Bilirubin 05/02/2020 0.6  0.3 - 1.2 mg/dL Final  . GFR calc non Af Amer 05/02/2020 >60  >60 mL/min Final  . GFR calc Af Amer 05/02/2020 >60  >60 mL/min Final  . Anion gap 05/02/2020 10  5 - 15 Final   Performed at [facility], [place] [address], [place], [place] [address]  . Alcohol, Ethyl (B) 05/02/2020 <10  <10 mg/dL Final   Comment: (NOTE) Lowest detectable limit for serum alcohol is 10 mg/dL.  For medical  purposes only. Performed at [facility], [place] [address]  [place], [place] [address]   . WBC 05/02/2020 8.9  4.0 - 10.5 K/uL Final  . RBC 05/02/2020 4.83  4.22 - 5.81 MIL/uL Final  . Hemoglobin 05/02/2020 14.7  13.0 - 17.0 g/dL Final  . HCT 05/02/2020 45.0  39 - 52 % Final  . MCV 05/02/2020 93.2  80.0 - 100.0 fL Final  . MCH 05/02/2020 30.4  26.0 - 34.0 pg Final  . MCHC 05/02/2020 32.7  30.0 - 36.0 g/dL Final  . RDW 05/02/2020 13.3  11.5 - 15.5 % Final  . Platelets 05/02/2020 263  150 - 400 K/uL Final  . nRBC 05/02/2020 0.0  0.0 - 0.2 % Final  . Neutrophils Relative % 05/02/2020 71  % Final  . Neutro Abs 05/02/2020 6.2  1.7 - 7.7 K/uL Final  . Lymphocytes Relative 05/02/2020 21  % Final  . Lymphs Abs 05/02/2020 1.9  0.7 - 4.0 K/uL Final  . Monocytes Relative 05/02/2020 7  % Final  . Monocytes Absolute 05/02/2020 0.7  0 - 1 K/uL Final  . Eosinophils Relative 05/02/2020 1  % Final  . Eosinophils Absolute 05/02/2020 0.1  0 - 0 K/uL Final  . Basophils Relative 05/02/2020 0  % Final  . Basophils Absolute 05/02/2020 0.0  0 - 0 K/uL Final  . Immature Granulocytes 05/02/2020 0  % Final  . Abs Immature Granulocytes 05/02/2020 0.02  0.00 - 0.07 K/uL Final   Performed at [facility], [place] [address] [place], [place] [address]  . Salicylate Lvl 35/00/9381 <7.0* 7.0 - 30.0 mg/dL Final   Performed at [facility] [address] [place], [place] [address]  . Acetaminophen (Tylenol), Serum 05/02/2020 <10* 10 - 30 ug/mL Final   Comment: (NOTE) Therapeutic concentrations vary significantly. A range of 10-30 ug/mL  may be an effective concentration for many patients. However, some  are best treated at concentrations outside of this range. Acetaminophen concentrations >150 ug/mL at 4 hours after ingestion  and >50 ug/mL at 12 hours after ingestion are often associated with  toxic reactions.  Performed at [facility],  [place] [address] [place], [place] [address]   . SARS Coronavirus 2 05/02/2020 NEGATIVE  NEGATIVE Final   Comment: (NOTE) SARS-CoV-2 target nucleic acids are NOT DETECTED.  The SARS-CoV-2 RNA is generally detectable in upper and lower respiratory specimens during the acute phase of infection. The lowest concentration of SARS-CoV-2 viral copies this assay can detect is 250 copies / mL. A negative result does not preclude SARS-CoV-2 infection and should not be used as the sole basis for treatment or other patient management decisions.  A negative result may occur with improper specimen collection / handling, submission of specimen other than nasopharyngeal swab, presence of viral mutation(s) within the areas targeted by this assay, and inadequate number of viral copies (<250 copies / mL). A negative result must be combined with clinical observations, patient history, and epidemiological information.  Fact Sheet for Patients:   StrictlyIdeas.no  Fact Sheet for Healthcare Providers: BankingDealers.co.za  This test is not yet approved or                           cleared by the [place] FDA and has been authorized for detection and/or diagnosis of SARS-CoV-2 by FDA under an Emergency Use Authorization (EUA).  This EUA will remain in effect (meaning this test can be used) for the duration of the COVID-19 declaration under Section 564(b)(1)  of the Act, 21 U.S.C. section 360bbb-3(b)(1), unless the authorization is terminated or revoked sooner.  Performed at [facility], [place] [address], [place], [place] [address]   Admission on 05/01/2020, Discharged on 05/02/2020  Component Date Value Ref Range Status  . Sodium 05/01/2020 142  135 - 145 mmol/L Final  . Potassium 05/01/2020 4.4  3.5 - 5.1 mmol/L Final  . Chloride 05/01/2020 107  98 - 111 mmol/L Final  . CO2 05/01/2020 29  22 - 32 mmol/L Final  . Glucose, Bld  05/01/2020 81  70 - 99 mg/dL Final   Glucose reference range applies only to samples taken after fasting for at least 8 hours.  . BUN 05/01/2020 18  6 - 20 mg/dL Final  . Creatinine, Ser 05/01/2020 1.21  0.61 - 1.24 mg/dL Final  . Calcium 05/01/2020 9.0  8.9 - 10.3 mg/dL Final  . Total Protein 05/01/2020 8.1  6.5 - 8.1 g/dL Final  . Albumin 05/01/2020 4.5  3.5 - 5.0 g/dL Final  . AST 05/01/2020 20  15 - 41 U/L Final  . ALT 05/01/2020 33  0 - 44 U/L Final  . Alkaline Phosphatase 05/01/2020 84  38 - 126 U/L Final  . Total Bilirubin 05/01/2020 0.5  0.3 - 1.2 mg/dL Final  . GFR calc non Af Amer 05/01/2020 >60  >60 mL/min Final  . GFR calc Af Amer 05/01/2020 >60  >60 mL/min Final  . Anion gap 05/01/2020 6  5 - 15 Final   Performed at [facility], [place] [address], [place], [place] [address]  . Alcohol, Ethyl (B) 05/01/2020 <10  <10 mg/dL Final   Comment: (NOTE) Lowest detectable limit for serum alcohol is 10 mg/dL.  For medical purposes only. Performed at [facility], [place] [address], [place], [place] [address]   . Salicylate Lvl 02/77/4128 <7.0* 7.0 - 30.0 mg/dL Final   Performed at [place] [address], [place], [place] [address]  . Acetaminophen (Tylenol), Serum 05/01/2020 <10* 10 - 30 ug/mL Final   Comment: (NOTE) Therapeutic concentrations vary significantly. A range of 10-30 ug/mL  may be an effective concentration for many patients. However, some  are best treated at concentrations outside of this range. Acetaminophen concentrations >150 ug/mL at 4 hours after ingestion  and >50 ug/mL at 12 hours after ingestion are often associated with  toxic reactions.  Performed at [facility], [place] [address], [place], [place] [address]   . WBC 05/01/2020 9.9  4.0 - 10.5 K/uL Final  . RBC 05/01/2020 4.75  4.22 - 5.81 MIL/uL Final  . Hemoglobin 05/01/2020 14.4  13.0 - 17.0 g/dL Final  . HCT 05/01/2020 44.5   39 - 52 % Final  . MCV 05/01/2020 93.7  80.0 - 100.0 fL Final  . MCH 05/01/2020 30.3  26.0 - 34.0 pg Final  . MCHC 05/01/2020 32.4  30.0 - 36.0 g/dL Final  . RDW 05/01/2020 13.4  11.5 - 15.5 % Final  . Platelets 05/01/2020 293  150 - 400 K/uL Final  . nRBC 05/01/2020 0.0  0.0 - 0.2 % Final   Performed at [facility], [place] [address] [place], [place] [address]  . Opiates 05/01/2020 NONE DETECTED  NONE DETECTED Final  . Cocaine 05/01/2020 NONE DETECTED  NONE DETECTED Final  . Benzodiazepines 05/01/2020 POSITIVE* NONE DETECTED Final  . Amphetamines 05/01/2020 POSITIVE* NONE DETECTED Final  . Tetrahydrocannabinol 05/01/2020 POSITIVE* NONE DETECTED Final  . Barbiturates  05/01/2020 NONE DETECTED  NONE DETECTED Final   Comment: (NOTE) DRUG SCREEN FOR MEDICAL PURPOSES ONLY.  IF CONFIRMATION IS NEEDED FOR ANY PURPOSE, NOTIFY LAB WITHIN 5 DAYS.  LOWEST DETECTABLE LIMITS FOR URINE DRUG SCREEN Drug Class                     Cutoff (ng/mL) Amphetamine and metabolites    1000 Barbiturate and metabolites    200 Benzodiazepine                 229 Tricyclics and metabolites     300 Opiates and metabolites        300 Cocaine and metabolites        300 THC                            50 Performed at [facility], [place] [address] [place], [place] [address]   . SARS Coronavirus 2 05/01/2020 NEGATIVE  NEGATIVE Final   Comment: (NOTE) SARS-CoV-2 target nucleic acids are NOT DETECTED.  The SARS-CoV-2 RNA is generally detectable in upper and lower respiratory specimens during the acute phase of infection. The lowest concentration of SARS-CoV-2 viral copies this assay can detect is 250 copies / mL. A negative result does not preclude SARS-CoV-2 infection and should not be used as the sole basis for treatment or other patient management decisions.  A negative result may occur with improper specimen collection / handling, submission of specimen other than  nasopharyngeal swab, presence of viral mutation(s) within the areas targeted by this assay, and inadequate number of viral copies (<250 copies / mL). A negative result must be combined with clinical observations, patient history, and epidemiological information.  Fact Sheet for Patients:   StrictlyIdeas.no  Fact Sheet for Healthcare Providers: BankingDealers.co.za  This test is not yet approved or                           cleared by the [place] FDA and has been authorized for detection and/or diagnosis of SARS-CoV-2 by FDA under an Emergency Use Authorization (EUA).  This EUA will remain in effect (meaning this test can be used) for the duration of the COVID-19 declaration under Section 564(b)(1) of the Act, 21 U.S.C. section 360bbb-3(b)(1), unless the authorization is terminated or revoked sooner.  Performed at [facility], [place] [address] [place], [place] [address]   . HIV Screen 4th Generation wRfx 05/01/2020 Non Reactive  Non Reactive Final   Performed at [facility], [place] [address], [place], [place] [address]  . RPR Ser Ql 05/01/2020 NON REACTIVE  NON REACTIVE Final   Performed at [facility], [place] [address], [place], [place] [address]  . Total CK 05/01/2020 88  49.0 - 397.0 U/L Final   Performed at [facility], [place] [address] [place], [place] [address]  Admission on 11/04/2019, Discharged on 11/04/2019  Component Date Value Ref Range Status  . Lipase 11/04/2019 22  11 - 51 U/L Final   Performed at [facility], [place] [address] [place], [place] [address]  . Sodium 11/04/2019 140  135 - 145 mmol/L Final  . Potassium 11/04/2019 3.7  3.5 - 5.1 mmol/L Final  . Chloride 11/04/2019 97* 98 - 111 mmol/L Final  . CO2 11/04/2019 26  22 - 32 mmol/L Final  . Glucose, Bld 11/04/2019 115*  70 - 99 mg/dL Final  . BUN 11/04/2019 25* 6 - 20 mg/dL Final  . Creatinine, Ser  11/04/2019 1.47* 0.61 - 1.24 mg/dL Final  . Calcium 11/04/2019 10.4* 8.9 - 10.3 mg/dL Final  . Total Protein 11/04/2019 9.2* 6.5 - 8.1 g/dL Final  . Albumin 11/04/2019 5.1* 3.5 - 5.0 g/dL Final  . AST 11/04/2019 32  15 - 41 U/L Final  . ALT 11/04/2019 32  0 - 44 U/L Final  . Alkaline Phosphatase 11/04/2019 86  38 - 126 U/L Final  . Total Bilirubin 11/04/2019 0.9  0.3 - 1.2 mg/dL Final  . GFR calc non Af Amer 11/04/2019 >60  >60 mL/min Final  . GFR calc Af Amer 11/04/2019 >60  >60 mL/min Final  . Anion gap 11/04/2019 17* 5 - 15 Final   Performed at [facility], [place] [address] [place], [place] [address]  . WBC 11/04/2019 12.2* 4.0 - 10.5 K/uL Final  . RBC 11/04/2019 5.52  4.22 - 5.81 MIL/uL Final  . Hemoglobin 11/04/2019 16.8  13.0 - 17.0 g/dL Final  . HCT 11/04/2019 50.0  39 - 52 % Final  . MCV 11/04/2019 90.6  80.0 - 100.0 fL Final  . MCH 11/04/2019 30.4  26.0 - 34.0 pg Final  . MCHC 11/04/2019 33.6  30.0 - 36.0 g/dL Final  . RDW 11/04/2019 14.5  11.5 - 15.5 % Final  . Platelets 11/04/2019 314  150 - 400 K/uL Final  . nRBC 11/04/2019 0.0  0.0 - 0.2 % Final   Performed at [facility], [place] [address] [place], [place] [address]  . Color, Urine 11/04/2019 YELLOW  YELLOW Final  . APPearance 11/04/2019 CLEAR  CLEAR Final  . Specific Gravity, Urine 11/04/2019 1.028  1.005 - 1.030 Final  . pH 11/04/2019 6.0  5.0 - 8.0 Final  . Glucose, UA 11/04/2019 NEGATIVE  NEGATIVE mg/dL Final  . Hgb urine dipstick 11/04/2019 NEGATIVE  NEGATIVE Final  . Bilirubin Urine 11/04/2019 NEGATIVE  NEGATIVE Final  . Ketones, ur 11/04/2019 20* NEGATIVE mg/dL Final  . Protein, ur 11/04/2019 100* NEGATIVE mg/dL Final  . Nitrite 11/04/2019 NEGATIVE  NEGATIVE Final  . [place] Guest 11/04/2019 NEGATIVE  NEGATIVE Final  . RBC / HPF 11/04/2019 0-5  0 - 5 RBC/hpf Final  . WBC, UA 11/04/2019 0-5  0 - 5 WBC/hpf Final  . Bacteria, UA 11/04/2019 RARE* NONE SEEN Final  . Mucus 11/04/2019 PRESENT   Final    Performed at [facility], [place] [address], [place], [place] [address]  . SARS Coronavirus 2 Ag 11/04/2019 NEGATIVE  NEGATIVE Final   Comment: (NOTE) SARS-CoV-2 antigen NOT DETECTED.  Negative results are presumptive.  Negative results do not preclude SARS-CoV-2 infection and should not be used as the sole basis for treatment or other patient management decisions, including infection  control decisions, particularly in the presence of clinical signs and  symptoms consistent with COVID-19, or in those who have been in contact with the virus.  Negative results must be combined with clinical observations, patient history, and epidemiological information. The expected result is Negative. Fact Sheet for Patients: PodPark.tn Fact Sheet for Healthcare Providers: GiftContent.is This test is not yet approved or cleared by the [place] FDA and  has been authorized for detection and/or diagnosis of SARS-CoV-2 by FDA under an Emergency Use Authorization (EUA).  This EUA will remain in effect (meaning this test can be used) for the duration of  the COVID-19 de  claration under Section 564(b)(1) of the Act, 21 U.S.C. section 360bbb-3(b)(1), unless the authorization is terminated or revoked sooner.   Marland Kitchen SARS Coronavirus 2 by RT PCR 11/04/2019 NEGATIVE  NEGATIVE Final   Comment: (NOTE) SARS-CoV-2 target nucleic acids are NOT DETECTED. The SARS-CoV-2 RNA is generally detectable in upper respiratoy specimens during the acute phase of infection. The lowest concentration of SARS-CoV-2 viral copies this assay can detect is 131 copies/mL. A negative result does not preclude SARS-Cov-2 infection and should not be used as the sole basis for treatment or other patient management decisions. A negative result may occur with  improper specimen collection/handling, submission of specimen other than nasopharyngeal swab,  presence of viral mutation(s) within the areas targeted by this assay, and inadequate number of viral copies (<131 copies/mL). A negative result must be combined with clinical observations, patient history, and epidemiological information. The expected result is Negative. Fact Sheet for Patients:  PinkCheek.be Fact Sheet for Healthcare Providers:  GravelBags.it This test is not yet ap                          proved or cleared by the [place] FDA and  has been authorized for detection and/or diagnosis of SARS-CoV-2 by FDA under an Emergency Use Authorization (EUA). This EUA will remain  in effect (meaning this test can be used) for the duration of the COVID-19 declaration under Section 564(b)(1) of the Act, 21 U.S.C. section 360bbb-3(b)(1), unless the authorization is terminated or revoked sooner.   . Influenza A by PCR 11/04/2019 NEGATIVE  NEGATIVE Final  . Influenza B by PCR 11/04/2019 NEGATIVE  NEGATIVE Final   Comment: (NOTE) The Xpert Xpress SARS-CoV-2/FLU/RSV assay is intended as an aid in  the diagnosis of influenza from Nasopharyngeal swab specimens and  should not be used as a sole basis for treatment. Nasal washings and  aspirates are unacceptable for Xpert Xpress SARS-CoV-2/FLU/RSV  testing. Fact Sheet for Patients: PinkCheek.be Fact Sheet for Healthcare Providers: GravelBags.it This test is not yet approved or cleared by the [place] FDA and  has been authorized for detection and/or diagnosis of SARS-CoV-2 by  FDA under an Emergency Use Authorization (EUA). This EUA will remain  in effect (meaning this test can be used) for the duration of the  Covid-19 declaration under Section 564(b)(1) of the Act, 21  U.S.C. section 360bbb-3(b)(1), unless the authorization is  terminated or revoked. Performed at [facility], [place] [address],  [place], [place] [address]   . Opiates 11/04/2019 NONE DETECTED  NONE DETECTED Final  . Cocaine 11/04/2019 NONE DETECTED  NONE DETECTED Final  . Benzodiazepines 11/04/2019 NONE DETECTED  NONE DETECTED Final  . Amphetamines 11/04/2019 POSITIVE* NONE DETECTED Final  . Tetrahydrocannabinol 11/04/2019 POSITIVE* NONE DETECTED Final  . Barbiturates 11/04/2019 NONE DETECTED  NONE DETECTED Final   Comment: (NOTE) DRUG SCREEN FOR MEDICAL PURPOSES ONLY.  IF CONFIRMATION IS NEEDED FOR ANY PURPOSE, NOTIFY LAB WITHIN 5 DAYS. LOWEST DETECTABLE LIMITS FOR URINE DRUG SCREEN Drug Class                     Cutoff (ng/mL) Amphetamine and metabolites    1000 Barbiturate and metabolites    200 Benzodiazepine                 149 Tricyclics and metabolites     300 Opiates and metabolites        300 Cocaine and metabolites  300 THC                            50 Performed at [facility], [place] [address] [place], [place] [address]     Allergies: Fish allergy and Shellfish allergy  PTA Medications: (Not in a hospital admission)   Medical Decision Making  Overnight observation.  Start Neurontin 300 mg PO TID for anxiety/mood Start Vistaril 25 mg PO Q6HR PRN anxiety Start Ativan 1 mg PO Q6HR PRN CIWA>10 for BZD withdrawal Start Remeron 15 mg PO QHS for mood/depression Start Zyprexa 10 mg PO QHS for mood Start trazodone 50 mg PO QHS PRN insomnia Agitation protocol PRN agitation    Recommendations  Based on my evaluation the patient does not appear to have an emergency medical condition.  Connye Burkitt, NP 05/02/20  4:30 PM

## 2020-05-03 ENCOUNTER — Emergency Department (HOSPITAL_COMMUNITY)
Admission: EM | Admit: 2020-05-03 | Discharge: 2020-05-04 | Disposition: A | Discharge status: Home / Self Care | Payer: Self-pay | Attending: Emergency Medicine | Admitting: Emergency Medicine

## 2020-05-03 ENCOUNTER — Encounter (HOSPITAL_COMMUNITY): Payer: Self-pay | Admitting: Emergency Medicine

## 2020-05-03 ENCOUNTER — Other Ambulatory Visit: Payer: Self-pay

## 2020-05-03 DIAGNOSIS — F17210 Nicotine dependence, cigarettes, uncomplicated: Secondary | ICD-10-CM | POA: Insufficient documentation

## 2020-05-03 DIAGNOSIS — F1910 Other psychoactive substance abuse, uncomplicated: Secondary | ICD-10-CM | POA: Insufficient documentation

## 2020-05-03 DIAGNOSIS — Z590 Homelessness: Secondary | ICD-10-CM | POA: Insufficient documentation

## 2020-05-03 LAB — COMPREHENSIVE METABOLIC PANEL
ALT: 28 U/L (ref 0–44)
AST: 18 U/L (ref 15–41)
Albumin: 3.9 g/dL (ref 3.5–5.0)
Alkaline Phosphatase: 76 U/L (ref 38–126)
Anion gap: 10 (ref 5–15)
BUN: 15 mg/dL (ref 6–20)
CO2: 23 mmol/L (ref 22–32)
Calcium: 9.4 mg/dL (ref 8.9–10.3)
Chloride: 103 mmol/L (ref 98–111)
Creatinine, Ser: 1.04 mg/dL (ref 0.61–1.24)
GFR calc Af Amer: >60 mL/min (ref >60)
GFR calc non Af Amer: >60 mL/min (ref >60)
Glucose, Bld: 101 mg/dL — ABNORMAL HIGH (ref 70–99)
Potassium: 4.3 mmol/L (ref 3.5–5.1)
Sodium: 136 mmol/L (ref 135–145)
Total Bilirubin: 0.5 mg/dL (ref 0.3–1.2)
Total Protein: 7.3 g/dL (ref 6.5–8.1)

## 2020-05-03 LAB — CBC
HCT: 45 % (ref 39.0–52.0)
Hemoglobin: 15.1 g/dL (ref 13.0–17.0)
MCH: 31.3 pg (ref 26.0–34.0)
MCHC: 33.6 g/dL (ref 30.0–36.0)
MCV: 93.4 fL (ref 80.0–100.0)
Platelets: 270 10*3/uL (ref 150–400)
RBC: 4.82 MIL/uL (ref 4.22–5.81)
RDW: 13.2 % (ref 11.5–15.5)
WBC: 10.4 10*3/uL (ref 4.0–10.5)
nRBC: 0 % (ref 0.0–0.2)

## 2020-05-03 LAB — SALICYLATE LEVEL: Salicylate Lvl: <7 mg/dL — ABNORMAL LOW (ref 7.0–30.0)

## 2020-05-03 LAB — RAPID URINE DRUG SCREEN, HOSP PERFORMED
Amphetamines: POSITIVE — AB
Barbiturates: NOT DETECTED
Benzodiazepines: NOT DETECTED
Cocaine: NOT DETECTED
Opiates: NOT DETECTED
Tetrahydrocannabinol: NOT DETECTED

## 2020-05-03 LAB — ETHANOL: Alcohol, Ethyl (B): <10 mg/dL (ref <10)

## 2020-05-03 LAB — ACETAMINOPHEN LEVEL: Acetaminophen (Tylenol), Serum: <10 ug/mL — ABNORMAL LOW (ref 10–30)

## 2020-05-03 NOTE — ED Provider Notes (Signed)
FBC/OBS ASAP Discharge Summary  Date and Time: 05/03/2020 2:25 PM  Name: Brian [name]  MRN:  [id number]   Discharge Diagnoses:  Final diagnoses:  None    Subjective: Patient reported that he was doing well today at first.  Patient denied any suicidal homicidal ideations and stated that he was admitted to get help.  Patient was interested in going to residential substance abuse treatment.  Establish the plan for the patient and started him on his medications today.  The patient then became irritated stating that he needed more medications and patient was educated on how medications had to be tapered since he had been off of them and due to his substance use.  Patient stated "if all you are going to do is give me a place to go for substance use then just let me go."  Patient was the escalated hours discussing the plan for him to go [facility] tomorrow morning and patient stated that this is not what he was wanting.  Patient asked for additional medications and he was educated and informed about medication titration and limits on the amount of medications that he can receive at one time.  Patient again stated that he wanted to discharge if this is all the help that he was going to get.  Patient was again informed that he had been established with a bed at [facility] tomorrow morning and we will provide transportation and patient stated he was ready to go home.  Patient did not endorse any suicidal or homicidal ideations and did not endorse any hallucinations.  Stay Summary: Patient is a 34 year old male that is well-known to the colon system for his multiple ED visits mainly due to substance abuse.  Patient had presented to the emergency room yesterday and was evaluated and determined to admit to overnight observation at the [facility] UC.  Patient was restarted on medications.  Patient slept well and was eating well.  Patient would have some irritability at times but would redirect and  lie down to go to sleep.  Patient was established with a residential treatment bed at [facility] for tomorrow morning.  Patient was informed of this plan and was in agreement with it.  However patient became irritated and was demanding additional medications and then demanded to leave if he cannot get more medications.  It was recommended to the patient for him to remain here overnight and discharged to [facility] tomorrow morning and patient refused and stated that he wanted to discharge.  Patient was discharged from the observation unit.  It was then reported to me that the patient continue to return to the lobby demanding to speak to others and security had to become involved and he was escorted off of the property.  At no time did the patient endorse any suicidal homicidal ideations no hallucinations  Total Time spent with patient: 20 minutes  Past Psychiatric History: Substance-induced mood disorder, amphetamine use disorder, homelessness, polysubstance abuse, bipolar 2 disorder, opioid use disorder Past Medical History:  Past Medical History:  Diagnosis Date  . Alcohol abuse   . Anxiety   . Cocaine abuse ([place])   . Depression   . Kidney stones   . Narcotic abuse ([place])   . Peptic ulcer   . Polysubstance abuse ([place])   . Renal disorder    kidney stones    Past Surgical History:  Procedure Laterality Date  . ESOPHAGOGASTRODUODENOSCOPY N/A 05/29/2014   Procedure: ESOPHAGOGASTRODUODENOSCOPY (EGD) ;  Surgeon: Beryle Beams, MD;  Location: [facility] ENDOSCOPY;  Service: Endoscopy;  Laterality: N/A;  check with Dr. Collene Mares about sedation type/timinng - I recommend MAC  . HAND SURGERY  2006   Family History: History reviewed. No pertinent family history. Family Psychiatric History: None reported Social History:  Social History   Substance and Sexual Activity  Alcohol Use Not Currently     Social History   Substance and Sexual Activity  Drug Use Not Currently  . Types:  Marijuana, Cocaine, IV    Social History   Socioeconomic History  . Marital status: Single    Spouse name: Not on file  . Number of children: Not on file  . Years of education: Not on file  . Highest education level: Not on file  Occupational History  . Not on file  Tobacco Use  . Smoking status: Current Every Day Smoker    Packs/day: 0.50    Types: Cigarettes  . Smokeless tobacco: Never Used  Vaping Use  . Vaping Use: Never used  Substance and Sexual Activity  . Alcohol use: Not Currently  . Drug use: Not Currently    Types: Marijuana, Cocaine, IV  . Sexual activity: Not on file  Other Topics Concern  . Not on file  Social History Narrative   Lives with a friend   Lost house and custody of son 9broke up with fiancee)   Nature conservation officer   Social Determinants of Radio broadcast assistant Strain:   . Difficulty of Paying Living Expenses:   Food Insecurity:   . Worried About Charity fundraiser in the Last Year:   . Arboriculturist in the Last Year:   Transportation Needs:   . Film/video editor (Medical):   Marland Kitchen Lack of Transportation (Non-Medical):   Physical Activity:   . Days of Exercise per Week:   . Minutes of Exercise per Session:   Stress:   . Feeling of Stress :   Social Connections:   . Frequency of Communication with Friends and Family:   . Frequency of Social Gatherings with Friends and Family:   . Attends Religious Services:   . Active Member of Clubs or Organizations:   . Attends Archivist Meetings:   Marland Kitchen Marital Status:    SDOH:  SDOH Screenings   Alcohol Screen: Low Risk   . Last Alcohol Screening Score (AUDIT): 4  Depression (PHQ2-9): Medium Risk  . PHQ-2 Score: 12  Financial Resource Strain:   . Difficulty of Paying Living Expenses:   Food Insecurity:   . Worried About Charity fundraiser in the Last Year:   . [place] in the Last Year:   Housing:   . Last Housing Risk Score:   Physical Activity:   . Days of  Exercise per Week:   . Minutes of Exercise per Session:   Social Connections:   . Frequency of Communication with Friends and Family:   . Frequency of Social Gatherings with Friends and Family:   . Attends Religious Services:   . Active Member of Clubs or Organizations:   . Attends Archivist Meetings:   Marland Kitchen Marital Status:   Stress:   . Feeling of Stress :   Tobacco Use: High Risk  . Smoking Tobacco Use: Current Every Day Smoker  . Smokeless Tobacco Use: Never Used  Transportation Needs:   . Film/video editor (Medical):   Marland Kitchen Lack of Transportation (Non-Medical):  Has this patient used any form of tobacco in the last 30 days? (Cigarettes, Smokeless Tobacco, Cigars, and/or Pipes) A prescription for an FDA-approved tobacco cessation medication was offered at discharge and the patient refused  Current Medications:  Current Facility-Administered Medications  Medication Dose Route Frequency Provider Last Rate Last Admin  . gabapentin (NEURONTIN) capsule 300 mg  300 mg Oral TID Connye Burkitt, NP   300 mg at 05/03/20 1024  . hydrOXYzine (ATARAX/VISTARIL) tablet 25 mg  25 mg Oral Q6H PRN Connye Burkitt, NP      . loperamide (IMODIUM) capsule 2-4 mg  2-4 mg Oral PRN Connye Burkitt, NP      . LORazepam (ATIVAN) tablet 1 mg  1 mg Oral Q6H PRN Connye Burkitt, NP   1 mg at 05/03/20 1025  . mirtazapine (REMERON) tablet 15 mg  15 mg Oral QHS Connye Burkitt, NP      . multivitamin with minerals tablet 1 tablet  1 tablet Oral Daily Connye Burkitt, NP   1 tablet at 05/03/20 1024  . OLANZapine (ZYPREXA) tablet 10 mg  10 mg Oral QHS Connye Burkitt, NP      . OLANZapine zydis (ZYPREXA) disintegrating tablet 5 mg  5 mg Oral Q8H PRN Connye Burkitt, NP      . ondansetron (ZOFRAN-ODT) disintegrating tablet 4 mg  4 mg Oral Q6H PRN Connye Burkitt, NP      . thiamine tablet 100 mg  100 mg Oral Daily Connye Burkitt, NP   100 mg at 05/03/20 1024  . traZODone (DESYREL) tablet 50 mg  50 mg Oral QHS  PRN Connye Burkitt, NP       Current Outpatient Medications  Medication Sig Dispense Refill  . famotidine (PEPCID) 20 MG tablet Take 1 tablet (20 mg total) by mouth 2 (two) times daily. (Patient not taking: Reported on 11/04/2019) 30 tablet 0  . FLUoxetine (PROZAC) 20 MG capsule Take 1 capsule (20 mg total) by mouth daily. For depression (Patient not taking: Reported on 11/04/2019) 30 capsule 0  . gabapentin (NEURONTIN) 300 MG capsule Take 2 capsules (600 mg total) by mouth 3 (three) times daily. For agitation (Patient not taking: Reported on 05/02/2020) 180 capsule 0  . mirtazapine (REMERON) 7.5 MG tablet Take 1 tablet (7.5 mg total) by mouth at bedtime. For sleep (Patient not taking: Reported on 11/04/2019) 30 tablet 0  . ondansetron (ZOFRAN ODT) 4 MG disintegrating tablet 76m ODT q4 hours prn nausea/vomit (Patient not taking: Reported on 05/02/2020) 8 tablet 0  . sucralfate (CARAFATE) 1 g tablet Take 1 tablet (1 g total) by mouth 4 (four) times daily -  with meals and at bedtime. (Patient not taking: Reported on 11/04/2019) 60 tablet 0    PTA Medications: (Not in a hospital admission)   Musculoskeletal  Strength & Muscle Tone: within normal limits Gait & Station: normal Patient leans: N/A  Psychiatric Specialty Exam  Presentation  General Appearance: Disheveled  Eye Contact:Fair  Speech:Clear and Coherent  Speech Volume:Increased  Handedness:Right   Mood and Affect  Mood:Irritable  Affect:Congruent   Thought Process  Thought Processes:Coherent  Descriptions of Associations:Intact  Orientation:Full (Time, Place and Person)  Thought Content:Logical  Hallucinations:Hallucinations: None  Ideas of Reference:None  Suicidal Thoughts:Suicidal Thoughts: Yes, Active SI Active Intent and/or Plan: With Plan  Homicidal Thoughts:Homicidal Thoughts: Yes, Passive HI Passive Intent and/or Plan: Without Plan   Sensorium  Memory:Immediate Good;Recent Good;Remote  Good  Judgment:Poor  Insight:Lacking  Executive Functions  Concentration:Fair  Attention Span:Fair  [place]   Psychomotor Activity  Psychomotor Activity:Psychomotor Activity: Normal   Assets  Assets:Communication Skills;Desire for Improvement;Resilience   Sleep  Sleep:Sleep: Fair   Physical Exam  Physical Exam Vitals and nursing note reviewed.  Constitutional:      Appearance: He is well-developed.  Cardiovascular:     Rate and Rhythm: Normal rate.  Pulmonary:     Effort: Pulmonary effort is normal.  Musculoskeletal:        General: Normal range of motion.  Skin:    General: Skin is warm.  Neurological:     Mental Status: He is alert and oriented to person, place, and time.    Review of Systems  Constitutional: Negative.   HENT: Negative.   Eyes: Negative.   Respiratory: Negative.   Cardiovascular: Negative.   Gastrointestinal: Negative.   Genitourinary: Negative.   Musculoskeletal: Negative.   Skin: Negative.   Neurological: Negative.   Endo/Heme/Allergies: Negative.   Psychiatric/Behavioral: Positive for substance abuse.   Blood pressure (!) 128/92, pulse 84, temperature (!) 96.3 F (35.7 C), temperature source Skin, resp. rate 18, height _0  (1.702 m), weight 65.3 kg, SpO2 98 %. Body mass index is 22.55 kg/m.  Demographic Factors:  Male, Caucasian and Low socioeconomic status  Loss Factors: NA  Historical Factors: Impulsivity  Risk Reduction Factors:   NA  Continued Clinical Symptoms:  Alcohol/Substance Abuse/Dependencies  Cognitive Features That Contribute To Risk:  None    Suicide Risk:  Mild:  Suicidal ideation of limited frequency, intensity, duration, and specificity.  There are no identifiable plans, no associated intent, mild dysphoria and related symptoms, good self-control (both objective and subjective assessment), few other risk factors, and identifiable protective factors,  including available and accessible social support.  Plan Of Care/Follow-up recommendations:  Continue activity as tolerated. Continue diet as recommended by your PCP. Ensure to keep all appointments with outpatient providers.  Disposition: Discharged to shelter with information to follow up at [place] morning  Lewis Shock, FNP 05/03/2020, 2:25 PM

## 2020-05-03 NOTE — ED Notes (Signed)
Patient asleep in be in no apparent distress. Will continue to assess for changes.

## 2020-05-03 NOTE — ED Notes (Signed)
Received Brian [name] this AM asleep in his chair bed.

## 2020-05-03 NOTE — Progress Notes (Signed)
Pt was discharged per his request and escorted to the front door. He refused to take his AVS.

## 2020-05-03 NOTE — BHH Counselor (Signed)
Deputy Orhun from the [employer] contacted TTS. He states yesterday Laurell Roof, [facility] contacted them with concerns about this patient's mother's safety due to threats made by patient. Deputy Ralene Ok spoke with mother who states these are "empty threats" and that "he does not have access to guns or other weapons." Provider notified.

## 2020-05-03 NOTE — ED Triage Notes (Signed)
Pt having multiple complain, HA, SI, wants some help to detox from meth, last use 3 days ago.

## 2020-05-03 NOTE — ED Notes (Signed)
Patient agitated, somewhat aggressive. Patient given medications

## 2020-05-03 NOTE — ED Notes (Signed)
Otniel woke up from his nap, screaming, asking what time is his next  medication  due and instructed this writer to go find out., now. He continued to talk in a loud voice and threatened to leave if he could not get any rest. It was explained to him private rooms are not available.The NP was called related to his plan of care.

## 2020-05-03 NOTE — ED Notes (Signed)
Snack given.

## 2020-05-04 NOTE — ED Notes (Addendum)
Pt's belongings found in [place]. Given to pt for D/C

## 2020-05-04 NOTE — ED Provider Notes (Signed)
[facility] EMERGENCY DEPARTMENT Provider Note   CSN: [id number] Arrival date & time: 05/03/20  1607     History Chief Complaint  Patient presents with  . Medical Clearance    Brian [name] is a 34 y.o. male.  Seen multiple times in the last few days for similar symptoms and multiple times been offered help and has an leaving [place].  After refusing help.  Medically cleared by psychiatry multiple times last 48 hours.  He is here with wanting help and withdrawal again.  He states that he is paranoid that they were not trying to help him.  No drug use today.        Past Medical History:  Diagnosis Date  . Alcohol abuse   . Anxiety   . Cocaine abuse ([place])   . Depression   . Kidney stones   . Narcotic abuse ([place])   . Peptic ulcer   . Polysubstance abuse ([place])   . Renal disorder    kidney stones    Patient Active Problem List   Diagnosis Date Noted  . Amphetamine use disorder, severe, dependence ([place]) 03/26/2018  . Substance induced mood disorder ([place]) 03/26/2018  . Bipolar II disorder, severe, depressed, with anxious distress ([place]) 03/25/2018  . Cellulitis 05/20/2015  . Dysuria 05/20/2015  . Smoker 05/20/2015  . Homeless single person 05/20/2015  . Drug abuse ([place])   . Opioid use disorder, severe, dependence ([place]) 03/08/2015  . Ileus of unspecified type ([place]) 05/28/2014  . Abdominal pain 05/28/2014  . AKI (acute kidney injury) ([place]) 05/28/2014  . Esophagitis, acute 05/28/2014  . Polysubstance abuse ([place]) 05/28/2014  . Hematemesis 05/28/2014    Past Surgical History:  Procedure Laterality Date  . ESOPHAGOGASTRODUODENOSCOPY N/A 05/29/2014   Procedure: ESOPHAGOGASTRODUODENOSCOPY (EGD) ;  Surgeon: Beryle Beams, MD;  Location: [facility] ENDOSCOPY;  Service: Endoscopy;  Laterality: N/A;  check with Dr. Collene Mares about sedation type/timinng - I recommend MAC  . HAND SURGERY  2006       No family history on file.  Social History    Tobacco Use  . Smoking status: Current Every Day Smoker    Packs/day: 0.50    Types: Cigarettes  . Smokeless tobacco: Never Used  Vaping Use  . Vaping Use: Never used  Substance Use Topics  . Alcohol use: Not Currently  . Drug use: Not Currently    Types: Marijuana, Cocaine, IV    Home Medications Prior to Admission medications   Medication Sig Start Date End Date Taking? Authorizing Provider  famotidine (PEPCID) 20 MG tablet Take 1 tablet (20 mg total) by mouth 2 (two) times daily. Patient not taking: Reported on 11/04/2019 08/30/19 05/04/20  Joy, Helane Gunther, PA-C  FLUoxetine (PROZAC) 20 MG capsule Take 1 capsule (20 mg total) by mouth daily. For depression Patient not taking: Reported on 11/04/2019 04/01/18 05/04/20  Lindell Spar I, NP  gabapentin (NEURONTIN) 300 MG capsule Take 2 capsules (600 mg total) by mouth 3 (three) times daily. For agitation Patient not taking: Reported on 05/02/2020 03/31/18 05/04/20  Lindell Spar I, NP  mirtazapine (REMERON) 7.5 MG tablet Take 1 tablet (7.5 mg total) by mouth at bedtime. For sleep Patient not taking: Reported on 11/04/2019 03/31/18 05/04/20  Lindell Spar I, NP  sucralfate (CARAFATE) 1 g tablet Take 1 tablet (1 g total) by mouth 4 (four) times daily -  with meals and at bedtime. Patient not taking: Reported on 11/04/2019 08/30/19 05/04/20  Lorayne Bender, PA-C  traZODone (DESYREL) 50 MG tablet Take 1 tablet (50 mg total) by mouth at bedtime as needed for sleep. Patient not taking: Reported on 08/30/2019 03/31/18 08/30/19  Lindell Spar I, NP    Allergies    Fish allergy and Shellfish allergy  Review of Systems   Review of Systems  All other systems reviewed and are negative.   Physical Exam Updated Vital Signs BP 108/64 (BP Location: Right Arm)   Pulse (!) 102   Temp 97.7 F (36.5 C) (Oral)   Resp 18   Ht _0  (1.727 m)   Wt 63.5 kg   SpO2 100%   BMI 21.29 kg/m   Physical Exam Vitals and nursing note reviewed.  Constitutional:      Appearance: He is  well-developed.  HENT:     Head: Normocephalic and atraumatic.     Nose: No congestion or rhinorrhea.     Mouth/Throat:     Mouth: Mucous membranes are moist.     Pharynx: Oropharynx is clear.  Cardiovascular:     Rate and Rhythm: Normal rate.  Pulmonary:     Effort: Pulmonary effort is normal. No respiratory distress.  Abdominal:     General: There is no distension.  Musculoskeletal:        General: Normal range of motion.     Cervical back: Normal range of motion.  Skin:    General: Skin is warm and dry.     Coloration: Skin is not jaundiced.  Neurological:     General: No focal deficit present.     Mental Status: He is alert.  Psychiatric:        Mood and Affect: Mood normal.     ED Results / Procedures / Treatments   Labs (all labs ordered are listed, but only abnormal results are displayed) Labs Reviewed  COMPREHENSIVE METABOLIC PANEL - Abnormal; Notable for the following components:      Result Value   Glucose, Bld 101 (*)    All other components within normal limits  SALICYLATE LEVEL - Abnormal; Notable for the following components:   Salicylate Lvl <6.7 (*)    All other components within normal limits  ACETAMINOPHEN LEVEL - Abnormal; Notable for the following components:   Acetaminophen (Tylenol), Serum <10 (*)    All other components within normal limits  RAPID URINE DRUG SCREEN, HOSP PERFORMED - Abnormal; Notable for the following components:   Amphetamines POSITIVE (*)    All other components within normal limits  ETHANOL  CBC    EKG None  Radiology No results found.  Procedures Procedures (including critical care time)  Medications Ordered in ED Medications - No data to display  ED Course  I have reviewed the triage vital signs and the nursing notes.  Pertinent labs & imaging results that were available during my care of the patient were reviewed by me and considered in my medical decision making (see chart for details).   MDM  Rules/Calculators/A&P                          Likely malingering. reeval in AM.   Sleeping comfortably. No disruptions. Stable for discharge for outpatient help. TOC consulted to assist in same.   Final Clinical Impression(s) / ED Diagnoses Final diagnoses:  Polysubstance abuse [facility])    Rx / DC Orders ED Discharge Orders    None       Nathania Waldman, Corene Cornea, MD 05/04/20 [id number]

## 2020-05-04 NOTE — ED Notes (Signed)
MD would like SW and CM to see him before he leaves.

## 2020-05-04 NOTE — Progress Notes (Addendum)
TOC received consult for patient for homelessness. CSW and RN CM met with patient at bedside to complete discussion - patient reports he was at [facility] ED yesterday for detox from methamphetamines. Patient reports his last use was July 4th. Patient reports he has been using meth heavily for the past five years. Patient reports he needs help quitting and is requesting to go to [facility].  CSW spoke with June at [facility] who states the patient can come to the center right now.   CSW spoke with Joelene Millin, RN to inform her of information. Patient to discharge to [place], [facility] [employer] via [employer]. CSW called to schedule patient's ride - rider waiver was completed.  Madilyn Fireman, MSW, LCSW-A Transitions of Care  Clinical Social Worker  [facility] Emergency Departments  Medical ICU [phone]

## 2020-05-16 ENCOUNTER — Telehealth (HOSPITAL_COMMUNITY): Payer: Self-pay | Admitting: *Deleted

## 2020-05-16 NOTE — Telephone Encounter (Signed)
Patient left another VM on writers phone saying that I needed to call [facility] and he was giving me permission to give the referral for him to have his medications. This is not what he and I discussed 20 min ago on phone. I asked him if he had contacted [facility] to see if he had refills since his last outpatient provider was [facility] and he cant be seen sooner than the Aug appt he already has scheduled with Dr Toy Care. Called [facility] to clarify with them what was said. Spoke with Sharyn Lull, she reported he has a refill available to him and he was told that, there is no need for further involvement with Probation officer or this agency at this time.

## 2020-05-16 NOTE — Telephone Encounter (Signed)
3 phone calls to this writer by [id number]. He has rapid speech, but is appropriate. He states he was recently evaluated downstairs and "wigged out" and left and went to [facility] and then to [facility]. He is trying to get into another substance abuse program but needs to have his medication straightened out. He doesn't understand why he dose of Remeron is less and Gabapentin and Prozac where dropped all together. Reviewed note from [facility] NP and no med changes where made and no RX written as patient didn't stay long and was referred out to the shelter and [facility] due to his behavior as patient attests to. He does have a med management appt here late Aug and therapy 8/3. He is asking for an earlier appt. Transferred his call to front desk to see if there was any earlier availability.

## 2020-05-29 ENCOUNTER — Ambulatory Visit (HOSPITAL_COMMUNITY): Payer: Self-pay | Admitting: Licensed Clinical Social Worker

## 2020-06-03 ENCOUNTER — Encounter (HOSPITAL_COMMUNITY): Payer: Self-pay

## 2020-06-03 ENCOUNTER — Emergency Department (HOSPITAL_COMMUNITY)
Admission: EM | Admit: 2020-06-03 | Discharge: 2020-06-04 | Disposition: A | Discharge status: Home / Self Care | Payer: Self-pay | Attending: Emergency Medicine | Admitting: Emergency Medicine

## 2020-06-03 ENCOUNTER — Other Ambulatory Visit: Payer: Self-pay

## 2020-06-03 DIAGNOSIS — Z765 Malingerer [conscious simulation]: Secondary | ICD-10-CM

## 2020-06-03 DIAGNOSIS — F1520 Other stimulant dependence, uncomplicated: Secondary | ICD-10-CM | POA: Diagnosis present

## 2020-06-03 DIAGNOSIS — Z20822 Contact with and (suspected) exposure to covid-19: Secondary | ICD-10-CM | POA: Insufficient documentation

## 2020-06-03 DIAGNOSIS — R440 Auditory hallucinations: Secondary | ICD-10-CM | POA: Insufficient documentation

## 2020-06-03 DIAGNOSIS — F1910 Other psychoactive substance abuse, uncomplicated: Secondary | ICD-10-CM | POA: Diagnosis present

## 2020-06-03 DIAGNOSIS — Z5900 Homelessness unspecified: Secondary | ICD-10-CM

## 2020-06-03 DIAGNOSIS — Z79899 Other long term (current) drug therapy: Secondary | ICD-10-CM | POA: Insufficient documentation

## 2020-06-03 DIAGNOSIS — F17210 Nicotine dependence, cigarettes, uncomplicated: Secondary | ICD-10-CM | POA: Insufficient documentation

## 2020-06-03 DIAGNOSIS — F19959 Other psychoactive substance use, unspecified with psychoactive substance-induced psychotic disorder, unspecified: Secondary | ICD-10-CM | POA: Diagnosis present

## 2020-06-03 DIAGNOSIS — F3181 Bipolar II disorder: Secondary | ICD-10-CM | POA: Insufficient documentation

## 2020-06-03 DIAGNOSIS — F319 Bipolar disorder, unspecified: Secondary | ICD-10-CM | POA: Insufficient documentation

## 2020-06-03 DIAGNOSIS — F1994 Other psychoactive substance use, unspecified with psychoactive substance-induced mood disorder: Secondary | ICD-10-CM | POA: Diagnosis present

## 2020-06-03 LAB — COMPREHENSIVE METABOLIC PANEL
ALT: 47 U/L — ABNORMAL HIGH (ref 0–44)
AST: 37 U/L (ref 15–41)
Albumin: 4.7 g/dL (ref 3.5–5.0)
Alkaline Phosphatase: 83 U/L (ref 38–126)
Anion gap: 12 (ref 5–15)
BUN: 15 mg/dL (ref 6–20)
CO2: 26 mmol/L (ref 22–32)
Calcium: 10.6 mg/dL — ABNORMAL HIGH (ref 8.9–10.3)
Chloride: 103 mmol/L (ref 98–111)
Creatinine, Ser: 1.21 mg/dL (ref 0.61–1.24)
GFR calc Af Amer: >60 mL/min (ref >60)
GFR calc non Af Amer: >60 mL/min (ref >60)
Glucose, Bld: 111 mg/dL — ABNORMAL HIGH (ref 70–99)
Potassium: 4.3 mmol/L (ref 3.5–5.1)
Sodium: 141 mmol/L (ref 135–145)
Total Bilirubin: 0.5 mg/dL (ref 0.3–1.2)
Total Protein: 8.3 g/dL — ABNORMAL HIGH (ref 6.5–8.1)

## 2020-06-03 LAB — CBC
HCT: 43.7 % (ref 39.0–52.0)
Hemoglobin: 14.4 g/dL (ref 13.0–17.0)
MCH: 30.9 pg (ref 26.0–34.0)
MCHC: 33 g/dL (ref 30.0–36.0)
MCV: 93.8 fL (ref 80.0–100.0)
Platelets: 350 10*3/uL (ref 150–400)
RBC: 4.66 MIL/uL (ref 4.22–5.81)
RDW: 13.7 % (ref 11.5–15.5)
WBC: 9.5 10*3/uL (ref 4.0–10.5)
nRBC: 0 % (ref 0.0–0.2)

## 2020-06-03 LAB — RAPID URINE DRUG SCREEN, HOSP PERFORMED
Amphetamines: NOT DETECTED
Barbiturates: NOT DETECTED
Benzodiazepines: NOT DETECTED
Cocaine: NOT DETECTED
Opiates: NOT DETECTED
Tetrahydrocannabinol: NOT DETECTED

## 2020-06-03 LAB — ACETAMINOPHEN LEVEL: Acetaminophen (Tylenol), Serum: <10 ug/mL — ABNORMAL LOW (ref 10–30)

## 2020-06-03 LAB — SARS CORONAVIRUS 2 BY RT PCR (HOSPITAL ORDER, PERFORMED IN ~~LOC~~ HOSPITAL LAB): SARS Coronavirus 2: NEGATIVE

## 2020-06-03 LAB — SALICYLATE LEVEL: Salicylate Lvl: <7 mg/dL — ABNORMAL LOW (ref 7.0–30.0)

## 2020-06-03 LAB — ETHANOL: Alcohol, Ethyl (B): <10 mg/dL (ref <10)

## 2020-06-03 MED ORDER — MIRTAZAPINE 30 MG PO TBDP
30.0000 mg | ORAL_TABLET | Freq: Every day | ORAL | Status: DC
  Administered 2020-06-03: 30 mg via ORAL
  Filled 2020-06-03 (×2): qty 1

## 2020-06-03 NOTE — ED Triage Notes (Signed)
Patient states he has been at the [facility] and states he has been clean for 12 days. (meth, heroin, benzos, alcohol, and marijuana)  Patient states, "My mental health is not good." patient states he was on [place] and saw  People taking pictures and became paranoid and stated that he was yelling at them and throwing his hands up like he was going to fight and had his pocket knife out at the people.  Patient denies SI, hallucinations.
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
## 2020-06-03 NOTE — BHH Counselor (Signed)
[name], PMHNP recommends patient be observed overnight in the ED for safety and stabilization. He will be reassessed by psychiatry on 8/9.
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
## 2020-06-04 ENCOUNTER — Ambulatory Visit (HOSPITAL_COMMUNITY)
Admission: EM | Admit: 2020-06-04 | Discharge: 2020-06-04 | Disposition: A | Discharge status: Home / Self Care | Payer: No Payment, Other | Attending: Registered Nurse | Admitting: Registered Nurse

## 2020-06-04 ENCOUNTER — Encounter (HOSPITAL_COMMUNITY): Payer: Self-pay

## 2020-06-04 ENCOUNTER — Other Ambulatory Visit: Payer: Self-pay

## 2020-06-04 ENCOUNTER — Encounter (HOSPITAL_COMMUNITY): Payer: Self-pay | Admitting: Registered Nurse

## 2020-06-04 DIAGNOSIS — Z79899 Other long term (current) drug therapy: Secondary | ICD-10-CM | POA: Insufficient documentation

## 2020-06-04 DIAGNOSIS — F1910 Other psychoactive substance abuse, uncomplicated: Secondary | ICD-10-CM

## 2020-06-04 DIAGNOSIS — Z8711 Personal history of peptic ulcer disease: Secondary | ICD-10-CM | POA: Insufficient documentation

## 2020-06-04 DIAGNOSIS — Z590 Homelessness: Secondary | ICD-10-CM

## 2020-06-04 DIAGNOSIS — Z765 Malingerer [conscious simulation]: Secondary | ICD-10-CM

## 2020-06-04 DIAGNOSIS — F19959 Other psychoactive substance use, unspecified with psychoactive substance-induced psychotic disorder, unspecified: Secondary | ICD-10-CM

## 2020-06-04 DIAGNOSIS — F17210 Nicotine dependence, cigarettes, uncomplicated: Secondary | ICD-10-CM | POA: Insufficient documentation

## 2020-06-04 DIAGNOSIS — F1994 Other psychoactive substance use, unspecified with psychoactive substance-induced mood disorder: Secondary | ICD-10-CM

## 2020-06-04 DIAGNOSIS — F3181 Bipolar II disorder: Secondary | ICD-10-CM

## 2020-06-04 DIAGNOSIS — Z87442 Personal history of urinary calculi: Secondary | ICD-10-CM | POA: Insufficient documentation

## 2020-06-04 DIAGNOSIS — F22 Delusional disorders: Secondary | ICD-10-CM

## 2020-06-04 DIAGNOSIS — F1520 Other stimulant dependence, uncomplicated: Secondary | ICD-10-CM

## 2020-06-04 DIAGNOSIS — F419 Anxiety disorder, unspecified: Secondary | ICD-10-CM | POA: Insufficient documentation

## 2020-06-04 MED ORDER — MIRTAZAPINE 7.5 MG PO TABS: 7.5000 mg | ORAL_TABLET | Freq: Every day | ORAL | 0 refills | Status: DC

## 2020-06-04 MED ORDER — BUPROPION HCL ER (XL) 150 MG PO TB24
150.0000 mg | ORAL_TABLET | Freq: Every day | ORAL | Status: DC
  Administered 2020-06-04: 150 mg via ORAL
  Filled 2020-06-04: qty 1

## 2020-06-04 MED ORDER — QUETIAPINE FUMARATE 50 MG PO TABS
50.0000 mg | ORAL_TABLET | Freq: Three times a day (TID) | ORAL | Status: DC
  Administered 2020-06-04: 50 mg via ORAL

## 2020-06-04 MED ORDER — QUETIAPINE FUMARATE 25 MG PO TABS
25.0000 mg | ORAL_TABLET | Freq: Three times a day (TID) | ORAL | Status: DC
  Filled 2020-06-04 (×2): qty 1

## 2020-06-04 MED ORDER — BUPROPION HCL ER (XL) 150 MG PO TB24: 150.0000 mg | ORAL_TABLET | Freq: Every day | ORAL | 0 refills | Status: DC

## 2020-06-04 MED ORDER — QUETIAPINE FUMARATE 25 MG PO TABS: 25.0000 mg | ORAL_TABLET | Freq: Three times a day (TID) | ORAL | 0 refills | Status: DC

## 2020-06-04 MED ORDER — FLUOXETINE HCL 20 MG PO CAPS: 20.0000 mg | ORAL_CAPSULE | Freq: Every day | ORAL | Status: DC

## 2020-06-04 MED ORDER — MIRTAZAPINE 7.5 MG PO TABS: 7.5000 mg | ORAL_TABLET | Freq: Every day | ORAL | Status: DC

## 2020-06-04 MED ORDER — GABAPENTIN 300 MG PO CAPS: 300.0000 mg | ORAL_CAPSULE | Freq: Three times a day (TID) | ORAL | Status: DC

## 2020-06-04 MED FILL — BUPROPION HCL XL 150 MG TAB: 150 | 30 days supply | Qty: 30 | Fill #0

## 2020-06-04 MED FILL — QUETIAPINE FUMARATE 25 MG T: 25 | 30 days supply | Qty: 90 | Fill #0

## 2020-06-04 NOTE — ED Notes (Signed)
Transported to [facility] by [employer]. Pt verified correct belongings and they were given to the driver. Safety maintained by pt not given his belt or shoes with laces. (wore purple scrubs).

## 2020-06-04 NOTE — ED Notes (Signed)
Lowed to Received Doren Custard from [facility] ED, alert, oriented and somewhat hyperactivity. He was allowed to change back in his regular cloth after the skin search ea done with [facility], LPN.He has on bilateral upper arms and a bruised left second toenail. He endorsed feeling anxious, paranoid and manic. He was given PO fluids and solids of his choice. He spoke with the NP and was compliant with his medications.

## 2020-06-04 NOTE — Discharge Instructions (Addendum)
Please proceed to [facility] for further treatment

## 2020-06-04 NOTE — ED Provider Notes (Signed)
FBC/OBS ASAP Discharge Summary  Date and Time: 06/04/2020 2:02 PM  Name: Brian [name]  MRN:  [id number]   Discharge Diagnoses:  Final diagnoses:  Psychoactive substance-induced psychosis ([place])  Amphetamine use disorder, severe, dependence ([place])  Polysubstance abuse ([place])  Bipolar II disorder, severe, depressed, with anxious distress ([place])  Malingering  Substance induced mood disorder ([place])    Subjective: "I don't need to be here, I just need to stay clean.  I need to get back so I can get back to work and to my AA/NA meetings."   Stay Summary: Patient admitted to [facility] for medication adjustment and stabilization.  Patient now stating that he does not need to be here and is wanting to go home.  Patient did request to have his therapy appointment rescheduled which was rescheduled for 07/17/20 at 2:00 pm.  Patient states "I already know the only help I need is to stay clean and today is the 13th or 14th day that I've been drug free.  I don't want to hurt myself or nobody else.  I just need to know where to go get my scripts filled.  I got a home boy who is going to help me if I need it and I need a bus pass to get back to the [facility]." During evaluation Brian [name] is alert/oriented x 4; calm/cooperative; and mood is congruent with affect.  He does not appear to be responding to internal/external stimuli or delusional thoughts.  Patient denies suicidal/self-harm/homicidal ideation, psychosis, and paranoia is currently denying paranoia stating that at times when feeling paranoid he know that it is only the effects of when he was getting high and that it will eventually go away.  Patient answered question appropriately.   Medication adjustments:  Restarted Wellbutrin 150 mg daily for depression, Continued Remeron 7.5 mg for sleep, Started Seroquel 25 mg Tid for mood stabilization.  Rescheduled therapy appointment for 07/17/20 at 2 PM and he is to keep scheduled appointment for  medication management on 07/22/20 at 1:30 PM both at [facility]       Total Time spent with patient: 30 minutes  Past Psychiatric History: See above Past Medical History:  Past Medical History:  Diagnosis Date   Alcohol abuse    Anxiety    Cocaine abuse ([place])    Depression    Kidney stones    Narcotic abuse ([place])    Peptic ulcer    Polysubstance abuse ([place])    Renal disorder    kidney stones    Past Surgical History:  Procedure Laterality Date   ESOPHAGOGASTRODUODENOSCOPY N/A 05/29/2014   Procedure: ESOPHAGOGASTRODUODENOSCOPY (EGD) ;  Surgeon: Beryle Beams, MD;  Location: [facility] ENDOSCOPY;  Service: Endoscopy;  Laterality: N/A;  check with Dr. Collene Mares about sedation type/timinng - I recommend MAC   HAND SURGERY  2006   Family History: History reviewed. No pertinent family history. Family Psychiatric History: Unaware Social History:  Social History   Substance and Sexual Activity  Alcohol Use Not Currently     Social History   Substance and Sexual Activity  Drug Use Not Currently   Types: Marijuana, Cocaine, IV   Comment: heroin and meth    Social History   Socioeconomic History   Marital status: Single    Spouse name: Not on file   Number of children: Not on file   Years of education: Not on file   Highest education level: Not on file  Occupational History   Not  on file  Tobacco Use   Smoking status: Current Every Day Smoker    Packs/day: 0.50    Types: Cigarettes   Smokeless tobacco: Never Used  Vaping Use   Vaping Use: Never used  Substance and Sexual Activity   Alcohol use: Not Currently   Drug use: Not Currently    Types: Marijuana, Cocaine, IV    Comment: heroin and meth   Sexual activity: Not on file  Other Topics Concern   Not on file  Social History Narrative   Lives with a friend   Lost house and custody of son 9broke up with fiancee)   Nature conservation officer   Social Determinants of Radio broadcast assistant Strain:     Difficulty of Paying Living Expenses:   Food Insecurity:    Worried About Charity fundraiser in the Last Year:    Arboriculturist in the Last Year:   Transportation Needs:    Film/video editor (Medical):    Lack of Transportation (Non-Medical):   Physical Activity:    Days of Exercise per Week:    Minutes of Exercise per Session:   Stress:    Feeling of Stress :   Social Connections:    Frequency of Communication with Friends and Family:    Frequency of Social Gatherings with Friends and Family:    Attends Religious Services:    Active Member of Clubs or Organizations:    Attends Music therapist:    Marital Status:    SDOH:  SDOH Screenings   Alcohol Screen: Low Risk    Last Alcohol Screening Score (AUDIT): 4  Depression (PHQ2-9): Medium Risk   PHQ-2 Score: 12  Financial Resource Strain:    Difficulty of Paying Living Expenses:   Food Insecurity:    Worried About Charity fundraiser in the Last Year:    Arboriculturist in the Last Year:   Housing:    Last Housing Risk Score:   Physical Activity:    Days of Exercise per Week:    Minutes of Exercise per Session:   Social Connections:    Frequency of Communication with Friends and Family:    Frequency of Social Gatherings with Friends and Family:    Attends Religious Services:    Active Member of Clubs or Organizations:    Attends Music therapist:    Marital Status:   Stress:    Feeling of Stress :   Tobacco Use: High Risk   Smoking Tobacco Use: Current Every Day Smoker   Smokeless Tobacco Use: Never Used  Transportation Needs:    Film/video editor (Medical):    Lack of Transportation (Non-Medical):     Has this patient used any form of tobacco in the last 30 days? (Cigarettes, Smokeless Tobacco, Cigars, and/or Pipes) A prescription for an FDA-approved tobacco cessation medication was offered at discharge and the patient refused  Current  Medications:  Current Facility-Administered Medications  Medication Dose Route Frequency Provider Last Rate Last Admin   buPROPion (WELLBUTRIN XL) 24 hr tablet 150 mg  150 mg Oral Daily Bradly Sangiovanni B, NP   150 mg at 06/04/20 1307   mirtazapine (REMERON) tablet 7.5 mg  7.5 mg Oral QHS Brayley Mackowiak B, NP       QUEtiapine (SEROQUEL) tablet 25 mg  25 mg Oral TID Kiva Norland B, NP       Current Outpatient Medications  Medication Sig Dispense Refill   [START  ON 06/05/2020] buPROPion (WELLBUTRIN XL) 150 MG 24 hr tablet Take 1 tablet (150 mg total) by mouth daily. 30 tablet 0   mirtazapine (REMERON) 7.5 MG tablet Take 1 tablet (7.5 mg total) by mouth at bedtime. 30 tablet 0   QUEtiapine (SEROQUEL) 25 MG tablet Take 1 tablet (25 mg total) by mouth 3 (three) times daily. 90 tablet 0    PTA Medications: (Not in a hospital admission)   Musculoskeletal  Strength & Muscle Tone: within normal limits Gait & Station: normal Patient leans: N/A  Psychiatric Specialty Exam  Presentation  General Appearance: Appropriate for Environment;Casual  Eye Contact:Good  Speech:Clear and Coherent;Normal Rate  Speech Volume:Normal  Handedness:Right   Mood and Affect  Mood:Labile  Affect:Appropriate;Congruent   Thought Process  Thought Processes:Coherent;Goal Directed  Descriptions of Associations:Intact  Orientation:Full (Time, Place and Person)  Thought Content:Paranoid Ideation  Hallucinations:Hallucinations: None  Ideas of Reference:Paranoia ("Just thinking everybody out to get me man")  Suicidal Thoughts:Suicidal Thoughts: No  Homicidal Thoughts:Homicidal Thoughts: No   Sensorium  Memory:Immediate Good;Remote Good;Recent Good  Judgment:Intact  Insight:Present   Executive Functions  Concentration:Good  Attention Span:Good  Ross of Knowledge:Good  Language:Good   Psychomotor Activity  Psychomotor Activity:Psychomotor Activity:  Normal   Assets  Assets:Communication Skills;Desire for Improvement;Housing (States he is currently staying at the [employer])   Sleep  Sleep:Sleep: Good   Physical Exam  Physical Exam Vitals and nursing note reviewed. Exam conducted with a chaperone present.  Constitutional:      Appearance: Normal appearance.  HENT:     Head: Normocephalic.  Pulmonary:     Effort: Pulmonary effort is normal.  Musculoskeletal:        General: Normal range of motion.  Skin:    General: Skin is warm and dry.  Neurological:     Mental Status: He is alert.  Psychiatric:        Mood and Affect: Mood normal.        Behavior: Behavior normal.        Thought Content: Thought content normal.        Judgment: Judgment normal.    Review of Systems  Psychiatric/Behavioral: Negative for hallucinations and memory loss. Depression: Stable. Substance abuse: history of meth use; drug free for 13 or 14 days today' Suicidal ideas: Denies. Nervous/anxious: Stable. Insomnia: Stable.   All other systems reviewed and are negative.  Blood pressure 124/86, pulse (!) 109, temperature 97.9 F (36.6 C), temperature source Oral, resp. rate 18, height _0  (1.727 m), weight 160 lb (72.6 kg), SpO2 100 %. Body mass index is 24.33 kg/m.  Demographic Factors:  Male  Loss Factors: None  Historical Factors: Impulsivity  Risk Reduction Factors:   Religious beliefs about death, Employed and Positive social support  Continued Clinical Symptoms:  Alcohol/Substance Abuse/Dependencies Previous Psychiatric Diagnoses and Treatments  Cognitive Features That Contribute To Risk:  None    Suicide Risk:  Minimal: No identifiable suicidal ideation.  Patients presenting with no risk factors but with morbid ruminations; may be classified as minimal risk based on the severity of the depressive symptoms  Plan Of Care/Follow-up recommendations:  Activity:  As tolerated Diet:  Heart healthy    Follow-up Information     [facility]. Go on 07/17/2020.   Specialty: Urgent Care Why: 2:00 pm for therapy appointment Contact information: [place]. Go on 07/22/2020.  Specialty: Urgent Care Why: 1:30 PM for medication management Contact information: [place] [address] [phone]               Discharge Instructions     You may contact the [place] for assistance with filling you prescription.    [facility] [address] [place], [place] [address] Phone: [phone]  You may also be able to have it filled at [facility], where you were having medications filled before.   [facility] [address] [place], [place], [place], [place] [address] Phone: [phone]   Disposition:  Psychiatrically cleared No evidence of imminent risk to self or others at present.   Patient does not meet criteria for psychiatric inpatient admission. Supportive therapy provided about ongoing stressors. Discussed crisis plan, support from social network, calling 911, coming to the Emergency Department, and calling Suicide Hotline.  Genesis Novosad, NP 06/04/2020, 2:02 PM

## 2020-06-04 NOTE — ED Notes (Signed)
Report given to Eugenie Norrie at [facility] and [facility] called.

## 2020-06-04 NOTE — BH Assessment (Signed)
[place] Assessment Progress Note  Per Hampton Abbot, MD, pt is to be is transferred to the [facility].  [facility] nurse has been notified.  Please call report to [phone].  Pt is to be transported via [facility].  [facility]'s nurse at [facility], has been notified.  Jalene Mullet, [place] Coordinator [phone]

## 2020-06-04 NOTE — Progress Notes (Signed)
Brian [name] received his discharge order, AVS and his questions were answered. His personal belongings were returned and he was given a bus token. He was escorted to the front door.

## 2020-06-04 NOTE — ED Provider Notes (Addendum)
Notified pt is accepted at [facility] per Tonette Bihari.    VItals stable.  Labs reviewed.  Medically cleared  On exam, pt is calm and cooperative.  Heart rrr.  Lungs CTA.  Mood calm     [name], MD 06/04/20 1006
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
## 2020-06-04 NOTE — ED Notes (Signed)
Belongings in locker #9

## 2020-06-04 NOTE — ED Notes (Signed)
Rang telephone 10 times at [facility] with no answer.

## 2020-06-04 NOTE — Discharge Instructions (Addendum)
You may contact the [facility] and [facility] for assistance with filling you prescription.    [facility] [address] [place], [place] [address] Phone: [phone]  You may also be able to have it filled at [facility], where you were having medications filled before.   [facility] [address] [place], [place], [place], [place] [address] Phone: [phone]

## 2020-06-04 NOTE — ED Notes (Signed)
Pt left at 1230 hrs.

## 2020-06-04 NOTE — ED Provider Notes (Signed)
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 this  range. Acetaminophen concentrations >150 ug/mL at 4 hours after ingestion  and >50 ug/mL at 12 hours after ingestion are often associated with  toxic  reactions.  Performed at [facility], [place] [address] [place], [place] [address]   . WBC 05/01/2020 9.9  4.0 - 10.5 K/uL Final  . RBC 05/01/2020 4.75  4.22 - 5.81 MIL/uL Final  . Hemoglobin 05/01/2020 14.4  13.0 - 17.0 g/dL Final  . HCT 05/01/2020 44.5  39 - 52 % Final  . MCV 05/01/2020 93.7  80.0 - 100.0 fL Final  . MCH 05/01/2020 30.3  26.0 - 34.0 pg Final  . MCHC 05/01/2020 32.4  30.0 - 36.0 g/dL Final  . RDW 05/01/2020 13.4  11.5 - 15.5 % Final  . Platelets 05/01/2020 293  150 - 400 K/uL Final  . nRBC 05/01/2020 0.0  0.0 - 0.2 % Final   Performed at [facility], [place] [address], [place], [place] [address]  . Opiates 05/01/2020 NONE DETECTED  NONE DETECTED Final  . Cocaine 05/01/2020 NONE DETECTED  NONE DETECTED Final  . Benzodiazepines 05/01/2020 POSITIVE* NONE DETECTED Final  . Amphetamines 05/01/2020 POSITIVE* NONE DETECTED Final  . Tetrahydrocannabinol 05/01/2020 POSITIVE* NONE DETECTED Final  . Barbiturates 05/01/2020 NONE DETECTED  NONE DETECTED Final   Comment: (NOTE) DRUG SCREEN FOR MEDICAL PURPOSES ONLY.  IF CONFIRMATION IS NEEDED FOR ANY PURPOSE, NOTIFY LAB WITHIN 5 DAYS.  LOWEST DETECTABLE LIMITS FOR URINE DRUG SCREEN Drug Class                     Cutoff (ng/mL) Amphetamine and metabolites    1000 Barbiturate and metabolites    200 Benzodiazepine                 563 Tricyclics and metabolites     300 Opiates and metabolites        300 Cocaine and metabolites        300 THC                            50 Performed at [facility], [place] [address], [place], [place] [address]   . SARS Coronavirus 2 05/01/2020 NEGATIVE  NEGATIVE Final   Comment: (NOTE) SARS-CoV-2 target nucleic acids are NOT DETECTED.  The SARS-CoV-2 RNA is generally detectable in upper and lower respiratory specimens during the acute phase of infection. The lowest concentration of SARS-CoV-2 viral copies this assay can detect is  250 copies / mL. A negative result does not preclude SARS-CoV-2 infection and should not be used as the sole basis for treatment or other patient management decisions.  A negative result may occur with improper specimen collection / handling, submission of specimen other than nasopharyngeal swab, presence of viral mutation(s) within the areas targeted by this assay, and inadequate number of viral copies (<250 copies / mL). A negative result must be combined with clinical observations, patient history, and epidemiological information.  Fact Sheet for Patients:   StrictlyIdeas.no  Fact Sheet for Healthcare Providers: BankingDealers.co.za  This test is not yet approved or                           cleared by the [place] FDA and has been authorized for detection and/or diagnosis of SARS-CoV-2 by FDA under an Emergency Use Authorization (EUA).  This EUA will remain in effect (meaning this test can  be used) for the duration of the COVID-19 declaration under Section 564(b)(1) of the Act, 21 U.S.C. section 360bbb-3(b)(1), unless the authorization is terminated or revoked sooner.  Performed at [facility], [place] [address], [place], [place] [address]   . HIV Screen 4th Generation wRfx 05/01/2020 Non Reactive  Non Reactive Final   Performed at [facility], [place] [address] [place], [place] [address]  . RPR Ser Ql 05/01/2020 NON REACTIVE  NON REACTIVE Final   Performed at [facility], [place] [address] [place], [place] [address]  . Total CK 05/01/2020 88  49.0 - 397.0 U/L Final   Performed at [facility], [place] [address], [place], [name] [address]    Allergies: Fish allergy and Shellfish allergy  PTA Medications: (Not in a hospital admission)   Medical Decision Making  Admitted to Continuous Assessment Medications Started Wellbutrin 150 mg daily for depression Remeron 7.5 mg for  sleep Seroquel 25 mg Tid for anxiety and mood stabilization Patient already has outpatient psychiatric services and a scheduled appointment with Dr. Edwyna Perfect 06/21/20 (Missed therapy appointment on 05/29/20)  Will order consult with social worker to reschedule therapy appointment,    Will discontinue Peer Support:  Patient feels has the outpatient services needed and wants to return to the [facility].      Recommendations  Based on my evaluation the patient does not appear to have an emergency medical condition.  Ruhani Umland, NP 06/04/20  11:39 AM

## 2020-06-19 ENCOUNTER — Encounter (HOSPITAL_COMMUNITY): Payer: Self-pay | Admitting: Emergency Medicine

## 2020-06-19 ENCOUNTER — Other Ambulatory Visit: Payer: Self-pay

## 2020-06-19 ENCOUNTER — Inpatient Hospital Stay (HOSPITAL_COMMUNITY): Payer: Self-pay

## 2020-06-19 ENCOUNTER — Inpatient Hospital Stay (HOSPITAL_COMMUNITY)
Admission: EM | Admit: 2020-06-19 | Discharge: 2020-06-21 | DRG: 281 | Disposition: A | Discharge status: Home / Self Care | Payer: Self-pay | Attending: Internal Medicine | Admitting: Internal Medicine

## 2020-06-19 ENCOUNTER — Emergency Department (HOSPITAL_COMMUNITY): Payer: Self-pay

## 2020-06-19 DIAGNOSIS — R079 Chest pain, unspecified: Secondary | ICD-10-CM

## 2020-06-19 DIAGNOSIS — N179 Acute kidney failure, unspecified: Secondary | ICD-10-CM

## 2020-06-19 DIAGNOSIS — K219 Gastro-esophageal reflux disease without esophagitis: Secondary | ICD-10-CM | POA: Diagnosis present

## 2020-06-19 DIAGNOSIS — K7460 Unspecified cirrhosis of liver: Secondary | ICD-10-CM | POA: Diagnosis present

## 2020-06-19 DIAGNOSIS — Z8249 Family history of ischemic heart disease and other diseases of the circulatory system: Secondary | ICD-10-CM

## 2020-06-19 DIAGNOSIS — R778 Other specified abnormalities of plasma proteins: Secondary | ICD-10-CM

## 2020-06-19 DIAGNOSIS — Z825 Family history of asthma and other chronic lower respiratory diseases: Secondary | ICD-10-CM

## 2020-06-19 DIAGNOSIS — B1920 Unspecified viral hepatitis C without hepatic coma: Secondary | ICD-10-CM | POA: Diagnosis present

## 2020-06-19 DIAGNOSIS — F1910 Other psychoactive substance abuse, uncomplicated: Secondary | ICD-10-CM

## 2020-06-19 DIAGNOSIS — N184 Chronic kidney disease, stage 4 (severe): Secondary | ICD-10-CM | POA: Diagnosis present

## 2020-06-19 DIAGNOSIS — F1924 Other psychoactive substance dependence with psychoactive substance-induced mood disorder: Secondary | ICD-10-CM

## 2020-06-19 DIAGNOSIS — I201 Angina pectoris with documented spasm: Secondary | ICD-10-CM | POA: Diagnosis present

## 2020-06-19 DIAGNOSIS — F1010 Alcohol abuse, uncomplicated: Secondary | ICD-10-CM | POA: Diagnosis present

## 2020-06-19 DIAGNOSIS — F1994 Other psychoactive substance use, unspecified with psychoactive substance-induced mood disorder: Secondary | ICD-10-CM | POA: Diagnosis present

## 2020-06-19 DIAGNOSIS — I200 Unstable angina: Secondary | ICD-10-CM

## 2020-06-19 DIAGNOSIS — D72829 Elevated white blood cell count, unspecified: Secondary | ICD-10-CM

## 2020-06-19 DIAGNOSIS — F17210 Nicotine dependence, cigarettes, uncomplicated: Secondary | ICD-10-CM | POA: Diagnosis present

## 2020-06-19 DIAGNOSIS — F3181 Bipolar II disorder: Secondary | ICD-10-CM | POA: Diagnosis present

## 2020-06-19 DIAGNOSIS — F1520 Other stimulant dependence, uncomplicated: Secondary | ICD-10-CM | POA: Diagnosis present

## 2020-06-19 DIAGNOSIS — N200 Calculus of kidney: Secondary | ICD-10-CM | POA: Diagnosis present

## 2020-06-19 DIAGNOSIS — F1524 Other stimulant dependence with stimulant-induced mood disorder: Secondary | ICD-10-CM

## 2020-06-19 DIAGNOSIS — F1120 Opioid dependence, uncomplicated: Secondary | ICD-10-CM | POA: Diagnosis present

## 2020-06-19 DIAGNOSIS — Z91013 Allergy to seafood: Secondary | ICD-10-CM

## 2020-06-19 DIAGNOSIS — R7989 Other specified abnormal findings of blood chemistry: Secondary | ICD-10-CM

## 2020-06-19 DIAGNOSIS — T43621A Poisoning by amphetamines, accidental (unintentional), initial encounter: Secondary | ICD-10-CM

## 2020-06-19 DIAGNOSIS — F11288 Opioid dependence with other opioid-induced disorder: Secondary | ICD-10-CM

## 2020-06-19 DIAGNOSIS — I214 Non-ST elevation (NSTEMI) myocardial infarction: Principal | ICD-10-CM | POA: Diagnosis present

## 2020-06-19 DIAGNOSIS — Z20822 Contact with and (suspected) exposure to covid-19: Secondary | ICD-10-CM | POA: Diagnosis present

## 2020-06-19 DIAGNOSIS — Z590 Homelessness: Secondary | ICD-10-CM

## 2020-06-19 DIAGNOSIS — I427 Cardiomyopathy due to drug and external agent: Secondary | ICD-10-CM | POA: Diagnosis present

## 2020-06-19 LAB — COMPREHENSIVE METABOLIC PANEL
ALT: 35 U/L (ref 0–44)
AST: 39 U/L (ref 15–41)
Albumin: 4.6 g/dL (ref 3.5–5.0)
Alkaline Phosphatase: 79 U/L (ref 38–126)
Anion gap: 14 (ref 5–15)
BUN: 14 mg/dL (ref 6–20)
CO2: 23 mmol/L (ref 22–32)
Calcium: 9.8 mg/dL (ref 8.9–10.3)
Chloride: 101 mmol/L (ref 98–111)
Creatinine, Ser: 2.61 mg/dL — ABNORMAL HIGH (ref 0.61–1.24)
GFR calc Af Amer: 35 mL/min — ABNORMAL LOW (ref >60)
GFR calc non Af Amer: 30 mL/min — ABNORMAL LOW (ref >60)
Glucose, Bld: 124 mg/dL — ABNORMAL HIGH (ref 70–99)
Potassium: 3.7 mmol/L (ref 3.5–5.1)
Sodium: 138 mmol/L (ref 135–145)
Total Bilirubin: 0.5 mg/dL (ref 0.3–1.2)
Total Protein: 8.2 g/dL — ABNORMAL HIGH (ref 6.5–8.1)

## 2020-06-19 LAB — DIFFERENTIAL
Abs Immature Granulocytes: 0.05 10*3/uL (ref 0.00–0.07)
Basophils Absolute: 0 10*3/uL (ref 0.0–0.1)
Basophils Relative: 0 %
Eosinophils Absolute: 0.3 10*3/uL (ref 0.0–0.5)
Eosinophils Relative: 2 %
Immature Granulocytes: 0 %
Lymphocytes Relative: 29 %
Lymphs Abs: 3.7 10*3/uL (ref 0.7–4.0)
Monocytes Absolute: 1 10*3/uL (ref 0.1–1.0)
Monocytes Relative: 8 %
Neutro Abs: 7.8 10*3/uL — ABNORMAL HIGH (ref 1.7–7.7)
Neutrophils Relative %: 61 %

## 2020-06-19 LAB — RAPID URINE DRUG SCREEN, HOSP PERFORMED
Amphetamines: POSITIVE — AB
Barbiturates: NOT DETECTED
Benzodiazepines: NOT DETECTED
Cocaine: NOT DETECTED
Opiates: NOT DETECTED
Tetrahydrocannabinol: NOT DETECTED

## 2020-06-19 LAB — CBC
HCT: 45.3 % (ref 39.0–52.0)
HCT: 43.4 % (ref 39.0–52.0)
Hemoglobin: 14.8 g/dL (ref 13.0–17.0)
Hemoglobin: 14.1 g/dL (ref 13.0–17.0)
MCH: 31.2 pg (ref 26.0–34.0)
MCH: 30.3 pg (ref 26.0–34.0)
MCHC: 32.7 g/dL (ref 30.0–36.0)
MCHC: 32.5 g/dL (ref 30.0–36.0)
MCV: 95.4 fL (ref 80.0–100.0)
MCV: 93.1 fL (ref 80.0–100.0)
Platelets: 300 10*3/uL (ref 150–400)
Platelets: 235 10*3/uL (ref 150–400)
RBC: 4.75 MIL/uL (ref 4.22–5.81)
RBC: 4.66 MIL/uL (ref 4.22–5.81)
RDW: 13.9 % (ref 11.5–15.5)
RDW: 13.7 % (ref 11.5–15.5)
WBC: 19.7 10*3/uL — ABNORMAL HIGH (ref 4.0–10.5)
WBC: 12.9 10*3/uL — ABNORMAL HIGH (ref 4.0–10.5)
nRBC: 0 % (ref 0.0–0.2)
nRBC: 0 % (ref 0.0–0.2)

## 2020-06-19 LAB — URINALYSIS, ROUTINE W REFLEX MICROSCOPIC
Bacteria, UA: NONE SEEN
Bilirubin Urine: NEGATIVE
Glucose, UA: NEGATIVE mg/dL
Hgb urine dipstick: NEGATIVE
Ketones, ur: 20 mg/dL — AB
Leukocytes,Ua: NEGATIVE
Nitrite: NEGATIVE
Protein, ur: 100 mg/dL — AB
Specific Gravity, Urine: 1.024 (ref 1.005–1.030)
pH: 5 (ref 5.0–8.0)

## 2020-06-19 LAB — LACTIC ACID, PLASMA
Lactic Acid, Venous: 1.3 mmol/L (ref 0.5–1.9)
Lactic Acid, Venous: 1.8 mmol/L (ref 0.5–1.9)

## 2020-06-19 LAB — HIV ANTIBODY (ROUTINE TESTING W REFLEX): HIV Screen 4th Generation wRfx: NONREACTIVE

## 2020-06-19 LAB — ECHOCARDIOGRAM COMPLETE
Area-P 1/2: 3.85 cm2
Height: 69 in
S' Lateral: 3.45 cm
Weight: 2640 oz

## 2020-06-19 LAB — TROPONIN I (HIGH SENSITIVITY)
Troponin I (High Sensitivity): 2414 ng/L (ref <18)
Troponin I (High Sensitivity): 1778 ng/L (ref <18)
Troponin I (High Sensitivity): 2148 ng/L (ref <18)
Troponin I (High Sensitivity): 1715 ng/L (ref <18)

## 2020-06-19 LAB — HEPARIN LEVEL (UNFRACTIONATED)
Heparin Unfractionated: 0.15 IU/mL — ABNORMAL LOW (ref 0.30–0.70)
Heparin Unfractionated: <0.1 IU/mL — ABNORMAL LOW (ref 0.30–0.70)

## 2020-06-19 LAB — HEMOGLOBIN A1C
Hgb A1c MFr Bld: 5.3 % (ref 4.8–5.6)
Mean Plasma Glucose: 105 mg/dL

## 2020-06-19 LAB — SARS CORONAVIRUS 2 BY RT PCR (HOSPITAL ORDER, PERFORMED IN ~~LOC~~ HOSPITAL LAB): SARS Coronavirus 2: NEGATIVE

## 2020-06-19 LAB — CK
Total CK: 909 U/L — ABNORMAL HIGH (ref 49–397)
Total CK: 1022 U/L — ABNORMAL HIGH (ref 49–397)

## 2020-06-19 LAB — ETHANOL: Alcohol, Ethyl (B): <10 mg/dL (ref <10)

## 2020-06-19 LAB — SODIUM, URINE, RANDOM: Sodium, Ur: 84 mmol/L

## 2020-06-19 LAB — CREATININE, URINE, RANDOM: Creatinine, Urine: 366.96 mg/dL

## 2020-06-19 LAB — LIPASE, BLOOD: Lipase: 26 U/L (ref 11–51)

## 2020-06-19 MED ORDER — QUETIAPINE FUMARATE 25 MG PO TABS
25.0000 mg | ORAL_TABLET | Freq: Three times a day (TID) | ORAL | Status: DC
  Administered 2020-06-19 – 2020-06-21 (×7): 25 mg via ORAL
  Filled 2020-06-19 (×10): qty 1

## 2020-06-19 MED ORDER — MIRTAZAPINE 15 MG PO TABS
7.5000 mg | ORAL_TABLET | Freq: Every day | ORAL | Status: DC
  Administered 2020-06-19 – 2020-06-20 (×2): 7.5 mg via ORAL
  Filled 2020-06-19 (×3): qty 1

## 2020-06-19 MED ORDER — NICOTINE 21 MG/24HR TD PT24
21.0000 mg | MEDICATED_PATCH | Freq: Every day | TRANSDERMAL | Status: DC
  Administered 2020-06-19 – 2020-06-21 (×3): 21 mg via TRANSDERMAL
  Filled 2020-06-19 (×3): qty 1

## 2020-06-19 MED ORDER — FOLIC ACID 1 MG PO TABS
1.0000 mg | ORAL_TABLET | Freq: Every day | ORAL | Status: DC
  Administered 2020-06-19 – 2020-06-21 (×3): 1 mg via ORAL
  Filled 2020-06-19 (×3): qty 1

## 2020-06-19 MED ORDER — ASPIRIN 81 MG PO CHEW
324.0000 mg | CHEWABLE_TABLET | Freq: Once | ORAL | Status: AC
  Administered 2020-06-19: 324 mg via ORAL
  Filled 2020-06-19: qty 4

## 2020-06-19 MED ORDER — PROMETHAZINE HCL 25 MG/ML IJ SOLN
25.0000 mg | Freq: Once | INTRAMUSCULAR | Status: AC
  Administered 2020-06-19: 25 mg via INTRAMUSCULAR
  Filled 2020-06-19: qty 1

## 2020-06-19 MED ORDER — OXYCODONE HCL 5 MG PO TABS
5.0000 mg | ORAL_TABLET | Freq: Four times a day (QID) | ORAL | Status: DC | PRN
  Administered 2020-06-20 – 2020-06-21 (×5): 5 mg via ORAL
  Filled 2020-06-19 (×5): qty 1

## 2020-06-19 MED ORDER — HEPARIN (PORCINE) 25000 UT/250ML-% IV SOLN
1300.0000 [IU]/h | INTRAVENOUS | Status: DC
  Administered 2020-06-19: 900 [IU]/h via INTRAVENOUS
  Administered 2020-06-20: 1150 [IU]/h via INTRAVENOUS
  Filled 2020-06-19 (×4): qty 250

## 2020-06-19 MED ORDER — LACTATED RINGERS IV SOLN: INTRAVENOUS | Status: AC

## 2020-06-19 MED ORDER — THIAMINE HCL 100 MG PO TABS
100.0000 mg | ORAL_TABLET | Freq: Every day | ORAL | Status: DC
  Administered 2020-06-19 – 2020-06-21 (×3): 100 mg via ORAL
  Filled 2020-06-19 (×3): qty 1

## 2020-06-19 MED ORDER — POLYETHYLENE GLYCOL 3350 17 G PO PACK: 17.0000 g | PACK | Freq: Every day | ORAL | Status: DC | PRN

## 2020-06-19 MED ORDER — CLOPIDOGREL BISULFATE 75 MG PO TABS: 75.0000 mg | ORAL_TABLET | Freq: Every day | ORAL | Status: DC

## 2020-06-19 MED ORDER — LACTATED RINGERS IV BOLUS
2000.0000 mL | Freq: Once | INTRAVENOUS | Status: AC
  Administered 2020-06-19: 2000 mL via INTRAVENOUS

## 2020-06-19 MED ORDER — CLOPIDOGREL BISULFATE 75 MG PO TABS: 300.0000 mg | ORAL_TABLET | Freq: Every day | ORAL | Status: DC

## 2020-06-19 MED ORDER — ATORVASTATIN CALCIUM 80 MG PO TABS
80.0000 mg | ORAL_TABLET | Freq: Every day | ORAL | Status: DC
  Administered 2020-06-19 – 2020-06-21 (×3): 80 mg via ORAL
  Filled 2020-06-19 (×3): qty 1

## 2020-06-19 MED ORDER — HEPARIN BOLUS VIA INFUSION
4000.0000 [IU] | Freq: Once | INTRAVENOUS | Status: AC
  Administered 2020-06-19: 4000 [IU] via INTRAVENOUS
  Filled 2020-06-19: qty 4000

## 2020-06-19 MED ORDER — BUPROPION HCL ER (XL) 150 MG PO TB24
150.0000 mg | ORAL_TABLET | Freq: Every day | ORAL | Status: DC
  Administered 2020-06-19 – 2020-06-21 (×3): 150 mg via ORAL
  Filled 2020-06-19 (×3): qty 1

## 2020-06-19 MED ORDER — HEPARIN BOLUS VIA INFUSION
2000.0000 [IU] | Freq: Once | INTRAVENOUS | Status: AC
  Administered 2020-06-19: 2000 [IU] via INTRAVENOUS
  Filled 2020-06-19: qty 2000

## 2020-06-19 NOTE — Progress Notes (Addendum)
[place] for Heparin Indication: chest pain/ACS  Allergies  Allergen Reactions  . Fish Allergy Anaphylaxis  . Shellfish Allergy Anaphylaxis    Patient Measurements: Height: 5\' 8"  (172.7 cm) Weight: 71.1 kg (156 lb 12.8 oz) IBW/kg (Calculated) : 68.4 Heparin Dosing Weight: 71.1 kg  Vital Signs: Temp: 98.3 F (36.8 C) (08/24 1616) Temp Source: Oral (08/24 1616) BP: 118/74 (08/24 1616) Pulse Rate: 94 (08/24 1616)  Labs: Recent Labs    06/19/20 0213 06/19/20 0740 06/19/20 0943 06/19/20 1636  HGB 14.8  --   --  14.1  HCT 45.3  --   --  43.4  PLT 300  --   --  235  HEPARINUNFRC  --   --   --  0.15*  CREATININE 2.61*  --   --   --   CKTOTAL  --  909*  --   --   TROPONINIHS  --  2,414* 1,778*  --     Estimated Creatinine Clearance: 38.2 mL/min (A) (by C-G formula based on SCr of 2.61 mg/dL (H)).   Medical History: Past Medical History:  Diagnosis Date  . Alcohol abuse   . Anxiety   . Cocaine abuse ([place])   . Depression   . Kidney stones   . Narcotic abuse ([place])   . Peptic ulcer   . Polysubstance abuse ([place])   . Renal disorder    kidney stones    Assessment: 30 YOM who presented on 8/24 with N/V, CP and noted to have elevated troponins concerning for ACS. Pharmacy consulted to start heparin for anticoagulation.   The patient was not noted to be on any anticoagulation PTA. CBC is wnl. Noted AKI.  Initial heparin level low at 0.15 but drawn only 3 hours after heparin started. No bleeding issues reported.  Goal of Therapy:  Heparin level 0.3-0.7 units/ml Monitor platelets by anticoagulation protocol: Yes   Plan:  Recheck heparin level at 1900 for better representation of level at current drip rate Monitor daily CBC, s/sx bleeding Cardiology planning to treat with heparin x 48 hrs - stop time entered  Arturo Morton, PharmD, BCPS Please check AMION for all [place] contact numbers Clinical Pharmacist 06/19/2020  5:33 PM    ADDENDUM - Heparin level now undetectable. No bleeding or issues with infusion per discussion with RN.  Plan: Heparin 2000 unit bolus x 1 Increase heparin to 1150 units/hr Check 6hr heparin level Monitor daily heparin level and CBC, s/sx bleeding   Arturo Morton, PharmD, BCPS Please check AMION for all [place] contact numbers Clinical Pharmacist 06/19/2020 8:41 PM

## 2020-06-19 NOTE — Consult Note (Addendum)
The patient has been Brian [name] in conjunction with Brian Sharp, PA-C. All aspects of care have been considered and discussed. The patient has been personally interviewed, examined, and all clinical data has been reviewed.   Probable non-ST elevation myocardial infarction based upon elevated cardiac markers and regional wall motion abnormality on echocardiogram.  See full echo report in the patient's chart.  Etiology could be coronary microcirculatory dysfunction related to substance use, plaque rupture, Takotsubo variant, coronary dissection, or MINOCA.  No clinical evidence of aortic dissection with the patient having equal pulses bilaterally in the neck, arms, and posterior tibial pulses bilaterally.  Acute kidney injury with doubling of creatinine compared to prior.  Etiology is uncertain.  Recommendations: Antiplatelet therapy, high intensity statin therapy, IV anticoagulation, IV hydration, and close clinical follow-up.  Continue to track cardiac markers.  Not a candidate for coronary angiography at this time with abnormal kidney function.  Coronary angiography however would be the preferred test to assess the current presentation.     Cardiology Consultation:   Patient ID: Brian Brian [name] MRN: [id number]; DOB: December 28, 1985  Admit date: 06/19/2020 Date of Consult: 06/19/2020  Primary Care Provider: [name] Cardiologist: Brian Grooms, MD new [facility] Electrophysiologist:  None    Patient Profile:   Brian Brian [name] is a 34 y.o. male with a hx of polysubstance abuse, (methamphetamine, cocaine, heroin), prior IVDU, hepatitis C, cirrhosis, GERD, prior homelessness, malingering, bipolar disorder, current smoker, and alcohol abuse who is being Brian [name] today for the evaluation of elevated troponin at the request of Dr. Dareen Brian [name].  History of Present Illness:   Brian Brian [name] several times in the ER for illicit drug use and psychiatric issues since 05/01/20.  He was Brian [name] at [facility] 04/22/20 for belching/GERD, chest discomfort, and vision changes. He ruled out for ACS. He was also hospitalized 7/9-7/12/21 for SI and paranoia. He was last Brian [name] in [facility] 06/04/20 and again at  [place] 06/04/20 with paranoia, but did not meet criteria for admission.  He has a history of medical clearances for [facility] admission, but hx of leaving AMA.   He presented to [facility] today for epigastric pain, left arm numbness, and 3 month history of right flank pain.  He reported nausea and vomiting that began yesterday. He states he was clean for 45 days and used meth yesterday. He didn't feel well after using meth and suspects that it was cut with fentanyl.  HS troponin 2414 --> 1778 CK 909 EKG is nonischemic Echo pending UDS positive for amphetamines AKI with sCr 2.61 WBC 19.7 Lipase and lactic acid normal  On my interview, he reports no IVDU for 4 years. He reports being clean for 2.5 years. He moved back to [facility] in February and started using meth again. He was clean starting July 16, but used again yesterday about 10:30 AM. At approximately 5:30pm last night he developed substernal chest pain that felt like a pressure in the middle of his chest that radiated down his left arm/left arm numbness. He thought he was having a stroke. Although throughout our discussion, his main complaint seemed to be intractable hiccups, although not active during my exam. The pain has been intermittent and he has never felt pain like this before. He is also having significant flank pain and knows his "kidneys are bad" due to heavy drug use.  He smokes cigarettes and has a strong family history of heart disease with father having an MI in his  30s.  Heart disease also reported in his paternal grandparents.   Past Medical History:  Diagnosis Date  . Alcohol abuse   . Anxiety   . Cocaine abuse ([place])   . Depression   . Kidney stones   . Narcotic abuse (Brian Brian [name])   . Peptic ulcer   .  Polysubstance abuse (Brian Brian [name])   . Renal disorder    kidney stones    Past Surgical History:  Procedure Laterality Date  . ESOPHAGOGASTRODUODENOSCOPY N/A 05/29/2014   Procedure: ESOPHAGOGASTRODUODENOSCOPY (EGD) ;  Surgeon: Brian Beams, MD;  Location: [facility] ENDOSCOPY;  Service: Endoscopy;  Laterality: N/A;  check with Dr. Collene Brian [name] about sedation type/timinng - [name] recommend MAC  . HAND SURGERY  2006     Home Medications:  Prior to Admission medications   Medication Sig Start Date End Date Taking? Authorizing Provider  buPROPion (WELLBUTRIN XL) 150 MG 24 hr tablet Take 1 tablet (150 mg total) by mouth daily. 06/05/20  Yes Brian Brian [name], Shuvon B, NP  mirtazapine (REMERON) 7.5 MG tablet Take 1 tablet (7.5 mg total) by mouth at bedtime. 06/04/20  Yes Brian Brian [name], Shuvon B, NP  QUEtiapine (SEROQUEL) 25 MG tablet Take 1 tablet (25 mg total) by mouth 3 (three) times daily. 06/04/20  Yes Brian Brian [name], Shuvon B, NP  famotidine (PEPCID) 20 MG tablet Take 1 tablet (20 mg total) by mouth 2 (two) times daily. Patient not taking: Reported on 11/04/2019 08/30/19 05/04/20  Joy, Brian Gunther, PA-C  FLUoxetine (PROZAC) 20 MG capsule Take 1 capsule (20 mg total) by mouth daily. For depression Patient not taking: Reported on 11/04/2019 04/01/18 05/04/20  Brian Spar I, NP  gabapentin (NEURONTIN) 300 MG capsule Take 2 capsules (600 mg total) by mouth 3 (three) times daily. For agitation Patient not taking: Reported on 05/02/2020 03/31/18 05/04/20  Brian Spar I, NP  sucralfate (CARAFATE) 1 g tablet Take 1 tablet (1 g total) by mouth 4 (four) times daily -  with meals and at bedtime. Patient not taking: Reported on 11/04/2019 08/30/19 05/04/20  Joy, Brian Gunther, PA-C  traZODone (DESYREL) 50 MG tablet Take 1 tablet (50 mg total) by mouth at bedtime as needed for sleep. Patient not taking: Reported on 08/30/2019 03/31/18 08/30/19  Encarnacion Slates, NP    Inpatient Medications: Scheduled Meds: . buPROPion  150 mg Oral Daily  . folic acid  1 mg Oral Daily  . mirtazapine  7.5 mg  Oral QHS  . QUEtiapine  25 mg Oral TID  . thiamine  100 mg Oral Daily   Continuous Infusions: . heparin 900 Units/hr (06/19/20 1336)   PRN Meds: oxyCODONE, polyethylene glycol  Allergies:    Allergies  Allergen Reactions  . Fish Allergy Anaphylaxis  . Shellfish Allergy Anaphylaxis    Social History:   Social History   Socioeconomic History  . Marital status: Single    Spouse name: Not on file  . Number of children: Not on file  . Years of education: Not on file  . Highest education level: Not on file  Occupational History  . Not on file  Tobacco Use  . Smoking status: Current Every Day Smoker    Packs/day: 0.50    Types: Cigarettes  . Smokeless tobacco: Never Used  Vaping Use  . Vaping Use: Never used  Substance and Sexual Activity  . Alcohol use: Not Currently  . Drug use: Not Currently    Types: Marijuana, Cocaine, IV    Comment: heroin and meth  . Sexual activity: Not on file  Other Topics Concern  . Not on file  Social History Narrative   Lives with a friend   Lost house and custody of son 9broke up with fiancee)   Nature conservation officer   Social Determinants of Health   Financial Resource Strain:   . Difficulty of Paying Living Expenses: Not on file  Food Insecurity:   . Worried About Charity fundraiser in the Last Year: Not on file  . Ran Out of Food in the Last Year: Not on file  Transportation Needs:   . Lack of Transportation (Medical): Not on file  . Lack of Transportation (Non-Medical): Not on file  Physical Activity:   . Days of Exercise per Week: Not on file  . Minutes of Exercise per Session: Not on file  Stress:   . Feeling of Stress : Not on file  Social Connections:   . Frequency of Communication with Friends and Family: Not on file  . Frequency of Social Gatherings with Friends and Family: Not on file  . Attends Religious Services: Not on file  . Active Member of Clubs or Organizations: Not on file  . Attends Archivist  Meetings: Not on file  . Marital Status: Not on file  Intimate Partner Violence:   . Fear of Current or Ex-Partner: Not on file  . Emotionally Abused: Not on file  . Physically Abused: Not on file  . Sexually Abused: Not on file    Family History:    Family History  Problem Relation Age of Onset  . Heart disease Father      ROS:  Please see the history of present illness.   All other ROS reviewed and negative.     Physical Exam/Data:   Vitals:   06/19/20 0144  BP: 125/85  Pulse: (!) 119  Resp: 19  Temp: 98.3 F (36.8 C)  TempSrc: Oral  SpO2: 95%  Weight: 74.8 kg  Height: _0  (1.753 m)   No intake or output data in the 24 hours ending 06/19/20 1501 Last 3 Weights 06/19/2020 06/04/2020 06/03/2020  Weight (lbs) 165 lb 160 lb 170 lb  Weight (kg) 74.844 kg 72.576 kg 77.111 kg     Body mass index is 24.37 kg/m.  General:  Well nourished, well developed, in no acute distress HEENT: normal Neck: no JVD Vascular: No carotid bruits  Cardiac:  normal S1, S2; RRR; no murmur  Lungs:  clear to auscultation bilaterally, no wheezing, rhonchi or rales  Abd: soft, nontender, no hepatomegaly  Ext: no edema Musculoskeletal:  No deformities, BUE and BLE strength normal and equal Skin: warm and dry  Neuro:  CNs 2-12 intact, no focal abnormalities noted Psych:  Normal affect   EKG:  The EKG was personally reviewed and demonstrates:  Sinus tachycardia with HR 112, right axis deviation, right atrial enlargement Telemetry:  Telemetry was personally reviewed and demonstrates:    Relevant CV Studies:  Echo pending  Laboratory Data:  High Sensitivity Troponin:   Recent Labs  Lab 06/19/20 0740 06/19/20 0943  TROPONINIHS 2,414* 1,778*     Chemistry Recent Labs  Lab 06/19/20 0213  NA 138  K 3.7  CL 101  CO2 23  GLUCOSE 124*  BUN 14  CREATININE 2.61*  CALCIUM 9.8  GFRNONAA 30*  GFRAA 35*  ANIONGAP 14    Recent Labs  Lab 06/19/20 0213  PROT 8.2*  ALBUMIN 4.6  AST  39  ALT 35  ALKPHOS 79  BILITOT 0.5   Hematology  Recent Labs  Lab 06/19/20 0213  WBC 19.7*  RBC 4.75  HGB 14.8  HCT 45.3  MCV 95.4  MCH 31.2  MCHC 32.7  RDW 13.9  PLT 300   BNPNo results for input(s): BNP, PROBNP in the last 168 hours.  DDimer No results for input(s): DDIMER in the last 168 hours.   Radiology/Studies:  CT Abdomen Pelvis Wo Contrast  Result Date: 06/19/2020 CLINICAL DATA:  Abdominal pain, flank pain, vomiting, acute renal failure EXAM: CT ABDOMEN AND PELVIS WITHOUT CONTRAST TECHNIQUE: Multidetector CT imaging of the abdomen and pelvis was performed following the standard protocol without IV contrast. COMPARISON:  05/30/2015 FINDINGS: Lower chest: No acute abnormality. Hepatobiliary: No solid liver abnormality is Brian [name]. No gallstones, gallbladder wall thickening, or biliary dilatation. Pancreas: Unremarkable. No pancreatic ductal dilatation or surrounding inflammatory changes. Spleen: Normal in size without significant abnormality. Adrenals/Urinary Tract: Adrenal glands are unremarkable. Nonobstructive calculus of the inferior pole of the right kidney. No hydronephrosis. Bladder is unremarkable. Stomach/Bowel: Stomach is within normal limits. Appendix is surgically absent. No evidence of bowel wall thickening, distention, or inflammatory changes. Vascular/Lymphatic: No significant vascular findings are present. No enlarged abdominal or pelvic lymph nodes. Reproductive: No mass or other significant abnormality. Other: No abdominal wall hernia or abnormality. No abdominopelvic ascites. Musculoskeletal: No acute or significant osseous findings. IMPRESSION: 1. No acute non-contrast CT findings of the abdomen or pelvis to explain abdominal pain or vomiting. 2. Nonobstructive right nephrolithiasis. No hydronephrosis. Electronically Signed   By: Eddie Candle M.D.   On: 06/19/2020 09:14   DG Chest 2 View  Result Date: 06/19/2020 CLINICAL DATA:  Left-sided chest pain EXAM: CHEST -  2 VIEW COMPARISON:  04/22/2020 FINDINGS: The heart size and mediastinal contours are within normal limits. Both lungs are clear. The visualized skeletal structures are unremarkable. IMPRESSION: No active cardiopulmonary disease. Electronically Signed   By: Davina Poke D.O.   On: 06/19/2020 08:13   US RENAL  Result Date: 06/19/2020 CLINICAL DATA:  AKI EXAM: RENAL / URINARY TRACT ULTRASOUND COMPLETE COMPARISON:  None. FINDINGS: Right Kidney: Renal measurements: 10.7 x 4.2 x 5.6 cm = volume: 131.3 mL. Echogenicity within normal limits. No mass or hydronephrosis visualized. Left Kidney: Renal measurements: 9.7 x 4.8 x 4.9 cm = volume: 119.3 mL. Echogenicity within normal limits. No mass or hydronephrosis visualized. Bladder: Appears normal for degree of bladder distention. Other: None. IMPRESSION: Normal renal ultrasound. Electronically Signed   By: Macy Mis M.D.   On: 06/19/2020 13:20       TIMI Risk Score for Unstable Angina or Non-ST Elevation MI:   The patient's TIMI risk score is 2, which indicates a 8% risk of all cause mortality, new or recurrent myocardial infarction or need for urgent revascularization in the next 14 days.     Assessment and Plan:   Elevated troponin - hs troponin 2414 --> 1778 - CK 909 - EKG does not appear ischemic - UDS positive for amphetamine - suspect CE elevation due to demand ischemia in the setting of amphetamine use, but does have CRF including smoking and FH - due to renal impairment and lack of ischemia noted on EKG, will opt to treat medically for now with 48 hr heparin and BB - echo pending - he would benefit from risk factor modification and eventual ischemic evaluation, although given his noncompliance, drug use, prior homelessness, and history of leaving AMA, he is not a great candidate for PCI   Sinus tachycardia - likely due to amphetamine use -  no SOB, no recent COVID infection or vaccination - low suspicion for PE   AKI - sCr 2.61  with leukocytosis - per medicine team   Polysubstance abuse Prior IVDU Hepatitis C with cirrhosis Current smoker Alcohol abuse - needs to stop      For questions or updates, please contact [place] Please consult [email or web address] for contact info under    Signed, Ledora Bottcher, PA  06/19/2020 3:01 PM

## 2020-06-19 NOTE — ED Triage Notes (Signed)
Pt presents to ED POV. PT c/o drug use this morning. Pt reports that he had abstained from drugs for 45 days. Pt states he used meth and that he feels like it was cut with fentanyl. Reports stomach aches and HA. COWS - 4.

## 2020-06-19 NOTE — ED Provider Notes (Signed)
[facility] EMERGENCY DEPARTMENT Provider Note   CSN: [id number] Arrival date & time: 06/19/20  0055     History Chief Complaint  Patient presents with  . Addiction Problem    Brian [name] is a 34 y.o. male.  The history is provided by the patient and medical records. No language interpreter was used.   Brian [name] is a 34 y.o. male who presents to the Emergency Department complaining of vomiting. He presents the emergency department complaining of nausea and vomiting that began yesterday. He states that he relapsed on meth amphetamine yesterday and immediately developed nausea, vomiting, bilateral flank pain and chest pain. He states that he feels dope sick. No diarrhea. He uses meth by snorting. He does have a history of IV drug use but has not used by IV route for four years. He has pain in his kidneys. He denies any fevers but does feel hot at times. He has associated dysuria. No penile discharge. Symptoms are severe, constant, worsening. He also complains of frequent belching but this is been ongoing for several months, worse over the last 24 hours.    Past Medical History:  Diagnosis Date  . Alcohol abuse   . Anxiety   . Cocaine abuse ([place])   . Depression   . Kidney stones   . Narcotic abuse ([place])   . Peptic ulcer   . Polysubstance abuse ([place])   . Renal disorder    kidney stones    Patient Active Problem List   Diagnosis Date Noted  . Psychoactive substance-induced psychosis ([place]) 06/04/2020  . Malingering 06/04/2020  . Paranoia ([place])   . Amphetamine use disorder, severe, dependence ([place]) 03/26/2018  . Substance induced mood disorder ([place]) 03/26/2018  . Bipolar II disorder, severe, depressed, with anxious distress ([place]) 03/25/2018  . Cellulitis 05/20/2015  . Dysuria 05/20/2015  . Smoker 05/20/2015  . Homeless single person 05/20/2015  . Drug abuse ([place])   . Opioid use disorder, severe, dependence ([place]) 03/08/2015  . Ileus of  unspecified type ([place]) 05/28/2014  . Abdominal pain 05/28/2014  . AKI (acute kidney injury) ([place]) 05/28/2014  . Esophagitis, acute 05/28/2014  . Polysubstance abuse ([place]) 05/28/2014  . Hematemesis 05/28/2014    Past Surgical History:  Procedure Laterality Date  . ESOPHAGOGASTRODUODENOSCOPY N/A 05/29/2014   Procedure: ESOPHAGOGASTRODUODENOSCOPY (EGD) ;  Surgeon: Beryle Beams, MD;  Location[facility] ENDOSCOPY;  Service: Endoscopy;  Laterality: N/A;  check with Dr. Collene Mares about sedation type/timinng - I recommend MAC  . HAND SURGERY  2006       History reviewed. No pertinent family history.  Social History   Tobacco Use  . Smoking status: Current Every Day Smoker    Packs/day: 0.50    Types: Cigarettes  . Smokeless tobacco: Never Used  Vaping Use  . Vaping Use: Never used  Substance Use Topics  . Alcohol use: Not Currently  . Drug use: Not Currently    Types: Marijuana, Cocaine, IV    Comment: heroin and meth    Home Medications Prior to Admission medications   Medication Sig Start Date End Date Taking? Authorizing Provider  buPROPion (WELLBUTRIN XL) 150 MG 24 hr tablet Take 1 tablet (150 mg total) by mouth daily. 06/05/20  Yes Rankin, Shuvon B, NP  mirtazapine (REMERON) 7.5 MG tablet Take 1 tablet (7.5 mg total) by mouth at bedtime. 06/04/20  Yes Rankin, Shuvon B, NP  QUEtiapine (SEROQUEL) 25 MG tablet Take 1 tablet (25 mg total) by mouth 3 (  three) times daily. 06/04/20  Yes Rankin, Shuvon B, NP  famotidine (PEPCID) 20 MG tablet Take 1 tablet (20 mg total) by mouth 2 (two) times daily. Patient not taking: Reported on 11/04/2019 08/30/19 05/04/20  Joy, Helane Gunther, PA-C  FLUoxetine (PROZAC) 20 MG capsule Take 1 capsule (20 mg total) by mouth daily. For depression Patient not taking: Reported on 11/04/2019 04/01/18 05/04/20  Lindell Spar I, NP  gabapentin (NEURONTIN) 300 MG capsule Take 2 capsules (600 mg total) by mouth 3 (three) times daily. For agitation Patient not taking: Reported on 05/02/2020  03/31/18 05/04/20  Lindell Spar I, NP  sucralfate (CARAFATE) 1 g tablet Take 1 tablet (1 g total) by mouth 4 (four) times daily -  with meals and at bedtime. Patient not taking: Reported on 11/04/2019 08/30/19 05/04/20  Joy, Helane Gunther, PA-C  traZODone (DESYREL) 50 MG tablet Take 1 tablet (50 mg total) by mouth at bedtime as needed for sleep. Patient not taking: Reported on 08/30/2019 03/31/18 08/30/19  Lindell Spar I, NP    Allergies    Fish allergy and Shellfish allergy  Review of Systems   Review of Systems  All other systems reviewed and are negative.   Physical Exam Updated Vital Signs BP 125/85 (BP Location: Right Arm)   Pulse (!) 119   Temp 98.3 F (36.8 C) (Oral)   Resp 19   Ht _0  (1.753 m)   Wt 74.8 kg   SpO2 95%   BMI 24.37 kg/m   Physical Exam Vitals and nursing note reviewed.  Constitutional:      Appearance: He is well-developed.     Comments: Frequent belching  HENT:     Head: Normocephalic and atraumatic.  Cardiovascular:     Rate and Rhythm: Regular rhythm. Tachycardia present.     Heart sounds: No murmur heard.   Pulmonary:     Effort: Pulmonary effort is normal. No respiratory distress.     Breath sounds: Normal breath sounds.  Abdominal:     Palpations: Abdomen is soft.     Tenderness: There is no abdominal tenderness. There is no guarding or rebound.  Musculoskeletal:        General: No tenderness.  Skin:    General: Skin is warm and dry.  Neurological:     Mental Status: He is alert and oriented to person, place, and time.  Psychiatric:        Behavior: Behavior normal.     ED Results / Procedures / Treatments   Labs (all labs ordered are listed, but only abnormal results are displayed) Labs Reviewed  COMPREHENSIVE METABOLIC PANEL - Abnormal; Notable for the following components:      Result Value   Glucose, Bld 124 (*)    Creatinine, Ser 2.61 (*)    Total Protein 8.2 (*)    GFR calc non Af Amer 30 (*)    GFR calc Af Amer 35 (*)    All other  components within normal limits  CBC - Abnormal; Notable for the following components:   WBC 19.7 (*)    All other components within normal limits  RAPID URINE DRUG SCREEN, HOSP PERFORMED - Abnormal; Notable for the following components:   Amphetamines POSITIVE (*)    All other components within normal limits  CK - Abnormal; Notable for the following components:   Total CK 909 (*)    All other components within normal limits  TROPONIN I (HIGH SENSITIVITY) - Abnormal; Notable for the following components:  Troponin I (High Sensitivity) 2,414 (*)    All other components within normal limits  URINE CULTURE  CULTURE, BLOOD (ROUTINE X 2)  CULTURE, BLOOD (ROUTINE X 2)  SARS CORONAVIRUS 2 BY RT PCR (HOSPITAL ORDER, [facility])  ETHANOL  LIPASE, BLOOD  LACTIC ACID, PLASMA  URINALYSIS, ROUTINE W REFLEX MICROSCOPIC  LACTIC ACID, PLASMA  TROPONIN I (HIGH SENSITIVITY)    EKG EKG Interpretation  Date/Time:  Tuesday June 19 2020 03:06:03 EDT Ventricular Rate:  112 PR Interval:  134 QRS Duration: 72 QT Interval:  324 QTC Calculation: 442 R Axis:   103 Text Interpretation: Sinus tachycardia Right atrial enlargement Rightward axis Pulmonary disease pattern Confirmed by Dory Horn) on 06/19/2020 5:50:19 AM   Radiology CT Abdomen Pelvis Wo Contrast  Result Date: 06/19/2020 CLINICAL DATA:  Abdominal pain, flank pain, vomiting, acute renal failure EXAM: CT ABDOMEN AND PELVIS WITHOUT CONTRAST TECHNIQUE: Multidetector CT imaging of the abdomen and pelvis was performed following the standard protocol without IV contrast. COMPARISON:  05/30/2015 FINDINGS: Lower chest: No acute abnormality. Hepatobiliary: No solid liver abnormality is seen. No gallstones, gallbladder wall thickening, or biliary dilatation. Pancreas: Unremarkable. No pancreatic ductal dilatation or surrounding inflammatory changes. Spleen: Normal in size without significant abnormality.  Adrenals/Urinary Tract: Adrenal glands are unremarkable. Nonobstructive calculus of the inferior pole of the right kidney. No hydronephrosis. Bladder is unremarkable. Stomach/Bowel: Stomach is within normal limits. Appendix is surgically absent. No evidence of bowel wall thickening, distention, or inflammatory changes. Vascular/Lymphatic: No significant vascular findings are present. No enlarged abdominal or pelvic lymph nodes. Reproductive: No mass or other significant abnormality. Other: No abdominal wall hernia or abnormality. No abdominopelvic ascites. Musculoskeletal: No acute or significant osseous findings. IMPRESSION: 1. No acute non-contrast CT findings of the abdomen or pelvis to explain abdominal pain or vomiting. 2. Nonobstructive right nephrolithiasis. No hydronephrosis. Electronically Signed   By: Eddie Candle M.D.   On: 06/19/2020 09:14   DG Chest 2 View  Result Date: 06/19/2020 CLINICAL DATA:  Left-sided chest pain EXAM: CHEST - 2 VIEW COMPARISON:  04/22/2020 FINDINGS: The heart size and mediastinal contours are within normal limits. Both lungs are clear. The visualized skeletal structures are unremarkable. IMPRESSION: No active cardiopulmonary disease. Electronically Signed   By: Davina Poke D.O.   On: 06/19/2020 08:13    Procedures Procedures (including critical care time) CRITICAL CARE Performed by: Quintella Reichert   Total critical care time: 40 minutes  Critical care time was exclusive of separately billable procedures and treating other patients.  Critical care was necessary to treat or prevent imminent or life-threatening deterioration.  Critical care was time spent personally by me on the following activities: development of treatment plan with patient and/or surrogate as well as nursing, discussions with consultants, evaluation of patient's response to treatment, examination of patient, obtaining history from patient or surrogate, ordering and performing treatments and  interventions, ordering and review of laboratory studies, ordering and review of radiographic studies, pulse oximetry and re-evaluation of patient's condition.  Medications Ordered in ED Medications  lactated ringers bolus 2,000 mL (2,000 mLs Intravenous New Bag/Given 06/19/20 0806)  promethazine (PHENERGAN) injection 25 mg (25 mg Intramuscular Given 06/19/20 0830)    ED Course  I have reviewed the triage vital signs and the nursing notes.  Pertinent labs & imaging results that were available during my care of the patient were reviewed by me and considered in my medical decision making (see chart for details).    MDM  Rules/Calculators/A&P                         Patient with history of methamphetamine abuse here for evaluation of belching, vomiting for the last 24 hours. He does also report that he is experienced intermittent chest pain. He is a very difficult historian and is hard to get better characterization of his chest pain. Labs with acute kidney injury. He does appear dehydrated and was treated with IV fluids. Troponin was ordered due to reports of chest pain. EKG without acute ischemic changes. Troponin returns elevated. Will start on heparin drip. Cardiology consulted regarding elevation in troponin. Medicine consulted for admission for AKI, elevation in troponin and persistent vomiting.  Patient updated of findings of studies recommendation for admission and he is in agreement with treatment plan. Final Clinical Impression(s) / ED Diagnoses Final diagnoses:  AKI (acute kidney injury) ([place])  Substance abuse ([place])  Elevated troponin    Rx / DC Orders ED Discharge Orders    None       Quintella Reichert, MD 06/19/20 1536

## 2020-06-19 NOTE — Progress Notes (Signed)
ANTICOAGULATION CONSULT NOTE - Initial Consult  Pharmacy Consult for Heparin Indication: chest pain/ACS  Allergies  Allergen Reactions  . Fish Allergy Anaphylaxis  . Shellfish Allergy Anaphylaxis    Patient Measurements: Height: 5\' 9"  (175.3 cm) Weight: 74.8 kg (165 lb) IBW/kg (Calculated) : 70.7 Heparin Dosing Weight: 74.8 kg  Vital Signs: Temp: 98.3 F (36.8 C) (08/24 0144) Temp Source: Oral (08/24 0144) BP: 125/85 (08/24 0144) Pulse Rate: 119 (08/24 0144)  Labs: Recent Labs    06/19/20 0213 06/19/20 0740  HGB 14.8  --   HCT 45.3  --   PLT 300  --   CREATININE 2.61*  --   CKTOTAL  --  909*  TROPONINIHS  --  2,414*    Estimated Creatinine Clearance: 39.5 mL/min (A) (by C-G formula based on SCr of 2.61 mg/dL (H)).   Medical History: Past Medical History:  Diagnosis Date  . Alcohol abuse   . Anxiety   . Cocaine abuse ([place])   . Depression   . Kidney stones   . Narcotic abuse ([place])   . Peptic ulcer   . Polysubstance abuse ([place])   . Renal disorder    kidney stones    Assessment: 45 YOM who presented on 8/24 with N/V, CP and noted to have elevated troponins concerning for ACS. Pharmacy consulted to start Heparin for anticoagulation.   The patient was not noted to be on any anticoagulation PTA. CBC is wnl.   Goal of Therapy:  Heparin level 0.3-0.7 units/ml Monitor platelets by anticoagulation protocol: Yes   Plan:  - Heparin 4000 unit bolus x 1 - Start Heparin at a rate of 900 units/hr (9 ml/hr) - Will continue to monitor for any signs/symptoms of bleeding and will follow up with heparin level in 6 hours   Thank you for allowing pharmacy to be a part of this patient's care.  Alycia Rossetti, PharmD, BCPS Clinical Pharmacist Clinical phone for 06/19/2020: [phone] 06/19/2020 10:16 AM   **Pharmacist phone directory can now be found on [email or web address] ([email or web address]).  Listed under [place].

## 2020-06-19 NOTE — Progress Notes (Signed)
CK resulted 1022. MD paged and made aware. New orders received.

## 2020-06-19 NOTE — ED Notes (Signed)
Pt not available for assessment. Per previous RN pt transported to [facility].

## 2020-06-19 NOTE — H&P (Addendum)
Date: 06/19/2020               Patient Name:  Brian [name] MRN: [id number]  DOB: Jul 18, 1986 Age / Sex: 34 y.o., male   PCP: Beverly Sessions         Medical Service: Internal Medicine Teaching Service         Attending Physician: Dr. Aldine Contes, MD    First Contact: Dr. Collene Gobble Pager: [phone]  Second Contact: Truman Hayward, MD, Vonna Kotyk Pager: Governor Rooks [phone])       After Hours (After 5p/  First Contact Pager: [phone]  weekends / holidays): Second Contact Pager: [phone]   Chief Complaint: Right flank pain, chest discomfort  History of Present Illness: Mr. Donath is a 34 year old person living with amphetamine use disorder, GERD/belching, Hepatitis C with incomplete treatment, cirrhosis, malingering, prior IV drug use, bipolar 2 disorder, alcohol use disorder, cocaine use, nephrolithiasis, opioid use disorder, suicidal ideation who presented to the emergency department with complaint of vomiting, right flank pain and chest discomfort.  He reports that he has been experiencing epigastric discomfort intermittently for the past 3 months which he attributed to his chronic GERD and belching.  His epigastric pain is nonradiating even though he states that in the emergency department today he felt that his left arm was numb.  He describes the pain as sharp and does not report of any aggravating or elevating factors.  The pain is unrelieved with Tums, Pepcid or Mylanta.  He denies shortness of breath, orthopnea, dizziness or lightheadedness.  He also mentions for 19-month history of right flank pain with intermittent dysuria however denies hematuria, urinary frequency or urinary urgency.  He also admits to experiencing nausea with nonbloody nonbilious emesis.  His pain is sharp in nature and radiates to his right groin. He also continues to endorse vision changes which he has had for 3 months stating that he sees spots when his pain worsens   On April 22, 2020 he presented to [facility] emergency  department complaining of belching/GERD, chest discomfort and vision abnormalities.  He was found to have reassuring troponin, EKG without concerning for ACS.  He received ODT Zofran, Maalox and dicloxacillin for his stomach discomfort and was subsequently discharged.  He was subsequently readmitted to [facility] from May 04, 2020 to May 07, 2020 with suicidal ideations and paranoia after he presented voluntarily.   Lab Orders     Urine culture     Culture, blood (routine x 2)     SARS Coronavirus 2 by RT PCR (hospital order, performed in [facility] hospital lab) Nasopharyngeal Nasopharyngeal Swab     Comprehensive metabolic panel     Ethanol     cbc     Rapid urine drug screen (hospital performed)     Urinalysis, Routine w reflex microscopic     Lipase, blood     Lactic acid, plasma     CK     Heparin level (unfractionated)     Heparin level (unfractionated)     Urinalysis, Routine w reflex microscopic     Sodium, urine, random     Creatinine, urine, random     CK     CK     Hemoglobin A1c     Comprehensive metabolic panel     CBC   Meds:  Current Meds  Medication Sig  . buPROPion (WELLBUTRIN XL) 150 MG 24 hr tablet Take 1 tablet (150 mg total) by mouth daily.  . mirtazapine (REMERON)  7.5 MG tablet Take 1 tablet (7.5 mg total) by mouth at bedtime.  Marland Kitchen QUEtiapine (SEROQUEL) 25 MG tablet Take 1 tablet (25 mg total) by mouth 3 (three) times daily.     Allergies: Allergies as of 06/19/2020 - Review Complete 06/19/2020  Allergen Reaction Noted  . Fish allergy Anaphylaxis 03/24/2018  . Shellfish allergy Anaphylaxis 11/04/2019   Past Medical History:  Diagnosis Date  . Alcohol abuse   . Anxiety   . Cocaine abuse ([place])   . Depression   . Kidney stones   . Narcotic abuse ([place])   . Peptic ulcer   . Polysubstance abuse ([place])   . Renal disorder    kidney stones    Family History: Grandmother with COPD, CHF and myocardial infarction  Social History:  Recently moved from [place] and used to live in a trailer.  He states that he is trying to get into a sober facility called "[facility] "but they state that he has to be clean for several days before he can be enrolled.  Smokes 1 to 2 pack of cigarettes per day since age 34 years old.  Denies current alcohol use and states his last use of alcohol was 2 years ago.  He snorts amphetamine (ice).  In the past, he used to inject heroin and meth.  His last use of heroin was 4 years ago.  Review of Systems: A complete ROS was negative except as per HPI.   Physical Exam: Blood pressure 125/85, pulse (!) 119, temperature 98.3 F (36.8 C), temperature source Oral, resp. rate 19, height 5\' 9"  (1.753 m), weight 74.8 kg, SpO2 95 %. Physical Exam Vitals and nursing note reviewed.  Constitutional:      General: He is not in acute distress.    Appearance: He is not ill-appearing, toxic-appearing or diaphoretic.  HENT:     Head: Normocephalic and atraumatic.     Mouth/Throat:     Mouth: Mucous membranes are dry.  Eyes:     General:        Right eye: No discharge.        Left eye: No discharge.     Conjunctiva/sclera: Conjunctivae normal.  Cardiovascular:     Rate and Rhythm: Normal rate.     Pulses: Normal pulses.     Heart sounds: Normal heart sounds. No murmur heard.  No gallop.   Pulmonary:     Effort: Pulmonary effort is normal.     Breath sounds: Normal breath sounds. No wheezing or rales.  Abdominal:     General: Abdomen is flat.     Palpations: Abdomen is soft.     Tenderness: There is abdominal tenderness (Right side of abdomen/Flank). There is no right CVA tenderness or left CVA tenderness.  Musculoskeletal:        General: No swelling or deformity.     Cervical back: Neck supple.     Right lower leg: No edema.     Left lower leg: No edema.  Skin:    General: Skin is warm.  Neurological:     Mental Status: He is alert.  Psychiatric:        Mood and Affect: Mood normal.         Behavior: Behavior normal.     EKG: No evidence of ACS, old inverted T waves in V1  CXR: personally reviewed my interpretation is unremarkable   Assessment & Plan by Problem: Active Problems:   AKI (acute kidney injury) ([place])   Opioid use  disorder, severe, dependence ([place])   Bipolar II disorder, severe, depressed, with anxious distress ([name])   Amphetamine use disorder, severe, dependence ([place])   Substance induced mood disorder ([place])   Chest pain   Mr. Billey is a 34 year old person living with amphetamine use disorder, GERD/belching, Hepatitis C with incomplete treatment, cirrhosis, malingering, prior IV drug use, bipolar 2 disorder, alcohol use disorder, cocaine use, nephrolithiasis, opioid use disorder, suicidal ideation an acute kidney injury and what appears to be NSTEMI   #Troponinemia secondary to NSTEMI #?  Methamphetamine induced cardiomyopathy Acute onset chest discomfort with associated nausea and vomiting.  EKG unrevealing for ACS however troponin elevated at 2414.  Difficult to assess if this is a type I NSTEMI or type II NSTEMI from methamphetamine induced cardiomyopathy started on heparin infusion in the emergency department and status post 1 dose of aspirin 325 mg.  Initial chest x-ray does not show any evidence of pulmonary edema -Continue heparin gtt  -Follow-up echocardiogram -Follow-up lipid panel, hemoglobin A1c -Follow-up repeat troponin -Follow-up cardiology recommendations   #Acute kidney injury Suspecting prerenal etiology.  Serum creatinine elevated at 2.6.  Baseline of 1.2.  CK elevated at 909.  Status post 2 L LR bolus -Follow-up urinalysis, urine sodium, urine creatinine -Follow daily CMP -Follow-up CK in the a.m. -Hold off further IVF resuscitation until echocardiogram results   #Right flank pain secondary to nonobstructing nephrolithiasis -Status post IVF resuscitation -Pain regimen: Oxycodone 5mg  q6hrs   #Lekocytosis  DDx reactive from  ongoing cardiac insult/nephrolithiasis vs pain vs UTI vs dehydration.  -F/u HIV, RPR, Differential, blood cultures and daily CBC -hold off imitating antibiotics for now    #Bipolar 2 disorder  #Substance-induced mood disorder -Resume Wellbutrin, mirtazapine and quetiapine   #Polysubstance use disorder #Homelessness -COC consulted for assistance -COWS every 4 hours, CIWA every 4 hours without Ativan   FEN: N.p.o. except sips with meds VTE ppx: Heparin GTT CODE STATUS: Full code  Prior to Admission Living Arrangement: Unknown Anticipated Discharge Location: Unknown Barriers to Discharge: Treatment of AKI and cardiomyopathy  Dispo: Admit patient to Inpatient with expected length of stay greater than 2 midnights.  Signed: Jean Rosenthal, MD 06/19/2020, 10:33 AM  Pager: [phone] Internal Medicine Teaching Service After 5pm on weekdays and 1pm on weekends: On Call pager: [phone]

## 2020-06-20 DIAGNOSIS — K219 Gastro-esophageal reflux disease without esophagitis: Secondary | ICD-10-CM

## 2020-06-20 DIAGNOSIS — N179 Acute kidney failure, unspecified: Secondary | ICD-10-CM

## 2020-06-20 DIAGNOSIS — B1920 Unspecified viral hepatitis C without hepatic coma: Secondary | ICD-10-CM

## 2020-06-20 LAB — COMPREHENSIVE METABOLIC PANEL
ALT: 34 U/L (ref 0–44)
AST: 43 U/L — ABNORMAL HIGH (ref 15–41)
Albumin: 3.4 g/dL — ABNORMAL LOW (ref 3.5–5.0)
Alkaline Phosphatase: 67 U/L (ref 38–126)
Anion gap: 7 (ref 5–15)
BUN: 20 mg/dL (ref 6–20)
CO2: 28 mmol/L (ref 22–32)
Calcium: 8.8 mg/dL — ABNORMAL LOW (ref 8.9–10.3)
Chloride: 104 mmol/L (ref 98–111)
Creatinine, Ser: 1.52 mg/dL — ABNORMAL HIGH (ref 0.61–1.24)
GFR calc Af Amer: >60 mL/min (ref >60)
GFR calc non Af Amer: 59 mL/min — ABNORMAL LOW (ref >60)
Glucose, Bld: 77 mg/dL (ref 70–99)
Potassium: 4.6 mmol/L (ref 3.5–5.1)
Sodium: 139 mmol/L (ref 135–145)
Total Bilirubin: 0.8 mg/dL (ref 0.3–1.2)
Total Protein: 6.6 g/dL (ref 6.5–8.1)

## 2020-06-20 LAB — HEPARIN LEVEL (UNFRACTIONATED)
Heparin Unfractionated: 0.58 IU/mL (ref 0.30–0.70)
Heparin Unfractionated: 0.26 IU/mL — ABNORMAL LOW (ref 0.30–0.70)

## 2020-06-20 LAB — CK: Total CK: 767 U/L — ABNORMAL HIGH (ref 49–397)

## 2020-06-20 LAB — CBC
HCT: 43 % (ref 39.0–52.0)
Hemoglobin: 13.7 g/dL (ref 13.0–17.0)
MCH: 29.9 pg (ref 26.0–34.0)
MCHC: 31.9 g/dL (ref 30.0–36.0)
MCV: 93.9 fL (ref 80.0–100.0)
Platelets: 240 10*3/uL (ref 150–400)
RBC: 4.58 MIL/uL (ref 4.22–5.81)
RDW: 13.6 % (ref 11.5–15.5)
WBC: 11.6 10*3/uL — ABNORMAL HIGH (ref 4.0–10.5)
nRBC: 0 % (ref 0.0–0.2)

## 2020-06-20 LAB — URINE CULTURE: Culture: NO GROWTH

## 2020-06-20 LAB — RPR: RPR Ser Ql: NONREACTIVE

## 2020-06-20 MED ORDER — ASPIRIN 81 MG PO CHEW
81.0000 mg | CHEWABLE_TABLET | Freq: Every day | ORAL | Status: DC
  Administered 2020-06-20 – 2020-06-21 (×2): 81 mg via ORAL
  Filled 2020-06-20 (×2): qty 1

## 2020-06-20 MED ORDER — PANTOPRAZOLE SODIUM 40 MG PO TBEC
40.0000 mg | DELAYED_RELEASE_TABLET | Freq: Every day | ORAL | Status: DC
  Administered 2020-06-20 – 2020-06-21 (×2): 40 mg via ORAL
  Filled 2020-06-20 (×2): qty 1

## 2020-06-20 MED ORDER — LACTATED RINGERS IV SOLN: INTRAVENOUS | Status: AC

## 2020-06-20 MED ORDER — BACLOFEN 5 MG HALF TABLET
5.0000 mg | ORAL_TABLET | Freq: Three times a day (TID) | ORAL | Status: AC
  Administered 2020-06-20 – 2020-06-21 (×3): 5 mg via ORAL
  Filled 2020-06-20 (×3): qty 1

## 2020-06-20 MED ORDER — ALUM & MAG HYDROXIDE-SIMETH 200-200-20 MG/5ML PO SUSP
30.0000 mL | Freq: Four times a day (QID) | ORAL | Status: DC | PRN
  Administered 2020-06-20: 30 mL via ORAL
  Filled 2020-06-20: qty 30

## 2020-06-20 MED ORDER — ASPIRIN 81 MG PO CHEW: 81.0000 mg | CHEWABLE_TABLET | Freq: Every day | ORAL | Status: DC

## 2020-06-20 MED ORDER — BACLOFEN 5 MG HALF TABLET: 5.0000 mg | ORAL_TABLET | Freq: Three times a day (TID) | ORAL | Status: DC

## 2020-06-20 NOTE — Progress Notes (Signed)
Nutrition Brief Note  Patient identified on the Malnutrition Screening Tool (MST) Report  Wt Readings from Last 15 Encounters:  06/20/20 74.5 kg  06/03/20 77.1 kg  05/03/20 63.5 kg  05/01/20 63.5 kg  11/04/19 81.6 kg  04/30/18 79.4 kg  03/24/18 74.8 kg  03/23/18 72.6 kg  05/25/15 63.5 kg  05/20/15 63.5 kg  05/19/15 65.8 kg  03/25/15 68 kg  03/24/15 68 kg  03/10/15 65.1 kg  03/08/15 70.3 kg   Mr. Viviano is a 34 year old person living with amphetamine use disorder, GERD/belching, Hepatitis C with incomplete treatment, cirrhosis, malingering, prior IV drug use, bipolar 2 disorder, alcohol use disorder, cocaine use, nephrolithiasis, opioid use disorder, suicidal ideation an acute kidney injury and what appears to be NSTEMI  Pt admitted with AKI, troponinemia secondary to NSTEMI, and methamphetamine induced cardiomyopathy.   Reviewed I/O's: +826 ml x 24 hours  Spoke with pt at bedside, who reports he is very anxious about possible procedure (coronary angiography) tomorrow. He states "I didn't know that I had a heart problem and that makes me anxious". RD allowed pt to speak about his anxiousness to help provide comfort. Pt shares with this RD that he had been sober for 2 years prior to February 2021 and relapsed again this past July. Per pt, "when I'm off the drugs, I eat great. When I'm on the drugs I eat hardly anything". Pt shared with this RD a picture of his driver's licence; pt was a lot thinner today in comparison to the ID picture. He explains that he used to weight 250#, but was healthy and worked out daily. Reviewed wt hx; wt has been mostly stable over the past year.   Pt reports he has a great appetite and has been eating all of his meals. Noted meal completion 100%. RD discussed importance of good meal and supplement intake to promote healing.   Nutrition-Focused physical exam completed. Findings are no fat depletion, no muscle depletion, and no edema.   Medications  reviewed and include remeron, folvite, and thimaine.   Labs reviewed.   Current diet order is Heart Healthy, patient is consuming approximately 100% of meals at this time. Labs and medications reviewed.   No nutrition interventions warranted at this time. If nutrition issues arise, please consult RD.   Loistine Chance, RD, LDN, [place] Registered Dietitian II Certified Diabetes Care and Education Specialist Please refer to [facility] for RD and/or RD on-call/weekend/after hours pager

## 2020-06-20 NOTE — Progress Notes (Signed)
Progress Note  Patient Name: Brian [name] Date of Encounter: 06/20/2020  [facility] Cardiologist: Sinclair Grooms, MD   Subjective   Still continues to have intermittent chest discomfort.  Inpatient Medications    Scheduled Meds: . atorvastatin  80 mg Oral Daily  . buPROPion  150 mg Oral Daily  . folic acid  1 mg Oral Daily  . mirtazapine  7.5 mg Oral QHS  . nicotine  21 mg Transdermal Daily  . pantoprazole  40 mg Oral Daily  . QUEtiapine  25 mg Oral TID  . thiamine  100 mg Oral Daily   Continuous Infusions: . heparin 1,150 Units/hr (06/19/20 2101)   PRN Meds: alum & mag hydroxide-simeth, oxyCODONE, polyethylene glycol   Vital Signs    Vitals:   06/19/20 1939 06/19/20 2355 06/20/20 0325 06/20/20 0333  BP: 113/72 (!) 97/58  111/77  Pulse: (!) 101 82  91  Resp: 20 16  17   Temp: 98.1 F (36.7 C) 98.5 F (36.9 C)  (!) 97.5 F (36.4 C)  TempSrc: Oral Oral  Oral  SpO2: 99% 99%  98%  Weight:   74.5 kg   Height:        Intake/Output Summary (Last 24 hours) at 06/20/2020 0902 Last data filed at 06/20/2020 0500 Gross per 24 hour  Intake 826.25 ml  Output --  Net 826.25 ml   Last 3 Weights 06/20/2020 06/19/2020 06/19/2020  Weight (lbs) 164 lb 4.8 oz 156 lb 12.8 oz 165 lb  Weight (kg) 74.526 kg 71.124 kg 74.844 kg  Some encounter information is confidential and restricted. Go to Review Flowsheets activity to see all data.      Telemetry    Normal sinus rhythm- Personally Reviewed  ECG    Last EKG performed was greater than 24 hours ago. Will repeat an EKG today.- Personally Reviewed  Physical Exam  Healthy-appearing GEN: No acute distress.   Neck: No JVD Cardiac:  No gallop or audible rub Respiratory: Clear to auscultation bilaterally. GI: Soft, nontender, non-distended  MS: No edema; No deformity. Neuro:  Nonfocal  Psych: Normal affect   Labs    High Sensitivity Troponin:   Recent Labs  Lab 06/19/20 0740 06/19/20 0943 06/19/20 1636  06/19/20 2158  TROPONINIHS 2,414* 1,778* 2,148* 1,715*      Chemistry Recent Labs  Lab 06/19/20 0213 06/20/20 0258  NA 138 139  K 3.7 4.6  CL 101 104  CO2 23 28  GLUCOSE 124* 77  BUN 14 20  CREATININE 2.61* 1.52*  CALCIUM 9.8 8.8*  PROT 8.2* 6.6  ALBUMIN 4.6 3.4*  AST 39 43*  ALT 35 34  ALKPHOS 79 67  BILITOT 0.5 0.8  GFRNONAA 30* 59*  GFRAA 35* >60  ANIONGAP 14 7     Hematology Recent Labs  Lab 06/19/20 0213 06/19/20 1636 06/20/20 0258  WBC 19.7* 12.9* 11.6*  RBC 4.75 4.66 4.58  HGB 14.8 14.1 13.7  HCT 45.3 43.4 43.0  MCV 95.4 93.1 93.9  MCH 31.2 30.3 29.9  MCHC 32.7 32.5 31.9  RDW 13.9 13.7 13.6  PLT 300 235 240    BNPNo results for input(s): BNP, PROBNP in the last 168 hours.   DDimer No results for input(s): DDIMER in the last 168 hours.   Radiology    CT Abdomen Pelvis Wo Contrast  Result Date: 06/19/2020 CLINICAL DATA:  Abdominal pain, flank pain, vomiting, acute renal failure EXAM: CT ABDOMEN AND PELVIS WITHOUT CONTRAST TECHNIQUE: Multidetector CT imaging of  the abdomen and pelvis was performed following the standard protocol without IV contrast. COMPARISON:  05/30/2015 FINDINGS: Lower chest: No acute abnormality. Hepatobiliary: No solid liver abnormality is seen. No gallstones, gallbladder wall thickening, or biliary dilatation. Pancreas: Unremarkable. No pancreatic ductal dilatation or surrounding inflammatory changes. Spleen: Normal in size without significant abnormality. Adrenals/Urinary Tract: Adrenal glands are unremarkable. Nonobstructive calculus of the inferior pole of the right kidney. No hydronephrosis. Bladder is unremarkable. Stomach/Bowel: Stomach is within normal limits. Appendix is surgically absent. No evidence of bowel wall thickening, distention, or inflammatory changes. Vascular/Lymphatic: No significant vascular findings are present. No enlarged abdominal or pelvic lymph nodes. Reproductive: No mass or other significant abnormality.  Other: No abdominal wall hernia or abnormality. No abdominopelvic ascites. Musculoskeletal: No acute or significant osseous findings. IMPRESSION: 1. No acute non-contrast CT findings of the abdomen or pelvis to explain abdominal pain or vomiting. 2. Nonobstructive right nephrolithiasis. No hydronephrosis. Electronically Signed   By: Eddie Candle M.D.   On: 06/19/2020 09:14   DG Chest 2 View  Result Date: 06/19/2020 CLINICAL DATA:  Left-sided chest pain EXAM: CHEST - 2 VIEW COMPARISON:  04/22/2020 FINDINGS: The heart size and mediastinal contours are within normal limits. Both lungs are clear. The visualized skeletal structures are unremarkable. IMPRESSION: No active cardiopulmonary disease. Electronically Signed   By: Davina Poke D.O.   On: 06/19/2020 08:13   US RENAL  Result Date: 06/19/2020 CLINICAL DATA:  AKI EXAM: RENAL / URINARY TRACT ULTRASOUND COMPLETE COMPARISON:  None. FINDINGS: Right Kidney: Renal measurements: 10.7 x 4.2 x 5.6 cm = volume: 131.3 mL. Echogenicity within normal limits. No mass or hydronephrosis visualized. Left Kidney: Renal measurements: 9.7 x 4.8 x 4.9 cm = volume: 119.3 mL. Echogenicity within normal limits. No mass or hydronephrosis visualized. Bladder: Appears normal for degree of bladder distention. Other: None. IMPRESSION: Normal renal ultrasound. Electronically Signed   By: Macy Mis M.D.   On: 06/19/2020 13:20   ECHOCARDIOGRAM COMPLETE  Result Date: 06/19/2020    ECHOCARDIOGRAM REPORT   Patient Name:   Brian [name] Date of Exam: 06/19/2020 Medical Rec #:  [id number]          Height:       69.0 in Accession #:    [id number]         Weight:       165.0 lb Date of Birth:  [birth year]          BSA:          1.904 m Patient Age:    34 years           BP:           125/85 mmHg Patient Gender: M                  HR:           82 bpm. Exam Location:  Inpatient Procedure: 2D Echo, Cardiac Doppler and Color Doppler Indications:    Chest Pain 786.50 / R07.9  History:         Patient has prior history of Echocardiogram examinations, most                 recent 04/06/2018.  Sonographer:    Bernadene Person RDCS Referring Phys: [id number] [facility] NARENDRA  Sonographer Comments: Technically difficult due to not being able to stay still. IMPRESSIONS  1. Hypokinesis of the basal to mid inferior, anteroseptal and inferoseptal myocardium. Left ventricular ejection fraction, by estimation, is 45 to  50%. The left ventricle has mildly decreased function. The left ventricle has no regional wall motion abnormalities. There is mild concentric left ventricular hypertrophy. Left ventricular diastolic parameters are consistent with Grade I diastolic dysfunction (impaired relaxation).  2. Right ventricular systolic function is normal. The right ventricular size is normal. There is normal pulmonary artery systolic pressure.  3. The mitral valve is normal in structure. Trivial mitral valve regurgitation. No evidence of mitral stenosis.  4. The aortic valve is tricuspid. Aortic valve regurgitation is not visualized. No aortic stenosis is present.  5. The inferior vena cava is normal in size with <50% respiratory variability, suggesting right atrial pressure of 8 mmHg. FINDINGS  Left Ventricle: Hypokinesis of the basal to mid inferior, anteroseptal and inferoseptal myocardium. Left ventricular ejection fraction, by estimation, is 45 to 50%. The left ventricle has mildly decreased function. The left ventricle has no regional wall motion abnormalities. The left ventricular internal cavity size was normal in size. There is mild concentric left ventricular hypertrophy. Left ventricular diastolic parameters are consistent with Grade I diastolic dysfunction (impaired relaxation).  Normal left ventricular filling pressure. Right Ventricle: The right ventricular size is normal. No increase in right ventricular wall thickness. Right ventricular systolic function is normal. There is normal pulmonary artery systolic  pressure. The tricuspid regurgitant velocity is 1.13 m/s, and  with an assumed right atrial pressure of 8 mmHg, the estimated right ventricular systolic pressure is 16.1 mmHg. Left Atrium: Left atrial size was normal in size. Right Atrium: Right atrial size was normal in size. Pericardium: There is no evidence of pericardial effusion. Mitral Valve: The mitral valve is normal in structure. Normal mobility of the mitral valve leaflets. Trivial mitral valve regurgitation. No evidence of mitral valve stenosis. Tricuspid Valve: The tricuspid valve is normal in structure. Tricuspid valve regurgitation is trivial. No evidence of tricuspid stenosis. Aortic Valve: The aortic valve is tricuspid. Aortic valve regurgitation is not visualized. No aortic stenosis is present. Pulmonic Valve: The pulmonic valve was normal in structure. Pulmonic valve regurgitation is not visualized. No evidence of pulmonic stenosis. Aorta: The aortic root is normal in size and structure. Venous: The inferior vena cava is normal in size with less than 50% respiratory variability, suggesting right atrial pressure of 8 mmHg. IAS/Shunts: No atrial level shunt detected by color flow Doppler.  LEFT VENTRICLE PLAX 2D LVIDd:         4.62 cm  Diastology LVIDs:         3.45 cm  LV e' lateral:   8.24 cm/s LV PW:         1.21 cm  LV E/e' lateral: 5.0 LV IVS:        1.07 cm  LV e' medial:    5.70 cm/s LVOT diam:     2.10 cm  LV E/e' medial:  7.2 LV SV:         59 LV SV Index:   31 LVOT Area:     3.46 cm  RIGHT VENTRICLE RV S prime:     8.94 cm/s TAPSE (M-mode): 1.4 cm LEFT ATRIUM             Index       RIGHT ATRIUM           Index LA diam:        2.40 cm 1.26 cm/m  RA Area:     14.90 cm LA Vol (A2C):   37.2 ml 19.54 ml/m RA Volume:   37.00 ml  19.43 ml/m LA Vol (A4C):   30.6 ml 16.07 ml/m LA Biplane Vol: 35.1 ml 18.44 ml/m  AORTIC VALVE LVOT Vmean:  78.400 cm/s LVOT VTI:    0.171 m  AORTA Ao Root diam: 3.10 cm Ao Asc diam:  3.20 cm MITRAL VALVE                TRICUSPID VALVE MV Area (PHT): 3.85 cm    TR Peak grad:   5.1 mmHg MV Decel Time: 197 msec    TR Vmax:        113.00 cm/s MV E velocity: 41.10 cm/s MV A velocity: 57.90 cm/s  SHUNTS MV E/A ratio:  0.71        Systemic VTI:  0.17 m                            Systemic Diam: 2.10 cm Skeet Latch MD Electronically signed by Skeet Latch MD Signature Date/Time: 06/19/2020/4:28:34 PM    Final     Cardiac Studies   Echocardiogram as noted above reveals mild reduction in LV function with regional wall motion abnormality. EF 40 to 45%.  Patient Profile     34 y.o. male with a hx of polysubstance abuse, (methamphetamine, cocaine, heroin), prior IVDU, hepatitis C, cirrhosis, GERD, prior homelessness, malingering, bipolar disorder, current smoker, and alcohol abuse who is being seen today for the evaluation of elevated troponin  Assessment & Plan    1. Non-ST elevation MI: Etiology is uncertain. Rule out plaque rupture, MINOCA, coronary artery dissection, Takotsubo or other cause of acute ischemia. Also possible that this could be inflammatory. Recommend coronary angiography when kidney function is stable. Possibly as early as tomorrow. Left ventriculography will not be needed. Contrast minimization would be a priority. Troponins are persistently elevated. This could be related to kidney disease. 2. 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 Continue hydration.     For questions or updates, please contact [place] Please consult [email or web address] for contact info under        Signed, Sinclair Grooms, MD  06/20/2020, 9:02 AM

## 2020-06-20 NOTE — Progress Notes (Signed)
[place] for Heparin Indication: chest pain/ACS  Allergies  Allergen Reactions  . Fish Allergy Anaphylaxis  . Shellfish Allergy Anaphylaxis    Patient Measurements: Height: 5\' 8"  (172.7 cm) Weight: 74.5 kg (164 lb 4.8 oz) IBW/kg (Calculated) : 68.4 Heparin Dosing Weight: 71.1 kg  Vital Signs: Temp: 98.1 F (36.7 C) (08/25 1134) Temp Source: Oral (08/25 1134) BP: 111/84 (08/25 1134) Pulse Rate: 96 (08/25 1134)  Labs: Recent Labs    06/19/20 0213 06/19/20 0213 06/19/20 0740 06/19/20 0740 06/19/20 0943 06/19/20 1636 06/19/20 1636 06/19/20 1925 06/19/20 2158 06/20/20 0258 06/20/20 1022  HGB 14.8   < >  --   --   --  14.1  --   --   --  13.7  --   HCT 45.3  --   --   --   --  43.4  --   --   --  43.0  --   PLT 300  --   --   --   --  235  --   --   --  240  --   HEPARINUNFRC  --   --   --   --   --  0.15*   < > <0.10*  --  0.58 0.26*  CREATININE 2.61*  --   --   --   --   --   --   --   --  1.52*  --   CKTOTAL  --   --  909*  --   --  1,022*  --   --   --  767*  --   TROPONINIHS  --   --  2,414*   < > 1,778* 2,148*  --   --  1,715*  --   --    < > = values in this interval not displayed.    Estimated Creatinine Clearance: 65.6 mL/min (A) (by C-G formula based on SCr of 1.52 mg/dL (H)).   Medical History: Past Medical History:  Diagnosis Date  . Alcohol abuse   . Anxiety   . Cocaine abuse ([place])   . Depression   . Kidney stones   . Narcotic abuse ([place])   . Peptic ulcer   . Polysubstance abuse ([place])   . Renal disorder    kidney stones    Assessment: 33 YOM who presented on 8/24 with N/V, CP and noted to have elevated troponins concerning for ACS. Pharmacy consulted to start heparin for anticoagulation.   The patient was not noted to be on any anticoagulation PTA. CBC is wnl. Noted AKI.  Heparin level subtherapeutic after restart at 0.26  Goal of Therapy:  Heparin level 0.3-0.7 units/ml Monitor platelets by  anticoagulation protocol: Yes   Plan:  Increase heparin to 130 units.h Recheck heparin level with daily labs   Arrie Senate, PharmD, BCPS Clinical Pharmacist [phone] Please check AMION for all [place] numbers 06/20/2020

## 2020-06-20 NOTE — Progress Notes (Signed)
   Subjective:  Patient seen at bedside this AM. Continues to endorse belching, R sided abdominal pain. Discussed amphetamine use, says he has tried quitting multiple times in the past. Discussed plan, including possible cath.  Objective:  Vital signs in last 24 hours: Vitals:   06/19/20 1939 06/19/20 2355 06/20/20 0325 06/20/20 0333  BP: 113/72 (!) 97/58  111/77  Pulse: (!) 101 82  91  Resp: 20 16  17   Temp: 98.1 F (36.7 C) 98.5 F (36.9 C)  (!) 97.5 F (36.4 C)  TempSrc: Oral Oral  Oral  SpO2: 99% 99%  98%  Weight:   74.5 kg   Height:       Physical Exam: General: Sitting up in the bed, no acute distress, belching frequently CV: Regular rate, rhythm. No m/r/g Pulm: CTAB Abdomen: Soft, non-distended MSK: No edema bilaterally  Assessment/Plan:  Active Problems:   AKI (acute kidney injury) (HCC)   Opioid use disorder, severe, dependence (HCC)   Bipolar II disorder, severe, depressed, with anxious distress (HCC)   Amphetamine use disorder, severe, dependence (HCC)   Substance induced mood disorder (HCC)   Chest pain  Mr. Brian [name] is 34yo male with polysubstance use disorder (most recently amphetamines), GERD, hepatitis C, cirrhosis, bipolar 2 disorder admitted for NSTEMI and acute kidney injury.  #NSTEMI, Type I v II Patient presenting with epigastric discomfort, GERD unrelieved by antacids. Denies CP, SOB, dizziness, orthopnea. On arrival, leukocytosis, troponin elevated 2400>1800>2100>1700. TTE revealed regional wall motion abnormalities, EF 06-23%, grade 1 diastolic dysfunction. Multiple etiologies for NSTEMI, including methamphetamine use, CAD, inflammatory. Patient started on heparin gtt in ED. Cards following, appreciate recs. Patient not deemed candidate for coronary angiography d/t kidney function. Given improvement of kidney function overnight s/p IVF, cath possibly tomorrow v Friday. -Heparin gtt (48hr - started 8/24) -ASA 81 qd, Lipitor 80 qd -Tele -?Cath  tomorrow v Friday -Appreciate cards recs  #Acute kidney injury Kidney function improving overnight, Cr 1.52 < 2.61 (BL 1.2). Likely AKI pre-renal, patient has had improvement s/p IVF. CK also improving, 1000>700. Continue IVF, monitor -LR 125/hr -Daily BMP  #Non-obstructing R nephrolithiasis Patient continues to endorse some R-sided pain, continue with IVF and pain regimen. -IVF, oxycodone 5mg  q6  #Polysubstance use disorder (most recent amphetamime) #Homelessness This AM patient endorsed frustration over recent relapse in amphetamine use. Mentions prior attempts at cessation have failed, but willing to discuss with CSW for resources. -TOC -COWS, CIWA q4  Diet: HH IVF: LR 125/hr Bowel: Miralaax DVT PPX: heparin gtt Code Status: Full Prior to Admission Living Arrangement: unknown Anticipated Discharge Location: unknown Barriers to Discharge: medical management Dispo: Anticipated discharge in approximately >2 day(s).   Sanjuan Dame, MD 06/20/2020, 5:13 AM Pager: [phone] After 5pm on weekdays and 1pm on weekends: On Call pager [phone]

## 2020-06-20 NOTE — Progress Notes (Signed)
Date: 06/20/2020  Patient name: Brian [name]  Medical record number: [id number]  Date of birth: 10/26/1986   I have seen and evaluated Brian [name] and discussed their care with the Residency Team.  In brief, patient is a 34 year old male with past medical history of amphetamine use, GERD, hepatitis C, cirrhosis, prior IV drug use, bipolar 2 disorder, alcohol use disorder, cocaine use, nephrolithiasis, opioid use disorder and suicidal ideation who presented to the ED with right-sided flank pain associated with nausea and vomiting over the last couple of days.  Patient states that over the last 3 months he has noted intermittent epigastric discomfort attributed to his chronic GERD and belching.  Epigastric pain is nonradiating, sharp without any aggravating or relieving factors.  Patient states that he was taking Tums/Pepcid/Mylanta at home without any relief.  Patient states that pain had worsened over the last week or so and he came to the ED for further evaluation.  Patient also complains of pain over his right flank and intermittent dysuria over the last 3 months.  No hematuria, no increased urinary frequency.  Right flank pain is sharp in nature and radiates to his right groin.  Over the last week or so he has noted intermittent nausea and vomiting with nonbloody/nonbilious emesis.  Today, patient states that he has persistent epigastric discomfort and belching as well as right-sided abdominal pain.  PMHx, Fam Hx, and/or Soc Hx : As per resident admit note  Vitals:   06/20/20 0333 06/20/20 0917  BP: 111/77 104/82  Pulse: 91 88  Resp: 17 20  Temp: (!) 97.5 F (36.4 C) 98.6 F (37 C)  SpO2: 98% 99%   General: Awake, alert, oriented x3, NAD CVs: Regular rhythm, normal heart sounds Lungs: CTA bilaterally Abdomen: Soft, nontender, nondistended, normoactive bowel sounds Extremities: No edema noted, nontender to palpation Psych: Normal mood and affect HEENT: Normocephalic,  atraumatic Skin: Warm and dry   Assessment and Plan: I have seen and evaluated the patient as outlined above. I agree with the formulated Assessment and Plan as detailed in the residents' note, with the following changes:   1.  NSTEMI: -Patient presented to the ED with intermittent chest pain and left arm numbness over the last week as well as epigastric discomfort over the last 3 months.  He was found to have an elevated troponin of up to 2400 on admission as well as regional wall motion abnormalities on his echocardiogram with a mild reduced EF of 45 to 50%.  The etiology behind his non-ST elevation MI remains uncertain at this time.  It is possible that this could be drug-induced (by methamphetamine), ischemic in nature or secondary to coronary artery dissection. -Cardiology follow-up and recommendations appreciated -Coronary angiogram was deferred secondary to AKI on admission.  Patient's creatinine has improved significantly from 2.6-1.5.  Etiology behind his AKI is likely prerenal in nature. -Continue with IV fluids for now and repeat BMP in a.m. -Continue with heparin drip for 48 hours for non-ST elevation MI -Continue to monitor the patient on telemetry -Given patient's creatinine is improving I suspect he should be stable for a coronary angiogram possibly as early as tomorrow.  We will monitor closely -Continue with aspirin and high intensity statin -No further work-up at this time  2.  Nonobstructing right nephrolithiasis: -Patient complained of intermittent dysuria and right-sided flank pain over the last 3 months.  On imaging here he was noted to have a nonobstructing right kidney stone. -UA without any evidence of infection -  We will continue with IV fluids and pain control for now -No further work-up at this time  Aldine Contes, MD 8/25/202111:33 AM

## 2020-06-20 NOTE — Progress Notes (Signed)
ANTICOAGULATION CONSULT NOTE - Follow Up Consult  Pharmacy Consult for heparin Indication: chest pain/ACS  Labs: Recent Labs    06/19/20 0213 06/19/20 0740 06/19/20 0740 06/19/20 0943 06/19/20 1636 06/19/20 1925 06/19/20 2158 06/20/20 0258  HGB 14.8  --   --   --  14.1  --   --   --   HCT 45.3  --   --   --  43.4  --   --   --   PLT 300  --   --   --  235  --   --   --   HEPARINUNFRC  --   --   --   --  0.15* <0.10*  --  0.58  CREATININE 2.61*  --   --   --   --   --   --   --   CKTOTAL  --  909*  --   --  1,022*  --   --   --   TROPONINIHS  --  2,414*   < > 1,778* 2,148*  --  1,715*  --    < > = values in this interval not displayed.    Assessment/Plan:  34yo male therapeutic on heparin after rate change. Will continue gtt at current rate and confirm stable with additional level.   Wynona Neat, PharmD, BCPS  06/20/2020,3:28 AM

## 2020-06-20 NOTE — Hospital Course (Addendum)
Reports that he feels he is in a psychosis. Reports that Cardiology will take him to a cath, timing is unknown.

## 2020-06-21 ENCOUNTER — Ambulatory Visit (HOSPITAL_COMMUNITY): Payer: Self-pay | Admitting: Psychiatry

## 2020-06-21 ENCOUNTER — Encounter (HOSPITAL_COMMUNITY): Payer: Self-pay | Admitting: Internal Medicine

## 2020-06-21 ENCOUNTER — Encounter (HOSPITAL_COMMUNITY)
Admission: EM | Disposition: A | Discharge status: Home / Self Care | Payer: Self-pay | Source: Home / Self Care | Attending: Internal Medicine

## 2020-06-21 DIAGNOSIS — R778 Other specified abnormalities of plasma proteins: Secondary | ICD-10-CM

## 2020-06-21 DIAGNOSIS — I214 Non-ST elevation (NSTEMI) myocardial infarction: Secondary | ICD-10-CM

## 2020-06-21 DIAGNOSIS — I2510 Atherosclerotic heart disease of native coronary artery without angina pectoris: Secondary | ICD-10-CM

## 2020-06-21 DIAGNOSIS — F1120 Opioid dependence, uncomplicated: Secondary | ICD-10-CM

## 2020-06-21 HISTORY — PX: LEFT HEART CATH AND CORONARY ANGIOGRAPHY: CATH118249

## 2020-06-21 LAB — BASIC METABOLIC PANEL
Anion gap: 9 (ref 5–15)
BUN: 9 mg/dL (ref 6–20)
CO2: 26 mmol/L (ref 22–32)
Calcium: 8.7 mg/dL — ABNORMAL LOW (ref 8.9–10.3)
Chloride: 104 mmol/L (ref 98–111)
Creatinine, Ser: 1.12 mg/dL (ref 0.61–1.24)
GFR calc Af Amer: >60 mL/min (ref >60)
GFR calc non Af Amer: >60 mL/min (ref >60)
Glucose, Bld: 100 mg/dL — ABNORMAL HIGH (ref 70–99)
Potassium: 4.1 mmol/L (ref 3.5–5.1)
Sodium: 139 mmol/L (ref 135–145)

## 2020-06-21 LAB — CBC
HCT: 40.6 % (ref 39.0–52.0)
Hemoglobin: 12.6 g/dL — ABNORMAL LOW (ref 13.0–17.0)
MCH: 30 pg (ref 26.0–34.0)
MCHC: 31 g/dL (ref 30.0–36.0)
MCV: 96.7 fL (ref 80.0–100.0)
Platelets: 225 10*3/uL (ref 150–400)
RBC: 4.2 MIL/uL — ABNORMAL LOW (ref 4.22–5.81)
RDW: 13.9 % (ref 11.5–15.5)
WBC: 7.9 10*3/uL (ref 4.0–10.5)
nRBC: 0 % (ref 0.0–0.2)

## 2020-06-21 LAB — HEPARIN LEVEL (UNFRACTIONATED): Heparin Unfractionated: 0.51 IU/mL (ref 0.30–0.70)

## 2020-06-21 SURGERY — LEFT HEART CATH AND CORONARY ANGIOGRAPHY
Anesthesia: LOCAL

## 2020-06-21 MED ORDER — VERAPAMIL HCL 2.5 MG/ML IV SOLN
INTRAVENOUS | Status: AC
  Filled 2020-06-21: qty 2

## 2020-06-21 MED ORDER — ENOXAPARIN SODIUM 40 MG/0.4ML ~~LOC~~ SOLN: 40.0000 mg | SUBCUTANEOUS | Status: DC

## 2020-06-21 MED ORDER — SODIUM CHLORIDE 0.9% FLUSH: 3.0000 mL | Freq: Two times a day (BID) | INTRAVENOUS | Status: DC

## 2020-06-21 MED ORDER — SODIUM CHLORIDE 0.9% FLUSH: 3.0000 mL | INTRAVENOUS | Status: DC | PRN

## 2020-06-21 MED ORDER — HEPARIN SODIUM (PORCINE) 1000 UNIT/ML IJ SOLN
INTRAMUSCULAR | Status: AC
  Filled 2020-06-21: qty 1

## 2020-06-21 MED ORDER — SODIUM CHLORIDE 0.9 % IV SOLN: 250.0000 mL | INTRAVENOUS | Status: DC | PRN

## 2020-06-21 MED ORDER — MIDAZOLAM HCL 2 MG/2ML IJ SOLN
INTRAMUSCULAR | Status: DC | PRN
  Administered 2020-06-21 (×2): 1 mg via INTRAVENOUS

## 2020-06-21 MED ORDER — PANTOPRAZOLE SODIUM 40 MG PO TBEC: 40.0000 mg | DELAYED_RELEASE_TABLET | Freq: Every day | ORAL | 0 refills | Status: DC

## 2020-06-21 MED ORDER — HEPARIN (PORCINE) IN NACL 1000-0.9 UT/500ML-% IV SOLN
INTRAVENOUS | Status: DC | PRN
  Administered 2020-06-21 (×2): 500 mL

## 2020-06-21 MED ORDER — HYDRALAZINE HCL 20 MG/ML IJ SOLN: 10.0000 mg | INTRAMUSCULAR | Status: DC | PRN

## 2020-06-21 MED ORDER — SODIUM CHLORIDE 0.9 % WEIGHT BASED INFUSION: 1.0000 mL/kg/h | INTRAVENOUS | Status: DC

## 2020-06-21 MED ORDER — ASPIRIN 81 MG PO CHEW: 81.0000 mg | CHEWABLE_TABLET | ORAL | Status: DC

## 2020-06-21 MED ORDER — HEPARIN SODIUM (PORCINE) 1000 UNIT/ML IJ SOLN
INTRAMUSCULAR | Status: DC | PRN
  Administered 2020-06-21: 4000 [IU] via INTRAVENOUS

## 2020-06-21 MED ORDER — LIDOCAINE HCL (PF) 1 % IJ SOLN
INTRAMUSCULAR | Status: DC | PRN
  Administered 2020-06-21: 2 mL

## 2020-06-21 MED ORDER — FENTANYL CITRATE (PF) 100 MCG/2ML IJ SOLN
INTRAMUSCULAR | Status: AC
  Filled 2020-06-21: qty 2

## 2020-06-21 MED ORDER — IOHEXOL 350 MG/ML SOLN
INTRAVENOUS | Status: DC | PRN
  Administered 2020-06-21: 55 mL

## 2020-06-21 MED ORDER — MIDAZOLAM HCL 2 MG/2ML IJ SOLN
INTRAMUSCULAR | Status: AC
  Filled 2020-06-21: qty 2

## 2020-06-21 MED ORDER — FENTANYL CITRATE (PF) 100 MCG/2ML IJ SOLN
INTRAMUSCULAR | Status: DC | PRN
  Administered 2020-06-21: 25 ug via INTRAVENOUS

## 2020-06-21 MED ORDER — HEPARIN (PORCINE) 25000 UT/250ML-% IV SOLN: 1300.0000 [IU]/h | INTRAVENOUS | Status: DC

## 2020-06-21 MED ORDER — SODIUM CHLORIDE 0.9 % WEIGHT BASED INFUSION
3.0000 mL/kg/h | INTRAVENOUS | Status: DC
  Administered 2020-06-21: 3 mL/kg/h via INTRAVENOUS

## 2020-06-21 MED ORDER — ATORVASTATIN CALCIUM 80 MG PO TABS: 80.0000 mg | ORAL_TABLET | Freq: Every day | ORAL | 0 refills | Status: DC

## 2020-06-21 MED ORDER — SODIUM CHLORIDE 0.9 % IV SOLN
INTRAVENOUS | Status: AC | PRN
  Administered 2020-06-21: 250 mL via INTRAVENOUS

## 2020-06-21 MED ORDER — NITROGLYCERIN 1 MG/10 ML FOR IR/CATH LAB
INTRA_ARTERIAL | Status: DC | PRN
  Administered 2020-06-21: 200 ug via INTRACORONARY

## 2020-06-21 MED ORDER — CLOPIDOGREL BISULFATE 75 MG PO TABS
75.0000 mg | ORAL_TABLET | Freq: Every day | ORAL | Status: DC
  Administered 2020-06-21: 75 mg via ORAL
  Filled 2020-06-21: qty 1

## 2020-06-21 MED ORDER — HEPARIN (PORCINE) IN NACL 1000-0.9 UT/500ML-% IV SOLN
INTRAVENOUS | Status: AC
  Filled 2020-06-21: qty 500

## 2020-06-21 MED ORDER — ASPIRIN 81 MG PO CHEW: 81.0000 mg | CHEWABLE_TABLET | Freq: Every day | ORAL | 0 refills | Status: AC

## 2020-06-21 MED ORDER — SODIUM CHLORIDE 0.9 % IV SOLN: INTRAVENOUS | Status: AC

## 2020-06-21 MED ORDER — CLOPIDOGREL BISULFATE 75 MG PO TABS: 75.0000 mg | ORAL_TABLET | Freq: Every day | ORAL | 0 refills | Status: AC

## 2020-06-21 MED ORDER — VERAPAMIL HCL 2.5 MG/ML IV SOLN
INTRAVENOUS | Status: DC | PRN
  Administered 2020-06-21: 10 mL via INTRA_ARTERIAL

## 2020-06-21 MED FILL — PANTOPRAZOLE SOD DR 40 MG T: 40 | 30 days supply | Qty: 30 | Fill #0

## 2020-06-21 MED FILL — ASPIRIN LOW DOSE 81 MG CHEW: 81 | 30 days supply | Qty: 30 | Fill #0

## 2020-06-21 MED FILL — CLOPIDOGREL 75 MG TABLET: 75 | 30 days supply | Qty: 30 | Fill #0

## 2020-06-21 MED FILL — ATORVASTATIN CALCIUM 80 MG: 80 | 30 days supply | Qty: 30 | Fill #0

## 2020-06-21 SURGICAL SUPPLY — 9 items

## 2020-06-21 NOTE — Progress Notes (Signed)
   No obstructive CAD.  No further cardiac w/u needed.  Avoid substance exposure.  Please call if we may help.

## 2020-06-21 NOTE — Progress Notes (Signed)
Cath Lab called for patient,obtained consent report given.

## 2020-06-21 NOTE — Plan of Care (Signed)

## 2020-06-21 NOTE — Progress Notes (Signed)
[place] for Heparin Indication: chest pain/ACS  Allergies  Allergen Reactions  . Fish Allergy Anaphylaxis  . Shellfish Allergy Anaphylaxis    Patient Measurements: Height: 5\' 8"  (172.7 cm) Weight: 76 kg (167 lb 8 oz) IBW/kg (Calculated) : 68.4 Heparin Dosing Weight: 71.1 kg  Vital Signs: Temp: 97.7 F (36.5 C) (08/26 0442) Temp Source: Oral (08/26 0442) BP: 96/60 (08/26 0442) Pulse Rate: 85 (08/26 0442)  Labs: Recent Labs    06/19/20 0213 06/19/20 5456 06/19/20 0740 06/19/20 0740 06/19/20 2563 06/19/20 1636 06/19/20 1925 06/19/20 2158 06/20/20 0258 06/20/20 1022 06/21/20 0657  HGB 14.8   < >  --   --   --  14.1   < >  --  13.7  --  12.6*  HCT 45.3   < >  --   --   --  43.4  --   --  43.0  --  40.6  PLT 300   < >  --   --   --  235  --   --  240  --  225  HEPARINUNFRC  --   --   --   --   --  0.15*   < >  --  0.58 0.26* 0.51  CREATININE 2.61*  --   --   --   --   --   --   --  1.52*  --  1.12  CKTOTAL  --   --  909*  --   --  1,022*  --   --  767*  --   --   TROPONINIHS  --   --  2,414*   < > 1,778* 2,148*  --  1,715*  --   --   --    < > = values in this interval not displayed.    Estimated Creatinine Clearance: 89.1 mL/min (by C-G formula based on SCr of 1.12 mg/dL).   Medical History: Past Medical History:  Diagnosis Date  . Alcohol abuse   . Anxiety   . Cocaine abuse ([place])   . Depression   . Kidney stones   . Narcotic abuse ([place])   . Peptic ulcer   . Polysubstance abuse ([place])   . Renal disorder    kidney stones    Assessment: 34 YOM who presented on 8/24 with N/V, CP and noted to have elevated troponins concerning for ACS. Pharmacy consulted to start heparin for anticoagulation.   The patient was not noted to be on any anticoagulation PTA. CBC: Hb at bottom end of range at 12.6, plt stable in 200s. SCr improved to 1.12.  Heparin level at goal at 0.51.   Goal of Therapy:  Heparin level 0.3-0.7  units/ml Monitor platelets by anticoagulation protocol: Yes   Plan:  Continue heparin at 1300 units/hr. Recheck heparin level with daily labs   Corky Crafts PharmD Candidate

## 2020-06-21 NOTE — H&P (View-Only) (Signed)
The patient has been seen in conjunction with Vin Baghat, PAC. All aspects of care have been considered and discussed. The patient has been personally interviewed, examined, and all clinical data has been reviewed.   Discussion with patient concerning coronary angiography.  He is very anxious about having the procedure done.  He thought we were discussing coronary bypass surgery.  Radial approach coronary angiography and risks were discussed with the patient.  The procedure in context of his clinical presentation is important since he continues to have chest discomfort.  After some discussion, he is willing to proceed.  The patient was counseled to undergo left heart catheterization, coronary angiography, and possible percutaneous coronary intervention with stent implantation. The procedural risks and benefits were discussed in detail. The risks discussed included death, stroke, myocardial infarction, life-threatening bleeding, limb ischemia, kidney injury, allergy, and possible emergency cardiac surgery. The risk of these significant complications were estimated to occur less than 1% of the time. After discussion, the patient has agreed to proceed.    Progress Note  Patient Name: Brian [name] Date of Encounter: 06/21/2020  [facility] Cardiologist: Sinclair Grooms, MD   Subjective   States "I am hurting all over". He think he does not want cardiac cath.   Inpatient Medications    Scheduled Meds: . aspirin  81 mg Oral Daily  . atorvastatin  80 mg Oral Daily  . baclofen  5 mg Oral TID  . buPROPion  150 mg Oral Daily  . folic acid  1 mg Oral Daily  . mirtazapine  7.5 mg Oral QHS  . nicotine  21 mg Transdermal Daily  . pantoprazole  40 mg Oral Daily  . QUEtiapine  25 mg Oral TID  . thiamine  100 mg Oral Daily   Continuous Infusions: . heparin    . lactated ringers 125 mL/hr at 06/20/20 2142   PRN Meds: alum & mag hydroxide-simeth, oxyCODONE, polyethylene glycol    Vital Signs    Vitals:   06/20/20 1134 06/20/20 2043 06/21/20 0000 06/21/20 0442  BP: 111/84 104/67  96/60  Pulse: 96 99  85  Resp: 16 17 17 17   Temp: 98.1 F (36.7 C) 98.4 F (36.9 C)  97.7 F (36.5 C)  TempSrc: Oral Oral  Oral  SpO2: 96% 99%  100%  Weight:    76 kg  Height:        Intake/Output Summary (Last 24 hours) at 06/21/2020 0814 Last data filed at 06/21/2020 0500 Gross per 24 hour  Intake 2395.76 ml  Output --  Net 2395.76 ml   Last 3 Weights 06/21/2020 06/20/2020 06/19/2020  Weight (lbs) 167 lb 8 oz 164 lb 4.8 oz 156 lb 12.8 oz  Weight (kg) 75.978 kg 74.526 kg 71.124 kg  Some encounter information is confidential and restricted. Go to Review Flowsheets activity to see all data.      Telemetry    Sinus tachycardia - Personally Reviewed  ECG    No new tracing   Physical Exam   Patient resistant to physical exam: Will defer to MD  Labs    High Sensitivity Troponin:   Recent Labs  Lab 06/19/20 0740 06/19/20 0943 06/19/20 1636 06/19/20 2158  TROPONINIHS 2,414* 1,778* 2,148* 1,715*      Chemistry Recent Labs  Lab 06/19/20 0213 06/20/20 0258 06/21/20 0657  NA 138 139 139  K 3.7 4.6 4.1  CL 101 104 104  CO2 23 28 26   GLUCOSE 124* 77 100*  BUN  14 20 9   CREATININE 2.61* 1.52* 1.12  CALCIUM 9.8 8.8* 8.7*  PROT 8.2* 6.6  --   ALBUMIN 4.6 3.4*  --   AST 39 43*  --   ALT 35 34  --   ALKPHOS 79 67  --   BILITOT 0.5 0.8  --   GFRNONAA 30* 59* >60  GFRAA 35* >60 >60  ANIONGAP 14 7 9      Hematology Recent Labs  Lab 06/19/20 1636 06/20/20 0258 06/21/20 0657  WBC 12.9* 11.6* 7.9  RBC 4.66 4.58 4.20*  HGB 14.1 13.7 12.6*  HCT 43.4 43.0 40.6  MCV 93.1 93.9 96.7  MCH 30.3 29.9 30.0  MCHC 32.5 31.9 31.0  RDW 13.7 13.6 13.9  PLT 235 240 225    Radiology    CT Abdomen Pelvis Wo Contrast  Result Date: 06/19/2020 CLINICAL DATA:  Abdominal pain, flank pain, vomiting, acute renal failure EXAM: CT ABDOMEN AND PELVIS WITHOUT CONTRAST  TECHNIQUE: Multidetector CT imaging of the abdomen and pelvis was performed following the standard protocol without IV contrast. COMPARISON:  05/30/2015 FINDINGS: Lower chest: No acute abnormality. Hepatobiliary: No solid liver abnormality is seen. No gallstones, gallbladder wall thickening, or biliary dilatation. Pancreas: Unremarkable. No pancreatic ductal dilatation or surrounding inflammatory changes. Spleen: Normal in size without significant abnormality. Adrenals/Urinary Tract: Adrenal glands are unremarkable. Nonobstructive calculus of the inferior pole of the right kidney. No hydronephrosis. Bladder is unremarkable. Stomach/Bowel: Stomach is within normal limits. Appendix is surgically absent. No evidence of bowel wall thickening, distention, or inflammatory changes. Vascular/Lymphatic: No significant vascular findings are present. No enlarged abdominal or pelvic lymph nodes. Reproductive: No mass or other significant abnormality. Other: No abdominal wall hernia or abnormality. No abdominopelvic ascites. Musculoskeletal: No acute or significant osseous findings. IMPRESSION: 1. No acute non-contrast CT findings of the abdomen or pelvis to explain abdominal pain or vomiting. 2. Nonobstructive right nephrolithiasis. No hydronephrosis. Electronically Signed   By: Eddie Candle M.D.   On: 06/19/2020 09:14   US RENAL  Result Date: 06/19/2020 CLINICAL DATA:  AKI EXAM: RENAL / URINARY TRACT ULTRASOUND COMPLETE COMPARISON:  None. FINDINGS: Right Kidney: Renal measurements: 10.7 x 4.2 x 5.6 cm = volume: 131.3 mL. Echogenicity within normal limits. No mass or hydronephrosis visualized. Left Kidney: Renal measurements: 9.7 x 4.8 x 4.9 cm = volume: 119.3 mL. Echogenicity within normal limits. No mass or hydronephrosis visualized. Bladder: Appears normal for degree of bladder distention. Other: None. IMPRESSION: Normal renal ultrasound. Electronically Signed   By: Macy Mis M.D.   On: 06/19/2020 13:20    ECHOCARDIOGRAM COMPLETE  Result Date: 06/19/2020    ECHOCARDIOGRAM REPORT   Patient Name:   Brian [name] Date of Exam: 06/19/2020 Medical Rec #:  [id number]          Height:       69.0 in Accession #:    [id number]         Weight:       165.0 lb Date of Birth:  [birth year]          BSA:          1.904 m Patient Age:    33 years           BP:           125/85 mmHg Patient Gender: M                  HR:           82 bpm.  Exam Location:  Inpatient Procedure: 2D Echo, Cardiac Doppler and Color Doppler Indications:    Chest Pain 786.50 / R07.9  History:        Patient has prior history of Echocardiogram examinations, most                 recent 04/06/2018.  Sonographer:    Bernadene Person RDCS Referring Phys: [id number] [facility] NARENDRA  Sonographer Comments: Technically difficult due to not being able to stay still. IMPRESSIONS  1. Hypokinesis of the basal to mid inferior, anteroseptal and inferoseptal myocardium. Left ventricular ejection fraction, by estimation, is 45 to 50%. The left ventricle has mildly decreased function. The left ventricle has no regional wall motion abnormalities. There is mild concentric left ventricular hypertrophy. Left ventricular diastolic parameters are consistent with Grade I diastolic dysfunction (impaired relaxation).  2. Right ventricular systolic function is normal. The right ventricular size is normal. There is normal pulmonary artery systolic pressure.  3. The mitral valve is normal in structure. Trivial mitral valve regurgitation. No evidence of mitral stenosis.  4. The aortic valve is tricuspid. Aortic valve regurgitation is not visualized. No aortic stenosis is present.  5. The inferior vena cava is normal in size with <50% respiratory variability, suggesting right atrial pressure of 8 mmHg. FINDINGS  Left Ventricle: Hypokinesis of the basal to mid inferior, anteroseptal and inferoseptal myocardium. Left ventricular ejection fraction, by estimation, is 45 to 50%. The left  ventricle has mildly decreased function. The left ventricle has no regional wall motion abnormalities. The left ventricular internal cavity size was normal in size. There is mild concentric left ventricular hypertrophy. Left ventricular diastolic parameters are consistent with Grade I diastolic dysfunction (impaired relaxation).  Normal left ventricular filling pressure. Right Ventricle: The right ventricular size is normal. No increase in right ventricular wall thickness. Right ventricular systolic function is normal. There is normal pulmonary artery systolic pressure. The tricuspid regurgitant velocity is 1.13 m/s, and  with an assumed right atrial pressure of 8 mmHg, the estimated right ventricular systolic pressure is 19.4 mmHg. Left Atrium: Left atrial size was normal in size. Right Atrium: Right atrial size was normal in size. Pericardium: There is no evidence of pericardial effusion. Mitral Valve: The mitral valve is normal in structure. Normal mobility of the mitral valve leaflets. Trivial mitral valve regurgitation. No evidence of mitral valve stenosis. Tricuspid Valve: The tricuspid valve is normal in structure. Tricuspid valve regurgitation is trivial. No evidence of tricuspid stenosis. Aortic Valve: The aortic valve is tricuspid. Aortic valve regurgitation is not visualized. No aortic stenosis is present. Pulmonic Valve: The pulmonic valve was normal in structure. Pulmonic valve regurgitation is not visualized. No evidence of pulmonic stenosis. Aorta: The aortic root is normal in size and structure. Venous: The inferior vena cava is normal in size with less than 50% respiratory variability, suggesting right atrial pressure of 8 mmHg. IAS/Shunts: No atrial level shunt detected by color flow Doppler.  LEFT VENTRICLE PLAX 2D LVIDd:         4.62 cm  Diastology LVIDs:         3.45 cm  LV e' lateral:   8.24 cm/s LV PW:         1.21 cm  LV E/e' lateral: 5.0 LV IVS:        1.07 cm  LV e' medial:    5.70 cm/s  LVOT diam:     2.10 cm  LV E/e' medial:  7.2 LV SV:  81 LV SV Index:   31 LVOT Area:     3.46 cm  RIGHT VENTRICLE RV S prime:     8.94 cm/s TAPSE (M-mode): 1.4 cm LEFT ATRIUM             Index       RIGHT ATRIUM           Index LA diam:        2.40 cm 1.26 cm/m  RA Area:     14.90 cm LA Vol (A2C):   37.2 ml 19.54 ml/m RA Volume:   37.00 ml  19.43 ml/m LA Vol (A4C):   30.6 ml 16.07 ml/m LA Biplane Vol: 35.1 ml 18.44 ml/m  AORTIC VALVE LVOT Vmean:  78.400 cm/s LVOT VTI:    0.171 m  AORTA Ao Root diam: 3.10 cm Ao Asc diam:  3.20 cm MITRAL VALVE               TRICUSPID VALVE MV Area (PHT): 3.85 cm    TR Peak grad:   5.1 mmHg MV Decel Time: 197 msec    TR Vmax:        113.00 cm/s MV E velocity: 41.10 cm/s MV A velocity: 57.90 cm/s  SHUNTS MV E/A ratio:  0.71        Systemic VTI:  0.17 m                            Systemic Diam: 2.10 cm Skeet Latch MD Electronically signed by Skeet Latch MD Signature Date/Time: 06/19/2020/4:28:34 PM    Final     Cardiac Studies   Echo 06/19/20 1. Hypokinesis of the basal to mid inferior, anteroseptal and  inferoseptal myocardium. Left ventricular ejection fraction, by  estimation, is 45 to 50%. The left ventricle has mildly decreased  function. The left ventricle has no regional wall motion  abnormalities. There is mild concentric left ventricular hypertrophy. Left  ventricular diastolic parameters are consistent with Grade I diastolic  dysfunction (impaired relaxation).  2. Right ventricular systolic function is normal. The right ventricular  size is normal. There is normal pulmonary artery systolic pressure.  3. The mitral valve is normal in structure. Trivial mitral valve  regurgitation. No evidence of mitral stenosis.  4. The aortic valve is tricuspid. Aortic valve regurgitation is not  visualized. No aortic stenosis is present.  5. The inferior vena cava is normal in size with <50% respiratory  variability, suggesting right atrial  pressure of 8 mmHg.   Patient Profile     34 y.o. male with a hx of polysubstance abuse, (methamphetamine, cocaine, heroin), prior IVDU, hepatitis C, cirrhosis, GERD, prior homelessness, malingering, bipolar disorder, current smoker, and alcohol abusewho is being seen today for the evaluation of elevated troponin.  Assessment & Plan    1. NSTEMI - Hs- troponin 2414>1778>2148>1715 - Echo showed LVEF of 45-50% and grade 1 DD - Treated with heparin - Renal function back to normal. Discussed cath however states that "I am unsure to get it done" - Continue ASA and statin   2. Acute on CKD IV - Scr peaked at 2.61. 1.12 today  3. Polysubstance abuse - poor insite   For questions or updates, please contact [place] Please consult [email or web address] for contact info under        SignedLeanor Kail, PA  06/21/2020, 8:14 AM

## 2020-06-21 NOTE — Interval H&P Note (Signed)
History and Physical Interval Note:  06/21/2020 11:09 AM  Brian [name]  has presented today for surgery, with the diagnosis of nonstemi.  The various methods of treatment have been discussed with the patient and family. After consideration of risks, benefits and other options for treatment, the patient has consented to  Procedure(s): LEFT HEART CATH AND CORONARY ANGIOGRAPHY (N/A) as a surgical intervention.  The patient's history has been reviewed, patient examined, no change in status, stable for surgery.  I have reviewed the patient's chart and labs.  Questions were answered to the patient's satisfaction.    Cath Lab Visit (complete for each Cath Lab visit)  Clinical Evaluation Leading to the Procedure:   ACS: Yes.    Non-ACS: N/A  Shanikka Wonders

## 2020-06-21 NOTE — Progress Notes (Signed)
Paged covering intern due to patient being upset about being NPO. He said he was told about a potential procedure. Informed them that the patient had eaten after being told he was NPO. They were also informed about his soft blood pressure. Oncoming provider returned the page. D/c'd NPO and ordered a diet. Plan to come speak with the patient after 8am.

## 2020-06-21 NOTE — TOC Transition Note (Addendum)
Transition of Care [facility]) - CM/SW Discharge Note   Patient Details  Name: HOPE HOLST MRN: [id number] Date of Birth: 10-Mar-1986  Transition of Care [facility]) CM/SW Contact:  Zenon Mayo, RN Phone Number: 06/21/2020, 4:17 PM   Clinical Narrative:    Patient is for dc per MD today, NCM assisted patient with medications thru [employer] ,  CSW gave him bus pass and Part bus pass with [facility].  He has a hospital follow up at the [facility] clinic also.    Final next level of care: Home/Self Care Barriers to Discharge: No Barriers Identified   Patient Goals and CMS Choice        Discharge Placement                       Discharge Plan and Services                                     Social Determinants of Health (SDOH) Interventions     Readmission Risk Interventions No flowsheet data found.

## 2020-06-21 NOTE — Progress Notes (Signed)
TR band removed patient instructed not to remove dressing for 24 hours. Clean dry dressing applied to right wrist area with transparent dressing applied to site. Raquel Sarna in to give patient discharge instructions patient reported would call for his ride.

## 2020-06-21 NOTE — Progress Notes (Signed)
   Subjective:  Patient seen at bedside this AM. States he is anxious regarding procedure, thought he was going to need open heart surgery. Continues to have some epigastric/chest pain. Otherwise no complaints.  Objective:  Vital signs in last 24 hours: Vitals:   06/20/20 1134 06/20/20 2043 06/21/20 0000 06/21/20 0442  BP: 111/84 104/67  96/60  Pulse: 96 99  85  Resp: 16 17 17 17   Temp: 98.1 F (36.7 C) 98.4 F (36.9 C)  97.7 F (36.5 C)  TempSrc: Oral Oral  Oral  SpO2: 96% 99%  100%  Weight:    76 kg  Height:       Physical Exam: General: Sitting up in bed, no acute distress CV: Regular rate, rhythm. No m/r/g Pulm: CTAB Abdomen: Soft, non-tender, non-distended Psych: Anxious, speech coherent but fast  Assessment/Plan:  Active Problems:   AKI (acute kidney injury) ([place])   Opioid use disorder, severe, dependence (HCC)   Bipolar II disorder, severe, depressed, with anxious distress (HCC)   Amphetamine use disorder, severe, dependence (HCC)   Substance induced mood disorder (HCC)   Chest pain  Brian [name] is 34yo male with polysubstance use disorder (most recently amphetamines), GERD, hepatitis C, cirrhosis, bipolar 2 disorder admitted for NSTEMI 2/2 amphetamine-induced v CAD.  #NSTEMI Patient continues to endorse epigastric discomfort, GERD. Leukocytosis resolved. On arrival patient with elevated troponins (2400>1800>2100>1700), TTE w/ WMA, EF 78-67%, grade 1 diastolic dysfunction. ECG w/o ischemic changes. Last use of amphetamines was night before arrival. Likely NSTEMI 2/2 amphetamine use v CAD. Cath today given kidney function back to baseline. Last day of heparin gtt today, continue with antiplatelet and statin. Will discuss with cardiology best regimen upon discharge.  -R/LHC today -Heparin gtt (48hr - started 8/24)  -ASA 81, Lipitor 80mg  qd -Appreciate cards recs -Tele  #Acute kidney injury, resolved This AM kidney function back to BL Cr 1.12 < 1.52 following  IVF. Given improvement following IVF, AKI most likely pre-renal. Will continue to monitor, will need outpatient follow-up with PCP. -Daily BMP  #Non-obstructing R nephrolithiasis Patient did not endorse abdominal pain this AM. Will continue to encourage fluid intake after discharge. -Oxycodone 5mg  q6 prn  #Polysubstance use disorder #Homelessness Patient continues to discuss desire to quit ilicit substances, stating he needs help with recovery. He has had previous attempts at cessation, most recently reports 45d sober prior to a few days ago. -TOC -COWS, CIWA q4  Diet: HH IVF: LR Bowel: Miralax DVT PPX: heparin gtt Code Status: Full Prior to Admission Living Arrangement: unknown Anticipated Discharge Location: unknown Barriers to Discharge: ischemic work-up Dispo: Anticipated discharge in approximately 1-2 day(s).   Sanjuan Dame, MD 06/21/2020, 6:25 AM Pager: [phone] After 5pm on weekdays and 1pm on weekends: On Call pager [phone]

## 2020-06-21 NOTE — Progress Notes (Signed)
D/C instructions given and reviewed. Primary RN OK'd removal of tele and IV. Pt tolerated well.

## 2020-06-21 NOTE — Progress Notes (Addendum)
The patient has been seen in conjunction with Vin Baghat, PAC. All aspects of care have been considered and discussed. The patient has been personally interviewed, examined, and all clinical data has been reviewed.   Discussion with patient concerning coronary angiography.  He is very anxious about having the procedure done.  He thought we were discussing coronary bypass surgery.  Radial approach coronary angiography and risks were discussed with the patient.  The procedure in context of his clinical presentation is important since he continues to have chest discomfort.  After some discussion, he is willing to proceed.  The patient was counseled to undergo left heart catheterization, coronary angiography, and possible percutaneous coronary intervention with stent implantation. The procedural risks and benefits were discussed in detail. The risks discussed included death, stroke, myocardial infarction, life-threatening bleeding, limb ischemia, kidney injury, allergy, and possible emergency cardiac surgery. The risk of these significant complications were estimated to occur less than 1% of the time. After discussion, the patient has agreed to proceed.    Progress Note  Patient Name: Brian [name] Date of Encounter: 06/21/2020  [facility] Cardiologist: Sinclair Grooms, MD   Subjective   States "I am hurting all over". He think he does not want cardiac cath.   Inpatient Medications    Scheduled Meds: . aspirin  81 mg Oral Daily  . atorvastatin  80 mg Oral Daily  . baclofen  5 mg Oral TID  . buPROPion  150 mg Oral Daily  . folic acid  1 mg Oral Daily  . mirtazapine  7.5 mg Oral QHS  . nicotine  21 mg Transdermal Daily  . pantoprazole  40 mg Oral Daily  . QUEtiapine  25 mg Oral TID  . thiamine  100 mg Oral Daily   Continuous Infusions: . heparin    . lactated ringers 125 mL/hr at 06/20/20 2142   PRN Meds: alum & mag hydroxide-simeth, oxyCODONE, polyethylene glycol    Vital Signs    Vitals:   06/20/20 1134 06/20/20 2043 06/21/20 0000 06/21/20 0442  BP: 111/84 104/67  96/60  Pulse: 96 99  85  Resp: 16 17 17 17   Temp: 98.1 F (36.7 C) 98.4 F (36.9 C)  97.7 F (36.5 C)  TempSrc: Oral Oral  Oral  SpO2: 96% 99%  100%  Weight:    76 kg  Height:        Intake/Output Summary (Last 24 hours) at 06/21/2020 0814 Last data filed at 06/21/2020 0500 Gross per 24 hour  Intake 2395.76 ml  Output --  Net 2395.76 ml   Last 3 Weights 06/21/2020 06/20/2020 06/19/2020  Weight (lbs) 167 lb 8 oz 164 lb 4.8 oz 156 lb 12.8 oz  Weight (kg) 75.978 kg 74.526 kg 71.124 kg  Some encounter information is confidential and restricted. Go to Review Flowsheets activity to see all data.      Telemetry    Sinus tachycardia - Personally Reviewed  ECG    No new tracing   Physical Exam   Patient resistant to physical exam: Will defer to MD  Labs    High Sensitivity Troponin:   Recent Labs  Lab 06/19/20 0740 06/19/20 0943 06/19/20 1636 06/19/20 2158  TROPONINIHS 2,414* 1,778* 2,148* 1,715*      Chemistry Recent Labs  Lab 06/19/20 0213 06/20/20 0258 06/21/20 0657  NA 138 139 139  K 3.7 4.6 4.1  CL 101 104 104  CO2 23 28 26   GLUCOSE 124* 77 100*  BUN  14 20 9   CREATININE 2.61* 1.52* 1.12  CALCIUM 9.8 8.8* 8.7*  PROT 8.2* 6.6  --   ALBUMIN 4.6 3.4*  --   AST 39 43*  --   ALT 35 34  --   ALKPHOS 79 67  --   BILITOT 0.5 0.8  --   GFRNONAA 30* 59* >60  GFRAA 35* >60 >60  ANIONGAP 14 7 9      Hematology Recent Labs  Lab 06/19/20 1636 06/20/20 0258 06/21/20 0657  WBC 12.9* 11.6* 7.9  RBC 4.66 4.58 4.20*  HGB 14.1 13.7 12.6*  HCT 43.4 43.0 40.6  MCV 93.1 93.9 96.7  MCH 30.3 29.9 30.0  MCHC 32.5 31.9 31.0  RDW 13.7 13.6 13.9  PLT 235 240 225    Radiology    CT Abdomen Pelvis Wo Contrast  Result Date: 06/19/2020 CLINICAL DATA:  Abdominal pain, flank pain, vomiting, acute renal failure EXAM: CT ABDOMEN AND PELVIS WITHOUT CONTRAST  TECHNIQUE: Multidetector CT imaging of the abdomen and pelvis was performed following the standard protocol without IV contrast. COMPARISON:  05/30/2015 FINDINGS: Lower chest: No acute abnormality. Hepatobiliary: No solid liver abnormality is seen. No gallstones, gallbladder wall thickening, or biliary dilatation. Pancreas: Unremarkable. No pancreatic ductal dilatation or surrounding inflammatory changes. Spleen: Normal in size without significant abnormality. Adrenals/Urinary Tract: Adrenal glands are unremarkable. Nonobstructive calculus of the inferior pole of the right kidney. No hydronephrosis. Bladder is unremarkable. Stomach/Bowel: Stomach is within normal limits. Appendix is surgically absent. No evidence of bowel wall thickening, distention, or inflammatory changes. Vascular/Lymphatic: No significant vascular findings are present. No enlarged abdominal or pelvic lymph nodes. Reproductive: No mass or other significant abnormality. Other: No abdominal wall hernia or abnormality. No abdominopelvic ascites. Musculoskeletal: No acute or significant osseous findings. IMPRESSION: 1. No acute non-contrast CT findings of the abdomen or pelvis to explain abdominal pain or vomiting. 2. Nonobstructive right nephrolithiasis. No hydronephrosis. Electronically Signed   By: Eddie Candle M.D.   On: 06/19/2020 09:14   US RENAL  Result Date: 06/19/2020 CLINICAL DATA:  AKI EXAM: RENAL / URINARY TRACT ULTRASOUND COMPLETE COMPARISON:  None. FINDINGS: Right Kidney: Renal measurements: 10.7 x 4.2 x 5.6 cm = volume: 131.3 mL. Echogenicity within normal limits. No mass or hydronephrosis visualized. Left Kidney: Renal measurements: 9.7 x 4.8 x 4.9 cm = volume: 119.3 mL. Echogenicity within normal limits. No mass or hydronephrosis visualized. Bladder: Appears normal for degree of bladder distention. Other: None. IMPRESSION: Normal renal ultrasound. Electronically Signed   By: Macy Mis M.D.   On: 06/19/2020 13:20    ECHOCARDIOGRAM COMPLETE  Result Date: 06/19/2020    ECHOCARDIOGRAM REPORT   Patient Name:   Brian [name] Date of Exam: 06/19/2020 Medical Rec #:  [id number]          Height:       69.0 in Accession #:    [id number]         Weight:       165.0 lb Date of Birth:  06/26/1986          BSA:          1.904 m Patient Age:    33 years           BP:           125/85 mmHg Patient Gender: M                  HR:           82 bpm.  Exam Location:  Inpatient Procedure: 2D Echo, Cardiac Doppler and Color Doppler Indications:    Chest Pain 786.50 / R07.9  History:        Patient has prior history of Echocardiogram examinations, most                 recent 04/06/2018.  Sonographer:    Bernadene Person RDCS Referring Phys: [id number] [facility] NARENDRA  Sonographer Comments: Technically difficult due to not being able to stay still. IMPRESSIONS  1. Hypokinesis of the basal to mid inferior, anteroseptal and inferoseptal myocardium. Left ventricular ejection fraction, by estimation, is 45 to 50%. The left ventricle has mildly decreased function. The left ventricle has no regional wall motion abnormalities. There is mild concentric left ventricular hypertrophy. Left ventricular diastolic parameters are consistent with Grade I diastolic dysfunction (impaired relaxation).  2. Right ventricular systolic function is normal. The right ventricular size is normal. There is normal pulmonary artery systolic pressure.  3. The mitral valve is normal in structure. Trivial mitral valve regurgitation. No evidence of mitral stenosis.  4. The aortic valve is tricuspid. Aortic valve regurgitation is not visualized. No aortic stenosis is present.  5. The inferior vena cava is normal in size with <50% respiratory variability, suggesting right atrial pressure of 8 mmHg. FINDINGS  Left Ventricle: Hypokinesis of the basal to mid inferior, anteroseptal and inferoseptal myocardium. Left ventricular ejection fraction, by estimation, is 45 to 50%. The left  ventricle has mildly decreased function. The left ventricle has no regional wall motion abnormalities. The left ventricular internal cavity size was normal in size. There is mild concentric left ventricular hypertrophy. Left ventricular diastolic parameters are consistent with Grade I diastolic dysfunction (impaired relaxation).  Normal left ventricular filling pressure. Right Ventricle: The right ventricular size is normal. No increase in right ventricular wall thickness. Right ventricular systolic function is normal. There is normal pulmonary artery systolic pressure. The tricuspid regurgitant velocity is 1.13 m/s, and  with an assumed right atrial pressure of 8 mmHg, the estimated right ventricular systolic pressure is 45.4 mmHg. Left Atrium: Left atrial size was normal in size. Right Atrium: Right atrial size was normal in size. Pericardium: There is no evidence of pericardial effusion. Mitral Valve: The mitral valve is normal in structure. Normal mobility of the mitral valve leaflets. Trivial mitral valve regurgitation. No evidence of mitral valve stenosis. Tricuspid Valve: The tricuspid valve is normal in structure. Tricuspid valve regurgitation is trivial. No evidence of tricuspid stenosis. Aortic Valve: The aortic valve is tricuspid. Aortic valve regurgitation is not visualized. No aortic stenosis is present. Pulmonic Valve: The pulmonic valve was normal in structure. Pulmonic valve regurgitation is not visualized. No evidence of pulmonic stenosis. Aorta: The aortic root is normal in size and structure. Venous: The inferior vena cava is normal in size with less than 50% respiratory variability, suggesting right atrial pressure of 8 mmHg. IAS/Shunts: No atrial level shunt detected by color flow Doppler.  LEFT VENTRICLE PLAX 2D LVIDd:         4.62 cm  Diastology LVIDs:         3.45 cm  LV e' lateral:   8.24 cm/s LV PW:         1.21 cm  LV E/e' lateral: 5.0 LV IVS:        1.07 cm  LV e' medial:    5.70 cm/s  LVOT diam:     2.10 cm  LV E/e' medial:  7.2 LV SV:  63 LV SV Index:   31 LVOT Area:     3.46 cm  RIGHT VENTRICLE RV S prime:     8.94 cm/s TAPSE (M-mode): 1.4 cm LEFT ATRIUM             Index       RIGHT ATRIUM           Index LA diam:        2.40 cm 1.26 cm/m  RA Area:     14.90 cm LA Vol (A2C):   37.2 ml 19.54 ml/m RA Volume:   37.00 ml  19.43 ml/m LA Vol (A4C):   30.6 ml 16.07 ml/m LA Biplane Vol: 35.1 ml 18.44 ml/m  AORTIC VALVE LVOT Vmean:  78.400 cm/s LVOT VTI:    0.171 m  AORTA Ao Root diam: 3.10 cm Ao Asc diam:  3.20 cm MITRAL VALVE               TRICUSPID VALVE MV Area (PHT): 3.85 cm    TR Peak grad:   5.1 mmHg MV Decel Time: 197 msec    TR Vmax:        113.00 cm/s MV E velocity: 41.10 cm/s MV A velocity: 57.90 cm/s  SHUNTS MV E/A ratio:  0.71        Systemic VTI:  0.17 m                            Systemic Diam: 2.10 cm Skeet Latch MD Electronically signed by Skeet Latch MD Signature Date/Time: 06/19/2020/4:28:34 PM    Final     Cardiac Studies   Echo 06/19/20 1. Hypokinesis of the basal to mid inferior, anteroseptal and  inferoseptal myocardium. Left ventricular ejection fraction, by  estimation, is 45 to 50%. The left ventricle has mildly decreased  function. The left ventricle has no regional wall motion  abnormalities. There is mild concentric left ventricular hypertrophy. Left  ventricular diastolic parameters are consistent with Grade I diastolic  dysfunction (impaired relaxation).  2. Right ventricular systolic function is normal. The right ventricular  size is normal. There is normal pulmonary artery systolic pressure.  3. The mitral valve is normal in structure. Trivial mitral valve  regurgitation. No evidence of mitral stenosis.  4. The aortic valve is tricuspid. Aortic valve regurgitation is not  visualized. No aortic stenosis is present.  5. The inferior vena cava is normal in size with <50% respiratory  variability, suggesting right atrial  pressure of 8 mmHg.   Patient Profile     33 y.o. male with a hx of polysubstance abuse, (methamphetamine, cocaine, heroin), prior IVDU, hepatitis C, cirrhosis, GERD, prior homelessness, malingering, bipolar disorder, current smoker, and alcohol abusewho is being seen today for the evaluation of elevated troponin.  Assessment & Plan    1. NSTEMI - Hs- troponin 2414>1778>2148>1715 - Echo showed LVEF of 45-50% and grade 1 DD - Treated with heparin - Renal function back to normal. Discussed cath however states that "I am unsure to get it done" - Continue ASA and statin   2. Acute on CKD IV - Scr peaked at 2.61. 1.12 today  3. Polysubstance abuse - poor insite   For questions or updates, please contact [place] Please consult [email or web address] for contact info under        SignedLeanor Kail, PA  06/21/2020, 8:14 AM

## 2020-06-21 NOTE — Discharge Instructions (Signed)
Brian [name], I am glad you are feeling better and ready for discharge. You were admitted because you had a type of heart attack that was caused from amphetamine use. Thankfully, it appears that the heart attack only caused some damage and your heart is pumping and squeezing okay. In order for you to prevent future cardiovascular events, it is important that you take the medications listed and refrain from tobacco use and other drugs, like amphetamines. Please see the following notes:  -Aspirin, 81mg  daily: This is an anti-platelet medication that helps prevent plaques from forming in your arteries. Take one pill per day. -Clopidogrel (PLAVIX), 75mg  daily: This is another anti-platelet medication that helps prevent plaques. Take one pill per day. -Atorvastatin (LIPITOR), 80mg  daily: This is a medication for your cholesterol. Take one pill per day. -Pantoprazole (PROTONIX) 40mg  daily: This medication is for your acid reflux. Take one pill per day.  -Please continue taking your other prescribed medications.  -In addition, it will be important to stop smoking cigarettes. Continuing to smoke will increase your risk of future heart attacks, stroke, cancer, and many other health problems.   -Our social workers have given you resources regarding drug use. In order to keep further events from happening, please utilize these resources. This can be a very difficult task, so it is important to surround yourself with resources that will help you.  -If you have any future chest pain, shortness of breath, leg swelling, or signs of stroke, please go to the emergency room.  -It was a pleasure meeting you, Brian [name]. I wish you the best and hope you stay happy and healthy!  Thanks, Sanjuan Dame, MD

## 2020-06-21 NOTE — Progress Notes (Signed)
Patient left floor for heart cath procedure.

## 2020-06-21 NOTE — Plan of Care (Signed)

## 2020-06-21 NOTE — Social Work (Signed)
8/26: Spoke with Pt and provided SA resources. Pt stated he didn't need SA resources, only behavioral health, advised that those were in the packet.

## 2020-06-21 NOTE — Discharge Summary (Signed)
Name: Brian [name] MRN: [id number] DOB: [birth year] 34 y.o. PCP: Beverly Sessions  Date of Admission: 06/19/2020  1:37 AM Date of Discharge:  06/21/2020 Attending Physician: Aldine Contes, MD  Discharge Diagnosis: 1. NSTEMI likely 2/2 amphetamine use 2. Acute kidney injury 3. Non-obstructing R nephrolithiasis  Discharge Medications: Allergies as of 06/21/2020       Reactions   Fish Allergy Anaphylaxis   Shellfish Allergy Anaphylaxis        Medication List     TAKE these medications    aspirin 81 MG chewable tablet Chew 1 tablet (81 mg total) by mouth daily. Start taking on: June 22, 2020   atorvastatin 80 MG tablet Commonly known as: LIPITOR Take 1 tablet (80 mg total) by mouth daily. Start taking on: June 22, 2020   buPROPion 150 MG 24 hr tablet Commonly known as: WELLBUTRIN XL Take 1 tablet (150 mg total) by mouth daily.   clopidogrel 75 MG tablet Commonly known as: PLAVIX Take 1 tablet (75 mg total) by mouth daily.   mirtazapine 7.5 MG tablet Commonly known as: REMERON Take 1 tablet (7.5 mg total) by mouth at bedtime.   pantoprazole 40 MG tablet Commonly known as: PROTONIX Take 1 tablet (40 mg total) by mouth daily. Start taking on: June 22, 2020   QUEtiapine 25 MG tablet Commonly known as: SEROQUEL Take 1 tablet (25 mg total) by mouth 3 (three) times daily.        Disposition and follow-up:   Mr.Brian [name] was discharged from [facility] in Stable condition.  At the hospital follow up visit please address:  1.  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  Labs / imaging needed at time of follow-up: CBC, CMP, lipid panel  3.  Pending labs/ test needing follow-up: n/a  Follow-up Appointments:     Follow-up Information    [facility]. Schedule an appointment as soon as possible for a visit in 2 week(s).   Contact information: [address]  [place] [address] [phone]        Belva Crome, MD .   Specialty: Cardiology Contact information: [phone] [place] [address] [phone]        [facility]. Go on 07/25/2020.   Why: @2 :30pm Contact information: [place] [address] [phone]              Hospital Course by problem list: 1. NSTEMI likely 2/2 amphetamine use: Patient presented with epigastric pain, reportedly use of amphetamine day prior. Trop 2400>1800>2100>1700. ECG w/o ischemic changes. TTE revealed WMA, EF 41-32%, grade 1 diastolic dysfunction. Started on heparin gtt, ASA, statin. Kidney function improved w/ IVF, cath then performed, revealing non-obstructive CAD. Patient to follow-up with PCP, encouraged cessation of all illicit drug use.   2. Acute kidney injury: Cr on arrival 2.61 from baseline of 1.0-1.2. Started on LR maintenance fluids, improved to 1.12 after two days. Given improvement, likely pre-renal.   3. Non-obstructing R nephrolithiasis: Patient complaining of R-sided abdominal pain. CT abdomen with non-obstructing kidney stone, no hydronephrosis on renal u/s. Will encourage po intake.  Discharge Vitals:   BP (!) 130/93 (BP Location: Left Arm)   Pulse 64   Temp 97.6 F (36.4 C) (Oral)   Resp 20   Ht 5\' 8"  (1.727 m)   Wt 76 kg   SpO2  100%   BMI 25.47 kg/m   Pertinent Labs, Studies, and Procedures:  CBC Latest Ref Rng & Units 06/21/2020 06/20/2020 06/19/2020  WBC 4.0 - 10.5 K/uL 7.9 11.6(H) 12.9(H)  Hemoglobin 13.0 - 17.0 g/dL 12.6(L) 13.7 14.1  Hematocrit 39 - 52 % 40.6 43.0 43.4  Platelets 150 - 400 K/uL 225 240 235   CMP Latest Ref Rng & Units 06/21/2020 06/20/2020 06/19/2020  Glucose 70 - 99 mg/dL 100(H) 77 124(H)  BUN 6 - 20 mg/dL 9 20 14   Creatinine 0.61 - 1.24 mg/dL 1.12 1.52(H) 2.61(H)  Sodium 135 - 145 mmol/L 139 139 138  Potassium 3.5 - 5.1  mmol/L 4.1 4.6 3.7  Chloride 98 - 111 mmol/L 104 104 101  CO2 22 - 32 mmol/L 26 28 23   Calcium 8.9 - 10.3 mg/dL 8.7(L) 8.8(L) 9.8  Total Protein 6.5 - 8.1 g/dL - 6.6 8.2(H)  Total Bilirubin 0.3 - 1.2 mg/dL - 0.8 0.5  Alkaline Phos 38 - 126 U/L - 67 79  AST 15 - 41 U/L - 43(H) 39  ALT 0 - 44 U/L - 34 35   A1c 5.3 (06/19/20)  CT abdomen/pelvis 06/19/20: IMPRESSION: 1. No acute non-contrast CT findings of the abdomen or pelvis to explain abdominal pain or vomiting. 2. Nonobstructive right nephrolithiasis. No hydronephrosis.  Heart cath 06/21/20: 1. No angiographically significant atherosclerotic coronary artery disease.  Moderate transient stenosis at the ostium of the RCA resolved with intracoronary nitroglycerin and is consistent with vasospasm. 2. Sluggish flow in the distal LAD, which may reflect an element of microvascular dysfunction. 3. Mildly elevated left ventricular filling pressure.  Discharge Instructions:     Discharge Instructions      Mr. Brian [name], I am glad you are feeling better and ready for discharge. You were admitted because you had a type of heart attack that was caused from amphetamine use. Thankfully, it appears that the heart attack only caused some damage and your heart is pumping and squeezing okay. In order for you to prevent future cardiovascular events, it is important that you take the medications listed and refrain from tobacco use and other drugs, like amphetamines. Please see the following notes:  -Aspirin, 81mg  daily: This is an anti-platelet medication that helps prevent plaques from forming in your arteries. Take one pill per day. -Clopidogrel (PLAVIX), 75mg  daily: This is another anti-platelet medication that helps prevent plaques. Take one pill per day. -Atorvastatin (LIPITOR), 80mg  daily: This is a medication for your cholesterol. Take one pill per day. -Pantoprazole (PROTONIX) 40mg  daily: This medication is for your acid reflux. Take one pill per  day.  -Please continue taking your other prescribed medications.  -In addition, it will be important to stop smoking cigarettes. Continuing to smoke will increase your risk of future heart attacks, stroke, cancer, and many other health problems.   -Our social workers have given you resources regarding drug use. In order to keep further events from happening, please utilize these resources. This can be a very difficult task, so it is important to surround yourself with resources that will help you.  -If you have any future chest pain, shortness of breath, leg swelling, or signs of stroke, please go to the emergency room.  -It was a pleasure meeting you, Mr. Brian [name]. I wish you the best and hope you stay happy and healthy!  Thanks, Sanjuan Dame, MD     Signed: Sanjuan Dame, MD 06/21/2020, 1:36 PM   Pager: [phone]

## 2020-06-24 LAB — CULTURE, BLOOD (ROUTINE X 2)
Culture: NO GROWTH
Culture: NO GROWTH

## 2020-07-01 ENCOUNTER — Other Ambulatory Visit: Payer: Self-pay

## 2020-07-01 ENCOUNTER — Emergency Department (HOSPITAL_COMMUNITY): Payer: Self-pay

## 2020-07-01 DIAGNOSIS — F1590 Other stimulant use, unspecified, uncomplicated: Secondary | ICD-10-CM | POA: Diagnosis present

## 2020-07-01 DIAGNOSIS — I309 Acute pericarditis, unspecified: Principal | ICD-10-CM | POA: Diagnosis present

## 2020-07-01 DIAGNOSIS — R45850 Homicidal ideations: Secondary | ICD-10-CM | POA: Diagnosis present

## 2020-07-01 DIAGNOSIS — Z7982 Long term (current) use of aspirin: Secondary | ICD-10-CM

## 2020-07-01 DIAGNOSIS — F17210 Nicotine dependence, cigarettes, uncomplicated: Secondary | ICD-10-CM | POA: Diagnosis present

## 2020-07-01 DIAGNOSIS — F1010 Alcohol abuse, uncomplicated: Secondary | ICD-10-CM | POA: Diagnosis present

## 2020-07-01 DIAGNOSIS — R45851 Suicidal ideations: Secondary | ICD-10-CM | POA: Diagnosis present

## 2020-07-01 DIAGNOSIS — F329 Major depressive disorder, single episode, unspecified: Secondary | ICD-10-CM | POA: Diagnosis present

## 2020-07-01 DIAGNOSIS — R7989 Other specified abnormal findings of blood chemistry: Secondary | ICD-10-CM | POA: Diagnosis present

## 2020-07-01 DIAGNOSIS — R001 Bradycardia, unspecified: Secondary | ICD-10-CM | POA: Diagnosis not present

## 2020-07-01 DIAGNOSIS — F419 Anxiety disorder, unspecified: Secondary | ICD-10-CM | POA: Diagnosis present

## 2020-07-01 DIAGNOSIS — I2510 Atherosclerotic heart disease of native coronary artery without angina pectoris: Secondary | ICD-10-CM | POA: Diagnosis present

## 2020-07-01 DIAGNOSIS — Z87442 Personal history of urinary calculi: Secondary | ICD-10-CM

## 2020-07-01 DIAGNOSIS — Z91013 Allergy to seafood: Secondary | ICD-10-CM

## 2020-07-01 DIAGNOSIS — K219 Gastro-esophageal reflux disease without esophagitis: Secondary | ICD-10-CM | POA: Diagnosis present

## 2020-07-01 DIAGNOSIS — N179 Acute kidney failure, unspecified: Secondary | ICD-10-CM | POA: Diagnosis present

## 2020-07-01 DIAGNOSIS — I5032 Chronic diastolic (congestive) heart failure: Secondary | ICD-10-CM | POA: Diagnosis present

## 2020-07-01 DIAGNOSIS — Z79899 Other long term (current) drug therapy: Secondary | ICD-10-CM

## 2020-07-01 DIAGNOSIS — Z8249 Family history of ischemic heart disease and other diseases of the circulatory system: Secondary | ICD-10-CM

## 2020-07-01 DIAGNOSIS — R778 Other specified abnormalities of plasma proteins: Secondary | ICD-10-CM | POA: Diagnosis present

## 2020-07-01 DIAGNOSIS — Z590 Homelessness: Secondary | ICD-10-CM

## 2020-07-01 DIAGNOSIS — F1410 Cocaine abuse, uncomplicated: Secondary | ICD-10-CM | POA: Diagnosis present

## 2020-07-01 DIAGNOSIS — Z7902 Long term (current) use of antithrombotics/antiplatelets: Secondary | ICD-10-CM

## 2020-07-01 DIAGNOSIS — I252 Old myocardial infarction: Secondary | ICD-10-CM

## 2020-07-01 DIAGNOSIS — Z20822 Contact with and (suspected) exposure to covid-19: Secondary | ICD-10-CM | POA: Diagnosis present

## 2020-07-01 DIAGNOSIS — Z8711 Personal history of peptic ulcer disease: Secondary | ICD-10-CM

## 2020-07-01 LAB — TROPONIN I (HIGH SENSITIVITY): Troponin I (High Sensitivity): 67 ng/L — ABNORMAL HIGH (ref <18)

## 2020-07-01 LAB — URINALYSIS, ROUTINE W REFLEX MICROSCOPIC
Bilirubin Urine: NEGATIVE
Glucose, UA: NEGATIVE mg/dL
Hgb urine dipstick: NEGATIVE
Ketones, ur: 5 mg/dL — AB
Leukocytes,Ua: NEGATIVE
Nitrite: NEGATIVE
Protein, ur: >=300 mg/dL — AB
Specific Gravity, Urine: 1.028 (ref 1.005–1.030)
pH: 5 (ref 5.0–8.0)

## 2020-07-01 LAB — CBC
HCT: 48 % (ref 39.0–52.0)
Hemoglobin: 16.3 g/dL (ref 13.0–17.0)
MCH: 31 pg (ref 26.0–34.0)
MCHC: 34 g/dL (ref 30.0–36.0)
MCV: 91.4 fL (ref 80.0–100.0)
Platelets: 294 10*3/uL (ref 150–400)
RBC: 5.25 MIL/uL (ref 4.22–5.81)
RDW: 13.2 % (ref 11.5–15.5)
WBC: 13.2 10*3/uL — ABNORMAL HIGH (ref 4.0–10.5)
nRBC: 0 % (ref 0.0–0.2)

## 2020-07-01 LAB — BASIC METABOLIC PANEL
Anion gap: 18 — ABNORMAL HIGH (ref 5–15)
BUN: 42 mg/dL — ABNORMAL HIGH (ref 6–20)
CO2: 17 mmol/L — ABNORMAL LOW (ref 22–32)
Calcium: 9.9 mg/dL (ref 8.9–10.3)
Chloride: 101 mmol/L (ref 98–111)
Creatinine, Ser: 1.86 mg/dL — ABNORMAL HIGH (ref 0.61–1.24)
GFR calc Af Amer: 53 mL/min — ABNORMAL LOW (ref >60)
GFR calc non Af Amer: 46 mL/min — ABNORMAL LOW (ref >60)
Glucose, Bld: 77 mg/dL (ref 70–99)
Potassium: 4 mmol/L (ref 3.5–5.1)
Sodium: 136 mmol/L (ref 135–145)

## 2020-07-01 NOTE — ED Triage Notes (Signed)
Patient reports to the ER for chest pain post cath. Patient reports he took adderall x2 days ago and he knows he shouldn't have. Hx of polysubstance abuse. Patient reports he is having chest pain and SOB

## 2020-07-01 NOTE — ED Notes (Signed)
IV drug user.  Was unable to obtain vitals.  RN aware

## 2020-07-02 ENCOUNTER — Inpatient Hospital Stay (HOSPITAL_COMMUNITY)
Admission: EM | Admit: 2020-07-02 | Discharge: 2020-07-04 | DRG: 315 | Disposition: A | Discharge status: Home / Self Care | Payer: Self-pay | Attending: Internal Medicine | Admitting: Internal Medicine

## 2020-07-02 ENCOUNTER — Observation Stay (HOSPITAL_COMMUNITY): Payer: Self-pay

## 2020-07-02 DIAGNOSIS — I308 Other forms of acute pericarditis: Secondary | ICD-10-CM

## 2020-07-02 DIAGNOSIS — I201 Angina pectoris with documented spasm: Secondary | ICD-10-CM

## 2020-07-02 DIAGNOSIS — F32A Depression, unspecified: Secondary | ICD-10-CM

## 2020-07-02 DIAGNOSIS — R0789 Other chest pain: Secondary | ICD-10-CM

## 2020-07-02 DIAGNOSIS — N179 Acute kidney failure, unspecified: Secondary | ICD-10-CM | POA: Diagnosis present

## 2020-07-02 DIAGNOSIS — R079 Chest pain, unspecified: Secondary | ICD-10-CM | POA: Diagnosis present

## 2020-07-02 DIAGNOSIS — F1910 Other psychoactive substance abuse, uncomplicated: Secondary | ICD-10-CM | POA: Diagnosis present

## 2020-07-02 DIAGNOSIS — I32 Pericarditis in diseases classified elsewhere: Secondary | ICD-10-CM

## 2020-07-02 DIAGNOSIS — I319 Disease of pericardium, unspecified: Secondary | ICD-10-CM

## 2020-07-02 DIAGNOSIS — F329 Major depressive disorder, single episode, unspecified: Secondary | ICD-10-CM

## 2020-07-02 LAB — CBC
HCT: 40.3 % (ref 39.0–52.0)
Hemoglobin: 13.4 g/dL (ref 13.0–17.0)
MCH: 30.5 pg (ref 26.0–34.0)
MCHC: 33.3 g/dL (ref 30.0–36.0)
MCV: 91.8 fL (ref 80.0–100.0)
Platelets: 238 10*3/uL (ref 150–400)
RBC: 4.39 MIL/uL (ref 4.22–5.81)
RDW: 13.2 % (ref 11.5–15.5)
WBC: 14.1 10*3/uL — ABNORMAL HIGH (ref 4.0–10.5)
nRBC: 0 % (ref 0.0–0.2)

## 2020-07-02 LAB — HEPATIC FUNCTION PANEL
ALT: 56 U/L — ABNORMAL HIGH (ref 0–44)
AST: 59 U/L — ABNORMAL HIGH (ref 15–41)
Albumin: 4.8 g/dL (ref 3.5–5.0)
Alkaline Phosphatase: 77 U/L (ref 38–126)
Bilirubin, Direct: 0.2 mg/dL (ref 0.0–0.2)
Indirect Bilirubin: 1.1 mg/dL — ABNORMAL HIGH (ref 0.3–0.9)
Total Bilirubin: 1.3 mg/dL — ABNORMAL HIGH (ref 0.3–1.2)
Total Protein: 8.5 g/dL — ABNORMAL HIGH (ref 6.5–8.1)

## 2020-07-02 LAB — CK: Total CK: 692 U/L — ABNORMAL HIGH (ref 49–397)

## 2020-07-02 LAB — CREATININE, SERUM
Creatinine, Ser: 1.22 mg/dL (ref 0.61–1.24)
GFR calc Af Amer: >60 mL/min (ref >60)
GFR calc non Af Amer: >60 mL/min (ref >60)

## 2020-07-02 LAB — SEDIMENTATION RATE: Sed Rate: 2 mm/hr (ref 0–16)

## 2020-07-02 LAB — TROPONIN I (HIGH SENSITIVITY)
Troponin I (High Sensitivity): 68 ng/L — ABNORMAL HIGH (ref <18)
Troponin I (High Sensitivity): 78 ng/L — ABNORMAL HIGH (ref <18)

## 2020-07-02 LAB — ECHOCARDIOGRAM LIMITED: S' Lateral: 2.55 cm

## 2020-07-02 LAB — SARS CORONAVIRUS 2 BY RT PCR (HOSPITAL ORDER, PERFORMED IN ~~LOC~~ HOSPITAL LAB): SARS Coronavirus 2: NEGATIVE

## 2020-07-02 MED ORDER — COLCHICINE 0.6 MG PO TABS
0.6000 mg | ORAL_TABLET | Freq: Two times a day (BID) | ORAL | Status: DC
  Administered 2020-07-02 – 2020-07-04 (×5): 0.6 mg via ORAL
  Filled 2020-07-02 (×5): qty 1

## 2020-07-02 MED ORDER — QUETIAPINE FUMARATE 25 MG PO TABS
25.0000 mg | ORAL_TABLET | Freq: Three times a day (TID) | ORAL | Status: DC
  Administered 2020-07-02 – 2020-07-04 (×7): 25 mg via ORAL
  Filled 2020-07-02 (×7): qty 1

## 2020-07-02 MED ORDER — MIRTAZAPINE 15 MG PO TABS
7.5000 mg | ORAL_TABLET | Freq: Every day | ORAL | Status: DC
  Administered 2020-07-02 – 2020-07-03 (×2): 7.5 mg via ORAL
  Filled 2020-07-02 (×2): qty 1

## 2020-07-02 MED ORDER — ENOXAPARIN SODIUM 40 MG/0.4ML ~~LOC~~ SOLN
40.0000 mg | SUBCUTANEOUS | Status: DC
  Administered 2020-07-02: 40 mg via SUBCUTANEOUS
  Filled 2020-07-02 (×3): qty 0.4

## 2020-07-02 MED ORDER — ASPIRIN 81 MG PO CHEW
324.0000 mg | CHEWABLE_TABLET | Freq: Once | ORAL | Status: AC
  Administered 2020-07-02: 324 mg via ORAL
  Filled 2020-07-02: qty 4

## 2020-07-02 MED ORDER — ATORVASTATIN CALCIUM 40 MG PO TABS
80.0000 mg | ORAL_TABLET | Freq: Every day | ORAL | Status: DC
  Administered 2020-07-02 – 2020-07-04 (×3): 80 mg via ORAL
  Filled 2020-07-02 (×3): qty 2

## 2020-07-02 MED ORDER — BUPROPION HCL ER (XL) 150 MG PO TB24
150.0000 mg | ORAL_TABLET | Freq: Every day | ORAL | Status: DC
  Administered 2020-07-02 – 2020-07-04 (×3): 150 mg via ORAL
  Filled 2020-07-02 (×3): qty 1

## 2020-07-02 MED ORDER — SODIUM CHLORIDE 0.9 % IV BOLUS
1000.0000 mL | Freq: Once | INTRAVENOUS | Status: AC
  Administered 2020-07-02: 1000 mL via INTRAVENOUS

## 2020-07-02 MED ORDER — PREDNISONE 20 MG PO TABS
20.0000 mg | ORAL_TABLET | Freq: Every day | ORAL | Status: DC
  Administered 2020-07-02: 20 mg via ORAL
  Filled 2020-07-02: qty 1

## 2020-07-02 MED ORDER — FENTANYL CITRATE (PF) 100 MCG/2ML IJ SOLN
50.0000 ug | Freq: Once | INTRAMUSCULAR | Status: AC
  Administered 2020-07-02: 50 ug via INTRAVENOUS
  Filled 2020-07-02: qty 2

## 2020-07-02 MED ORDER — ONDANSETRON HCL 4 MG/2ML IJ SOLN: 4.0000 mg | Freq: Four times a day (QID) | INTRAMUSCULAR | Status: DC | PRN

## 2020-07-02 MED ORDER — CLOPIDOGREL BISULFATE 75 MG PO TABS
75.0000 mg | ORAL_TABLET | Freq: Every day | ORAL | Status: DC
  Administered 2020-07-02 – 2020-07-04 (×3): 75 mg via ORAL
  Filled 2020-07-02 (×3): qty 1

## 2020-07-02 MED ORDER — PANTOPRAZOLE SODIUM 40 MG PO TBEC
40.0000 mg | DELAYED_RELEASE_TABLET | Freq: Every day | ORAL | Status: DC
  Administered 2020-07-02 – 2020-07-04 (×3): 40 mg via ORAL
  Filled 2020-07-02 (×3): qty 1

## 2020-07-02 MED ORDER — ASPIRIN 81 MG PO CHEW
81.0000 mg | CHEWABLE_TABLET | Freq: Every day | ORAL | Status: DC
  Administered 2020-07-02 – 2020-07-04 (×3): 81 mg via ORAL
  Filled 2020-07-02 (×3): qty 1

## 2020-07-02 MED ORDER — ACETAMINOPHEN 325 MG PO TABS: 650.0000 mg | ORAL_TABLET | ORAL | Status: DC | PRN

## 2020-07-02 NOTE — ED Notes (Signed)
Right arm  117/84 map 96 Left arm      105/70   Map 82

## 2020-07-02 NOTE — ED Notes (Signed)
PA made aware of BP 

## 2020-07-02 NOTE — BH Assessment (Signed)
Secure chat with pt's nurse, Marcy Panning, RN, confirms pt will be medically admitted. TTS consult cancelled & can be reordered when pt is medically cleared.

## 2020-07-02 NOTE — Consult Note (Signed)
Cardiology Consultation:   Patient ID: Brian [name] MRN: [id number]; DOB: 1986-01-18  Admit date: 07/02/2020 Date of Consult: 07/02/2020  Primary Care Provider: Beverly Sessions Primary Cardiologist: Sinclair Grooms, MD  Primary Electrophysiologist:  None    Patient Profile:   Brian [name] is a 34 y.o. male with a hx of cocaine abuse, polysubstance abuse who is being seen today for the evaluation of chest pain at the request of Marva Panda, MD.  History of Present Illness:   Brian [name] 34yo male with a hx of polysubstance abuse with cocaine, meth, ETOH, tobacco and amphetamines who presented to [facility] ER with epigastric pain after using amphetamines the day before.  He ruled in for NSTEMI with hsTrop 2400>1800>2100>1700 and EKG without ischemic changes.  Echo showed mildly reduced LVF with EF 45-50% and G1DD.  He also had AKI that improved with IVF.  He underwent cardiac cath that showed no angiographically significant CAD.  There was moderate transient stenosis at the ostium of the RCA which resolved with IC nitro and c/w vasospasm.  There was sluggish flow in the dLAD felt to possibly reflect microvascular dysfunction.  His LVEDP was mildly elevated.  He was encouraged to abstain from drugs and ETOH and was started on DAPT with ASA and plavix as well as low dose Cardizem.    Yesterday  he presented to [facility] ER with after doing amphetamines last night and developed anterior chest pain associated with nausea and diaphoresis.  In ER Bp initially 130/5mHg and dropped to 85/431mg and then increased to 104/5655m.  O2 sats 100% on RA.  SCr increased to 1.6 and LFTs mildly elevated.  CPK 692 and hsTrop 68>67.  He is COVID 19 neg.  Cxray was normal.  Cards was consulted over the phone and given EKG changes and sx it was felt that he had acute pericarditis with recommendations to treat with antiinflammatories.  He was given IVF and Fentanyl and ASA.  Apparently he was very verbally aggressive with the  staff and say he was suicidal and homicidal and threatened to leave.  There was apparently a white substance noted on the floor of the triage room as well.  [facility] Prednis[facility] instead of NSAIDs due to AKI.  Cardiology is now asked to consult formally.    Past Medical History:  Diagnosis Date  . Alcohol abuse   . Anxiety   . [facility] [facility] [place] . Depression   . Kidney stones   . [place] . Peptic ulcer   . [facility] . Renal disorder    kidney stones    Past Surgical History:  Procedure Laterality Date  . ESOPHAGOGASTRODUODENOSCOPY N/A 05/29/2014   Procedure: ESOPHAGOGASTRODUODENOSCOPY (EGD) ;  Surgeon: PatBeryle BeamsD;  [facility];  Service: Endoscopy;  Laterality: N/A;  check with Dr. ManCollene Maresout sedation type/timinng - I recommend MAC  . HAND SURGERY  2006  . LEFT HEART CATH AND CORONARY ANGIOGRAPHY N/A 06/21/2020   Procedure: LEFT HEART CATH AND CORONARY ANGIOGRAPHY;  Surgeon: EndNelva BushD;  Location: [facility];  Service: Cardiovascular;  Laterality: N/A;     Home Medications:  Prior to Admission medications   Medication Sig Start Date End Date Taking? Authorizing Provider  aspirin 81 MG chewable tablet Chew 1 tablet (81 mg total) by mouth daily. 06/22/20 07/22/20 Yes BraSanjuan DameD  atorvastatin (LIPITOR) 80 MG tablet Take  1 tablet (80 mg total) by mouth daily. 06/22/20 07/22/20 Yes Sanjuan Dame, MD  buPROPion (WELLBUTRIN XL) 150 MG 24 hr tablet Take 1 tablet (150 mg total) by mouth daily. 06/05/20  Yes Rankin, Shuvon B, NP  clopidogrel (PLAVIX) 75 MG tablet Take 1 tablet (75 mg total) by mouth daily. 06/21/20 07/21/20 Yes Sanjuan Dame, MD  mirtazapine (REMERON) 7.5 MG tablet Take 1 tablet (7.5 mg total) by mouth at bedtime. 06/04/20  Yes Rankin, Shuvon B, NP  pantoprazole (PROTONIX) 40 MG tablet Take 1 tablet (40 mg total) by mouth daily. 06/22/20 07/22/20 Yes Sanjuan Dame, MD  QUEtiapine (SEROQUEL) 25 MG tablet Take 1 tablet (25 mg total) by mouth 3 (three) times daily. 06/04/20  Yes Rankin, Shuvon B, NP  famotidine (PEPCID) 20 MG tablet Take 1 tablet (20 mg total) by mouth 2 (two) times daily. Patient not taking: Reported on 11/04/2019 08/30/19 05/04/20  Joy, Helane Gunther, PA-C  FLUoxetine (PROZAC) 20 MG capsule Take 1 capsule (20 mg total) by mouth daily. For depression Patient not taking: Reported on 11/04/2019 04/01/18 05/04/20  Lindell Spar I, NP  gabapentin (NEURONTIN) 300 MG capsule Take 2 capsules (600 mg total) by mouth 3 (three) times daily. For agitation Patient not taking: Reported on 05/02/2020 03/31/18 05/04/20  Lindell Spar I, NP  sucralfate (CARAFATE) 1 g tablet Take 1 tablet (1 g total) by mouth 4 (four) times daily -  with meals and at bedtime. Patient not taking: Reported on 11/04/2019 08/30/19 05/04/20  Joy, Helane Gunther, PA-C  traZODone (DESYREL) 50 MG tablet Take 1 tablet (50 mg total) by mouth at bedtime as needed for sleep. Patient not taking: Reported on 08/30/2019 03/31/18 08/30/19  Lindell Spar I, NP    Inpatient Medications: Scheduled Meds: . aspirin  81 mg Oral Daily  . atorvastatin  80 mg Oral Daily  . buPROPion  150 mg Oral Daily  . clopidogrel  75 mg Oral Daily  . colchicine  0.6 mg Oral BID  . enoxaparin (LOVENOX) injection  40 mg Subcutaneous Q24H  . mirtazapine  7.5 mg Oral QHS  . pantoprazole  40 mg Oral Daily  . QUEtiapine  25 mg Oral TID   Continuous Infusions:  PRN Meds: acetaminophen, ondansetron (ZOFRAN) IV  Allergies:    Allergies  Allergen Reactions  . Fish Allergy Anaphylaxis  . Shellfish Allergy Anaphylaxis    Social History:   Social History   Socioeconomic History  . Marital status: Single    Spouse name: Not on file  . Number of children: Not on file  . Years of education: Not on file  . Highest education level: Not on file  Occupational History  . Not on file  Tobacco Use  . Smoking status: Current Every Day Smoker      Packs/day: 0.50    Types: Cigarettes  . Smokeless tobacco: Never Used  Vaping Use  . Vaping Use: Never used  Substance and Sexual Activity  . Alcohol use: Not Currently  . Drug use: Not Currently    Types: Marijuana, Cocaine, IV    Comment: heroin and meth  . Sexual activity: Not on file  Other Topics Concern  . Not on file  Social History Narrative   Lives with a friend   Lost house and custody of son 9broke up with fiancee)   Nature conservation officer   Social Determinants of Health   Financial Resource Strain:   . Difficulty of Paying Living Expenses: Not on file  Food Insecurity:   .  Worried About Charity fundraiser in the Last Year: Not on file  . Ran Out of Food in the Last Year: Not on file  Transportation Needs:   . Lack of Transportation (Medical): Not on file  . Lack of Transportation (Non-Medical): Not on file  Physical Activity:   . Days of Exercise per Week: Not on file  . Minutes of Exercise per Session: Not on file  Stress:   . Feeling of Stress : Not on file  Social Connections:   . Frequency of Communication with Friends and Family: Not on file  . Frequency of Social Gatherings with Friends and Family: Not on file  . Attends Religious Services: Not on file  . Active Member of Clubs or Organizations: Not on file  . Attends Archivist Meetings: Not on file  . Marital Status: Not on file  Intimate Partner Violence:   . Fear of Current or Ex-Partner: Not on file  . Emotionally Abused: Not on file  . Physically Abused: Not on file  . Sexually Abused: Not on file    Family History:    Family History  Problem Relation Age of Onset  . Heart disease Father      ROS:  Please see the history of present illness.   All other ROS reviewed and negative.     Physical Exam/Data:   Vitals:   07/02/20 1212 07/02/20 1330 07/02/20 1400 07/02/20 1658  BP: 106/67 (!) 109/58 108/64 108/66  Pulse: 61 72 64 (!) 59  Resp: _0 Temp: 97.6 F (36.4  C)     TempSrc:      SpO2: 98% 97% 99% 97%    Intake/Output Summary (Last 24 hours) at 07/02/2020 1725 Last data filed at 07/02/2020 0955 Gross per 24 hour  Intake 999 ml  Output --  Net 999 ml   Last 3 Weights 06/21/2020 06/20/2020 06/19/2020  Weight (lbs) 167 lb 8 oz 164 lb 4.8 oz 156 lb 12.8 oz  Weight (kg) 75.978 kg 74.526 kg 71.124 kg  Some encounter information is confidential and restricted. Go to Review Flowsheets activity to see all data.     There is no height or weight on file to calculate BMI.  General:  Well nourished, well developed, in no acute distress HEENT: normal Lymph: no adenopathy Neck: no JVD Endocrine:  No thryomegaly Vascular: No carotid bruits; FA pulses 2+ bilaterally without bruits  Cardiac:  normal S1, S2; RRR; no murmur  Lungs:  clear to auscultation bilaterally, no wheezing, rhonchi or rales  Abd: soft, nontender, no hepatomegaly  Ext: no edema Musculoskeletal:  No deformities, BUE and BLE strength normal and equal Skin: warm and dry  Neuro:  CNs 2-12 intact, no focal abnormalities noted Psych:  Normal affect   EKG:  The EKG was personally reviewed and demonstrates:  NSR with diffuse PR depression and ST elevation c/w acute pericarditis Telemetry:  Telemetry was personally reviewed and demonstrates:  NSR  Relevant CV Studies: 2D echo 07/02/2020 IMPRESSIONS    1. Limited echo no color/doppler EF is normal previously described RWMAls  not apparent Apex not well seen.  2. Left ventricular ejection fraction, by estimation, is 55 to 60%. The  left ventricle has normal function. The left ventricle has no regional  wall motion abnormalities.  3. Right ventricular systolic function is normal. The right ventricular  size is normal.  4. The mitral valve is normal in structure. No evidence of mitral valve  regurgitation.  No evidence of mitral stenosis.  5. The aortic valve is normal in structure. Aortic valve regurgitation is  not visualized. No  aortic stenosis is present.  6. The inferior vena cava is normal in size with greater than 50%  respiratory variability, suggesting right atrial pressure of 3 mmHg.   Laboratory Data:  High Sensitivity Troponin:   Recent Labs  Lab 06/19/20 1636 06/19/20 2158 07/01/20 2210 07/02/20 0348 07/02/20 1050  TROPONINIHS 2,148* 1,715* 67* 68* 78*     Chemistry Recent Labs  Lab 07/01/20 2210 07/02/20 1050  NA 136  --   K 4.0  --   CL 101  --   CO2 17*  --   GLUCOSE 77  --   BUN 42*  --   CREATININE 1.86* 1.22  CALCIUM 9.9  --   GFRNONAA 46* >60  GFRAA 53* >60  ANIONGAP 18*  --     Recent Labs  Lab 07/02/20 0348  PROT 8.5*  ALBUMIN 4.8  AST 59*  ALT 56*  ALKPHOS 77  BILITOT 1.3*   Hematology Recent Labs  Lab 07/01/20 2210 07/02/20 1050  WBC 13.2* 14.1*  RBC 5.25 4.39  HGB 16.3 13.4  HCT 48.0 40.3  MCV 91.4 91.8  MCH 31.0 30.5  MCHC 34.0 33.3  RDW 13.2 13.2  PLT 294 238   BNPNo results for input(s): BNP, PROBNP in the last 168 hours.  DDimer No results for input(s): DDIMER in the last 168 hours.   Radiology/Studies:  DG Chest 2 View  Result Date: 07/01/2020 CLINICAL DATA:  Chest pain. EXAM: CHEST - 2 VIEW COMPARISON:  June 19, 2020. FINDINGS: The heart size and mediastinal contours are within normal limits. Both lungs are clear. No pneumothorax or pleural effusion is noted. The visualized skeletal structures are unremarkable. IMPRESSION: No active cardiopulmonary disease. Electronically Signed   By: Marijo Conception M.D.   On: 07/01/2020 14:17   ECHOCARDIOGRAM LIMITED  Result Date: 07/02/2020    ECHOCARDIOGRAM LIMITED REPORT   Patient Name:   Rainn A Kindley Date of Exam: 07/02/2020 Medical Rec #:  [id number]          Height:       68.0 in Accession #:    [id number]         Weight:       167.5 lb Date of Birth:  1986-07-27          BSA:          1.895 m Patient Age:    33 years           BP:           100/64 mmHg Patient Gender: M                  HR:            58 bpm. Exam Location:  Inpatient Procedure: Limited Echo and Cardiac Doppler Indications:    Chest Pain 786.50 / R07.9  History:        Patient has prior history of Echocardiogram examinations, most                 recent 06/19/2020. Risk Factors:Current Smoker.  Sonographer:    Vickie Epley RDCS Referring Phys: [id number] [place]  1. Limited echo no color/doppler EF is normal previously described RWMAls not apparent Apex not well seen.  2. Left ventricular ejection fraction, by estimation, is 55 to 60%. The left ventricle has normal function.  The left ventricle has no regional wall motion abnormalities.  3. Right ventricular systolic function is normal. The right ventricular size is normal.  4. The mitral valve is normal in structure. No evidence of mitral valve regurgitation. No evidence of mitral stenosis.  5. The aortic valve is normal in structure. Aortic valve regurgitation is not visualized. No aortic stenosis is present.  6. The inferior vena cava is normal in size with greater than 50% respiratory variability, suggesting right atrial pressure of 3 mmHg. FINDINGS  Left Ventricle: Left ventricular ejection fraction, by estimation, is 55 to 60%. The left ventricle has normal function. The left ventricle has no regional wall motion abnormalities. The left ventricular internal cavity size was normal in size. There is  no left ventricular hypertrophy. Right Ventricle: The right ventricular size is normal. No increase in right ventricular wall thickness. Right ventricular systolic function is normal. Left Atrium: Left atrial size was normal in size. Right Atrium: Right atrial size was normal in size. Pericardium: There is no evidence of pericardial effusion. Mitral Valve: The mitral valve is normal in structure. Normal mobility of the mitral valve leaflets. No evidence of mitral valve stenosis. Tricuspid Valve: The tricuspid valve is normal in structure. Tricuspid valve regurgitation is not  demonstrated. No evidence of tricuspid stenosis. Aortic Valve: The aortic valve is normal in structure. Aortic valve regurgitation is not visualized. No aortic stenosis is present. Pulmonic Valve: The pulmonic valve was normal in structure. Pulmonic valve regurgitation is not visualized. No evidence of pulmonic stenosis. Aorta: The aortic root is normal in size and structure. Venous: The inferior vena cava is normal in size with greater than 50% respiratory variability, suggesting right atrial pressure of 3 mmHg. IAS/Shunts: No atrial level shunt detected by color flow Doppler. Additional Comments: Limited echo no color/doppler EF is normal previously described RWMAls not apparent Apex not well seen. LEFT VENTRICLE PLAX 2D LVIDd:         4.10 cm LVIDs:         2.55 cm LV PW:         1.00 cm LV IVS:        0.80 cm  LEFT ATRIUM         Index LA diam:    3.00 cm 1.58 cm/m Jenkins Rouge MD Electronically signed by Jenkins Rouge MD Signature Date/Time: 07/02/2020/2:54:12 PM    Final       Assessment and Plan:   1. Chest pain -this is in the setting of doing amphetamines again last night -hsTrop elevated but trending downward and likely residual from a week ago when he presented with NSTEMI in setting of acute RCA vasospasm from drugs -now with chest pain and EKG c/w acute pericarditis -his Scr is elevated and he was given a dose of Prednisone by the [facility] due to elevated SCr and concern for NSAID use -will change to Colchicine 0.64m BID -continue ASA -if renal function improves then plan of cardiac MRI in am -check sed rate and hsCRP -2D echo this admit shows normal LVF and actually improved EF from last admission with no RWMAs -no evidence of pericardial effusion on echo  2.  Recent NSTEMI -cath at that time showed no angiographically significant CAD -he had ostial RCA vasospasm that resolved with IC NTG -sluggish flow was also noted in dLAD possible from microvascular dysfunction -now on ASA, plavix  and statin -Cardizem was recommended at last d/c but BP was soft  3. Polysubstance abuse -he has done cocaine,  amphetamines, ETOH and tobacco and was encouraged to quit -likely will need rehab      For questions or updates, please contact [place] Please consult [email or web address] for contact info under     Signed, Fransico Him, MD  07/02/2020 5:25 PM

## 2020-07-02 NOTE — ED Provider Notes (Signed)
[facility] DEPT Provider Note   CSN: [id number] Arrival date & time: 07/01/20  1317     History Chief Complaint  Patient presents with  . Chest Pain    Brian [name] is a 34 y.o. male.  HPI   34 year old male with a history of EtOH abuse, anxiety, cocaine abuse, depression, kidney stones, peptic ulcers, who presents to the emergency department today for evaluation of chest pain.  She describes right lower rib for NSTEMI that he was told was related to his meth use.  States he relapsed and used meth again last night and had recurrence of his pain.  Pain located to the anterior chest.  Associated with shortness of breath and nausea.  He also has intermittent diaphoresis and lightheadedness as well.  He also reportedly told the triage nurse that he took Adderall as well.  Patient further complaining of pain to his bilateral mid back and lower legs.  Past Medical History:  Diagnosis Date  . Alcohol abuse   . Anxiety   . Cocaine abuse ([place])   . Depression   . Kidney stones   . Narcotic abuse ([place])   . Peptic ulcer   . Polysubstance abuse ([place])   . Renal disorder    kidney stones    Patient Active Problem List   Diagnosis Date Noted  . Elevated troponin   . Non-ST elevation (NSTEMI) myocardial infarction ([place])   . Chest pain 06/19/2020  . Psychoactive substance-induced psychosis ([place]) 06/04/2020  . Malingering 06/04/2020  . Paranoia ([place])   . Amphetamine use disorder, severe, dependence ([place]) 03/26/2018  . Substance induced mood disorder ([place]) 03/26/2018  . Bipolar II disorder, severe, depressed, with anxious distress ([place]) 03/25/2018  . Cellulitis 05/20/2015  . Dysuria 05/20/2015  . Smoker 05/20/2015  . Homeless single person 05/20/2015  . Drug abuse ([place])   . Opioid use disorder, severe, dependence ([place]) 03/08/2015  . Ileus of unspecified type ([place]) 05/28/2014  . Abdominal pain 05/28/2014  . AKI (acute kidney injury) ([place]) 05/28/2014   . Esophagitis, acute 05/28/2014  . Polysubstance abuse ([place]) 05/28/2014  . Hematemesis 05/28/2014    Past Surgical History:  Procedure Laterality Date  . ESOPHAGOGASTRODUODENOSCOPY N/A 05/29/2014   Procedure: ESOPHAGOGASTRODUODENOSCOPY (EGD) ;  Surgeon: Beryle Beams, MD;  Location: [facility] ENDOSCOPY;  Service: Endoscopy;  Laterality: N/A;  check with Dr. Collene Mares about sedation type/timinng - I recommend MAC  . HAND SURGERY  2006  . LEFT HEART CATH AND CORONARY ANGIOGRAPHY N/A 06/21/2020   Procedure: LEFT HEART CATH AND CORONARY ANGIOGRAPHY;  Surgeon: Nelva Bush, MD;  Location: [facility] CV LAB;  Service: Cardiovascular;  Laterality: N/A;       Family History  Problem Relation Age of Onset  . Heart disease Father     Social History   Tobacco Use  . Smoking status: Current Every Day Smoker    Packs/day: 0.50    Types: Cigarettes  . Smokeless tobacco: Never Used  Vaping Use  . Vaping Use: Never used  Substance Use Topics  . Alcohol use: Not Currently  . Drug use: Not Currently    Types: Marijuana, Cocaine, IV    Comment: heroin and meth    Home Medications Prior to Admission medications   Medication Sig Start Date End Date Taking? Authorizing Provider  aspirin 81 MG chewable tablet Chew 1 tablet (81 mg total) by mouth daily. 06/22/20 07/22/20  Sanjuan Dame, MD  atorvastatin (LIPITOR) 80 MG tablet Take 1 tablet (80  mg total) by mouth daily. 06/22/20 07/22/20  Sanjuan Dame, MD  buPROPion (WELLBUTRIN XL) 150 MG 24 hr tablet Take 1 tablet (150 mg total) by mouth daily. 06/05/20   Rankin, Shuvon B, NP  clopidogrel (PLAVIX) 75 MG tablet Take 1 tablet (75 mg total) by mouth daily. 06/21/20 07/21/20  Sanjuan Dame, MD  mirtazapine (REMERON) 7.5 MG tablet Take 1 tablet (7.5 mg total) by mouth at bedtime. 06/04/20   Rankin, Shuvon B, NP  pantoprazole (PROTONIX) 40 MG tablet Take 1 tablet (40 mg total) by mouth daily. 06/22/20 07/22/20  Sanjuan Dame, MD  QUEtiapine (SEROQUEL)  25 MG tablet Take 1 tablet (25 mg total) by mouth 3 (three) times daily. 06/04/20   Rankin, Shuvon B, NP  famotidine (PEPCID) 20 MG tablet Take 1 tablet (20 mg total) by mouth 2 (two) times daily. Patient not taking: Reported on 11/04/2019 08/30/19 05/04/20  Joy, Helane Gunther, PA-C  FLUoxetine (PROZAC) 20 MG capsule Take 1 capsule (20 mg total) by mouth daily. For depression Patient not taking: Reported on 11/04/2019 04/01/18 05/04/20  Lindell Spar I, NP  gabapentin (NEURONTIN) 300 MG capsule Take 2 capsules (600 mg total) by mouth 3 (three) times daily. For agitation Patient not taking: Reported on 05/02/2020 03/31/18 05/04/20  Lindell Spar I, NP  sucralfate (CARAFATE) 1 g tablet Take 1 tablet (1 g total) by mouth 4 (four) times daily -  with meals and at bedtime. Patient not taking: Reported on 11/04/2019 08/30/19 05/04/20  Joy, Helane Gunther, PA-C  traZODone (DESYREL) 50 MG tablet Take 1 tablet (50 mg total) by mouth at bedtime as needed for sleep. Patient not taking: Reported on 08/30/2019 03/31/18 08/30/19  Encarnacion Slates, NP    Allergies    Fish allergy and Shellfish allergy  Review of Systems   Review of Systems  Constitutional: Negative for fever.  HENT: Negative for ear pain and sore throat.   Eyes: Negative for visual disturbance.  Respiratory: Positive for shortness of breath. Negative for cough.   Cardiovascular: Positive for chest pain.  Gastrointestinal: Positive for nausea. Negative for abdominal pain, constipation, diarrhea and vomiting.  Genitourinary: Negative for dysuria and hematuria.  Musculoskeletal: Positive for back pain and myalgias.  Skin: Negative for rash.  Neurological: Negative for seizures and syncope.  All other systems reviewed and are negative.   Physical Exam Updated Vital Signs BP (!) 139/111 (BP Location: Right Arm)   Pulse (!) 102   Temp 98.1 F (36.7 C) (Oral)   Resp 16   SpO2 98%   Physical Exam Vitals and nursing note reviewed.  Constitutional:      Appearance: He is  well-developed.  HENT:     Head: Normocephalic and atraumatic.  Eyes:     Conjunctiva/sclera: Conjunctivae normal.  Cardiovascular:     Rate and Rhythm: Normal rate and regular rhythm.     Heart sounds: Normal heart sounds. No murmur heard.   Pulmonary:     Effort: Pulmonary effort is normal. No respiratory distress.     Breath sounds: Normal breath sounds. No decreased breath sounds, wheezing, rhonchi or rales.  Abdominal:     Palpations: Abdomen is soft.     Tenderness: There is no abdominal tenderness.  Musculoskeletal:     Cervical back: Neck supple.     Right lower leg: No tenderness.     Left lower leg: No tenderness.  Skin:    General: Skin is warm and dry.  Neurological:     Mental Status: He  is alert.     ED Results / Procedures / Treatments   Labs (all labs ordered are listed, but only abnormal results are displayed) Labs Reviewed  BASIC METABOLIC PANEL - Abnormal; Notable for the following components:      Result Value   CO2 17 (*)    BUN 42 (*)    Creatinine, Ser 1.86 (*)    GFR calc non Af Amer 46 (*)    GFR calc Af Amer 53 (*)    Anion gap 18 (*)    All other components within normal limits  CBC - Abnormal; Notable for the following components:   WBC 13.2 (*)    All other components within normal limits  URINALYSIS, ROUTINE W REFLEX MICROSCOPIC - Abnormal; Notable for the following components:   APPearance HAZY (*)    Ketones, ur 5 (*)    Protein, ur >=300 (*)    Bacteria, UA FEW (*)    All other components within normal limits  HEPATIC FUNCTION PANEL - Abnormal; Notable for the following components:   Total Protein 8.5 (*)    AST 59 (*)    ALT 56 (*)    Total Bilirubin 1.3 (*)    Indirect Bilirubin 1.1 (*)    All other components within normal limits  CK - Abnormal; Notable for the following components:   Total CK 692 (*)    All other components within normal limits  TROPONIN I (HIGH SENSITIVITY) - Abnormal; Notable for the following  components:   Troponin I (High Sensitivity) 67 (*)    All other components within normal limits  TROPONIN I (HIGH SENSITIVITY) - Abnormal; Notable for the following components:   Troponin I (High Sensitivity) 68 (*)    All other components within normal limits  SARS CORONAVIRUS 2 BY RT PCR [facility] ORDER, [place] LAB)  TROPONIN I (HIGH SENSITIVITY)    EKG EKG Interpretation  Date/Time:  Monday July 02 2020 03:23:36 EDT Ventricular Rate:  90 PR Interval:    QRS Duration: 89 QT Interval:  389 QTC Calculation: 476 R Axis:   94 Text Interpretation: Sinus rhythm Biatrial enlargement Lateral infarct, acute (LAD) similar to earlier today Confirmed by Ezequiel Essex [phone]) on 07/02/2020 3:30:22 AM   Radiology DG Chest 2 View  Result Date: 07/01/2020 CLINICAL DATA:  Chest pain. EXAM: CHEST - 2 VIEW COMPARISON:  June 19, 2020. FINDINGS: The heart size and mediastinal contours are within normal limits. Both lungs are clear. No pneumothorax or pleural effusion is noted. The visualized skeletal structures are unremarkable. IMPRESSION: No active cardiopulmonary disease. Electronically Signed   By: Marijo Conception M.D.   On: 07/01/2020 14:17    Procedures Procedures (including critical care time)  Medications Ordered in ED Medications  sodium chloride 0.9 % bolus 1,000 mL (1,000 mLs Intravenous New Bag/Given 07/02/20 0336)  aspirin chewable tablet 324 mg (324 mg Oral Given 07/02/20 0454)  fentaNYL (SUBLIMAZE) injection 50 mcg (50 mcg Intravenous Given 07/02/20 0454)    ED Course  I have reviewed the triage vital signs and the nursing notes.  Pertinent labs & imaging results that were available during my care of the patient were reviewed by me and considered in my medical decision making (see chart for details).    MDM Rules/Calculators/A&P                          34 y/o M presenting for eval of chest  pain. Recent NSTEMI related to his drug use.    Reviewed/interpreted labs CBC with leukocytosis at 13k BMP with low bicarb at 17, elevated anion gap at 18, AKI with cr at 1.86 Liver enzymes mildly elevated with mildly elevated bilirubin, no focal abd ttp CK marginally elevated Trops elevated but flat  EKG with Sinus rhythm Biatrial enlargement Lateral infarct, acute (LAD) similar to earlier today  CXR  No active cardiopulmonary disease.  4:43 PM CONSULT with Dr. Paticia Stack with cardiology who reviewed the patients current w/u including his EKG. He states pt may have some element of pericarditis following his recent cath and that he would recommend symptomatic tx and treating pt for pericarditis.   Will plan for admission for further tx of AKI and suspected pericarditis.   5:40 AM CONSULT With Dr. Marlowe Sax who accepts patient for admission  Final Clinical Impression(s) / ED Diagnoses Final diagnoses:  AKI (acute kidney injury) [facility])    Rx / [place] Orders ED Discharge Orders    None       Bishop Dublin 07/02/20 0542    Ezequiel Essex, MD 07/02/20 2102

## 2020-07-02 NOTE — ED Notes (Signed)
Patient is refusing second troponin. Patient states he is going to go to registration saying he is suicidal and homicidal because he is not getting a room or fluids. Patient is rude and cussing.

## 2020-07-02 NOTE — H&P (Signed)
History and Physical        Hospital Admission Note Date: 07/02/2020  Patient name: Brian [name] Medical record number: [id number] Date of birth: Sep 16, 1986 Age: 34 y.o. Gender: male  PCP: [facility]  Patient coming from: home  At baseline, ambulates: independently  Chief Complaint    Chief Complaint  Patient presents with  . Chest Pain      HPI:   This is a 34 year old male with past medical history of substance abuse (alcohol, meth, cocaine, IV drug), HFpEF (EF 45 to 50% 06/19/2020) anxiety and depression, nephrolithiasis, peptic ulcers who presented to the ED for evaluation of chest pain.  He was recently discharged from [facility] on 06/21/2020 for NSTEMI believed secondary to amphetamine use and had a cardiac catheterization which showed nonobstructive CAD.  Patient had reported that he used meth night prior to presentation and had recurrence of his pain. He states that he was sober for two years but started using again in late August due to depression and issues with family/friends. He states he wants to go to rehab and wants to quit. Pain is located in the anterior chest, associated with associated nausea with intermittent diaphoresis and lightheadedness.  ED Course: T 98, P106, RR 20, BP 130/85->>> 85/43-> 104/56, SPO2 100% on room air.  Notable labs: BUN 42, creatinine 1.6 (previous 1.12), AG 18, AST 59, ALT 56, indirect bilirubin 1.1, CK 692, troponin 68, COVID-19 negative.  CXR unremarkable.  ED physician discussed with Dr. Paticia Stack, cardiology, who reviewed the patient's chart and EKG and believe the patient may have an element of pericarditis following his recent cath and recommended symptomatic treatment and treating the patient for pericarditis.  He received 1 L NS bolus, fentanyl 50 mcg x 1 and full dose aspirin.  Of note, patient had  refused the second troponin and went to registration saying he was suicidal and homicidal because is not getting arond more fluids being verbally aggressive to staff and threatening to leave to go to [facility].  Additionally, nurse tech noted a white substance on the floor of the triage room.   Vitals:   07/02/20 0900 07/02/20 0945  BP: 109/63 101/65  Pulse: 73 (!) 57  Resp: 16 17  Temp:    SpO2: 99% 99%     Review of Systems:  Review of Systems  Constitutional: Negative for chills and fever.  Respiratory: Negative for shortness of breath and wheezing.   Cardiovascular: Positive for chest pain and leg swelling. Negative for palpitations.  Gastrointestinal: Negative for nausea and vomiting.  Musculoskeletal: Negative for myalgias.  Neurological: Negative for weakness.  Psychiatric/Behavioral: Positive for depression and suicidal ideas.  All other systems reviewed and are negative.   Medical/Social/Family History   Past Medical History: Past Medical History:  Diagnosis Date  . Alcohol abuse   . Anxiety   . Cocaine abuse ([place])   . Depression   . Kidney stones   . Narcotic abuse ([place])   . Peptic ulcer   . Polysubstance abuse ([place])   . Renal disorder    kidney stones    Past Surgical History:  Procedure Laterality Date  . ESOPHAGOGASTRODUODENOSCOPY N/A 05/29/2014   Procedure: ESOPHAGOGASTRODUODENOSCOPY (EGD) ;  Surgeon: Beryle Beams, MD;  Location: [facility] ENDOSCOPY;  Service: Endoscopy;  Laterality: N/A;  check with Dr. Collene Mares about sedation type/timinng - I recommend MAC  . HAND SURGERY  2006  . LEFT HEART CATH AND CORONARY ANGIOGRAPHY N/A 06/21/2020   Procedure: LEFT HEART CATH AND CORONARY ANGIOGRAPHY;  Surgeon: Nelva Bush, MD;  Location: [place] CV LAB;  Service: Cardiovascular;  Laterality: N/A;    Medications: Prior to Admission medications   Medication Sig Start Date End Date Taking? Authorizing Provider  aspirin 81 MG chewable tablet Chew 1 tablet (81 mg total) by  mouth daily. 06/22/20 07/22/20  Sanjuan Dame, MD  atorvastatin (LIPITOR) 80 MG tablet Take 1 tablet (80 mg total) by mouth daily. 06/22/20 07/22/20  Sanjuan Dame, MD  buPROPion (WELLBUTRIN XL) 150 MG 24 hr tablet Take 1 tablet (150 mg total) by mouth daily. 06/05/20   Rankin, Shuvon B, NP  clopidogrel (PLAVIX) 75 MG tablet Take 1 tablet (75 mg total) by mouth daily. 06/21/20 07/21/20  Sanjuan Dame, MD  mirtazapine (REMERON) 7.5 MG tablet Take 1 tablet (7.5 mg total) by mouth at bedtime. 06/04/20   Rankin, Shuvon B, NP  pantoprazole (PROTONIX) 40 MG tablet Take 1 tablet (40 mg total) by mouth daily. 06/22/20 07/22/20  Sanjuan Dame, MD  QUEtiapine (SEROQUEL) 25 MG tablet Take 1 tablet (25 mg total) by mouth 3 (three) times daily. 06/04/20   Rankin, Shuvon B, NP  famotidine (PEPCID) 20 MG tablet Take 1 tablet (20 mg total) by mouth 2 (two) times daily. Patient not taking: Reported on 11/04/2019 08/30/19 05/04/20  Joy, Helane Gunther, PA-C  FLUoxetine (PROZAC) 20 MG capsule Take 1 capsule (20 mg total) by mouth daily. For depression Patient not taking: Reported on 11/04/2019 04/01/18 05/04/20  Lindell Spar I, NP  gabapentin (NEURONTIN) 300 MG capsule Take 2 capsules (600 mg total) by mouth 3 (three) times daily. For agitation Patient not taking: Reported on 05/02/2020 03/31/18 05/04/20  Lindell Spar I, NP  sucralfate (CARAFATE) 1 g tablet Take 1 tablet (1 g total) by mouth 4 (four) times daily -  with meals and at bedtime. Patient not taking: Reported on 11/04/2019 08/30/19 05/04/20  Joy, Helane Gunther, PA-C  traZODone (DESYREL) 50 MG tablet Take 1 tablet (50 mg total) by mouth at bedtime as needed for sleep. Patient not taking: Reported on 08/30/2019 03/31/18 08/30/19  Lindell Spar I, NP    Allergies:   Allergies  Allergen Reactions  . Fish Allergy Anaphylaxis  . Shellfish Allergy Anaphylaxis    Social History:  reports that he has been smoking cigarettes. He has been smoking about 0.50 packs per day. He has never used  smokeless tobacco. He reports previous alcohol use. He reports previous drug use. Drugs: Marijuana, Cocaine, and IV.  Family History: Family History  Problem Relation Age of Onset  . Heart disease Father      Objective   Physical Exam: Blood pressure 101/65, pulse (!) 57, temperature 97.6 F (36.4 C), temperature source Oral, resp. rate 17, SpO2 99 %.  Physical Exam Vitals and nursing note reviewed.  Constitutional:      Appearance: Normal appearance.  HENT:     Head: Normocephalic and atraumatic.  Eyes:     Conjunctiva/sclera: Conjunctivae normal.  Cardiovascular:     Rate and Rhythm: Normal rate and regular rhythm.  Pulmonary:     Effort: Pulmonary effort is normal.     Breath sounds: Normal breath sounds.  Abdominal:     General: Abdomen  is flat.     Palpations: Abdomen is soft.  Musculoskeletal:        General: No swelling or tenderness.  Skin:    Coloration: Skin is not jaundiced or pale.  Neurological:     Mental Status: He is alert. Mental status is at baseline.  Psychiatric:        Mood and Affect: Mood is depressed.        Behavior: Behavior normal.     Comments: Denies suicidal/homicidal plan Has suicidal ideation      LABS on Admission: I have personally reviewed all the labs and imaging below    Basic Metabolic Panel: Recent Labs  Lab 07/01/20 2210  NA 136  K 4.0  CL 101  CO2 17*  GLUCOSE 77  BUN 42*  CREATININE 1.86*  CALCIUM 9.9   Liver Function Tests: Recent Labs  Lab 07/02/20 0348  AST 59*  ALT 56*  ALKPHOS 77  BILITOT 1.3*  PROT 8.5*  ALBUMIN 4.8   No results for input(s): LIPASE, AMYLASE in the last 168 hours. No results for input(s): AMMONIA in the last 168 hours. CBC: Recent Labs  Lab 07/01/20 2210  WBC 13.2*  HGB 16.3  HCT 48.0  MCV 91.4  PLT 294   Cardiac Enzymes: Recent Labs  Lab 07/02/20 0348  CKTOTAL 692*   BNP: Invalid input(s): POCBNP CBG: No results for input(s): GLUCAP in the last 168  hours.  Radiological Exams on Admission:  DG Chest 2 View  Result Date: 07/01/2020 CLINICAL DATA:  Chest pain. EXAM: CHEST - 2 VIEW COMPARISON:  June 19, 2020. FINDINGS: The heart size and mediastinal contours are within normal limits. Both lungs are clear. No pneumothorax or pleural effusion is noted. The visualized skeletal structures are unremarkable. IMPRESSION: No active cardiopulmonary disease. Electronically Signed   By: Marijo Conception M.D.   On: 07/01/2020 14:17      EKG: Independently reviewed.    A & P   Principal Problem:   Chest pain Active Problems:   AKI (acute kidney injury) ([place])   Polysubstance abuse ([place])   Pericarditis   Depression   1. Chest pain, concern for pericarditis likely secondary to drug use and recent cardiac cath a. Afebrile, hemodynamically stable on room air b. EKG read per cardio overnight concerning for possible pericarditis c. Tropnonin 67->68, CK 692. Both down trending from end of August d. LHC 06/21/20 for NSTEMI: no significant obstruction. Recommended DAPT and diltiazem if patient continues to have intermittent angina and drug abstinence. e. Continue statin f. Continue telemetry g. Will give a dose of prednisone due to AKI but patient should probably be discharged on NSAIDs if his renal function improves with outpatient follow up  2. Polysubstance abuse a. UDS b. Encouraged cessation. He is hoping to go to rehab and quit and is requesting resources c. SW consult  3. Depression a. Suicidal ideation without plan b. RN overnight reported suicidal/homicidal ideas c. Continue home meds d. Psych consult  4. AKI a. Received 1L NS bolus b. Encourage PO intake c. Holding NSAIDs for now   DVT prophylaxis: lovenox   Code Status: Full Code  Diet: heart healthy Family Communication: Admission, patients condition and plan of care including tests being ordered have been discussed with the patient who indicates understanding and agrees  with the plan and Code Status.  Disposition Plan: The appropriate patient status for this patient is OBSERVATION. Observation status is judged to be reasonable and necessary in order to  provide the required intensity of service to ensure the patient's safety. The patient's presenting symptoms, physical exam findings, and initial radiographic and laboratory data in the context of their medical condition is felt to place them at decreased risk for further clinical deterioration. Furthermore, it is anticipated that the patient will be medically stable for discharge from the hospital within 2 midnights of admission. The following factors support the patient status of observation.   " The patient's presenting symptoms include chest pain. " The physical exam findings include unremarkable. " The initial radiographic and laboratory data are EKG with pericarditis.    Status is: Observation  The patient remains OBS appropriate and will d/c before 2 midnights.  Dispo: The patient is from: Home              Anticipated d/c is to: Home              Anticipated d/c date is: 1 day              Patient currently is not medically stable to d/c.     Consultants  . ED physician discussed with Cardio  Procedures  . none  Time Spent on Admission: 65 minutes    Harold Hedge, DO [facility] Pager [phone] 07/02/2020, 10:45 AM

## 2020-07-02 NOTE — ED Notes (Signed)
Dr. Quintella Baton patient can go back to the lobby.

## 2020-07-02 NOTE — Progress Notes (Signed)
  Echocardiogram 2D Echocardiogram has been performed.  Brian [name] 07/02/2020, 2:06 PM

## 2020-07-02 NOTE — ED Notes (Signed)
Writer was attempting to recollect TIMED troponin because it is elevated , patient claimed that blood draws have been hurting and his veins are sensitive, had to stick patient in his knuckle with a little 25G needle because he has a history of IV drug use an his veins are no good, I had to stick him in his wrist earlier for blood work. Patient stated that if he said that if he stated he was suicidal he would be back in a room already and that he is leaving here and going to [facility] and tell him he is suicidal because we are not going to do anything for him here. Also when patient left triage room 2, there was a suspicious white powder substance on the floor of the triage room, both seen by Bonnie/ EMT and Kat/ NT. Triage nurse made aware about white substance on the floor

## 2020-07-03 ENCOUNTER — Encounter (HOSPITAL_COMMUNITY): Payer: Self-pay | Admitting: Internal Medicine

## 2020-07-03 NOTE — ED Notes (Signed)
New EKG was reviewed by Dr. Posey Pronto, no interventions needed at this time. Will continue to monitor.

## 2020-07-03 NOTE — ED Notes (Signed)
0600 EKG completed

## 2020-07-03 NOTE — ED Notes (Signed)
RN to call back when able to take report

## 2020-07-03 NOTE — Progress Notes (Signed)
PROGRESS NOTE  Brian [name] JME:[id number] DOB: [birth year] DOA: 07/02/2020 PCP: Beverly Sessions  Brief History   This is a 34 year old male with past medical history of substance abuse (alcohol, meth, cocaine, IV drug), HFpEF (EF 45 to 50% 06/19/2020) anxiety and depression, nephrolithiasis, peptic ulcers who presented to the ED for evaluation of chest pain.  He was recently discharged from [facility] on 06/21/2020 for NSTEMI believed secondary to amphetamine use and had a cardiac catheterization which showed nonobstructive CAD.  Patient had reported that he used meth night prior to presentation and had recurrence of his pain. He states that he was sober for two years but started using again in late August due to depression and issues with family/friends. He states he wants to go to rehab and wants to quit. Pain is located in the anterior chest, associated with associated nausea with intermittent diaphoresis and lightheadedness.  ED Course: T 98, P106, RR 20, BP 130/85->>> 85/43-> 104/56, SPO2 100% on room air.  Notable labs: BUN 42, creatinine 1.6 (previous 1.12), AG 18, AST 59, ALT 56, indirect bilirubin 1.1, CK 692, troponin 68, COVID-19 negative.  CXR unremarkable.  ED physician discussed with Dr. Paticia Stack, cardiology, who reviewed the patient's chart and EKG and believe the patient may have an element of pericarditis following his recent cath and recommended symptomatic treatment and treating the patient for pericarditis.  He received 1 L NS bolus, fentanyl 50 mcg x 1 and full dose aspirin.  Of note, patient had refused the second troponin and went to registration saying he was suicidal and homicidal because is not getting arond more fluids being verbally aggressive to staff and threatening to leave to go to [facility].  Additionally, nurse tech noted a white substance on the floor of the triage room.  Consultants  . Cardiology  Procedures  . None  Antibiotics    Anti-infectives (From admission, onward)   None    .  Subjective  The patient is resting quietly. He states that the chest pain is worst when he lies back.  Objective   Vitals:  Vitals:   07/03/20 1400 07/03/20 1458  BP: 108/63 116/72  Pulse: 91 74  Resp: (!) 21 20  Temp: 98.2 F (36.8 C) 98.3 F (36.8 C)  SpO2: 98% 100%   Exam:  Constitutional:  . The patient is awake, alert, and oriented x 3. Mild distress from chest pain. Respiratory:  . No increased work of breathing. . No wheezes, rales, or rhonchi . No tactile fremitus Cardiovascular:  . Regular rate and rhythm . No murmurs, ectopy, or gallups. . No lateral PMI. No thrills. Abdomen:  . Abdomen is soft, non-tender, non-distended . No hernias, masses, or organomegaly . Normoactive bowel sounds.  Musculoskeletal:  . No cyanosis, clubbing, or edema Skin:  . No rashes, lesions, ulcers . palpation of skin: no induration or nodules Neurologic:  . CN 2-12 intact . Sensation all 4 extremities intact Psychiatric:  . Mental status o Mood, affect appropriate o Orientation to person, place, time  . judgment and insight appear intact   I have personally reviewed the following:   Today's Data  . Vitals, creatinine, WBC  Imaging  . Chest x-ray  Cardiology Data  . Echocardiogram  Scheduled Meds: . aspirin  81 mg Oral Daily  . atorvastatin  80 mg Oral Daily  . buPROPion  150 mg Oral Daily  . clopidogrel  75 mg Oral Daily  . colchicine  0.6 mg Oral BID  .  enoxaparin (LOVENOX) injection  40 mg Subcutaneous Q24H  . mirtazapine  7.5 mg Oral QHS  . pantoprazole  40 mg Oral Daily  . QUEtiapine  25 mg Oral TID   Continuous Infusions:  Principal Problem:   Chest pain Active Problems:   AKI (acute kidney injury) ([place])   Polysubstance abuse ([place])   Pericarditis   Depression   LOS: 0 days   A & P  1. Chest pain, concern for pericarditis likely secondary to drug use and recent cardiac cath: Cardiac  MRI is pending.  a. Afebrile, hemodynamically stable on room air b. EKG read per cardio overnight concerning for possible pericarditis c. Tropnonin 67->68, CK 692. Both down trending from end of August d. LHC 06/21/20 for NSTEMI: no significant obstruction. Recommended DAPT and diltiazem if patient continues to have intermittent angina and drug abstinence. e. Continue statin f. Continue telemetry g. Will give a dose of prednisone due to AKI but patient should probably be discharged on NSAIDs if his renal function improves with outpatient follow up  2. Polysubstance abuse a. UDS is positive for amphetamines b. Encouraged cessation. He is hoping to go to rehab and quit and is requesting resources c. SW consult  3. Depression a. Suicidal ideation without plan b. RN overnight reported suicidal/homicidal ideas c. Continue home meds d. Psych consult  4. AKI a. Received 1L NS bolus b. Encourage PO intake c. Holding NSAIDs for now   DVT prophylaxis: lovenox   Code Status: Full Code  Diet: heart healthy Family Communication: Admission, patients condition and plan of care including tests being ordered have been discussed with the patient who indicates understanding and agrees with the plan and Code Status.  Disposition Plan:     Status is: Inpatient  The patient remains inpatient due to the need for ongoing medical infestigation and treatment that is not amenable to outpatient treatment.  Dispo: The patient is from: Home  Anticipated d/c is to: Home  Anticipated d/c date is: 1 day  Patient currently is not medically stable to d/c.   Clevester Helzer, DO [facility] Direct contact: see [email or web address]  7PM-7AM contact night coverage as above 07/03/2020, 6:58 PM  LOS: 0 days

## 2020-07-03 NOTE — Progress Notes (Signed)
RN called and received report

## 2020-07-03 NOTE — Progress Notes (Addendum)
Progress Note  Patient Name: Brian [name] Date of Encounter: 07/03/2020  [facility] Cardiologist: Belva Crome III, MD   Subjective   Pt continues to complain of chest pain that is worse when recumbent, was asleep when I entered the room.  Inpatient Medications    Scheduled Meds: . aspirin  81 mg Oral Daily  . atorvastatin  80 mg Oral Daily  . buPROPion  150 mg Oral Daily  . clopidogrel  75 mg Oral Daily  . colchicine  0.6 mg Oral BID  . enoxaparin (LOVENOX) injection  40 mg Subcutaneous Q24H  . mirtazapine  7.5 mg Oral QHS  . pantoprazole  40 mg Oral Daily  . QUEtiapine  25 mg Oral TID   Continuous Infusions:  PRN Meds: acetaminophen, ondansetron (ZOFRAN) IV   Vital Signs    Vitals:   07/03/20 0602 07/03/20 0630 07/03/20 0700 07/03/20 0800  BP: 103/63 110/62 113/76 109/60  Pulse: 72 (!) 54 (!) 58 (!) 54  Resp: 17 11 11 13   Temp:      TempSrc:      SpO2: 96% 96% 97% 96%    Intake/Output Summary (Last 24 hours) at 07/03/2020 0812 Last data filed at 07/02/2020 0955 Gross per 24 hour  Intake 999 ml  Output --  Net 999 ml   Last 3 Weights 06/21/2020 06/20/2020 06/19/2020  Weight (lbs) 167 lb 8 oz 164 lb 4.8 oz 156 lb 12.8 oz  Weight (kg) 75.978 kg 74.526 kg 71.124 kg  Some encounter information is confidential and restricted. Go to Review Flowsheets activity to see all data.      Telemetry    Sinus to sinus bradycardia HR 50-70s - Personally Reviewed  ECG    Sinus arrhythm with HR 59 diffuse mild ST elevation  - Personally Reviewed  Physical Exam   GEN: No acute distress.   Neck: No JVD Cardiac: RRR, no murmurs, rubs, or gallops.  Respiratory: Clear to auscultation bilaterally. GI: Soft, nontender, non-distended  MS: No edema; No deformity. Neuro:  Nonfocal  Psych: Normal affect   Labs    High Sensitivity Troponin:   Recent Labs  Lab 06/19/20 1636 06/19/20 2158 07/01/20 2210 07/02/20 0348 07/02/20 1050  TROPONINIHS 2,148* 1,715* 67*  68* 78*      Chemistry Recent Labs  Lab 07/01/20 2210 07/02/20 0348 07/02/20 1050  NA 136  --   --   K 4.0  --   --   CL 101  --   --   CO2 17*  --   --   GLUCOSE 77  --   --   BUN 42*  --   --   CREATININE 1.86*  --  1.22  CALCIUM 9.9  --   --   PROT  --  8.5*  --   ALBUMIN  --  4.8  --   AST  --  59*  --   ALT  --  56*  --   ALKPHOS  --  77  --   BILITOT  --  1.3*  --   GFRNONAA 46*  --  >60  GFRAA 53*  --  >60  ANIONGAP 18*  --   --      Hematology Recent Labs  Lab 07/01/20 2210 07/02/20 1050  WBC 13.2* 14.1*  RBC 5.25 4.39  HGB 16.3 13.4  HCT 48.0 40.3  MCV 91.4 91.8  MCH 31.0 30.5  MCHC 34.0 33.3  RDW 13.2 13.2  PLT 294  238    BNPNo results for input(s): BNP, PROBNP in the last 168 hours.   DDimer No results for input(s): DDIMER in the last 168 hours.   Radiology    DG Chest 2 View  Result Date: 07/01/2020 CLINICAL DATA:  Chest pain. EXAM: CHEST - 2 VIEW COMPARISON:  June 19, 2020. FINDINGS: The heart size and mediastinal contours are within normal limits. Both lungs are clear. No pneumothorax or pleural effusion is noted. The visualized skeletal structures are unremarkable. IMPRESSION: No active cardiopulmonary disease. Electronically Signed   By: Marijo Conception M.D.   On: 07/01/2020 14:17   ECHOCARDIOGRAM LIMITED  Result Date: 07/02/2020    ECHOCARDIOGRAM LIMITED REPORT   Patient Name:   Brian [name] Date of Exam: 07/02/2020 Medical Rec #:  [id number]          Height:       68.0 in Accession #:    [id number]         Weight:       167.5 lb Date of Birth:  1986/04/28          BSA:          1.895 m Patient Age:    34 years           BP:           100/64 mmHg Patient Gender: M                  HR:           58 bpm. Exam Location:  Inpatient Procedure: Limited Echo and Cardiac Doppler Indications:    Chest Pain 786.50 / R07.9  History:        Patient has prior history of Echocardiogram examinations, most                 recent 06/19/2020. Risk  Factors:Current Smoker.  Sonographer:    Vickie Epley RDCS Referring Phys: [id number] [place]  1. Limited echo no color/doppler EF is normal previously described RWMAls not apparent Apex not well seen.  2. Left ventricular ejection fraction, by estimation, is 55 to 60%. The left ventricle has normal function. The left ventricle has no regional wall motion abnormalities.  3. Right ventricular systolic function is normal. The right ventricular size is normal.  4. The mitral valve is normal in structure. No evidence of mitral valve regurgitation. No evidence of mitral stenosis.  5. The aortic valve is normal in structure. Aortic valve regurgitation is not visualized. No aortic stenosis is present.  6. The inferior vena cava is normal in size with greater than 50% respiratory variability, suggesting right atrial pressure of 3 mmHg. FINDINGS  Left Ventricle: Left ventricular ejection fraction, by estimation, is 55 to 60%. The left ventricle has normal function. The left ventricle has no regional wall motion abnormalities. The left ventricular internal cavity size was normal in size. There is  no left ventricular hypertrophy. Right Ventricle: The right ventricular size is normal. No increase in right ventricular wall thickness. Right ventricular systolic function is normal. Left Atrium: Left atrial size was normal in size. Right Atrium: Right atrial size was normal in size. Pericardium: There is no evidence of pericardial effusion. Mitral Valve: The mitral valve is normal in structure. Normal mobility of the mitral valve leaflets. No evidence of mitral valve stenosis. Tricuspid Valve: The tricuspid valve is normal in structure. Tricuspid valve regurgitation is not demonstrated. No evidence of tricuspid stenosis. Aortic Valve: The  aortic valve is normal in structure. Aortic valve regurgitation is not visualized. No aortic stenosis is present. Pulmonic Valve: The pulmonic valve was normal in structure.  Pulmonic valve regurgitation is not visualized. No evidence of pulmonic stenosis. Aorta: The aortic root is normal in size and structure. Venous: The inferior vena cava is normal in size with greater than 50% respiratory variability, suggesting right atrial pressure of 3 mmHg. IAS/Shunts: No atrial level shunt detected by color flow Doppler. Additional Comments: Limited echo no color/doppler EF is normal previously described RWMAls not apparent Apex not well seen. LEFT VENTRICLE PLAX 2D LVIDd:         4.10 cm LVIDs:         2.55 cm LV PW:         1.00 cm LV IVS:        0.80 cm  LEFT ATRIUM         Index LA diam:    3.00 cm 1.58 cm/m Jenkins Rouge MD Electronically signed by Jenkins Rouge MD Signature Date/Time: 07/02/2020/2:54:12 PM    Final     Cardiac Studies   Echo 07/02/20: 1. Limited echo no color/doppler EF is normal previously described RWMAls  not apparent Apex not well seen.  2. Left ventricular ejection fraction, by estimation, is 55 to 60%. The  left ventricle has normal function. The left ventricle has no regional  wall motion abnormalities.  3. Right ventricular systolic function is normal. The right ventricular  size is normal.  4. The mitral valve is normal in structure. No evidence of mitral valve  regurgitation. No evidence of mitral stenosis.  5. The aortic valve is normal in structure. Aortic valve regurgitation is  not visualized. No aortic stenosis is present.  6. The inferior vena cava is normal in size with greater than 50%  respiratory variability, suggesting right atrial pressure of 3 mmHg.    Left heart cath 06/21/20: Conclusions: 1. No angiographically significant atherosclerotic coronary artery disease.  Moderate transient stenosis at the ostium of the RCA resolved with intracoronary nitroglycerin and is consistent with vasospasm. 2. Sluggish flow in the distal LAD, which may reflect an element of microvascular dysfunction. 3. Mildly elevated left ventricular  filling pressure.  Recommendations: 1. Encourage abstinence from drugs and tobacco. 2. Dual antiplatelet therapy with aspirin and clopidogrel for medical management of MINOCA. 3. If blood pressure tolerates, low-dose diltiazem could be considered if the patient continues to have intermittent angina.   Patient Profile     34 y.o. male hx of polysubstance abuse, (methamphetamine, cocaine, heroin), prior IVDU, hepatitis C, cirrhosis, GERD, prior homelessness, malingering, bipolar disorder, current smoker, and alcohol abuse recently admitted for NSTEMI in the setting of amphetamine use. LHC revealed no significant CAD but RCA vasospasm. Presented again ER for epigastric pain after using amphetamine again.  Assessment & Plan    Chest pain Hx of recent NSTEMI - recent admission for chest pain after drug use with elevated troponin - heart cath with no significant CAD, but ostial RCA vasospasm - hs troponin 68 --> 78 - sed rate WNL, CRP pending - CK elevated to 692 - chest pain felt consistent with pericarditis and he was given prednisone instead of colchicine for elevated creatinine - started on colchicine last evening - 0.6 mg BID - echo with EF improved from last admission to 55-60% with resolved RWMA - case reviewed with attending - if CRP is normal, unclear that inpatient cardiac MRI would significantly change our treatment strategy given low  and flat troponin - appreciate CM consult for colchicine - will not be able to afford without insurance - [facility] may be a good resource for him - will start ibuprofen to see if this helps with his chest pain    Hx of cocaine, methamphetamine, tobacco, and ETOH abuse - knows he needs to quit   I will arrange cardiology follow up.       For questions or updates, please contact [place] Please consult [email or web address] for contact info under        Signed, Ledora Bottcher, PA  07/03/2020, 8:12 AM    Patient seen  and examined with ADuke PA.  Agree as above, with the following exceptions and changes as noted below. Continues to have chest pain but appears comfortable. Good insight into his situation and is looking to go to rehab for substance use. Gen: NAD, CV: RRR, no murmurs, Lungs: clear, Abd: soft, Extrem: Warm, well perfused, no edema, Neuro/Psych: alert and oriented x 3, normal mood and affect. All available labs, radiology testing, previous records reviewed. Echo findings, natural history of pericarditis, clinical course, as well as likelihood of progression to chronic pericarditis reviewed with patient. Treatment options, duration of therapy and risk of recurrence discussed in detail. We can continue colchicine 0.6 mg BID x 3 mo and would consider addition of ibuprofen 600 mg TID x 1 mo, concern for renal function but this will be limited therapy. Sed rate is normal, crp pending, however that would be unusual if this is acute pericarditis. Troponin only mildly elevated, less suggestive of myocarditis. If pain persists as outpatient, would consider cardiac MRI as outpatient. We will arrange follow up.  Elouise Munroe, MD 07/03/20 5:07 PM

## 2020-07-04 ENCOUNTER — Other Ambulatory Visit: Payer: Self-pay

## 2020-07-04 ENCOUNTER — Encounter: Payer: Self-pay | Admitting: Gastroenterology

## 2020-07-04 LAB — CBC WITH DIFFERENTIAL/PLATELET
Abs Immature Granulocytes: 0.04 10*3/uL (ref 0.00–0.07)
Basophils Absolute: 0.1 10*3/uL (ref 0.0–0.1)
Basophils Relative: 1 %
Eosinophils Absolute: 0.2 10*3/uL (ref 0.0–0.5)
Eosinophils Relative: 2 %
HCT: 39.2 % (ref 39.0–52.0)
Hemoglobin: 13.2 g/dL (ref 13.0–17.0)
Immature Granulocytes: 0 %
Lymphocytes Relative: 38 %
Lymphs Abs: 3.4 10*3/uL (ref 0.7–4.0)
MCH: 30.9 pg (ref 26.0–34.0)
MCHC: 33.7 g/dL (ref 30.0–36.0)
MCV: 91.8 fL (ref 80.0–100.0)
Monocytes Absolute: 0.9 10*3/uL (ref 0.1–1.0)
Monocytes Relative: 10 %
Neutro Abs: 4.5 10*3/uL (ref 1.7–7.7)
Neutrophils Relative %: 49 %
Platelets: 218 10*3/uL (ref 150–400)
RBC: 4.27 MIL/uL (ref 4.22–5.81)
RDW: 13.3 % (ref 11.5–15.5)
WBC: 9 10*3/uL (ref 4.0–10.5)
nRBC: 0 % (ref 0.0–0.2)

## 2020-07-04 LAB — BASIC METABOLIC PANEL
Anion gap: 9 (ref 5–15)
BUN: 18 mg/dL (ref 6–20)
CO2: 24 mmol/L (ref 22–32)
Calcium: 8.7 mg/dL — ABNORMAL LOW (ref 8.9–10.3)
Chloride: 106 mmol/L (ref 98–111)
Creatinine, Ser: 1.05 mg/dL (ref 0.61–1.24)
GFR calc Af Amer: >60 mL/min (ref >60)
GFR calc non Af Amer: >60 mL/min (ref >60)
Glucose, Bld: 91 mg/dL (ref 70–99)
Potassium: 4.1 mmol/L (ref 3.5–5.1)
Sodium: 139 mmol/L (ref 135–145)

## 2020-07-04 MED ORDER — COLCHICINE 0.6 MG PO TABS: 0.6000 mg | ORAL_TABLET | Freq: Two times a day (BID) | ORAL | 2 refills | Status: DC

## 2020-07-04 MED ORDER — PANTOPRAZOLE SODIUM 40 MG PO TBEC: 40.0000 mg | DELAYED_RELEASE_TABLET | Freq: Two times a day (BID) | ORAL | 0 refills | Status: DC

## 2020-07-04 MED ORDER — PROSOURCE PLUS PO LIQD: 30.0000 mL | Freq: Two times a day (BID) | ORAL | Status: DC

## 2020-07-04 MED ORDER — PROSOURCE PLUS PO LIQD: 30.0000 mL | Freq: Two times a day (BID) | ORAL | 0 refills | Status: DC

## 2020-07-04 MED ORDER — ADULT MULTIVITAMIN W/MINERALS CH: 1.0000 | ORAL_TABLET | Freq: Every day | ORAL | Status: DC

## 2020-07-04 MED ORDER — ADULT MULTIVITAMIN W/MINERALS CH: 1.0000 | ORAL_TABLET | Freq: Every day | ORAL | 0 refills | Status: DC

## 2020-07-04 NOTE — Progress Notes (Addendum)
Patient is c/o 6/10 chest "pressure". Has progressed to this pain level. Skin warm/dry. No c/o nausea or vomiting. No c/o SOB. VSS. Having continuous belching with no relief and states he has been having this. He states he has had some diarrhea as well. Also he is c/o "numbness/tingling" in left toes. "Like when your leg falls asleep". Able to bear weight and ambulate with no problems. EKG obtained and Md notified. Eulas Post, RN

## 2020-07-04 NOTE — Progress Notes (Signed)
Patient very agitated and belligerent about leaving. Will not stay to go over discharge instructions or escort out. Copy of instructions given to patient. States "no one has done shit and I guess I'll go over to [facility]". Patient left by himself.Eulas Post, RN

## 2020-07-04 NOTE — Clinical Social Work Note (Addendum)
CSW attempted to provide patient with outpatient substance abuse resources per his request, per nurse's note, patient left hospital saying "no one has done shit and I guess I'll go over to [facility]".  Patient left before CSW was able to provide resources for patient.  Jones Broom. Ryleigh Esqueda, MSW, LCSW [phone]  07/04/2020 1:44 PM

## 2020-07-04 NOTE — Progress Notes (Addendum)
Progress Note  Patient Name: Brian [name] Date of Encounter: 07/04/2020  [facility] Cardiologist: Sinclair Grooms, MD   Subjective   Continues to complain of nonspecific chest pain. States he wants to go to behavioral health.  Inpatient Medications    Scheduled Meds: . aspirin  81 mg Oral Daily  . atorvastatin  80 mg Oral Daily  . buPROPion  150 mg Oral Daily  . clopidogrel  75 mg Oral Daily  . colchicine  0.6 mg Oral BID  . enoxaparin (LOVENOX) injection  40 mg Subcutaneous Q24H  . mirtazapine  7.5 mg Oral QHS  . pantoprazole  40 mg Oral Daily  . QUEtiapine  25 mg Oral TID   Continuous Infusions:  PRN Meds: acetaminophen, ondansetron (ZOFRAN) IV   Vital Signs    Vitals:   07/03/20 2129 07/04/20 0211 07/04/20 0547 07/04/20 1030  BP: 115/71 (!) 92/46 100/66 123/75  Pulse: 68 (!) 54 (!) 54 (!) 55  Resp: 13 18 16    Temp: 98.4 F (36.9 C) 98.1 F (36.7 C) 97.8 F (36.6 C) 98.3 F (36.8 C)  TempSrc:    Oral  SpO2: 97% 100% 100% 99%  Weight:      Height:        Intake/Output Summary (Last 24 hours) at 07/04/2020 1227 Last data filed at 07/03/2020 1800 Gross per 24 hour  Intake 320 ml  Output --  Net 320 ml   Last 3 Weights 07/03/2020 06/21/2020 06/20/2020  Weight (lbs) 167 lb 7.7 oz 167 lb 8 oz 164 lb 4.8 oz  Weight (kg) 75.97 kg 75.978 kg 74.526 kg  Some encounter information is confidential and restricted. Go to Review Flowsheets activity to see all data.      Telemetry    Sinus bradycardia 50s overnight, sinus rhythm in the 60-70s today - Personally Reviewed  ECG    Sinus arrhythmia HR 68, right axis deviation - Personally Reviewed  Physical Exam   GEN: No acute distress.   Neck: No JVD Cardiac: RRR, no murmurs, rubs, or gallops.  Respiratory: Clear to auscultation bilaterally. GI: Soft, nontender, non-distended  MS: No edema; No deformity. Neuro:  Nonfocal  Psych: Normal affect   Labs    High Sensitivity Troponin:   Recent Labs   Lab 06/19/20 1636 06/19/20 2158 07/01/20 2210 07/02/20 0348 07/02/20 1050  TROPONINIHS 2,148* 1,715* 67* 68* 78*      Chemistry Recent Labs  Lab 07/01/20 2210 07/02/20 0348 07/02/20 1050 07/04/20 0513  NA 136  --   --  139  K 4.0  --   --  4.1  CL 101  --   --  106  CO2 17*  --   --  24  GLUCOSE 77  --   --  91  BUN 42*  --   --  18  CREATININE 1.86*  --  1.22 1.05  CALCIUM 9.9  --   --  8.7*  PROT  --  8.5*  --   --   ALBUMIN  --  4.8  --   --   AST  --  59*  --   --   ALT  --  56*  --   --   ALKPHOS  --  77  --   --   BILITOT  --  1.3*  --   --   GFRNONAA 46*  --  >60 >60  GFRAA 53*  --  >60 >60  ANIONGAP 18*  --   --  9     Hematology Recent Labs  Lab 07/01/20 2210 07/02/20 1050 07/04/20 0513  WBC 13.2* 14.1* 9.0  RBC 5.25 4.39 4.27  HGB 16.3 13.4 13.2  HCT 48.0 40.3 39.2  MCV 91.4 91.8 91.8  MCH 31.0 30.5 30.9  MCHC 34.0 33.3 33.7  RDW 13.2 13.2 13.3  PLT 294 238 218    BNPNo results for input(s): BNP, PROBNP in the last 168 hours.   DDimer No results for input(s): DDIMER in the last 168 hours.   Radiology    ECHOCARDIOGRAM LIMITED  Result Date: 07/02/2020    ECHOCARDIOGRAM LIMITED REPORT   Patient Name:   Brian [name] Date of Exam: 07/02/2020 Medical Rec #:  [id number]          Height:       68.0 in Accession #:    [id number]         Weight:       167.5 lb Date of Birth:  10-23-1986          BSA:          1.895 m Patient Age:    34 years           BP:           100/64 mmHg Patient Gender: M                  HR:           58 bpm. Exam Location:  Inpatient Procedure: Limited Echo and Cardiac Doppler Indications:    Chest Pain 786.50 / R07.9  History:        Patient has prior history of Echocardiogram examinations, most                 recent 06/19/2020. Risk Factors:Current Smoker.  Sonographer:    Vickie Epley RDCS Referring Phys: [id number] [place]  1. Limited echo no color/doppler EF is normal previously described RWMAls not apparent  Apex not well seen.  2. Left ventricular ejection fraction, by estimation, is 55 to 60%. The left ventricle has normal function. The left ventricle has no regional wall motion abnormalities.  3. Right ventricular systolic function is normal. The right ventricular size is normal.  4. The mitral valve is normal in structure. No evidence of mitral valve regurgitation. No evidence of mitral stenosis.  5. The aortic valve is normal in structure. Aortic valve regurgitation is not visualized. No aortic stenosis is present.  6. The inferior vena cava is normal in size with greater than 50% respiratory variability, suggesting right atrial pressure of 3 mmHg. FINDINGS  Left Ventricle: Left ventricular ejection fraction, by estimation, is 55 to 60%. The left ventricle has normal function. The left ventricle has no regional wall motion abnormalities. The left ventricular internal cavity size was normal in size. There is  no left ventricular hypertrophy. Right Ventricle: The right ventricular size is normal. No increase in right ventricular wall thickness. Right ventricular systolic function is normal. Left Atrium: Left atrial size was normal in size. Right Atrium: Right atrial size was normal in size. Pericardium: There is no evidence of pericardial effusion. Mitral Valve: The mitral valve is normal in structure. Normal mobility of the mitral valve leaflets. No evidence of mitral valve stenosis. Tricuspid Valve: The tricuspid valve is normal in structure. Tricuspid valve regurgitation is not demonstrated. No evidence of tricuspid stenosis. Aortic Valve: The aortic valve is normal in structure. Aortic valve regurgitation is not visualized. No  aortic stenosis is present. Pulmonic Valve: The pulmonic valve was normal in structure. Pulmonic valve regurgitation is not visualized. No evidence of pulmonic stenosis. Aorta: The aortic root is normal in size and structure. Venous: The inferior vena cava is normal in size with greater  than 50% respiratory variability, suggesting right atrial pressure of 3 mmHg. IAS/Shunts: No atrial level shunt detected by color flow Doppler. Additional Comments: Limited echo no color/doppler EF is normal previously described RWMAls not apparent Apex not well seen. LEFT VENTRICLE PLAX 2D LVIDd:         4.10 cm LVIDs:         2.55 cm LV PW:         1.00 cm LV IVS:        0.80 cm  LEFT ATRIUM         Index LA diam:    3.00 cm 1.58 cm/m Jenkins Rouge MD Electronically signed by Jenkins Rouge MD Signature Date/Time: 07/02/2020/2:54:12 PM    Final     Cardiac Studies   Echo 07/02/20: 1. Limited echo no color/doppler EF is normal previously described RWMAls not apparent Apex not well seen.  2. Left ventricular ejection fraction, by estimation, is 55 to 60%. The  left ventricle has normal function. The left ventricle has no regional  wall motion abnormalities.  3. Right ventricular systolic function is normal. The right ventricular  size is normal.  4. The mitral valve is normal in structure. No evidence of mitral valve  regurgitation. No evidence of mitral stenosis.  5. The aortic valve is normal in structure. Aortic valve regurgitation is  not visualized. No aortic stenosis is present.  6. The inferior vena cava is normal in size with greater than 50%  respiratory variability, suggesting right atrial pressure of 3 mmHg.    Left heart cath 06/21/20: Conclusions: 1. No angiographically significant atherosclerotic coronary artery disease. Moderate transient stenosis at the ostium of the RCA resolved with intracoronary nitroglycerin and is consistent with vasospasm. 2. Sluggish flow in the distal LAD, which may reflect an element of microvascular dysfunction. 3. Mildly elevated left ventricular filling pressure.  Recommendations: 1. Encourage abstinence from drugs and tobacco. 2. Dual antiplatelet therapy with aspirin and clopidogrel for medical management of MINOCA. 3. If blood pressure  tolerates, low-dose diltiazem could be considered if the patient continues to have intermittent angina.  Patient Profile     34 y.o. male hx of polysubstance abuse, (methamphetamine, cocaine, heroin), prior IVDU, hepatitis C, cirrhosis, GERD, prior homelessness, malingering, bipolar disorder, current smoker, and alcohol abuserecently admitted for NSTEMI in the setting of amphetamine use. LHC revealed no significant CAD but RCA vasospasm. Presented again ER for epigastric pain after using amphetamine again.  Assessment & Plan    Acute pericarditis Recent NSTEMI - CRP still pending - sCr has normalized - continue colchicine 0.6 mg BID x 3 months + 600 mg ibuprofen TID x 1 month - I have arranged cardiology follow up   Elevated CK - in the setting of low, flat troponin, low suspicion this reflects ischemia. - recent heart cath with no obstructive disease, possible coronary spasm - recommend follow up with PCP, possible neurology referral if myopathy suspected   Polysubstance abuse - needs cessation   AKI - resolved   [facility] will sign off.   Medication Recommendations:  Medications as above Other recommendations (labs, testing, etc):  none Follow up as an outpatient:  appt has been arranged with cardiology  For questions or updates,  please contact [place] Please consult [email or web address] for contact info under        Signed, Ledora Bottcher, PA  07/04/2020, 12:27 PM    Patient seen and examined with Doreene Adas PA.  Agree as above, with the following exceptions and changes as noted below. Gen: agitated, CV: RRR, no murmurs, Lungs: clear, Abd: soft, Extrem: Warm, well perfused, no edema, Neuro/Psych: alert and oriented x 3, normal mood and affect. All available labs, radiology testing, previous records reviewed. Would conitnue therapy for possible pericarditis. Sed rate normal. Despite being ordered, CRP never drawn. Patient has now left hospital. Would recommend  follow up and can evaluate symptoms at that time. Behavioral modification required for improvement in symptoms.   Elouise Munroe, MD 07/04/20 2:08 PM

## 2020-07-04 NOTE — Progress Notes (Signed)
Initial Nutrition Assessment  RD working remotely.  DOCUMENTATION CODES:   Not applicable  INTERVENTION:  - will order 30 ml Prosource Plus BID, each supplement provides 100 kcal and 15 grams protein. - will order 1 tablet multivitamin with minerals/day.   NUTRITION DIAGNOSIS:   Increased nutrient needs related to acute illness as evidenced by estimated needs.  GOAL:   Patient will meet greater than or equal to 90% of their needs  MONITOR:   PO intake, Supplement acceptance, Labs, Weight trends  REASON FOR ASSESSMENT:   Malnutrition Screening Tool  ASSESSMENT:   34 year old male with medical history of substance abuse (alcohol, meth, cocaine, IV drug), HFpEF, anxiety and depression, nephrolithiasis, and peptic ulcers. He presented to the ED for evaluation of chest pain. He was discharged from [facility] on 8/26 following admission for NSTEMI thought to be 2/2 amphetamine use. 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 He reported he was sober for two years but started using again in late August d/t depression and issues with family/friends.  No intakes documented since admission. Discharge order to home entered a few minutes ago; discharge summary not yet entered.   Patient was seen by a RD at [facility] 2 weeks ago. At that time, NFPE showed no signs of muscle or fat depletions.   Weight yesterday was 167 lb and weight has been stable stable over the past 1 month.    Labs reviewed; Ca: 8.7 mg/dl. Medications reviewed; 40 mg oral protonix/day.     NUTRITION - FOCUSED PHYSICAL EXAM:  unable to complete at this time.  Diet Order:   Diet Order            Diet - low sodium heart healthy           Diet Heart Room service appropriate? Yes; Fluid consistency: Thin  Diet effective now                 EDUCATION NEEDS:   Not appropriate for education at this time  Skin:  Skin Assessment: Reviewed RN Assessment  Last BM:  9/8  Height:   Ht Readings  from Last 1 Encounters:  07/03/20 5\' 8"  (1.727 m)    Weight:   Wt Readings from Last 1 Encounters:  07/03/20 76 kg    Ideal Body Weight:     BMI:  Body mass index is 25.47 kg/m.  Estimated Nutritional Needs:   Kcal:  2060-2280 kcal  Protein:  90-105 grams  Fluid:  >/= 2.2 L/day     Jarome Matin, MS, RD, LDN, CNSC Inpatient Clinical Dietitian RD pager # available in AMION  After hours/weekend pager # available in [facility]

## 2020-07-04 NOTE — Care Management Important Message (Signed)
Important Message  Patient Details IM Letter given to the Patient Name: ZYKEE AVAKIAN MRN: [id number] Date of Birth: [birth year]   Medicare Important Message Given:  Yes     Kerin Salen 07/04/2020, 1:18 PM

## 2020-07-05 ENCOUNTER — Telehealth: Payer: Self-pay | Admitting: Physician Assistant

## 2020-07-05 NOTE — Telephone Encounter (Signed)
Transition Care Management Unsuccessful Follow-up Telephone Call  Date of discharge and from where: Brian [name] on 07/04/20  Attempts:  1st Attempt  Reason for unsuccessful TCM follow-up call:  Left voice message to call office

## 2020-07-05 NOTE — Discharge Summary (Signed)
Physician Discharge Summary  Brian [name] ZOX:[id number] DOB: [birth year] DOA: 07/02/2020  PCP: Beverly Sessions  Admit date: 07/02/2020 Discharge date: 07/05/2020  Recommendations for Outpatient Follow-up:  1. Discharge to home 2. Follow up with Cardiology. Keep appointment as directed. 3. Follow up with Gastroenterology. Keep appointment as directed. 4. Follow up with PCP in 7-10 days. 5. Have chemistry drawn at PCP visit. 6. Follow up with rehab for substance abuse.   Follow-up Information    Zehr, Laban Emperor, PA-C Follow up on 08/07/2020.   Specialty: Gastroenterology Why: 3:30 Contact information: [place] [address] [phone]                Discharge Diagnoses: Principal diagnosis is #1 Chest pain   AKI (acute kidney injury) ([place])   Polysubstance abuse ([place])   Pericarditis   Depression  Discharge Condition: Fair  Disposition: Home  Diet recommendation: Heart healthy  Filed Weights   07/03/20 1458  Weight: 76 kg    History of present illness: This is a 34 year old male with past medical history of substance abuse (alcohol, meth, cocaine, IV drug), HFpEF (EF 45 to 50% 06/19/2020) anxiety and depression, nephrolithiasis, peptic ulcers who presented to the ED for evaluation of chest pain.  He was recently discharged from [facility] on 06/21/2020 for NSTEMI believed secondary to amphetamine use and had a cardiac catheterization which showed nonobstructive CAD.  Patient had reported that he used meth night prior to presentation and had recurrence of his pain. He states that he was sober for two years but started using again in late August due to depression and issues with family/friends. He states he wants to go to rehab and wants to quit. Pain is located in the anterior chest, associated with associated nausea with intermittent diaphoresis and lightheadedness.  ED Course: T 98, P106, RR 20, BP 130/85->>> 85/43-> 104/56,  SPO2 100% on room air.  Notable labs: BUN 42, creatinine 1.6 (previous 1.12), AG 18, AST 59, ALT 56, indirect bilirubin 1.1, CK 692, troponin 68, COVID-19 negative.  CXR unremarkable.  ED physician discussed with Dr. Paticia Stack, cardiology, who reviewed the patient's chart and EKG and believe the patient may have an element of pericarditis following his recent cath and recommended symptomatic treatment and treating the patient for pericarditis.  He received 1 L NS bolus, fentanyl 50 mcg x 1 and full dose aspirin.  Of note, patient had refused the second troponin and went to registration saying he was suicidal and homicidal because is not getting arond more fluids being verbally aggressive to staff and threatening to leave to go to [facility].  Additionally, nurse tech noted a white substance on the floor of the triage room.  Hospital Course: This is a 34 year old male with past medical history of substance abuse (alcohol, meth, cocaine, IV drug), HFpEF (EF 45 to 50% 06/19/2020) anxiety and depression, nephrolithiasis, peptic ulcers who presented to the ED for evaluation of chest pain.  He was recently discharged from [facility] on 06/21/2020 for NSTEMI believed secondary to amphetamine use and had a cardiac catheterization which showed nonobstructive CAD.  Patient had reported that he used meth night prior to presentation and had recurrence of his pain. He states that he was sober for two years but started using again in late August due to depression and issues with family/friends. He states he wants to go to rehab and wants to quit. Pain is located in the anterior chest, associated  with associated nausea with intermittent diaphoresis and lightheadedness.  ED Course: T 98, P106, RR 20, BP 130/85->>> 85/43-> 104/56, SPO2 100% on room air.  Notable labs: BUN 42, creatinine 1.6 (previous 1.12), AG 18, AST 59, ALT 56, indirect bilirubin 1.1, CK 692, troponin 68, COVID-19 negative.  CXR  unremarkable.  ED physician discussed with Dr. Paticia Stack, cardiology, who reviewed the patient's chart and EKG and believe the patient may have an element of pericarditis following his recent cath and recommended symptomatic treatment and treating the patient for pericarditis.  He received 1 L NS bolus, fentanyl 50 mcg x 1 and full dose aspirin.  Of note, patient had refused the second troponin and went to registration saying he was suicidal and homicidal because is not getting arond more fluids being verbally aggressive to staff and threatening to leave to go to [facility].  Additionally, nurse tech noted a white substance on the floor of the triage room.  Today's assessment: S: The patient is resting comfortably. He is complaining of chest pain, chronic belching and numbness and tingling in the toes of his right foot. O: Vitals:  Vitals:   07/04/20 0547 07/04/20 1030  BP: 100/66 123/75  Pulse: (!) 54 (!) 55  Resp: 16   Temp: 97.8 F (36.6 C) 98.3 F (36.8 C)  SpO2: 100% 99%   Exam:  Constitutional:   The patient is awake, alert, and oriented x 3. Mild distress from chest pain. Respiratory:   No increased work of breathing.  No wheezes, rales, or rhonchi  No tactile fremitus Cardiovascular:   Regular rate and rhythm  No murmurs, ectopy, or gallups.  No lateral PMI. No thrills. Abdomen:   Abdomen is soft, non-tender, non-distended  No hernias, masses, or organomegaly  Normoactive bowel sounds.  Musculoskeletal:   No cyanosis, clubbing, or edema Skin:   No rashes, lesions, ulcers  palpation of skin: no induration or nodules Neurologic:   CN 2-12 intact  Sensation all 4 extremities intact Psychiatric:   Mental status ? Mood, affect appropriate ? Orientation to person, place, time   judgment and insight appear intact Discharge Instructions  Discharge Instructions    Activity as tolerated - No restrictions   Complete by: As directed    Call MD for:  difficulty  breathing, headache or visual disturbances   Complete by: As directed    Call MD for:  severe uncontrolled pain   Complete by: As directed    Call MD for:  temperature >100.4   Complete by: As directed    Diet - low sodium heart healthy   Complete by: As directed    Discharge instructions   Complete by: As directed    Discharge to home Follow up with Cardiology. Keep appointment as directed. Follow up with Gastroenterology. Keep appointment as directed. Follow up with PCP in 7-10 days. Have chemistry drawn at PCP visit. Follow up with rehab for substance abuse.   Increase activity slowly   Complete by: As directed      Allergies as of 07/04/2020      Reactions   Fish Allergy Anaphylaxis   Shellfish Allergy Anaphylaxis      Medication List    TAKE these medications   (feeding supplement) PROSource Plus liquid Take 30 mLs by mouth 2 (two) times daily between meals.   aspirin 81 MG chewable tablet Chew 1 tablet (81 mg total) by mouth daily.   atorvastatin 80 MG tablet Commonly known as: LIPITOR Take 1 tablet (80 mg total)  by mouth daily.   buPROPion 150 MG 24 hr tablet Commonly known as: WELLBUTRIN XL Take 1 tablet (150 mg total) by mouth daily.   clopidogrel 75 MG tablet Commonly known as: PLAVIX Take 1 tablet (75 mg total) by mouth daily.   colchicine 0.6 MG tablet Take 1 tablet (0.6 mg total) by mouth 2 (two) times daily.   mirtazapine 7.5 MG tablet Commonly known as: REMERON Take 1 tablet (7.5 mg total) by mouth at bedtime.   multivitamin with minerals Tabs tablet Take 1 tablet by mouth daily.   pantoprazole 40 MG tablet Commonly known as: PROTONIX Take 1 tablet (40 mg total) by mouth 2 (two) times daily. What changed: when to take this   QUEtiapine 25 MG tablet Commonly known as: SEROQUEL Take 1 tablet (25 mg total) by mouth 3 (three) times daily.      Allergies  Allergen Reactions  . Fish Allergy Anaphylaxis  . Shellfish Allergy Anaphylaxis     The results of significant diagnostics from this hospitalization (including imaging, microbiology, ancillary and laboratory) are listed below for reference.    Significant Diagnostic Studies: CT Abdomen Pelvis Wo Contrast  Result Date: 06/19/2020 CLINICAL DATA:  Abdominal pain, flank pain, vomiting, acute renal failure EXAM: CT ABDOMEN AND PELVIS WITHOUT CONTRAST TECHNIQUE: Multidetector CT imaging of the abdomen and pelvis was performed following the standard protocol without IV contrast. COMPARISON:  05/30/2015 FINDINGS: Lower chest: No acute abnormality. Hepatobiliary: No solid liver abnormality is seen. No gallstones, gallbladder wall thickening, or biliary dilatation. Pancreas: Unremarkable. No pancreatic ductal dilatation or surrounding inflammatory changes. Spleen: Normal in size without significant abnormality. Adrenals/Urinary Tract: Adrenal glands are unremarkable. Nonobstructive calculus of the inferior pole of the right kidney. No hydronephrosis. Bladder is unremarkable. Stomach/Bowel: Stomach is within normal limits. Appendix is surgically absent. No evidence of bowel wall thickening, distention, or inflammatory changes. Vascular/Lymphatic: No significant vascular findings are present. No enlarged abdominal or pelvic lymph nodes. Reproductive: No mass or other significant abnormality. Other: No abdominal wall hernia or abnormality. No abdominopelvic ascites. Musculoskeletal: No acute or significant osseous findings. IMPRESSION: 1. No acute non-contrast CT findings of the abdomen or pelvis to explain abdominal pain or vomiting. 2. Nonobstructive right nephrolithiasis. No hydronephrosis. Electronically Signed   By: Eddie Candle M.D.   On: 06/19/2020 09:14   DG Chest 2 View  Result Date: 07/01/2020 CLINICAL DATA:  Chest pain. EXAM: CHEST - 2 VIEW COMPARISON:  June 19, 2020. FINDINGS: The heart size and mediastinal contours are within normal limits. Both lungs are clear. No pneumothorax or  pleural effusion is noted. The visualized skeletal structures are unremarkable. IMPRESSION: No active cardiopulmonary disease. Electronically Signed   By: Marijo Conception M.D.   On: 07/01/2020 14:17   DG Chest 2 View  Result Date: 06/19/2020 CLINICAL DATA:  Left-sided chest pain EXAM: CHEST - 2 VIEW COMPARISON:  04/22/2020 FINDINGS: The heart size and mediastinal contours are within normal limits. Both lungs are clear. The visualized skeletal structures are unremarkable. IMPRESSION: No active cardiopulmonary disease. Electronically Signed   By: Davina Poke D.O.   On: 06/19/2020 08:13   CARDIAC CATHETERIZATION  Result Date: 06/21/2020 Conclusions: 1. No angiographically significant atherosclerotic coronary artery disease.  Moderate transient stenosis at the ostium of the RCA resolved with intracoronary nitroglycerin and is consistent with vasospasm. 2. Sluggish flow in the distal LAD, which may reflect an element of microvascular dysfunction. 3. Mildly elevated left ventricular filling pressure. Recommendations: 1. Encourage abstinence from drugs and tobacco.  2. Dual antiplatelet therapy with aspirin and clopidogrel for medical management of MINOCA. 3. If blood pressure tolerates, low-dose diltiazem could be considered if the patient continues to have intermittent angina. Nelva Bush, MD [facility]   US RENAL  Result Date: 06/19/2020 CLINICAL DATA:  AKI EXAM: RENAL / URINARY TRACT ULTRASOUND COMPLETE COMPARISON:  None. FINDINGS: Right Kidney: Renal measurements: 10.7 x 4.2 x 5.6 cm = volume: 131.3 mL. Echogenicity within normal limits. No mass or hydronephrosis visualized. Left Kidney: Renal measurements: 9.7 x 4.8 x 4.9 cm = volume: 119.3 mL. Echogenicity within normal limits. No mass or hydronephrosis visualized. Bladder: Appears normal for degree of bladder distention. Other: None. IMPRESSION: Normal renal ultrasound. Electronically Signed   By: Macy Mis M.D.   On: 06/19/2020 13:20     ECHOCARDIOGRAM COMPLETE  Result Date: 06/19/2020    ECHOCARDIOGRAM REPORT   Patient Name:   Gregorey A Tillett Date of Exam: 06/19/2020 Medical Rec #:  [id number]          Height:       69.0 in Accession #:    [id number]         Weight:       165.0 lb Date of Birth:  [birth year]          BSA:          1.904 m Patient Age:    74 years           BP:           125/85 mmHg Patient Gender: M                  HR:           82 bpm. Exam Location:  Inpatient Procedure: 2D Echo, Cardiac Doppler and Color Doppler Indications:    Chest Pain 786.50 / R07.9  History:        Patient has prior history of Echocardiogram examinations, most                 recent 04/06/2018.  Sonographer:    Bernadene Person RDCS Referring Phys: [id number] [facility] NARENDRA  Sonographer Comments: Technically difficult due to not being able to stay still. IMPRESSIONS  1. Hypokinesis of the basal to mid inferior, anteroseptal and inferoseptal myocardium. Left ventricular ejection fraction, by estimation, is 45 to 50%. The left ventricle has mildly decreased function. The left ventricle has no regional wall motion abnormalities. There is mild concentric left ventricular hypertrophy. Left ventricular diastolic parameters are consistent with Grade I diastolic dysfunction (impaired relaxation).  2. Right ventricular systolic function is normal. The right ventricular size is normal. There is normal pulmonary artery systolic pressure.  3. The mitral valve is normal in structure. Trivial mitral valve regurgitation. No evidence of mitral stenosis.  4. The aortic valve is tricuspid. Aortic valve regurgitation is not visualized. No aortic stenosis is present.  5. The inferior vena cava is normal in size with <50% respiratory variability, suggesting right atrial pressure of 8 mmHg. FINDINGS  Left Ventricle: Hypokinesis of the basal to mid inferior, anteroseptal and inferoseptal myocardium. Left ventricular ejection fraction, by estimation, is 45 to 50%. The left  ventricle has mildly decreased function. The left ventricle has no regional wall motion abnormalities. The left ventricular internal cavity size was normal in size. There is mild concentric left ventricular hypertrophy. Left ventricular diastolic parameters are consistent with Grade I diastolic dysfunction (impaired relaxation).  Normal left ventricular filling pressure. Right Ventricle: The right ventricular size  is normal. No increase in right ventricular wall thickness. Right ventricular systolic function is normal. There is normal pulmonary artery systolic pressure. The tricuspid regurgitant velocity is 1.13 m/s, and  with an assumed right atrial pressure of 8 mmHg, the estimated right ventricular systolic pressure is 46.6 mmHg. Left Atrium: Left atrial size was normal in size. Right Atrium: Right atrial size was normal in size. Pericardium: There is no evidence of pericardial effusion. Mitral Valve: The mitral valve is normal in structure. Normal mobility of the mitral valve leaflets. Trivial mitral valve regurgitation. No evidence of mitral valve stenosis. Tricuspid Valve: The tricuspid valve is normal in structure. Tricuspid valve regurgitation is trivial. No evidence of tricuspid stenosis. Aortic Valve: The aortic valve is tricuspid. Aortic valve regurgitation is not visualized. No aortic stenosis is present. Pulmonic Valve: The pulmonic valve was normal in structure. Pulmonic valve regurgitation is not visualized. No evidence of pulmonic stenosis. Aorta: The aortic root is normal in size and structure. Venous: The inferior vena cava is normal in size with less than 50% respiratory variability, suggesting right atrial pressure of 8 mmHg. IAS/Shunts: No atrial level shunt detected by color flow Doppler.  LEFT VENTRICLE PLAX 2D LVIDd:         4.62 cm  Diastology LVIDs:         3.45 cm  LV e' lateral:   8.24 cm/s LV PW:         1.21 cm  LV E/e' lateral: 5.0 LV IVS:        1.07 cm  LV e' medial:    5.70 cm/s  LVOT diam:     2.10 cm  LV E/e' medial:  7.2 LV SV:         59 LV SV Index:   31 LVOT Area:     3.46 cm  RIGHT VENTRICLE RV S prime:     8.94 cm/s TAPSE (M-mode): 1.4 cm LEFT ATRIUM             Index       RIGHT ATRIUM           Index LA diam:        2.40 cm 1.26 cm/m  RA Area:     14.90 cm LA Vol (A2C):   37.2 ml 19.54 ml/m RA Volume:   37.00 ml  19.43 ml/m LA Vol (A4C):   30.6 ml 16.07 ml/m LA Biplane Vol: 35.1 ml 18.44 ml/m  AORTIC VALVE LVOT Vmean:  78.400 cm/s LVOT VTI:    0.171 m  AORTA Ao Root diam: 3.10 cm Ao Asc diam:  3.20 cm MITRAL VALVE               TRICUSPID VALVE MV Area (PHT): 3.85 cm    TR Peak grad:   5.1 mmHg MV Decel Time: 197 msec    TR Vmax:        113.00 cm/s MV E velocity: 41.10 cm/s MV A velocity: 57.90 cm/s  SHUNTS MV E/A ratio:  0.71        Systemic VTI:  0.17 m                            Systemic Diam: 2.10 cm Skeet Latch MD Electronically signed by Skeet Latch MD Signature Date/Time: 06/19/2020/4:28:34 PM    Final    ECHOCARDIOGRAM LIMITED  Result Date: 07/02/2020    ECHOCARDIOGRAM LIMITED REPORT   Patient Name:   Brian A  [name] Date of Exam: 07/02/2020 Medical Rec #:  [id number]          Height:       68.0 in Accession #:    [id number]         Weight:       167.5 lb Date of Birth:  06-13-1986          BSA:          1.895 m Patient Age:    65 years           BP:           100/64 mmHg Patient Gender: M                  HR:           58 bpm. Exam Location:  Inpatient Procedure: Limited Echo and Cardiac Doppler Indications:    Chest Pain 786.50 / R07.9  History:        Patient has prior history of Echocardiogram examinations, most                 recent 06/19/2020. Risk Factors:Current Smoker.  Sonographer:    Vickie Epley RDCS Referring Phys: [id number] [place]  1. Limited echo no color/doppler EF is normal previously described RWMAls not apparent Apex not well seen.  2. Left ventricular ejection fraction, by estimation, is 55 to 60%. The left ventricle  has normal function. The left ventricle has no regional wall motion abnormalities.  3. Right ventricular systolic function is normal. The right ventricular size is normal.  4. The mitral valve is normal in structure. No evidence of mitral valve regurgitation. No evidence of mitral stenosis.  5. The aortic valve is normal in structure. Aortic valve regurgitation is not visualized. No aortic stenosis is present.  6. The inferior vena cava is normal in size with greater than 50% respiratory variability, suggesting right atrial pressure of 3 mmHg. FINDINGS  Left Ventricle: Left ventricular ejection fraction, by estimation, is 55 to 60%. The left ventricle has normal function. The left ventricle has no regional wall motion abnormalities. The left ventricular internal cavity size was normal in size. There is  no left ventricular hypertrophy. Right Ventricle: The right ventricular size is normal. No increase in right ventricular wall thickness. Right ventricular systolic function is normal. Left Atrium: Left atrial size was normal in size. Right Atrium: Right atrial size was normal in size. Pericardium: There is no evidence of pericardial effusion. Mitral Valve: The mitral valve is normal in structure. Normal mobility of the mitral valve leaflets. No evidence of mitral valve stenosis. Tricuspid Valve: The tricuspid valve is normal in structure. Tricuspid valve regurgitation is not demonstrated. No evidence of tricuspid stenosis. Aortic Valve: The aortic valve is normal in structure. Aortic valve regurgitation is not visualized. No aortic stenosis is present. Pulmonic Valve: The pulmonic valve was normal in structure. Pulmonic valve regurgitation is not visualized. No evidence of pulmonic stenosis. Aorta: The aortic root is normal in size and structure. Venous: The inferior vena cava is normal in size with greater than 50% respiratory variability, suggesting right atrial pressure of 3 mmHg. IAS/Shunts: No atrial level shunt  detected by color flow Doppler. Additional Comments: Limited echo no color/doppler EF is normal previously described RWMAls not apparent Apex not well seen. LEFT VENTRICLE PLAX 2D LVIDd:         4.10 cm LVIDs:         2.55 cm LV PW:  1.00 cm LV IVS:        0.80 cm  LEFT ATRIUM         Index LA diam:    3.00 cm 1.58 cm/m Jenkins Rouge MD Electronically signed by Jenkins Rouge MD Signature Date/Time: 07/02/2020/2:54:12 PM    Final     Microbiology: Recent Results (from the past 240 hour(s))  SARS Coronavirus 2 by RT PCR (hospital order, performed in [facility] hospital lab) Nasopharyngeal Nasopharyngeal Swab     Status: None   Collection Time: 07/02/20  4:57 AM   Specimen: Nasopharyngeal Swab  Result Value Ref Range Status   SARS Coronavirus 2 NEGATIVE NEGATIVE Final    Comment: (NOTE) SARS-CoV-2 target nucleic acids are NOT DETECTED.  The SARS-CoV-2 RNA is generally detectable in upper and lower respiratory specimens during the acute phase of infection. The lowest concentration of SARS-CoV-2 viral copies this assay can detect is 250 copies / mL. A negative result does not preclude SARS-CoV-2 infection and should not be used as the sole basis for treatment or other patient management decisions.  A negative result may occur with improper specimen collection / handling, submission of specimen other than nasopharyngeal swab, presence of viral mutation(s) within the areas targeted by this assay, and inadequate number of viral copies (<250 copies / mL). A negative result must be combined with clinical observations, patient history, and epidemiological information.  Fact Sheet for Patients:   StrictlyIdeas.no  Fact Sheet for Healthcare Providers: BankingDealers.co.za  This test is not yet approved or  cleared by the [place] FDA and has been authorized for detection and/or diagnosis of SARS-CoV-2 by FDA under an Emergency Use  Authorization (EUA).  This EUA will remain in effect (meaning this test can be used) for the duration of the COVID-19 declaration under Section 564(b)(1) of the Act, 21 U.S.C. section 360bbb-3(b)(1), unless the authorization is terminated or revoked sooner.  Performed at [facility], [place] [address], [place], [place] [address]      Labs: Basic Metabolic Panel: Recent Labs  Lab 07/01/20 2210 07/02/20 1050 07/04/20 0513  NA 136  --  139  K 4.0  --  4.1  CL 101  --  106  CO2 17*  --  24  GLUCOSE 77  --  91  BUN 42*  --  18  CREATININE 1.86* 1.22 1.05  CALCIUM 9.9  --  8.7*   Liver Function Tests: Recent Labs  Lab 07/02/20 0348  AST 59*  ALT 56*  ALKPHOS 77  BILITOT 1.3*  PROT 8.5*  ALBUMIN 4.8   No results for input(s): LIPASE, AMYLASE in the last 168 hours. No results for input(s): AMMONIA in the last 168 hours. CBC: Recent Labs  Lab 07/01/20 2210 07/02/20 1050 07/04/20 0513  WBC 13.2* 14.1* 9.0  NEUTROABS  --   --  4.5  HGB 16.3 13.4 13.2  HCT 48.0 40.3 39.2  MCV 91.4 91.8 91.8  PLT 294 238 218   Cardiac Enzymes: Recent Labs  Lab 07/02/20 0348  CKTOTAL 692*   BNP: BNP (last 3 results) No results for input(s): BNP in the last 8760 hours.  ProBNP (last 3 results) No results for input(s): PROBNP in the last 8760 hours.  CBG: No results for input(s): GLUCAP in the last 168 hours.  Principal Problem:   Chest pain Active Problems:   AKI (acute kidney injury) ([place])   Polysubstance abuse ([place])   Pericarditis   Depression   Time coordinating discharge: 48  minutes.  Signed:        Raushanah Osmundson, DO [facility]  07/05/2020, 6:45 PM

## 2020-07-05 NOTE — Telephone Encounter (Signed)
TOC Patient-  Please call Patient- Pt have an appt on 07-10-20 with Richardson Dopp

## 2020-07-06 NOTE — Telephone Encounter (Signed)
Transition Care Management Unsuccessful Follow-up Telephone Call  Date of discharge and from where:  [facility] on 07/04/20  Attempts:  2nd Attempt  Reason for unsuccessful TCM follow-up call:  Left voice message to call office

## 2020-07-09 NOTE — Telephone Encounter (Signed)
**Note De-Identified  Obfuscation** Transition Care Management Unsuccessful Follow-up Telephone Call  Date of discharge and from where:  07/04/2020 [facility]  Attempts:  3rd  Reason for unsuccessful TCM follow-up call: No answer so I left a voice message asking the pt to call Jeani Hawking at [phone] at [facility]. I did advise him to ask to s/w a triage nurse if he calls back after 3pm.   I also left a reminder in the message (he did identify himself in his VM greeting) that he has an appointment scheduled tomorrow (07/10/2020) at 12:15 with Richardson Dopp, PA-c at [address], [place] in [place], [place]

## 2020-07-10 ENCOUNTER — Ambulatory Visit: Payer: Self-pay | Admitting: Physician Assistant

## 2020-07-10 NOTE — Progress Notes (Deleted)
Cardiology Office Note:    Date:  07/10/2020   ID:  Brian [name], DOB [birth year], MRN [id number]  PCP:  [facility] Cardiologist:  Sinclair Grooms, MD *** [facility] Electrophysiologist:  None   Referring MD: Beverly Sessions   Chief Complaint:  No chief complaint on file.    Patient Profile:    Brian [name] is a 34 y.o. male with:   Polysubstance abuse (methamphetamine, cocaine, heroin); prior IVDA  HCV  Cirrhosis  GERD  Bipolar d/o  Tobacco use  Hx of homelessness   Hx of MI w non-obs CAD (MINOCA) in setting of methamphetamine use 8/21  Cath 8/21 w sluggish dLAD flow (?microvascular dz); oRCA 50 (spasm; cleared w IC NTG)  Echocardiogram 8/21: EF 45-50; inf/ant-sept/inf-sept HK  Echocardiogram 9/21: EF 55-60, no RWMA   Prior CV studies: Limited echocardiogram 07/02/2020 EF 55-60, no RWMA, normal RVSF  Cardiac catheterization 06/21/2020 LM normal LAD sluggish flow distally (?  Microvascular dysfunction) RI normal LCx normal RCA ostial 50 (resolved with IC nitroglycerin c/w vasospasm) EP 20-25 1. Encourage abstinence from drugs and tobacco. 2. Dual antiplatelet therapy with aspirin and clopidogrel for medical management of MINOCA. 3. If blood pressure tolerates, low-dose diltiazem could be considered if the patient continues to have intermittent angina.  Echocardiogram 06/19/20 Inferior, anteroseptal, inferoseptal HK; EF 45-50, mild LVH, GR 1 DD, normal RVSF, trivial MR  History of Present Illness:    [name] was admitted 8/24-8/26 with a non-STEMI complicated by acute kidney injury.  His cardiac catheterization demonstrated no sig CAD. There was slow flow in the distal LAD and 50% oRCA stenosis that resolved with IC NTG.  It was thought he may have had spasm and the cause was likely substance abuse.  He was readmitted 9/6-9/9 with recurrent chest pain.  There was some concern for pericarditis and hie was placed on Colchicine.  ***        Past Medical History:  Diagnosis Date  . Alcohol abuse   . Anxiety   . Cocaine abuse ([place])   . Depression   . Kidney stones   . Narcotic abuse ([place])   . Peptic ulcer   . Polysubstance abuse ([place])   . Renal disorder    kidney stones    Current Medications: No outpatient medications have been marked as taking for the 07/10/20 encounter (Appointment) with Richardson Dopp T, PA-C.     Allergies:   Fish allergy and Shellfish allergy   Social History   Tobacco Use  . Smoking status: Current Every Day Smoker    Packs/day: 0.50    Types: Cigarettes  . Smokeless tobacco: Never Used  Vaping Use  . Vaping Use: Never used  Substance Use Topics  . Alcohol use: Not Currently  . Drug use: Not Currently    Types: Marijuana, Cocaine, IV    Comment: heroin and meth     Family Hx: The patient's family history includes Heart disease in his father.  ROS   EKGs/Labs/Other Test Reviewed:    EKG:  EKG is *** ordered today.  The ekg ordered today demonstrates ***  Recent Labs: 07/02/2020: ALT 56 07/04/2020: BUN 18; Creatinine, Ser 1.05; Hemoglobin 13.2; Platelets 218; Potassium 4.1; Sodium 139   Recent Lipid Panel Lab Results  Component Value Date/Time   CHOL 152 03/28/2018 06:04 AM   TRIG 99 03/28/2018 06:04 AM   HDL 44 03/28/2018 06:04 AM   CHOLHDL 3.5 03/28/2018 06:04 AM   LDLCALC 88 03/28/2018  06:04 AM    Physical Exam:    VS:  There were no vitals taken for this visit.    Wt Readings from Last 3 Encounters:  07/03/20 167 lb 7.7 oz (76 kg)  06/21/20 167 lb 8 oz (76 kg)  06/03/20 170 lb (77.1 kg)     Physical Exam ***  ASSESSMENT & PLAN:    ***  Dispo:  No follow-ups on file.   Medication Adjustments/Labs and Tests Ordered: Current medicines are reviewed at length with the patient today.  Concerns regarding medicines are outlined above.  Tests Ordered: No orders of the defined types were placed in this encounter.  Medication Changes: No orders of the defined  types were placed in this encounter.   Signed, Richardson Dopp, PA-C  07/10/2020 8:02 AM    [facility] [place], [place], [place]  [address] Phone: [phone]; Fax: [phone]

## 2020-07-17 ENCOUNTER — Ambulatory Visit (HOSPITAL_COMMUNITY): Payer: Self-pay | Admitting: Clinical

## 2020-07-25 ENCOUNTER — Ambulatory Visit: Payer: Self-pay | Admitting: Physician Assistant

## 2020-08-07 ENCOUNTER — Ambulatory Visit: Payer: Self-pay | Admitting: Gastroenterology

## 2020-08-09 ENCOUNTER — Encounter (HOSPITAL_COMMUNITY): Payer: Self-pay | Admitting: Psychiatry

## 2020-08-09 ENCOUNTER — Encounter (HOSPITAL_COMMUNITY): Payer: Self-pay | Admitting: Emergency Medicine

## 2020-08-09 ENCOUNTER — Emergency Department (HOSPITAL_COMMUNITY)
Admission: EM | Admit: 2020-08-09 | Discharge: 2020-08-10 | Disposition: A | Discharge status: Home / Self Care | Payer: Self-pay | Attending: Emergency Medicine | Admitting: Emergency Medicine

## 2020-08-09 ENCOUNTER — Ambulatory Visit (HOSPITAL_COMMUNITY)
Admission: EM | Admit: 2020-08-09 | Discharge: 2020-08-09 | Disposition: A | Discharge status: Home / Self Care | Payer: No Payment, Other | Attending: Psychiatry | Admitting: Psychiatry

## 2020-08-09 ENCOUNTER — Emergency Department (HOSPITAL_COMMUNITY)
Admission: EM | Admit: 2020-08-09 | Discharge: 2020-08-09 | Disposition: A | Discharge status: Home / Self Care | Payer: Self-pay | Attending: Emergency Medicine | Admitting: Emergency Medicine

## 2020-08-09 ENCOUNTER — Other Ambulatory Visit: Payer: Self-pay

## 2020-08-09 DIAGNOSIS — F1910 Other psychoactive substance abuse, uncomplicated: Secondary | ICD-10-CM | POA: Insufficient documentation

## 2020-08-09 DIAGNOSIS — Z20822 Contact with and (suspected) exposure to covid-19: Secondary | ICD-10-CM | POA: Insufficient documentation

## 2020-08-09 DIAGNOSIS — F1520 Other stimulant dependence, uncomplicated: Secondary | ICD-10-CM | POA: Diagnosis not present

## 2020-08-09 DIAGNOSIS — F17210 Nicotine dependence, cigarettes, uncomplicated: Secondary | ICD-10-CM | POA: Insufficient documentation

## 2020-08-09 DIAGNOSIS — R0981 Nasal congestion: Secondary | ICD-10-CM | POA: Diagnosis not present

## 2020-08-09 DIAGNOSIS — R45851 Suicidal ideations: Secondary | ICD-10-CM | POA: Insufficient documentation

## 2020-08-09 DIAGNOSIS — J069 Acute upper respiratory infection, unspecified: Secondary | ICD-10-CM

## 2020-08-09 DIAGNOSIS — F32A Depression, unspecified: Secondary | ICD-10-CM | POA: Insufficient documentation

## 2020-08-09 LAB — COMPREHENSIVE METABOLIC PANEL
ALT: 41 U/L (ref 0–44)
AST: 42 U/L — ABNORMAL HIGH (ref 15–41)
Albumin: 4.2 g/dL (ref 3.5–5.0)
Alkaline Phosphatase: 89 U/L (ref 38–126)
Anion gap: 11 (ref 5–15)
BUN: 17 mg/dL (ref 6–20)
CO2: 26 mmol/L (ref 22–32)
Calcium: 9.6 mg/dL (ref 8.9–10.3)
Chloride: 99 mmol/L (ref 98–111)
Creatinine, Ser: 1.37 mg/dL — ABNORMAL HIGH (ref 0.61–1.24)
GFR, Estimated: >60 mL/min (ref >60)
Glucose, Bld: 94 mg/dL (ref 70–99)
Potassium: 3.8 mmol/L (ref 3.5–5.1)
Sodium: 136 mmol/L (ref 135–145)
Total Bilirubin: 0.6 mg/dL (ref 0.3–1.2)
Total Protein: 8 g/dL (ref 6.5–8.1)

## 2020-08-09 LAB — CBC
HCT: 39.7 % (ref 39.0–52.0)
Hemoglobin: 12.9 g/dL — ABNORMAL LOW (ref 13.0–17.0)
MCH: 30.3 pg (ref 26.0–34.0)
MCHC: 32.5 g/dL (ref 30.0–36.0)
MCV: 93.2 fL (ref 80.0–100.0)
Platelets: 282 10*3/uL (ref 150–400)
RBC: 4.26 MIL/uL (ref 4.22–5.81)
RDW: 12.7 % (ref 11.5–15.5)
WBC: 10.9 10*3/uL — ABNORMAL HIGH (ref 4.0–10.5)
nRBC: 0 % (ref 0.0–0.2)

## 2020-08-09 LAB — SALICYLATE LEVEL: Salicylate Lvl: <7 mg/dL — ABNORMAL LOW (ref 7.0–30.0)

## 2020-08-09 LAB — RAPID URINE DRUG SCREEN, HOSP PERFORMED
Amphetamines: NOT DETECTED
Barbiturates: NOT DETECTED
Benzodiazepines: NOT DETECTED
Cocaine: NOT DETECTED
Opiates: NOT DETECTED
Tetrahydrocannabinol: NOT DETECTED

## 2020-08-09 LAB — RESPIRATORY PANEL BY RT PCR (FLU A&B, COVID)
Influenza A by PCR: NEGATIVE
Influenza B by PCR: NEGATIVE
SARS Coronavirus 2 by RT PCR: NEGATIVE

## 2020-08-09 LAB — ACETAMINOPHEN LEVEL: Acetaminophen (Tylenol), Serum: <10 ug/mL — ABNORMAL LOW (ref 10–30)

## 2020-08-09 LAB — ETHANOL: Alcohol, Ethyl (B): <10 mg/dL (ref <10)

## 2020-08-09 MED ORDER — LORAZEPAM 1 MG PO TABS
1.0000 mg | ORAL_TABLET | Freq: Four times a day (QID) | ORAL | Status: DC | PRN
  Administered 2020-08-10: 1 mg via ORAL
  Filled 2020-08-09: qty 1

## 2020-08-09 MED ORDER — ATORVASTATIN CALCIUM 80 MG PO TABS
80.0000 mg | ORAL_TABLET | Freq: Every day | ORAL | Status: DC
  Administered 2020-08-10: 80 mg via ORAL
  Filled 2020-08-09: qty 1

## 2020-08-09 MED ORDER — MIRTAZAPINE 15 MG PO TABS
7.5000 mg | ORAL_TABLET | Freq: Every day | ORAL | Status: DC
  Administered 2020-08-10: 7.5 mg via ORAL
  Filled 2020-08-09: qty 1

## 2020-08-09 MED ORDER — BUPROPION HCL ER (XL) 150 MG PO TB24
150.0000 mg | ORAL_TABLET | Freq: Every day | ORAL | Status: DC
  Administered 2020-08-10: 150 mg via ORAL
  Filled 2020-08-09: qty 1

## 2020-08-09 MED ORDER — QUETIAPINE FUMARATE 25 MG PO TABS
25.0000 mg | ORAL_TABLET | Freq: Three times a day (TID) | ORAL | Status: DC
  Administered 2020-08-10 (×3): 25 mg via ORAL
  Filled 2020-08-09 (×3): qty 1

## 2020-08-09 MED ORDER — AMOXICILLIN 500 MG PO CAPS: 500.0000 mg | ORAL_CAPSULE | Freq: Three times a day (TID) | ORAL | 0 refills | Status: DC

## 2020-08-09 MED ORDER — AMOXICILLIN 500 MG PO CAPS
500.0000 mg | ORAL_CAPSULE | Freq: Three times a day (TID) | ORAL | Status: DC
  Administered 2020-08-10 (×2): 500 mg via ORAL
  Filled 2020-08-09 (×2): qty 1

## 2020-08-09 NOTE — ED Provider Notes (Signed)
[facility] EMERGENCY DEPARTMENT Provider Note   CSN: [id number] Arrival date & time: 08/09/20  2000     History Chief Complaint  Patient presents with  . Suicidal    Brian [name] is a 34 y.o. male.  The history is provided by the patient.  Mental Health Problem Presenting symptoms: suicidal thoughts   Degree of incapacity (severity):  Moderate Onset quality:  Gradual Timing:  Constant Progression:  Worsening Chronicity:  New Relieved by:  Nothing Worsened by:  Nothing Associated symptoms: no abdominal pain and no chest pain   Patient w/previous history of polysubstance use, depression presents with suicidal ideation.  He reports has been having thoughts of harm himself all day.  However he has no specific plan & has not attempted yet.  Denies any fevers or vomiting.  Denies any pain complaints     Past Medical History:  Diagnosis Date  . Alcohol abuse   . Anxiety   . Cocaine abuse ([place])   . Depression   . Kidney stones   . Narcotic abuse ([place])   . Peptic ulcer   . Polysubstance abuse ([place])   . Renal disorder    kidney stones    Patient Active Problem List   Diagnosis Date Noted  . Pericarditis 07/02/2020  . Depression 07/02/2020  . Elevated troponin   . Non-ST elevation (NSTEMI) myocardial infarction ([place])   . Chest pain 06/19/2020  . Psychoactive substance-induced psychosis ([place]) 06/04/2020  . Malingering 06/04/2020  . Paranoia ([place])   . Amphetamine use disorder, severe, dependence ([place]) 03/26/2018  . Substance induced mood disorder ([place]) 03/26/2018  . Bipolar II disorder, severe, depressed, with anxious distress ([place]) 03/25/2018  . Cellulitis 05/20/2015  . Dysuria 05/20/2015  . Smoker 05/20/2015  . Homeless single person 05/20/2015  . Drug abuse ([place])   . Opioid use disorder, severe, dependence ([place]) 03/08/2015  . Ileus of unspecified type ([place]) 05/28/2014  . Abdominal pain 05/28/2014  . AKI (acute kidney injury) ([place])  05/28/2014  . Esophagitis, acute 05/28/2014  . Polysubstance abuse ([place]) 05/28/2014  . Hematemesis 05/28/2014    Past Surgical History:  Procedure Laterality Date  . ESOPHAGOGASTRODUODENOSCOPY N/A 05/29/2014   Procedure: ESOPHAGOGASTRODUODENOSCOPY (EGD) ;  Surgeon: Beryle Beams, MD;  [facility] END[facility]COPY;  Service: Endoscopy;  Laterality: N/A;  check with Dr. Collene Mares about sedation type/timinng - I recommend MAC  . HAND SURGERY  2006  . LEFT HEART CATH AND CORONARY ANGIOGRAPHY N/A 06/21/2020   Procedure: LEFT HEART CATH AND CORONARY ANGIOGRAPHY;  Surgeon: Nelva Bush, MD;  Location: [facility] CV LAB;  Service: Cardiovascular;  Laterality: N/A;       Family History  Problem Relation Age of Onset  . Heart disease Father     Social History   Tobacco Use  . Smoking status: Current Every Day Smoker    Packs/day: 0.50    Types: Cigarettes  . Smokeless tobacco: Never Used  Vaping Use  . Vaping Use: Never used  Substance Use Topics  . Alcohol use: Not Currently  . Drug use: Not Currently    Types: Marijuana, Cocaine, IV    Comment: heroin and meth    Home Medications Prior to Admission medications   Medication Sig Start Date End Date Taking? Authorizing Provider  amoxicillin (AMOXIL) 500 MG capsule Take 1 capsule (500 mg total) by mouth 3 (three) times daily. 08/09/20   Veryl Speak, MD  atorvastatin (LIPITOR) 80 MG tablet Take 1 tablet (80 mg total) by  mouth daily. 06/22/20 07/22/20  Sanjuan Dame, MD  buPROPion (WELLBUTRIN XL) 150 MG 24 hr tablet Take 1 tablet (150 mg total) by mouth daily. 06/05/20   Rankin, Shuvon B, NP  colchicine 0.6 MG tablet Take 1 tablet (0.6 mg total) by mouth 2 (two) times daily. 07/04/20 10/02/20  Swayze, Ava, DO  mirtazapine (REMERON) 7.5 MG tablet Take 1 tablet (7.5 mg total) by mouth at bedtime. 06/04/20   Rankin, Shuvon B, NP  Multiple Vitamin (MULTIVITAMIN WITH MINERALS) TABS tablet Take 1 tablet by mouth daily. 07/04/20   Swayze, Ava, DO    Nutritional Supplements (,FEEDING SUPPLEMENT, PROSOURCE PLUS) liquid Take 30 mLs by mouth 2 (two) times daily between meals. 07/04/20   Swayze, Ava, DO  pantoprazole (PROTONIX) 40 MG tablet Take 1 tablet (40 mg total) by mouth 2 (two) times daily. 07/04/20 08/03/20  Swayze, Ava, DO  QUEtiapine (SEROQUEL) 25 MG tablet Take 1 tablet (25 mg total) by mouth 3 (three) times daily. 06/04/20   Rankin, Shuvon B, NP  famotidine (PEPCID) 20 MG tablet Take 1 tablet (20 mg total) by mouth 2 (two) times daily. Patient not taking: Reported on 11/04/2019 08/30/19 05/04/20  Joy, Helane Gunther, PA-C  FLUoxetine (PROZAC) 20 MG capsule Take 1 capsule (20 mg total) by mouth daily. For depression Patient not taking: Reported on 11/04/2019 04/01/18 05/04/20  Lindell Spar I, NP  gabapentin (NEURONTIN) 300 MG capsule Take 2 capsules (600 mg total) by mouth 3 (three) times daily. For agitation Patient not taking: Reported on 05/02/2020 03/31/18 05/04/20  Lindell Spar I, NP  sucralfate (CARAFATE) 1 g tablet Take 1 tablet (1 g total) by mouth 4 (four) times daily -  with meals and at bedtime. Patient not taking: Reported on 11/04/2019 08/30/19 05/04/20  Joy, Helane Gunther, PA-C  traZODone (DESYREL) 50 MG tablet Take 1 tablet (50 mg total) by mouth at bedtime as needed for sleep. Patient not taking: Reported on 08/30/2019 03/31/18 08/30/19  Lindell Spar I, NP    Allergies    Fish allergy and Shellfish allergy  Review of Systems   Review of Systems  Cardiovascular: Negative for chest pain.  Gastrointestinal: Negative for abdominal pain.  Psychiatric/Behavioral: Positive for suicidal ideas.  All other systems reviewed and are negative.   Physical Exam Updated Vital Signs BP (!) 150/104 (BP Location: Right Arm)   Pulse (!) 106   Temp 98.2 F (36.8 C) (Oral)   Resp 16   SpO2 98%   Physical Exam CONSTITUTIONAL: Disheveled HEAD: Normocephalic/atraumatic EYES: EOMI ENMT: Mucous membranes moist NECK: supple no meningeal signs CV: S1/S2 noted, no  murmurs/rubs/gallops noted LUNGS: Lungs are clear to auscultation bilaterally, no apparent distress ABDOMEN: soft NEURO: Pt is awake/alert/appropriate, moves all extremitiesx4.  No facial droop.   EXTREMITIES: full ROM SKIN: warm, color normal PSYCH: Mildly anxious  ED Results / Procedures / Treatments   Labs (all labs ordered are listed, but only abnormal results are displayed) Labs Reviewed  COMPREHENSIVE METABOLIC PANEL - Abnormal; Notable for the following components:      Result Value   Creatinine, Ser 1.37 (*)    AST 42 (*)    All other components within normal limits  SALICYLATE LEVEL - Abnormal; Notable for the following components:   Salicylate Lvl <2.9 (*)    All other components within normal limits  ACETAMINOPHEN LEVEL - Abnormal; Notable for the following components:   Acetaminophen (Tylenol), Serum <10 (*)    All other components within normal limits  CBC - Abnormal;  Notable for the following components:   WBC 10.9 (*)    Hemoglobin 12.9 (*)    All other components within normal limits  ETHANOL  RAPID URINE DRUG SCREEN, HOSP PERFORMED    EKG None  Radiology No results found.  Procedures Procedures  Medications Ordered in ED Medications  amoxicillin (AMOXIL) capsule 500 mg (has no administration in time range)  atorvastatin (LIPITOR) tablet 80 mg (has no administration in time range)  buPROPion (WELLBUTRIN XL) 24 hr tablet 150 mg (has no administration in time range)  mirtazapine (REMERON) tablet 7.5 mg (has no administration in time range)  QUEtiapine (SEROQUEL) tablet 25 mg (has no administration in time range)  LORazepam (ATIVAN) tablet 1 mg (has no administration in time range)    ED Course  I have reviewed the triage vital signs and the nursing notes.  Pertinent labs  results that were available during my care of the patient were reviewed by me and considered in my medical decision making (see chart for details).    MDM Rules/Calculators/A&P                           Patient medically stable at this time.  Home medications have been ordered  The patient has been placed in psychiatric observation due to the need to provide a safe environment for the patient while obtaining psychiatric consultation and evaluation, as well as ongoing medical and medication management to treat the patient's condition.  The patient has not been placed under full IVC at this time.  Final Clinical Impression(s) / ED Diagnoses Final diagnoses:  Suicidal ideation    Rx / DC Orders ED Discharge Orders    None       Ripley Fraise, MD 08/09/20 2354

## 2020-08-09 NOTE — ED Triage Notes (Signed)
Pt here for SI states he is been thinking on hurting himself all day. Pt was seen early on ED for nasal congestion.

## 2020-08-09 NOTE — ED Triage Notes (Addendum)
Pt states he just got released from a rehab facility last night, he has been clean from drugs since sept 5th. He is here today due to nasal congestion for 5 days and loss of taste and smell 2 days ago.

## 2020-08-09 NOTE — Discharge Instructions (Signed)
Drink plenty of fluids and get plenty of rest.  Take over-the-counter medications as needed for relief of symptoms.  Fill the prescription for amoxicillin you have been given today if your symptoms are not improving in the next 3 to 4 days.

## 2020-08-09 NOTE — ED Provider Notes (Signed)
Behavioral Health Urgent Care Medical Screening Exam  Patient Name: Brian [name] MRN: [id number] Date of Evaluation: 08/09/20 Chief Complaint:   Diagnosis:  Final diagnoses:  Methamphetamine use disorder, moderate, dependence ([place])    History of Present illness: Brian [name] is a 34 y.o. male with history of polysubstance abuse. Pt presents to the [facility] stating he was released from [facility] on yesterday and would like to get an appointment for medications.  During interview patient endorses long history of polysubstance abuse with drug of choice being "meth". Patient states he relapsed 11/2019 and used "everyday until 07/21". Per pt he has had multiple hospitalizations this year including [place], [place], and a medical admission 06/22/2020 for cardiac stent placement due to "extensive meth use". Patient states he slept outside last night and doesn't have any medications. After further questions patient states [facility] sent him home with a 7 day script. Provider confirmed walk-in times and provided patient with information for walk-in services. Patient stated he was currently taking Subutex for sobriety and requested prescription. Provider spent extensive time explaining role at [facility] and inability to prescribe medication in this facility.   Per EDP note from 08/09/2020: "Patient is a 34 year old male with history of polysubstance abuse. He presents with a 5-day history of nasal congestion. He tells me he was just recently released from a drug treatment facility yesterday. He had a negative Covid test while he was there. He denies to me he is having any chest pain or difficulty breathing.   The history is provided by the patient"  Psychiatric Specialty Exam  Presentation  General Appearance:Appropriate for Environment  Eye Contact:Good  Speech:Clear and Coherent;Normal Rate  Speech Volume:Normal  Handedness:Right   Mood and Affect   Mood:Euthymic  Affect:Appropriate   Thought Process  Thought Processes:Coherent  Descriptions of Associations:Intact  Orientation:Full (Time, Place and Person)  Thought Content:Logical  Hallucinations:None  Ideas of Reference:None  Suicidal Thoughts:No With Plan  Homicidal Thoughts:No Without Plan   Sensorium  Memory:Immediate Good;Recent Good;Remote Good  Judgment:Fair  Insight:Fair   Executive Functions  Concentration:Fair  Attention Span:Fair  [place]   Psychomotor Activity  Psychomotor Activity:Normal   Assets  Assets:Communication Skills;Desire for Improvement   Sleep  Sleep:Fair  Number of hours: No data recorded  Physical Exam: Physical Exam Vitals and nursing note reviewed.  Psychiatric:        Mood and Affect: Mood normal.        Behavior: Behavior normal.        Thought Content: Thought content normal.        Judgment: Judgment normal.    Review of Systems  All other systems reviewed and are negative.  Blood pressure (!) 146/99, pulse (!) 115, temperature 98.5 F (36.9 C), temperature source Oral, resp. rate 20, height 5\' 6"  (1.676 m), weight 74.8 kg, SpO2 99 %. Body mass index is 26.63 kg/m.  Musculoskeletal: Strength & Muscle Tone: within normal limits Gait & Station: normal Patient leans: N/A   [place] MSE Discharge Disposition for Follow up and Recommendations: Based on my evaluation the patient does not appear to have an emergency medical condition and can be discharged with resources and follow up care in outpatient services for Substance Abuse Intensive Outpatient Program and Individual Therapy  Based on my evaluation the patient does not appear to be a danger to himself or others; there are signs of active psychosis or responding to any external/internal stimuli. Patient given printout of resources  for outpatient services and directed to walk-in clinic upstairs for a walk-in  appointment.    Inda Merlin, NP 08/09/2020, 12:36 PM

## 2020-08-09 NOTE — Discharge Instructions (Addendum)
Take all of your medications as prescribed by your mental healthcare provider.  Report any adverse effects and reactions from your medications to your outpatient provider promptly.  Do not engage in alcohol and or illegal drug use while on prescription medicines. Keep all scheduled appointments. This is to ensure that you are getting refills on time and to avoid any interruption in your medication.  If you are unable to keep an appointment call to reschedule.  Be sure to follow up with resources and follow ups given. In the event of worsening symptoms call the crisis hotline, 911, and or go to the nearest emergency department for appropriate evaluation and treatment of symptoms. Follow-up with your primary care provider for your medical issues, concerns and or health care needs.    Substance abuse resources and Residential Options:  [facility] day residential substance abuse facility (not an option if you have active assault charges). [address] [place], [place] [address] Phone: [phone]: Ask for Myrlene Broker in admissions to complete intake if interested in pursuing this option.  [facility]: Can get intake scheduled; (not an option if you have active assault charges). [address] [place], [place] [phone]) Call Mon-Fri.  [facility] ([facility]): (offers outpatient therapy and intensive outpatient substance abuse therapy).  [address] [place], [place] [address] Phone: [phone]  [facility]: Offers FREE recovery skills classes, support groups, 1:1 Peer Support, and Compeer Classes. [address] [place], [place] [address] Phone: [phone] (Call to complete intake).   [facility] [place],  [address] Phone: [phone] ext [phone]  The [facility] provides food, shelter and other programs and services to the homeless men of [place] through our [employer]  program.  By offering safe shelter, three meals a day, clean clothing, Biblical counseling, financial planning, vocational training, GED/education and employment assistance, we've helped mend the shattered lives of many homeless men since opening in 1974.  We have approximately 267 beds available, with a max of 312 beds including mats for emergency situations and currently house an average of 270 men a night.  Prospective Client Check-In Information Photo ID Required (State/ Out of State/ [facility]) - if photo ID is not available, clients are required to have a printout of a police/sheriff's criminal history report. Help out with chores around the [place]. No sex offender of any type (pending, charged, registered and/or any other sex related offenses) will be permitted to check in. Must be willing to abide by all rules, regulations, and policies established by the [employer]. The following will be provided - shelter, food, clothing, and biblical counseling. If you or someone you know is in need of assistance at our [facility] shelter in [place], [place], please call [phone] ext. [phone].

## 2020-08-09 NOTE — ED Provider Notes (Signed)
[facility] EMERGENCY DEPARTMENT Provider Note   CSN: [id number] Arrival date & time: 08/09/20  4098     History Chief Complaint  Patient presents with   Nasal Congestion    Brian [name] is a 34 y.o. male.  Patient is a 34 year old male with history of polysubstance abuse.  He presents with a 5-day history of nasal congestion.  He tells me he was just recently released from a drug treatment facility yesterday.  He had a negative Covid test while he was there.  He denies to me he is having any chest pain or difficulty breathing.  The history is provided by the patient.       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   Malingering 06/04/2020   Paranoia ([place])    Amphetamine use disorder, severe, 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   Substance induced mood disorder ([place]) 03/26/2018   Bipolar II disorder, severe, depressed, with anxious distress ([place]) 03/25/2018   Cellulitis 05/20/2015   Dysuria 05/20/2015   Smoker 05/20/2015   Homeless single person 05/20/2015   Drug abuse ([place])    Opioid use disorder, severe, dependence ([place]) 03/08/2015   Ileus of unspecified type ([place]) 05/28/2014   Abdominal pain 05/28/2014   AKI (acute kidney injury) ([place]) 05/28/2014   Esophagitis, acute 05/28/2014   Polysubstance abuse ([place]) 05/28/2014   Hematemesis 05/28/2014    Past Surgical History:  Procedure Laterality Date   ESOPHAGOGASTRODUODENOSCOPY N/A 05/29/2014   Procedure:  ESOPHAGOGASTRODUODENOSCOPY (EGD) ;  Surgeon: Beryle Beams, MD;  Location: [facility] ENDOSCOPY;  Service: Endoscopy;  Laterality: N/A;  check with Dr. Collene Mares about sedation type/timinng - I recommend MAC   HAND SURGERY  2006   LEFT HEART CATH AND CORONARY ANGIOGRAPHY N/A 06/21/2020   Procedure: LEFT HEART CATH AND CORONARY ANGIOGRAPHY;  Surgeon: Nelva Bush, MD;  Location: [facility] CV LAB;  Service: Cardiovascular;  Laterality: N/A;       Family History  Problem Relation Age of Onset   Heart disease Father     Social History   Tobacco Use   Smoking status: Current Every Day Smoker    Packs/day: 0.50    Types: Cigarettes   Smokeless tobacco: Never Used  Vaping Use   Vaping Use: Never used  Substance Use Topics   Alcohol use: Not Currently   Drug use: Not Currently    Types: Marijuana, Cocaine, IV    Comment: heroin and meth    Home Medications Prior to Admission medications   Medication Sig Start Date End Date Taking? Authorizing Provider  atorvastatin (LIPITOR) 80 MG tablet Take 1 tablet (80 mg total) by mouth daily. 06/22/20 07/22/20  Sanjuan Dame, MD  buPROPion (WELLBUTRIN XL) 150 MG 24 hr tablet Take 1 tablet (150 mg total) by mouth daily. 06/05/20   Rankin, Shuvon B, NP  colchicine 0.6 MG tablet Take 1 tablet (0.6 mg total) by mouth 2 (two) times daily. 07/04/20 10/02/20  Swayze,  Ava, DO  mirtazapine (REMERON) 7.5 MG tablet Take 1 tablet (7.5 mg total) by mouth at bedtime. 06/04/20   Rankin, Shuvon B, NP  Multiple Vitamin (MULTIVITAMIN WITH MINERALS) TABS tablet Take 1 tablet by mouth daily. 07/04/20   Swayze, Ava, DO  Nutritional Supplements (,FEEDING SUPPLEMENT, PROSOURCE PLUS) liquid Take 30 mLs by mouth 2 (two) times daily between meals. 07/04/20   Swayze, Ava, DO  pantoprazole (PROTONIX) 40 MG tablet Take 1 tablet (40 mg total) by mouth 2 (two) times daily. 07/04/20 08/03/20  Swayze, Ava, DO  QUEtiapine (SEROQUEL) 25 MG tablet Take 1 tablet (25 mg total) by mouth 3  (three) times daily. 06/04/20   Rankin, Shuvon B, NP  famotidine (PEPCID) 20 MG tablet Take 1 tablet (20 mg total) by mouth 2 (two) times daily. Patient not taking: Reported on 11/04/2019 08/30/19 05/04/20  Joy, Helane Gunther, PA-C  FLUoxetine (PROZAC) 20 MG capsule Take 1 capsule (20 mg total) by mouth daily. For depression Patient not taking: Reported on 11/04/2019 04/01/18 05/04/20  Lindell Spar I, NP  gabapentin (NEURONTIN) 300 MG capsule Take 2 capsules (600 mg total) by mouth 3 (three) times daily. For agitation Patient not taking: Reported on 05/02/2020 03/31/18 05/04/20  Lindell Spar I, NP  sucralfate (CARAFATE) 1 g tablet Take 1 tablet (1 g total) by mouth 4 (four) times daily -  with meals and at bedtime. Patient not taking: Reported on 11/04/2019 08/30/19 05/04/20  Joy, Helane Gunther, PA-C  traZODone (DESYREL) 50 MG tablet Take 1 tablet (50 mg total) by mouth at bedtime as needed for sleep. Patient not taking: Reported on 08/30/2019 03/31/18 08/30/19  Lindell Spar I, NP    Allergies    Fish allergy and Shellfish allergy  Review of Systems   Review of Systems  All other systems reviewed and are negative.   Physical Exam Updated Vital Signs BP (!) 133/95 (BP Location: Right Arm)    Pulse 73    Temp 99 F (37.2 C) (Oral)    Resp 14    Ht _0  (1.727 m)    Wt 78 kg    SpO2 98%    BMI 26.15 kg/m   Physical Exam Vitals and nursing note reviewed.  Constitutional:      General: He is not in acute distress.    Appearance: He is well-developed. He is not diaphoretic.  HENT:     Head: Normocephalic and atraumatic.  Cardiovascular:     Rate and Rhythm: Normal rate and regular rhythm.     Heart sounds: No murmur heard.  No friction rub.  Pulmonary:     Effort: Pulmonary effort is normal. No respiratory distress.     Breath sounds: Normal breath sounds. No wheezing or rales.  Abdominal:     General: Bowel sounds are normal. There is no distension.     Palpations: Abdomen is soft.     Tenderness: There is no  abdominal tenderness.  Musculoskeletal:        General: Normal range of motion.     Cervical back: Normal range of motion and neck supple.  Skin:    General: Skin is warm and dry.  Neurological:     Mental Status: He is alert and oriented to person, place, and time.     Coordination: Coordination normal.     ED Results / Procedures / Treatments   Labs (all labs ordered are listed, but only abnormal results are displayed) Labs Reviewed  RESPIRATORY PANEL BY RT PCR (FLU  A&B, COVID)    EKG None  Radiology No results found.  Procedures Procedures (including critical care time)  Medications Ordered in ED Medications - No data to display  ED Course  I have reviewed the triage vital signs and the nursing notes.  Pertinent labs & imaging results that were available during my care of the patient were reviewed by me and considered in my medical decision making (see chart for details).    MDM Rules/Calculators/A&P  Patient with history of polysubstance abuse presenting with URI symptoms for the past 5 days.  I suspect these are viral in nature.  His Covid test is negative.  I have advised patient to use over-the-counter medications, rest, drink plenty of fluids.  If his symptoms or not improving in the next 3 to 4 days, I will prescribe amoxicillin for presumed sinusitis.  He is to return as needed for any problems.  Final Clinical Impression(s) / ED Diagnoses Final diagnoses:  None    Rx / DC Orders ED Discharge Orders    None       Veryl Speak, MD 08/09/20 [phone]

## 2020-08-09 NOTE — ED Notes (Signed)
Pt discharged in no acute distress. All belongings returned to pt intact. Verbalized understanding of all discharge instructions explained on AVS by writer. Pt escorted to lobby with bus pass. Safety maintained.
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
## 2020-08-10 DIAGNOSIS — R45851 Suicidal ideations: Secondary | ICD-10-CM

## 2020-08-10 MED ORDER — ASPIRIN 81 MG PO CHEW
81.0000 mg | CHEWABLE_TABLET | Freq: Every day | ORAL | Status: DC
  Administered 2020-08-10: 81 mg via ORAL
  Filled 2020-08-10: qty 1

## 2020-08-10 MED ORDER — BUPROPION HCL ER (XL) 150 MG PO TB24
300.0000 mg | ORAL_TABLET | Freq: Every day | ORAL | Status: DC
  Administered 2020-08-10: 300 mg via ORAL
  Filled 2020-08-10: qty 2

## 2020-08-10 MED ORDER — PANTOPRAZOLE SODIUM 40 MG PO TBEC
40.0000 mg | DELAYED_RELEASE_TABLET | Freq: Two times a day (BID) | ORAL | Status: DC
  Administered 2020-08-10: 40 mg via ORAL
  Filled 2020-08-10: qty 1

## 2020-08-10 MED ORDER — BUPRENORPHINE HCL-NALOXONE HCL 2-0.5 MG SL SUBL
2.0000 | SUBLINGUAL_TABLET | Freq: Every day | SUBLINGUAL | Status: DC
  Administered 2020-08-10: 2 via SUBLINGUAL
  Filled 2020-08-10: qty 2

## 2020-08-10 MED ORDER — OLANZAPINE 5 MG PO TABS: 10.0000 mg | ORAL_TABLET | Freq: Every day | ORAL | Status: DC

## 2020-08-10 NOTE — ED Notes (Signed)
Patient denies pain and is resting comfortably.  

## 2020-08-10 NOTE — Consult Note (Addendum)
Telepsych Consultation   Reason for Consult:  Suicidal thoughts  Referring Physician:  EDP Location of Patient: [facility] ED  Location of Provider: [facility]  Patient Identification: Brian [name] MRN:  [id number] Principal Diagnosis: <principal problem not specified> Diagnosis:  Active Problems:   * No active hospital problems. *   Total Time spent with patient: 20 minutes  Subjective:  Brian [name] is a 34 y.o. male patient admitted with suicidal thoughts. Marland Kitchen   HPI: Brian [name] is a 34 y.o. male. Who presented to [facility] voicing suicidal thoughts. Patient is well know to the behavioral health system. He has had multiple psychiatric evaluations.Marland Kitchen His history is significant for polysubstance abuse. Per review of chart, patient presented to the [facility] yesterday with chief complaint of suicidal ideation without plan, struggling with mental health issues and homeless. He was psychiatrically evaluated and it was determined that he did not met criteria for an inpatient psychiatric admission. He was advised to follow up to [facility] for outpatient services.  During this evaluation, patients presentation is similar. He endorses suicidal thoughts without plan or intent, homelessness, and substance abuse. Reported he has been using meth (last use June 27, 2020), and stated," I have lost everything."  As reported yesterday, he stated that he recently completed treatment at [facility] for 10 days but stated," it did not help." Added, he was admitted to [facility] from September 8th-19 then admitted to another facility following that. Stated," I have tried to get help from many places for my drug [place] but I have been denied." Reported he has also been to several other drug rehab placements including [facility], [facility] (2-3 times), [facility], [facility] (x2) and [facility]. Per review of chart, patient also reports history of SACOT/SAIOP.  Patient  denied HI and psychosis.  He reported a history of suicide attempts with last attempt at the age of 27. He denied isostery of NSSIB. Denied access to guns. Reported he is open to inpatient rehab when we discussed how [facility] could be of assistance.  Reported he has an appointment today at 1:00 pm at the [facility] for outpatient services.   Past Psychiatric History: Bipolar, polysubstance abuse   Risk to Self:  No Risk to Others:  No Prior Inpatient Therapy:  Yes  Prior Outpatient Therapy:  See above.   Past Medical History:  Past Medical History:  Diagnosis Date  . Alcohol abuse   . Anxiety   . Cocaine abuse ([place])   . Depression   . Kidney stones   . Narcotic abuse ([place])   . Peptic ulcer   . Polysubstance abuse ([place])   . Renal disorder    kidney stones    Past Surgical History:  Procedure Laterality Date  . ESOPHAGOGASTRODUODENOSCOPY N/A 05/29/2014   Procedure: ESOPHAGOGASTRODUODENOSCOPY (EGD) ;  Surgeon: Beryle Beams, MD;  Location: [facility] ENDOSCOPY;  Service: Endoscopy;  Laterality: N/A;  check with Dr. Collene Mares about sedation type/timinng - I recommend MAC  . HAND SURGERY  2006  . LEFT HEART CATH AND CORONARY ANGIOGRAPHY N/A 06/21/2020   Procedure: LEFT HEART CATH AND CORONARY ANGIOGRAPHY;  Surgeon: Nelva Bush, MD;  Location: [facility];  Service: Cardiovascular;  Laterality: N/A;   Family History:  Family History  Problem Relation Age of Onset  . Heart disease Father    Family Psychiatric  History: None documented  Social History:  Social History   Substance and Sexual Activity  Alcohol Use Not Currently  Social History   Substance and Sexual Activity  Drug Use Not Currently  . Types: Marijuana, Cocaine, IV   Comment: heroin and meth    Social History   Socioeconomic History  . Marital status: Single    Spouse name: Not on file  . Number of children: Not on file  . Years of education: Not on file  . Highest education level: Not on file  Occupational  History  . Not on file  Tobacco Use  . Smoking status: Current Every Day Smoker    Packs/day: 0.50    Types: Cigarettes  . Smokeless tobacco: Never Used  Vaping Use  . Vaping Use: Never used  Substance and Sexual Activity  . Alcohol use: Not Currently  . Drug use: Not Currently    Types: Marijuana, Cocaine, IV    Comment: heroin and meth  . Sexual activity: Not on file  Other Topics Concern  . Not on file  Social History Narrative   Lives with a friend   Lost house and custody of son 9broke up with fiancee)   Nature conservation officer   Social Determinants of Health   Financial Resource Strain:   . Difficulty of Paying Living Expenses: Not on file  Food Insecurity:   . Worried About Charity fundraiser in the Last Year: Not on file  . Ran Out of Food in the Last Year: Not on file  Transportation Needs:   . Lack of Transportation (Medical): Not on file  . Lack of Transportation (Non-Medical): Not on file  Physical Activity:   . Days of Exercise per Week: Not on file  . Minutes of Exercise per Session: Not on file  Stress:   . Feeling of Stress : Not on file  Social Connections:   . Frequency of Communication with Friends and Family: Not on file  . Frequency of Social Gatherings with Friends and Family: Not on file  . Attends Religious Services: Not on file  . Active Member of Clubs or Organizations: Not on file  . Attends Archivist Meetings: Not on file  . Marital Status: Not on file   Additional Social History:    Allergies:   Allergies  Allergen Reactions  . Fish Allergy Anaphylaxis  . Shellfish Allergy Anaphylaxis    Labs:  Results for orders placed or performed during the hospital encounter of 08/09/20 (from the past 48 hour(s))  Rapid urine drug screen (hospital performed)     Status: None   Collection Time: 08/09/20  8:00 PM  Result Value Ref Range   Opiates NONE DETECTED NONE DETECTED   Cocaine NONE DETECTED NONE DETECTED   Benzodiazepines NONE  DETECTED NONE DETECTED   Amphetamines NONE DETECTED NONE DETECTED   Tetrahydrocannabinol NONE DETECTED NONE DETECTED   Barbiturates NONE DETECTED NONE DETECTED    Comment: (NOTE) DRUG SCREEN FOR MEDICAL PURPOSES ONLY.  IF CONFIRMATION IS NEEDED FOR ANY PURPOSE, NOTIFY LAB WITHIN 5 DAYS.  LOWEST DETECTABLE LIMITS FOR URINE DRUG SCREEN Drug Class                     Cutoff (ng/mL) Amphetamine and metabolites    1000 Barbiturate and metabolites    200 Benzodiazepine                 161 Tricyclics and metabolites     300 Opiates and metabolites        300 Cocaine and metabolites        300  THC                            50 Performed at [facility], [place] [address] [place], [place] [address]   Comprehensive metabolic panel     Status: Abnormal   Collection Time: 08/09/20  8:21 PM  Result Value Ref Range   Sodium 136 135 - 145 mmol/L   Potassium 3.8 3.5 - 5.1 mmol/L   Chloride 99 98 - 111 mmol/L   CO2 26 22 - 32 mmol/L   Glucose, Bld 94 70 - 99 mg/dL    Comment: Glucose reference range applies only to samples taken after fasting for at least 8 hours.   BUN 17 6 - 20 mg/dL   Creatinine, Ser 1.37 (H) 0.61 - 1.24 mg/dL   Calcium 9.6 8.9 - 10.3 mg/dL   Total Protein 8.0 6.5 - 8.1 g/dL   Albumin 4.2 3.5 - 5.0 g/dL   AST 42 (H) 15 - 41 U/L   ALT 41 0 - 44 U/L   Alkaline Phosphatase 89 38 - 126 U/L   Total Bilirubin 0.6 0.3 - 1.2 mg/dL   GFR, Estimated >60 >60 mL/min   Anion gap 11 5 - 15    Comment: Performed at [facility] [address], [place], [place] [address]  Ethanol     Status: None   Collection Time: 08/09/20  8:21 PM  Result Value Ref Range   Alcohol, Ethyl (B) <10 <10 mg/dL    Comment: (NOTE) Lowest detectable limit for serum alcohol is 10 mg/dL.  For medical purposes only. Performed at [facility], [place] [address] [place], [place] [address]   Salicylate level     Status: Abnormal   Collection Time: 08/09/20  8:21 PM  Result Value Ref  Range   Salicylate Lvl <1.6 (L) 7.0 - 30.0 mg/dL    Comment: Performed at [facility] [address] [place], [place] [address]  Acetaminophen level     Status: Abnormal   Collection Time: 08/09/20  8:21 PM  Result Value Ref Range   Acetaminophen (Tylenol), Serum <10 (L) 10 - 30 ug/mL    Comment: (NOTE) Therapeutic concentrations vary significantly. A range of 10-30 ug/mL  may be an effective concentration for many patients. However, some  are best treated at concentrations outside of this range. Acetaminophen concentrations >150 ug/mL at 4 hours after ingestion  and >50 ug/mL at 12 hours after ingestion are often associated with  toxic reactions.  Performed at [facility], [place] [address], [place], [place] [address]   cbc     Status: Abnormal   Collection Time: 08/09/20  8:21 PM  Result Value Ref Range   WBC 10.9 (H) 4.0 - 10.5 K/uL   RBC 4.26 4.22 - 5.81 MIL/uL   Hemoglobin 12.9 (L) 13.0 - 17.0 g/dL   HCT 39.7 39 - 52 %   MCV 93.2 80.0 - 100.0 fL   MCH 30.3 26.0 - 34.0 pg   MCHC 32.5 30.0 - 36.0 g/dL   RDW 12.7 11.5 - 15.5 %   Platelets 282 150 - 400 K/uL   nRBC 0.0 0.0 - 0.2 %    Comment: Performed at [facility], [place] [address], [place], [place] [address]    Medications:  Current Facility-Administered Medications  Medication Dose Route Frequency Provider Last Rate Last Admin  . amoxicillin (AMOXIL) capsule 500  mg  500 mg Oral TID Ripley Fraise, MD      . atorvastatin (LIPITOR) tablet 80 mg  80 mg Oral Daily Ripley Fraise, MD      . buPROPion (WELLBUTRIN XL) 24 hr tablet 150 mg  150 mg Oral Daily Ripley Fraise, MD      . LORazepam (ATIVAN) tablet 1 mg  1 mg Oral Q6H PRN Ripley Fraise, MD   1 mg at 08/10/20 0043  . mirtazapine (REMERON) tablet 7.5 mg  7.5 mg Oral QHS Ripley Fraise, MD   7.5 mg at 08/10/20 0042  . QUEtiapine (SEROQUEL) tablet 25 mg  25 mg Oral TID Ripley Fraise, MD   25 mg at 08/10/20 4098   Current Outpatient  Medications  Medication Sig Dispense Refill  . amoxicillin (AMOXIL) 500 MG capsule Take 1 capsule (500 mg total) by mouth 3 (three) times daily. 21 capsule 0  . atorvastatin (LIPITOR) 80 MG tablet Take 1 tablet (80 mg total) by mouth daily. 30 tablet 0  . buPROPion (WELLBUTRIN XL) 150 MG 24 hr tablet Take 1 tablet (150 mg total) by mouth daily. 30 tablet 0  . colchicine 0.6 MG tablet Take 1 tablet (0.6 mg total) by mouth 2 (two) times daily. 60 tablet 2  . mirtazapine (REMERON) 7.5 MG tablet Take 1 tablet (7.5 mg total) by mouth at bedtime. 30 tablet 0  . Multiple Vitamin (MULTIVITAMIN WITH MINERALS) TABS tablet Take 1 tablet by mouth daily. 30 tablet 0  . Nutritional Supplements (,FEEDING SUPPLEMENT, PROSOURCE PLUS) liquid Take 30 mLs by mouth 2 (two) times daily between meals. 227 mL 0  . pantoprazole (PROTONIX) 40 MG tablet Take 1 tablet (40 mg total) by mouth 2 (two) times daily. 60 tablet 0  . QUEtiapine (SEROQUEL) 25 MG tablet Take 1 tablet (25 mg total) by mouth 3 (three) times daily. 90 tablet 0    Musculoskeletal: Unable to access as evaluation via telepsych.   Psychiatric Specialty Exam: Physical Exam Psychiatric:        Mood and Affect: Mood normal.        Behavior: Behavior normal.     Comments: Substance abuse  Passive suicidal thoughts      Review of Systems  Psychiatric/Behavioral: Positive for suicidal ideas. Negative for agitation, behavioral problems, confusion, decreased concentration, dysphoric mood, hallucinations, self-injury and sleep disturbance. The patient is not nervous/anxious and is not hyperactive.        Substance abuse    All other systems reviewed and are negative.   Blood pressure (!) 150/104, pulse (!) 106, temperature 98.2 F (36.8 C), temperature source Oral, resp. rate 16, SpO2 98 %.There is no height or weight on file to calculate BMI.  General Appearance: Fairly Groomed  Eye Contact:  Fair  Speech:  Clear and Coherent and Normal Rate   Volume:  Normal  Mood:  slighlty irritable  Affect:  Appropriate  Thought Process:  Coherent, Linear and Descriptions of Associations: Intact  Orientation:  Full (Time, Place, and Person)  Thought Content:  Logical  Suicidal Thoughts:  Yes.  without intent/plan  Homicidal Thoughts:  No  Memory:  Immediate;   Fair Recent;   Fair Remote;   Fair  Judgement:  Impaired  Insight:  Shallow  Psychomotor Activity:  Normal  Concentration:  Concentration: Fair and Attention Span: Fair  Recall:  [employer] of Knowledge:  Fair  Language:  Good  Akathisia:  Negative  Handed:  Right  AIMS (if indicated):  Assets:  Communication Skills Desire for Improvement  ADL's:  Intact  Cognition:  WNL  Sleep:        Treatment Plan Summary: Daily contact with patient to assess and evaluate symptoms and progress in treatment  Disposition: Patient endorses SI without plan or intent. His main stressor as reported is his substance abuse. We discussed rehab which he stated he was open. At this time, there is no evidence of imminent risk to self or others at present.  Patient does not meet criteria for psychiatric inpatient admission and is psychiatrically cleared. I vave put in a peer support consultation to assist patient with rehab and housing needs. Patient is aware of outpatient psychiatric services at the . [facility].   ED updated on dispositions and advised that peer support consultation has been ordered.      This service was provided via telemedicine using a 2-way, interactive audio and video technology.  Names of all persons participating in this telemedicine service and their role in this encounter. Name: Marice Angelino  Role: Patient   Name: Mordecai Maes  Role: PMHNP   Mordecai Maes, NP 08/10/2020 8:24 AM

## 2020-08-10 NOTE — Progress Notes (Signed)
Pt has been psychiatrically cleared. Outpatient mental health agencies have been placed in his AVS  Audree Camel, MSW, LCSW, [place] Worker II Disposition CSW [phone]

## 2020-08-10 NOTE — ED Provider Notes (Addendum)
Pt denies any physical complaints.   Pt reports he spoke to pharmacy about his medications.  Vitals stable   TTS evaluated pt and feels he does not meet inpatient criteria.  Social work gave referral information.   I discussed with pt.  Pt states he does not need referrals that he knows how to access [facility].     Fransico Meadow, PA-C 08/10/20 [place], PA-C 08/10/20 1947    Fredia Sorrow, MD 08/15/20 [phone]

## 2020-08-10 NOTE — ED Notes (Addendum)
Patient was upset about being discharged; pt refused discharge vitals and through the discharge paperwork back at this RN; RN attempted to explain reasoning for not giving d/c with Rx; Pt denies receiving 7 day RX  from [facility] on yesterday even though it is noted; Patient was giving belongings and patient exited the unit getting dressed in the [facility]

## 2020-08-10 NOTE — ED Notes (Signed)
Breakfast ordered 

## 2020-08-10 NOTE — Discharge Instructions (Addendum)
[facility] (Assessment, Medication management and counseling) [address], [place], [place] [address] Phone: [phone]  *Some walk-in assessment times available- call number above  [facility] (Medication management and counseling)  Address: [address], [place], [place] [address] Phone: [phone]  *Accepts walk-in's M-F starting at [place] (Counseling specializing in trauma and co-occurring disorders)  [email or web address] [address] [place], [place], [address] [phone] Email: [email or web address]  [facility] (Wellness classes, peer support) [address], [place], [place] [address]  Phone: [phone]  [facility] (individual and group counseling)  [address], [place] [address] [phone]  Suicide Prevention information listed below:  [place]: [phone]  Mobile Crisis Teams  [facility]  Mobile Crisis Care Unit  [phone]

## 2020-08-13 ENCOUNTER — Other Ambulatory Visit: Payer: Self-pay

## 2020-08-13 ENCOUNTER — Observation Stay (HOSPITAL_COMMUNITY)
Admission: EM | Admit: 2020-08-13 | Discharge: 2020-08-16 | Disposition: A | Discharge status: Home / Self Care | Payer: Self-pay | Attending: Family Medicine | Admitting: Family Medicine

## 2020-08-13 DIAGNOSIS — R52 Pain, unspecified: Secondary | ICD-10-CM

## 2020-08-13 DIAGNOSIS — T43291A Poisoning by other antidepressants, accidental (unintentional), initial encounter: Principal | ICD-10-CM | POA: Insufficient documentation

## 2020-08-13 DIAGNOSIS — F419 Anxiety disorder, unspecified: Secondary | ICD-10-CM | POA: Insufficient documentation

## 2020-08-13 DIAGNOSIS — R45851 Suicidal ideations: Secondary | ICD-10-CM

## 2020-08-13 DIAGNOSIS — F1990 Other psychoactive substance use, unspecified, uncomplicated: Secondary | ICD-10-CM | POA: Diagnosis present

## 2020-08-13 DIAGNOSIS — F1910 Other psychoactive substance abuse, uncomplicated: Secondary | ICD-10-CM | POA: Insufficient documentation

## 2020-08-13 DIAGNOSIS — T43292A Poisoning by other antidepressants, intentional self-harm, initial encounter: Secondary | ICD-10-CM

## 2020-08-13 DIAGNOSIS — Z79899 Other long term (current) drug therapy: Secondary | ICD-10-CM | POA: Insufficient documentation

## 2020-08-13 DIAGNOSIS — F329 Major depressive disorder, single episode, unspecified: Secondary | ICD-10-CM | POA: Insufficient documentation

## 2020-08-13 DIAGNOSIS — F1410 Cocaine abuse, uncomplicated: Secondary | ICD-10-CM | POA: Insufficient documentation

## 2020-08-13 DIAGNOSIS — Z7982 Long term (current) use of aspirin: Secondary | ICD-10-CM | POA: Insufficient documentation

## 2020-08-13 DIAGNOSIS — Z20822 Contact with and (suspected) exposure to covid-19: Secondary | ICD-10-CM | POA: Insufficient documentation

## 2020-08-13 LAB — COMPREHENSIVE METABOLIC PANEL
ALT: 42 U/L (ref 0–44)
AST: 45 U/L — ABNORMAL HIGH (ref 15–41)
Albumin: 4.5 g/dL (ref 3.5–5.0)
Alkaline Phosphatase: 95 U/L (ref 38–126)
Anion gap: 10 (ref 5–15)
BUN: 10 mg/dL (ref 6–20)
CO2: 25 mmol/L (ref 22–32)
Calcium: 9.3 mg/dL (ref 8.9–10.3)
Chloride: 104 mmol/L (ref 98–111)
Creatinine, Ser: 1.24 mg/dL (ref 0.61–1.24)
GFR, Estimated: >60 mL/min (ref >60)
Glucose, Bld: 98 mg/dL (ref 70–99)
Potassium: 4.2 mmol/L (ref 3.5–5.1)
Sodium: 139 mmol/L (ref 135–145)
Total Bilirubin: 0.6 mg/dL (ref 0.3–1.2)
Total Protein: 8.5 g/dL — ABNORMAL HIGH (ref 6.5–8.1)

## 2020-08-13 LAB — RAPID URINE DRUG SCREEN, HOSP PERFORMED
Amphetamines: NOT DETECTED
Barbiturates: NOT DETECTED
Benzodiazepines: NOT DETECTED
Cocaine: NOT DETECTED
Opiates: NOT DETECTED
Tetrahydrocannabinol: POSITIVE — AB

## 2020-08-13 LAB — CBC
HCT: 39.8 % (ref 39.0–52.0)
Hemoglobin: 13.1 g/dL (ref 13.0–17.0)
MCH: 30.2 pg (ref 26.0–34.0)
MCHC: 32.9 g/dL (ref 30.0–36.0)
MCV: 91.7 fL (ref 80.0–100.0)
Platelets: 250 10*3/uL (ref 150–400)
RBC: 4.34 MIL/uL (ref 4.22–5.81)
RDW: 12.6 % (ref 11.5–15.5)
WBC: 13.3 10*3/uL — ABNORMAL HIGH (ref 4.0–10.5)
nRBC: 0 % (ref 0.0–0.2)

## 2020-08-13 LAB — CBG MONITORING, ED: Glucose-Capillary: 90 mg/dL (ref 70–99)

## 2020-08-13 LAB — ETHANOL: Alcohol, Ethyl (B): <10 mg/dL (ref <10)

## 2020-08-13 LAB — RESP PANEL BY RT PCR (RSV, FLU A&B, COVID)
Influenza A by PCR: NEGATIVE
Influenza B by PCR: NEGATIVE
Respiratory Syncytial Virus by PCR: NEGATIVE
SARS Coronavirus 2 by RT PCR: NEGATIVE

## 2020-08-13 LAB — ACETAMINOPHEN LEVEL: Acetaminophen (Tylenol), Serum: <10 ug/mL — ABNORMAL LOW (ref 10–30)

## 2020-08-13 LAB — SALICYLATE LEVEL: Salicylate Lvl: <7 mg/dL — ABNORMAL LOW (ref 7.0–30.0)

## 2020-08-13 MED ORDER — THIAMINE HCL 100 MG PO TABS
100.0000 mg | ORAL_TABLET | Freq: Every day | ORAL | Status: DC
  Administered 2020-08-13 – 2020-08-16 (×4): 100 mg via ORAL
  Filled 2020-08-13 (×4): qty 1

## 2020-08-13 MED ORDER — THIAMINE HCL 100 MG/ML IJ SOLN: 100.0000 mg | Freq: Every day | INTRAMUSCULAR | Status: DC

## 2020-08-13 MED ORDER — SODIUM CHLORIDE 0.9% FLUSH
3.0000 mL | Freq: Two times a day (BID) | INTRAVENOUS | Status: DC
  Administered 2020-08-14 – 2020-08-16 (×4): 3 mL via INTRAVENOUS

## 2020-08-13 MED ORDER — LORAZEPAM 2 MG/ML IJ SOLN: 0.0000 mg | Freq: Four times a day (QID) | INTRAMUSCULAR | Status: DC

## 2020-08-13 MED ORDER — LORAZEPAM 1 MG PO TABS: 0.0000 mg | ORAL_TABLET | Freq: Four times a day (QID) | ORAL | Status: DC

## 2020-08-13 MED ORDER — ENOXAPARIN SODIUM 40 MG/0.4ML ~~LOC~~ SOLN
40.0000 mg | SUBCUTANEOUS | Status: DC
  Administered 2020-08-14 – 2020-08-15 (×2): 40 mg via SUBCUTANEOUS
  Filled 2020-08-13 (×3): qty 0.4

## 2020-08-13 MED ORDER — LORAZEPAM 2 MG/ML IJ SOLN: 0.0000 mg | Freq: Two times a day (BID) | INTRAMUSCULAR | Status: DC

## 2020-08-13 MED ORDER — LORAZEPAM 1 MG PO TABS: 0.0000 mg | ORAL_TABLET | Freq: Two times a day (BID) | ORAL | Status: DC

## 2020-08-13 NOTE — H&P (Signed)
History and Physical    KEHINDE BOWDISH KYH:[id number] DOB: 16-Aug-1986 DOA: 08/13/2020  PCP: Beverly Sessions  Patient coming from: Home  I have personally briefly reviewed patient's old medical records in [place]  Chief Complaint: Suicidal ideation, intentional bupropion overdose  HPI: ARIZ TERRONES is a 34 y.o. male with medical history significant for polysubstance use (cocaine, alcohol, heroin, methamphetamine, tobacco), anxiety/depression, peptic ulcers who presented to [facility] long ED after reported intentional overdose of bupropion.  Patient says he has been having suicidal ideation and has had uncontrolled depression recently.  He says he took 6 tablets of Wellbutrin 300 mg earlier today (08/13/2020) with intent to harm himself.  He says shortly afterward he found himself in the woods with multiple scratch type marks all over his upper and lower extremities.  He thinks he might have been running through briars.  He says he was having blurry vision.  He otherwise denies any subjective fevers, chills, diaphoresis, chest pain, palpitations, dyspnea, cough, abdominal pain, nausea, vomiting, or diarrhea.  He reports a history of polysubstance abuse, mainly methamphetamine and relapsed several weeks ago.  He says he has not used any methamphetamine, cocaine, IV drugs in the last few days.  He does report occasional marijuana use.  He reports drinking a pint of beer daily.  Of note, patient was recently admitted 07/15/2020-07/30/2020 at [facility] for aspiration pneumonia.  He was treated with IV Vanco/cefepime before transition to Levaquin.  He was also noted to have gastritis on upper endoscopy treated with PPI.  He was discharged to the [facility] and drug abuse treatment Center in [place], [place].  ED Course:  Initial vitals showed BP 144/99, pulse 82, RR 18, temp 98.1 Fahrenheit, SPO2 100% on room air.  Labs show sodium 139, potassium 4.2, bicarb 25, BUN 10, creatinine 1.24, serum  glucose 98, AST 45, ALT 42, alk phos 95, total bilirubin 0.6, WBC 13.3, hemoglobin 13.1, platelets 250,000.  Serum ethanol, salicylate, and acetaminophen levels are undetectable.  UDS is positive for THC.  SARS-CoV-2 PCR is negative.  Influenza A/B PCR's are negative.  EDP discussed with poison control who recommended 20-hour observation with serial EKGs to monitor for QTC or QRS prolongation.  TTS consult was placed and pending.  The hospitalist service was consulted to admit for further evaluation and management.  Review of Systems: All systems reviewed and are negative except as documented in history of present illness above.   Past Medical History:  Diagnosis Date   Alcohol abuse    Anxiety    Cocaine abuse ([place])    Depression    Kidney stones    Narcotic abuse ([place])    Peptic ulcer    Polysubstance abuse ([employer])    Renal disorder    kidney stones    Past Surgical History:  Procedure Laterality Date   ESOPHAGOGASTRODUODENOSCOPY N/A 05/29/2014   Procedure: ESOPHAGOGASTRODUODENOSCOPY (EGD) ;  Surgeon: Beryle Beams, MD;  Location: [facility] ENDOSCOPY;  Service: Endoscopy;  Laterality: N/A;  check with Dr. Collene Mares about sedation type/timinng - I recommend MAC   HAND SURGERY  2006   LEFT HEART CATH AND CORONARY ANGIOGRAPHY N/A 06/21/2020   Procedure: LEFT HEART CATH AND CORONARY ANGIOGRAPHY;  Surgeon: Nelva Bush, MD;  Location: [facility];  Service: Cardiovascular;  Laterality: N/A;    Social History:  reports that he has been smoking cigarettes. He has been smoking about 0.50 packs per day. He has never used smokeless tobacco. He reports previous alcohol use. He  reports previous drug use. Drugs: Marijuana, Cocaine, and IV.  Allergies  Allergen Reactions   Fish Allergy Anaphylaxis   Shellfish Allergy Anaphylaxis    Family History  Problem Relation Age of Onset   Heart disease Father      Prior to Admission medications   Medication Sig Start Date End  Date Taking? Authorizing Provider  amoxicillin (AMOXIL) 500 MG capsule Take 1 capsule (500 mg total) by mouth 3 (three) times daily. 08/09/20   Veryl Speak, MD  aspirin 81 MG chewable tablet Chew 81 mg by mouth daily.    [provider]  atorvastatin (LIPITOR) 80 MG tablet Take 1 tablet (80 mg total) by mouth daily. Patient taking differently: Take 80 mg by mouth at bedtime.  06/22/20 08/10/20  Sanjuan Dame, MD  buprenorphine (SUBUTEX) 2 MG SUBL SL tablet Place 4 mg under the tongue in the morning and at bedtime.    [provider]  buPROPion (WELLBUTRIN XL) 150 MG 24 hr tablet Take 1 tablet (150 mg total) by mouth daily. Patient not taking: Reported on 08/10/2020 06/05/20   Rankin, Shuvon B, NP  buPROPion (WELLBUTRIN XL) 150 MG 24 hr tablet Take 300 mg by mouth daily. 07/31/20   [provider]  colchicine 0.6 MG tablet Take 1 tablet (0.6 mg total) by mouth 2 (two) times daily. Patient not taking: Reported on 08/10/2020 07/04/20 10/02/20  Swayze, Ava, DO  mirtazapine (REMERON) 7.5 MG tablet Take 1 tablet (7.5 mg total) by mouth at bedtime. Patient taking differently: Take 30 mg by mouth at bedtime.  06/04/20   Rankin, Shuvon B, NP  Multiple Vitamin (MULTIVITAMIN WITH MINERALS) TABS tablet Take 1 tablet by mouth daily. 07/04/20   Swayze, Ava, DO  Nutritional Supplements (,FEEDING SUPPLEMENT, PROSOURCE PLUS) liquid Take 30 mLs by mouth 2 (two) times daily between meals. Patient not taking: Reported on 08/10/2020 07/04/20   Swayze, Ava, DO  OLANZapine (ZYPREXA) 10 MG tablet Take 10 mg by mouth at bedtime.    [provider]  pantoprazole (PROTONIX) 40 MG tablet Take 1 tablet (40 mg total) by mouth 2 (two) times daily. 07/04/20 08/10/20  Swayze, Ava, DO  QUEtiapine (SEROQUEL) 25 MG tablet Take 1 tablet (25 mg total) by mouth 3 (three) times daily. Patient not taking: Reported on 08/10/2020 06/04/20   Rankin, Shuvon B, NP  famotidine (PEPCID) 20 MG tablet Take 1 tablet (20  mg total) by mouth 2 (two) times daily. Patient not taking: Reported on 11/04/2019 08/30/19 05/04/20  Joy, Helane Gunther, PA-C  FLUoxetine (PROZAC) 20 MG capsule Take 1 capsule (20 mg total) by mouth daily. For depression Patient not taking: Reported on 11/04/2019 04/01/18 05/04/20  Lindell Spar I, NP  gabapentin (NEURONTIN) 300 MG capsule Take 2 capsules (600 mg total) by mouth 3 (three) times daily. For agitation Patient not taking: Reported on 05/02/2020 03/31/18 05/04/20  Lindell Spar I, NP  sucralfate (CARAFATE) 1 g tablet Take 1 tablet (1 g total) by mouth 4 (four) times daily -  with meals and at bedtime. Patient not taking: Reported on 11/04/2019 08/30/19 05/04/20  Joy, Helane Gunther, PA-C  traZODone (DESYREL) 50 MG tablet Take 1 tablet (50 mg total) by mouth at bedtime as needed for sleep. Patient not taking: Reported on 08/30/2019 03/31/18 08/30/19  Lindell Spar I, NP    Physical Exam: Vitals:   08/13/20 1545 08/13/20 2204 08/13/20 2343  BP: (!) 144/99 118/78 103/67  Pulse: 82 77 (!) 53  Resp: _0 Temp:  98.1 F (36.7 C)    TempSrc: Oral    SpO2: 100% 100% 97%  Weight: 77.1 kg    Height: _0  (1.727 m)     Constitutional: Resting supine in bed, NAD, calm, comfortable Eyes: PERRL, lids and conjunctivae normal ENMT: Mucous membranes are moist. Posterior pharynx clear of any exudate or lesions. Neck: normal, supple, no masses. Respiratory: clear to auscultation bilaterally, no wheezing, no crackles. Normal respiratory effort. No accessory muscle use.  Cardiovascular: Regular rate and rhythm, no murmurs / rubs / gallops. No extremity edema. 2+ pedal pulses. Abdomen: no tenderness, no masses palpated. No hepatosplenomegaly. Bowel sounds positive.  Musculoskeletal: no clubbing / cyanosis. No joint deformity upper and lower extremities. Good ROM, no contractures. Normal muscle tone.  Skin: Multiple linear scratch type lesions all over upper and lower extremities.  No open wounds or obvious  infection. Neurologic: CN 2-12 grossly intact. Sensation intact, Strength 5/5 in all 4.  Psychiatric: Awake, alert, and oriented.  Reports continued suicidal ideation.  Denies any homicidal ideation.   Labs on Admission: I have personally reviewed following labs and imaging studies  CBC: Recent Labs  Lab 08/09/20 2021 08/13/20 1650  WBC 10.9* 13.3*  HGB 12.9* 13.1  HCT 39.7 39.8  MCV 93.2 91.7  PLT 282 947   Basic Metabolic Panel: Recent Labs  Lab 08/09/20 2021 08/13/20 1650  NA 136 139  K 3.8 4.2  CL 99 104  CO2 26 25  GLUCOSE 94 98  BUN 17 10  CREATININE 1.37* 1.24  CALCIUM 9.6 9.3   GFR: Estimated Creatinine Clearance: 79.7 mL/min (by C-G formula based on SCr of 1.24 mg/dL). Liver Function Tests: Recent Labs  Lab 08/09/20 2021 08/13/20 1650  AST 42* 45*  ALT 41 42  ALKPHOS 89 95  BILITOT 0.6 0.6  PROT 8.0 8.5*  ALBUMIN 4.2 4.5   No results for input(s): LIPASE, AMYLASE in the last 168 hours. No results for input(s): AMMONIA in the last 168 hours. Coagulation Profile: No results for input(s): INR, PROTIME in the last 168 hours. Cardiac Enzymes: No results for input(s): CKTOTAL, CKMB, CKMBINDEX, TROPONINI in the last 168 hours. BNP (last 3 results) No results for input(s): PROBNP in the last 8760 hours. HbA1C: No results for input(s): HGBA1C in the last 72 hours. CBG: Recent Labs  Lab 08/13/20 1652  GLUCAP 90   Lipid Profile: No results for input(s): CHOL, HDL, LDLCALC, TRIG, CHOLHDL, LDLDIRECT in the last 72 hours. Thyroid Function Tests: No results for input(s): TSH, T4TOTAL, FREET4, T3FREE, THYROIDAB in the last 72 hours. Anemia Panel: No results for input(s): VITAMINB12, FOLATE, FERRITIN, TIBC, IRON, RETICCTPCT in the last 72 hours. Urine analysis:    Component Value Date/Time   COLORURINE YELLOW 07/01/2020 2255   APPEARANCEUR HAZY (A) 07/01/2020 2255   LABSPEC 1.028 07/01/2020 2255   PHURINE 5.0 07/01/2020 2255   GLUCOSEU NEGATIVE  07/01/2020 2255   HGBUR NEGATIVE 07/01/2020 2255   BILIRUBINUR NEGATIVE 07/01/2020 2255   KETONESUR 5 (A) 07/01/2020 2255   PROTEINUR >=300 (A) 07/01/2020 2255   UROBILINOGEN 0.2 05/21/2015 0116   NITRITE NEGATIVE 07/01/2020 2255   LEUKOCYTESUR NEGATIVE 07/01/2020 2255    Radiological Exams on Admission: No results found.  EKG: Personally reviewed. Initial EKG showed sinus rhythm without acute ischemic changes.  No QRS or QTC prolongation.  Repeat EKG again showed sinus rhythm without prolonged QRS or QTC.  Assessment/Plan Principal Problem:   Bupropion overdose Active Problems:   Substance use disorder  Suicidal ideation  TYSEN ROESLER is a 34 y.o. male with medical history significant for polysubstance use (cocaine, alcohol, heroin, methamphetamine, tobacco), anxiety/depression, peptic ulcer disease who is admitted with intentional overdose of bupropion.  Intentional overdose of bupropion with suicidal ideation: Reports taking 6 tablets of 300 mg Wellbutrin.  Poison control recommended 20-hour observation with serial EKGs to monitor for QRS or QTC prolongation.  Initial EKGs and labs are stable.  He does report continued suicidal ideation. -Monitor on telemetry -obtain serial EKGs every 4 hours to monitor for QRS or QTC prolongation -TTS consult placed in the ED and pending -Continue suicide precautions with one-to-one sitter -Continue seizure precautions -Anticipate medical clearance tomorrow and will need psychiatry consult for disposition planning  Polysubstance use disorder: History of methamphetamine, cocaine, IV drug, alcohol, marijuana, and tobacco use.  Reports using only marijuana and small amount of alcohol recently.  Currently no signs of withdrawal.  Continue to monitor with CIWA checks alone.  Add IV Ativan if displaying withdrawal symptoms.  DVT prophylaxis: Lovenox Code Status: Full code Family Communication: Discussed with patient, he does not want to  notify family at this time. Disposition Plan: From home, likely to inpatient behavioral health pending psychiatry evaluation. Consults called: Needs formal psychiatry consult in a.m. once medically cleared. Admission status:  Status is: Observation  The patient remains OBS appropriate and will d/c before 2 midnights.  Dispo: The patient is from: Home              Anticipated d/c is to: Inpatient psych              Anticipated d/c date is: 1 day              Patient currently is not medically stable to d/c.    Zada Finders MD [facility]  If 7PM-7AM, please contact night-coverage [email or web address]  08/14/2020, 12:19 AM

## 2020-08-13 NOTE — ED Triage Notes (Signed)
Patient reports to the ER for c/o suicidal ideation. Patient reports he has been thinking of hurting himself and overdosing on medication. Patient reports he took his Wellbutrin this morning at 0600 and "toook too many"

## 2020-08-13 NOTE — ED Notes (Signed)
Pt changed into burgandy scrubs and wanded by security.  Belongings collected and contain clothes and a hat.  Belongings are in the triage nurses station cabinet.

## 2020-08-13 NOTE — ED Provider Notes (Signed)
[facility] DEPT Provider Note   CSN: [id number] Arrival date & time: 08/13/20  1516     History Chief Complaint  Patient presents with  . Suicidal    Brian [name] is a 34 y.o. male with a past medical history of substance abuse, alcohol abuse presenting to the ED with a chief complaint of suicidal ideation.  Reports taking 6 tablets of 300 mg Wellbutrin this morning at around 6 AM.  Took 6 tablets yesterday as well but "they were spaced out not all at once like today."  Reports drinking alcohol for the past few days after stopping for the past few weeks.  Reports benzo use as well recently.  Denies any vomiting, nausea, chest pain, abdominal pain.  He does report blurry vision bilateral eyes since this morning after taking the Wellbutrin.  Denies any injuries or falls, numbness in arms or legs, headache. Denies HI, AVH.  HPI     Past Medical History:  Diagnosis Date  . Alcohol abuse   . Anxiety   . Cocaine abuse ([place])   . Depression   . Kidney stones   . Narcotic abuse ([place])   . Peptic ulcer   . Polysubstance abuse ([place])   . Renal disorder    kidney stones    Patient Active Problem List   Diagnosis Date Noted  . Bupropion overdose 08/13/2020  . Substance use disorder 08/13/2020  . Suicidal ideation 08/13/2020  . Pericarditis 07/02/2020  . Depression 07/02/2020  . Elevated troponin   . Non-ST elevation (NSTEMI) myocardial infarction ([place])   . Chest pain 06/19/2020  . 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 Malingering 06/04/2020  . Paranoia ([place])   . Amphetamine use disorder, severe, dependence ([place]) 03/26/2018  . Substance induced mood disorder ([place]) 03/26/2018  . Bipolar II disorder, severe, depressed, with anxious distress ([place]) 03/25/2018  . Cellulitis 05/20/2015  . Dysuria 05/20/2015  . Smoker 05/20/2015  . Homeless single person 05/20/2015  . Drug abuse ([place])   . Opioid use disorder, severe,  dependence ([place]) 03/08/2015  . Ileus of unspecified type ([place]) 05/28/2014  . Abdominal pain 05/28/2014  . AKI (acute kidney injury) ([place]) 05/28/2014  . Esophagitis, acute 05/28/2014  . Polysubstance abuse ([place]) 05/28/2014  . Hematemesis 05/28/2014    Past Surgical History:  Procedure Laterality Date  . ESOPHAGOGASTRODUODENOSCOPY N/A 05/29/2014   Procedure: ESOPHAGOGASTRODUODENOSCOPY (EGD) ;  Surgeon: Beryle Beams, MD;  Location: [facility] ENDOSCOPY;  Service: Endoscopy;  Laterality: N/A;  check with Dr. Collene Mares about sedation type/timinng - I recommend MAC  . HAND SURGERY  2006  . LEFT HEART CATH AND CORONARY ANGIOGRAPHY N/A 06/21/2020   Procedure: LEFT HEART CATH AND CORONARY ANGIOGRAPHY;  Surgeon: Nelva Bush, MD;  Location: [facility] CV LAB;  Service: Cardiovascular;  Laterality: N/A;       Family History  Problem Relation Age of Onset  . Heart disease Father     Social History   Tobacco Use  . Smoking status: Current Every Day Smoker    Packs/day: 0.50    Types: Cigarettes  . Smokeless tobacco: Never Used  Vaping Use  . Vaping Use: Never used  Substance Use Topics  . Alcohol use: Not Currently  . Drug use: Not Currently    Types: Marijuana, Cocaine, IV    Comment: heroin and meth    Home Medications Prior to Admission medications   Medication Sig Start Date End Date Taking? Authorizing Provider  amoxicillin (AMOXIL) 500 MG capsule  Take 1 capsule (500 mg total) by mouth 3 (three) times daily. 08/09/20   Veryl Speak, MD  aspirin 81 MG chewable tablet Chew 81 mg by mouth daily.    [provider]  atorvastatin (LIPITOR) 80 MG tablet Take 1 tablet (80 mg total) by mouth daily. Patient taking differently: Take 80 mg by mouth at bedtime.  06/22/20 08/10/20  Sanjuan Dame, MD  buprenorphine (SUBUTEX) 2 MG SUBL SL tablet Place 4 mg under the tongue in the morning and at bedtime.    [provider]  buPROPion (WELLBUTRIN XL) 150 MG 24 hr tablet Take 1  tablet (150 mg total) by mouth daily. Patient not taking: Reported on 08/10/2020 06/05/20   Rankin, Shuvon B, NP  buPROPion (WELLBUTRIN XL) 150 MG 24 hr tablet Take 300 mg by mouth daily. 07/31/20   [provider]  colchicine 0.6 MG tablet Take 1 tablet (0.6 mg total) by mouth 2 (two) times daily. Patient not taking: Reported on 08/10/2020 07/04/20 10/02/20  Swayze, Ava, DO  mirtazapine (REMERON) 7.5 MG tablet Take 1 tablet (7.5 mg total) by mouth at bedtime. Patient taking differently: Take 30 mg by mouth at bedtime.  06/04/20   Rankin, Shuvon B, NP  Multiple Vitamin (MULTIVITAMIN WITH MINERALS) TABS tablet Take 1 tablet by mouth daily. 07/04/20   Swayze, Ava, DO  Nutritional Supplements (,FEEDING SUPPLEMENT, PROSOURCE PLUS) liquid Take 30 mLs by mouth 2 (two) times daily between meals. Patient not taking: Reported on 08/10/2020 07/04/20   Swayze, Ava, DO  OLANZapine (ZYPREXA) 10 MG tablet Take 10 mg by mouth at bedtime.    [provider]  pantoprazole (PROTONIX) 40 MG tablet Take 1 tablet (40 mg total) by mouth 2 (two) times daily. 07/04/20 08/10/20  Swayze, Ava, DO  QUEtiapine (SEROQUEL) 25 MG tablet Take 1 tablet (25 mg total) by mouth 3 (three) times daily. Patient not taking: Reported on 08/10/2020 06/04/20   Rankin, Shuvon B, NP  famotidine (PEPCID) 20 MG tablet Take 1 tablet (20 mg total) by mouth 2 (two) times daily. Patient not taking: Reported on 11/04/2019 08/30/19 05/04/20  Joy, Helane Gunther, PA-C  FLUoxetine (PROZAC) 20 MG capsule Take 1 capsule (20 mg total) by mouth daily. For depression Patient not taking: Reported on 11/04/2019 04/01/18 05/04/20  Lindell Spar I, NP  gabapentin (NEURONTIN) 300 MG capsule Take 2 capsules (600 mg total) by mouth 3 (three) times daily. For agitation Patient not taking: Reported on 05/02/2020 03/31/18 05/04/20  Lindell Spar I, NP  sucralfate (CARAFATE) 1 g tablet Take 1 tablet (1 g total) by mouth 4 (four) times daily -  with meals and at bedtime. Patient not  taking: Reported on 11/04/2019 08/30/19 05/04/20  Joy, Helane Gunther, PA-C  traZODone (DESYREL) 50 MG tablet Take 1 tablet (50 mg total) by mouth at bedtime as needed for sleep. Patient not taking: Reported on 08/30/2019 03/31/18 08/30/19  Lindell Spar I, NP    Allergies    Fish allergy and Shellfish allergy  Review of Systems   Review of Systems  Constitutional: Negative for appetite change, chills and fever.  HENT: Negative for ear pain, rhinorrhea, sneezing and sore throat.   Eyes: Positive for visual disturbance. Negative for photophobia.  Respiratory: Negative for cough, chest tightness, shortness of breath and wheezing.   Cardiovascular: Negative for chest pain and palpitations.  Gastrointestinal: Negative for abdominal pain, blood in stool, constipation, diarrhea, nausea and vomiting.  Genitourinary: Negative for dysuria, hematuria and urgency.  Musculoskeletal: Negative for myalgias.  Skin: Negative for rash.  Neurological: Negative for dizziness, weakness and light-headedness.    Physical Exam Updated Vital Signs BP (!) 144/99 (BP Location: Right Arm)   Pulse 82   Temp 98.1 F (36.7 C) (Oral)   Resp 18   Ht _0  (1.727 m)   Wt 77.1 kg   SpO2 100%   BMI 25.85 kg/m   Physical Exam Vitals and nursing note reviewed.  Constitutional:      General: He is not in acute distress.    Appearance: He is well-developed.  HENT:     Head: Normocephalic and atraumatic.     Nose: Nose normal.  Eyes:     General: No scleral icterus.       Right eye: No discharge.        Left eye: No discharge.     Conjunctiva/sclera: Conjunctivae normal.     Pupils: Pupils are equal, round, and reactive to light.  Cardiovascular:     Rate and Rhythm: Normal rate and regular rhythm.     Heart sounds: Normal heart sounds. No murmur heard.  No friction rub. No gallop.   Pulmonary:     Effort: Pulmonary effort is normal. No respiratory distress.     Breath sounds: Normal breath sounds.  Abdominal:      General: Bowel sounds are normal. There is no distension.     Palpations: Abdomen is soft.     Tenderness: There is no abdominal tenderness. There is no guarding.  Musculoskeletal:        General: Normal range of motion.     Cervical back: Normal range of motion and neck supple.  Skin:    General: Skin is warm and dry.     Findings: No rash.     Comments: Superficial scratches noted to bilateral upper extremities.  No active bleeding.  Neurological:     General: No focal deficit present.     Mental Status: He is alert and oriented to person, place, and time.     Cranial Nerves: No cranial nerve deficit.     Sensory: No sensory deficit.     Motor: No abnormal muscle tone.     Coordination: Coordination normal.     ED Results / Procedures / Treatments   Labs (all labs ordered are listed, but only abnormal results are displayed) Labs Reviewed  COMPREHENSIVE METABOLIC PANEL - Abnormal; Notable for the following components:      Result Value   Total Protein 8.5 (*)    AST 45 (*)    All other components within normal limits  SALICYLATE LEVEL - Abnormal; Notable for the following components:   Salicylate Lvl <9.5 (*)    All other components within normal limits  ACETAMINOPHEN LEVEL - Abnormal; Notable for the following components:   Acetaminophen (Tylenol), Serum <10 (*)    All other components within normal limits  CBC - Abnormal; Notable for the following components:   WBC 13.3 (*)    All other components within normal limits  RAPID URINE DRUG SCREEN, HOSP PERFORMED - Abnormal; Notable for the following components:   Tetrahydrocannabinol POSITIVE (*)    All other components within normal limits  RESP PANEL BY RT PCR (RSV, FLU A&B, COVID)  ETHANOL  CBG MONITORING, ED    EKG EKG Interpretation  Date/Time:  Monday August 13 2020 15:49:45 EDT Ventricular Rate:  81 PR Interval:    QRS Duration: 87 QT Interval:  368 QTC Calculation: 428 R Axis:   98  Text  Interpretation: Sinus rhythm Borderline right axis deviation ST elev, probable normal early repol pattern Confirmed by Dene Gentry [phone]) on 08/13/2020 4:28:08 PM   Radiology No results found.  Procedures Procedures (including critical care time)  Medications Ordered in ED Medications  LORazepam (ATIVAN) injection 0-4 mg (0 mg Intravenous Not Given 08/13/20 1807)    Or  LORazepam (ATIVAN) tablet 0-4 mg ( Oral See Alternative 08/13/20 1807)  LORazepam (ATIVAN) injection 0-4 mg (has no administration in time range)    Or  LORazepam (ATIVAN) tablet 0-4 mg (has no administration in time range)  thiamine tablet 100 mg (100 mg Oral Given 08/13/20 1811)    Or  thiamine (B-1) injection 100 mg ( Intravenous See Alternative 08/13/20 1811)    ED Course  I have reviewed the triage vital signs and the nursing notes.  Pertinent labs & imaging results that were available during my care of the patient were reviewed by me and considered in my medical decision making (see chart for details).  Clinical Course as of Aug 13 2148  Mon Aug 13, 2020  1706 Consulted poison control over the phone.  They recommend 20-hour observation from time of arrival.  Will need EKG every 4-5 hours, looking for QRS widening and QT prolongation.  Ordered cardiac monitoring, fluids.  Will give benzos if needed for seizures.   [HK]  2101 EKG after 4hrs remains similar to prior without QT prolongation.  Patient remains at baseline.   [HK]    Clinical Course User Index [HK] Delia Heady, PA-C   MDM Rules/Calculators/A&P                          [phone] M presenting to the ED with suicidal ideation and attempted overdose.  He took 6 tablets of his Wellbutrin 300 mg this morning around 6 AM.  Took 6 tablets yesterday as well but not all at once.  He does admit to drinking alcohol for the past few days but does not otherwise drink daily.  Reports recent benzo use as well.  Reports blurry vision but denies any seizures or  other symptoms.  He continues to endorse suicidal ideation but denies any HI, AVH.  No neurological deficits noted.  No tachycardia.  EKG shows normal QT and QRS.  Medical screening lab work including CBC, CMP, Tylenol and salicylate level unremarkable.  Ethanol level is 0.  Per poison control recommendations patient will need 20-hour observation.  From the time of presentation here in the ED.  His second EKG is similar to prior. Will admit to medicine service for ongoing observation, and will order TTS consult in the meantime for psych assessment.    Portions of this note were generated with [employer]. Dictation errors may occur despite best attempts at proofreading.  Final Clinical Impression(s) / ED Diagnoses Final diagnoses:  Suicidal ideation  Bupropion overdose, intentional self-harm, initial encounter [facility])    Rx / [place] Orders ED Discharge Orders    None       Delia Heady, PA-C 08/13/20 2149    Valarie Merino, MD 08/13/20 [phone]

## 2020-08-14 ENCOUNTER — Other Ambulatory Visit: Payer: Self-pay

## 2020-08-14 ENCOUNTER — Encounter (HOSPITAL_COMMUNITY): Payer: Self-pay | Admitting: Internal Medicine

## 2020-08-14 DIAGNOSIS — T43292A Poisoning by other antidepressants, intentional self-harm, initial encounter: Secondary | ICD-10-CM

## 2020-08-14 LAB — COMPREHENSIVE METABOLIC PANEL
ALT: 35 U/L (ref 0–44)
AST: 34 U/L (ref 15–41)
Albumin: 3.8 g/dL (ref 3.5–5.0)
Alkaline Phosphatase: 76 U/L (ref 38–126)
Anion gap: 10 (ref 5–15)
BUN: 10 mg/dL (ref 6–20)
CO2: 23 mmol/L (ref 22–32)
Calcium: 8.8 mg/dL — ABNORMAL LOW (ref 8.9–10.3)
Chloride: 105 mmol/L (ref 98–111)
Creatinine, Ser: 1.19 mg/dL (ref 0.61–1.24)
GFR, Estimated: >60 mL/min (ref >60)
Glucose, Bld: 90 mg/dL (ref 70–99)
Potassium: 3.6 mmol/L (ref 3.5–5.1)
Sodium: 138 mmol/L (ref 135–145)
Total Bilirubin: 0.7 mg/dL (ref 0.3–1.2)
Total Protein: 7.2 g/dL (ref 6.5–8.1)

## 2020-08-14 LAB — CBC
HCT: 35.7 % — ABNORMAL LOW (ref 39.0–52.0)
Hemoglobin: 11.7 g/dL — ABNORMAL LOW (ref 13.0–17.0)
MCH: 30.2 pg (ref 26.0–34.0)
MCHC: 32.8 g/dL (ref 30.0–36.0)
MCV: 92.2 fL (ref 80.0–100.0)
Platelets: 206 10*3/uL (ref 150–400)
RBC: 3.87 MIL/uL — ABNORMAL LOW (ref 4.22–5.81)
RDW: 12.7 % (ref 11.5–15.5)
WBC: 8.1 10*3/uL (ref 4.0–10.5)
nRBC: 0 % (ref 0.0–0.2)

## 2020-08-14 MED ORDER — QUETIAPINE FUMARATE 25 MG PO TABS
25.0000 mg | ORAL_TABLET | Freq: Two times a day (BID) | ORAL | Status: DC
  Administered 2020-08-14 – 2020-08-16 (×4): 25 mg via ORAL
  Filled 2020-08-14 (×4): qty 1

## 2020-08-14 MED ORDER — AMOXICILLIN 250 MG PO CAPS
500.0000 mg | ORAL_CAPSULE | Freq: Three times a day (TID) | ORAL | Status: DC
  Administered 2020-08-14 – 2020-08-16 (×7): 500 mg via ORAL
  Filled 2020-08-14 (×7): qty 2

## 2020-08-14 MED ORDER — BUPRENORPHINE HCL-NALOXONE HCL 2-0.5 MG SL SUBL: 1.0000 | SUBLINGUAL_TABLET | Freq: Every day | SUBLINGUAL | Status: DC

## 2020-08-14 MED ORDER — HALOPERIDOL LACTATE 5 MG/ML IJ SOLN: 5.0000 mg | Freq: Four times a day (QID) | INTRAMUSCULAR | Status: DC | PRN

## 2020-08-14 MED ORDER — OLANZAPINE 5 MG PO TABS
10.0000 mg | ORAL_TABLET | Freq: Every day | ORAL | Status: DC
  Administered 2020-08-14 – 2020-08-15 (×2): 10 mg via ORAL
  Filled 2020-08-14 (×2): qty 2

## 2020-08-14 MED ORDER — HALOPERIDOL LACTATE 5 MG/ML IJ SOLN
INTRAMUSCULAR | Status: AC
  Administered 2020-08-14: 5 mg via INTRAMUSCULAR
  Filled 2020-08-14: qty 1

## 2020-08-14 MED ORDER — QUETIAPINE FUMARATE 25 MG PO TABS: 25.0000 mg | ORAL_TABLET | Freq: Two times a day (BID) | ORAL | Status: DC

## 2020-08-14 MED ORDER — MIRTAZAPINE 15 MG PO TABS
30.0000 mg | ORAL_TABLET | Freq: Every day | ORAL | Status: DC
  Administered 2020-08-14 – 2020-08-15 (×2): 30 mg via ORAL
  Filled 2020-08-14 (×2): qty 2

## 2020-08-14 MED ORDER — LORAZEPAM 2 MG/ML IJ SOLN
1.0000 mg | Freq: Once | INTRAMUSCULAR | Status: AC | PRN
  Administered 2020-08-16: 1 mg via INTRAVENOUS
  Filled 2020-08-14: qty 1

## 2020-08-14 MED ORDER — ADULT MULTIVITAMIN W/MINERALS CH
1.0000 | ORAL_TABLET | Freq: Every day | ORAL | Status: DC
  Administered 2020-08-14 – 2020-08-16 (×3): 1 via ORAL
  Filled 2020-08-14 (×3): qty 1

## 2020-08-14 MED ORDER — HYDROXYZINE HCL 10 MG PO TABS
10.0000 mg | ORAL_TABLET | Freq: Three times a day (TID) | ORAL | Status: DC | PRN
  Filled 2020-08-14: qty 1

## 2020-08-14 MED ORDER — ATORVASTATIN CALCIUM 40 MG PO TABS
80.0000 mg | ORAL_TABLET | Freq: Every day | ORAL | Status: DC
  Administered 2020-08-14 – 2020-08-15 (×2): 80 mg via ORAL
  Filled 2020-08-14 (×2): qty 2

## 2020-08-14 MED ORDER — PANTOPRAZOLE SODIUM 40 MG PO TBEC
40.0000 mg | DELAYED_RELEASE_TABLET | Freq: Two times a day (BID) | ORAL | Status: DC
  Administered 2020-08-14 – 2020-08-16 (×5): 40 mg via ORAL
  Filled 2020-08-14 (×5): qty 1

## 2020-08-14 MED ORDER — MELATONIN 3 MG PO TABS
3.0000 mg | ORAL_TABLET | Freq: Every day | ORAL | Status: DC
  Administered 2020-08-14 – 2020-08-15 (×2): 3 mg via ORAL
  Filled 2020-08-14 (×2): qty 1

## 2020-08-14 MED ORDER — GABAPENTIN 300 MG PO CAPS
300.0000 mg | ORAL_CAPSULE | Freq: Three times a day (TID) | ORAL | Status: DC
  Administered 2020-08-14 – 2020-08-16 (×6): 300 mg via ORAL
  Filled 2020-08-14 (×6): qty 1

## 2020-08-14 MED ORDER — LORAZEPAM 2 MG/ML IJ SOLN
1.0000 mg | INTRAMUSCULAR | Status: DC | PRN
  Administered 2020-08-14 – 2020-08-16 (×5): 1 mg via INTRAVENOUS
  Filled 2020-08-14 (×5): qty 1

## 2020-08-14 MED ORDER — ASPIRIN 81 MG PO CHEW
81.0000 mg | CHEWABLE_TABLET | Freq: Every day | ORAL | Status: DC
  Administered 2020-08-14 – 2020-08-16 (×3): 81 mg via ORAL
  Filled 2020-08-14 (×3): qty 1

## 2020-08-14 NOTE — Progress Notes (Signed)
At 1225 pt came out into the hallway very loudly stating "take this shit off my friend is on her way to take me to [facility] where they will actually help me" while ripping his telemetry monitor wires off. RN contacted Dr. Cruzita Lederer who reports TOC had been notified that IVC paperwork needed to be started. Security was contacted as the patient was becoming verbally aggressive. RN Forensic scientist, Jinny Blossom to notify of situation.  Security arrived at bedside to control the environment as the patient was being irrational out in the hallway. Nursing leadership also present. AC spoke to pt regarding his concerns. Pt reported his friend was in the parking lot waiting for him. AC went to bring her up to the patients room where she calmed the patient down. Pt agreed to stay and in the event he were being discharged or he needed something he could contact her. Pt was made aware that he is now IVC'd while waiting for inpatient behavioral health bed.   At 1345 pt verified home meds with patient and also verified home meds with Dr. Cruzita Lederer. RN informed Dr. Cruzita Lederer that pt is still concerned about not receiving his Subutex. Pt gave RN the name of the provider at [facility], Dr. Lenell Antu, that prescribed Subutex to him. This RN spoke with pharmacy about verifying prescription of Subtuex so that it could be ordered here for the patient.   At [facility] pharmacist, Stanton Kidney, called RN to report that she spoke to Dr. Cruzita Lederer and he is going to do a physician to physician phone call to verify the subtext prescription.   All questions addressed with the patient. Sitter remains at bedside. RN informed pt that physician has ordered Vistaril for anxiety and pt reports "that don't f*cking do shit. It's like a hyped up benadryl." RN confirmed with pt that he did not want the Vistaril in which he reports "hell no." Pt reports he wants ativan. RN informed Dr. Cruzita Lederer of pt's request for Ativan and no orders were received.  Pt also refusing to put his telemetry monitor back on. [employer] notified.

## 2020-08-14 NOTE — Progress Notes (Signed)
RN at bedside multiple times regarding pt's suboxone that he has been requesting. RN in collaboration with MD and pharmacy regarding suboxone orders. Pharamacist, Stanton Kidney, called this RN back to inform me that she notified Dr. Cruzita Lederer that pt does not have active suboxone script. Pt notified and is verbally aggressive. Sitter at bedside. Pt still reporting suicidal thoughts. No other needs at this time.

## 2020-08-14 NOTE — TOC Progression Note (Signed)
Transition of Care [facility]) - Progression Note    Patient Details  Name: Brian [name] MRN: [id number] Date of Birth: Apr 29, 1986  Transition of Care [facility]) CM/SW Contact  Joaquin Courts, RN Phone Number: 08/14/2020, 1:25 PM  Clinical Narrative:  IVC paperwork completed.  Patient has been IVC and served by [employer].      [facility] Dis[facility] Plan: [facility] Barriers to Discharge: Continued Medical Work up  Expected Discharge Plan and Services Expected Discharge Plan: [place] arrangements for the past 2 months: Homeless                                       Social Determinants of Health (SDOH) Interventions    Readmission Risk Interventions No flowsheet data found.

## 2020-08-14 NOTE — Consult Note (Addendum)
[facility] Face to Face Consultation   Reason for Consult:  Suicide attempt Referring Physician:  Letta Median Location of Patient: [facility] Location of Provider: Other: [facility]  Patient Identification: Brian [name] MRN:  [id number] Principal Diagnosis: Bupropion overdose Diagnosis:  Principal Problem:   Bupropion overdose Active Problems:   Substance use disorder   Suicidal ideation   Total Time spent with patient: 30 minutes  Subjective:  "Im struggling with addiction. I went to [facility] the other day they aint do shit. I took 8 Remeron when I got back, it knocked me out. SO when I woke up I took the Bupropion in hopes that it would finish me off. I notice when I took two I get an upper feeling or a high feeling. I took 2 and it sped me up. SO then I took 6 bupropion. I thought I could get f**ked up and blow my heart up. I don't know what it is going to take to kill me. I keep trying but it is not working. I woke up in the woods.    HPI:  Brian [name] is a 34 y.o. male with medical history significant for polysubstance use (cocaine, alcohol, heroin, methamphetamine, tobacco), anxiety/depression, peptic ulcers who presented to [facility] long ED after reported intentional overdose of bupropion.  Patient says he has been having suicidal ideation and has had uncontrolled depression recently.  He says he took 6 tablets of Wellbutrin 300 mg earlier today (08/13/2020) with intent to harm himself.  He says shortly afterward he found himself in the woods with multiple scratch type marks all over his upper and lower extremities.  He thinks he might have been running through briars.  He says he was having blurry vision.  He otherwise denies any subjective fevers, chills, diaphoresis, chest pain, palpitations, dyspnea, cough, abdominal pain, nausea, vomiting, or diarrhea.  He reports a history of polysubstance abuse, mainly methamphetamine and relapsed several weeks ago.  He says he has not used any  methamphetamine, cocaine, IV drugs in the last few days.  He does report occasional marijuana use.  He reports drinking a pint of beer daily.  Of note, patient was recently admitted 07/15/2020-07/30/2020 at [facility] for aspiration pneumonia.  He was treated with IV Vanco/cefepime before transition to Levaquin.  He was also noted to have gastritis on upper endoscopy treated with PPI.  He was discharged to the alcohol and drug abuse treatment Center in [place], [place].   During the evaluation patient was very irritable, and showing signs of mood instability and mood swings. He was very blunt and matter fact about his use of drugs and seeking help. He appears to be very familiar with the system and insists on continue to attempt suicide until he is successful.   This patient has a significant history of malingering, homelessness, polysubstance abuse, substance induced mood disorder, and depression.  Patient recently presented to [facility] 4 days ago with complaints of suicidal ideations, he was psychiatrically cleared. Later that night he reports taking (8) 85m Remeron in an attempt. He then reports taking (6) 3047mof Bupropion yesterday " to finish myself off and try to hurt myself. I know that when I take two I get a feeling like when I am an on uppers. So I guess you could say I am abusing the Wellbutrin. " He does endorse both of these as suicide attempts, even though he identifies misuse of the Wellbutrin for "high feeling".  This patient has a history of multiple  suicide attempts, suicide threats and gestures, which usually improves once housing is obtained or arranged for him. 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 He was arrested for selling dope 9 months ago. " He has had multiple inpatient psychiatric admissions and multiple substance abuse treatment facilities most recently last month. Considering his most  recent attempt will recommend inpatient at this time.    He continues to endorse suicidal ideations and suicidal behaviors at this time.    Past Psychiatric History: See above  Risk to Self:  No Risk to Others:  No Prior Inpatient Therapy:  Yes Prior Outpatient Therapy:  Yes  Past Medical History:  Past Medical History:  Diagnosis Date  . Alcohol abuse   . Anxiety   . Cocaine abuse ([place])   . Depression   . Kidney stones   . Narcotic abuse ([place])   . Peptic ulcer   . Polysubstance abuse ([place])   . Renal disorder    kidney stones    Past Surgical History:  Procedure Laterality Date  . ESOPHAGOGASTRODUODENOSCOPY N/A 05/29/2014   Procedure: ESOPHAGOGASTRODUODENOSCOPY (EGD) ;  Surgeon: Beryle Beams, MD;  Location: [facility] ENDOSCOPY;  Service: Endoscopy;  Laterality: N/A;  check with Dr. Collene Mares about sedation type/timinng - I recommend MAC  . HAND SURGERY  2006  . LEFT HEART CATH AND CORONARY ANGIOGRAPHY N/A 06/21/2020   Procedure: LEFT HEART CATH AND CORONARY ANGIOGRAPHY;  Surgeon: Nelva Bush, MD;  Location: [facility];  Service: Cardiovascular;  Laterality: N/A;   Family History:  Family History  Problem Relation Age of Onset  . Heart disease Father    Family Psychiatric  History: None reported Social History:  Social History   Substance and Sexual Activity  Alcohol Use Yes     Social History   Substance and Sexual Activity  Drug Use Not Currently  . Types: Marijuana, Cocaine, IV, Methamphetamines   Comment: heroin and meth    Social History   Socioeconomic History  . Marital status: Single    Spouse name: Not on file  . Number of children: Not on file  . Years of education: Not on file  . Highest education level: Not on file  Occupational History  . Not on file  Tobacco Use  . Smoking status: Current Every Day Smoker    Packs/day: 0.50    Types: Cigarettes  . Smokeless tobacco: Never Used  Vaping Use  . Vaping Use: Never used  Substance and  Sexual Activity  . Alcohol use: Yes  . Drug use: Not Currently    Types: Marijuana, Cocaine, IV, Methamphetamines    Comment: heroin and meth  . Sexual activity: Not on file  Other Topics Concern  . Not on file  Social History Narrative   Lives with a friend   Lost house and custody of son 9broke up with fiancee)   Nature conservation officer   Social Determinants of Health   Financial Resource Strain:   . Difficulty of Paying Living Expenses: Not on file  Food Insecurity:   . Worried About Charity fundraiser in the Last Year: Not on file  . Ran Out of Food in the Last Year: Not on file  Transportation Needs:   . Lack of Transportation (Medical): Not on file  . Lack of Transportation (Non-Medical): Not on file  Physical Activity:   . Days of Exercise  per Week: Not on file  . Minutes of Exercise per Session: Not on file  Stress:   . Feeling of Stress : Not on file  Social Connections:   . Frequency of Communication with Friends and Family: Not on file  . Frequency of Social Gatherings with Friends and Family: Not on file  . Attends Religious Services: Not on file  . Active Member of Clubs or Organizations: Not on file  . Attends Archivist Meetings: Not on file  . Marital Status: Not on file   Additional Social History:    Allergies:   Allergies  Allergen Reactions  . Fish Allergy Anaphylaxis  . Shellfish Allergy Anaphylaxis    Labs:  Results for orders placed or performed during the hospital encounter of 08/13/20 (from the past 48 hour(s))  Comprehensive metabolic panel     Status: Abnormal   Collection Time: 08/13/20  4:50 PM  Result Value Ref Range   Sodium 139 135 - 145 mmol/L   Potassium 4.2 3.5 - 5.1 mmol/L   Chloride 104 98 - 111 mmol/L   CO2 25 22 - 32 mmol/L   Glucose, Bld 98 70 - 99 mg/dL    Comment: Glucose reference range applies only to samples taken after fasting for at least 8 hours.   BUN 10 6 - 20 mg/dL   Creatinine, Ser 1.24 0.61 - 1.24  mg/dL   Calcium 9.3 8.9 - 10.3 mg/dL   Total Protein 8.5 (H) 6.5 - 8.1 g/dL   Albumin 4.5 3.5 - 5.0 g/dL   AST 45 (H) 15 - 41 U/L   ALT 42 0 - 44 U/L   Alkaline Phosphatase 95 38 - 126 U/L   Total Bilirubin 0.6 0.3 - 1.2 mg/dL   GFR, Estimated >60 >60 mL/min   Anion gap 10 5 - 15    Comment: Performed at [facility], [place] [address], [place], [place] [address]  Ethanol     Status: None   Collection Time: 08/13/20  4:50 PM  Result Value Ref Range   Alcohol, Ethyl (B) <10 <10 mg/dL    Comment: (NOTE) Lowest detectable limit for serum alcohol is 10 mg/dL.  For medical purposes only. Performed at [facility], [place] [address] [place], [place] [address]   Salicylate level     Status: Abnormal   Collection Time: 08/13/20  4:50 PM  Result Value Ref Range   Salicylate Lvl <8.8 (L) 7.0 - 30.0 mg/dL    Comment: Performed at [facility], [place] [address], [place], [place] [address]  Acetaminophen level     Status: Abnormal   Collection Time: 08/13/20  4:50 PM  Result Value Ref Range   Acetaminophen (Tylenol), Serum <10 (L) 10 - 30 ug/mL    Comment: (NOTE) Therapeutic concentrations vary significantly. A range of 10-30 ug/mL  may be an effective concentration for many patients. However, some  are best treated at concentrations outside of this range. Acetaminophen concentrations >150 ug/mL at 4 hours after ingestion  and >50 ug/mL at 12 hours after ingestion are often associated with  toxic reactions.  Performed at [facility], [place] [address] [place],  [address]   cbc     Status: Abnormal   Collection Time: 08/13/20  4:50 PM  Result Value Ref Range   WBC 13.3 (H) 4.0 - 10.5 K/uL   RBC 4.34 4.22 - 5.81 MIL/uL   Hemoglobin 13.1 13.0 - 17.0 g/dL  HCT 39.8 39 - 52 %   MCV 91.7 80.0 - 100.0 fL   MCH 30.2 26.0 - 34.0 pg   MCHC 32.9 30.0 - 36.0 g/dL   RDW 12.6 11.5 - 15.5 %   Platelets 250 150 - 400  K/uL   nRBC 0.0 0.0 - 0.2 %    Comment: Performed at [facility], [place] [address] [place], [place] [address]  Rapid urine drug screen (hospital performed)     Status: Abnormal   Collection Time: 08/13/20  4:50 PM  Result Value Ref Range   Opiates NONE DETECTED NONE DETECTED   Cocaine NONE DETECTED NONE DETECTED   Benzodiazepines NONE DETECTED NONE DETECTED   Amphetamines NONE DETECTED NONE DETECTED   Tetrahydrocannabinol POSITIVE (A) NONE DETECTED   Barbiturates NONE DETECTED NONE DETECTED    Comment: (NOTE) DRUG SCREEN FOR MEDICAL PURPOSES ONLY.  IF CONFIRMATION IS NEEDED FOR ANY PURPOSE, NOTIFY LAB WITHIN 5 DAYS.  LOWEST DETECTABLE LIMITS FOR URINE DRUG SCREEN Drug Class                     Cutoff (ng/mL) Amphetamine and metabolites    1000 Barbiturate and metabolites    200 Benzodiazepine                 056 Tricyclics and metabolites     300 Opiates and metabolites        300 Cocaine and metabolites        300 THC                            50 Performed at [facility], [place] [address], [place], [place] [address]   CBG monitoring, ED     Status: None   Collection Time: 08/13/20  4:52 PM  Result Value Ref Range   Glucose-Capillary 90 70 - 99 mg/dL    Comment: Glucose reference range applies only to samples taken after fasting for at least 8 hours.  Resp Panel by RT PCR (RSV, Flu A&B, Covid) - Nasopharyngeal Swab     Status: None   Collection Time: 08/13/20  8:09 PM   Specimen: Nasopharyngeal Swab  Result Value Ref Range   SARS Coronavirus 2 by RT PCR NEGATIVE NEGATIVE    Comment: (NOTE) SARS-CoV-2 target nucleic acids are NOT DETECTED.  The SARS-CoV-2 RNA is generally detectable in upper respiratoy specimens during the acute phase of infection. The lowest concentration of SARS-CoV-2 viral copies this assay can detect is 131 copies/mL. A negative result does not preclude SARS-Cov-2 infection and should not be used as the sole  basis for treatment or other patient management decisions. A negative result may occur with  improper specimen collection/handling, submission of specimen other than nasopharyngeal swab, presence of viral mutation(s) within the areas targeted by this assay, and inadequate number of viral copies (<131 copies/mL). A negative result must be combined with clinical observations, patient history, and epidemiological information. The expected result is Negative.  Fact Sheet for Patients:  PinkCheek.be  Fact Sheet for Healthcare Providers:  GravelBags.it  This test is no t yet approved or cleared by the [place] FDA and  has been authorized for detection and/or diagnosis of SARS-CoV-2 by FDA under an Emergency Use Authorization (EUA). This EUA will remain  in effect (meaning this test can be used) for the duration of the COVID-19 declaration under Section 564(b)(1) of the Act, 21 U.S.C. section 360bbb-3(b)(1),  unless the authorization is terminated or revoked sooner.     Influenza A by PCR NEGATIVE NEGATIVE   Influenza B by PCR NEGATIVE NEGATIVE    Comment: (NOTE) The Xpert Xpress SARS-CoV-2/FLU/RSV assay is intended as an aid in  the diagnosis of influenza from Nasopharyngeal swab specimens and  should not be used as a sole basis for treatment. Nasal washings and  aspirates are unacceptable for Xpert Xpress SARS-CoV-2/FLU/RSV  testing.  Fact Sheet for Patients: PinkCheek.be  Fact Sheet for Healthcare Providers: GravelBags.it  This test is not yet approved or cleared by the [place] FDA and  has been authorized for detection and/or diagnosis of SARS-CoV-2 by  FDA under an Emergency Use Authorization (EUA). This EUA will remain  in effect (meaning this test can be used) for the duration of the  Covid-19 declaration under Section 564(b)(1) of the Act, 21   U.S.C. section 360bbb-3(b)(1), unless the authorization is  terminated or revoked.    Respiratory Syncytial Virus by PCR NEGATIVE NEGATIVE    Comment: (NOTE) Fact Sheet for Patients: PinkCheek.be  Fact Sheet for Healthcare Providers: GravelBags.it  This test is not yet approved or cleared by the [place] FDA and  has been authorized for detection and/or diagnosis of SARS-CoV-2 by  FDA under an Emergency Use Authorization (EUA). This EUA will remain  in effect (meaning this test can be used) for the duration of the  COVID-19 declaration under Section 564(b)(1) of the Act, 21 U.S.C.  section 360bbb-3(b)(1), unless the authorization is terminated or  revoked. Performed at [facility], [place] [address] [place], [place] [address]   Comprehensive metabolic panel     Status: Abnormal   Collection Time: 08/14/20  4:19 AM  Result Value Ref Range   Sodium 138 135 - 145 mmol/L   Potassium 3.6 3.5 - 5.1 mmol/L   Chloride 105 98 - 111 mmol/L   CO2 23 22 - 32 mmol/L   Glucose, Bld 90 70 - 99 mg/dL    Comment: Glucose reference range applies only to samples taken after fasting for at least 8 hours.   BUN 10 6 - 20 mg/dL   Creatinine, Ser 1.19 0.61 - 1.24 mg/dL   Calcium 8.8 (L) 8.9 - 10.3 mg/dL   Total Protein 7.2 6.5 - 8.1 g/dL   Albumin 3.8 3.5 - 5.0 g/dL   AST 34 15 - 41 U/L   ALT 35 0 - 44 U/L   Alkaline Phosphatase 76 38 - 126 U/L   Total Bilirubin 0.7 0.3 - 1.2 mg/dL   GFR, Estimated >60 >60 mL/min   Anion gap 10 5 - 15    Comment: Performed at [facility], [place] [address], [place], [place] [address]  CBC     Status: Abnormal   Collection Time: 08/14/20  4:19 AM  Result Value Ref Range   WBC 8.1 4.0 - 10.5 K/uL   RBC 3.87 (L) 4.22 - 5.81 MIL/uL   Hemoglobin 11.7 (L) 13.0 - 17.0 g/dL   HCT 35.7 (L) 39 - 52 %   MCV 92.2 80.0 - 100.0 fL   MCH 30.2 26.0 - 34.0 pg   MCHC 32.8  30.0 - 36.0 g/dL   RDW 12.7 11.5 - 15.5 %   Platelets 206 150 - 400 K/uL   nRBC 0.0 0.0 - 0.2 %    Comment: Performed at [facility], [place] [address] [place], [place] [address]    Medications:  Current Facility-Administered  Medications  Medication Dose Route Frequency Provider Last Rate Last Admin  . amoxicillin (AMOXIL) capsule 500 mg  500 mg Oral TID Caren Griffins, MD   500 mg at 08/14/20 1006  . aspirin chewable tablet 81 mg  81 mg Oral Daily Caren Griffins, MD   81 mg at 08/14/20 1006  . atorvastatin (LIPITOR) tablet 80 mg  80 mg Oral QHS Gherghe, Costin M, MD      . buprenorphine-naloxone (SUBOXONE) 2-0.5 mg per SL tablet 1 tablet  1 tablet Sublingual Daily Gherghe, Costin M, MD      . enoxaparin (LOVENOX) injection 40 mg  40 mg Subcutaneous Q24H Zada Finders R, MD   40 mg at 08/14/20 0041  . hydrOXYzine (ATARAX/VISTARIL) tablet 10 mg  10 mg Oral TID PRN Caren Griffins, MD      . mirtazapine (REMERON) tablet 30 mg  30 mg Oral QHS Caren Griffins, MD      . multivitamin with minerals tablet 1 tablet  1 tablet Oral Daily Caren Griffins, MD   1 tablet at 08/14/20 1006  . OLANZapine (ZYPREXA) tablet 10 mg  10 mg Oral QHS Gherghe, Costin M, MD      . pantoprazole (PROTONIX) EC tablet 40 mg  40 mg Oral BID Caren Griffins, MD   40 mg at 08/14/20 1006  . sodium chloride flush (NS) 0.9 % injection 3 mL  3 mL Intravenous Q12H Zada Finders R, MD      . thiamine tablet 100 mg  100 mg Oral Daily Khatri, Hina, PA-C   100 mg at 08/14/20 1006   Or  . thiamine (B-1) injection 100 mg  100 mg Intravenous Daily Khatri, Hina, PA-C        Musculoskeletal: Strength & Muscle Tone: within normal limits Gait & Station: normal Patient leans: N/A  Psychiatric Specialty Exam: Physical Exam Vitals and nursing note reviewed. Exam conducted with a chaperone present.  Constitutional:      Appearance: Normal appearance.  Pulmonary:     Effort: Pulmonary effort is  normal.  Musculoskeletal:        General: Normal range of motion.  Neurological:     Mental Status: He is alert.  Psychiatric:        Attention and Perception: Attention and perception normal.        Mood and Affect: Mood and affect normal.        Speech: Speech normal.        Behavior: Behavior normal. Behavior is cooperative.        Thought Content: Paranoid: Reporting paranoia; but was not presenting as paranoia. Thought content does not include homicidal or suicidal ideation.        Cognition and Memory: Cognition and memory normal.        Judgment: Judgment is impulsive.     Review of Systems  Psychiatric/Behavioral: Positive for agitation, behavioral problems and suicidal ideas. Negative for decreased concentration, hallucinations, self-injury and sleep disturbance. Nervous/anxious: Stable.   All other systems reviewed and are negative.   Blood pressure 116/82, pulse 62, temperature 97.9 F (36.6 C), temperature source Oral, resp. rate 20, height _0  (1.727 m), weight 75 kg, SpO2 99 %.Body mass index is 25.13 kg/m.  General Appearance: Casual  Eye Contact:  Good  Speech:  Clear and Coherent and Normal Rate  Volume:  Normal  Mood:  Angry and Irritable  Affect:  Blunt and Labile  Thought Process:  Coherent, Goal Directed, Linear  and Descriptions of Associations: Intact  Orientation:  Full (Time, Place, and Person)  Thought Content:  Denies psychosis but reporting paranoid thougts that everyone is out to get him  Suicidal Thoughts:  No  Homicidal Thoughts:  No  Memory:  Immediate;   Good Recent;   Good  Judgement:  Intact  Insight:  Present  Psychomotor Activity:  Normal  Concentration:  Concentration: Good and Attention Span: Good  Recall:  Good  Fund of Knowledge:  Good  Language:  Good  Akathisia:  No  Handed:  Right  AIMS (if indicated):     Assets:  Communication Skills Desire for Improvement  ADL's:  Intact  Cognition:  WNL  Sleep:      Treatment Plan  Summary: Daily contact with patient to assess and evaluate symptoms and progress in treatment, Medication management and Plan Recommend inpatient at this time due to recent suicide attempt. If patient attempts to leave recommend IVC due to problemts noted above.   Resume home medications, new orders placed for : Prozac 20 mg daily  Neurontin 300 mg Tid Remeron 7.5 mg Q hs for sleep  Recommend working closely with SW to facilitate inpatient admission.  Disposition: Recommend psychiatric Inpatient admission when medically cleared. IVC if warranted for continuous assessment and overnight observation  This service was provided via telemedicine using a 2-way, interactive audio and video technology.  Names of all persons participating in this telemedicine service and their role in this encounter. Name: Sheran Fava Role: NP  Name: Dr. Dwyane Dee Role: Psychiatrist  Name: Dellia Beckwith Role: Patient   Name: Dr. Letta Median Role: Informing of disposition and recommendations.      Suella Broad, FNP 08/14/2020 12:20 PM

## 2020-08-14 NOTE — Plan of Care (Signed)
  Problem: Education: Goal: Knowledge of General Education information will improve Description: Including pain rating scale, medication(s)/side effects and non-pharmacologic comfort measures Outcome: Progressing   Problem: Health Behavior/Discharge Planning: Goal: Ability to manage health-related needs will improve Outcome: Progressing   Problem: Clinical Measurements: Goal: Ability to maintain clinical measurements within normal limits will improve Outcome: Progressing   Problem: Nutrition: Goal: Adequate nutrition will be maintained Outcome: Progressing   Problem: Coping: Goal: Level of anxiety will decrease Outcome: Progressing   Problem: Safety: Goal: Ability to remain free from injury will improve Outcome: Progressing   

## 2020-08-14 NOTE — Progress Notes (Addendum)
Patient belongings include clothes, shoes, hat, a wallet containing $14 cash, 0.5 pack of cigarettes, 2 lighters, keys and a small flashlight. Belongings were placed in the suicide cabinet.  Pt also has 1 pair of small cross earrings in his ears.
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
## 2020-08-14 NOTE — TOC Progression Note (Addendum)
Transition of Care [facility]) - Progression Note    Patient Details  Name: Brian [name] MRN: [id number] Date of Birth: 11/15/1985  Transition of Care [facility]) CM/SW Contact  Joaquin Courts, RN Phone Number: 08/14/2020, 3:04 PM  Clinical Narrative:    CM reached out to [facility] and [facility].  [facility] is reviewing patient and [facility] is full.  Patient faxed out to other area psychiatric facilities including: [facility], [facility], [facility], [facility], [facility], [facility], [facility], [facility], [facility], and [facility].      Expected Discharge Plan: Psychiatric Hospital Barriers to Discharge: Continued Medical Work up  Expected Discharge Plan and Services Expected Discharge Plan: [place] arrangements for the past 2 months: Homeless                                       Social Determinants of Health (SDOH) Interventions    Readmission Risk Interventions No flowsheet data found.

## 2020-08-14 NOTE — Discharge Summary (Addendum)
Physician Discharge Summary  [name] HYQ:[id number] DOB: 1986-03-07 DOA: 08/13/2020  PCP: Beverly Sessions  Admit date: 08/13/2020 Discharge date: 08/14/2020  Admitted From: home  Disposition: Inpatient psychiatric facility  Recommendations for Outpatient Follow-up:  Patient to be discharged to inpatient psychiatric facility at the psychiatrist's recommendation, awaiting bed  Home Health: None Equipment/Devices: None  Discharge Condition: Stable CODE STATUS: Full code Diet recommendation: Regular  HPI: Per admitting MD, Brian [name] is a 34 y.o. male with medical history significant for polysubstance use (cocaine, alcohol, heroin, methamphetamine, tobacco), anxiety/depression, peptic ulcers who presented to [facility] long ED after reported intentional overdose of bupropion. Patient says he has been having suicidal ideation and has had uncontrolled depression recently.  He says he took 6 tablets of Wellbutrin 300 mg earlier today (08/13/2020) with intent to harm himself.  He says shortly afterward he found himself in the woods with multiple scratch type marks all over his upper and lower extremities.  He thinks he might have been running through briars.  He says he was having blurry vision.  He otherwise denies any subjective fevers, chills, diaphoresis, chest pain, palpitations, dyspnea, cough, abdominal pain, nausea, vomiting, or diarrhea. He reports a history of polysubstance abuse, mainly methamphetamine and relapsed several weeks ago.  He says he has not used any methamphetamine, cocaine, IV drugs in the last few days.  He does report occasional marijuana use.  He reports drinking a pint of beer daily. Of note, patient was recently admitted 07/15/2020-07/30/2020 at [facility] for aspiration pneumonia.  He was treated with IV Vanco/cefepime before transition to Levaquin.  He was also noted to have gastritis on upper endoscopy treated with PPI.  He was discharged to the alcohol and drug abuse  treatment Center in [place], [place].  Hospital Course / Discharge diagnoses: Intentional overdose of bupropion with suicidal ideation-patient reported to me that he took 6 tablets of 300 mg of Wellbutrin with the intent of hurting himself.  He has tried that before and had an ED visit a few days ago but he was not admitted, cleared by TTS, and he attempted again.  Poison control recommending observation, there were no events on telemetry/EKG, and they signed off on 10/19.  From a medical standpoint he is cleared to be discharged to psychiatric facility ASAP when bed available.  Polysubstance abuse-patient has a history of polysubstance abuse, methamphetamine, cocaine, alcohol, marijuana, with recent use of marijuana and alcohol.  I will defer further treatment for his polysubstance abuse to the psychiatry facility where he will be discharged to.  Of note, patient is very adamant that he was chronically on Subutex and he wants that continued here. He tells me that he was given a prescription at [facility] when he was discharged. I reviewed the dc summary and he was given Subutex for 3 days only. He was discharged to a rehabilitation center afterwards in [facility], I was able to call [facility], facility was initially resistant in giving me any information but I was eventually directed to the voicemail of the psychiatrist, left a message, no call back until the time of this note. His PDMP review shows that his last Subutex prescription was in February, for a short time then as well. Patient does admit to continuing methamphetamines, alcohol, marijuana and so far has not demonstrated a behavior consistent with desire not to abuse substances. He is here with overdose and is waiting placement. I do not see him as a good candidate for  chronic Subutex given current circumstances, lack of consistent follow up and ongoing substance abuse.  Discharge Instructions  Allergies as of  08/14/2020      Reactions   Fish Allergy Anaphylaxis   Shellfish Allergy Anaphylaxis      Medication List    STOP taking these medications   buprenorphine 2 MG Subl SL tablet Commonly known as: SUBUTEX   QUEtiapine 25 MG tablet Commonly known as: SEROQUEL     TAKE these medications   (feeding supplement) PROSource Plus liquid Take 30 mLs by mouth 2 (two) times daily between meals.   amoxicillin 500 MG capsule Commonly known as: AMOXIL Take 1 capsule (500 mg total) by mouth 3 (three) times daily.   aspirin 81 MG chewable tablet Chew 81 mg by mouth daily.   atorvastatin 80 MG tablet Commonly known as: LIPITOR Take 1 tablet (80 mg total) by mouth daily. What changed: when to take this   buPROPion 150 MG 24 hr tablet Commonly known as: WELLBUTRIN XL Take 1 tablet (150 mg total) by mouth daily.   colchicine 0.6 MG tablet Take 1 tablet (0.6 mg total) by mouth 2 (two) times daily.   mirtazapine 7.5 MG tablet Commonly known as: REMERON Take 1 tablet (7.5 mg total) by mouth at bedtime. What changed: how much to take   multivitamin with minerals Tabs tablet Take 1 tablet by mouth daily.   OLANZapine 10 MG tablet Commonly known as: ZYPREXA Take 10 mg by mouth at bedtime.   pantoprazole 40 MG tablet Commonly known as: PROTONIX Take 1 tablet (40 mg total) by mouth 2 (two) times daily.        Consultations:    Procedures/Studies:   No results found.   Subjective: - no chest pain, shortness of breath, no abdominal pain, nausea or vomiting.   Discharge Exam: BP 116/82 (BP Location: Left Arm)   Pulse 62   Temp 97.9 F (36.6 C) (Oral)   Resp 20   Ht 5\' 8"  (1.727 m)   Wt 75 kg   SpO2 99%   BMI 25.13 kg/m   General: Pt is alert, awake, agitated Cardiovascular: RRR, S1/S2 +, no rubs, no gallops Respiratory: CTA bilaterally, no wheezing, no rhonchi Abdominal: Soft, NT, ND, bowel sounds + Extremities: no edema, no cyanosis   The results of  significant diagnostics from this hospitalization (including imaging, microbiology, ancillary and laboratory) are listed below for reference.     Microbiology: Recent Results (from the past 240 hour(s))  Respiratory Panel by RT PCR (Flu A&B, Covid) - Nasopharyngeal Swab     Status: None   Collection Time: 08/09/20  7:23 AM   Specimen: Nasopharyngeal Swab  Result Value Ref Range Status   SARS Coronavirus 2 by RT PCR NEGATIVE NEGATIVE Final    Comment: (NOTE) SARS-CoV-2 target nucleic acids are NOT DETECTED.  The SARS-CoV-2 RNA is generally detectable in upper respiratoy specimens during the acute phase of infection. The lowest concentration of SARS-CoV-2 viral copies this assay can detect is 131 copies/mL. A negative result does not preclude SARS-Cov-2 infection and should not be used as the sole basis for treatment or other patient management decisions. A negative result may occur with  improper specimen collection/handling, submission of specimen other than nasopharyngeal swab, presence of viral mutation(s) within the areas targeted by this assay, and inadequate number of viral copies (<131 copies/mL). A negative result must be combined with clinical observations, patient history, and epidemiological information. The expected result is Negative.  Fact Sheet  for Patients:  PinkCheek.be  Fact Sheet for Healthcare Providers:  GravelBags.it  This test is no t yet approved or cleared by the [place] FDA and  has been authorized for detection and/or diagnosis of SARS-CoV-2 by FDA under an Emergency Use Authorization (EUA). This EUA will remain  in effect (meaning this test can be used) for the duration of the COVID-19 declaration under Section 564(b)(1) of the Act, 21 U.S.C. section 360bbb-3(b)(1), unless the authorization is terminated or revoked sooner.     Influenza A by PCR NEGATIVE NEGATIVE Final   Influenza B by  PCR NEGATIVE NEGATIVE Final    Comment: (NOTE) The Xpert Xpress SARS-CoV-2/FLU/RSV assay is intended as an aid in  the diagnosis of influenza from Nasopharyngeal swab specimens and  should not be used as a sole basis for treatment. Nasal washings and  aspirates are unacceptable for Xpert Xpress SARS-CoV-2/FLU/RSV  testing.  Fact Sheet for Patients: PinkCheek.be  Fact Sheet for Healthcare Providers: GravelBags.it  This test is not yet approved or cleared by the [place] FDA and  has been authorized for detection and/or diagnosis of SARS-CoV-2 by  FDA under an Emergency Use Authorization (EUA). This EUA will remain  in effect (meaning this test can be used) for the duration of the  Covid-19 declaration under Section 564(b)(1) of the Act, 21  U.S.C. section 360bbb-3(b)(1), unless the authorization is  terminated or revoked. Performed at [facility], [place] [address], [place], [place] [address]   Resp Panel by RT PCR (RSV, Flu A&B, Covid) - Nasopharyngeal Swab     Status: None   Collection Time: 08/13/20  8:09 PM   Specimen: Nasopharyngeal Swab  Result Value Ref Range Status   SARS Coronavirus 2 by RT PCR NEGATIVE NEGATIVE Final    Comment: (NOTE) SARS-CoV-2 target nucleic acids are NOT DETECTED.  The SARS-CoV-2 RNA is generally detectable in upper respiratoy specimens during the acute phase of infection. The lowest concentration of SARS-CoV-2 viral copies this assay can detect is 131 copies/mL. A negative result does not preclude SARS-Cov-2 infection and should not be used as the sole basis for treatment or other patient management decisions. A negative result may occur with  improper specimen collection/handling, submission of specimen other than nasopharyngeal swab, presence of viral mutation(s) within the areas targeted by this assay, and inadequate number of viral copies (<131 copies/mL). A negative result  must be combined with clinical observations, patient history, and epidemiological information. The expected result is Negative.  Fact Sheet for Patients:  PinkCheek.be  Fact Sheet for Healthcare Providers:  GravelBags.it  This test is no t yet approved or cleared by the [place] FDA and  has been authorized for detection and/or diagnosis of SARS-CoV-2 by FDA under an Emergency Use Authorization (EUA). This EUA will remain  in effect (meaning this test can be used) for the duration of the COVID-19 declaration under Section 564(b)(1) of the Act, 21 U.S.C. section 360bbb-3(b)(1), unless the authorization is terminated or revoked sooner.     Influenza A by PCR NEGATIVE NEGATIVE Final   Influenza B by PCR NEGATIVE NEGATIVE Final    Comment: (NOTE) The Xpert Xpress SARS-CoV-2/FLU/RSV assay is intended as an aid in  the diagnosis of influenza from Nasopharyngeal swab specimens and  should not be used as a sole basis for treatment. Nasal washings and  aspirates are unacceptable for Xpert Xpress SARS-CoV-2/FLU/RSV  testing.  Fact Sheet for Patients: PinkCheek.be  Fact Sheet for Healthcare Providers: GravelBags.it  This  test is not yet approved or cleared by the [place] and  has been authorized for detection and/or diagnosis of SARS-CoV-2 by  FDA under an Emergency Use Authorization (EUA). This EUA will remain  in effect (meaning this test can be used) for the duration of the  Covid-19 declaration under Section 564(b)(1) of the Act, 21  U.S.C. section 360bbb-3(b)(1), unless the authorization is  terminated or revoked.    Respiratory Syncytial Virus by PCR NEGATIVE NEGATIVE Final    Comment: (NOTE) Fact Sheet for Patients: PinkCheek.be  Fact Sheet for Healthcare Providers: GravelBags.it  This  test is not yet approved or cleared by the [place] FDA and  has been authorized for detection and/or diagnosis of SARS-CoV-2 by  FDA under an Emergency Use Authorization (EUA). This EUA will remain  in effect (meaning this test can be used) for the duration of the  COVID-19 declaration under Section 564(b)(1) of the Act, 21 U.S.C.  section 360bbb-3(b)(1), unless the authorization is terminated or  revoked. Performed at [facility], [place] [address], [place], [place] [address]      Labs: Basic Metabolic Panel: Recent Labs  Lab 08/09/20 2021 08/13/20 1650 08/14/20 0419  NA 136 139 138  K 3.8 4.2 3.6  CL 99 104 105  CO2 26 25 23   GLUCOSE 94 98 90  BUN 17 10 10   CREATININE 1.37* 1.24 1.19  CALCIUM 9.6 9.3 8.8*   Liver Function Tests: Recent Labs  Lab 08/09/20 2021 08/13/20 1650 08/14/20 0419  AST 42* 45* 34  ALT 41 42 35  ALKPHOS 89 95 76  BILITOT 0.6 0.6 0.7  PROT 8.0 8.5* 7.2  ALBUMIN 4.2 4.5 3.8   CBC: Recent Labs  Lab 08/09/20 2021 08/13/20 1650 08/14/20 0419  WBC 10.9* 13.3* 8.1  HGB 12.9* 13.1 11.7*  HCT 39.7 39.8 35.7*  MCV 93.2 91.7 92.2  PLT 282 250 206   CBG: Recent Labs  Lab 08/13/20 1652  GLUCAP 90   Hgb A1c No results for input(s): HGBA1C in the last 72 hours. Lipid Profile No results for input(s): CHOL, HDL, LDLCALC, TRIG, CHOLHDL, LDLDIRECT in the last 72 hours. Thyroid function studies No results for input(s): TSH, T4TOTAL, T3FREE, THYROIDAB in the last 72 hours.  Invalid input(s): FREET3 Urinalysis    Component Value Date/Time   COLORURINE YELLOW 07/01/2020 2255   APPEARANCEUR HAZY (A) 07/01/2020 2255   LABSPEC 1.028 07/01/2020 2255   PHURINE 5.0 07/01/2020 2255   GLUCOSEU NEGATIVE 07/01/2020 2255   HGBUR NEGATIVE 07/01/2020 2255   BILIRUBINUR NEGATIVE 07/01/2020 2255   KETONESUR 5 (A) 07/01/2020 2255   PROTEINUR >=300 (A) 07/01/2020 2255   UROBILINOGEN 0.2 05/21/2015 0116   NITRITE NEGATIVE  07/01/2020 2255   LEUKOCYTESUR NEGATIVE 07/01/2020 2255    FURTHER DISCHARGE INSTRUCTIONS:   Get Medicines reviewed and adjusted: Please take all your medications with you for your next visit with your Primary MD   Laboratory/radiological data: Please request your Primary MD to go over all hospital tests and procedure/radiological results at the follow up, please ask your Primary MD to get all Hospital records sent to his/her office.   In some cases, they will be blood work, cultures and biopsy results pending at the time of your discharge. Please request that your primary care M.D. goes through all the records of your hospital data and follows up on these results.   Also Note the following: If you experience worsening of your admission symptoms, develop shortness of  breath, life threatening emergency, suicidal or homicidal thoughts you must seek medical attention immediately by calling 911 or calling your MD immediately  if symptoms less severe.   You must read complete instructions/literature along with all the possible adverse reactions/side effects for all the Medicines you take and that have been prescribed to you. Take any new Medicines after you have completely understood and accpet all the possible adverse reactions/side effects.    Do not drive when taking Pain medications or sleeping medications (Benzodaizepines)   Do not take more than prescribed Pain, Sleep and Anxiety Medications. It is not advisable to combine anxiety,sleep and pain medications without talking with your primary care practitioner   Special Instructions: If you have smoked or chewed Tobacco  in the last 2 yrs please stop smoking, stop any regular Alcohol  and or any Recreational drug use.   Wear Seat belts while driving.   Please note: You were cared for by a hospitalist during your hospital stay. Once you are discharged, your primary care physician will handle any further medical issues. Please note that NO  REFILLS for any discharge medications will be authorized once you are discharged, as it is imperative that you return to your primary care physician (or establish a relationship with a primary care physician if you do not have one) for your post hospital discharge needs so that they can reassess your need for medications and monitor your lab values.  Time coordinating discharge: 50 minutes  SIGNED:  Marzetta Board, MD, PhD 08/14/2020, 11:33 AM

## 2020-08-15 ENCOUNTER — Observation Stay (HOSPITAL_COMMUNITY): Payer: Self-pay

## 2020-08-15 ENCOUNTER — Inpatient Hospital Stay: Admission: AD | Admit: 2020-08-15 | Payer: No Payment, Other | Source: Intra-hospital | Admitting: Psychiatry

## 2020-08-15 LAB — RESPIRATORY PANEL BY RT PCR (FLU A&B, COVID)
Influenza A by PCR: NEGATIVE
Influenza B by PCR: NEGATIVE
SARS Coronavirus 2 by RT PCR: NEGATIVE

## 2020-08-15 MED ORDER — ACETAMINOPHEN 500 MG PO TABS
1000.0000 mg | ORAL_TABLET | Freq: Three times a day (TID) | ORAL | Status: DC
  Administered 2020-08-15 – 2020-08-16 (×3): 1000 mg via ORAL
  Filled 2020-08-15 (×3): qty 2

## 2020-08-15 MED ORDER — KETOROLAC TROMETHAMINE 30 MG/ML IJ SOLN
30.0000 mg | Freq: Four times a day (QID) | INTRAMUSCULAR | Status: DC | PRN
  Administered 2020-08-16: 30 mg via INTRAVENOUS
  Filled 2020-08-15: qty 1

## 2020-08-15 MED ORDER — OXYCODONE HCL 5 MG PO TABS
5.0000 mg | ORAL_TABLET | ORAL | Status: DC | PRN
  Administered 2020-08-15: 5 mg via ORAL
  Filled 2020-08-15: qty 1

## 2020-08-15 MED ORDER — KETOROLAC TROMETHAMINE 15 MG/ML IJ SOLN
15.0000 mg | Freq: Four times a day (QID) | INTRAMUSCULAR | Status: DC | PRN
  Administered 2020-08-15: 15 mg via INTRAVENOUS
  Filled 2020-08-15: qty 1

## 2020-08-15 MED ORDER — KETOROLAC TROMETHAMINE 15 MG/ML IJ SOLN: 15.0000 mg | Freq: Once | INTRAMUSCULAR | Status: DC

## 2020-08-15 NOTE — Consult Note (Signed)
Since last note behavior seems to have escalated to make patient higher risk of violence than can be managed on our unit. Cancel acceptance at [facility] for now.

## 2020-08-15 NOTE — TOC Progression Note (Signed)
Transition of Care [facility]) - Progression Note    Patient Details  Name: Brian [name] MRN: [id number] Date of Birth: 12/12/1985  [facility] CM/SW Contact  Purcell Mouton, RN Phone Number: 08/15/2020, 10:29 AM  Clinical Narrative:    Asked [facility] and [facility] for bed. At present time there are no bed offers.    Expected Discharge Plan: Psychiatric Hospital Barriers to Discharge: Continued Medical Work up  Expected Discharge Plan and Services Expected Discharge Plan: [place] arrangements for the past 2 months: Homeless                                       Social Determinants of Health (SDOH) Interventions    Readmission Risk Interventions No flowsheet data found.

## 2020-08-15 NOTE — Consult Note (Signed)
  Psychiatry referral: Chart reviewed.  Case reviewed with TTS.  Patient may be tentatively scheduled for transfer to [facility] pending bed availability.  Orders will be placed.

## 2020-08-15 NOTE — Plan of Care (Signed)
  Problem: Education: Goal: Knowledge of General Education information will improve Description: Including pain rating scale, medication(s)/side effects and non-pharmacologic comfort measures Outcome: Progressing   Problem: Health Behavior/Discharge Planning: Goal: Ability to manage health-related needs will improve Outcome: Progressing   Problem: Clinical Measurements: Goal: Ability to maintain clinical measurements within normal limits will improve Outcome: Progressing   Problem: Nutrition: Goal: Adequate nutrition will be maintained Outcome: Progressing   Problem: Coping: Goal: Level of anxiety will decrease Outcome: Progressing   Problem: Safety: Goal: Ability to remain free from injury will improve Outcome: Progressing   

## 2020-08-15 NOTE — TOC Progression Note (Addendum)
Transition of Care [facility]) - Progression Note    Patient Details  Name: Brian [name] MRN: [id number] Date of Birth: 03-May-1986  [facility]) CM/SW Contact  Purcell Mouton, RN Phone Number: 08/15/2020, 3:58 PM  Clinical Narrative:     Pt was accepted at [facility] [place] for tomorrow morning. Pt will need COVID test, MD and RN are aware.  Expected Discharge Plan: Psychiatric Hospital Barriers to Discharge: Continued Medical Work up  Expected Discharge Plan and Services Expected Discharge Plan: [place] arrangements for the past 2 months: Homeless                                       Social Determinants of Health (SDOH) Interventions    Readmission Risk Interventions No flowsheet data found.

## 2020-08-15 NOTE — Progress Notes (Signed)
PROGRESS NOTE    Brian [name]  LOV:[id number] DOB: [birth year] DOA: 08/13/2020 PCP: Beverly Sessions (  Chief Complaint  Patient presents with  . Suicidal    Brief Narrative:  Brian [name] 34 y.o.malewith medical history significant forpolysubstance use (cocaine, alcohol, heroin, methamphetamine, tobacco), anxiety/depression, peptic ulcers who presented to [facility] long ED after reported intentional overdose of bupropion. Patient says he has been having suicidal ideation and has had uncontrolled depression recently. He says he took 6 tablets of Wellbutrin 300 mg earlier today (08/13/2020)with intent to harm himself. He says shortly afterward he found himself in the woods with multiple scratch type marks all over his upper and lower extremities. He thinks he might have been running through briars. He says he was having blurry vision. He otherwise denies any subjective fevers, chills, diaphoresis, chest pain, palpitations, dyspnea, cough, abdominal pain, nausea, vomiting, or diarrhea. He reports Brian [name] history of polysubstance abuse, mainly methamphetamine and relapsed several weeks ago. He says he has not used any methamphetamine, cocaine, IV drugs in the last few days. He does report occasional marijuana use. He reports drinking Brian [name] pint of beer daily. Of note, patient was recently admitted 07/15/2020-07/30/2020 at [facility] for aspiration pneumonia. He was treated with IV Vanco/cefepime before transition to Levaquin. He was also noted to have gastritis on upper endoscopy treated with PPI. He was discharged to the alcohol and drug abuse treatment Center in [place], [place].  Assessment & Plan:   Principal Problem:   Bupropion overdose Active Problems:   Substance use disorder   Suicidal ideation  Intentional overdose of bupropion with suicidal ideation-patient reported he took 6 tablets of 300 mg of Wellbutrin with the intent of hurting himself.  He has tried that before and had  an ED visit Brian [name] few days ago but he was not admitted, cleared by TTS, and he attempted again.  Poison control recommending observation, there were no events on telemetry/EKG, and they signed off on 10/19.  From [name] medical standpoint he is cleared to be discharged to psychiatric facility ASAP when bed available. Safety sitter  Polysubstance abuse-patient has [name] history of polysubstance abuse, methamphetamine, cocaine, alcohol, marijuana, with recent use of marijuana and alcohol.  I will defer further treatment for his polysubstance abuse to the psychiatry facility where he will be discharged to.  Left Hand Swelling- punched wall yesterday related to not receiving subutex.  Today with swelling of L hand.  Plain films without evidence of fx (discussed with radiology).   APAP, toradol, oxycodone prn pain for now  Agitation  Aggressive Behavior: see note from 10/19.  Today he's been cooperative.  He apologized to me for his behavior yesterday.   Will continue ativan prn for now as needed Haldol prn Of note, patient is very adamant that he was chronically on Subutex and he wants that continued here. He   DVT prophylaxis: lovenox Code Status: full Family Communication: none at bedside Disposition:   Status is: Observation  The patient remains OBS appropriate and will d/c before 2 midnights.  Dispo: The patient is from: Home              Anticipated d/c is to: inpatient psych              Anticipated d/c date is: 1 day              Patient currently is not medically stable to d/c.       Consultants:   psych  Procedures:  none  Antimicrobials:  Anti-infectives (From admission, onward)   Start     Dose/Rate Route Frequency Ordered Stop   08/14/20 1000  amoxicillin (AMOXIL) capsule 500 mg        500 mg Oral 3 times daily 08/14/20 0711 08/21/20 0959         Subjective: C/o L hand pain/swelling  Objective: Vitals:   08/14/20 0420 08/14/20 0718 08/14/20 1159 08/14/20 1912    BP: (!) 110/55 116/82 127/78 114/66  Pulse: 62 62 66 79  Resp: 16 20 14    Temp: 98.4 F (36.9 C) 97.9 F (36.6 C) 98 F (36.7 C) 98.5 F (36.9 C)  TempSrc: Oral Oral Oral Oral  SpO2: 97% 99% 98% 98%  Weight:      Height:        Intake/Output Summary (Last 24 hours) at 08/15/2020 1806 Last data filed at 08/15/2020 1056 Gross per 24 hour  Intake 357 ml  Output --  Net 357 ml   Filed Weights   08/13/20 1545 08/14/20 0015  Weight: 77.1 kg 75 kg    Examination:  General exam: Appears calm and comfortable  Respiratory system: Clear to auscultation. Respiratory effort normal. Cardiovascular system: S1 & S2 heard, RRR Gastrointestinal system: Abdomen is nondistended, soft and nontender Central nervous system: Alert and oriented. No focal neurological deficits. Extremities: swelling of L hand Skin: No rashes, lesions or ulcers Psychiatry: Judgement and insight appear normal. Mood & affect appropriate.     Data Reviewed: I have personally reviewed following labs and imaging studies  CBC: Recent Labs  Lab 08/09/20 2021 08/13/20 1650 08/14/20 0419  WBC 10.9* 13.3* 8.1  HGB 12.9* 13.1 11.7*  HCT 39.7 39.8 35.7*  MCV 93.2 91.7 92.2  PLT 282 250 751    Basic Metabolic Panel: Recent Labs  Lab 08/09/20 2021 08/13/20 1650 08/14/20 0419  NA 136 139 138  K 3.8 4.2 3.6  CL 99 104 105  CO2 26 25 23   GLUCOSE 94 98 90  BUN 17 10 10   CREATININE 1.37* 1.24 1.19  CALCIUM 9.6 9.3 8.8*    GFR: Estimated Creatinine Clearance: 83 mL/min (by C-G formula based on SCr of 1.19 mg/dL).  Liver Function Tests: Recent Labs  Lab 08/09/20 2021 08/13/20 1650 08/14/20 0419  AST 42* 45* 34  ALT 41 42 35  ALKPHOS 89 95 76  BILITOT 0.6 0.6 0.7  PROT 8.0 8.5* 7.2  ALBUMIN 4.2 4.5 3.8    CBG: Recent Labs  Lab 08/13/20 1652  GLUCAP 90     Recent Results (from the past 240 hour(s))  Respiratory Panel by RT PCR (Flu Druanne Bosques&B, Covid) - Nasopharyngeal Swab     Status: None    Collection Time: 08/09/20  7:23 AM   Specimen: Nasopharyngeal Swab  Result Value Ref Range Status   SARS Coronavirus 2 by RT PCR NEGATIVE NEGATIVE Final    Comment: (NOTE) SARS-CoV-2 target nucleic acids are NOT DETECTED.  The SARS-CoV-2 RNA is generally detectable in upper respiratoy specimens during the acute phase of infection. The lowest concentration of SARS-CoV-2 viral copies this assay can detect is 131 copies/mL. Annaleise Burger negative result does not preclude SARS-Cov-2 infection and should not be used as the sole basis for treatment or other patient management decisions. Jef Futch negative result may occur with  improper specimen collection/handling, submission of specimen other than nasopharyngeal swab, presence of viral mutation(s) within the areas targeted by this assay, and inadequate number of viral copies (<131 copies/mL). Amaani Guilbault negative result must  be combined with clinical observations, patient history, and epidemiological information. The expected result is Negative.  Fact Sheet for Patients:  PinkCheek.be  Fact Sheet for Healthcare Providers:  GravelBags.it  This test is no t yet approved or cleared by the [place] FDA and  has been authorized for detection and/or diagnosis of SARS-CoV-2 by FDA under an Emergency Use Authorization (EUA). This EUA will remain  in effect (meaning this test can be used) for the duration of the COVID-19 declaration under Section 564(b)(1) of the Act, 21 U.S.C. section 360bbb-3(b)(1), unless the authorization is terminated or revoked sooner.     Influenza Sheneka Schrom by PCR NEGATIVE NEGATIVE Final   Influenza B by PCR NEGATIVE NEGATIVE Final    Comment: (NOTE) The Xpert Xpress SARS-CoV-2/FLU/RSV assay is intended as an aid in  the diagnosis of influenza from Nasopharyngeal swab specimens and  should not be used as Deshonna Trnka sole basis for treatment. Nasal washings and  aspirates are unacceptable for Xpert  Xpress SARS-CoV-2/FLU/RSV  testing.  Fact Sheet for Patients: PinkCheek.be  Fact Sheet for Healthcare Providers: GravelBags.it  This test is not yet approved or cleared by the [place] FDA and  has been authorized for detection and/or diagnosis of SARS-CoV-2 by  FDA under an Emergency Use Authorization (EUA). This EUA will remain  in effect (meaning this test can be used) for the duration of the  Covid-19 declaration under Section 564(b)(1) of the Act, 21  U.S.C. section 360bbb-3(b)(1), unless the authorization is  terminated or revoked. Performed at [facility], [place] [address], [place], [place] [address]   Resp Panel by RT PCR (RSV, Flu Viren Lebeau&B, Covid) - Nasopharyngeal Swab     Status: None   Collection Time: 08/13/20  8:09 PM   Specimen: Nasopharyngeal Swab  Result Value Ref Range Status   SARS Coronavirus 2 by RT PCR NEGATIVE NEGATIVE Final    Comment: (NOTE) SARS-CoV-2 target nucleic acids are NOT DETECTED.  The SARS-CoV-2 RNA is generally detectable in upper respiratoy specimens during the acute phase of infection. The lowest concentration of SARS-CoV-2 viral copies this assay can detect is 131 copies/mL. Amiah Frohlich negative result does not preclude SARS-Cov-2 infection and should not be used as the sole basis for treatment or other patient management decisions. Malesha Suliman negative result may occur with  improper specimen collection/handling, submission of specimen other than nasopharyngeal swab, presence of viral mutation(s) within the areas targeted by this assay, and inadequate number of viral copies (<131 copies/mL). Tru Leopard negative result must be combined with clinical observations, patient history, and epidemiological information. The expected result is Negative.  Fact Sheet for Patients:  PinkCheek.be  Fact Sheet for Healthcare Providers:  GravelBags.it  This  test is no t yet approved or cleared by the [place] FDA and  has been authorized for detection and/or diagnosis of SARS-CoV-2 by FDA under an Emergency Use Authorization (EUA). This EUA will remain  in effect (meaning this test can be used) for the duration of the COVID-19 declaration under Section 564(b)(1) of the Act, 21 U.S.C. section 360bbb-3(b)(1), unless the authorization is terminated or revoked sooner.     Influenza Arafat Cocuzza by PCR NEGATIVE NEGATIVE Final   Influenza B by PCR NEGATIVE NEGATIVE Final    Comment: (NOTE) The Xpert Xpress SARS-CoV-2/FLU/RSV assay is intended as an aid in  the diagnosis of influenza from Nasopharyngeal swab specimens and  should not be used as Baraa Tubbs sole basis for treatment. Nasal washings and  aspirates are unacceptable for Xpert Xpress  SARS-CoV-2/FLU/RSV  testing.  Fact Sheet for Patients: PinkCheek.be  Fact Sheet for Healthcare Providers: GravelBags.it  This test is not yet approved or cleared by the [place] FDA and  has been authorized for detection and/or diagnosis of SARS-CoV-2 by  FDA under an Emergency Use Authorization (EUA). This EUA will remain  in effect (meaning this test can be used) for the duration of the  Covid-19 declaration under Section 564(b)(1) of the Act, 21  U.S.C. section 360bbb-3(b)(1), unless the authorization is  terminated or revoked.    Respiratory Syncytial Virus by PCR NEGATIVE NEGATIVE Final    Comment: (NOTE) Fact Sheet for Patients: PinkCheek.be  Fact Sheet for Healthcare Providers: GravelBags.it  This test is not yet approved or cleared by the [place] FDA and  has been authorized for detection and/or diagnosis of SARS-CoV-2 by  FDA under an Emergency Use Authorization (EUA). This EUA will remain  in effect (meaning this test can be used) for the duration of the  COVID-19 declaration  under Section 564(b)(1) of the Act, 21 U.S.C.  section 360bbb-3(b)(1), unless the authorization is terminated or  revoked. Performed at [facility], [place] [address], [place], [place] [address]          Radiology Studies: DG Hand 2 View Left  Result Date: 08/15/2020 CLINICAL DATA:  Pain after punching wall EXAM: LEFT HAND - 2 VIEW COMPARISON:  None. FINDINGS: Frontal and lateral views were obtained. No fracture or dislocation. Joint spaces appear normal. No erosive change. IMPRESSION: No fracture or dislocation.  No evident arthropathy. Electronically Signed   By: Lowella Grip III M.D.   On: 08/15/2020 15:23        Scheduled Meds: . acetaminophen  1,000 mg Oral Q8H  . amoxicillin  500 mg Oral TID  . aspirin  81 mg Oral Daily  . atorvastatin  80 mg Oral QHS  . enoxaparin (LOVENOX) injection  40 mg Subcutaneous Q24H  . gabapentin  300 mg Oral TID  . melatonin  3 mg Oral QHS  . mirtazapine  30 mg Oral QHS  . multivitamin with minerals  1 tablet Oral Daily  . OLANZapine  10 mg Oral QHS  . pantoprazole  40 mg Oral BID  . QUEtiapine  25 mg Oral BID  . sodium chloride flush  3 mL Intravenous Q12H  . thiamine  100 mg Oral Daily   Or  . thiamine  100 mg Intravenous Daily   Continuous Infusions:   LOS: 0 days    Time spent: over 30 min    Fayrene Helper, MD [facility]   To contact the attending provider between 7A-7P or the covering provider during after hours 7P-7A, please log into the web site [email or web address] and access using universal [place] password for that web site. If you do not have the password, please call the hospital operator.  08/15/2020, 6:06 PM

## 2020-08-16 NOTE — Plan of Care (Signed)
  Problem: Health Behavior/Discharge Planning: Goal: Ability to manage health-related needs will improve Outcome: Progressing   Problem: Clinical Measurements: Goal: Ability to maintain clinical measurements within normal limits will improve Outcome: Progressing   

## 2020-08-16 NOTE — Progress Notes (Signed)
Pt discharged to Real facility[facility] via [place]. Pt given Ativan prior to discharge

## 2020-08-16 NOTE — TOC Progression Note (Signed)
Transition of Care [facility]) - Progression Note    Patient Details  Name: RASHAWN RAYMAN MRN: [id number] Date of Birth: [birth year]  [facility]) CM/SW Contact  Purcell Mouton, RN Phone Number: 08/16/2020, 11:02 AM  Clinical Narrative:     Pt agreed with going to [facility], [place] transportation here to transport pt.   Expected Discharge Plan: Psychiatric Hospital Barriers to Discharge: Continued Medical Work up  Expected Discharge Plan and Services Expected Discharge Plan: [place] arrangements for the past 2 months: Homeless Expected Discharge Date: 08/16/20                                     Social Determinants of Health (SDOH) Interventions    Readmission Risk Interventions No flowsheet data found.

## 2020-08-16 NOTE — Plan of Care (Signed)
  Problem: Education: Goal: Knowledge of General Education information will improve Description: Including pain rating scale, medication(s)/side effects and non-pharmacologic comfort measures Outcome: Adequate for Discharge   Problem: Clinical Measurements: Goal: Ability to maintain clinical measurements within normal limits will improve 08/16/2020 1132 by Zadie Rhine, RN Outcome: Adequate for Discharge 08/16/2020 1132 by Zadie Rhine, RN Outcome: Progressing   Problem: Nutrition: Goal: Adequate nutrition will be maintained Outcome: Adequate for Discharge   Problem: Coping: Goal: Level of anxiety will decrease Outcome: Adequate for Discharge

## 2020-08-16 NOTE — TOC Progression Note (Signed)
Transition of Care [facility]) - Progression Note    Patient Details  Name: Brian [name] MRN: [id number] Date of Birth: 02/15/1986  Transition of Care [facility]) CM/SW Contact  Purcell Mouton, RN Phone Number: 08/16/2020, 9:11 AM  Clinical Narrative:    Pt was made aware that he was transferring to [facility] Pt is upset and asking for his cloths. Pt's friend Leafy Ro was called [phone] with no answer. Left VM for him to return call.     Expected Discharge Plan: Psychiatric Hospital Barriers to Discharge: Continued Medical Work up  Expected Discharge Plan and Services Expected Discharge Plan: [place] arrangements for the past 2 months: Homeless                                       Social Determinants of Health (SDOH) Interventions    Readmission Risk Interventions No flowsheet data found.

## 2020-08-16 NOTE — Discharge Summary (Signed)
Physician Discharge Summary  ARSLAN KIER XIP:[id number] DOB: [birth year] DOA: 08/13/2020  PCP: Beverly Sessions  Admit date: 08/13/2020 Discharge date: 08/16/2020  Time spent: 40 minutes  Recommendations for Outpatient Follow-up:  1. discharge to inpatient psych 2. Further medication management of psych meds per inpatient psych 3. Follow left hand swelling outpatient, can continue tylenol, ibuprofen as needed.  Can also elevate, ice.  If continued pain, swelling consider follow up with orthopedics and/or additional imaging  Discharge Diagnoses:  Principal Problem:   Bupropion overdose Active Problems:   Substance use disorder   Suicidal ideation   Discharge Condition: stable  Diet recommendation: heart healthy  Filed Weights   08/13/20 1545 08/14/20 0015  Weight: 77.1 kg 75 kg    History of present illness:  Brian [name] 34 y.o.malewith medical history significant forpolysubstance use (cocaine, alcohol, heroin, methamphetamine, tobacco), anxiety/depression, peptic ulcers who presented to [facility] long ED after reported intentional overdose of bupropion. Patient says he has been having suicidal ideation and has had uncontrolled depression recently. He says he took 6 tablets of Wellbutrin 300 mg earlier today (08/13/2020)with intent to harm himself. He says shortly afterward he found himself in the woods with multiple scratch type marks all over his upper and lower extremities. He thinks he might have been running through briars. He says he was having blurry vision. He otherwise denies any subjective fevers, chills, diaphoresis, chest pain, palpitations, dyspnea, cough, abdominal pain, nausea, vomiting, or diarrhea.He reports [name] history of polysubstance abuse, mainly methamphetamine and relapsed several weeks ago. He says he has not used any methamphetamine, cocaine, IV drugs in the last few days. He does report occasional marijuana use. He reports drinking [name] pint of  beer daily. Of note, patient was recently admitted 07/15/2020-07/30/2020 at [facility] for aspiration pneumonia. He was treated with IV Vanco/cefepime before transition to Levaquin. He was also noted to have gastritis on upper endoscopy treated with PPI. He was discharged to the alcohol and drug abuse treatment Center in [place], [place].  He was admitted after [name] suicide attempt.  He's currently stable for discharge to inpatient psych.  Hospitalization was complicated by aggressive behavior and him punching Jahzaria Vary wall, leading to L hand swelling, but no fx identified on plain films.  He'll need continued psychiatric care at [facility].    See below for additional details   Hospital Course:  Intentional overdose of bupropion with suicidal ideation-patient reported he took 6 tablets of 300 mg of Wellbutrin with the intent of hurting himself. He has tried that before and had an ED visit [name] few days ago but he was not admitted, cleared by TTS, and he attempted again. Poison control recommending observation, there were no events on telemetry/EKG, and they signed off on 10/19. From Danaiya Steadman medical standpoint he is cleared to be discharged to psychiatric facility ASAP when bed available. Safety sitter  Polysubstance abuse-patient has [name] history of polysubstance abuse, methamphetamine, cocaine, alcohol, marijuana, with recent use of marijuana and alcohol. I will defer further treatment for his polysubstance abuse to the psychiatry facility where he will be discharged to.  Left Hand Swelling- punched wall 10/19 related to not receiving subutex.  Today with swelling of L hand.  Plain films without evidence of fx (discussed with radiology).  Discussed with ortho who noted could splint, but patient was not interested in splint at this time - discussed need for follow up if it did not continue to improve.  Can continue APAP and ibuprofen prn.  If  continued pain/swelling consider orthopedic follow up and/or additional  imaging.      Agitation  Aggressive Behavior: see note from 10/19.  He was cooperative on 10/20 and apologized for his behavior.    Will continue ativan prn for now as needed Haldol prn  Of note, patient was adamant that he was chronically on Subutex and he wants that continued here.  This was unable to be verified.  He also did not appear to have opiate use disorder so this was not restarted here.  He's been more agreeable recently, but would defer this to inpatient psych.  See previous notes.  Procedures: none Consultations:  none  Discharge Exam: Vitals:   08/15/20 2252 08/16/20 0648  BP: 102/61 114/69  Pulse: (!) 56 (!) 51  Resp: 18 18  Temp: 97.8 F (36.6 C) 97.9 F (36.6 C)  SpO2: 98% 98%   No new complaints Wants [name] phone Pain is improved to L hand  General: No acute distress. Lungs: unlabored Neurological: Alert and oriented 3. Moves all extremities 4 with equal strength. Cranial nerves II through XII grossly intact. Skin: scattered abrasions Psych: agitated Extremities: swelling to L hand, mildly ttp over 4th and 5th MCP joints  Discharge Instructions   Discharge Instructions    Call MD for:  difficulty breathing, headache or visual disturbances   Complete by: As directed    Call MD for:  extreme fatigue   Complete by: As directed    Call MD for:  hives   Complete by: As directed    Call MD for:  persistant dizziness or light-headedness   Complete by: As directed    Call MD for:  persistant nausea and vomiting   Complete by: As directed    Call MD for:  redness, tenderness, or signs of infection (pain, swelling, redness, odor or green/yellow discharge around incision site)   Complete by: As directed    Call MD for:  severe uncontrolled pain   Complete by: As directed    Call MD for:  temperature >100.4   Complete by: As directed    Diet - low sodium heart healthy   Complete by: As directed    Discharge instructions   Complete by: As directed     You were seen for [name] suicide attempt, we're sending you to inpatient psychiatry for further management.  Your x ray of your hand was unremarkable.  Continue as needed tylenol and ibuprofen.  If you continue to have pain or worsening swelling, you should follow up with orthopedics or your PCP outpatient.  Return for new, recurrent, or worsening symptoms.  Please ask your PCP to request records from this hospitalization so they know what was done and what the next steps will be.   Increase activity slowly   Complete by: As directed      Allergies as of 08/16/2020      Reactions   Fish Allergy Anaphylaxis   Shellfish Allergy Anaphylaxis      Medication List    STOP taking these medications   amoxicillin 500 MG capsule Commonly known as: AMOXIL   buprenorphine 2 MG Subl SL tablet Commonly known as: SUBUTEX   QUEtiapine 25 MG tablet Commonly known as: SEROQUEL     TAKE these medications   (feeding supplement) PROSource Plus liquid Take 30 mLs by mouth 2 (two) times daily between meals.   aspirin 81 MG chewable tablet Chew 81 mg by mouth daily.   atorvastatin 80 MG tablet Commonly known as:  LIPITOR Take 1 tablet (80 mg total) by mouth daily. What changed: when to take this   buPROPion 150 MG 24 hr tablet Commonly known as: WELLBUTRIN XL Take 1 tablet (150 mg total) by mouth daily.   colchicine 0.6 MG tablet Take 1 tablet (0.6 mg total) by mouth 2 (two) times daily.   mirtazapine 7.5 MG tablet Commonly known as: REMERON Take 1 tablet (7.5 mg total) by mouth at bedtime. What changed: how much to take   multivitamin with minerals Tabs tablet Take 1 tablet by mouth daily.   OLANZapine 10 MG tablet Commonly known as: ZYPREXA Take 10 mg by mouth at bedtime.   pantoprazole 40 MG tablet Commonly known as: PROTONIX Take 1 tablet (40 mg total) by mouth 2 (two) times daily.      Allergies  Allergen Reactions  . Fish Allergy Anaphylaxis  . Shellfish Allergy  Anaphylaxis      The results of significant diagnostics from this hospitalization (including imaging, microbiology, ancillary and laboratory) are listed below for reference.    Significant Diagnostic Studies: DG Hand 2 View Left  Result Date: 08/15/2020 CLINICAL DATA:  Pain after punching wall EXAM: LEFT HAND - 2 VIEW COMPARISON:  None. FINDINGS: Frontal and lateral views were obtained. No fracture or dislocation. Joint spaces appear normal. No erosive change. IMPRESSION: No fracture or dislocation.  No evident arthropathy. Electronically Signed   By: Lowella Grip III M.D.   On: 08/15/2020 15:23    Microbiology: Recent Results (from the past 240 hour(s))  Respiratory Panel by RT PCR (Flu Shatavia Santor&B, Covid) - Nasopharyngeal Swab     Status: None   Collection Time: 08/09/20  7:23 AM   Specimen: Nasopharyngeal Swab  Result Value Ref Range Status   SARS Coronavirus 2 by RT PCR NEGATIVE NEGATIVE Final    Comment: (NOTE) SARS-CoV-2 target nucleic acids are NOT DETECTED.  The SARS-CoV-2 RNA is generally detectable in upper respiratoy specimens during the acute phase of infection. The lowest concentration of SARS-CoV-2 viral copies this assay can detect is 131 copies/mL. Tomara Youngberg negative result does not preclude SARS-Cov-2 infection and should not be used as the sole basis for treatment or other patient management decisions. Nollie Terlizzi negative result may occur with  improper specimen collection/handling, submission of specimen other than nasopharyngeal swab, presence of viral mutation(s) within the areas targeted by this assay, and inadequate number of viral copies (<131 copies/mL). Jeff Frieden negative result must be combined with clinical observations, patient history, and epidemiological information. The expected result is Negative.  Fact Sheet for Patients:  PinkCheek.be  Fact Sheet for Healthcare Providers:  GravelBags.it  This test is no t yet  approved or cleared by the [place] FDA and  has been authorized for detection and/or diagnosis of SARS-CoV-2 by FDA under an Emergency Use Authorization (EUA). This EUA will remain  in effect (meaning this test can be used) for the duration of the COVID-19 declaration under Section 564(b)(1) of the Act, 21 U.S.C. section 360bbb-3(b)(1), unless the authorization is terminated or revoked sooner.     Influenza Tawni Melkonian by PCR NEGATIVE NEGATIVE Final   Influenza B by PCR NEGATIVE NEGATIVE Final    Comment: (NOTE) The Xpert Xpress SARS-CoV-2/FLU/RSV assay is intended as an aid in  the diagnosis of influenza from Nasopharyngeal swab specimens and  should not be used as Armine Rizzolo sole basis for treatment. Nasal washings and  aspirates are unacceptable for Xpert Xpress SARS-CoV-2/FLU/RSV  testing.  Fact Sheet for Patients: PinkCheek.be  Fact Sheet for  Healthcare Providers: GravelBags.it  This test is not yet approved or cleared by the [place] and  has been authorized for detection and/or diagnosis of SARS-CoV-2 by  FDA under an Emergency Use Authorization (EUA). This EUA will remain  in effect (meaning this test can be used) for the duration of the  Covid-19 declaration under Section 564(b)(1) of the Act, 21  U.S.C. section 360bbb-3(b)(1), unless the authorization is  terminated or revoked. Performed at [facility], [place] [address] [place], [place] [address]   Resp Panel by RT PCR (RSV, Flu Clorine Swing&B, Covid) - Nasopharyngeal Swab     Status: None   Collection Time: 08/13/20  8:09 PM   Specimen: Nasopharyngeal Swab  Result Value Ref Range Status   SARS Coronavirus 2 by RT PCR NEGATIVE NEGATIVE Final    Comment: (NOTE) SARS-CoV-2 target nucleic acids are NOT DETECTED.  The SARS-CoV-2 RNA is generally detectable in upper respiratoy specimens during the acute phase of infection. The lowest concentration of SARS-CoV-2 viral  copies this assay can detect is 131 copies/mL. Willer Osorno negative result does not preclude SARS-Cov-2 infection and should not be used as the sole basis for treatment or other patient management decisions. Demara Lover negative result may occur with  improper specimen collection/handling, submission of specimen other than nasopharyngeal swab, presence of viral mutation(s) within the areas targeted by this assay, and inadequate number of viral copies (<131 copies/mL). Vasilis Luhman negative result must be combined with clinical observations, patient history, and epidemiological information. The expected result is Negative.  Fact Sheet for Patients:  PinkCheek.be  Fact Sheet for Healthcare Providers:  GravelBags.it  This test is no t yet approved or cleared by the [place] FDA and  has been authorized for detection and/or diagnosis of SARS-CoV-2 by FDA under an Emergency Use Authorization (EUA). This EUA will remain  in effect (meaning this test can be used) for the duration of the COVID-19 declaration under Section 564(b)(1) of the Act, 21 U.S.C. section 360bbb-3(b)(1), unless the authorization is terminated or revoked sooner.     Influenza Halvor Behrend by PCR NEGATIVE NEGATIVE Final   Influenza B by PCR NEGATIVE NEGATIVE Final    Comment: (NOTE) The Xpert Xpress SARS-CoV-2/FLU/RSV assay is intended as an aid in  the diagnosis of influenza from Nasopharyngeal swab specimens and  should not be used as Zakiya Sporrer sole basis for treatment. Nasal washings and  aspirates are unacceptable for Xpert Xpress SARS-CoV-2/FLU/RSV  testing.  Fact Sheet for Patients: PinkCheek.be  Fact Sheet for Healthcare Providers: GravelBags.it  This test is not yet approved or cleared by the [place] FDA and  has been authorized for detection and/or diagnosis of SARS-CoV-2 by  FDA under an Emergency Use Authorization (EUA). This  EUA will remain  in effect (meaning this test can be used) for the duration of the  Covid-19 declaration under Section 564(b)(1) of the Act, 21  U.S.C. section 360bbb-3(b)(1), unless the authorization is  terminated or revoked.    Respiratory Syncytial Virus by PCR NEGATIVE NEGATIVE Final    Comment: (NOTE) Fact Sheet for Patients: PinkCheek.be  Fact Sheet for Healthcare Providers: GravelBags.it  This test is not yet approved or cleared by the [place] FDA and  has been authorized for detection and/or diagnosis of SARS-CoV-2 by  FDA under an Emergency Use Authorization (EUA). This EUA will remain  in effect (meaning this test can be used) for the duration of the  COVID-19 declaration under Section 564(b)(1) of the Act, 21 U.S.C.  section 360bbb-3(b)(1), unless the authorization is terminated or  revoked. Performed at [facility], [place] [address] [place], [place] [address]   Respiratory Panel by RT PCR (Flu Lameeka Schleifer&B, Covid) - Nasopharyngeal Swab     Status: None   Collection Time: 08/15/20  4:38 PM   Specimen: Nasopharyngeal Swab  Result Value Ref Range Status   SARS Coronavirus 2 by RT PCR NEGATIVE NEGATIVE Final    Comment: (NOTE) SARS-CoV-2 target nucleic acids are NOT DETECTED.  The SARS-CoV-2 RNA is generally detectable in upper respiratoy specimens during the acute phase of infection. The lowest concentration of SARS-CoV-2 viral copies this assay can detect is 131 copies/mL. Charistopher Rumble negative result does not preclude SARS-Cov-2 infection and should not be used as the sole basis for treatment or other patient management decisions. Zya Finkle negative result may occur with  improper specimen collection/handling, submission of specimen other than nasopharyngeal swab, presence of viral mutation(s) within the areas targeted by this assay, and inadequate number of viral copies (<131 copies/mL). Britanee Vanblarcom negative result must  be combined with clinical observations, patient history, and epidemiological information. The expected result is Negative.  Fact Sheet for Patients:  PinkCheek.be  Fact Sheet for Healthcare Providers:  GravelBags.it  This test is no t yet approved or cleared by the [place] FDA and  has been authorized for detection and/or diagnosis of SARS-CoV-2 by FDA under an Emergency Use Authorization (EUA). This EUA will remain  in effect (meaning this test can be used) for the duration of the COVID-19 declaration under Section 564(b)(1) of the Act, 21 U.S.C. section 360bbb-3(b)(1), unless the authorization is terminated or revoked sooner.     Influenza Jayda White by PCR NEGATIVE NEGATIVE Final   Influenza B by PCR NEGATIVE NEGATIVE Final    Comment: (NOTE) The Xpert Xpress SARS-CoV-2/FLU/RSV assay is intended as an aid in  the diagnosis of influenza from Nasopharyngeal swab specimens and  should not be used as Lyda Colcord sole basis for treatment. Nasal washings and  aspirates are unacceptable for Xpert Xpress SARS-CoV-2/FLU/RSV  testing.  Fact Sheet for Patients: PinkCheek.be  Fact Sheet for Healthcare Providers: GravelBags.it  This test is not yet approved or cleared by the [place] FDA and  has been authorized for detection and/or diagnosis of SARS-CoV-2 by  FDA under an Emergency Use Authorization (EUA). This EUA will remain  in effect (meaning this test can be used) for the duration of the  Covid-19 declaration under Section 564(b)(1) of the Act, 21  U.S.C. section 360bbb-3(b)(1), unless the authorization is  terminated or revoked. Performed at [facility], [place] [address], [place], [place] [address]      Labs: Basic Metabolic Panel: Recent Labs  Lab 08/09/20 2021 08/13/20 1650 08/14/20 0419  NA 136 139 138  K 3.8 4.2 3.6  CL 99 104 105  CO2 26  25 23   GLUCOSE 94 98 90  BUN 17 10 10   CREATININE 1.37* 1.24 1.19  CALCIUM 9.6 9.3 8.8*   Liver Function Tests: Recent Labs  Lab 08/09/20 2021 08/13/20 1650 08/14/20 0419  AST 42* 45* 34  ALT 41 42 35  ALKPHOS 89 95 76  BILITOT 0.6 0.6 0.7  PROT 8.0 8.5* 7.2  ALBUMIN 4.2 4.5 3.8   No results for input(s): LIPASE, AMYLASE in the last 168 hours. No results for input(s): AMMONIA in the last 168 hours. CBC: Recent Labs  Lab 08/09/20 2021 08/13/20 1650 08/14/20 0419  WBC 10.9* 13.3* 8.1  HGB 12.9* 13.1  11.7*  HCT 39.7 39.8 35.7*  MCV 93.2 91.7 92.2  PLT 282 250 206   Cardiac Enzymes: No results for input(s): CKTOTAL, CKMB, CKMBINDEX, TROPONINI in the last 168 hours. BNP: BNP (last 3 results) No results for input(s): BNP in the last 8760 hours.  ProBNP (last 3 results) No results for input(s): PROBNP in the last 8760 hours.  CBG: Recent Labs  Lab 08/13/20 1652  GLUCAP 90       Signed:  Fayrene Helper MD.  [facility] 08/16/2020, 10:18 AM

## 2020-08-16 NOTE — Progress Notes (Signed)
Report called spoke to Brian [name], [place] discharged with [place], clothes given to patient. Pt had cigarettes and 2 lighters and flashlight. Instructions reviewed. SRP,RN

## 2020-10-08 ENCOUNTER — Emergency Department (HOSPITAL_COMMUNITY): Payer: Self-pay

## 2020-10-08 ENCOUNTER — Emergency Department (HOSPITAL_COMMUNITY)
Admission: EM | Admit: 2020-10-08 | Discharge: 2020-10-08 | Disposition: A | Discharge status: Home / Self Care | Payer: Self-pay | Attending: Emergency Medicine | Admitting: Emergency Medicine

## 2020-10-08 ENCOUNTER — Other Ambulatory Visit: Payer: Self-pay

## 2020-10-08 ENCOUNTER — Encounter (HOSPITAL_COMMUNITY): Payer: Self-pay

## 2020-10-08 DIAGNOSIS — R002 Palpitations: Secondary | ICD-10-CM | POA: Insufficient documentation

## 2020-10-08 DIAGNOSIS — R12 Heartburn: Secondary | ICD-10-CM | POA: Insufficient documentation

## 2020-10-08 DIAGNOSIS — Z13810 Encounter for screening for upper gastrointestinal disorder: Secondary | ICD-10-CM | POA: Insufficient documentation

## 2020-10-08 DIAGNOSIS — R000 Tachycardia, unspecified: Secondary | ICD-10-CM | POA: Insufficient documentation

## 2020-10-08 DIAGNOSIS — F17210 Nicotine dependence, cigarettes, uncomplicated: Secondary | ICD-10-CM | POA: Insufficient documentation

## 2020-10-08 DIAGNOSIS — Z9861 Coronary angioplasty status: Secondary | ICD-10-CM | POA: Insufficient documentation

## 2020-10-08 DIAGNOSIS — K2900 Acute gastritis without bleeding: Secondary | ICD-10-CM

## 2020-10-08 DIAGNOSIS — K2970 Gastritis, unspecified, without bleeding: Secondary | ICD-10-CM | POA: Insufficient documentation

## 2020-10-08 DIAGNOSIS — R0602 Shortness of breath: Secondary | ICD-10-CM | POA: Insufficient documentation

## 2020-10-08 DIAGNOSIS — R0789 Other chest pain: Secondary | ICD-10-CM

## 2020-10-08 HISTORY — DX: Bipolar II disorder: F31.81

## 2020-10-08 LAB — D-DIMER, QUANTITATIVE: D-Dimer, Quant: 0.27 ug/mL-FEU (ref 0.00–0.50)

## 2020-10-08 LAB — CBC
HCT: 44.2 % (ref 39.0–52.0)
Hemoglobin: 14.3 g/dL (ref 13.0–17.0)
MCH: 29.7 pg (ref 26.0–34.0)
MCHC: 32.4 g/dL (ref 30.0–36.0)
MCV: 91.7 fL (ref 80.0–100.0)
Platelets: 301 10*3/uL (ref 150–400)
RBC: 4.82 MIL/uL (ref 4.22–5.81)
RDW: 14.6 % (ref 11.5–15.5)
WBC: 8 10*3/uL (ref 4.0–10.5)
nRBC: 0 % (ref 0.0–0.2)

## 2020-10-08 LAB — COMPREHENSIVE METABOLIC PANEL
ALT: 30 U/L (ref 0–44)
AST: 25 U/L (ref 15–41)
Albumin: 4.5 g/dL (ref 3.5–5.0)
Alkaline Phosphatase: 67 U/L (ref 38–126)
Anion gap: 9 (ref 5–15)
BUN: 19 mg/dL (ref 6–20)
CO2: 23 mmol/L (ref 22–32)
Calcium: 9.4 mg/dL (ref 8.9–10.3)
Chloride: 107 mmol/L (ref 98–111)
Creatinine, Ser: 1.34 mg/dL — ABNORMAL HIGH (ref 0.61–1.24)
GFR, Estimated: >60 mL/min (ref >60)
Glucose, Bld: 108 mg/dL — ABNORMAL HIGH (ref 70–99)
Potassium: 4.2 mmol/L (ref 3.5–5.1)
Sodium: 139 mmol/L (ref 135–145)
Total Bilirubin: 0.4 mg/dL (ref 0.3–1.2)
Total Protein: 8.3 g/dL — ABNORMAL HIGH (ref 6.5–8.1)

## 2020-10-08 LAB — LIPASE, BLOOD: Lipase: 31 U/L (ref 11–51)

## 2020-10-08 LAB — TROPONIN I (HIGH SENSITIVITY)
Troponin I (High Sensitivity): 9 ng/L (ref <18)
Troponin I (High Sensitivity): <2 ng/L (ref <18)

## 2020-10-08 MED ORDER — SODIUM CHLORIDE 0.9 % IV BOLUS
1000.0000 mL | Freq: Once | INTRAVENOUS | Status: AC
  Administered 2020-10-08: 10:00:00 1000 mL via INTRAVENOUS

## 2020-10-08 MED ORDER — FAMOTIDINE IN NACL 20-0.9 MG/50ML-% IV SOLN
20.0000 mg | Freq: Once | INTRAVENOUS | Status: AC
  Administered 2020-10-08: 10:00:00 20 mg via INTRAVENOUS
  Filled 2020-10-08: qty 50

## 2020-10-08 MED ORDER — FAMOTIDINE 20 MG PO TABS: 20.0000 mg | ORAL_TABLET | Freq: Two times a day (BID) | ORAL | 0 refills | Status: DC

## 2020-10-08 NOTE — ED Provider Notes (Signed)
[facility] DEPT Provider Note   CSN: [id number] Arrival date & time: 10/08/20  4709     History Chief Complaint  Patient presents with  . Palpitations  . Heartburn    Brian [name] is a 34 y.o. male with a past medical history of polysubstance abuse, alcohol abuse with most recent use of meth and alcohol being about 1-1/2 months ago, PUD, prior NSTEMI, status post LHC showing no significant CAD not currently on DAPT presenting to the ED with a chief complaint of palpitations, shortness of breath and chest tightness.  Symptoms began last night and have been constant since this morning.  He has been compliant with his Protonix but states that this is not helping with the symptoms.  Pain is located in the middle of his torso.  Reports shortness of breath.  Denies any leg swelling.  Reports nausea but denies any vomiting.  Denies any fever, cough, urinary symptoms or hemoptysis.  No history of DVT or PE that he is aware of.  No recent immobilization.  Has not tried any other medications to help with his pain.  HPI     Past Medical History:  Diagnosis Date  . Alcohol abuse   . Anxiety   . Bipolar 2 disorder ([place])   . Cocaine abuse ([place])   . Depression   . Kidney stones   . Narcotic abuse ([place])   . Peptic ulcer   . Polysubstance abuse ([place])   . Renal disorder    kidney stones    Patient Active Problem List   Diagnosis Date Noted  . Bupropion overdose 08/13/2020  . Substance use disorder 08/13/2020  . Suicidal ideation 08/13/2020  . Pericarditis 07/02/2020  . Depression 07/02/2020  . Elevated troponin   . Non-ST elevation (NSTEMI) myocardial infarction ([place])   . Chest pain 06/19/2020  . Psychoactive substance-induced psychosis ([place]) 06/04/2020  . Malingering 06/04/2020  . Paranoia ([place])   . Amphetamine use disorder, severe, dependence ([place]) 03/26/2018  . Substance induced mood disorder ([place]) 03/26/2018  . Bipolar II disorder, severe,  depressed, with anxious distress ([place]) 03/25/2018  . Cellulitis 05/20/2015  . Dysuria 05/20/2015  . Smoker 05/20/2015  . Homeless single person 05/20/2015  . Drug abuse ([place])   . Opioid use disorder, severe, dependence ([place]) 03/08/2015  . Ileus of unspecified type ([place]) 05/28/2014  . Abdominal pain 05/28/2014  . AKI (acute kidney injury) ([place]) 05/28/2014  . Esophagitis, acute 05/28/2014  . Polysubstance abuse ([place]) 05/28/2014  . Hematemesis 05/28/2014    Past Surgical History:  Procedure Laterality Date  . ESOPHAGOGASTRODUODENOSCOPY N/A 05/29/2014   Procedure: ESOPHAGOGASTRODUODENOSCOPY (EGD) ;  Surgeon: Beryle Beams, MD;  Location: [facility] ENDOSCOPY;  Service: Endoscopy;  Laterality: N/A;  check with Dr. Collene Mares about sedation type/timinng - I recommend MAC  . HAND SURGERY  2006  . LEFT HEART CATH AND CORONARY ANGIOGRAPHY N/A 06/21/2020   Procedure: LEFT HEART CATH AND CORONARY ANGIOGRAPHY;  Surgeon: Nelva Bush, MD;  Location: [facility] CV LAB;  Service: Cardiovascular;  Laterality: N/A;       Family History  Problem Relation Age of Onset  . Heart disease Father     Social History   Tobacco Use  . Smoking status: Current Every Day Smoker    Packs/day: 0.50    Types: Cigarettes  . Smokeless tobacco: Never Used  Vaping Use  . Vaping Use: Never used  Substance Use Topics  . Alcohol use: Not Currently  . Drug use: Not  Currently    Types: Marijuana, Cocaine, IV, Methamphetamines    Comment: heroin and meth    Home Medications Prior to Admission medications   Medication Sig Start Date End Date Taking? Authorizing Provider  atorvastatin (LIPITOR) 80 MG tablet Take 1 tablet (80 mg total) by mouth daily. Patient taking differently: Take 80 mg by mouth at bedtime. 06/22/20 08/13/20 Yes Sanjuan Dame, MD  mirtazapine (REMERON) 7.5 MG tablet Take 1 tablet (7.5 mg total) by mouth at bedtime. Patient taking differently: Take 30 mg by mouth at bedtime. 06/04/20  Yes Rankin,  Shuvon B, NP  pantoprazole (PROTONIX) 40 MG tablet Take 1 tablet (40 mg total) by mouth 2 (two) times daily. 07/04/20 08/13/20 Yes Swayze, Ava, DO  QUEtiapine (SEROQUEL) 200 MG tablet Take 200 mg by mouth at bedtime. 09/18/20  Yes [provider]  buPROPion (WELLBUTRIN XL) 150 MG 24 hr tablet Take 1 tablet (150 mg total) by mouth daily. Patient not taking: Reported on 10/08/2020 06/05/20   Rankin, Shuvon B, NP  colchicine 0.6 MG tablet Take 1 tablet (0.6 mg total) by mouth 2 (two) times daily. Patient not taking: No sig reported 07/04/20 10/02/20  Swayze, Ava, DO  famotidine (PEPCID) 20 MG tablet Take 1 tablet (20 mg total) by mouth 2 (two) times daily. 10/08/20   Aribelle Mccosh, PA-C  Multiple Vitamin (MULTIVITAMIN WITH MINERALS) TABS tablet Take 1 tablet by mouth daily. Patient not taking: Reported on 10/08/2020 07/04/20   Swayze, Ava, DO  Nutritional Supplements (,FEEDING SUPPLEMENT, PROSOURCE PLUS) liquid Take 30 mLs by mouth 2 (two) times daily between meals. Patient not taking: No sig reported 07/04/20   Swayze, Ava, DO  FLUoxetine (PROZAC) 20 MG capsule Take 1 capsule (20 mg total) by mouth daily. For depression Patient not taking: Reported on 11/04/2019 04/01/18 05/04/20  Lindell Spar I, NP  gabapentin (NEURONTIN) 300 MG capsule Take 2 capsules (600 mg total) by mouth 3 (three) times daily. For agitation Patient not taking: Reported on 05/02/2020 03/31/18 05/04/20  Lindell Spar I, NP  sucralfate (CARAFATE) 1 g tablet Take 1 tablet (1 g total) by mouth 4 (four) times daily -  with meals and at bedtime. Patient not taking: Reported on 11/04/2019 08/30/19 05/04/20  Joy, Helane Gunther, PA-C  traZODone (DESYREL) 50 MG tablet Take 1 tablet (50 mg total) by mouth at bedtime as needed for sleep. Patient not taking: Reported on 08/30/2019 03/31/18 08/30/19  Lindell Spar I, NP    Allergies    Fish allergy and Shellfish allergy  Review of Systems   Review of Systems  Constitutional: Negative for appetite change, chills  and fever.  HENT: Negative for ear pain, rhinorrhea, sneezing and sore throat.   Eyes: Negative for photophobia and visual disturbance.  Respiratory: Positive for shortness of breath. Negative for cough, chest tightness and wheezing.   Cardiovascular: Positive for chest pain and palpitations.  Gastrointestinal: Positive for nausea. Negative for abdominal pain, blood in stool, constipation, diarrhea and vomiting.  Genitourinary: Negative for dysuria, hematuria and urgency.  Musculoskeletal: Negative for myalgias.  Skin: Negative for rash.  Neurological: Negative for dizziness, weakness and light-headedness.    Physical Exam Updated Vital Signs BP 130/73 (BP Location: Left Arm)   Pulse 99   Temp 98.9 F (37.2 C) (Oral)   Resp 18   Ht _0  (1.727 m)   Wt 81.6 kg   SpO2 97%   BMI 27.37 kg/m   Physical Exam Vitals and nursing note reviewed.  Constitutional:  General: He is not in acute distress.    Appearance: He is well-developed and well-nourished.     Comments: Speaking complete sentences without difficulty.  HENT:     Head: Normocephalic and atraumatic.     Nose: Nose normal.  Eyes:     General: No scleral icterus.       Right eye: No discharge.        Left eye: No discharge.     Extraocular Movements: EOM normal.     Conjunctiva/sclera: Conjunctivae normal.  Cardiovascular:     Rate and Rhythm: Regular rhythm. Tachycardia present.     Pulses: Intact distal pulses.     Heart sounds: Normal heart sounds. No murmur heard. No friction rub. No gallop.   Pulmonary:     Effort: Pulmonary effort is normal. No respiratory distress.     Breath sounds: Normal breath sounds.  Chest:     Chest wall: Tenderness present.    Abdominal:     General: Bowel sounds are normal. There is no distension.     Palpations: Abdomen is soft.     Tenderness: There is no abdominal tenderness. There is no guarding.  Musculoskeletal:        General: No edema. Normal range of motion.      Cervical back: Normal range of motion and neck supple.     Right lower leg: No edema.     Left lower leg: No edema.     Comments: No lower extremity edema, erythema or calf tenderness bilaterally.  Skin:    General: Skin is warm and dry.     Findings: No rash.  Neurological:     Mental Status: He is alert.     Motor: No abnormal muscle tone.     Coordination: Coordination normal.  Psychiatric:        Mood and Affect: Mood and affect normal.     ED Results / Procedures / Treatments   Labs (all labs ordered are listed, but only abnormal results are displayed) Labs Reviewed  COMPREHENSIVE METABOLIC PANEL - Abnormal; Notable for the following components:      Result Value   Glucose, Bld 108 (*)    Creatinine, Ser 1.34 (*)    Total Protein 8.3 (*)    All other components within normal limits  CBC  LIPASE, BLOOD  D-DIMER, QUANTITATIVE (NOT AT [facility])  TROPONIN I (HIGH SENSITIVITY)  TROPONIN I (HIGH SENSITIVITY)    EKG EKG Interpretation  Date/Time:  Monday October 08 2020 09:26:11 EST Ventricular Rate:  125 PR Interval:    QRS Duration: 82 QT Interval:  302 QTC Calculation: 436 R Axis:   104 Text Interpretation: Sinus tachycardia Consider right atrial enlargement Right axis deviation Baseline wander in lead(s) V6 Since prior ECG< rate has increased Confirmed by Gareth Morgan [phone]) on 10/08/2020 12:32:48 PM   Radiology DG Chest 2 View  Result Date: 10/08/2020 CLINICAL DATA:  Chest pain EXAM: CHEST - 2 VIEW COMPARISON:  July 01, 2020 FINDINGS: Lungs are clear. Heart size and pulmonary vascularity are normal. No adenopathy. There is mild degenerative change in the midthoracic spine. IMPRESSION: The lungs are clear.  Cardiac silhouette normal. Electronically Signed   By: Lowella Grip III M.D.   On: 10/08/2020 10:10    Procedures Procedures (including critical care time)  Medications Ordered in ED Medications  sodium chloride 0.9 % bolus 1,000 mL (0 mLs  Intravenous Stopped 10/08/20 1339)  famotidine (PEPCID) IVPB 20 mg premix (0 mg Intravenous Stopped  10/08/20 1050)    ED Course  I have reviewed the triage vital signs and the nursing notes.  Pertinent labs & imaging results that were available during my care of the patient were reviewed by me and considered in my medical decision making (see chart for details).  Clinical Course as of 10/08/20 1430  Mon Oct 08, 2020  1018 Pulse Rate(!): 115 Chart review shows that patient's heart rate typically in the 60s to 70s.  This could be due to his discomfort but with his tachycardia, chest pain and shortness of breath will add on a D-dimer. [HK]  1024 D-Dimer, Quant: 0.27 [HK]  1045 DG Chest 2 View No acute findings. [HK]  1112 Lipase: 31 [HK]  1112 Creatinine(!): 1.34 [HK]  1119 Troponin I (High Sensitivity): 9 [HK]  1212 Heart rate improved to 90s.  Patient continues to rest comfortably. [HK]  1212 Troponin I (High Sensitivity): <2 [HK]    Clinical Course User Index [HK] Delia Heady, PA-C   MDM Rules/Calculators/A&P                          34 year old male with past medical history of polysubstance abuse, alcohol abuse with recent use of meth and alcohol about 1 and half months ago, PUD, prior NSTEMI, status post LHC showing no significant CAD presenting to the ED with a chief complaint of palpitations, shortness of breath and chest tightness.  Symptoms began last night has been constant since this morning.  Compliant with Protonix but states that this is not helping the symptoms.  Pain is located middle of his torso.  He has tenderness in this area.  Denies any leg swelling.  Patient tachycardic to 115 on arrival.  Chart review shows that patient's heart rate typically in the 60s to 70s.  Due to his current symptoms concern for PE.  He is low risk due to his lack of other risk factors.  EKG shows sinus tachycardia.  No ischemic changes.  Chest x-ray is unremarkable.  CMP, CBC, lipase  unremarkable.  D-dimer is negative.  Initial and delta troponin both unremarkable.  Patient given IV fluids and Pepcid here with improvement in his symptoms.  His tachycardia has improved.  Suspect symptoms due to gastritis versus chest wall pain.  Doubt PE as his D-dimer is negative.  Due to his unremarkable EKG and troponins, doubt ACS as a cause of his symptoms specially considering his recent unremarkable left heart cath.  I feel that he will benefit from taking Pepcid outpatient.  Repeat abdominal exams are benign.  In the meantime encouraged him to follow-up with GI and cardiology.  He is requesting a refill of his Wellbutrin which she has been out for about 3 months.  He does have a history of overdose with Wellbutrin in a suicide attempt recently so I will defer this to outpatient psychiatry.  Return precautions given.   Patient is hemodynamically stable, in NAD, and able to ambulate in the ED. Evaluation does not show pathology that would require ongoing emergent intervention or inpatient treatment. I explained the diagnosis to the patient. Pain has been managed and has no complaints prior to discharge. Patient is comfortable with above plan and is stable for discharge at this time. All questions were answered prior to disposition. Strict return precautions for returning to the ED were discussed. Encouraged follow up with PCP.   An After Visit Summary was printed and given to the patient.   Portions  of this note were generated with [employer]. Dictation errors may occur despite best attempts at proofreading.  Final Clinical Impression(s) / ED Diagnoses Final diagnoses:  Acute gastritis without hemorrhage, unspecified gastritis type  Chest wall pain    Rx / DC Orders ED Discharge Orders         Ordered    famotidine (PEPCID) 20 MG tablet  2 times daily        10/08/20 1430           Delia Heady, PA-C 10/08/20 1430    Gareth Morgan, MD 10/10/20 1111

## 2020-10-08 NOTE — Discharge Instructions (Signed)
Take Pepcid as needed to help with your symptoms. Continue your other home medications as previously prescribed. You will need to follow-up with your psychiatrist for any refills of your psychiatric medications. Return to the ER if you start to experience worsening chest pain, shortness of breath, leg swelling, vomiting or coughing up blood.

## 2020-10-08 NOTE — ED Triage Notes (Signed)
Patient c/o "heart racing and heartburn/belching" since yesterday.  Patient states he has been clean x 1 1/2 months. Patient was using meth.  Patient states he has been compliant with taking Protonix.

## 2020-10-10 ENCOUNTER — Other Ambulatory Visit: Payer: Self-pay

## 2020-10-10 ENCOUNTER — Encounter: Payer: Self-pay | Admitting: Internal Medicine

## 2020-10-10 ENCOUNTER — Ambulatory Visit (INDEPENDENT_AMBULATORY_CARE_PROVIDER_SITE_OTHER): Payer: Self-pay | Admitting: Internal Medicine

## 2020-10-10 VITALS — BP 120/78 | HR 96 | Ht 68.0 in | Wt 168.0 lb

## 2020-10-10 DIAGNOSIS — K219 Gastro-esophageal reflux disease without esophagitis: Secondary | ICD-10-CM

## 2020-10-10 DIAGNOSIS — F1910 Other psychoactive substance abuse, uncomplicated: Secondary | ICD-10-CM

## 2020-10-10 DIAGNOSIS — I201 Angina pectoris with documented spasm: Secondary | ICD-10-CM | POA: Insufficient documentation

## 2020-10-10 MED ORDER — ISOSORBIDE MONONITRATE ER 30 MG PO TB24: 15.0000 mg | ORAL_TABLET | Freq: Every day | ORAL | 3 refills | Status: DC

## 2020-10-10 MED ORDER — AMOXICILLIN 500 MG PO TABS: 2000.0000 mg | ORAL_TABLET | Freq: Once | ORAL | 3 refills | Status: AC

## 2020-10-10 MED ORDER — FAMOTIDINE 20 MG PO TABS: 20.0000 mg | ORAL_TABLET | Freq: Two times a day (BID) | ORAL | 0 refills | Status: DC

## 2020-10-10 NOTE — Addendum Note (Signed)
Addended by: Dollene Primrose on: 10/10/2020 03:56 PM   Modules accepted: Orders

## 2020-10-10 NOTE — Progress Notes (Signed)
Cardiology Office Note:    Date:  10/10/2020   ID:  Brian [name], DOB 28-Sep-1986, MRN [id number]  PCP:  Marliss Coots, NP  [facility] Cardiologist:  Sinclair Grooms, MD  [place] Electrophysiologist:  None   Referring MD: Marliss Coots, NP   CC: Chest Pain Consulted for the evaluation of chest pain at the behest of Placey, Audrea Muscat, NP  History of Present Illness:    Brian [name] is a 34 y.o. male with a hx of cocaine/methamphetamine/Tobacco Abuse polysubstance abuse; prior NSTEMIs without EKC changes, LHC without CAD 06/21/20,hx of hiatal hernia.  Last Notable for Preserved EF ~ 50-55% with echo-contrast done during NSTEMI admission; presents for follow up.  Since inpatient assessment dealt with overdose  on zyprexa (suicide attempt) and need for inpatient psych 08/16/20, and ED visit 10/08/20.    Patient notes that he is back in [place], has a place to live, and has been sober 06/19/20 (meth).   No cocaine in years.  Down to 4 cigarettes daily.  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  Notes DOE at work.  Notes sternal chest pain that is spontaneous.  Chest pain is sternal in nature.  No change with Protonix associated with DOE.  No change with coffee.  Worse with lifting boxes.    Past Medical History:  Diagnosis Date  . Alcohol abuse   . Anxiety   . Bipolar 2 disorder ([place])   . Cocaine abuse ([place])   . Depression   . Kidney stones   . Narcotic abuse ([place])   . Peptic ulcer   . Polysubstance abuse ([place])   . Renal disorder    kidney stones    Past Surgical History:  Procedure Laterality Date  . ESOPHAGOGASTRODUODENOSCOPY N/A 05/29/2014   Procedure: ESOPHAGOGASTRODUODENOSCOPY (EGD) ;  Surgeon: Beryle Beams, MD;  Location: [facility] ENDOSCOPY;  Service: Endoscopy;  Laterality: N/A;  check with Dr. Collene Mares about sedation type/timinng - I recommend MAC  . HAND SURGERY  2006  . LEFT HEART CATH AND CORONARY ANGIOGRAPHY N/A 06/21/2020   Procedure: LEFT  HEART CATH AND CORONARY ANGIOGRAPHY;  Surgeon: Nelva Bush, MD;  Location: [place] CV LAB;  Service: Cardiovascular;  Laterality: N/A;    Current Medications: Current Meds  Medication Sig  . buPROPion (WELLBUTRIN XL) 150 MG 24 hr tablet Take 1 tablet (150 mg total) by mouth daily.  . mirtazapine (REMERON) 30 MG tablet Take 30 mg by mouth at bedtime.  . Nutritional Supplements (,FEEDING SUPPLEMENT, PROSOURCE PLUS) liquid Take 30 mLs by mouth 2 (two) times daily between meals.  Marland Kitchen QUEtiapine (SEROQUEL) 200 MG tablet Take 200 mg by mouth at bedtime.  . [DISCONTINUED] famotidine (PEPCID) 20 MG tablet Take 1 tablet (20 mg total) by mouth 2 (two) times daily.  . [DISCONTINUED] mirtazapine (REMERON) 7.5 MG tablet Take 1 tablet (7.5 mg total) by mouth at bedtime. (Patient taking differently: Take 30 mg by mouth at bedtime.)    Allergies:   Fish allergy and Shellfish allergy   Social History   Socioeconomic History  . Marital status: Single    Spouse name: Not on file  . Number of children: Not on file  . Years of education: Not on file  . Highest education level: Not on file  Occupational History  . Not on file  Tobacco Use  . Smoking status: Current Every Day Smoker    Packs/day: 0.50    Types: Cigarettes  . Smokeless  tobacco: Never Used  Vaping Use  . Vaping Use: Never used  Substance and Sexual Activity  . Alcohol use: Not Currently  . Drug use: Not Currently    Types: Marijuana, Cocaine, IV, Methamphetamines    Comment: heroin and meth  . Sexual activity: Not on file  Other Topics Concern  . Not on file  Social History Narrative   Lives with a friend   Lost house and custody of son 9broke up with fiancee)   Nature conservation officer   Social Determinants of Health   Financial Resource Strain: Not on file  Food Insecurity: Not on file  Transportation Needs: Not on file  Physical Activity: Not on file  Stress: Not on file  Social Connections: Not on file    Clean  since 06/19/20 Works at [employer]  Family History: The patient's family history includes Heart disease in his father. History of coronary artery disease notable for mom, father, grandfather, grandmother. History of heart failure notable for grandmother. No history of cardiomyopathies including hypertrophic cardiomyopathy, left ventricular non-compaction, or arrhythmogenic right ventricular cardiomyopathy. History of arrhythmia notable for PPM placement in grandfather. Denies family history of sudden cardiac death including drowning, car accidents, or unexplained deaths in the family No history of bicuspid aortic valve or aortic aneurysm or dissection.  ROS:   Please see the history of present illness.    All other systems reviewed and are negative.  EKGs/Labs/Other Studies Reviewed:    The following studies were reviewed today:  EKG:  EKG is ordered today.  The ekg ordered today demonstrates SR rate 96 Rare PAC 10/09/20  Sinus Tachycardia rate 125  Transthoracic Echocardiogram: Date: 07/02/20 Results: 1. Limited echo no color/doppler EF is normal previously described RWMAls  not apparent Apex not well seen.  2. Left ventricular ejection fraction, by estimation, is 55 to 60%. The  left ventricle has normal function. The left ventricle has no regional  wall motion abnormalities.  3. Right ventricular systolic function is normal. The right ventricular  size is normal.  4. The mitral valve is normal in structure. No evidence of mitral valve  regurgitation. No evidence of mitral stenosis.  5. The aortic valve is normal in structure. Aortic valve regurgitation is  not visualized. No aortic stenosis is present.  6. The inferior vena cava is normal in size with greater than 50%  respiratory variability, suggesting right atrial pressure of 3 mmHg.   Left Heart Catheterizations: Date:06/21/20 Results: Conclusions: 1. No angiographically significant atherosclerotic  coronary artery disease.  Moderate transient stenosis at the ostium of the RCA resolved with intracoronary nitroglycerin and is consistent with vasospasm. 2. Sluggish flow in the distal LAD, which may reflect an element of microvascular dysfunction. 3. Mildly elevated left ventricular filling pressure.  Recommendations: 1. Encourage abstinence from drugs and tobacco. 2. Dual antiplatelet therapy with aspirin and clopidogrel for medical management of MINOCA. 3. If blood pressure tolerates, low-dose diltiazem could be considered if the patient continues to have intermittent angina.   Recent Labs: 10/08/2020: ALT 30; BUN 19; Creatinine, Ser 1.34; Hemoglobin 14.3; Platelets 301; Potassium 4.2; Sodium 139  Recent Lipid Panel    Component Value Date/Time   CHOL 152 03/28/2018 0604   TRIG 99 03/28/2018 0604   HDL 44 03/28/2018 0604   CHOLHDL 3.5 03/28/2018 0604   VLDL 20 03/28/2018 0604   LDLCALC 88 03/28/2018 0604    Risk Assessment/Calculations:     N/A  Physical Exam:    VS:  BP 120/78  Pulse 96   Ht _0  (1.727 m)   Wt 168 lb (76.2 kg)   SpO2 97%   BMI 25.54 kg/m     Wt Readings from Last 3 Encounters:  10/10/20 168 lb (76.2 kg)  10/08/20 180 lb (81.6 kg)  08/14/20 165 lb 4.8 oz (75 kg)    GEN:  Well nourished, well developed in no acute distress HEENT: Normal NECK: No JVD; No carotid bruits LYMPHATICS: No lymphadenopathy CARDIAC: RRR, no murmurs, rubs, gallops; no bruit over R radial site RESPIRATORY:  Clear to auscultation without rales, wheezing or rhonchi  ABDOMEN: Soft, non-tender, non-distended MUSCULOSKELETAL:  No edema; No deformity  SKIN: Warm and dry NEUROLOGIC:  Alert and oriented x 3 PSYCHIATRIC:  Normal affect   ASSESSMENT:    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 Polysubstance abuse ([place])   3. Gastroesophageal reflux disease, unspecified whether esophagitis present    PLAN:    In order of problems listed above:  Coronary Artery  Vasospasm; in the setting of polysubstance abuse Last Meth 10/21, since 2013 PACs - symptomatic  - anatomy: patent coronary arteries  - will consider ASA after GERD and testing  - continue statin, goal LDL < 70 - continue nitrates; Imdur 15 mg PO  - Would recommend exercise nuclear medicine stress test (NPO at midnight); discussed risks, benefits, and alternatives of the diagnostic procedure including chest pain, arrhythmia, and death.  Patient amenable for testing.  To look for microvascular disease  GERD One time famotidine 20 mg PO for 30 days until he sees his   Vague history of endocarditis:  No diagnosis since last IV drug use (2016) - temporarily will give 2 g amoxicillin PO PRN one hour prior to dental work  Insurance will start 10/27/2020 Patient needs paper prescriptions to get filled by PCP  Tobacco Abuse - discussed the dangers of tobacco use, both inhaled and oral, which include, but are not limited to cardiovascular disease, increased cancer risk of multiple types of cancer, COPD, peripheral arterial disease, strokes. - counseled on the benefits of smoking cessation. - firmly advised to quit.  - we also reviewed strategies to maximize success, including:  Removing cigarettes and smoking materials from environment   Stress management  Substitution of other forms of reinforcement (the one cigarette a day approach)  Support of family/friends and group smoking cessation  Selecting a quit date  Patient provided contact information for [employer]uit[facility] or 1-800-QUIT-NOW  Patient provided with [place] 8 free smoking cessation classes: [phone] and [email or web address]     6-8 weeks follow up unless new symptoms or abnormal test results warranting change in plan  Would be reasonable for APP Follow up  Shared Decision Making/Informed Consent The risks [chest pain, shortness of breath, cardiac arrhythmias, dizziness, blood pressure fluctuations,  myocardial infarction, stroke/transient ischemic attack, nausea, vomiting, allergic reaction, radiation exposure, metallic taste sensation and life-threatening complications (estimated to be 1 in 10,000)], benefits (risk stratification, diagnosing coronary artery disease, treatment guidance) and alternatives of a nuclear stress test were discussed in detail with Brian [name] and he agrees to proceed.   Medication Adjustments/Labs and Tests Ordered: Current medicines are reviewed at length with the patient today.  Concerns regarding medicines are outlined above.  Orders Placed This Encounter  Procedures  . MYOCARDIAL PERFUSION IMAGING  . EKG 12-Lead   Meds ordered this encounter  Medications  . isosorbide mononitrate (IMDUR) 30 MG 24 hr tablet    Sig: Take 0.5 tablets (15 mg total)  by mouth daily.    Dispense:  45 tablet    Refill:  3  . amoxicillin (AMOXIL) 500 MG tablet    Sig: Take 4 tablets (2,000 mg total) by mouth once for 1 dose. Take 4 tablets one hour prior to dental cleanings and procedures.    Dispense:  4 tablet    Refill:  3  . famotidine (PEPCID) 20 MG tablet    Sig: Take 1 tablet (20 mg total) by mouth 2 (two) times daily. Please contact your primary care provider for refills.    Dispense:  60 tablet    Refill:  0    Patient Instructions  Medication Instructions:  Your physician has recommended you make the following change in your medication:   Begin:  Imdur (isosorbide mononitrate) 35m (half tablet), by mouth once daily.  Amoxicillin, 20052m(4 tablets) by mouth 1 hour prior to dental cleanings and procedures.   Labwork: None ordered.  Testing/Procedures: Your physician has requested that you have en exercise stress myoview. For further information please visit [email or web address] follow instruction sheet, as given.   Follow-Up: Your physician recommends that you schedule a follow-up appointment in:   6-9 weeks after stress test  Any Other  Special Instructions Will Be Listed Below (If Applicable).     If you need a refill on your cardiac medications before your next appointment, please call your pharmacy.     Signed, MaWerner LeanMD  10/10/2020 11:51 AM    [place]

## 2020-10-10 NOTE — Patient Instructions (Addendum)
Medication Instructions:  Your physician has recommended you make the following change in your medication:   Begin:  Imdur (isosorbide mononitrate) 15mg  (half tablet), by mouth once daily.  Amoxicillin, 2000mg  (4 tablets) by mouth 1 hour prior to dental cleanings and procedures.   Labwork: None ordered.  Testing/Procedures: Your physician has requested that you have en exercise stress myoview. For further information please visit [email or web address]. Please follow instruction sheet, as given.   Follow-Up: Your physician recommends that you schedule a follow-up appointment in:   6-9 weeks after stress test  Any Other Special Instructions Will Be Listed Below (If Applicable).     If you need a refill on your cardiac medications before your next appointment, please call your pharmacy.

## 2020-10-14 ENCOUNTER — Ambulatory Visit (HOSPITAL_COMMUNITY)
Admission: EM | Admit: 2020-10-14 | Discharge: 2020-10-15 | Disposition: A | Discharge status: Home / Self Care | Payer: No Payment, Other | Attending: Family | Admitting: Family

## 2020-10-14 ENCOUNTER — Encounter (HOSPITAL_COMMUNITY): Payer: Self-pay

## 2020-10-14 ENCOUNTER — Emergency Department (HOSPITAL_COMMUNITY)
Admission: EM | Admit: 2020-10-14 | Discharge: 2020-10-14 | Disposition: A | Discharge status: Home / Self Care | Payer: Self-pay | Attending: Emergency Medicine | Admitting: Emergency Medicine

## 2020-10-14 ENCOUNTER — Other Ambulatory Visit: Payer: Self-pay

## 2020-10-14 DIAGNOSIS — F17210 Nicotine dependence, cigarettes, uncomplicated: Secondary | ICD-10-CM | POA: Insufficient documentation

## 2020-10-14 DIAGNOSIS — R109 Unspecified abdominal pain: Secondary | ICD-10-CM | POA: Insufficient documentation

## 2020-10-14 DIAGNOSIS — F1510 Other stimulant abuse, uncomplicated: Secondary | ICD-10-CM | POA: Insufficient documentation

## 2020-10-14 DIAGNOSIS — R1110 Vomiting, unspecified: Secondary | ICD-10-CM | POA: Insufficient documentation

## 2020-10-14 DIAGNOSIS — Z20822 Contact with and (suspected) exposure to covid-19: Secondary | ICD-10-CM | POA: Insufficient documentation

## 2020-10-14 DIAGNOSIS — Z9151 Personal history of suicidal behavior: Secondary | ICD-10-CM | POA: Insufficient documentation

## 2020-10-14 DIAGNOSIS — F329 Major depressive disorder, single episode, unspecified: Secondary | ICD-10-CM | POA: Insufficient documentation

## 2020-10-14 DIAGNOSIS — F1994 Other psychoactive substance use, unspecified with psychoactive substance-induced mood disorder: Secondary | ICD-10-CM

## 2020-10-14 DIAGNOSIS — Z79899 Other long term (current) drug therapy: Secondary | ICD-10-CM | POA: Insufficient documentation

## 2020-10-14 DIAGNOSIS — R45851 Suicidal ideations: Secondary | ICD-10-CM | POA: Insufficient documentation

## 2020-10-14 LAB — CBC
HCT: 44 % (ref 39.0–52.0)
Hemoglobin: 14.1 g/dL (ref 13.0–17.0)
MCH: 29.5 pg (ref 26.0–34.0)
MCHC: 32 g/dL (ref 30.0–36.0)
MCV: 92.1 fL (ref 80.0–100.0)
Platelets: 311 10*3/uL (ref 150–400)
RBC: 4.78 MIL/uL (ref 4.22–5.81)
RDW: 14.7 % (ref 11.5–15.5)
WBC: 7.9 10*3/uL (ref 4.0–10.5)
nRBC: 0 % (ref 0.0–0.2)

## 2020-10-14 LAB — COMPREHENSIVE METABOLIC PANEL
ALT: 26 U/L (ref 0–44)
AST: 19 U/L (ref 15–41)
Albumin: 4.5 g/dL (ref 3.5–5.0)
Alkaline Phosphatase: 74 U/L (ref 38–126)
Anion gap: 10 (ref 5–15)
BUN: 13 mg/dL (ref 6–20)
CO2: 23 mmol/L (ref 22–32)
Calcium: 9.2 mg/dL (ref 8.9–10.3)
Chloride: 107 mmol/L (ref 98–111)
Creatinine, Ser: 1.21 mg/dL (ref 0.61–1.24)
GFR, Estimated: >60 mL/min (ref >60)
Glucose, Bld: 108 mg/dL — ABNORMAL HIGH (ref 70–99)
Potassium: 4 mmol/L (ref 3.5–5.1)
Sodium: 140 mmol/L (ref 135–145)
Total Bilirubin: 0.3 mg/dL (ref 0.3–1.2)
Total Protein: 8.3 g/dL — ABNORMAL HIGH (ref 6.5–8.1)

## 2020-10-14 LAB — URINALYSIS, ROUTINE W REFLEX MICROSCOPIC
Bilirubin Urine: NEGATIVE
Glucose, UA: NEGATIVE mg/dL
Hgb urine dipstick: NEGATIVE
Ketones, ur: NEGATIVE mg/dL
Leukocytes,Ua: NEGATIVE
Nitrite: NEGATIVE
Protein, ur: NEGATIVE mg/dL
Specific Gravity, Urine: 1.005 (ref 1.005–1.030)
pH: 8 (ref 5.0–8.0)

## 2020-10-14 LAB — RAPID URINE DRUG SCREEN, HOSP PERFORMED
Amphetamines: NOT DETECTED
Barbiturates: NOT DETECTED
Benzodiazepines: NOT DETECTED
Cocaine: NOT DETECTED
Opiates: NOT DETECTED
Tetrahydrocannabinol: NOT DETECTED

## 2020-10-14 LAB — RESP PANEL BY RT-PCR (FLU A&B, COVID) ARPGX2
Influenza A by PCR: NEGATIVE
Influenza B by PCR: NEGATIVE
SARS Coronavirus 2 by RT PCR: NEGATIVE

## 2020-10-14 LAB — LIPASE, BLOOD: Lipase: 31 U/L (ref 11–51)

## 2020-10-14 MED ORDER — ALUM & MAG HYDROXIDE-SIMETH 200-200-20 MG/5ML PO SUSP: 30.0000 mL | ORAL | Status: DC | PRN

## 2020-10-14 MED ORDER — ONDANSETRON 4 MG PO TBDP
4.0000 mg | ORAL_TABLET | Freq: Once | ORAL | Status: AC | PRN
  Administered 2020-10-14: 12:00:00 4 mg via ORAL
  Filled 2020-10-14: qty 1

## 2020-10-14 MED ORDER — TRAZODONE HCL 50 MG PO TABS: 50.0000 mg | ORAL_TABLET | Freq: Every evening | ORAL | Status: DC | PRN

## 2020-10-14 MED ORDER — MAGNESIUM HYDROXIDE 400 MG/5ML PO SUSP: 30.0000 mL | Freq: Every day | ORAL | Status: DC | PRN

## 2020-10-14 MED ORDER — CLONIDINE HCL 0.1 MG PO TABS
0.1000 mg | ORAL_TABLET | Freq: Once | ORAL | Status: AC
  Administered 2020-10-14: 18:00:00 0.1 mg via ORAL
  Filled 2020-10-14: qty 1

## 2020-10-14 MED ORDER — ACETAMINOPHEN 325 MG PO TABS: 650.0000 mg | ORAL_TABLET | Freq: Four times a day (QID) | ORAL | Status: DC | PRN

## 2020-10-14 NOTE — ED Provider Notes (Signed)
[facility] DEPT Provider Note   CSN: [id number] Arrival date & time: 10/14/20  1133     History Chief Complaint  Patient presents with   Flank Pain   Emesis    Brian [name] is a 34 y.o. male.  34 year old male who presents with suicidal ideations related to urged him to use methamphetamines.  Patient notes prior history of suicide attempt with hanging himself as well as drug overdose.  States he has been sober living for the past 3 months and that recently he has had the urge to use methamphetamines.  He knows to be does this that this will cause him great harm.  He also states that he intentionally told the triage nurse misleading information and he apologizes for that.        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[place]) 10/10/2020   Gastroesophageal reflux disease 10/10/2020   Bupropion overdose 08/13/2020   Substance use disorder 08/13/2020   Suicidal ideation 08/13/2020   Pericarditis 07/02/2020   Depression 07/02/2020   Elevated troponin    Non-ST elevation (NSTEMI) myocardial infarction [facility])    Chest pain 06/19/2020   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   Malingering 06/04/2020   Paranoia ([place])    Amphetamine use disorder, severe, 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   Substance induced mood disorder ([place]) 03/26/2018   Bipolar II disorder, severe, depressed, with anxious distress ([place]) 03/25/2018   Cellulitis 05/20/2015   Dysuria 05/20/2015   Smoker 05/20/2015   Homeless single person 05/20/2015   Drug abuse ([place])    Opioid use disorder, severe, dependence ([place]) 03/08/2015    Ileus of unspecified type ([place]) 05/28/2014   Abdominal pain 05/28/2014   AKI (acute kidney injury) ([place]) 05/28/2014   Esophagitis, acute 05/28/2014   Polysubstance abuse ([place]) 05/28/2014   Hematemesis 05/28/2014    Past Surgical History:  Procedure Laterality Date   ESOPHAGOGASTRODUODENOSCOPY N/A 05/29/2014   Procedure: ESOPHAGOGASTRODUODENOSCOPY (EGD) ;  Surgeon: Beryle Beams, MD;  Location: [facility] ENDOSCOPY;  Service: Endoscopy;  Laterality: N/A;  check with Dr. Collene Mares about sedation type/timinng - I recommend MAC   HAND SURGERY  2006   LEFT HEART CATH AND CORONARY ANGIOGRAPHY N/A 06/21/2020   Procedure: LEFT HEART CATH AND CORONARY ANGIOGRAPHY;  Surgeon: Nelva Bush, MD;  Location: [place] CV LAB;  Service: Cardiovascular;  Laterality: N/A;       Family History  Problem Relation Age of Onset   Heart disease Father     Social History   Tobacco Use   Smoking status: Current Every Day Smoker    Packs/day: 0.50    Types: Cigarettes   Smokeless tobacco: Never Used  Vaping Use   Vaping Use: Never used  Substance Use Topics   Alcohol use: Not Currently   Drug use: Not Currently    Types: Marijuana, Cocaine, IV, Methamphetamines    Comment: heroin and meth    Home Medications Prior to Admission medications   Medication Sig Start Date End Date Taking? Authorizing Provider  atorvastatin (LIPITOR) 80 MG tablet Take 1 tablet (80  mg total) by mouth daily. 06/22/20 08/13/20  Sanjuan Dame, MD  buPROPion (WELLBUTRIN XL) 150 MG 24 hr tablet Take 1 tablet (150 mg total) by mouth daily. 06/05/20   Rankin, Shuvon B, NP  famotidine (PEPCID) 20 MG tablet Take 1 tablet (20 mg total) by mouth 2 (two) times daily. Please contact your primary care provider for refills. 10/10/20   Werner Lean, MD  isosorbide mononitrate (IMDUR) 30 MG 24 hr tablet Take 0.5 tablets (15 mg total) by mouth daily. 10/10/20   Chandrasekhar, Lyda Kalata A, MD  mirtazapine (REMERON) 30 MG  tablet Take 30 mg by mouth at bedtime.    [provider]  Nutritional Supplements (,FEEDING SUPPLEMENT, PROSOURCE PLUS) liquid Take 30 mLs by mouth 2 (two) times daily between meals. 07/04/20   Swayze, Ava, DO  pantoprazole (PROTONIX) 40 MG tablet Take 1 tablet (40 mg total) by mouth 2 (two) times daily. 07/04/20 08/13/20  Swayze, Ava, DO  QUEtiapine (SEROQUEL) 200 MG tablet Take 200 mg by mouth at bedtime. 09/18/20   [provider]  FLUoxetine (PROZAC) 20 MG capsule Take 1 capsule (20 mg total) by mouth daily. For depression Patient not taking: Reported on 11/04/2019 04/01/18 05/04/20  Lindell Spar I, NP  gabapentin (NEURONTIN) 300 MG capsule Take 2 capsules (600 mg total) by mouth 3 (three) times daily. For agitation Patient not taking: Reported on 05/02/2020 03/31/18 05/04/20  Lindell Spar I, NP  sucralfate (CARAFATE) 1 g tablet Take 1 tablet (1 g total) by mouth 4 (four) times daily -  with meals and at bedtime. Patient not taking: Reported on 11/04/2019 08/30/19 05/04/20  Joy, Helane Gunther, PA-C  traZODone (DESYREL) 50 MG tablet Take 1 tablet (50 mg total) by mouth at bedtime as needed for sleep. Patient not taking: Reported on 08/30/2019 03/31/18 08/30/19  Lindell Spar I, NP    Allergies    Fish allergy and Shellfish allergy  Review of Systems   Review of Systems  All other systems reviewed and are negative.   Physical Exam Updated Vital Signs BP (!) 146/98    Pulse (!) 108    Temp 98.9 F (37.2 C) (Oral)    Resp (!) 21    SpO2 100%   Physical Exam Vitals and nursing note reviewed.  Constitutional:      General: He is not in acute distress.    Appearance: Normal appearance. He is well-developed and well-nourished. He is not toxic-appearing.  HENT:     Head: Normocephalic and atraumatic.  Eyes:     General: Lids are normal.     Extraocular Movements: EOM normal.     Conjunctiva/sclera: Conjunctivae normal.     Pupils: Pupils are equal, round, and reactive to light.  Neck:      Thyroid: No thyroid mass.     Trachea: No tracheal deviation.  Cardiovascular:     Rate and Rhythm: Normal rate and regular rhythm.     Heart sounds: Normal heart sounds. No murmur heard. No gallop.   Pulmonary:     Effort: Pulmonary effort is normal. No respiratory distress.     Breath sounds: Normal breath sounds. No stridor. No decreased breath sounds, wheezing, rhonchi or rales.  Abdominal:     General: Bowel sounds are normal. There is no distension.     Palpations: Abdomen is soft.     Tenderness: There is no abdominal tenderness. There is no CVA tenderness or rebound.  Musculoskeletal:        General: No tenderness  or edema. Normal range of motion.     Cervical back: Normal range of motion and neck supple.  Skin:    General: Skin is warm and dry.     Findings: No abrasion or rash.  Neurological:     Mental Status: He is alert and oriented to person, place, and time.     GCS: GCS eye subscore is 4. GCS verbal subscore is 5. GCS motor subscore is 6.     Cranial Nerves: No cranial nerve deficit.     Sensory: No sensory deficit.     Deep Tendon Reflexes: Strength normal.  Psychiatric:        Attention and Perception: Attention normal.        Mood and Affect: Mood is anxious.        Speech: Speech normal.        Behavior: Behavior normal.        Thought Content: Thought content includes suicidal ideation. Thought content does not include suicidal plan.     ED Results / Procedures / Treatments   Labs (all labs ordered are listed, but only abnormal results are displayed) Labs Reviewed  COMPREHENSIVE METABOLIC PANEL - Abnormal; Notable for the following components:      Result Value   Glucose, Bld 108 (*)    Total Protein 8.3 (*)    All other components within normal limits  URINALYSIS, ROUTINE W REFLEX MICROSCOPIC - Abnormal; Notable for the following components:   Color, Urine STRAW (*)    All other components within normal limits  RESP PANEL BY RT-PCR (FLU A&B, COVID)  ARPGX2  LIPASE, BLOOD  CBC    EKG None  Radiology No results found.  Procedures Procedures (including critical care time)  Medications Ordered in ED Medications  ondansetron (ZOFRAN-ODT) disintegrating tablet 4 mg (4 mg Oral Given 10/14/20 1209)    ED Course  I have reviewed the triage vital signs and the nursing notes.  Pertinent labs & imaging results that were available during my care of the patient were reviewed by me and considered in my medical decision making (see chart for details).    MDM Rules/Calculators/A&P                         Discussed with the behavioral health NP and they have agreed to accept the patient in transfer to the [facility] Final Clinical Impression(s) / ED Diagnoses Final diagnoses:  None    Rx / DC Orders ED Discharge Orders    None       Lacretia Leigh, MD 10/14/20 1646

## 2020-10-14 NOTE — ED Notes (Signed)
Need covid result before transfer and RN requested a UDS add on

## 2020-10-14 NOTE — ED Notes (Signed)
Pt admitted as a direct-admit from [facility]. Pt became SI in the ED while waiting to be seen for flank pain. Pt reports history of SA, and constantly relapses on heroine;  Pt reports thinking about that causes him to become SI. Pt alert and oriented, denies SI/HI, A/VH at present. Pt is cooperative. Education, support, reassurance, and encouragement provided Pt's belongings in locker. Pt denies any concerns at this time, and verbally contracts for safety. Pt ambulating on the unit with no issues. Pt remains safe on the unit.

## 2020-10-14 NOTE — ED Provider Notes (Signed)
Behavioral Health Admission H&P [facility] & OBS)  Date: 10/14/20 Patient Name: AMEDEE CERRONE MRN: [id number] Chief Complaint: No chief complaint on file.     Diagnoses:  Final diagnoses:  Suicidal ideation   Von Quintanar was transferred from the local emergency department citing suicidal ideations with a plan to hang his self or overdose on medications. Charted history with polysubstance abuse.  Patient reports he currently resides in [facility] [place].  States he has been doing well for the past few months however feels too much freedom.  Stated he has been using [place] products (otc) that gives him the same type of high as amphetamine. Reported these otc product is undetectable in urin or blood. states now " I am in a bad head space, mentally and physically."  Reported a recent relapse in February.  States "this past relapse because my heart to worsen."  Stated multiple attempts with sobriety. " once an addict always an addict." stated multiple missed  follow-up appointments with psychiatry and/or therapy.   Adonis reported multiple stressors.  States poor relationship between him and his son.,  Reports he is currently working for [employer] however states he was placed on light duty and is unable to make the money he was making.  Chronic cravings for methamphetamine.  Overnight observation. Support, encouragement and reassurance was provided.  HPI: Per admission assessment note: 34 year old male who presents with suicidal ideations related to urged him to use methamphetamines.  Patient notes prior history of suicide attempt with hanging himself as well as drug overdose.  States he has been sober living for the past 3 months and that recently he has had the urge to use methamphetamines.  He knows to be does this that this will cause him great harm.  He also states that he intentionally told the triage nurse misleading information and he apologizes for that.  PHQ 2-9:  Flowsheet Row  ED from 05/02/2020 in [place] DEPT  Thoughts that you would be better off dead, or of hurting yourself in some way More than half the days  PHQ-9 Total Score 12      Flowsheet Row ED from 10/14/2020 in [facility] DEPT ED to Hosp-Admission (Discharged) from 08/13/2020 in [facility] ED from 08/09/2020 in [facility] CATEGORY High Risk High Risk High Risk       Total Time spent with patient: 15 minutes  Musculoskeletal  Strength & Muscle Tone: within normal limits Gait & Station: normal Patient leans: N/A  Psychiatric Specialty Exam  Presentation General Appearance: Appropriate for Environment  Eye Contact:Good  Speech:Clear and Coherent; Normal Rate  Speech Volume:Normal  Handedness:Right   Mood and Affect  Mood:Euthymic  Affect:Appropriate   Thought Process  Thought Processes:Coherent  Descriptions of Associations:Intact  Orientation:Full (Time, Place and Person)  Thought Content:Logical  Hallucinations:No data recorded Ideas of Reference:None  Suicidal Thoughts:No data recorded Homicidal Thoughts:No data recorded  Sensorium  Memory:Immediate Good; Recent Good; Remote Good  Judgment:Fair  Insight:Fair   Executive Functions  Concentration:Fair  Attention Span:Fair  [place]   Psychomotor Activity  Psychomotor Activity:No data recorded  Assets  Assets:Communication Skills; Desire for Improvement   Sleep  Sleep:No data recorded  Physical Exam ROS  There were no vitals taken for this visit. There is no height or weight on file to calculate BMI.  Past Psychiatric History:  Is the patient at risk to self? No  Has the patient been a risk to self in the past 6 months? No .    Has the patient been a risk to self within the distant past? No   Is  the patient a risk to others? No   Has the patient been a risk to others in the past 6 months? No   Has the patient been a risk to others within the distant past? No   Past Medical History:  Past Medical History:  Diagnosis Date  . Alcohol abuse   . Anxiety   . Bipolar 2 disorder ([place])   . Cocaine abuse ([place])   . Depression   . Kidney stones   . Narcotic abuse ([place])   . Peptic ulcer   . Polysubstance abuse ([place])   . Renal disorder    kidney stones    Past Surgical History:  Procedure Laterality Date  . ESOPHAGOGASTRODUODENOSCOPY N/A 05/29/2014   Procedure: ESOPHAGOGASTRODUODENOSCOPY (EGD) ;  Surgeon: Beryle Beams, MD;  Location: [facility] ENDOSCOPY;  Service: Endoscopy;  Laterality: N/A;  check with Dr. Collene Mares about sedation type/timinng - I recommend MAC  . HAND SURGERY  2006  . LEFT HEART CATH AND CORONARY ANGIOGRAPHY N/A 06/21/2020   Procedure: LEFT HEART CATH AND CORONARY ANGIOGRAPHY;  Surgeon: Nelva Bush, MD;  Location: [facility] CV LAB;  Service: Cardiovascular;  Laterality: N/A;    Family History:  Family History  Problem Relation Age of Onset  . Heart disease Father     Social History:  Social History   Socioeconomic History  . Marital status: Single    Spouse name: Not on file  . Number of children: Not on file  . Years of education: Not on file  . Highest education level: Not on file  Occupational History  . Not on file  Tobacco Use  . Smoking status: Current Every Day Smoker    Packs/day: 0.50    Types: Cigarettes  . Smokeless tobacco: Never Used  Vaping Use  . Vaping Use: Never used  Substance and Sexual Activity  . Alcohol use: Not Currently  . Drug use: Not Currently    Types: Marijuana, Cocaine, IV, Methamphetamines    Comment: heroin and meth  . Sexual activity: Not on file  Other Topics Concern  . Not on file  Social History Narrative   Lives with a friend   Lost house and custody of son 9broke up with fiancee)   Nature conservation officer    Social Determinants of Radio broadcast assistant Strain: Not on file  Food Insecurity: Not on file  Transportation Needs: Not on file  Physical Activity: Not on file  Stress: Not on file  Social Connections: Not on file  Intimate Partner Violence: Not on file    SDOH:  SDOH Screenings   Alcohol Screen: Low Risk   . Last Alcohol Screening Score (AUDIT): 4  Depression (PHQ2-9): Medium Risk  . PHQ-2 Score: 12  Financial Resource Strain: Not on file  Food Insecurity: Not on file  Housing: High Risk  . Last Housing Risk Score: 2  Physical Activity: Not on file  Social Connections: Not on file  Stress: Not on file  Tobacco Use: High Risk  . Smoking Tobacco Use: Current Every Day Smoker  . Smokeless Tobacco Use: Never Used  Transportation Needs: Not on file    Last Labs:  Admission on 10/14/2020, Discharged on 10/14/2020  Component Date Value Ref Range Status  .  Lipase 10/14/2020 31  11 - 51 U/L Final   Performed at [facility], [place] [address] [place], [place] [address]  . Sodium 10/14/2020 140  135 - 145 mmol/L Final  . Potassium 10/14/2020 4.0  3.5 - 5.1 mmol/L Final  . Chloride 10/14/2020 107  98 - 111 mmol/L Final  . CO2 10/14/2020 23  22 - 32 mmol/L Final  . Glucose, Bld 10/14/2020 108* 70 - 99 mg/dL Final   Glucose reference range applies only to samples taken after fasting for at least 8 hours.  . BUN 10/14/2020 13  6 - 20 mg/dL Final  . Creatinine, Ser 10/14/2020 1.21  0.61 - 1.24 mg/dL Final  . Calcium 10/14/2020 9.2  8.9 - 10.3 mg/dL Final  . Total Protein 10/14/2020 8.3* 6.5 - 8.1 g/dL Final  . Albumin 10/14/2020 4.5  3.5 - 5.0 g/dL Final  . AST 10/14/2020 19  15 - 41 U/L Final  . ALT 10/14/2020 26  0 - 44 U/L Final  . Alkaline Phosphatase 10/14/2020 74  38 - 126 U/L Final  . Total Bilirubin 10/14/2020 0.3  0.3 - 1.2 mg/dL Final  . GFR, Estimated 10/14/2020 >60  >60 mL/min Final   Comment: (NOTE) Calculated using the CKD-EPI  Creatinine Equation (2021)   . Anion gap 10/14/2020 10  5 - 15 Final   Performed at [facility], [place] [address], [place], [place] [address]  . WBC 10/14/2020 7.9  4.0 - 10.5 K/uL Final  . RBC 10/14/2020 4.78  4.22 - 5.81 MIL/uL Final  . Hemoglobin 10/14/2020 14.1  13.0 - 17.0 g/dL Final  . HCT 10/14/2020 44.0  39.0 - 52.0 % Final  . MCV 10/14/2020 92.1  80.0 - 100.0 fL Final  . MCH 10/14/2020 29.5  26.0 - 34.0 pg Final  . MCHC 10/14/2020 32.0  30.0 - 36.0 g/dL Final  . RDW 10/14/2020 14.7  11.5 - 15.5 % Final  . Platelets 10/14/2020 311  150 - 400 K/uL Final  . nRBC 10/14/2020 0.0  0.0 - 0.2 % Final   Performed at [facility], [place] [address] [place], [place] [address]  . Color, Urine 10/14/2020 STRAW* YELLOW Final  . APPearance 10/14/2020 CLEAR  CLEAR Final  . Specific Gravity, Urine 10/14/2020 1.005  1.005 - 1.030 Final  . pH 10/14/2020 8.0  5.0 - 8.0 Final  . Glucose, UA 10/14/2020 NEGATIVE  NEGATIVE mg/dL Final  . Hgb urine dipstick 10/14/2020 NEGATIVE  NEGATIVE Final  . Bilirubin Urine 10/14/2020 NEGATIVE  NEGATIVE Final  . Ketones, ur 10/14/2020 NEGATIVE  NEGATIVE mg/dL Final  . Protein, ur 10/14/2020 NEGATIVE  NEGATIVE mg/dL Final  . Nitrite 10/14/2020 NEGATIVE  NEGATIVE Final  . Chalmers Guest 10/14/2020 NEGATIVE  NEGATIVE Final   Performed at [facility], [place] [address] [place],  [address]  . SARS Coronavirus 2 by RT PCR 10/14/2020 NEGATIVE  NEGATIVE Final   Comment: (NOTE) SARS-CoV-2 target nucleic acids are NOT DETECTED.  The SARS-CoV-2 RNA is generally detectable in upper respiratory specimens during the acute phase of infection. The lowest concentration of SARS-CoV-2 viral copies this assay can detect is 138 copies/mL. A negative result does not preclude SARS-Cov-2 infection and should not be used as the sole basis for treatment or other patient management decisions. A negative result may occur with   improper specimen collection/handling, submission of specimen other than nasopharyngeal swab, presence of viral mutation(s) within the areas targeted by this assay, and inadequate  number of viral copies(<138 copies/mL). A negative result must be combined with clinical observations, patient history, and epidemiological information. The expected result is Negative.  Fact Sheet for Patients:  EntrepreneurPulse.com.au  Fact Sheet for Healthcare Providers:  IncredibleEmployment.be  This test is no                          t yet approved or cleared by the [place] FDA and  has been authorized for detection and/or diagnosis of SARS-CoV-2 by FDA under an Emergency Use Authorization (EUA). This EUA will remain  in effect (meaning this test can be used) for the duration of the COVID-19 declaration under Section 564(b)(1) of the Act, 21 U.S.C.section 360bbb-3(b)(1), unless the authorization is terminated  or revoked sooner.      . Influenza A by PCR 10/14/2020 NEGATIVE  NEGATIVE Final  . Influenza B by PCR 10/14/2020 NEGATIVE  NEGATIVE Final   Comment: (NOTE) The Xpert Xpress SARS-CoV-2/FLU/RSV plus assay is intended as an aid in the diagnosis of influenza from Nasopharyngeal swab specimens and should not be used as a sole basis for treatment. Nasal washings and aspirates are unacceptable for Xpert Xpress SARS-CoV-2/FLU/RSV testing.  Fact Sheet for Patients: EntrepreneurPulse.com.au  Fact Sheet for Healthcare Providers: IncredibleEmployment.be  This test is not yet approved or cleared by the [place] FDA and has been authorized for detection and/or diagnosis of SARS-CoV-2 by FDA under an Emergency Use Authorization (EUA). This EUA will remain in effect (meaning this test can be used) for the duration of the COVID-19 declaration under Section 564(b)(1) of the Act, 21 U.S.C. section 360bbb-3(b)(1), unless  the authorization is terminated or revoked.  Performed at [facility], [place] [address] [place], [place] [address]   . Opiates 10/14/2020 NONE DETECTED  NONE DETECTED Final  . Cocaine 10/14/2020 NONE DETECTED  NONE DETECTED Final  . Benzodiazepines 10/14/2020 NONE DETECTED  NONE DETECTED Final  . Amphetamines 10/14/2020 NONE DETECTED  NONE DETECTED Final  . Tetrahydrocannabinol 10/14/2020 NONE DETECTED  NONE DETECTED Final  . Barbiturates 10/14/2020 NONE DETECTED  NONE DETECTED Final   Comment: (NOTE) DRUG SCREEN FOR MEDICAL PURPOSES ONLY.  IF CONFIRMATION IS NEEDED FOR ANY PURPOSE, NOTIFY LAB WITHIN 5 DAYS.  LOWEST DETECTABLE LIMITS FOR URINE DRUG SCREEN Drug Class                     Cutoff (ng/mL) Amphetamine and metabolites    1000 Barbiturate and metabolites    200 Benzodiazepine                 614 Tricyclics and metabolites     300 Opiates and metabolites        300 Cocaine and metabolites        300 THC                            50 Performed at [facility], [place] [address], [place], [place] [address]   Admission on 10/08/2020, Discharged on 10/08/2020  Component Date Value Ref Range Status  . WBC 10/08/2020 8.0  4.0 - 10.5 K/uL Final  . RBC 10/08/2020 4.82  4.22 - 5.81 MIL/uL Final  . Hemoglobin 10/08/2020 14.3  13.0 - 17.0 g/dL Final  . HCT 10/08/2020 44.2  39.0 - 52.0 % Final  . MCV 10/08/2020 91.7  80.0 - 100.0 fL Final  . MCH 10/08/2020 29.7  26.0 - 34.0 pg Final  . MCHC 10/08/2020 32.4  30.0 - 36.0 g/dL Final  . RDW 10/08/2020 14.6  11.5 - 15.5 % Final  . Platelets 10/08/2020 301  150 - 400 K/uL Final  . nRBC 10/08/2020 0.0  0.0 - 0.2 % Final   Performed at [facility], [place] [address] [place], [place] [address]  . Troponin I (High Sensitivity) 10/08/2020 9  <18 ng/L Final   Comment: (NOTE) Elevated high sensitivity troponin I (hsTnI) values and significant  changes across serial measurements may  suggest ACS but many other  chronic and acute conditions are known to elevate hsTnI results.  Refer to the "Links" section for chest pain algorithms and additional  guidance. Performed at [facility], [place] [address] [place], [place] [address]   . Sodium 10/08/2020 139  135 - 145 mmol/L Final  . Potassium 10/08/2020 4.2  3.5 - 5.1 mmol/L Final  . Chloride 10/08/2020 107  98 - 111 mmol/L Final  . CO2 10/08/2020 23  22 - 32 mmol/L Final  . Glucose, Bld 10/08/2020 108* 70 - 99 mg/dL Final   Glucose reference range applies only to samples taken after fasting for at least 8 hours.  . BUN 10/08/2020 19  6 - 20 mg/dL Final  . Creatinine, Ser 10/08/2020 1.34* 0.61 - 1.24 mg/dL Final  . Calcium 10/08/2020 9.4  8.9 - 10.3 mg/dL Final  . Total Protein 10/08/2020 8.3* 6.5 - 8.1 g/dL Final  . Albumin 10/08/2020 4.5  3.5 - 5.0 g/dL Final  . AST 10/08/2020 25  15 - 41 U/L Final  . ALT 10/08/2020 30  0 - 44 U/L Final  . Alkaline Phosphatase 10/08/2020 67  38 - 126 U/L Final  . Total Bilirubin 10/08/2020 0.4  0.3 - 1.2 mg/dL Final  . GFR, Estimated 10/08/2020 >60  >60 mL/min Final   Comment: (NOTE) Calculated using the CKD-EPI Creatinine Equation (2021)   . Anion gap 10/08/2020 9  5 - 15 Final   Performed at [facility], [place] [address], [place], [place] [address]  . Lipase 10/08/2020 31  11 - 51 U/L Final   Performed at [facility], [place] [address], [place], [place] [address]  . D-Dimer, Quant 10/08/2020 0.27  0.00 - 0.50 ug/mL-FEU Final   Comment: (NOTE) At the manufacturer cut-off value of 0.5 g/mL FEU, this assay has a negative predictive value of 95-100%.This assay is intended for use in conjunction with a clinical pretest probability (PTP) assessment model to exclude pulmonary embolism (PE) and deep venous thrombosis (DVT) in outpatients suspected of PE or DVT. Results should be correlated with clinical presentation. Performed at  [facility], [place] [address] [place], [place] [address]   . Troponin I (High Sensitivity) 10/08/2020 <2  <18 ng/L Final   Comment: (NOTE) Elevated high sensitivity troponin I (hsTnI) values and significant  changes across serial measurements may suggest ACS but many other  chronic and acute conditions are known to elevate hsTnI results.  Refer to the "Links" section for chest pain algorithms and additional  guidance. Performed at [facility], [place] [address], [place], [place] [address]   Admission on 08/13/2020, Discharged on 08/16/2020  Component Date Value Ref Range Status  . Sodium 08/13/2020 139  135 - 145 mmol/L Final  . Potassium 08/13/2020 4.2  3.5 - 5.1 mmol/L Final  . Chloride 08/13/2020 104  98 - 111 mmol/L Final  . CO2  08/13/2020 25  22 - 32 mmol/L Final  . Glucose, Bld 08/13/2020 98  70 - 99 mg/dL Final   Glucose reference range applies only to samples taken after fasting for at least 8 hours.  . BUN 08/13/2020 10  6 - 20 mg/dL Final  . Creatinine, Ser 08/13/2020 1.24  0.61 - 1.24 mg/dL Final  . Calcium 08/13/2020 9.3  8.9 - 10.3 mg/dL Final  . Total Protein 08/13/2020 8.5* 6.5 - 8.1 g/dL Final  . Albumin 08/13/2020 4.5  3.5 - 5.0 g/dL Final  . AST 08/13/2020 45* 15 - 41 U/L Final  . ALT 08/13/2020 42  0 - 44 U/L Final  . Alkaline Phosphatase 08/13/2020 95  38 - 126 U/L Final  . Total Bilirubin 08/13/2020 0.6  0.3 - 1.2 mg/dL Final  . GFR, Estimated 08/13/2020 >60  >60 mL/min Final  . Anion gap 08/13/2020 10  5 - 15 Final   Performed at [facility], [place] [address], [name], [place] [address]  . Alcohol, Ethyl (B) 08/13/2020 <10  <10 mg/dL Final   Comment: (NOTE) Lowest detectable limit for serum alcohol is 10 mg/dL.  For medical purposes only. Performed at [facility], [place] [address] [place], [place] [address]   . Salicylate Lvl 41/93/7902 <7.0* 7.0 - 30.0 mg/dL Final   Performed at  [place] [address], [place], [place] [address]  . Acetaminophen (Tylenol), Serum 08/13/2020 <10* 10 - 30 ug/mL Final   Comment: (NOTE) Therapeutic concentrations vary significantly. A range of 10-30 ug/mL  may be an effective concentration for many patients. However, some  are best treated at concentrations outside of this range. Acetaminophen concentrations >150 ug/mL at 4 hours after ingestion  and >50 ug/mL at 12 hours after ingestion are often associated with  toxic reactions.  Performed at [facility], [place] [address], [place], [place] [address]   . WBC 08/13/2020 13.3* 4.0 - 10.5 K/uL Final  . RBC 08/13/2020 4.34  4.22 - 5.81 MIL/uL Final  . Hemoglobin 08/13/2020 13.1  13.0 - 17.0 g/dL Final  . HCT 08/13/2020 39.8  39.0 - 52.0 % Final  . MCV 08/13/2020 91.7  80.0 - 100.0 fL Final  . MCH 08/13/2020 30.2  26.0 - 34.0 pg Final  . MCHC 08/13/2020 32.9  30.0 - 36.0 g/dL Final  . RDW 08/13/2020 12.6  11.5 - 15.5 % Final  . Platelets 08/13/2020 250  150 - 400 K/uL Final  . nRBC 08/13/2020 0.0  0.0 - 0.2 % Final   Performed at [facility], [place] [address] [place], [place] [address]  . Opiates 08/13/2020 NONE DETECTED  NONE DETECTED Final  . Cocaine 08/13/2020 NONE DETECTED  NONE DETECTED Final  . Benzodiazepines 08/13/2020 NONE DETECTED  NONE DETECTED Final  . Amphetamines 08/13/2020 NONE DETECTED  NONE DETECTED Final  . Tetrahydrocannabinol 08/13/2020 POSITIVE* NONE DETECTED Final  . Barbiturates 08/13/2020 NONE DETECTED  NONE DETECTED Final   Comment: (NOTE) DRUG SCREEN FOR MEDICAL PURPOSES ONLY.  IF CONFIRMATION IS NEEDED FOR ANY PURPOSE, NOTIFY LAB WITHIN 5 DAYS.  LOWEST DETECTABLE LIMITS FOR URINE DRUG SCREEN Drug Class                     Cutoff (ng/mL) Amphetamine and metabolites    1000 Barbiturate and metabolites    200 Benzodiazepine                 341 Tricyclics  and metabolites     300 Opiates and  metabolites        300 Cocaine and metabolites        300 THC                            50 Performed at [facility], [place] [address], [place], [place] [address]   . Glucose-Capillary 08/13/2020 90  70 - 99 mg/dL Final   Glucose reference range applies only to samples taken after fasting for at least 8 hours.  Marland Kitchen SARS Coronavirus 2 by RT PCR 08/13/2020 NEGATIVE  NEGATIVE Final   Comment: (NOTE) SARS-CoV-2 target nucleic acids are NOT DETECTED.  The SARS-CoV-2 RNA is generally detectable in upper respiratoy specimens during the acute phase of infection. The lowest concentration of SARS-CoV-2 viral copies this assay can detect is 131 copies/mL. A negative result does not preclude SARS-Cov-2 infection and should not be used as the sole basis for treatment or other patient management decisions. A negative result may occur with  improper specimen collection/handling, submission of specimen other than nasopharyngeal swab, presence of viral mutation(s) within the areas targeted by this assay, and inadequate number of viral copies (<131 copies/mL). A negative result must be combined with clinical observations, patient history, and epidemiological information. The expected result is Negative.  Fact Sheet for Patients:  PinkCheek.be  Fact Sheet for Healthcare Providers:  GravelBags.it  This test is no                          t yet approved or cleared by the [place] FDA and  has been authorized for detection and/or diagnosis of SARS-CoV-2 by FDA under an Emergency Use Authorization (EUA). This EUA will remain  in effect (meaning this test can be used) for the duration of the COVID-19 declaration under Section 564(b)(1) of the Act, 21 U.S.C. section 360bbb-3(b)(1), unless the authorization is terminated or revoked sooner.    . Influenza A by PCR 08/13/2020 NEGATIVE  NEGATIVE Final  . Influenza B by PCR  08/13/2020 NEGATIVE  NEGATIVE Final   Comment: (NOTE) The Xpert Xpress SARS-CoV-2/FLU/RSV assay is intended as an aid in  the diagnosis of influenza from Nasopharyngeal swab specimens and  should not be used as a sole basis for treatment. Nasal washings and  aspirates are unacceptable for Xpert Xpress SARS-CoV-2/FLU/RSV  testing.  Fact Sheet for Patients: PinkCheek.be  Fact Sheet for Healthcare Providers: GravelBags.it  This test is not yet approved or cleared by the [place] FDA and  has been authorized for detection and/or diagnosis of SARS-CoV-2 by  FDA under an Emergency Use Authorization (EUA). This EUA will remain  in effect (meaning this test can be used) for the duration of the  Covid-19 declaration under Section 564(b)(1) of the Act, 21  U.S.C. section 360bbb-3(b)(1), unless the authorization is  terminated or revoked.   Marland Kitchen Respiratory Syncytial Virus by PCR 08/13/2020 NEGATIVE  NEGATIVE Final   Comment: (NOTE) Fact Sheet for Patients: PinkCheek.be  Fact Sheet for Healthcare Providers: GravelBags.it  This test is not yet approved or cleared by the [place] FDA and  has been authorized for detection and/or diagnosis of SARS-CoV-2 by  FDA under an Emergency Use Authorization (EUA). This EUA will remain  in effect (meaning this test can be used) for the duration of the  COVID-19 declaration under Section 564(b)(1) of the Act, 21 U.S.C.  section 360bbb-3(b)(1), unless the authorization is terminated or  revoked. Performed at [facility], [place] [address] [place], [place] [address]   . Sodium 08/14/2020 138  135 - 145 mmol/L Final  . Potassium 08/14/2020 3.6  3.5 - 5.1 mmol/L Final  . Chloride 08/14/2020 105  98 - 111 mmol/L Final  . CO2 08/14/2020 23  22 - 32 mmol/L Final  . Glucose, Bld 08/14/2020 90  70 - 99 mg/dL Final    Glucose reference range applies only to samples taken after fasting for at least 8 hours.  . BUN 08/14/2020 10  6 - 20 mg/dL Final  . Creatinine, Ser 08/14/2020 1.19  0.61 - 1.24 mg/dL Final  . Calcium 08/14/2020 8.8* 8.9 - 10.3 mg/dL Final  . Total Protein 08/14/2020 7.2  6.5 - 8.1 g/dL Final  . Albumin 08/14/2020 3.8  3.5 - 5.0 g/dL Final  . AST 08/14/2020 34  15 - 41 U/L Final  . ALT 08/14/2020 35  0 - 44 U/L Final  . Alkaline Phosphatase 08/14/2020 76  38 - 126 U/L Final  . Total Bilirubin 08/14/2020 0.7  0.3 - 1.2 mg/dL Final  . GFR, Estimated 08/14/2020 >60  >60 mL/min Final  . Anion gap 08/14/2020 10  5 - 15 Final   Performed at [facility], [place] [address], [place], [place] [address]  . WBC 08/14/2020 8.1  4.0 - 10.5 K/uL Final  . RBC 08/14/2020 3.87* 4.22 - 5.81 MIL/uL Final  . Hemoglobin 08/14/2020 11.7* 13.0 - 17.0 g/dL Final  . HCT 08/14/2020 35.7* 39.0 - 52.0 % Final  . MCV 08/14/2020 92.2  80.0 - 100.0 fL Final  . MCH 08/14/2020 30.2  26.0 - 34.0 pg Final  . MCHC 08/14/2020 32.8  30.0 - 36.0 g/dL Final  . RDW 08/14/2020 12.7  11.5 - 15.5 % Final  . Platelets 08/14/2020 206  150 - 400 K/uL Final  . nRBC 08/14/2020 0.0  0.0 - 0.2 % Final   Performed at [facility], [place] [address], [place],  [address]  . SARS Coronavirus 2 by RT PCR 08/15/2020 NEGATIVE  NEGATIVE Final   Comment: (NOTE) SARS-CoV-2 target nucleic acids are NOT DETECTED.  The SARS-CoV-2 RNA is generally detectable in upper respiratoy specimens during the acute phase of infection. The lowest concentration of SARS-CoV-2 viral copies this assay can detect is 131 copies/mL. A negative result does not preclude SARS-Cov-2 infection and should not be used as the sole basis for treatment or other patient management decisions. A negative result may occur with  improper specimen collection/handling, submission of specimen other than nasopharyngeal swab, presence of viral  mutation(s) within the areas targeted by this assay, and inadequate number of viral copies (<131 copies/mL). A negative result must be combined with clinical observations, patient history, and epidemiological information. The expected result is Negative.  Fact Sheet for Patients:  PinkCheek.be  Fact Sheet for Healthcare Providers:  GravelBags.it  This test is no                          t yet approved or cleared by the [place] FDA and  has been authorized for detection and/or diagnosis of SARS-CoV-2 by FDA under an Emergency Use Authorization (EUA). This EUA will remain  in effect (meaning this test can be used) for the duration of the COVID-19 declaration under Section 564(b)(1) of the Act, 21 U.S.C. section 360bbb-3(b)(1), unless the authorization  is terminated or revoked sooner.    . Influenza A by PCR 08/15/2020 NEGATIVE  NEGATIVE Final  . Influenza B by PCR 08/15/2020 NEGATIVE  NEGATIVE Final   Comment: (NOTE) The Xpert Xpress SARS-CoV-2/FLU/RSV assay is intended as an aid in  the diagnosis of influenza from Nasopharyngeal swab specimens and  should not be used as a sole basis for treatment. Nasal washings and  aspirates are unacceptable for Xpert Xpress SARS-CoV-2/FLU/RSV  testing.  Fact Sheet for Patients: PinkCheek.be  Fact Sheet for Healthcare Providers: GravelBags.it  This test is not yet approved or cleared by the [place] FDA and  has been authorized for detection and/or diagnosis of SARS-CoV-2 by  FDA under an Emergency Use Authorization (EUA). This EUA will remain  in effect (meaning this test can be used) for the duration of the  Covid-19 declaration under Section 564(b)(1) of the Act, 21  U.S.C. section 360bbb-3(b)(1), unless the authorization is  terminated or revoked. Performed at [facility], [place] [address], [place], [place] [address]   Admission on 08/09/2020, Discharged on 08/10/2020  Component Date Value Ref Range Status  . Sodium 08/09/2020 136  135 - 145 mmol/L Final  . Potassium 08/09/2020 3.8  3.5 - 5.1 mmol/L Final  . Chloride 08/09/2020 99  98 - 111 mmol/L Final  . CO2 08/09/2020 26  22 - 32 mmol/L Final  . Glucose, Bld 08/09/2020 94  70 - 99 mg/dL Final   Glucose reference range applies only to samples taken after fasting for at least 8 hours.  . BUN 08/09/2020 17  6 - 20 mg/dL Final  . Creatinine, Ser 08/09/2020 1.37* 0.61 - 1.24 mg/dL Final  . Calcium 08/09/2020 9.6  8.9 - 10.3 mg/dL Final  . Total Protein 08/09/2020 8.0  6.5 - 8.1 g/dL Final  . Albumin 08/09/2020 4.2  3.5 - 5.0 g/dL Final  . AST 08/09/2020 42* 15 - 41 U/L Final  . ALT 08/09/2020 41  0 - 44 U/L Final  . Alkaline Phosphatase 08/09/2020 89  38 - 126 U/L Final  . Total Bilirubin 08/09/2020 0.6  0.3 - 1.2 mg/dL Final  . GFR, Estimated 08/09/2020 >60  >60 mL/min Final  . Anion gap 08/09/2020 11  5 - 15 Final   Performed at [place] [address], [place], [place] [address]  . Alcohol, Ethyl (B) 08/09/2020 <10  <10 mg/dL Final   Comment: (NOTE) Lowest detectable limit for serum alcohol is 10 mg/dL.  For medical purposes only. Performed at [facility], [place] [address], [place], [place] [address]   . Salicylate Lvl 37/62/8315 <7.0* 7.0 - 30.0 mg/dL Final   Performed at [place] [address], [place], [place] [address]  . Acetaminophen (Tylenol), Serum 08/09/2020 <10* 10 - 30 ug/mL Final   Comment: (NOTE) Therapeutic concentrations vary significantly. A range of 10-30 ug/mL  may be an effective concentration for many patients. However, some  are best treated at concentrations outside of this range. Acetaminophen concentrations >150 ug/mL at 4 hours after ingestion  and >50 ug/mL at 12 hours after ingestion are often associated with  toxic reactions.  Performed at [facility], [place] [address], [place], [place] [address]   . WBC 08/09/2020 10.9* 4.0 - 10.5 K/uL Final  . RBC 08/09/2020 4.26  4.22 - 5.81 MIL/uL Final  . Hemoglobin 08/09/2020 12.9* 13.0 - 17.0 g/dL Final  . HCT 08/09/2020 39.7  39.0 - 52.0 %  Final  . MCV 08/09/2020 93.2  80.0 - 100.0 fL Final  . MCH 08/09/2020 30.3  26.0 - 34.0 pg Final  . MCHC 08/09/2020 32.5  30.0 - 36.0 g/dL Final  . RDW 08/09/2020 12.7  11.5 - 15.5 % Final  . Platelets 08/09/2020 282  150 - 400 K/uL Final  . nRBC 08/09/2020 0.0  0.0 - 0.2 % Final   Performed at [facility], [place] [address] [place], [place] [address]  . Opiates 08/09/2020 NONE DETECTED  NONE DETECTED Final  . Cocaine 08/09/2020 NONE DETECTED  NONE DETECTED Final  . Benzodiazepines 08/09/2020 NONE DETECTED  NONE DETECTED Final  . Amphetamines 08/09/2020 NONE DETECTED  NONE DETECTED Final  . Tetrahydrocannabinol 08/09/2020 NONE DETECTED  NONE DETECTED Final  . Barbiturates 08/09/2020 NONE DETECTED  NONE DETECTED Final   Comment: (NOTE) DRUG SCREEN FOR MEDICAL PURPOSES ONLY.  IF CONFIRMATION IS NEEDED FOR ANY PURPOSE, NOTIFY LAB WITHIN 5 DAYS.  LOWEST DETECTABLE LIMITS FOR URINE DRUG SCREEN Drug Class                     Cutoff (ng/mL) Amphetamine and metabolites    1000 Barbiturate and metabolites    200 Benzodiazepine                 614 Tricyclics and metabolites     300 Opiates and metabolites        300 Cocaine and metabolites        300 THC                            50 Performed at [facility], [place] [address], [place], [place] [address]   Admission on 08/09/2020, Discharged on 08/09/2020  Component Date Value Ref Range Status  . SARS Coronavirus 2 by RT PCR 08/09/2020 NEGATIVE  NEGATIVE Final   Comment: (NOTE) SARS-CoV-2 target nucleic acids are NOT DETECTED.  The SARS-CoV-2 RNA is generally detectable in upper respiratoy specimens during the acute phase of infection. The lowest concentration of SARS-CoV-2 viral  copies this assay can detect is 131 copies/mL. A negative result does not preclude SARS-Cov-2 infection and should not be used as the sole basis for treatment or other patient management decisions. A negative result may occur with  improper specimen collection/handling, submission of specimen other than nasopharyngeal swab, presence of viral mutation(s) within the areas targeted by this assay, and inadequate number of viral copies (<131 copies/mL). A negative result must be combined with clinical observations, patient history, and epidemiological information. The expected result is Negative.  Fact Sheet for Patients:  PinkCheek.be  Fact Sheet for Healthcare Providers:  GravelBags.it  This test is no                          t yet approved or cleared by the [place] FDA and  has been authorized for detection and/or diagnosis of SARS-CoV-2 by FDA under an Emergency Use Authorization (EUA). This EUA will remain  in effect (meaning this test can be used) for the duration of the COVID-19 declaration under Section 564(b)(1) of the Act, 21 U.S.C. section 360bbb-3(b)(1), unless the authorization is terminated or revoked sooner.    . Influenza A by PCR 08/09/2020 NEGATIVE  NEGATIVE Final  . Influenza B by PCR 08/09/2020 NEGATIVE  NEGATIVE Final   Comment: (NOTE) The Xpert Xpress SARS-CoV-2/FLU/RSV assay is intended as an  aid in  the diagnosis of influenza from Nasopharyngeal swab specimens and  should not be used as a sole basis for treatment. Nasal washings and  aspirates are unacceptable for Xpert Xpress SARS-CoV-2/FLU/RSV  testing.  Fact Sheet for Patients: PinkCheek.be  Fact Sheet for Healthcare Providers: GravelBags.it  This test is not yet approved or cleared by the [place] FDA and  has been authorized for detection and/or diagnosis of SARS-CoV-2 by   FDA under an Emergency Use Authorization (EUA). This EUA will remain  in effect (meaning this test can be used) for the duration of the  Covid-19 declaration under Section 564(b)(1) of the Act, 21  U.S.C. section 360bbb-3(b)(1), unless the authorization is  terminated or revoked. Performed at [facility], [place] [address] [place], [place] [address]   Admission on 07/02/2020, Discharged on 07/04/2020  Component Date Value Ref Range Status  . Sodium 07/01/2020 136  135 - 145 mmol/L Final  . Potassium 07/01/2020 4.0  3.5 - 5.1 mmol/L Final  . Chloride 07/01/2020 101  98 - 111 mmol/L Final  . CO2 07/01/2020 17* 22 - 32 mmol/L Final  . Glucose, Bld 07/01/2020 77  70 - 99 mg/dL Final   Glucose reference range applies only to samples taken after fasting for at least 8 hours.  . BUN 07/01/2020 42* 6 - 20 mg/dL Final  . Creatinine, Ser 07/01/2020 1.86* 0.61 - 1.24 mg/dL Final  . Calcium 07/01/2020 9.9  8.9 - 10.3 mg/dL Final  . GFR calc non Af Amer 07/01/2020 46* >60 mL/min Final  . GFR calc Af Amer 07/01/2020 53* >60 mL/min Final  . Anion gap 07/01/2020 18* 5 - 15 Final   Performed at [facility], [place] [address] [place], [place] [address]  . WBC 07/01/2020 13.2* 4.0 - 10.5 K/uL Final  . RBC 07/01/2020 5.25  4.22 - 5.81 MIL/uL Final  . Hemoglobin 07/01/2020 16.3  13.0 - 17.0 g/dL Final  . HCT 07/01/2020 48.0  39.0 - 52.0 % Final  . MCV 07/01/2020 91.4  80.0 - 100.0 fL Final  . MCH 07/01/2020 31.0  26.0 - 34.0 pg Final  . MCHC 07/01/2020 34.0  30.0 - 36.0 g/dL Final  . RDW 07/01/2020 13.2  11.5 - 15.5 % Final  . Platelets 07/01/2020 294  150 - 400 K/uL Final  . nRBC 07/01/2020 0.0  0.0 - 0.2 % Final   Performed at [facility], [place] [address], [place], [place] [address]  . Troponin I (High Sensitivity) 07/01/2020 67* <18 ng/L Final   Comment: (NOTE) Elevated high sensitivity troponin I (hsTnI) values and significant  changes across serial  measurements may suggest ACS but many other  chronic and acute conditions are known to elevate hsTnI results.  Refer to the "Links" section for chest pain algorithms and additional  guidance. Performed at [facility], [place] [address] [place], [place] [address]   . Color, Urine 07/01/2020 YELLOW  YELLOW Final  . APPearance 07/01/2020 HAZY* CLEAR Final  . Specific Gravity, Urine 07/01/2020 1.028  1.005 - 1.030 Final  . pH 07/01/2020 5.0  5.0 - 8.0 Final  . Glucose, UA 07/01/2020 NEGATIVE  NEGATIVE mg/dL Final  . Hgb urine dipstick 07/01/2020 NEGATIVE  NEGATIVE Final  . Bilirubin Urine 07/01/2020 NEGATIVE  NEGATIVE Final  . Ketones, ur 07/01/2020 5* NEGATIVE mg/dL Final  . Protein, ur 07/01/2020 >=300* NEGATIVE mg/dL Final  . Nitrite 07/01/2020 NEGATIVE  NEGATIVE Final  . Chalmers Guest 07/01/2020  NEGATIVE  NEGATIVE Final  . RBC / HPF 07/01/2020 0-5  0 - 5 RBC/hpf Final  . WBC, UA 07/01/2020 0-5  0 - 5 WBC/hpf Final  . Bacteria, UA 07/01/2020 FEW* NONE SEEN Final  . Squamous Epithelial / LPF 07/01/2020 0-5  0 - 5 Final  . Mucus 07/01/2020 PRESENT   Final  . Hyaline Casts, UA 07/01/2020 PRESENT   Final  . Granular Casts, UA 07/01/2020 PRESENT   Final  . Sperm, UA 07/01/2020 PRESENT   Final   Performed at [facility], [place] [address] [place], [place] [address]  . Troponin I (High Sensitivity) 07/02/2020 68* <18 ng/L Final   Comment: (NOTE) Elevated high sensitivity troponin I (hsTnI) values and significant  changes across serial measurements may suggest ACS but many other  chronic and acute conditions are known to elevate hsTnI results.  Refer to the "Links" section for chest pain algorithms and additional  guidance. Performed at [facility], [place] [address] [place], [place] [address]   . Total Protein 07/02/2020 8.5* 6.5 - 8.1 g/dL Final  . Albumin 07/02/2020 4.8  3.5 - 5.0 g/dL Final  . AST 07/02/2020 59* 15 - 41 U/L Final   . ALT 07/02/2020 56* 0 - 44 U/L Final  . Alkaline Phosphatase 07/02/2020 77  38 - 126 U/L Final  . Total Bilirubin 07/02/2020 1.3* 0.3 - 1.2 mg/dL Final  . Bilirubin, Direct 07/02/2020 0.2  0.0 - 0.2 mg/dL Final  . Indirect Bilirubin 07/02/2020 1.1* 0.3 - 0.9 mg/dL Final   Performed at [facility], [place] [address] [place], [place] [address]  . Total CK 07/02/2020 692* 49 - 397 U/L Final   Performed at [facility], [place] [address], [place], [place] [address]  . SARS Coronavirus 2 07/02/2020 NEGATIVE  NEGATIVE Final   Comment: (NOTE) SARS-CoV-2 target nucleic acids are NOT DETECTED.  The SARS-CoV-2 RNA is generally detectable in upper and lower respiratory specimens during the acute phase of infection. The lowest concentration of SARS-CoV-2 viral copies this assay can detect is 250 copies / mL. A negative result does not preclude SARS-CoV-2 infection and should not be used as the sole basis for treatment or other patient management decisions.  A negative result may occur with improper specimen collection / handling, submission of specimen other than nasopharyngeal swab, presence of viral mutation(s) within the areas targeted by this assay, and inadequate number of viral copies (<250 copies / mL). A negative result must be combined with clinical observations, patient history, and epidemiological information.  Fact Sheet for Patients:   StrictlyIdeas.no  Fact Sheet for Healthcare Providers: BankingDealers.co.za  This test is not yet approved or                           cleared by the [place] FDA and has been authorized for detection and/or diagnosis of SARS-CoV-2 by FDA under an Emergency Use Authorization (EUA).  This EUA will remain in effect (meaning this test can be used) for the duration of the COVID-19 declaration under Section 564(b)(1) of the Act, 21 U.S.C. section 360bbb-3(b)(1),  unless the authorization is terminated or revoked sooner.  Performed at [facility], [place] [address] [place], [place] [address]   . Sed Rate 07/02/2020 2  0 - 16 mm/hr Final   Performed at [facility], [place] [address], [place], [place] [address]  . WBC 07/02/2020 14.1*  4.0 - 10.5 K/uL Final  . RBC 07/02/2020 4.39  4.22 - 5.81 MIL/uL Final  . Hemoglobin 07/02/2020 13.4  13.0 - 17.0 g/dL Final  . HCT 07/02/2020 40.3  39.0 - 52.0 % Final  . MCV 07/02/2020 91.8  80.0 - 100.0 fL Final  . MCH 07/02/2020 30.5  26.0 - 34.0 pg Final  . MCHC 07/02/2020 33.3  30.0 - 36.0 g/dL Final  . RDW 07/02/2020 13.2  11.5 - 15.5 % Final  . Platelets 07/02/2020 238  150 - 400 K/uL Final  . nRBC 07/02/2020 0.0  0.0 - 0.2 % Final   Performed at [facility], [place] [address], [place], [place] [address]  . Creatinine, Ser 07/02/2020 1.22  0.61 - 1.24 mg/dL Final  . GFR calc non Af Amer 07/02/2020 >60  >60 mL/min Final  . GFR calc Af Amer 07/02/2020 >60  >60 mL/min Final   Performed at [place] [address] [place], [place] [address]  . Troponin I (High Sensitivity) 07/02/2020 78* <18 ng/L Final   Comment: (NOTE) Elevated high sensitivity troponin I (hsTnI) values and significant  changes across serial measurements may suggest ACS but many other  chronic and acute conditions are known to elevate hsTnI results.  Refer to the "Links" section for chest pain algorithms and additional  guidance. Performed at [facility], [place] [address], [place], [place] [address]   . BP 07/02/2020 106/67  mmHg Final  . S' Lateral 07/02/2020 2.55  cm Final  . WBC 07/04/2020 9.0  4.0 - 10.5 K/uL Final  . RBC 07/04/2020 4.27  4.22 - 5.81 MIL/uL Final  . Hemoglobin 07/04/2020 13.2  13.0 - 17.0 g/dL Final  . HCT 07/04/2020 39.2  39.0 - 52.0 % Final  . MCV 07/04/2020 91.8  80.0 - 100.0 fL Final  . MCH 07/04/2020 30.9  26.0 - 34.0  pg Final  . MCHC 07/04/2020 33.7  30.0 - 36.0 g/dL Final  . RDW 07/04/2020 13.3  11.5 - 15.5 % Final  . Platelets 07/04/2020 218  150 - 400 K/uL Final  . nRBC 07/04/2020 0.0  0.0 - 0.2 % Final  . Neutrophils Relative % 07/04/2020 49  % Final  . Neutro Abs 07/04/2020 4.5  1.7 - 7.7 K/uL Final  . Lymphocytes Relative 07/04/2020 38  % Final  . Lymphs Abs 07/04/2020 3.4  0.7 - 4.0 K/uL Final  . Monocytes Relative 07/04/2020 10  % Final  . Monocytes Absolute 07/04/2020 0.9  0.1 - 1.0 K/uL Final  . Eosinophils Relative 07/04/2020 2  % Final  . Eosinophils Absolute 07/04/2020 0.2  0.0 - 0.5 K/uL Final  . Basophils Relative 07/04/2020 1  % Final  . Basophils Absolute 07/04/2020 0.1  0.0 - 0.1 K/uL Final  . Immature Granulocytes 07/04/2020 0  % Final  . Abs Immature Granulocytes 07/04/2020 0.04  0.00 - 0.07 K/uL Final   Performed at [facility], [place] [address] [place], [place] [address]  . Sodium 07/04/2020 139  135 - 145 mmol/L Final  . Potassium 07/04/2020 4.1  3.5 - 5.1 mmol/L Final  . Chloride 07/04/2020 106  98 - 111 mmol/L Final  . CO2 07/04/2020 24  22 - 32 mmol/L Final  . Glucose, Bld 07/04/2020 91  70 - 99 mg/dL Final   Glucose reference range applies only to samples taken after fasting for at least 8 hours.  . BUN 07/04/2020 18  6 - 20 mg/dL Final  .  Creatinine, Ser 07/04/2020 1.05  0.61 - 1.24 mg/dL Final  . Calcium 07/04/2020 8.7* 8.9 - 10.3 mg/dL Final  . GFR calc non Af Amer 07/04/2020 >60  >60 mL/min Final  . GFR calc Af Amer 07/04/2020 >60  >60 mL/min Final  . Anion gap 07/04/2020 9  5 - 15 Final   Performed at [facility] [place] [address] [place], [place] [address]  Admission on 06/19/2020, Discharged on 06/21/2020  Component Date Value Ref Range Status  . Sodium 06/19/2020 138  135 - 145 mmol/L Final  . Potassium 06/19/2020 3.7  3.5 - 5.1 mmol/L Final  . Chloride 06/19/2020 101  98 - 111 mmol/L Final  . CO2 06/19/2020 23  22 - 32  mmol/L Final  . Glucose, Bld 06/19/2020 124* 70 - 99 mg/dL Final   Glucose reference range applies only to samples taken after fasting for at least 8 hours.  . BUN 06/19/2020 14  6 - 20 mg/dL Final  . Creatinine, Ser 06/19/2020 2.61* 0.61 - 1.24 mg/dL Final  . Calcium 06/19/2020 9.8  8.9 - 10.3 mg/dL Final  . Total Protein 06/19/2020 8.2* 6.5 - 8.1 g/dL Final  . Albumin 06/19/2020 4.6  3.5 - 5.0 g/dL Final  . AST 06/19/2020 39  15 - 41 U/L Final  . ALT 06/19/2020 35  0 - 44 U/L Final  . Alkaline Phosphatase 06/19/2020 79  38 - 126 U/L Final  . Total Bilirubin 06/19/2020 0.5  0.3 - 1.2 mg/dL Final  . GFR calc non Af Amer 06/19/2020 30* >60 mL/min Final  . GFR calc Af Amer 06/19/2020 35* >60 mL/min Final  . Anion gap 06/19/2020 14  5 - 15 Final   Performed at [facility], [place] [address], [place], [place] [address]  . Alcohol, Ethyl (B) 06/19/2020 <10  <10 mg/dL Final   Comment: (NOTE) Lowest detectable limit for serum alcohol is 10 mg/dL.  For medical purposes only. Performed at [facility], [place] [address] [place], [place] [address]   . WBC 06/19/2020 19.7* 4.0 - 10.5 K/uL Final  . RBC 06/19/2020 4.75  4.22 - 5.81 MIL/uL Final  . Hemoglobin 06/19/2020 14.8  13.0 - 17.0 g/dL Final  . HCT 06/19/2020 45.3  39.0 - 52.0 % Final  . MCV 06/19/2020 95.4  80.0 - 100.0 fL Final  . MCH 06/19/2020 31.2  26.0 - 34.0 pg Final  . MCHC 06/19/2020 32.7  30.0 - 36.0 g/dL Final  . RDW 06/19/2020 13.9  11.5 - 15.5 % Final  . Platelets 06/19/2020 300  150 - 400 K/uL Final  . nRBC 06/19/2020 0.0  0.0 - 0.2 % Final   Performed at [facility], [place] [address] [place], [place] [address]  . Opiates 06/19/2020 NONE DETECTED  NONE DETECTED Final  . Cocaine 06/19/2020 NONE DETECTED  NONE DETECTED Final  . Benzodiazepines 06/19/2020 NONE DETECTED  NONE DETECTED Final  . Amphetamines 06/19/2020 POSITIVE* NONE DETECTED Final  . Tetrahydrocannabinol 06/19/2020 NONE DETECTED  NONE DETECTED  Final  . Barbiturates 06/19/2020 NONE DETECTED  NONE DETECTED Final   Comment: (NOTE) DRUG SCREEN FOR MEDICAL PURPOSES ONLY.  IF CONFIRMATION IS NEEDED FOR ANY PURPOSE, NOTIFY LAB WITHIN 5 DAYS.  LOWEST DETECTABLE LIMITS FOR URINE DRUG SCREEN Drug Class                     Cutoff (ng/mL) Amphetamine and metabolites    1000 Barbiturate and metabolites  200 Benzodiazepine                 092 Tricyclics and metabolites     300 Opiates and metabolites        300 Cocaine and metabolites        300 THC                            50 Performed at [facility], [place] [address], [place], [place] [address]   . Color, Urine 06/19/2020 YELLOW  YELLOW Final  . APPearance 06/19/2020 HAZY* CLEAR Final  . Specific Gravity, Urine 06/19/2020 1.024  1.005 - 1.030 Final  . pH 06/19/2020 5.0  5.0 - 8.0 Final  . Glucose, UA 06/19/2020 NEGATIVE  NEGATIVE mg/dL Final  . Hgb urine dipstick 06/19/2020 NEGATIVE  NEGATIVE Final  . Bilirubin Urine 06/19/2020 NEGATIVE  NEGATIVE Final  . Ketones, ur 06/19/2020 20* NEGATIVE mg/dL Final  . Protein, ur 06/19/2020 100* NEGATIVE mg/dL Final  . Nitrite 06/19/2020 NEGATIVE  NEGATIVE Final  . Chalmers Guest 06/19/2020 NEGATIVE  NEGATIVE Final  . RBC / HPF 06/19/2020 0-5  0 - 5 RBC/hpf Final  . WBC, UA 06/19/2020 0-5  0 - 5 WBC/hpf Final  . Bacteria, UA 06/19/2020 NONE SEEN  NONE SEEN Final  . Mucus 06/19/2020 PRESENT   Final   Performed at [facility], [place] [address] [place], [place] [address]  . Specimen Description 06/19/2020 URINE, RANDOM   Final  . Special Requests 06/19/2020 NONE   Final  . Culture 06/19/2020    Final                   Value:NO GROWTH Performed at [facility], [place] [address], [place], [place] [address]   . Report Status 06/19/2020 06/20/2020 FINAL   Final  . Lipase 06/19/2020 26  11 - 51 U/L Final   Performed at [facility], [place] [address] [place], [place] [address]  . Lactic Acid, Venous 06/19/2020 1.3  0.5 -  1.9 mmol/L Final   Performed at [facility], [place] [address], [place], [place] [address]  . Lactic Acid, Venous 06/19/2020 1.8  0.5 - 1.9 mmol/L Final   Performed at [facility], [place] [address] [place], [place] [address]  . Specimen Description 06/19/2020 BLOOD LEFT ANTECUBITAL   Final  . Special Requests 06/19/2020 BOTTLES DRAWN AEROBIC AND ANAEROBIC Blood Culture results may not be optimal due to an inadequate volume of blood received in culture bottles   Final  . Culture 06/19/2020    Final                   Value:NO GROWTH 5 DAYS Performed at [facility], [place] [address], [place], [place] [address]   . Report Status 06/19/2020 06/24/2020 FINAL   Final  . Specimen Description 06/19/2020 BLOOD LEFT HAND   Final  . Special Requests 06/19/2020 BOTTLES DRAWN AEROBIC ONLY Blood Culture results may not be optimal due to an inadequate volume of blood received in culture bottles   Final  . Culture 06/19/2020    Final                   Value:NO GROWTH 5 DAYS Performed at [facility], [place] [address], [place],  [address]   . Report Status 06/19/2020 06/24/2020 FINAL   Final  . Troponin  I (High Sensitivity) 06/19/2020 2,414* <18 ng/L Final   Comment: CRITICAL RESULT CALLED TO, READ BACK BY AND VERIFIED WITH: E.[email or web address],RN  [id number] 06/19/20 CLARK,S (NOTE) Elevated high sensitivity troponin I (hsTnI) values and significant  changes across serial measurements may suggest ACS but many other  chronic and acute conditions are known to elevate hsTnI results.  Refer to the Links section for chest pain algorithms and additional  guidance. Performed at [facility], [place] [address] [place], [place] [address]   . SARS Coronavirus 2 06/19/2020 NEGATIVE  NEGATIVE Final   Comment: (NOTE) SARS-CoV-2 target nucleic acids are NOT DETECTED.  The SARS-CoV-2 RNA is generally detectable in upper and lower respiratory specimens during the acute phase of infection. The  lowest concentration of SARS-CoV-2 viral copies this assay can detect is 250 copies / mL. A negative result does not preclude SARS-CoV-2 infection and should not be used as the sole basis for treatment or other patient management decisions.  A negative result may occur with improper specimen collection / handling, submission of specimen other than nasopharyngeal swab, presence of viral mutation(s) within the areas targeted by this assay, and inadequate number of viral copies (<250 copies / mL). A negative result must be combined with clinical observations, patient history, and epidemiological information.  Fact Sheet for Patients:   StrictlyIdeas.no  Fact Sheet for Healthcare Providers: BankingDealers.co.za  This test is not yet approved or                           cleared by the [place] FDA and has been authorized for detection and/or diagnosis of SARS-CoV-2 by FDA under an Emergency Use Authorization (EUA).  This EUA will remain in effect (meaning this test can be used) for the duration of the COVID-19 declaration under Section 564(b)(1) of the Act, 21 U.S.C. section 360bbb-3(b)(1), unless the authorization is terminated or revoked sooner.  Performed at [facility], [place] [address], [place], [place] [address]   . Total CK 06/19/2020 909* 49 - 397 U/L Final   Performed at [facility], [place] [address] [place], [place] [address]  . Troponin I (High Sensitivity) 06/19/2020 1,778* <18 ng/L Final   Comment: CRITICAL VALUE NOTED.  VALUE IS CONSISTENT WITH PREVIOUSLY REPORTED AND CALLED VALUE. (NOTE) Elevated high sensitivity troponin I (hsTnI) values and significant  changes across serial measurements may suggest ACS but many other  chronic and acute conditions are known to elevate hsTnI results.  Refer to the Links section for chest pain algorithms and additional  guidance. Performed at [facility], [place] [address] [place], [place] [address]   . Heparin Unfractionated 06/19/2020 0.15* 0.30 - 0.70 IU/mL Final   Comment: (NOTE) If heparin results are below expected values, and patient dosage has  been confirmed, suggest follow up testing of antithrombin III levels. Performed at [facility], [place] [address], [place], [place] [address]   . Sodium, Ur 06/19/2020 84  mmol/L Final   Performed at [place] [address], [place], [place] [address]  . Creatinine, Urine 06/19/2020 366.96  mg/dL Final   Performed at [facility] [address], [place], [place] [address]  . Total CK 06/19/2020 1,022* 49 - 397 U/L Final   Performed at [facility], [place] [address], [place], [place] [address]  . Hgb A1c MFr Bld 06/19/2020 5.3  4.8 - 5.6 % Final  Comment: (NOTE)         Prediabetes: 5.7 - 6.4         Diabetes: >6.4         Glycemic control for adults with diabetes: <7.0   . Mean Plasma Glucose 06/19/2020 105  mg/dL Final   Comment: (NOTE) Performed At: [facility] [place], [place] [id number] Rush Farmer MD [id number]   . Troponin I (High Sensitivity) 06/19/2020 2,148* <18 ng/L Final   Comment: CRITICAL VALUE NOTED.  VALUE IS CONSISTENT WITH PREVIOUSLY REPORTED AND CALLED VALUE. (NOTE) Elevated high sensitivity troponin I (hsTnI) values and significant  changes across serial measurements may suggest ACS but many other  chronic and acute conditions are known to elevate hsTnI results.  Refer to the Links section for chest pain algorithms and additional  guidance. Performed at [facility], [place] [address], [place], [place] [address]   . Weight 06/19/2020 2,640  oz Final  . Height 06/19/2020 69  in Final  . BP 06/19/2020 125/85  mmHg Final  . S' Lateral 06/19/2020 3.45  cm Final  . Area-P 1/2 06/19/2020 3.85  cm2 Final  . HIV Screen 4th Generation wRfx 06/19/2020 Non Reactive  Non Reactive Final   Performed at [place], [place] [address], [place], [place] [address]  . RPR Ser Ql 06/19/2020 NON REACTIVE  NON REACTIVE Final   Performed at [facility], [place] [address], [place], [place] [address]  . Neutrophils Relative % 06/19/2020 61  % Final  . Neutro Abs 06/19/2020 7.8* 1.7 - 7.7 K/uL Final  . Lymphocytes Relative 06/19/2020 29  % Final  . Lymphs Abs 06/19/2020 3.7  0.7 - 4.0 K/uL Final  . Monocytes Relative 06/19/2020 8  % Final  . Monocytes Absolute 06/19/2020 1.0  0.1 - 1.0 K/uL Final  . Eosinophils Relative 06/19/2020 2  % Final  . Eosinophils Absolute 06/19/2020 0.3  0.0 - 0.5 K/uL Final  . Basophils Relative 06/19/2020 0  % Final  . Basophils Absolute 06/19/2020 0.0  0.0 - 0.1 K/uL Final  . Immature Granulocytes 06/19/2020 0  % Final  . Abs Immature Granulocytes 06/19/2020 0.05  0.00 - 0.07 K/uL Final   Performed at [facility], [place] [address], [place], [place] [address]  . WBC 06/19/2020 12.9* 4.0 - 10.5 K/uL Final  . RBC 06/19/2020 4.66  4.22 - 5.81 MIL/uL Final  . Hemoglobin 06/19/2020 14.1  13.0 - 17.0 g/dL Final  . HCT 06/19/2020 43.4  39.0 - 52.0 % Final  . MCV 06/19/2020 93.1  80.0 - 100.0 fL Final  . MCH 06/19/2020 30.3  26.0 - 34.0 pg Final  . MCHC 06/19/2020 32.5  30.0 - 36.0 g/dL Final  . RDW 06/19/2020 13.7  11.5 - 15.5 % Final  . Platelets 06/19/2020 235  150 - 400 K/uL Final  . nRBC 06/19/2020 0.0  0.0 - 0.2 % Final   Performed at [facility], [place] [address], [place], [place] [address]  . Total CK 06/20/2020 767* 49 - 397 U/L Final   Performed at [facility], [place] [address] [place], [place] [address]  . Sodium 06/20/2020 139  135 - 145 mmol/L Final  . Potassium 06/20/2020 4.6  3.5 - 5.1 mmol/L Final  . Chloride 06/20/2020 104  98 - 111 mmol/L Final  . CO2 06/20/2020 28  22 - 32 mmol/L Final  . Glucose, Bld 06/20/2020 77  70 -  99 mg/dL Final   Glucose reference range applies only to samples taken after fasting for at least 8 hours.  . BUN 06/20/2020 20   6 - 20 mg/dL Final  . Creatinine, Ser 06/20/2020 1.52* 0.61 - 1.24 mg/dL Final   DELTA CHECK NOTED  . Calcium 06/20/2020 8.8* 8.9 - 10.3 mg/dL Final  . Total Protein 06/20/2020 6.6  6.5 - 8.1 g/dL Final  . Albumin 06/20/2020 3.4* 3.5 - 5.0 g/dL Final  . AST 06/20/2020 43* 15 - 41 U/L Final  . ALT 06/20/2020 34  0 - 44 U/L Final  . Alkaline Phosphatase 06/20/2020 67  38 - 126 U/L Final  . Total Bilirubin 06/20/2020 0.8  0.3 - 1.2 mg/dL Final  . GFR calc non Af Amer 06/20/2020 59* >60 mL/min Final  . GFR calc Af Amer 06/20/2020 >60  >60 mL/min Final  . Anion gap 06/20/2020 7  5 - 15 Final   Performed at [facility], [place] [address], [place], [place] [address]  . Heparin Unfractionated 06/19/2020 <0.10* 0.30 - 0.70 IU/mL Final   Comment: (NOTE) If heparin results are below expected values, and patient dosage has  been confirmed, suggest follow up testing of antithrombin III levels. Performed at [facility], [place] [address] [place], [place] [address]   . Troponin I (High Sensitivity) 06/19/2020 1,715* <18 ng/L Final   Comment: CRITICAL VALUE NOTED.  VALUE IS CONSISTENT WITH PREVIOUSLY REPORTED AND CALLED VALUE. (NOTE) Elevated high sensitivity troponin I (hsTnI) values and significant  changes across serial measurements may suggest ACS but many other  chronic and acute conditions are known to elevate hsTnI results.  Refer to the Links section for chest pain algorithms and additional  guidance. Performed at [facility], [place] [address] [place], [place] [address]   . Heparin Unfractionated 06/20/2020 0.58  0.30 - 0.70 IU/mL Final   Comment: (NOTE) If heparin results are below expected values, and patient dosage has  been confirmed, suggest follow up testing of antithrombin III levels. Performed at [facility], [place] [address], [place], [place] [address]   . WBC 06/20/2020 11.6* 4.0 - 10.5 K/uL Final  . RBC 06/20/2020 4.58  4.22 - 5.81 MIL/uL Final  .  Hemoglobin 06/20/2020 13.7  13.0 - 17.0 g/dL Final  . HCT 06/20/2020 43.0  39.0 - 52.0 % Final  . MCV 06/20/2020 93.9  80.0 - 100.0 fL Final  . MCH 06/20/2020 29.9  26.0 - 34.0 pg Final  . MCHC 06/20/2020 31.9  30.0 - 36.0 g/dL Final  . RDW 06/20/2020 13.6  11.5 - 15.5 % Final  . Platelets 06/20/2020 240  150 - 400 K/uL Final  . nRBC 06/20/2020 0.0  0.0 - 0.2 % Final   Performed at [facility], [place] [address], [place], [place] [address]  . Heparin Unfractionated 06/20/2020 0.26* 0.30 - 0.70 IU/mL Final   Comment: (NOTE) If heparin results are below expected values, and patient dosage has  been confirmed, suggest follow up testing of antithrombin III levels. Performed at [facility], [place] [address], [place], [place] [address]   . Heparin Unfractionated 06/21/2020 0.51  0.30 - 0.70 IU/mL Final   Comment: (NOTE) If heparin results are below expected values, and patient dosage has  been confirmed, suggest follow up testing of antithrombin III levels. Performed at [facility], [place] [address], [place], [place] [address]   . WBC 06/21/2020 7.9  4.0 -  10.5 K/uL Final  . RBC 06/21/2020 4.20* 4.22 - 5.81 MIL/uL Final  . Hemoglobin 06/21/2020 12.6* 13.0 - 17.0 g/dL Final  . HCT 06/21/2020 40.6  39.0 - 52.0 % Final  . MCV 06/21/2020 96.7  80.0 - 100.0 fL Final  . MCH 06/21/2020 30.0  26.0 - 34.0 pg Final  . MCHC 06/21/2020 31.0  30.0 - 36.0 g/dL Final  . RDW 06/21/2020 13.9  11.5 - 15.5 % Final  . Platelets 06/21/2020 225  150 - 400 K/uL Final  . nRBC 06/21/2020 0.0  0.0 - 0.2 % Final   Performed at [facility], [place] [address], [place], [place] [address]  . Sodium 06/21/2020 139  135 - 145 mmol/L Final  . Potassium 06/21/2020 4.1  3.5 - 5.1 mmol/L Final  . Chloride 06/21/2020 104  98 - 111 mmol/L Final  . CO2 06/21/2020 26  22 - 32 mmol/L Final  . Glucose, Bld 06/21/2020 100* 70 - 99 mg/dL Final   Glucose reference range applies only to samples taken after  fasting for at least 8 hours.  . BUN 06/21/2020 9  6 - 20 mg/dL Final  . Creatinine, Ser 06/21/2020 1.12  0.61 - 1.24 mg/dL Final  . Calcium 06/21/2020 8.7* 8.9 - 10.3 mg/dL Final  . GFR calc non Af Amer 06/21/2020 >60  >60 mL/min Final  . GFR calc Af Amer 06/21/2020 >60  >60 mL/min Final  . Anion gap 06/21/2020 9  5 - 15 Final   Performed at [facility], [place] [address], [place], [place] [address]  Admission on 06/03/2020, Discharged on 06/04/2020  Component Date Value Ref Range Status  . Sodium 06/03/2020 141  135 - 145 mmol/L Final  . Potassium 06/03/2020 4.3  3.5 - 5.1 mmol/L Final  . Chloride 06/03/2020 103  98 - 111 mmol/L Final  . CO2 06/03/2020 26  22 - 32 mmol/L Final  . Glucose, Bld 06/03/2020 111* 70 - 99 mg/dL Final   Glucose reference range applies only to samples taken after fasting for at least 8 hours.  . BUN 06/03/2020 15  6 - 20 mg/dL Final  . Creatinine, Ser 06/03/2020 1.21  0.61 - 1.24 mg/dL Final  . Calcium 06/03/2020 10.6* 8.9 - 10.3 mg/dL Final  . Total Protein 06/03/2020 8.3* 6.5 - 8.1 g/dL Final  . Albumin 06/03/2020 4.7  3.5 - 5.0 g/dL Final  . AST 06/03/2020 37  15 - 41 U/L Final  . ALT 06/03/2020 47* 0 - 44 U/L Final  . Alkaline Phosphatase 06/03/2020 83  38 - 126 U/L Final  . Total Bilirubin 06/03/2020 0.5  0.3 - 1.2 mg/dL Final  . GFR calc non Af Amer 06/03/2020 >60  >60 mL/min Final  . GFR calc Af Amer 06/03/2020 >60  >60 mL/min Final  . Anion gap 06/03/2020 12  5 - 15 Final   Performed at [facility], [place] [address] [place], [place] [address]  . Alcohol, Ethyl (B) 06/03/2020 <10  <10 mg/dL Final   Comment: (NOTE) Lowest detectable limit for serum alcohol is 10 mg/dL.  For medical purposes only. Performed at [facility], [place] [address], [place], [place] [address]   . Salicylate Lvl 10/62/6948 <7.0* 7.0 - 30.0 mg/dL Final   Performed at [place] [address], [place],  [place] [address]  . Acetaminophen (Tylenol), Serum 06/03/2020 <10* 10 - 30 ug/mL Final   Comment: (NOTE) Therapeutic concentrations vary significantly. A  range of 10-30 ug/mL  may be an effective concentration for many patients. However, some  are best treated at concentrations outside of this range. Acetaminophen concentrations >150 ug/mL at 4 hours after ingestion  and >50 ug/mL at 12 hours after ingestion are often associated with  toxic reactions.  Performed at [facility], [place] [address], [place], [place] [address]   . WBC 06/03/2020 9.5  4.0 - 10.5 K/uL Final  . RBC 06/03/2020 4.66  4.22 - 5.81 MIL/uL Final  . Hemoglobin 06/03/2020 14.4  13.0 - 17.0 g/dL Final  . HCT 06/03/2020 43.7  39.0 - 52.0 % Final  . MCV 06/03/2020 93.8  80.0 - 100.0 fL Final  . MCH 06/03/2020 30.9  26.0 - 34.0 pg Final  . MCHC 06/03/2020 33.0  30.0 - 36.0 g/dL Final  . RDW 06/03/2020 13.7  11.5 - 15.5 % Final  . Platelets 06/03/2020 350  150 - 400 K/uL Final  . nRBC 06/03/2020 0.0  0.0 - 0.2 % Final   Performed at [facility], [place] [address] [place], [place] [address]  . Opiates 06/03/2020 NONE DETECTED  NONE DETECTED Final  . Cocaine 06/03/2020 NONE DETECTED  NONE DETECTED Final  . Benzodiazepines 06/03/2020 NONE DETECTED  NONE DETECTED Final  . Amphetamines 06/03/2020 NONE DETECTED  NONE DETECTED Final  . Tetrahydrocannabinol 06/03/2020 NONE DETECTED  NONE DETECTED Final  . Barbiturates 06/03/2020 NONE DETECTED  NONE DETECTED Final   Comment: (NOTE) DRUG SCREEN FOR MEDICAL PURPOSES ONLY.  IF CONFIRMATION IS NEEDED FOR ANY PURPOSE, NOTIFY LAB WITHIN 5 DAYS.  LOWEST DETECTABLE LIMITS FOR URINE DRUG SCREEN Drug Class                     Cutoff (ng/mL) Amphetamine and metabolites    1000 Barbiturate and metabolites    200 Benzodiazepine                 885 Tricyclics and metabolites     300 Opiates and metabolites        300 Cocaine and metabolites         300 THC                            50 Performed at [facility], [place] [address] [place], [place] [address]   . SARS Coronavirus 2 06/03/2020 NEGATIVE  NEGATIVE Final   Comment: (NOTE) SARS-CoV-2 target nucleic acids are NOT DETECTED.  The SARS-CoV-2 RNA is generally detectable in upper and lower respiratory specimens during the acute phase of infection. The lowest concentration of SARS-CoV-2 viral copies this assay can detect is 250 copies / mL. A negative result does not preclude SARS-CoV-2 infection and should not be used as the sole basis for treatment or other patient management decisions.  A negative result may occur with improper specimen collection / handling, submission of specimen other than nasopharyngeal swab, presence of viral mutation(s) within the areas targeted by this assay, and inadequate number of viral copies (<250 copies / mL). A negative result must be combined with clinical observations, patient history, and epidemiological information.  Fact Sheet for Patients:   StrictlyIdeas.no  Fact Sheet for Healthcare Providers: BankingDealers.co.za  This test is not yet approved or                           cleared by the [place] FDA  and has been authorized for detection and/or diagnosis of SARS-CoV-2 by FDA under an Emergency Use Authorization (EUA).  This EUA will remain in effect (meaning this test can be used) for the duration of the COVID-19 declaration under Section 564(b)(1) of the Act, 21 U.S.C. section 360bbb-3(b)(1), unless the authorization is terminated or revoked sooner.  Performed at [facility], [place] [address], [place], [place] [address]   Admission on 05/03/2020, Discharged on 05/04/2020  Component Date Value Ref Range Status  . Sodium 05/03/2020 136  135 - 145 mmol/L Final  . Potassium 05/03/2020 4.3  3.5 - 5.1 mmol/L Final  . Chloride 05/03/2020 103  98  - 111 mmol/L Final  . CO2 05/03/2020 23  22 - 32 mmol/L Final  . Glucose, Bld 05/03/2020 101* 70 - 99 mg/dL Final   Glucose reference range applies only to samples taken after fasting for at least 8 hours.  . BUN 05/03/2020 15  6 - 20 mg/dL Final  . Creatinine, Ser 05/03/2020 1.04  0.61 - 1.24 mg/dL Final  . Calcium 05/03/2020 9.4  8.9 - 10.3 mg/dL Final  . Total Protein 05/03/2020 7.3  6.5 - 8.1 g/dL Final  . Albumin 05/03/2020 3.9  3.5 - 5.0 g/dL Final  . AST 05/03/2020 18  15 - 41 U/L Final  . ALT 05/03/2020 28  0 - 44 U/L Final  . Alkaline Phosphatase 05/03/2020 76  38 - 126 U/L Final  . Total Bilirubin 05/03/2020 0.5  0.3 - 1.2 mg/dL Final  . GFR calc non Af Amer 05/03/2020 >60  >60 mL/min Final  . GFR calc Af Amer 05/03/2020 >60  >60 mL/min Final  . Anion gap 05/03/2020 10  5 - 15 Final   Performed at [place] [address] [place], [place] [address]  . Alcohol, Ethyl (B) 05/03/2020 <10  <10 mg/dL Final   Comment: (NOTE) Lowest detectable limit for serum alcohol is 10 mg/dL.  For medical purposes only. Performed at [facility], [place] [address], [place], [place] [address]   . Salicylate Lvl 35/59/7416 <7.0* 7.0 - 30.0 mg/dL Final   Performed at [place] [address], [place], [place] [address]  . Acetaminophen (Tylenol), Serum 05/03/2020 <10* 10 - 30 ug/mL Final   Comment: (NOTE) Therapeutic concentrations vary significantly. A range of 10-30 ug/mL  may be an effective concentration for many patients. However, some  are best treated at concentrations outside of this range. Acetaminophen concentrations >150 ug/mL at 4 hours after ingestion  and >50 ug/mL at 12 hours after ingestion are often associated with  toxic reactions.  Performed at [facility], [place] [address] [place], [place] [address]   . WBC 05/03/2020 10.4  4.0 - 10.5 K/uL Final  . RBC 05/03/2020 4.82  4.22 - 5.81 MIL/uL Final  . Hemoglobin 05/03/2020 15.1  13.0 - 17.0  g/dL Final  . HCT 05/03/2020 45.0  39.0 - 52.0 % Final  . MCV 05/03/2020 93.4  80.0 - 100.0 fL Final  . MCH 05/03/2020 31.3  26.0 - 34.0 pg Final  . MCHC 05/03/2020 33.6  30.0 - 36.0 g/dL Final  . RDW 05/03/2020 13.2  11.5 - 15.5 % Final  . Platelets 05/03/2020 270  150 - 400 K/uL Final  . nRBC 05/03/2020 0.0  0.0 - 0.2 % Final   Performed at [facility], [place] [address] [place], [place] [address]  . Opiates 05/03/2020 NONE DETECTED  NONE DETECTED Final  . Cocaine 05/03/2020 NONE DETECTED  NONE DETECTED Final  . Benzodiazepines 05/03/2020 NONE DETECTED  NONE DETECTED Final  . Amphetamines 05/03/2020 POSITIVE* NONE DETECTED Final  . Tetrahydrocannabinol 05/03/2020 NONE DETECTED  NONE DETECTED Final  . Barbiturates 05/03/2020 NONE DETECTED  NONE DETECTED Final   Comment: (NOTE) DRUG SCREEN FOR MEDICAL PURPOSES ONLY.  IF CONFIRMATION IS NEEDED FOR ANY PURPOSE, NOTIFY LAB WITHIN 5 DAYS.  LOWEST DETECTABLE LIMITS FOR URINE DRUG SCREEN Drug Class                     Cutoff (ng/mL) Amphetamine and metabolites    1000 Barbiturate and metabolites    200 Benzodiazepine                 163 Tricyclics and metabolites     300 Opiates and metabolites        300 Cocaine and metabolites        300 THC                            50 Performed at [facility], [place] [address], [place], [place] [address]   Admission on 05/02/2020, Discharged on 05/02/2020  Component Date Value Ref Range Status  . Sodium 05/02/2020 139  135 - 145 mmol/L Final  . Potassium 05/02/2020 4.4  3.5 - 5.1 mmol/L Final  . Chloride 05/02/2020 103  98 - 111 mmol/L Final  . CO2 05/02/2020 26  22 - 32 mmol/L Final  . Glucose, Bld 05/02/2020 96  70 - 99 mg/dL Final   Glucose reference range applies only to samples taken after fasting for at least 8 hours.  . BUN 05/02/2020 13  6 - 20 mg/dL Final  . Creatinine, Ser 05/02/2020 1.03  0.61 - 1.24 mg/dL Final  . Calcium 05/02/2020 9.3  8.9 - 10.3 mg/dL Final  .  Total Protein 05/02/2020 7.8  6.5 - 8.1 g/dL Final  . Albumin 05/02/2020 4.3  3.5 - 5.0 g/dL Final  . AST 05/02/2020 20  15 - 41 U/L Final  . ALT 05/02/2020 33  0 - 44 U/L Final  . Alkaline Phosphatase 05/02/2020 81  38 - 126 U/L Final  . Total Bilirubin 05/02/2020 0.6  0.3 - 1.2 mg/dL Final  . GFR calc non Af Amer 05/02/2020 >60  >60 mL/min Final  . GFR calc Af Amer 05/02/2020 >60  >60 mL/min Final  . Anion gap 05/02/2020 10  5 - 15 Final   Performed at [facility], [place] [address], [place], [place] [address]  . Alcohol, Ethyl (B) 05/02/2020 <10  <10 mg/dL Final   Comment: (NOTE) Lowest detectable limit for serum alcohol is 10 mg/dL.  For medical purposes only. Performed at [facility], [place] [address] [place], [place] [address]   . WBC 05/02/2020 8.9  4.0 - 10.5 K/uL Final  . RBC 05/02/2020 4.83  4.22 - 5.81 MIL/uL Final  . Hemoglobin 05/02/2020 14.7  13.0 - 17.0 g/dL Final  . HCT 05/02/2020 45.0  39.0 - 52.0 % Final  . MCV 05/02/2020 93.2  80.0 - 100.0 fL Final  . MCH 05/02/2020 30.4  26.0 - 34.0 pg Final  . MCHC 05/02/2020 32.7  30.0 - 36.0 g/dL Final  . RDW 05/02/2020 13.3  11.5 - 15.5 % Final  . Platelets 05/02/2020 263  150 - 400 K/uL Final  . nRBC 05/02/2020 0.0  0.0 - 0.2 % Final  . Neutrophils Relative % 05/02/2020 71  % Final  . Neutro Abs 05/02/2020 6.2  1.7 - 7.7 K/uL Final  . Lymphocytes Relative 05/02/2020 21  % Final  . Lymphs Abs 05/02/2020 1.9  0.7 - 4.0 K/uL Final  . Monocytes Relative 05/02/2020 7  % Final  . Monocytes Absolute 05/02/2020 0.7  0.1 - 1.0 K/uL Final  . Eosinophils Relative 05/02/2020 1  % Final  . Eosinophils Absolute 05/02/2020 0.1  0.0 - 0.5 K/uL Final  . Basophils Relative 05/02/2020 0  % Final  . Basophils Absolute 05/02/2020 0.0  0.0 - 0.1 K/uL Final  . Immature Granulocytes 05/02/2020 0  % Final  . Abs Immature Granulocytes 05/02/2020 0.02  0.00 - 0.07 K/uL Final   Performed at [facility], [place] [address], [place], [place] [address]  . Salicylate Lvl 50/12/7046 <7.0* 7.0 - 30.0 mg/dL Final   Performed at [place] [address], [place], [place] [address]  . Acetaminophen (Tylenol), Serum 05/02/2020 <10* 10 - 30 ug/mL Final   Comment: (NOTE) Therapeutic concentrations vary significantly. A range of 10-30 ug/mL  may be an effective concentration for many patients. However, some  are best treated at concentrations outside of this range. Acetaminophen concentrations >150 ug/mL at 4 hours after ingestion  and >50 ug/mL at 12 hours after ingestion are often associated with  toxic reactions.  Performed at [facility], [place] [address] [place], [place] [address]   . SARS Coronavirus 2 05/02/2020 NEGATIVE  NEGATIVE Final   Comment: (NOTE) SARS-CoV-2 target nucleic acids are NOT DETECTED.  The SARS-CoV-2 RNA is generally detectable in upper and lower respiratory specimens during the acute phase of infection. The lowest concentration of SARS-CoV-2 viral copies this assay can detect is 250 copies / mL. A negative result does not preclude SARS-CoV-2 infection and should not be used as the sole basis for treatment or other patient management decisions.  A negative result may occur with improper specimen collection / handling, submission of specimen other than nasopharyngeal swab, presence of viral mutation(s) within the areas targeted by this assay, and inadequate number of viral copies (<250 copies / mL). A negative result must be combined with clinical observations, patient history, and epidemiological information.  Fact Sheet for Patients:   StrictlyIdeas.no  Fact Sheet for Healthcare Providers: BankingDealers.co.za  This test is not yet approved or                           cleared by the [place] FDA and has been authorized for detection and/or diagnosis of  SARS-CoV-2 by FDA under an Emergency Use Authorization (EUA).  This EUA will remain in effect (meaning this test can be used) for the duration of the COVID-19 declaration under Section 564(b)(1) of the Act, 21 U.S.C. section 360bbb-3(b)(1), unless the authorization is terminated or revoked sooner.  Performed at [facility], [place] [address] [place], [place] [address]   There may be more visits with results that are not included.    Allergies: Fish allergy and Shellfish allergy  PTA Medications: (Not in a hospital admission)   Medical Decision Making  Overnight observation -We will restart home medications where appropriate    Recommendations  Based on my evaluation the patient does not appear to have an emergency medical condition.  Derrill Center, NP 10/14/20  6:24 PM

## 2020-10-14 NOTE — ED Notes (Signed)
Dinner given:  Dalbert Mayotte and Onnie Boer

## 2020-10-14 NOTE — ED Notes (Signed)
Patient taken out to safe transport in ER entrance

## 2020-10-14 NOTE — ED Notes (Signed)
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

## 2020-10-14 NOTE — ED Notes (Signed)
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

## 2020-10-14 NOTE — BH Assessment (Signed)
Comprehensive Clinical Assessment (CCA) Screening, Triage and Referral Note  10/14/2020 Brian [name] [id number]  Chief Complaint: No chief complaint on file.  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 Lacretia [name] at [facility] ED and determining that pt was a potential danger to himself and would benefit from admission into the [facility]. Pt shares he has a hx of amphetamine, EtOH, and heroin abuse and that he has been clean since November 2021 (use of EtOH and marijuana) with his last use of amphetamine on July 30, 2020. Pt acknowledges he has recently been abusing energy pills in an attempt to obtain the same feeling that he gets from amphetamine use and states that on Monday, December 13, he intentionally took an overdose of the pills. He shares he's had 3 heart attacks this year and that he was told by his cardiologist if he uses meth again, in any amount, it's likely it will kill him.  Pt shares that he recently lost his company, his house, he can't work due to his physical limitations (due to his heart), and his son won't talk to him (because he relapsed on his birthday and missed his party). He shares that he had been clean from the use of meth for 2 1/2 but that he relapsed on meth on December 12, 2019 and used daily until April 29, 2020. He remained clean until August 24 when he used 3 1/2 grams of methamphetamine and had a heart attack. He states he attempted to kill himself via o/d on September 4 (he used 1/2 gram methamphetamine) and October 4 (he used 1/2 gram methamphetamine). He states he used EtOH and marijuana until approximately 4-5 weeks ago when he was able to get clean; he states he is now living at [facility] Pt states he  hates living at [facility] and that all they do is put people to work; he states they offer no treatment and that he's been in contact with multiple [employer] in an attempt to be accepted into one.  Pt confirms SI; he states he attempted to kill himself 18 times in 2011 by intentional o/d. He states he attempted to hang himself in 2013. Pt denies he has a plan to kill himself at this time. He states he's been to many hospitals multiple times, including, but not limited to, [facility] (20+ times), [facility], and [facility] (several times). He denies HI, AVH (while clean; he acknowledges he experiences them while using methamphetamine), NSSIB, access to guns/weapons (he states he gave them to his mother and that he doesn't know where they are), and engagement with the legal system. Pt shares he was drinking 1/5 of liquor daily until November 2021. He states he had been using 3 grams of methamphetamine daily until October 2021.  Pt was able to identify that when he's isolated he "get[s] like this," which results in him using substances.  Pt's protective factors including his ability to identify when he's about to relapse, no HI, and no (sober) AVH.  Pt declined to provide verbal consent for clinician to make contact with his mother for collateral information.  Pt is oriented x5. His recent/remote memory is intact. Pt was cooperative throughout the assessment process. Pt's insight, judgement, and impulse control is poor at this time.  Disposition: Brian Ala, NP, reviewed pt's chart and information and determined pt should be observed overnight for safety and stability and re-assessed in the morning by psychiatry.   Patient Reported Information How did you hear about [place]? Other (Comment) (Pt was transferred by Dr. Lacretia [name]; pt has been here previously)   Referral name: Dr. Lacretia [name], EDP   Referral phone number: 0 (N/A)  Whom do you see for routine medical problems? Hospital  ER   Practice/Facility Name: [facility] or [facility]   Practice/Facility Phone Number: 0 (N/A)   Name of Contact: Numerous providers   Contact Number: N/A   Contact Fax Number: N/A   Prescriber Name: Numerous providers   Prescriber Address (if known): N/A  What Is the Reason for Your Visit/Call Today? Pt states he is "depressed--I've been going through a lot of shit. I've had 3 heart attacks this year (in 2021)."  How Long Has This Been Causing You Problems? > than 6 months  Have You Recently Been in Any Inpatient Treatment (Hospital/Detox/Crisis Center/28-Day Program)? No   Name/Location of Program/Hospital:[facility]   How Long Were You There? 10 days   When Were You Discharged? 07/30/2020  Have You Ever Received Services From [employer] Before? Yes   Who Do You See at [facility]? Pt was receiving otpt services from [facility]  Have You Recently Had Any Thoughts About [place]? Yes   Are You Planning to Commit Suicide/Harm Yourself At This time?  No  Have you Recently Had Thoughts About [place]? No   Explanation: Endorses HI toward step-brother's older brother, Brian [name]. Patient reports that he believes this person tainted his meth.  Have You Used Any Alcohol or Drugs in the Past 24 Hours? No   How Long Ago Did You Use Drugs or Alcohol?  0000 (starting at the age of 34 yrs old)   What Did You Use and How Much? No data recorded What Do You Feel Would Help You the Most Today? Other (Comment) (Pt is unsure but is concerned about relapsing)  Do You Currently Have a Therapist/Psychiatrist? No   Name of Therapist/Psychiatrist: No data recorded  Have You Been Recently Discharged From Any Office Practice or Programs? No   Explanation of Discharge From Practice/Program:  No data recorded    CCA Screening Triage Referral Assessment Type of Contact: Face-to-Face   Is this Initial or Reassessment? Initial Assessment   Date Telepsych  consult ordered in [facility]:  05/02/2020   Time Telepsych consult ordered in [facility]:  1242  Patient Reported Information Reviewed? Yes   Patient Left Without Being Seen? No data recorded  Reason for Not Completing Assessment: No data recorded Collateral Involvement: Pt declined to provide verbal consent for clinician to contact friends/family members for collateral information.  Does Patient Have a Stage manager Guardian? No data recorded  Name and Contact of Legal Guardian:  No data recorded If Minor and Not Living with Parent(s), Who has Custody? N/A  Is CPS involved or ever been involved? Never  Is APS involved or ever been involved? Never  Patient Determined To Be At Risk for Harm To Self or Others Based on Review of Patient Reported Information or Presenting Complaint? Yes, for Self-Harm   Method: Plan with intent and identified person   Availability of Means: Has close by   Intent: Clearly intends on inflicting harm that could cause death   Notification Required: Another person is identifiable and needs to be warned  to ensure safety (DUTY TO WARN)   Additional Information for Danger to Others Potential:  No data recorded  Additional Comments for Danger to Others Potential:  Identified person is in [facility]: Brian [name].   Are There Guns or Other Weapons in [place]?  No    Types of Guns/Weapons: No data recorded   Are These Weapons Safely Secured?                              No data recorded   Who Could Verify You Are Able To Have These Secured:    No data recorded Do You Have any Outstanding Charges, Pending Court Dates, Parole/Probation? Denies  Contacted To Inform of Risk of Harm To Self or Others: -- (Pt declined to provide verbal consent for clinician to contact friends/family members)  Location of Assessment: [facility]  Does Patient Present under Involuntary Commitment? No   IVC Papers Initial File Date: No data recorded  [place] of  Residence: [place]  Patient Currently Receiving the Following Services: -- (Pt is currently living at [place])   Determination of Need: Emergent (2 hours)   Options For Referral: [facility] (Overnight Observation) Brian Ala, NP, reviewed pt's chart and information and determined pt should be observed overnight for safety and stability and re-assessed in the morning by psychiatry.    Dannielle Burn, LMFT

## 2020-10-14 NOTE — ED Triage Notes (Signed)
Pt reports he was seen here recently for indigestion and was given a prescription for pepcid. Pt states he started having bilateral flank pain and vomiting yesterday. Per pt he has hx of stage 3 CKD.

## 2020-10-14 NOTE — ED Notes (Signed)
Patient given sandwich, cookies & soda. Ambulating without issue. In no distress at this time.

## 2020-10-14 NOTE — ED Notes (Signed)
Locker #[phone]

## 2020-10-15 MED ORDER — BUPROPION HCL ER (XL) 300 MG PO TB24
300.0000 mg | ORAL_TABLET | Freq: Every day | ORAL | Status: DC
  Filled 2020-10-15: qty 7

## 2020-10-15 MED ORDER — QUETIAPINE FUMARATE 200 MG PO TABS: 200.0000 mg | ORAL_TABLET | Freq: Every day | ORAL | 0 refills | Status: DC

## 2020-10-15 MED ORDER — BUPROPION HCL ER (XL) 300 MG PO TB24: 300.0000 mg | ORAL_TABLET | Freq: Every day | ORAL | 0 refills | Status: DC

## 2020-10-15 MED ORDER — BUPROPION HCL ER (XL) 150 MG PO TB24
150.0000 mg | ORAL_TABLET | Freq: Every day | ORAL | Status: DC
Start: 2020-10-15 — End: 2020-10-15
  Administered 2020-10-15: 09:00:00 150 mg via ORAL
  Filled 2020-10-15: qty 1

## 2020-10-15 MED ORDER — QUETIAPINE FUMARATE 200 MG PO TABS
200.0000 mg | ORAL_TABLET | Freq: Every day | ORAL | Status: DC
  Filled 2020-10-15: qty 7

## 2020-10-15 MED ORDER — PANTOPRAZOLE SODIUM 40 MG PO TBEC
40.0000 mg | DELAYED_RELEASE_TABLET | Freq: Two times a day (BID) | ORAL | Status: DC
  Administered 2020-10-15: 09:00:00 40 mg via ORAL
  Filled 2020-10-15: qty 1
  Filled 2020-10-15: qty 14

## 2020-10-15 MED ORDER — ATORVASTATIN CALCIUM 80 MG PO TABS: 80.0000 mg | ORAL_TABLET | Freq: Every day | ORAL | 0 refills | Status: DC

## 2020-10-15 MED ORDER — BUPROPION HCL ER (XL) 150 MG PO TB24
150.0000 mg | ORAL_TABLET | Freq: Once | ORAL | Status: AC
  Administered 2020-10-15: 11:00:00 150 mg via ORAL
  Filled 2020-10-15: qty 1

## 2020-10-15 MED ORDER — ASPIRIN EC 81 MG PO TBEC
81.0000 mg | DELAYED_RELEASE_TABLET | Freq: Every day | ORAL | Status: DC
  Administered 2020-10-15: 09:00:00 81 mg via ORAL
  Filled 2020-10-15: qty 1
  Filled 2020-10-15: qty 7

## 2020-10-15 MED ORDER — PANTOPRAZOLE SODIUM 40 MG PO TBEC: 40.0000 mg | DELAYED_RELEASE_TABLET | Freq: Two times a day (BID) | ORAL | 0 refills | Status: DC

## 2020-10-15 MED ORDER — ASPIRIN 81 MG PO TBEC: 81.0000 mg | DELAYED_RELEASE_TABLET | Freq: Every day | ORAL | 0 refills | Status: DC

## 2020-10-15 MED ORDER — ATORVASTATIN CALCIUM 40 MG PO TABS
80.0000 mg | ORAL_TABLET | Freq: Every day | ORAL | Status: DC
  Filled 2020-10-15: qty 14

## 2020-10-15 NOTE — ED Notes (Signed)
Pt currently sleeping. Safety maintained and will continue to monitor.

## 2020-10-15 NOTE — ED Notes (Signed)
Educated pt on avs and medications. Verbalized understanding. Escorted pt to retrieve belongings. Ambulated per self. No s/s pain, discomfort, or acute distress noted. Escorted to front lobby for d/c. A&O x4. No SI, HI, or AVH noted. Stable at time of d/c

## 2020-10-15 NOTE — ED Notes (Signed)
Pt offered breakfast; declined; requested only juice.

## 2020-10-15 NOTE — Discharge Instructions (Signed)

## 2020-10-15 NOTE — ED Notes (Signed)
Pt sleeping at present, no distress noted, monitoring for safety. 

## 2020-10-15 NOTE — ED Provider Notes (Signed)
FBC/OBS ASAP Discharge Summary  Date and Time: 10/15/2020 10:29 AM  Name: Brian [name]  MRN:  [id number]   Discharge Diagnoses:  Final diagnoses:  Suicidal ideation  Substance induced mood disorder ([place])    Subjective: Patient reports today that he is feeling somewhat better.  He denies any suicidal homicidal ideations and denies any hallucinations.  Patient admits to abusing over-the-counter substances.  He states that he has been buying red line, which is a supplement from [facility], over-the-counter and has been abusing it because he states that it makes him have the same is using methamphetamines.  He states that he knows that he needs to get clean off of drugs or any substances because he wants to be better to be there for his child.  Patient is requesting substance abuse treatment however patient's UDS is negative [facility].  Patient then stated that he wanted to stay in the [place] area because of his child.  He states that he wants to be restarted on his medications of Seroquel, Wellbutrin XL, Lipitor, Protonix, and aspirin.  He states that he is interested in the intensive outpatient treatment at the [facility] because he has heard about it before.  Patient continues to report that he knows that part of this is his fault because he has not been following up as he should.  Patient also reports that he wants to the information provided to him again for open access at the [facility] and plans to follow back up with NA.  Stay Summary: Patient is a 34 year old male that is well-known to our service line for psychiatric assistance.  Patient has a history of polysubstance abuse as well as bipolar disorder.  Patient has been in the ED as well as admissions numerous times in the past.  Patient presented yesterday to the [facility] as a walk-in reporting suicidal ideations with plan to hang himself or overdose on medications.  Patient reports that he has been staying at a sober  living of [place] for the last few months and that due to his substance use as well as his heart condition he has not been able to work as he should because they put him on light duty.  Patient was admitted to the continuous observation unit for overnight assessment.  Today the patient is denying any suicidal homicidal ideations and denied any hallucinations.  Patient reported using over-the-counter product from [facility] to give him the same effect is getting high on methamphetamines.  Patient reported after he was admitted to [facility] on 09/11/2020 through 09/19/2020 he was placed at sober living of [place] and he has not followed up with his outpatient visits as he should.  Patient was restarted on his medications of Seroquel 200 mg p.o. nightly, Lipitor 80 mg p.o. daily, aspirin 81 mg p.o. daily, Protonix 40 mg p.o. twice daily, and Wellbutrin XL 300 mg p.o. daily.  Social work assisted and try to get the patient placed into residential substance abuse treatment but was declined due to have a negative UDS.  Patient then opted for the [facility] program to assist with substance abuse and stated that he will follow-up at the open access with the [facility].  Patient was discharged and [facility] [place] provided him transportation back to their facility.  Total Time spent with patient: 30 minutes  Past Psychiatric History: Polysubstance abuse, suicidal ideations, reported suicide attempts, bipolar disorder, multiple hospitalizations and ED visits Past Medical  History:  Past Medical History:  Diagnosis Date  . Alcohol abuse   . Anxiety   . Bipolar 2 disorder ([place])   . Cocaine abuse ([place])   . Depression   . Kidney stones   . Narcotic abuse ([place])   . Peptic ulcer   . Polysubstance abuse ([place])   . Renal disorder    kidney stones    Past Surgical History:  Procedure Laterality Date  . ESOPHAGOGASTRODUODENOSCOPY N/A 05/29/2014   Procedure: ESOPHAGOGASTRODUODENOSCOPY (EGD) ;  Surgeon: Beryle Beams, MD;  Location: [facility] ENDOSCOPY;  Service: Endoscopy;  Laterality: N/A;  check with Dr. Collene Mares about sedation type/timinng - I recommend MAC  . HAND SURGERY  2006  . LEFT HEART CATH AND CORONARY ANGIOGRAPHY N/A 06/21/2020   Procedure: LEFT HEART CATH AND CORONARY ANGIOGRAPHY;  Surgeon: Nelva Bush, MD;  Location: [facility];  Service: Cardiovascular;  Laterality: N/A;   Family History:  Family History  Problem Relation Age of Onset  . Heart disease Father    Family Psychiatric History: None reported Social History:  Social History   Substance and Sexual Activity  Alcohol Use Not Currently     Social History   Substance and Sexual Activity  Drug Use Not Currently  . Types: Marijuana, Cocaine, IV, Methamphetamines   Comment: heroin and meth    Social History   Socioeconomic History  . Marital status: Single    Spouse name: Not on file  . Number of children: Not on file  . Years of education: Not on file  . Highest education level: Not on file  Occupational History  . Not on file  Tobacco Use  . Smoking status: Current Every Day Smoker    Packs/day: 0.50    Types: Cigarettes  . Smokeless tobacco: Never Used  Vaping Use  . Vaping Use: Never used  Substance and Sexual Activity  . Alcohol use: Not Currently  . Drug use: Not Currently    Types: Marijuana, Cocaine, IV, Methamphetamines    Comment: heroin and meth  . Sexual activity: Not on file  Other Topics Concern  . Not on file  Social History Narrative   Lives with a friend   Lost house and custody of son 9broke up with fiancee)   Nature conservation officer   Social Determinants of Radio broadcast assistant Strain: Not on file  Food Insecurity: Not on file  Transportation Needs: Not on file  Physical Activity: Not on file  Stress: Not on file  Social Connections: Not on file   SDOH:  SDOH Screenings   Alcohol Screen: Low Risk   . Last Alcohol Screening Score (AUDIT): 4  Depression (PHQ2-9):  Medium Risk  . PHQ-2 Score: 12  Financial Resource Strain: Not on file  Food Insecurity: Not on file  Housing: High Risk  . Last Housing Risk Score: 2  Physical Activity: Not on file  Social Connections: Not on file  Stress: Not on file  Tobacco Use: High Risk  . Smoking Tobacco Use: Current Every Day Smoker  . Smokeless Tobacco Use: Never Used  Transportation Needs: Not on file    Has this patient used any form of tobacco in the last 30 days? (Cigarettes, Smokeless Tobacco, Cigars, and/or Pipes) A prescription for an FDA-approved tobacco cessation medication was offered at discharge and the patient refused  Current Medications:  Current Facility-Administered Medications  Medication Dose Route Frequency Provider Last Rate Last Admin  . acetaminophen (TYLENOL) tablet 650 mg  650  mg Oral Q6H PRN Derrill Center, NP      . alum & mag hydroxide-simeth (MAALOX/MYLANTA) 200-200-20 MG/5ML suspension 30 mL  30 mL Oral Q4H PRN Derrill Center, NP      . aspirin EC tablet 81 mg  81 mg Oral Daily Berlene Dixson, Lowry Ram, FNP   81 mg at 10/15/20 0912  . atorvastatin (LIPITOR) tablet 80 mg  80 mg Oral q1800 Tamsin Nader, Darnelle Maffucci B, FNP      . buPROPion (WELLBUTRIN XL) 24 hr tablet 150 mg  150 mg Oral Once Nadya Hopwood, Lowry Ram, FNP      . [START ON 10/16/2020] buPROPion (WELLBUTRIN XL) 24 hr tablet 300 mg  300 mg Oral Daily Ogle Hoeffner B, FNP      . magnesium hydroxide (MILK OF MAGNESIA) suspension 30 mL  30 mL Oral Daily PRN Derrill Center, NP      . pantoprazole (PROTONIX) EC tablet 40 mg  40 mg Oral BID Oleva Koo, Lowry Ram, FNP   40 mg at 10/15/20 0912  . QUEtiapine (SEROQUEL) tablet 200 mg  200 mg Oral QHS Syvilla Martin, Darnelle Maffucci B, FNP      . traZODone (DESYREL) tablet 50 mg  50 mg Oral QHS,MR X 1 Derrill Center, NP       Current Outpatient Medications  Medication Sig Dispense Refill  . [START ON 10/16/2020] aspirin EC 81 MG EC tablet Take 1 tablet (81 mg total) by mouth daily. Swallow whole. 30 tablet 0  . atorvastatin  (LIPITOR) 80 MG tablet Take 1 tablet (80 mg total) by mouth daily at 6 PM. 30 tablet 0  . [START ON 10/16/2020] buPROPion (WELLBUTRIN XL) 300 MG 24 hr tablet Take 1 tablet (300 mg total) by mouth daily. 30 tablet 0  . pantoprazole (PROTONIX) 40 MG tablet Take 1 tablet (40 mg total) by mouth 2 (two) times daily. 60 tablet 0  . QUEtiapine (SEROQUEL) 200 MG tablet Take 1 tablet (200 mg total) by mouth at bedtime. 30 tablet 0    PTA Medications: (Not in a hospital admission)   Musculoskeletal  Strength & Muscle Tone: within normal limits Gait & Station: normal Patient leans: N/A  Psychiatric Specialty Exam  Presentation  General Appearance: Appropriate for Environment; Casual  Eye Contact:Good  Speech:Clear and Coherent; Normal Rate  Speech Volume:Normal  Handedness:Right   Mood and Affect  Mood:Euthymic  Affect:Appropriate; Congruent   Thought Process  Thought Processes:Coherent  Descriptions of Associations:Intact  Orientation:Full (Time, Place and Person)  Thought Content:WDL  Hallucinations:Hallucinations: None  Ideas of Reference:None  Suicidal Thoughts:Suicidal Thoughts: No  Homicidal Thoughts:Homicidal Thoughts: No   Sensorium  Memory:Immediate Good; Recent Good; Remote Good  Judgment:Good  Insight:Good   Executive Functions  Concentration:Good  Attention Span:Good  [place] of Knowledge:Good  Language:Good   Psychomotor Activity  Psychomotor Activity:Psychomotor Activity: Normal   Assets  Assets:Communication Skills; Desire for Improvement; Housing; Social Support; Transportation   Sleep  Sleep:Sleep: Good   Physical Exam  Physical Exam Vitals and nursing note reviewed.  Constitutional:      Appearance: He is well-developed.  HENT:     Head: Normocephalic.  Eyes:     Pupils: Pupils are equal, round, and reactive to light.  Cardiovascular:     Rate and Rhythm: Normal rate.  Pulmonary:     Effort: Pulmonary  effort is normal.  Musculoskeletal:        General: Normal range of motion.  Neurological:     Mental Status: He is alert  and oriented to person, place, and time.    Review of Systems  Constitutional: Negative.   HENT: Negative.   Eyes: Negative.   Respiratory: Negative.   Cardiovascular: Negative.   Gastrointestinal: Negative.   Genitourinary: Negative.   Musculoskeletal: Negative.   Skin: Negative.   Neurological: Negative.   Endo/Heme/Allergies: Negative.   Psychiatric/Behavioral: Positive for substance abuse.   Blood pressure 130/85, pulse 80, temperature 98 F (36.7 C), temperature source Oral, resp. rate 18, SpO2 100 %. There is no height or weight on file to calculate BMI.  Demographic Factors:  Male, Caucasian and Low socioeconomic status  Loss Factors: NA  Historical Factors: Prior suicide attempts  Risk Reduction Factors:   Responsible for children under 65 years of age, Sense of responsibility to family, Employed, Living with another person, especially a relative, Positive social support and Positive therapeutic relationship  Continued Clinical Symptoms:  Alcohol/Substance Abuse/Dependencies Previous Psychiatric Diagnoses and Treatments  Cognitive Features That Contribute To Risk:  None    Suicide Risk:  Mild:  Suicidal ideation of limited frequency, intensity, duration, and specificity.  There are no identifiable plans, no associated intent, mild dysphoria and related symptoms, good self-control (both objective and subjective assessment), few other risk factors, and identifiable protective factors, including available and accessible social support.  Plan Of Care/Follow-up recommendations:  Continue activity as tolerated. Continue diet as recommended by your PCP. Ensure to keep all appointments with outpatient providers.  Disposition: Discharge to [facility] [place] [place] 10/15/2020, 10:29 AM

## 2020-10-18 NOTE — Addendum Note (Signed)
Addended by: Dollene Primrose on: 10/18/2020 08:15 AM   Modules accepted: Orders

## 2020-10-18 NOTE — Addendum Note (Signed)
Addended by: Rudean Haskell A on: 10/18/2020 10:22 AM   Modules accepted: Orders

## 2020-11-01 ENCOUNTER — Ambulatory Visit (INDEPENDENT_AMBULATORY_CARE_PROVIDER_SITE_OTHER): Payer: 59 | Admitting: Behavioral Health

## 2020-11-01 ENCOUNTER — Other Ambulatory Visit: Payer: Self-pay

## 2020-11-01 DIAGNOSIS — F1910 Other psychoactive substance abuse, uncomplicated: Secondary | ICD-10-CM | POA: Diagnosis not present

## 2020-11-08 ENCOUNTER — Telehealth (HOSPITAL_COMMUNITY): Payer: Self-pay

## 2020-11-08 NOTE — Telephone Encounter (Signed)
Detailed instructions left on the patient's answering machine. Asked to call back with any questions. S.Devoiry Corriher EMTP 

## 2020-11-09 ENCOUNTER — Other Ambulatory Visit (HOSPITAL_COMMUNITY): Payer: Self-pay

## 2020-11-13 ENCOUNTER — Encounter (HOSPITAL_COMMUNITY): Payer: Self-pay

## 2020-11-20 ENCOUNTER — Encounter (HOSPITAL_COMMUNITY): Payer: Self-pay | Admitting: Internal Medicine

## 2020-11-20 ENCOUNTER — Encounter (HOSPITAL_COMMUNITY): Payer: Self-pay

## 2020-11-21 ENCOUNTER — Other Ambulatory Visit: Payer: Self-pay

## 2020-11-21 ENCOUNTER — Ambulatory Visit (INDEPENDENT_AMBULATORY_CARE_PROVIDER_SITE_OTHER): Payer: No Payment, Other | Admitting: Physician Assistant

## 2020-11-21 ENCOUNTER — Encounter (HOSPITAL_COMMUNITY): Payer: Self-pay | Admitting: Physician Assistant

## 2020-11-21 DIAGNOSIS — F3181 Bipolar II disorder: Secondary | ICD-10-CM

## 2020-11-21 MED ORDER — QUETIAPINE FUMARATE 200 MG PO TABS: 200.0000 mg | ORAL_TABLET | Freq: Every day | ORAL | 1 refills | Status: DC

## 2020-11-21 MED ORDER — BUPROPION HCL ER (XL) 300 MG PO TB24: 300.0000 mg | ORAL_TABLET | Freq: Every day | ORAL | 1 refills | Status: DC

## 2020-11-21 NOTE — Progress Notes (Signed)
Psychiatric Initial Adult Assessment   Patient Identification: Brian [name] MRN:  [id number] Date of Evaluation:  11/21/2020 Referral Source: Beverly Sessions Chief Complaint:  New Patient Evaluation/medication management Visit Diagnosis:    ICD-10-CM   1. Bipolar II disorder, severe, depressed, with anxious distress ([place])  F31.81 buPROPion (WELLBUTRIN XL) 300 MG 24 hr tablet    QUEtiapine (SEROQUEL) 200 MG tablet    History of Present Illness:    Brian [name] is a 35 year old male with a past psychiatric history significant for bipolar 2 disorder who presents to [facility] for medication management.  Patient was referred over to [facility] by [facility]. Patient reports that he is being managed on the following medications:  Wellbutrin XL 300 mg 24-hour tablet daily Seroquel 200 mg at bedtime Remeron 30 mg daily  Patient is requesting refills for his Wellbutrin and Seroquel.  For the past few weeks, patient states that he has been worried about his mental health ever since he ran out of prescriptions for his Wellbutrin and Seroquel.  Patient reports that he has a history of substance abuse and will be 90 days clean on the 5th of February.  Patient currently living at [facility] and regularly attends NA meetings.  Patient endorses the following symptoms: scattered mind, disturbed sleep, easily irritable, and anxiety.  He rates his current anxiety is 6 out of 10 and states that he will normally go on nature walks or read books in order to alleviate his anxiety.  Patient denies experiencing any panic attacks recently.  Patient's main stressor is his family due to the fact that they still engage in drinking while the patient is trying to maintain his sobriety.  Patient is pleasant, calm, cooperative, and fully engaged in conversation during the encounter.  Patient denies suicidal or homicidal ideations and states that he has made attempts on his life in  the past via drug overdose.  Patient denies auditory or visual hallucinations and states that the only time he ever experienced hallucinations was when he was on methamphetamine.  Patient endorses fair sleep and receives on average 4 to 5 hours of sleep a night.  When patient is on his medications, he states he receives roughly 8 hours of sleep.  Patient endorses good appetite and eats on average 2 meals per day.  Patient denies alcohol use and illicit drug use.  Patient endorses tobacco use and smokes half a pack a day.  Associated Signs/Symptoms: Depression Symptoms:  psychomotor agitation, fatigue, feelings of worthlessness/guilt, difficulty concentrating, anxiety, loss of energy/fatigue, disturbed sleep, (Hypo) Manic Symptoms:  Distractibility, Flight of Ideas, Irritable Mood, Labiality of Mood, Pressured Speech Anxiety Symptoms:  Excessive Worry, Specific Phobias, Psychotic Symptoms:  N/A PTSD Symptoms: Had a traumatic exposure:  Patient endorses physical, mental and emotional trauma Had a traumatic exposure in the last month:  N/A Re-experiencing:  Flashbacks Intrusive Thoughts Nightmares Hypervigilance:  Yes Hyperarousal:  Difficulty Concentrating Irritability/Anger Avoidance:  Foreshortened Future  Past Psychiatric History:  Bipolar II disorder  Previous Psychotropic Medications: Yes   Substance Abuse History in the last 12 months:  Yes.    Consequences of Substance Abuse: Medical Consequences:  Patient reports being admitted to behavioral health hospitals due to substance abuse Legal Consequences:  Not applicable Family Consequences:  Patient reports that substance abuse has disrupted his relationship with his ex-girlfriend and various family members.  Patient reports that he only talks to his mother from a distance since she is still an alcoholic and he  is trying to maintain an sobriety Blackouts:  Not applicable DT's: Not applicable Withdrawal Symptoms:    None  Past Medical History:  Past Medical History:  Diagnosis Date  . Alcohol abuse   . Anxiety   . Bipolar 2 disorder ([place])   . Cocaine abuse ([place])   . Depression   . Kidney stones   . Narcotic abuse ([place])   . Peptic ulcer   . Polysubstance abuse ([place])   . Renal disorder    kidney stones    Past Surgical History:  Procedure Laterality Date  . ESOPHAGOGASTRODUODENOSCOPY N/A 05/29/2014   Procedure: ESOPHAGOGASTRODUODENOSCOPY (EGD) ;  Surgeon: Beryle Beams, MD;  Location: [facility] ENDOSCOPY;  Service: Endoscopy;  Laterality: N/A;  check with Dr. Collene Mares about sedation type/timinng - I recommend MAC  . HAND SURGERY  2006  . LEFT HEART CATH AND CORONARY ANGIOGRAPHY N/A 06/21/2020   Procedure: LEFT HEART CATH AND CORONARY ANGIOGRAPHY;  Surgeon: Nelva Bush, MD;  Location: [place] CV LAB;  Service: Cardiovascular;  Laterality: N/A;    Family Psychiatric History: Mother - Bipolar II Disorder Grandmother (Maternal) - Bipolar II Disorder  Family History:  Family History  Problem Relation Age of Onset  . Heart disease Father     Social History:   Social History   Socioeconomic History  . Marital status: Single    Spouse name: Not on file  . Number of children: Not on file  . Years of education: Not on file  . Highest education level: Not on file  Occupational History  . Not on file  Tobacco Use  . Smoking status: Current Every Day Smoker    Packs/day: 0.50    Types: Cigarettes  . Smokeless tobacco: Never Used  Vaping Use  . Vaping Use: Never used  Substance and Sexual Activity  . Alcohol use: Not Currently  . Drug use: Not Currently    Types: Marijuana, Cocaine, IV, Methamphetamines    Comment: heroin and meth  . Sexual activity: Not on file  Other Topics Concern  . Not on file  Social History Narrative   Lives with a friend   Lost house and custody of son 9broke up with fiancee)   Nature conservation officer   Social Determinants of Radio broadcast assistant  Strain: Not on file  Food Insecurity: Not on file  Transportation Needs: Not on file  Physical Activity: Not on file  Stress: Not on file  Social Connections: Not on file    Additional Social History:  Patient is a Scientific laboratory technician at a [employer]  Allergies:   Allergies  Allergen Reactions  . Fish Allergy Anaphylaxis  . Shellfish Allergy Anaphylaxis    Metabolic Disorder Labs: Lab Results  Component Value Date   HGBA1C 5.3 06/19/2020   MPG 105 06/19/2020   MPG 108.28 03/28/2018   No results found for: PROLACTIN Lab Results  Component Value Date   CHOL 152 03/28/2018   TRIG 99 03/28/2018   HDL 44 03/28/2018   CHOLHDL 3.5 03/28/2018   VLDL 20 03/28/2018   LDLCALC 88 03/28/2018   Lab Results  Component Value Date   TSH 1.644 03/28/2018    Therapeutic Level Labs: No results found for: LITHIUM No results found for: CBMZ No results found for: VALPROATE  Current Medications: Current Outpatient Medications  Medication Sig Dispense Refill  . aspirin EC 81 MG EC tablet Take 1 tablet (81 mg total) by mouth daily. Swallow whole. 30 tablet 0  . atorvastatin (LIPITOR)  80 MG tablet Take 1 tablet (80 mg total) by mouth daily at 6 PM. 30 tablet 0  . buPROPion (WELLBUTRIN XL) 300 MG 24 hr tablet Take 1 tablet (300 mg total) by mouth daily. 30 tablet 1  . pantoprazole (PROTONIX) 40 MG tablet Take 1 tablet (40 mg total) by mouth 2 (two) times daily. 60 tablet 0  . QUEtiapine (SEROQUEL) 200 MG tablet Take 1 tablet (200 mg total) by mouth at bedtime. 30 tablet 1   No current facility-administered medications for this visit.    Musculoskeletal: Strength & Muscle Tone: within normal limits Gait & Station: normal Patient leans: N/A  Psychiatric Specialty Exam: Review of Systems  Psychiatric/Behavioral: Positive for decreased concentration. Negative for dysphoric mood, hallucinations, sleep disturbance and suicidal ideas. The patient is nervous/anxious and is hyperactive.      There were no vitals taken for this visit.There is no height or weight on file to calculate BMI.  General Appearance: Well Groomed  Eye Contact:  Good  Speech:  Clear and Coherent and Normal Rate  Volume:  Normal  Mood:  Anxious and Euthymic  Affect:  Appropriate and Congruent  Thought Process:  Coherent, Goal Directed and Descriptions of Associations: Intact  Orientation:  Full (Time, Place, and Person)  Thought Content:  WDL and Logical  Suicidal Thoughts:  No  Homicidal Thoughts:  No  Memory:  Immediate;   Good Recent;   Good Remote;   Good  Judgement:  Good  Insight:  Fair  Psychomotor Activity:  Restlessness  Concentration:  Concentration: Good and Attention Span: Good  Recall:  Good  Fund of Knowledge:Good  Language: Good  Akathisia:  NA  Handed:  Right  AIMS (if indicated):  not done  Assets:  Communication Skills Desire for Improvement Housing Social Support Vocational/Educational  ADL's:  Intact  Cognition: WNL  Sleep:  Fair   Screenings: AIMS   [facility] Admission (Discharged) from 03/25/2018 in [facility]  AIMS Total Score 0    AUDIT   [place] ED from 05/02/2020 in [facility] Admission (Discharged) from 03/25/2018 in [facility]  Alcohol Use Disorder Identification Test Final Score (AUDIT) 4 0    PHQ2-9   Flowsheet Row ED from 05/02/2020 in [place] DEPT  PHQ-2 Total Score 3  PHQ-9 Total Score 12      Assessment and Plan:  Brian [name] is a 35 year old male with a past psychiatric history significant for bipolar 2 disorder who presents to [facility] for medication management.  Patient is requesting refills on his Wellbutrin and Seroquel.  Patient endorses the following symptoms: scattered mind, disturbed sleep, irritability, and anxiety.  Patient's medications will be  refilled upon conclusion of the encounter.  1. Bipolar II disorder, moderate, hypomanic, with anxious distress, in partial remission (HCC)  - buPROPion (WELLBUTRIN XL) 300 MG 24 hr tablet; Take 1 tablet (300 mg total) by mouth daily.  Dispense: 30 tablet; Refill: 1 - QUEtiapine (SEROQUEL) 200 MG tablet; Take 1 tablet (200 mg total) by mouth at bedtime.  Dispense: 30 tablet; Refill: 1  Patient to follow up in 6 weeks  Malachy Mood, PA 1/26/20225:05 PM

## 2020-12-04 ENCOUNTER — Telehealth (HOSPITAL_COMMUNITY): Payer: Self-pay | Admitting: Internal Medicine

## 2020-12-04 NOTE — Telephone Encounter (Signed)
Just an FYI. We have made several attempts to contact this patient including sending a letter to schedule or reschedule their Myoview. We will be removing the patient from the echo/NUC WQ.  11/20/20 NO SHOWED - MAILED LETTER [facility]          Thank you

## 2020-12-16 ENCOUNTER — Encounter (HOSPITAL_COMMUNITY): Payer: Self-pay

## 2020-12-16 ENCOUNTER — Other Ambulatory Visit: Payer: Self-pay

## 2020-12-16 ENCOUNTER — Inpatient Hospital Stay (HOSPITAL_COMMUNITY)
Admission: EM | Admit: 2020-12-16 | Discharge: 2020-12-21 | DRG: 917 | Disposition: A | Discharge status: Other Acute Inpatient Hospital | Payer: Self-pay | Attending: Family Medicine | Admitting: Family Medicine

## 2020-12-16 DIAGNOSIS — Z79899 Other long term (current) drug therapy: Secondary | ICD-10-CM

## 2020-12-16 DIAGNOSIS — F17210 Nicotine dependence, cigarettes, uncomplicated: Secondary | ICD-10-CM | POA: Diagnosis present

## 2020-12-16 DIAGNOSIS — N170 Acute kidney failure with tubular necrosis: Secondary | ICD-10-CM | POA: Diagnosis present

## 2020-12-16 DIAGNOSIS — Z888 Allergy status to other drugs, medicaments and biological substances status: Secondary | ICD-10-CM

## 2020-12-16 DIAGNOSIS — Z8249 Family history of ischemic heart disease and other diseases of the circulatory system: Secondary | ICD-10-CM

## 2020-12-16 DIAGNOSIS — F1990 Other psychoactive substance use, unspecified, uncomplicated: Secondary | ICD-10-CM | POA: Diagnosis present

## 2020-12-16 DIAGNOSIS — T50904A Poisoning by unspecified drugs, medicaments and biological substances, undetermined, initial encounter: Secondary | ICD-10-CM

## 2020-12-16 DIAGNOSIS — T50901A Poisoning by unspecified drugs, medicaments and biological substances, accidental (unintentional), initial encounter: Secondary | ICD-10-CM | POA: Diagnosis present

## 2020-12-16 DIAGNOSIS — T43291A Poisoning by other antidepressants, accidental (unintentional), initial encounter: Secondary | ICD-10-CM | POA: Diagnosis present

## 2020-12-16 DIAGNOSIS — T50902A Poisoning by unspecified drugs, medicaments and biological substances, intentional self-harm, initial encounter: Secondary | ICD-10-CM

## 2020-12-16 DIAGNOSIS — F32A Depression, unspecified: Secondary | ICD-10-CM | POA: Diagnosis present

## 2020-12-16 DIAGNOSIS — F419 Anxiety disorder, unspecified: Secondary | ICD-10-CM | POA: Diagnosis present

## 2020-12-16 DIAGNOSIS — Z8711 Personal history of peptic ulcer disease: Secondary | ICD-10-CM

## 2020-12-16 DIAGNOSIS — I252 Old myocardial infarction: Secondary | ICD-10-CM

## 2020-12-16 DIAGNOSIS — F3181 Bipolar II disorder: Secondary | ICD-10-CM | POA: Diagnosis present

## 2020-12-16 DIAGNOSIS — Z20822 Contact with and (suspected) exposure to covid-19: Secondary | ICD-10-CM | POA: Diagnosis present

## 2020-12-16 DIAGNOSIS — K279 Peptic ulcer, site unspecified, unspecified as acute or chronic, without hemorrhage or perforation: Secondary | ICD-10-CM

## 2020-12-16 DIAGNOSIS — T43292A Poisoning by other antidepressants, intentional self-harm, initial encounter: Principal | ICD-10-CM | POA: Diagnosis present

## 2020-12-16 DIAGNOSIS — F1010 Alcohol abuse, uncomplicated: Secondary | ICD-10-CM

## 2020-12-16 DIAGNOSIS — Z7982 Long term (current) use of aspirin: Secondary | ICD-10-CM

## 2020-12-16 LAB — SALICYLATE LEVEL: Salicylate Lvl: <7 mg/dL — ABNORMAL LOW (ref 7.0–30.0)

## 2020-12-16 LAB — COMPREHENSIVE METABOLIC PANEL
ALT: 33 U/L (ref 0–44)
AST: 28 U/L (ref 15–41)
Albumin: 4.5 g/dL (ref 3.5–5.0)
Alkaline Phosphatase: 76 U/L (ref 38–126)
Anion gap: 11 (ref 5–15)
BUN: 18 mg/dL (ref 6–20)
CO2: 22 mmol/L (ref 22–32)
Calcium: 9.4 mg/dL (ref 8.9–10.3)
Chloride: 104 mmol/L (ref 98–111)
Creatinine, Ser: 1.43 mg/dL — ABNORMAL HIGH (ref 0.61–1.24)
GFR, Estimated: >60 mL/min (ref >60)
Glucose, Bld: 100 mg/dL — ABNORMAL HIGH (ref 70–99)
Potassium: 4.3 mmol/L (ref 3.5–5.1)
Sodium: 137 mmol/L (ref 135–145)
Total Bilirubin: 0.5 mg/dL (ref 0.3–1.2)
Total Protein: 8.4 g/dL — ABNORMAL HIGH (ref 6.5–8.1)

## 2020-12-16 LAB — CBC WITH DIFFERENTIAL/PLATELET
Abs Immature Granulocytes: 0.03 10*3/uL (ref 0.00–0.07)
Basophils Absolute: 0 10*3/uL (ref 0.0–0.1)
Basophils Relative: 0 %
Eosinophils Absolute: 0.2 10*3/uL (ref 0.0–0.5)
Eosinophils Relative: 2 %
HCT: 44.3 % (ref 39.0–52.0)
Hemoglobin: 14.4 g/dL (ref 13.0–17.0)
Immature Granulocytes: 0 %
Lymphocytes Relative: 15 %
Lymphs Abs: 1.4 10*3/uL (ref 0.7–4.0)
MCH: 29.3 pg (ref 26.0–34.0)
MCHC: 32.5 g/dL (ref 30.0–36.0)
MCV: 90.2 fL (ref 80.0–100.0)
Monocytes Absolute: 0.8 10*3/uL (ref 0.1–1.0)
Monocytes Relative: 8 %
Neutro Abs: 7.1 10*3/uL (ref 1.7–7.7)
Neutrophils Relative %: 75 %
Platelets: 290 10*3/uL (ref 150–400)
RBC: 4.91 MIL/uL (ref 4.22–5.81)
RDW: 14.1 % (ref 11.5–15.5)
WBC: 9.6 10*3/uL (ref 4.0–10.5)
nRBC: 0 % (ref 0.0–0.2)

## 2020-12-16 LAB — ETHANOL: Alcohol, Ethyl (B): <10 mg/dL (ref <10)

## 2020-12-16 LAB — MAGNESIUM: Magnesium: 1.8 mg/dL (ref 1.7–2.4)

## 2020-12-16 LAB — ACETAMINOPHEN LEVEL: Acetaminophen (Tylenol), Serum: <10 ug/mL — ABNORMAL LOW (ref 10–30)

## 2020-12-16 MED ORDER — THIAMINE HCL 100 MG PO TABS
100.0000 mg | ORAL_TABLET | Freq: Every day | ORAL | Status: DC
  Administered 2020-12-18 – 2020-12-20 (×3): 100 mg via ORAL
  Filled 2020-12-16 (×3): qty 1

## 2020-12-16 MED ORDER — ADULT MULTIVITAMIN W/MINERALS CH
1.0000 | ORAL_TABLET | Freq: Every day | ORAL | Status: DC
  Administered 2020-12-17 – 2020-12-20 (×4): 1 via ORAL
  Filled 2020-12-16 (×4): qty 1

## 2020-12-16 MED ORDER — LORAZEPAM 1 MG PO TABS
1.0000 mg | ORAL_TABLET | ORAL | Status: AC | PRN
  Administered 2020-12-19: 3 mg via ORAL
  Administered 2020-12-19: 1 mg via ORAL
  Administered 2020-12-19: 2 mg via ORAL
  Administered 2020-12-19 (×4): 1 mg via ORAL
  Filled 2020-12-16: qty 2
  Filled 2020-12-16 (×4): qty 1
  Filled 2020-12-16: qty 3
  Filled 2020-12-16: qty 1

## 2020-12-16 MED ORDER — SODIUM CHLORIDE 0.9 % IV BOLUS
1000.0000 mL | Freq: Once | INTRAVENOUS | Status: AC
  Administered 2020-12-16: 1000 mL via INTRAVENOUS

## 2020-12-16 MED ORDER — THIAMINE HCL 100 MG/ML IJ SOLN
100.0000 mg | Freq: Every day | INTRAMUSCULAR | Status: DC
  Administered 2020-12-17: 100 mg via INTRAVENOUS
  Filled 2020-12-16: qty 2

## 2020-12-16 MED ORDER — FOLIC ACID 1 MG PO TABS
1.0000 mg | ORAL_TABLET | Freq: Every day | ORAL | Status: DC
  Administered 2020-12-17 – 2020-12-20 (×4): 1 mg via ORAL
  Filled 2020-12-16 (×4): qty 1

## 2020-12-16 MED ORDER — LORAZEPAM 2 MG/ML IJ SOLN
2.0000 mg | Freq: Once | INTRAMUSCULAR | Status: AC
  Administered 2020-12-16: 2 mg via INTRAVENOUS
  Filled 2020-12-16: qty 1

## 2020-12-16 MED ORDER — LORAZEPAM 2 MG/ML IJ SOLN
1.0000 mg | INTRAMUSCULAR | Status: AC | PRN
Start: 2020-12-16 — End: 2020-12-19
  Administered 2020-12-17 (×2): 2 mg via INTRAVENOUS
  Administered 2020-12-17: 4 mg via INTRAVENOUS
  Administered 2020-12-17: 3 mg via INTRAVENOUS
  Administered 2020-12-17: 2 mg via INTRAVENOUS
  Filled 2020-12-16: qty 2
  Filled 2020-12-16: qty 1
  Filled 2020-12-16: qty 2
  Filled 2020-12-16 (×2): qty 1

## 2020-12-16 NOTE — ED Triage Notes (Addendum)
Pt arrived from home via [facility]. Patient is having auditory hallucinations. Pt is not aggressive. Pt reports he took 30 '300mg'$  XL tabs of Wellbutrin. Pt may have taken some '300mg'$  gabapentin, but he is unsure.   HR: 106 BP: 160/120 Temp: 101  20G in LAC

## 2020-12-16 NOTE — ED Notes (Signed)
Dr. Ralene Bathe contacted poison control.

## 2020-12-16 NOTE — ED Provider Notes (Signed)
[place] [facility] DEPT Provider Note   CSN: [id number] Arrival date & time: 12/16/20  2033     History Chief Complaint  Patient presents with  . Drug Overdose    Brian [name] is a 34 y.o. male.  The history is provided by the patient, medical records and the EMS personnel. No language interpreter was used.   Brian [name] is a 35 y.o. male who presents to the Emergency Department complaining of overdose. Level V caveat due to altered mental status. History is provided by EMS and the patient. EMS was called to his residence due to altered mental status. His roommates called [phone] him acting erratically. On their arrival he stated that he took approximately 3300 mg XL tablets of Wellbutrin over the last 24 hours. He also likely took gabapentin but he is unclear as to whether or not he took those recently. He complains of hallucinations. He denies any SI/HI.    Past Medical History:  Diagnosis Date  . Alcohol abuse   . Anxiety   . Bipolar 2 disorder ([place])   . Cocaine abuse ([place])   . Depression   . Kidney stones   . Narcotic abuse ([place])   . Peptic ulcer   . Polysubstance abuse ([place])   . Renal disorder    kidney stones    Patient Active Problem List   Diagnosis Date Noted  . 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 Gastroesophageal reflux disease 10/10/2020  . Bupropion overdose 08/13/2020  . Substance use disorder 08/13/2020  . Suicidal ideation 08/13/2020  . Pericarditis 07/02/2020  . Depression 07/02/2020  . Elevated troponin   . Non-ST elevation (NSTEMI) myocardial infarction ([place])   . Chest pain 06/19/2020  . Psychoactive substance-induced psychosis ([place]) 06/04/2020  . Malingering 06/04/2020  . Paranoia ([place])   . Amphetamine use disorder, severe, dependence ([place]) 03/26/2018  . Substance induced mood disorder ([place]) 03/26/2018  . Bipolar II disorder, severe, depressed, with anxious distress ([place]) 03/25/2018  . Cellulitis  05/20/2015  . Dysuria 05/20/2015  . Smoker 05/20/2015  . Homeless single person 05/20/2015  . Drug abuse ([place])   . Opioid use disorder, severe, dependence ([place]) 03/08/2015  . Ileus of unspecified type ([place]) 05/28/2014  . Abdominal pain 05/28/2014  . AKI (acute kidney injury) ([place]) 05/28/2014  . Esophagitis, acute 05/28/2014  . Polysubstance abuse ([place]) 05/28/2014  . Hematemesis 05/28/2014    Past Surgical History:  Procedure Laterality Date  . ESOPHAGOGASTRODUODENOSCOPY N/A 05/29/2014   Procedure: ESOPHAGOGASTRODUODENOSCOPY (EGD) ;  Surgeon: Beryle Beams, MD;  Location: [facility] ENDOSCOPY;  Service: Endoscopy;  Laterality: N/A;  check with Dr. Collene Mares about sedation type/timinng - I recommend MAC  . HAND SURGERY  2006  . LEFT HEART CATH AND CORONARY ANGIOGRAPHY N/A 06/21/2020   Procedure: LEFT HEART CATH AND CORONARY ANGIOGRAPHY;  Surgeon: Nelva Bush, MD;  Location: [place] CV LAB;  Service: Cardiovascular;  Laterality: N/A;       Family History  Problem Relation Age of Onset  . Heart disease Father     Social History   Tobacco Use  . Smoking status: Current Every Day Smoker    Packs/day: 0.50    Types: Cigarettes  . Smokeless tobacco: Never Used  Vaping Use  . Vaping Use: Never used  Substance Use Topics  . Alcohol use: Not Currently  . Drug use: Not Currently    Types: Marijuana, Cocaine, IV, Methamphetamines    Comment: heroin and meth    Home Medications  Prior to Admission medications   Medication Sig Start Date End Date Taking? Authorizing Provider  aspirin EC 81 MG EC tablet Take 1 tablet (81 mg total) by mouth daily. Swallow whole. 10/16/20   Money, Lowry Ram, FNP  atorvastatin (LIPITOR) 80 MG tablet Take 1 tablet (80 mg total) by mouth daily at 6 PM. 10/15/20   Money, Lowry Ram, FNP  buPROPion (WELLBUTRIN XL) 300 MG 24 hr tablet Take 1 tablet (300 mg total) by mouth daily. 11/21/20   Nwoko, Terese Door, PA  pantoprazole (PROTONIX) 40 MG tablet Take 1 tablet (40  mg total) by mouth 2 (two) times daily. 10/15/20 11/14/20  Money, Lowry Ram, FNP  QUEtiapine (SEROQUEL) 200 MG tablet Take 1 tablet (200 mg total) by mouth at bedtime. 11/21/20   Nwoko, Terese Door, PA  FLUoxetine (PROZAC) 20 MG capsule Take 1 capsule (20 mg total) by mouth daily. For depression Patient not taking: Reported on 11/04/2019 04/01/18 05/04/20  Lindell Spar I, NP  gabapentin (NEURONTIN) 300 MG capsule Take 2 capsules (600 mg total) by mouth 3 (three) times daily. For agitation Patient not taking: Reported on 05/02/2020 03/31/18 05/04/20  Lindell Spar I, NP  sucralfate (CARAFATE) 1 g tablet Take 1 tablet (1 g total) by mouth 4 (four) times daily -  with meals and at bedtime. Patient not taking: Reported on 11/04/2019 08/30/19 05/04/20  Joy, Helane Gunther, PA-C  traZODone (DESYREL) 50 MG tablet Take 1 tablet (50 mg total) by mouth at bedtime as needed for sleep. Patient not taking: Reported on 08/30/2019 03/31/18 08/30/19  Lindell Spar I, NP    Allergies    Fish allergy and Shellfish allergy  Review of Systems   Review of Systems  All other systems reviewed and are negative.   Physical Exam Updated Vital Signs BP (!) 147/88   Pulse 98   Temp 99.2 F (37.3 C) (Oral)   Resp 15   Ht _0  (1.727 m)   Wt 76.2 kg   SpO2 97%   BMI 25.54 kg/m   Physical Exam Vitals and nursing note reviewed.  Constitutional:      General: He is in acute distress.     Appearance: He is well-developed and well-nourished. He is ill-appearing.  HENT:     Head: Normocephalic and atraumatic.  Eyes:     Extraocular Movements: Extraocular movements intact.     Pupils: Pupils are equal, round, and reactive to light.  Cardiovascular:     Rate and Rhythm: Regular rhythm. Tachycardia present.     Heart sounds: No murmur heard.   Pulmonary:     Effort: Pulmonary effort is normal. No respiratory distress.     Breath sounds: Normal breath sounds.  Abdominal:     Palpations: Abdomen is soft.     Tenderness: There is no  abdominal tenderness. There is no guarding or rebound.  Musculoskeletal:        General: No tenderness or edema.  Skin:    General: Skin is warm and dry.     Capillary Refill: Capillary refill takes less than 2 seconds.  Neurological:     Mental Status: He is alert.     Comments: Moves all extremities symmetrically.  Psychiatric:        Mood and Affect: Mood and affect normal.     Comments: Agitated. Responding to internal stimuli.     ED Results / Procedures / Treatments   Labs (all labs ordered are listed, but only abnormal results are displayed) Labs  Reviewed  COMPREHENSIVE METABOLIC PANEL - Abnormal; Notable for the following components:      Result Value   Glucose, Bld 100 (*)    Creatinine, Ser 1.43 (*)    Total Protein 8.4 (*)    All other components within normal limits  SALICYLATE LEVEL - Abnormal; Notable for the following components:   Salicylate Lvl <6.7 (*)    All other components within normal limits  ACETAMINOPHEN LEVEL - Abnormal; Notable for the following components:   Acetaminophen (Tylenol), Serum <10 (*)    All other components within normal limits  SARS CORONAVIRUS 2 (TAT 6-24 HRS)  ETHANOL  CBC WITH DIFFERENTIAL/PLATELET  MAGNESIUM  RAPID URINE DRUG SCREEN, HOSP PERFORMED  URINALYSIS, ROUTINE W REFLEX MICROSCOPIC    EKG EKG Interpretation  Date/Time:  Sunday December 16 2020 20:55:04 EST Ventricular Rate:  110 PR Interval:    QRS Duration: 85 QT Interval:  317 QTC Calculation: 429 R Axis:   94 Text Interpretation: Sinus tachycardia Ventricular premature complex Consider right ventricular hypertrophy ST elev, probable normal early repol pattern Confirmed by Quintella Reichert [phone]) on 12/16/2020 10:03:31 PM   Radiology No results found.  Procedures Procedures  CRITICAL CARE Performed by: Quintella Reichert   Total critical care time: 35 minutes  Critical care time was exclusive of separately billable procedures and treating other  patients.  Critical care was necessary to treat or prevent imminent or life-threatening deterioration.  Critical care was time spent personally by me on the following activities: development of treatment plan with patient and/or surrogate as well as nursing, discussions with consultants, evaluation of patient's response to treatment, examination of patient, obtaining history from patient or surrogate, ordering and performing treatments and interventions, ordering and review of laboratory studies, ordering and review of radiographic studies, pulse oximetry and re-evaluation of patient's condition.  Medications Ordered in ED Medications  LORazepam (ATIVAN) injection 2 mg (2 mg Intravenous Given 12/16/20 2056)  sodium chloride 0.9 % bolus 1,000 mL (0 mLs Intravenous Stopped 12/16/20 2235)  LORazepam (ATIVAN) injection 2 mg (2 mg Intravenous Given 12/16/20 2235)    ED Course  I have reviewed the triage vital signs and the nursing notes.  Pertinent labs & imaging results that were available during my care of the patient were reviewed by me and considered in my medical decision making (see chart for details).    MDM Rules/Calculators/A&P                         Patient presents to the emergency department for evaluation following overdose on Wellbutrin. He told EMS that he takes it because he feels high like he is taking math. It is unclear the intent of his overdose at this time. It is also unclear if he took this over 24 hours or in one dose. On ED presentation he is agitated, hallucinating, tachycardic and hypertensive. He was treated with Ativan. On repeat assessment following Ativan he continues to be tachycardic, hallucinating pink and blue spiders throughout the room but does appear slightly, then initial evaluation. Will retreat with Ativan. Discussed the case with poison control, they recommend 24 hour obs. He is at risk for seizures and decompensation. Recommendation to replace electrolytes to  the high end of normal for potassium and magnesium and watch QT intervals. Hospitalist consulted for admission for ongoing treatment.  Final Clinical Impression(s) / ED Diagnoses Final diagnoses:  Drug overdose, undetermined intent, initial encounter    Rx / DC Orders ED  Discharge Orders    None       Quintella Reichert, MD 12/16/20 2243

## 2020-12-16 NOTE — ED Notes (Signed)
Patient's belongings labeled and placed in cabinet above 19-22 nurse's desk.

## 2020-12-17 DIAGNOSIS — F1010 Alcohol abuse, uncomplicated: Secondary | ICD-10-CM

## 2020-12-17 DIAGNOSIS — F32A Depression, unspecified: Secondary | ICD-10-CM

## 2020-12-17 DIAGNOSIS — T50901A Poisoning by unspecified drugs, medicaments and biological substances, accidental (unintentional), initial encounter: Secondary | ICD-10-CM | POA: Diagnosis present

## 2020-12-17 DIAGNOSIS — K279 Peptic ulcer, site unspecified, unspecified as acute or chronic, without hemorrhage or perforation: Secondary | ICD-10-CM

## 2020-12-17 DIAGNOSIS — F1990 Other psychoactive substance use, unspecified, uncomplicated: Secondary | ICD-10-CM

## 2020-12-17 LAB — RAPID URINE DRUG SCREEN, HOSP PERFORMED
Amphetamines: NOT DETECTED
Barbiturates: NOT DETECTED
Benzodiazepines: POSITIVE — AB
Cocaine: NOT DETECTED
Opiates: NOT DETECTED
Tetrahydrocannabinol: NOT DETECTED

## 2020-12-17 LAB — URINALYSIS, ROUTINE W REFLEX MICROSCOPIC
Bilirubin Urine: NEGATIVE
Glucose, UA: NEGATIVE mg/dL
Hgb urine dipstick: NEGATIVE
Ketones, ur: 5 mg/dL — AB
Leukocytes,Ua: NEGATIVE
Nitrite: NEGATIVE
Protein, ur: NEGATIVE mg/dL
Specific Gravity, Urine: 1.018 (ref 1.005–1.030)
pH: 5 (ref 5.0–8.0)

## 2020-12-17 LAB — MAGNESIUM
Magnesium: 1.8 mg/dL (ref 1.7–2.4)
Magnesium: 2.4 mg/dL (ref 1.7–2.4)
Magnesium: 2.5 mg/dL — ABNORMAL HIGH (ref 1.7–2.4)

## 2020-12-17 LAB — SARS CORONAVIRUS 2 (TAT 6-24 HRS): SARS Coronavirus 2: NEGATIVE

## 2020-12-17 LAB — POTASSIUM
Potassium: 4.2 mmol/L (ref 3.5–5.1)
Potassium: 4.5 mmol/L (ref 3.5–5.1)
Potassium: 3.8 mmol/L (ref 3.5–5.1)
Potassium: 3.6 mmol/L (ref 3.5–5.1)
Potassium: 4.4 mmol/L (ref 3.5–5.1)

## 2020-12-17 MED ORDER — SODIUM CHLORIDE 0.9 % IV SOLN: INTRAVENOUS | Status: AC

## 2020-12-17 MED ORDER — ENOXAPARIN SODIUM 40 MG/0.4ML ~~LOC~~ SOLN
40.0000 mg | SUBCUTANEOUS | Status: DC
  Administered 2020-12-17 – 2020-12-21 (×5): 40 mg via SUBCUTANEOUS
  Filled 2020-12-17 (×5): qty 0.4

## 2020-12-17 MED ORDER — MAGNESIUM SULFATE 2 GM/50ML IV SOLN
2.0000 g | Freq: Once | INTRAVENOUS | Status: AC
  Administered 2020-12-17: 2 g via INTRAVENOUS
  Filled 2020-12-17: qty 50

## 2020-12-17 MED ORDER — PANTOPRAZOLE SODIUM 40 MG PO TBEC
40.0000 mg | DELAYED_RELEASE_TABLET | Freq: Two times a day (BID) | ORAL | Status: DC
  Administered 2020-12-17 – 2020-12-21 (×10): 40 mg via ORAL
  Filled 2020-12-17 (×10): qty 1

## 2020-12-17 MED ORDER — POTASSIUM CHLORIDE CRYS ER 20 MEQ PO TBCR
40.0000 meq | EXTENDED_RELEASE_TABLET | Freq: Once | ORAL | Status: AC
  Administered 2020-12-17: 40 meq via ORAL
  Filled 2020-12-17: qty 2

## 2020-12-17 MED ORDER — NICOTINE 14 MG/24HR TD PT24
14.0000 mg | MEDICATED_PATCH | Freq: Every day | TRANSDERMAL | Status: DC
  Administered 2020-12-19: 14 mg via TRANSDERMAL
  Filled 2020-12-17 (×4): qty 1

## 2020-12-17 NOTE — ED Notes (Signed)
Patient's mother called and update given.

## 2020-12-17 NOTE — Progress Notes (Signed)
PROGRESS NOTE  Brian [name] [id number] DOB: 05-07-1986   PCP: Marliss Coots, NP  Patient is from: Home  DOA: 12/16/2020 LOS: 0  Chief complaints: Altered mental status, overdose  Brief Narrative / Interim history: 35 year old M with PMH of polysubstance abuse (EtOH, cocaine, opiates, marijuana, meth, tobacco, BPD, anxiety, depression, PUD and prior intentional OD with Wellbutrin in 07/2020 brought to ED by EMS after he had an overdose with 30 tablets of Wellbutrin XL 300 mg and unknown quantity of gabapentin.  He had audiovisual hallucination, confusion and agitation.  He was admitted for intentional overdose with Wellbutrin and unknown quantity of gabapentin. Poison control and psychiatry consulted.  Subjective: Seen and examined earlier this morning.  No major events.  He denies headache, chest pain, dyspnea, GI or UTI symptoms.  Reports seeing flowers and hearing voices. He says he was just trying to use Wellbutrin and gabapentin to get highs since he stopped street drug.   Objective: Vitals:   12/17/20 0919 12/17/20 1000 12/17/20 1230 12/17/20 1300  BP: (!) 140/98 114/83 107/75   Pulse: 87 81 74 91  Resp:  13 11   Temp:  98.8 F (37.1 C)    TempSrc:      SpO2:  98% 100%   Weight:      Height:        Intake/Output Summary (Last 24 hours) at 12/17/2020 1453 Last data filed at 12/17/2020 1000 Gross per 24 hour  Intake 1120 ml  Output --  Net 1120 ml   Filed Weights   12/16/20 2045  Weight: 76.2 kg    Examination:  GENERAL: No apparent distress.  Nontoxic. HEENT: MMM.  Vision and hearing grossly intact.  NECK: Supple.  No apparent JVD.  RESP: On RA.  No IWOB.  Fair aeration bilaterally. CVS:  RRR. Heart sounds normal.  ABD/GI/GU: BS+. Abd soft, NTND.  MSK/EXT:  Moves extremities. No apparent deformity. No edema.  Bilateral wrist and ankle restraints SKIN: no apparent skin lesion or wound NEURO: Awake, alert and oriented appropriately.  No apparent focal  neuro deficit. PSYCH: Calm.  Reports audiovisual hallucination.  Procedures:  None  Microbiology summarized: COVID-19 PCR nonreactive.  Assessment & Plan: Intentional overdose-reportedly took 30 tablets of Wellbutrin XL 300 mg an unknown quantity of gabapentin.  Prior history of this in 07/2020 when he was admitted to inpatient psych.  -Poison control recs-monitor K, Mg, QT and seizure -Psychiatry consulted -Discontinue physical restraints. -Continue suicide and safety sitter -Ativan as needed for agitation/seizure.   History of alcohol abuse -CIWA protocol with Ativan, thiamine, multivitamin with folic acid  History of polysubstance use-UDS positive for benzo but received ativan here -TOC consult  Tobacco use disorder -NicoDerm patch and counseling  Anxiety, depression, bipolar disorder -Hold Wellbutrin and Seroquel.  Not on Prozac or trazodone per pharmacy med rec. -Psychiatry to see patient  PUD -Continue PPI  Body mass index is 25.54 kg/m.         DVT prophylaxis:  enoxaparin (LOVENOX) injection 40 mg Start: 12/17/20 1000  Code Status: Home Family Communication: Patient and/or RN. Available if any question.  Level of care: Stepdown.  Transfer to telemetry Status is: Observation  The patient will require care spanning > 2 midnights and should be moved to inpatient because: Altered mental status, Unsafe d/c plan, IV treatments appropriate due to intensity of illness or inability to take PO and Inpatient level of care appropriate due to severity of illness  Dispo: The patient is from: Home  Anticipated d/c is to: To be determined              Anticipated d/c date is: 2 days              Patient currently is not medically stable to d/c.   Difficult to place patient No       Consultants:  Psychiatry Poison control   Sch Meds:  Scheduled Meds: . enoxaparin (LOVENOX) injection  40 mg Subcutaneous Q24H  . folic acid  1 mg Oral Daily  .  multivitamin with minerals  1 tablet Oral Daily  . nicotine  14 mg Transdermal Daily  . pantoprazole  40 mg Oral BID  . thiamine  100 mg Oral Daily   Or  . thiamine  100 mg Intravenous Daily   Continuous Infusions: PRN Meds:.LORazepam **OR** LORazepam  Antimicrobials: Anti-infectives (From admission, onward)   None       I have personally reviewed the following labs and images: CBC: Recent Labs  Lab 12/16/20 2048  WBC 9.6  NEUTROABS 7.1  HGB 14.4  HCT 44.3  MCV 90.2  PLT 290   BMP &GFR Recent Labs  Lab 12/16/20 2048 12/17/20 0124 12/17/20 0451 12/17/20 0918 12/17/20 1319  NA 137  --   --   --   --   K 4.3 4.2 4.5 3.8 3.6  CL 104  --   --   --   --   CO2 22  --   --   --   --   GLUCOSE 100*  --   --   --   --   BUN 18  --   --   --   --   CREATININE 1.43*  --   --   --   --   CALCIUM 9.4  --   --   --   --   MG 1.8 1.8 2.4 2.5*  --    Estimated Creatinine Clearance: 69.1 mL/min (A) (by C-G formula based on SCr of 1.43 mg/dL (H)). Liver & Pancreas: Recent Labs  Lab 12/16/20 2048  AST 28  ALT 33  ALKPHOS 76  BILITOT 0.5  PROT 8.4*  ALBUMIN 4.5   No results for input(s): LIPASE, AMYLASE in the last 168 hours. No results for input(s): AMMONIA in the last 168 hours. Diabetic: No results for input(s): HGBA1C in the last 72 hours. No results for input(s): GLUCAP in the last 168 hours. Cardiac Enzymes: No results for input(s): CKTOTAL, CKMB, CKMBINDEX, TROPONINI in the last 168 hours. No results for input(s): PROBNP in the last 8760 hours. Coagulation Profile: No results for input(s): INR, PROTIME in the last 168 hours. Thyroid Function Tests: No results for input(s): TSH, T4TOTAL, FREET4, T3FREE, THYROIDAB in the last 72 hours. Lipid Profile: No results for input(s): CHOL, HDL, LDLCALC, TRIG, CHOLHDL, LDLDIRECT in the last 72 hours. Anemia Panel: No results for input(s): VITAMINB12, FOLATE, FERRITIN, TIBC, IRON, RETICCTPCT in the last 72 hours. Urine  analysis:    Component Value Date/Time   COLORURINE YELLOW 12/17/2020 0724   APPEARANCEUR CLEAR 12/17/2020 0724   LABSPEC 1.018 12/17/2020 0724   PHURINE 5.0 12/17/2020 0724   GLUCOSEU NEGATIVE 12/17/2020 0724   HGBUR NEGATIVE 12/17/2020 0724   BILIRUBINUR NEGATIVE 12/17/2020 0724   KETONESUR 5 (A) 12/17/2020 0724   PROTEINUR NEGATIVE 12/17/2020 0724   UROBILINOGEN 0.2 05/21/2015 0116   NITRITE NEGATIVE 12/17/2020 0724   LEUKOCYTESUR NEGATIVE 12/17/2020 0724   Sepsis Labs: Invalid input(s): PROCALCITONIN, LACTICIDVEN  Microbiology: Recent Results (from the past 240 hour(s))  SARS CORONAVIRUS 2 (TAT 6-24 HRS) Nasopharyngeal Nasopharyngeal Swab     Status: None   Collection Time: 12/16/20  8:52 PM   Specimen: Nasopharyngeal Swab  Result Value Ref Range Status   SARS Coronavirus 2 NEGATIVE NEGATIVE Final    Comment: (NOTE) SARS-CoV-2 target nucleic acids are NOT DETECTED.  The SARS-CoV-2 RNA is generally detectable in upper and lower respiratory specimens during the acute phase of infection. Negative results do not preclude SARS-CoV-2 infection, do not rule out co-infections with other pathogens, and should not be used as the sole basis for treatment or other patient management decisions. Negative results must be combined with clinical observations, patient history, and epidemiological information. The expected result is Negative.  Fact Sheet for Patients: SugarRoll.be  Fact Sheet for Healthcare Providers: https://www.woods-mathews.com/  This test is not yet approved or cleared by the [place] FDA and  has been authorized for detection and/or diagnosis of SARS-CoV-2 by FDA under an Emergency Use Authorization (EUA). This EUA will remain  in effect (meaning this test can be used) for the duration of the COVID-19 declaration under Se ction 564(b)(1) of the Act, 21 U.S.C. section 360bbb-3(b)(1), unless the authorization is  terminated or revoked sooner.  Performed at [facility], [place] [address], [place], [place] [address]     Radiology Studies: No results found.   Mikah Rottinghaus T. [place]  If 7PM-7AM, please contact night-coverage [email or web address] 12/17/2020, 2:53 PM

## 2020-12-17 NOTE — ED Notes (Signed)
Patient continually talking to himself and saying things that do not make sense.

## 2020-12-17 NOTE — Consult Note (Signed)
  Attempted to assist patient, however patient is disoriented, confused and exhibiting nonsensical speech.  Will attempt to reassess once patient is medically stable and on the unit.

## 2020-12-17 NOTE — ED Notes (Signed)
Dr. Marlowe Sax paged about patient becoming increasingly agitated and pulling everything off despite mittens.

## 2020-12-17 NOTE — ED Notes (Signed)
Pt continues to tear off every piece of monitoring equipment that we place.

## 2020-12-17 NOTE — ED Notes (Signed)
Pt asked for assistance to male staff to help with a urinal. Male staff arrived to provide assistance at which time the patient did not want any assistance. No urine sample able to be provided at this point.

## 2020-12-17 NOTE — ED Notes (Signed)
Dr. Marlowe Sax aware of patient CIWA score >20.

## 2020-12-17 NOTE — ED Notes (Signed)
Pt remains in restraints at this time during report - sitter at bedside

## 2020-12-17 NOTE — ED Notes (Signed)
Patient provided with water to drink.

## 2020-12-17 NOTE — ED Notes (Signed)
This RN spoke to poison control and gave an update on patient's status.

## 2020-12-17 NOTE — H&P (Signed)
History and Physical    Brian [name] [id number] DOB: [birth year] DOA: 12/16/2020  PCP: Marliss Coots, NP Patient coming from: Home  Chief Complaint: AMS, drug overdose  HPI: Brian [name] is a 35 y.o. male with medical history significant of polysubstance abuse (alcohol, cocaine, opiates, marijuana, methamphetamines, tobacco), anxiety, bipolar disorder, depression, PUD, hospital admission in October 2021 for intentional Wellbutrin overdose with suicidal ideation discharged to inpatient psych presenting to the ED today via EMS for evaluation of altered mental status.  It is reported that EMS was called to his house as his roommates felt that he was acting erratically.  Patient told EMS that he took 30 tablets of Wellbutrin XL 300 mg over the last 24 hours.  He also likely took gabapentin but unclear whether or not he took dose recently.  Patient was having hallucinations.   No history could be obtained from the patient given his altered mental status.  He is disoriented and hallucinating.   ED Course: Agitated, hallucinating, and tachycardic.  He was given Ativan and 1 L normal saline bolus.  Poison control recommended monitoring levels of potassium, magnesium, and QT intervals.  Recommended monitoring for seizures.  Lab work showing WBC 9.6, hemoglobin 14.4, platelet count 290K.  Sodium 137, potassium 4.3, chloride 104, bicarb 22, BUN 18, creatinine 1.4 (baseline 1.0-1.3), glucose 100.  LFTs normal.  Blood ethanol, salicylate, and acetaminophen levels undetectable.  Magnesium 1.8.  UA and UDS pending.  Screening SARS-CoV-2 PCR test pending.  EKG showing sinus tachycardia and QTC 429.  Review of Systems:  All systems reviewed and apart from history of presenting illness, are negative.  Past Medical History:  Diagnosis Date  . Alcohol abuse   . Anxiety   . Bipolar 2 disorder ([place])   . Cocaine abuse ([place])   . Depression   . Kidney stones   . Narcotic abuse ([place])   . Peptic  ulcer   . Polysubstance abuse ([place])   . Renal disorder    kidney stones    Past Surgical History:  Procedure Laterality Date  . ESOPHAGOGASTRODUODENOSCOPY N/A 05/29/2014   Procedure: ESOPHAGOGASTRODUODENOSCOPY (EGD) ;  Surgeon: Beryle Beams, MD;  Location: [facility] ENDOSCOPY;  Service: Endoscopy;  Laterality: N/A;  check with Dr. Collene Mares about sedation type/timinng - I recommend MAC  . HAND SURGERY  2006  . LEFT HEART CATH AND CORONARY ANGIOGRAPHY N/A 06/21/2020   Procedure: LEFT HEART CATH AND CORONARY ANGIOGRAPHY;  Surgeon: Nelva Bush, MD;  Location: [facility];  Service: Cardiovascular;  Laterality: N/A;     reports that he has been smoking cigarettes. He has been smoking about 0.50 packs per day. He has never used smokeless tobacco. He reports previous alcohol use. He reports previous drug use. Drugs: Marijuana, Cocaine, IV, and Methamphetamines.  Allergies  Allergen Reactions  . Fish Allergy Anaphylaxis  . Shellfish Allergy Anaphylaxis    Family History  Problem Relation Age of Onset  . Heart disease Father     Prior to Admission medications   Medication Sig Start Date End Date Taking? Authorizing Provider  buPROPion (WELLBUTRIN XL) 300 MG 24 hr tablet Take 1 tablet (300 mg total) by mouth daily. 11/21/20  Yes Nwoko, Uchenna E, PA  gabapentin (NEURONTIN) 300 MG capsule Take 300 mg by mouth 3 (three) times daily. 12/03/20  Yes [provider]  pantoprazole (PROTONIX) 40 MG tablet Take 1 tablet (40 mg total) by mouth 2 (two) times daily. 10/15/20 11/14/20 Yes Money, Lowry Ram, FNP  QUEtiapine (SEROQUEL) 200 MG tablet Take 1 tablet (200 mg total) by mouth at bedtime. 11/21/20  Yes Nwoko, Uchenna E, PA  aspirin EC 81 MG EC tablet Take 1 tablet (81 mg total) by mouth daily. Swallow whole. Patient not taking: No sig reported 10/16/20   Money, Lowry Ram, FNP  atorvastatin (LIPITOR) 80 MG tablet Take 1 tablet (80 mg total) by mouth daily at 6 PM. Patient not taking: No sig  reported 10/15/20   Money, Lowry Ram, FNP  FLUoxetine (PROZAC) 20 MG capsule Take 1 capsule (20 mg total) by mouth daily. For depression Patient not taking: Reported on 11/04/2019 04/01/18 05/04/20  Lindell Spar I, NP  sucralfate (CARAFATE) 1 g tablet Take 1 tablet (1 g total) by mouth 4 (four) times daily -  with meals and at bedtime. Patient not taking: Reported on 11/04/2019 08/30/19 05/04/20  Joy, Helane Gunther, PA-C  traZODone (DESYREL) 50 MG tablet Take 1 tablet (50 mg total) by mouth at bedtime as needed for sleep. Patient not taking: Reported on 08/30/2019 03/31/18 08/30/19  Lindell Spar I, NP    Physical Exam: Vitals:   12/16/20 2240 12/16/20 2324 12/16/20 2344 12/17/20 0000  BP: (!) 148/108 (!) 120/93 (!) 120/93 (!) 137/94  Pulse: (!) 45 (!) 115 (!) 115 (!) 110  Resp: 15 (!) 23  20  Temp:      TempSrc:      SpO2: 94% 98%  100%  Weight:      Height:        Physical Exam Constitutional:      General: He is not in acute distress. HENT:     Head: Normocephalic and atraumatic.  Eyes:     Extraocular Movements: Extraocular movements intact.  Cardiovascular:     Rate and Rhythm: Regular rhythm. Tachycardia present.     Pulses: Normal pulses.     Comments: Slightly tachycardic Pulmonary:     Effort: Pulmonary effort is normal. No respiratory distress.     Breath sounds: No wheezing or rales.  Abdominal:     General: Bowel sounds are normal. There is no distension.     Palpations: Abdomen is soft.     Tenderness: There is no abdominal tenderness.  Musculoskeletal:        General: No swelling or tenderness.     Cervical back: Normal range of motion and neck supple.  Skin:    General: Skin is warm and dry.  Neurological:     Mental Status: He is alert.     Comments: Oriented to self only Hallucinating Restless and fidgety, trying to remove his hospital gown     Labs on Admission: I have personally reviewed following labs and imaging studies  CBC: Recent Labs  Lab 12/16/20 2048   WBC 9.6  NEUTROABS 7.1  HGB 14.4  HCT 44.3  MCV 90.2  PLT 993   Basic Metabolic Panel: Recent Labs  Lab 12/16/20 2048  NA 137  K 4.3  CL 104  CO2 22  GLUCOSE 100*  BUN 18  CREATININE 1.43*  CALCIUM 9.4  MG 1.8   GFR: Estimated Creatinine Clearance: 69.1 mL/min (A) (by C-G formula based on SCr of 1.43 mg/dL (H)). Liver Function Tests: Recent Labs  Lab 12/16/20 2048  AST 28  ALT 33  ALKPHOS 76  BILITOT 0.5  PROT 8.4*  ALBUMIN 4.5   No results for input(s): LIPASE, AMYLASE in the last 168 hours. No results for input(s): AMMONIA in the last 168 hours. Coagulation Profile:  No results for input(s): INR, PROTIME in the last 168 hours. Cardiac Enzymes: No results for input(s): CKTOTAL, CKMB, CKMBINDEX, TROPONINI in the last 168 hours. BNP (last 3 results) No results for input(s): PROBNP in the last 8760 hours. HbA1C: No results for input(s): HGBA1C in the last 72 hours. CBG: No results for input(s): GLUCAP in the last 168 hours. Lipid Profile: No results for input(s): CHOL, HDL, LDLCALC, TRIG, CHOLHDL, LDLDIRECT in the last 72 hours. Thyroid Function Tests: No results for input(s): TSH, T4TOTAL, FREET4, T3FREE, THYROIDAB in the last 72 hours. Anemia Panel: No results for input(s): VITAMINB12, FOLATE, FERRITIN, TIBC, IRON, RETICCTPCT in the last 72 hours. Urine analysis:    Component Value Date/Time   COLORURINE STRAW (A) 10/14/2020 1205   APPEARANCEUR CLEAR 10/14/2020 1205   LABSPEC 1.005 10/14/2020 1205   PHURINE 8.0 10/14/2020 1205   GLUCOSEU NEGATIVE 10/14/2020 1205   [place] 10/14/2020 [place] 10/14/2020 [place] 10/14/2020 [place] 10/14/2020 1205   UROBILINOGEN 0.2 05/21/2015 0116   NITRITE NEGATIVE 10/14/2020 1205   LEUKOCYTESUR NEGATIVE 10/14/2020 1205    Radiological Exams on Admission: No results found.  EKG: Independently reviewed.  Sinus tachycardia, QTC  429.  Assessment/Plan Principal Problem:   Bupropion overdose Active Problems:   Depression   Substance use disorder   Alcohol abuse   PUD (peptic ulcer disease)   Intentional bupropion overdose: Patient reportedly took 30 tablets of Wellbutrin XL 300 mg over the last 24 hours.  Agitated, hallucinating, and tachycardic in the ED. History of anxiety, depression, bipolar disorder. Admitted for bupropion overdose back in October 2021 as well and was discharged to an inpatient psych facility at that time. -Psychiatry consulted -Suicide precautions, safety sitter -Admit to stepdown unit -ED physician consulted poison control who recommended monitoring potassium and magnesium levels, QT interval, and monitoring for seizures. -IV fluid hydration -Initial EKG showing QTC 429.  Do serial EKGs every 4 hours to monitor for QT/QRS prolongation or arrhythmia -Monitor K and Mag every 4 hours. Keep K >4 and Mag >2 to decrease risk of arrhythmia. -At risk for seizures, follow seizure precautions -Ativan as needed for agitation/ seizures.  Avoid Haldol for agitation if possible to decrease risk of QT prolongation/arrhythmia.  History of alcohol abuse -CIWA protocol; Ativan as needed.  Thiamine, folate, and multivitamin.  Substance use disorder -UDS pending -TOC consult  Tobacco use -NicoDerm patch and counseling  Anxiety, depression, bipolar disorder -Hold Wellbutrin and Seroquel.  Not on Prozac or trazodone per pharmacy med rec. -Psychiatry consulted.  PUD -Continue PPI  DVT prophylaxis: Lovenox Code Status: Full code Family Communication: No family available at this time. Disposition Plan: Status is: Observation  The patient remains OBS appropriate and will d/c before 2 midnights.  Dispo: The patient is from: Home              Anticipated d/c is to: Likely to inpatient psych facility, pending psych eval              Anticipated d/c date is: 2 days              Patient currently is  not medically stable to d/c.   Difficult to place patient No  Level of care: Level of care: Stepdown   The medical decision making on this patient was of high complexity and the patient is at high risk for clinical deterioration, therefore this is a level 3 visit.  Shela Leff  MD [facility]  If 7PM-7AM, please contact night-coverage [email or web address]  12/17/2020, 12:42 AM

## 2020-12-18 ENCOUNTER — Other Ambulatory Visit: Payer: Self-pay

## 2020-12-18 DIAGNOSIS — F333 Major depressive disorder, recurrent, severe with psychotic symptoms: Secondary | ICD-10-CM

## 2020-12-18 DIAGNOSIS — T50904A Poisoning by unspecified drugs, medicaments and biological substances, undetermined, initial encounter: Secondary | ICD-10-CM

## 2020-12-18 DIAGNOSIS — T43292A Poisoning by other antidepressants, intentional self-harm, initial encounter: Secondary | ICD-10-CM | POA: Diagnosis not present

## 2020-12-18 LAB — RENAL FUNCTION PANEL
Albumin: 3.9 g/dL (ref 3.5–5.0)
Anion gap: 10 (ref 5–15)
BUN: 15 mg/dL (ref 6–20)
CO2: 22 mmol/L (ref 22–32)
Calcium: 8.8 mg/dL — ABNORMAL LOW (ref 8.9–10.3)
Chloride: 104 mmol/L (ref 98–111)
Creatinine, Ser: 1.2 mg/dL (ref 0.61–1.24)
GFR, Estimated: >60 mL/min (ref >60)
Glucose, Bld: 90 mg/dL (ref 70–99)
Phosphorus: 3.3 mg/dL (ref 2.5–4.6)
Potassium: 3.8 mmol/L (ref 3.5–5.1)
Sodium: 136 mmol/L (ref 135–145)

## 2020-12-18 LAB — MAGNESIUM: Magnesium: 2.2 mg/dL (ref 1.7–2.4)

## 2020-12-18 LAB — CBC
HCT: 41.4 % (ref 39.0–52.0)
Hemoglobin: 13.4 g/dL (ref 13.0–17.0)
MCH: 29.6 pg (ref 26.0–34.0)
MCHC: 32.4 g/dL (ref 30.0–36.0)
MCV: 91.6 fL (ref 80.0–100.0)
Platelets: 235 10*3/uL (ref 150–400)
RBC: 4.52 MIL/uL (ref 4.22–5.81)
RDW: 14.2 % (ref 11.5–15.5)
WBC: 7.4 10*3/uL (ref 4.0–10.5)
nRBC: 0 % (ref 0.0–0.2)

## 2020-12-18 MED ORDER — GABAPENTIN 300 MG PO CAPS
300.0000 mg | ORAL_CAPSULE | Freq: Three times a day (TID) | ORAL | Status: DC
  Administered 2020-12-18 – 2020-12-21 (×9): 300 mg via ORAL
  Filled 2020-12-18 (×9): qty 1

## 2020-12-18 MED ORDER — ZIPRASIDONE MESYLATE 20 MG IM SOLR
20.0000 mg | Freq: Two times a day (BID) | INTRAMUSCULAR | Status: DC | PRN
  Administered 2020-12-18: 20 mg via INTRAMUSCULAR
  Filled 2020-12-18 (×2): qty 20

## 2020-12-18 MED ORDER — QUETIAPINE FUMARATE 100 MG PO TABS
200.0000 mg | ORAL_TABLET | Freq: Every day | ORAL | Status: DC
Start: 2020-12-18 — End: 2020-12-21
  Administered 2020-12-19 – 2020-12-20 (×3): 200 mg via ORAL
  Filled 2020-12-18 (×3): qty 2

## 2020-12-18 NOTE — TOC Initial Note (Signed)
Transition of Care [facility]) - Initial/Assessment Note    Patient Details  Name: Brian [name] MRN: [id number] Date of Birth: September 18, 1986  [facility] CM/SW Contact:    Trish Mage, LCSW Phone Number: 12/18/2020, 2:10 PM  Clinical Narrative:   Received message from MD that patient is medically stable.  Asked both [facility] and [facility] to review for possible placement. TOC will continue to follow during the course of hospitalization.                 Expected Discharge Plan: Psychiatric Hospital Barriers to Discharge: Psych Bed not available   Patient Goals and CMS Choice        Expected Discharge Plan and Services Expected Discharge Plan: [facility]                                              Prior Living Arrangements/Services                       Activities of Daily Living Home Assistive Devices/Equipment: None ADL Screening (condition at time of admission) Patient's cognitive ability adequate to safely complete daily activities?: No (patient heavily sedated) Is the patient deaf or have difficulty hearing?: No Does the patient have difficulty seeing, even when wearing glasses/contacts?: No Does the patient have difficulty concentrating, remembering, or making decisions?: Yes Patient able to express need for assistance with ADLs?: Yes Does the patient have difficulty dressing or bathing?: No Independently performs ADLs?: Yes (appropriate for developmental age) Does the patient have difficulty walking or climbing stairs?: No Weakness of Legs: None Weakness of Arms/Hands: None  Permission Sought/Granted                  Emotional Assessment              Admission diagnosis:  Drug overdose [T50.901A] Drug overdose, undetermined intent, initial encounter [T50.904A] Patient Active Problem List   Diagnosis Date Noted  . Alcohol abuse 12/17/2020  . PUD (peptic ulcer disease) 12/17/2020  . Drug overdose 12/17/2020  .  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[place]) 10/10/2020  . Gastroesophageal reflux disease 10/10/2020  . Bupropion overdose 08/13/2020  . Substance use disorder 08/13/2020  . Suicidal ideation 08/13/2020  . Pericarditis 07/02/2020  . Depression 07/02/2020  . Elevated troponin   . Non-ST elevation (NSTEMI) myocardial infarction ([place])   . Chest pain 06/19/2020  . 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 Malingering 06/04/2020  . Paranoia ([place])   . 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 Substance induced mood disorder ([place]) 03/26/2018  . Bipolar II disorder, severe, depressed, with anxious distress ([place]) 03/25/2018  . Cellulitis 05/20/2015  . Dysuria 05/20/2015  . Smoker 05/20/2015  . Homeless single person 05/20/2015  . Drug abuse ([place])   . Opioid use disorder, severe, dependence ([place]) 03/08/2015  . Ileus of unspecified type ([place]) 05/28/2014  . Abdominal pain 05/28/2014  . AKI (acute kidney injury) ([place]) 05/28/2014  . Esophagitis, acute 05/28/2014  . Polysubstance abuse ([place]) 05/28/2014  . Hematemesis 05/28/2014   PCP:  Marliss Coots, NP Pharmacy:   [facility], [place] [address] [place] [address] Phone: [phone] Fax: [phone]  [id number] [place] [address] Phone: [phone] Fax: [place], [place] [address] [place] [address] Phone: [phone] Fax: [phone]     Social Determinants of Health (SDOH) Interventions    Readmission Risk Interventions No flowsheet data found.

## 2020-12-18 NOTE — Progress Notes (Addendum)
Paged by patient's RN about patient threatening to leave the hospital.  Psychiatry saw him earlier in the day and recommended inpatient admission to psychiatric hospital, and IVC if he attempts to leave.   Patient IVC order in computer.  Went to saw patient with hospital security.  Patient says "I want to leave and get high".  He does not recall his encounter with me or the psychiatrist in the day.  He also states he does want to go to the psychiatric hospital in [place]. He asks if he could be arrested if he tries to walk out of the hospital.  I explained to him the reason for inpatient psychiatric hospital recommendation by psychiatry.  I also explained to him that he is now involuntary committed.   RN contacting [facility] for IVC paperwork which will be singed as soon as it is ready.  I have also activated agitation protocol with as needed Geodon.  He is already on as needed Ativan for CIWA.   Placed psych consult for re-evaluation in the morning  Addendum Patient pulled his IVs and getting agitated. -Four-point restraints ordered.

## 2020-12-18 NOTE — Progress Notes (Signed)
Spoke to poison control. His case was closed last night 2/21 because it had been over 24 hrs, his ekg was clear and vitals stable. "They said worsening symptoms were not to be expected." MD was notified.

## 2020-12-18 NOTE — Consult Note (Signed)
[facility] Face to Face Consultation   Reason for Consult:  Suicide attempt Referring Physician:  Cyndia Skeeters Location of Patient: [facility]   Patient Identification: Brian [name] MRN:  [id number] Principal Diagnosis: Bupropion overdose Diagnosis:  Principal Problem:   Bupropion overdose Active Problems:   Depression   Substance use disorder   Alcohol abuse   PUD (peptic ulcer disease)   Drug overdose   Total Time spent with patient: 30 minutes  Subjective:  "The last few days I been feeling down, and self medicating. Ive been self-medicating and I knew it was wrong. I have been abusing the Wellbutin, I noticed it gave me an up feeling. I filled it Friday and it was gone by yesterday. I also found a prescription Gabapentin and started taking it, it lasted me about 1.5 weeks ago. I am beating myself about it and struggling with my disease. I have been acting real manic here lately. I have been forgetful, repeating things, confusion and lost my job. I am having suicidal thoughts, but I dont have a plan. I know I need help.    HPI:   Brian [name] is a 35 y.o. male with medical history significant of polysubstance abuse (alcohol, cocaine, opiates, marijuana, methamphetamines, tobacco), anxiety, bipolar disorder, depression, PUD, hospital admission in October 2021 for intentional Wellbutrin overdose with suicidal ideation discharged to inpatient psych presenting to the ED today via EMS for evaluation of altered mental status.  It is reported that EMS was called to his house as his roommates felt that he was acting erratically.  Patient told EMS that he took 30 tablets of Wellbutrin XL 300 mg over the last 24 hours.  He also likely took gabapentin but unclear whether or not he took dose recently.  Patient was having hallucinations.    During the hospitalization patient was alert and oriented, calm and cooperative, pleasant today. He continues to be very blunt and matter fact, no eye contact but  engaged well.  He appears to be well informed about substance abuse services and resources in the area.  He reports recently completion of substance abuse program at [facility] in [place].  He states from there he went to sober living of [facility], and then transferred to an [facility].  He is hopeful to return back to the [facility].  At present he denies any psychosis, paranoia, auditory visual hallucinations.  He continues to endorse suicidal ideations without a plan.  Patient does have a significant psychiatric history to include substance abuse, in which she has been clean for months.  He has a history of multiple suicide attempts, suicidal threats, and suicidal gestures.  He is able to contract for safety while in the hospital. He has had multiple inpatient psychiatric admissions and multiple substance abuse treatment facilities most recently last month. Considering his most recent attempt will recommend inpatient at this time.    He continues to endorse suicidal ideations and suicidal behaviors at this time.   Past Psychiatric History: Polysubstance use disorder to include alcohol, cocaine, opiates, marijuana, methamphetamines and tobacco.  Previous psychiatric diagnosis of anxiety, bipolar depression, suicide attempts.  Most recent hospitalization in October 2021 for intentional Wellbutrin overdose with suicidal ideations, in which she was admitted to [facility] to Self:  Yes Risk to Others:  No Prior Inpatient Therapy:  Yes Prior Outpatient Therapy:  Yes  Past Medical History:  Past Medical History:  Diagnosis Date  . Alcohol abuse   . Anxiety   .  Bipolar 2 disorder ([place])   . Cocaine abuse ([place])   . Depression   . Kidney stones   . Narcotic abuse ([place])   . Peptic ulcer   . Polysubstance abuse ([place])   . Renal disorder    kidney stones    Past Surgical History:  Procedure Laterality Date  . ESOPHAGOGASTRODUODENOSCOPY N/A 05/29/2014   Procedure:  ESOPHAGOGASTRODUODENOSCOPY (EGD) ;  Surgeon: Beryle Beams, MD;  Location: [facility] ENDOSCOPY;  Service: Endoscopy;  Laterality: N/A;  check with Dr. Collene Mares about sedation type/timinng - I recommend MAC  . HAND SURGERY  2006  . LEFT HEART CATH AND CORONARY ANGIOGRAPHY N/A 06/21/2020   Procedure: LEFT HEART CATH AND CORONARY ANGIOGRAPHY;  Surgeon: Nelva Bush, MD;  Location: [facility];  Service: Cardiovascular;  Laterality: N/A;   Family History:  Family History  Problem Relation Age of Onset  . Heart disease Father    Family Psychiatric  History: None reported Social History:  Social History   Substance and Sexual Activity  Alcohol Use Not Currently     Social History   Substance and Sexual Activity  Drug Use Not Currently  . Types: Marijuana, Cocaine, IV, Methamphetamines   Comment: heroin and meth    Social History   Socioeconomic History  . Marital status: Single    Spouse name: Not on file  . Number of children: Not on file  . Years of education: Not on file  . Highest education level: Not on file  Occupational History  . Not on file  Tobacco Use  . Smoking status: Current Every Day Smoker    Packs/day: 0.50    Types: Cigarettes  . Smokeless tobacco: Never Used  Vaping Use  . Vaping Use: Never used  Substance and Sexual Activity  . Alcohol use: Not Currently  . Drug use: Not Currently    Types: Marijuana, Cocaine, IV, Methamphetamines    Comment: heroin and meth  . Sexual activity: Not on file  Other Topics Concern  . Not on file  Social History Narrative   Lives with a friend   Lost house and custody of son 9broke up with fiancee)   Nature conservation officer   Social Determinants of Radio broadcast assistant Strain: Not on file  Food Insecurity: Not on file  Transportation Needs: Not on file  Physical Activity: Not on file  Stress: Not on file  Social Connections: Not on file   Additional Social History:    Allergies:   Allergies  Allergen  Reactions  . Fish Allergy Anaphylaxis  . Shellfish Allergy Anaphylaxis    Labs:  Results for orders placed or performed during the hospital encounter of 12/16/20 (from the past 48 hour(s))  Comprehensive metabolic panel     Status: Abnormal   Collection Time: 12/16/20  8:48 PM  Result Value Ref Range   Sodium 137 135 - 145 mmol/L   Potassium 4.3 3.5 - 5.1 mmol/L   Chloride 104 98 - 111 mmol/L   CO2 22 22 - 32 mmol/L   Glucose, Bld 100 (H) 70 - 99 mg/dL    Comment: Glucose reference range applies only to samples taken after fasting for at least 8 hours.   BUN 18 6 - 20 mg/dL   Creatinine, Ser 1.43 (H) 0.61 - 1.24 mg/dL   Calcium 9.4 8.9 - 10.3 mg/dL   Total Protein 8.4 (H) 6.5 - 8.1 g/dL   Albumin 4.5 3.5 - 5.0 g/dL   AST 28 15 -  41 U/L   ALT 33 0 - 44 U/L   Alkaline Phosphatase 76 38 - 126 U/L   Total Bilirubin 0.5 0.3 - 1.2 mg/dL   GFR, Estimated >60 >60 mL/min    Comment: (NOTE) Calculated using the CKD-EPI Creatinine Equation (2021)    Anion gap 11 5 - 15    Comment: Performed at [facility], [place] [address] [place], [place] [address]  Ethanol     Status: None   Collection Time: 12/16/20  8:48 PM  Result Value Ref Range   Alcohol, Ethyl (B) <10 <10 mg/dL    Comment: (NOTE) Lowest detectable limit for serum alcohol is 10 mg/dL.  For medical purposes only. Performed at [facility], [place] [address], [place], [place] [address]   CBC with Differential     Status: None   Collection Time: 12/16/20  8:48 PM  Result Value Ref Range   WBC 9.6 4.0 - 10.5 K/uL   RBC 4.91 4.22 - 5.81 MIL/uL   Hemoglobin 14.4 13.0 - 17.0 g/dL   HCT 44.3 39.0 - 52.0 %   MCV 90.2 80.0 - 100.0 fL   MCH 29.3 26.0 - 34.0 pg   MCHC 32.5 30.0 - 36.0 g/dL   RDW 14.1 11.5 - 15.5 %   Platelets 290 150 - 400 K/uL   nRBC 0.0 0.0 - 0.2 %   Neutrophils Relative % 75 %   Neutro Abs 7.1 1.7 - 7.7 K/uL   Lymphocytes Relative 15 %   Lymphs Abs 1.4 0.7 - 4.0 K/uL    Monocytes Relative 8 %   Monocytes Absolute 0.8 0.1 - 1.0 K/uL   Eosinophils Relative 2 %   Eosinophils Absolute 0.2 0.0 - 0.5 K/uL   Basophils Relative 0 %   Basophils Absolute 0.0 0.0 - 0.1 K/uL   Immature Granulocytes 0 %   Abs Immature Granulocytes 0.03 0.00 - 0.07 K/uL    Comment: Performed at [facility], [place] [address] [place], [place] [address]  Salicylate level     Status: Abnormal   Collection Time: 12/16/20  8:48 PM  Result Value Ref Range   Salicylate Lvl <0.9 (L) 7.0 - 30.0 mg/dL    Comment: Performed at [facility], [place] [address], [place], [place] [address]  Acetaminophen level     Status: Abnormal   Collection Time: 12/16/20  8:48 PM  Result Value Ref Range   Acetaminophen (Tylenol), Serum <10 (L) 10 - 30 ug/mL    Comment: (NOTE) Therapeutic concentrations vary significantly. A range of 10-30 ug/mL  may be an effective concentration for many patients. However, some  are best treated at concentrations outside of this range. Acetaminophen concentrations >150 ug/mL at 4 hours after ingestion  and >50 ug/mL at 12 hours after ingestion are often associated with  toxic reactions.  Performed at [facility], [place] [address], [place], [place] [address]   Magnesium     Status: None   Collection Time: 12/16/20  8:48 PM  Result Value Ref Range   Magnesium 1.8 1.7 - 2.4 mg/dL    Comment: Performed at [facility], [place] [address] [place], [place] [address]  SARS CORONAVIRUS 2 (TAT 6-24 HRS) Nasopharyngeal Nasopharyngeal Swab     Status: None   Collection Time: 12/16/20  8:52 PM   Specimen: Nasopharyngeal Swab  Result Value Ref Range   SARS Coronavirus 2 NEGATIVE NEGATIVE    Comment: (NOTE) SARS-CoV-2 target nucleic  acids are NOT DETECTED.  The SARS-CoV-2 RNA is generally detectable in upper and lower respiratory specimens during the acute phase of infection. Negative results do not preclude  SARS-CoV-2 infection, do not rule out co-infections with other pathogens, and should not be used as the sole basis for treatment or other patient management decisions. Negative results must be combined with clinical observations, patient history, and epidemiological information. The expected result is Negative.  Fact Sheet for Patients: SugarRoll.be  Fact Sheet for Healthcare Providers: https://www.woods-mathews.com/  This test is not yet approved or cleared by the [place] FDA and  has been authorized for detection and/or diagnosis of SARS-CoV-2 by FDA under an Emergency Use Authorization (EUA). This EUA will remain  in effect (meaning this test can be used) for the duration of the COVID-19 declaration under Se ction 564(b)(1) of the Act, 21 U.S.C. section 360bbb-3(b)(1), unless the authorization is terminated or revoked sooner.  Performed at [facility], [place] [address] [place], [place] [address]   Magnesium     Status: None   Collection Time: 12/17/20  1:24 AM  Result Value Ref Range   Magnesium 1.8 1.7 - 2.4 mg/dL    Comment: Performed at [facility], [place] [address], [place], [place] [address]  Potassium     Status: None   Collection Time: 12/17/20  1:24 AM  Result Value Ref Range   Potassium 4.2 3.5 - 5.1 mmol/L    Comment: Performed at [facility], [place] [address], [place], [place] [address]  Magnesium     Status: None   Collection Time: 12/17/20  4:51 AM  Result Value Ref Range   Magnesium 2.4 1.7 - 2.4 mg/dL    Comment: Performed at [facility], [place] [address], [place], [place] [address]  Potassium     Status: None   Collection Time: 12/17/20  4:51 AM  Result Value Ref Range   Potassium 4.5 3.5 - 5.1 mmol/L    Comment: Performed at [facility], [place] [address], [place], [place] [address]  Urine rapid drug screen (hosp performed)      Status: Abnormal   Collection Time: 12/17/20  7:24 AM  Result Value Ref Range   Opiates NONE DETECTED NONE DETECTED   Cocaine NONE DETECTED NONE DETECTED   Benzodiazepines POSITIVE (A) NONE DETECTED   Amphetamines NONE DETECTED NONE DETECTED   Tetrahydrocannabinol NONE DETECTED NONE DETECTED   Barbiturates NONE DETECTED NONE DETECTED    Comment: (NOTE) DRUG SCREEN FOR MEDICAL PURPOSES ONLY.  IF CONFIRMATION IS NEEDED FOR ANY PURPOSE, NOTIFY LAB WITHIN 5 DAYS.  LOWEST DETECTABLE LIMITS FOR URINE DRUG SCREEN Drug Class                     Cutoff (ng/mL) Amphetamine and metabolites    1000 Barbiturate and metabolites    200 Benzodiazepine                 349 Tricyclics and metabolites     300 Opiates and metabolites        300 Cocaine and metabolites        300 THC                            50 Performed at [facility], [place] [address], [place], [place] [address]   Urinalysis, Routine w reflex microscopic Urine, Clean Catch  Status: Abnormal   Collection Time: 12/17/20  7:24 AM  Result Value Ref Range   Color, Urine YELLOW YELLOW   APPearance CLEAR CLEAR   Specific Gravity, Urine 1.018 1.005 - 1.030   pH 5.0 5.0 - 8.0   Glucose, UA NEGATIVE NEGATIVE mg/dL   Hgb urine dipstick NEGATIVE NEGATIVE   Bilirubin Urine NEGATIVE NEGATIVE   Ketones, ur 5 (A) NEGATIVE mg/dL   Protein, ur NEGATIVE NEGATIVE mg/dL   Nitrite NEGATIVE NEGATIVE   Leukocytes,Ua NEGATIVE NEGATIVE    Comment: Performed at [facility], [place] [address] [place], [place] [address]  Magnesium     Status: Abnormal   Collection Time: 12/17/20  9:18 AM  Result Value Ref Range   Magnesium 2.5 (H) 1.7 - 2.4 mg/dL    Comment: Performed at [facility], [place] [address] [place], [place] [address]  Potassium     Status: None   Collection Time: 12/17/20  9:18 AM  Result Value Ref Range   Potassium 3.8 3.5 - 5.1 mmol/L    Comment: Performed at [facility], [place] [address] [place], [place] [address]  Potassium     Status: None   Collection Time: 12/17/20  1:19 PM  Result Value Ref Range   Potassium 3.6 3.5 - 5.1 mmol/L    Comment: Performed at [facility], [place] [address] [place], [place] [address]  Potassium     Status: None   Collection Time: 12/17/20  6:50 PM  Result Value Ref Range   Potassium 4.4 3.5 - 5.1 mmol/L    Comment: DELTA CHECK NOTED NO VISIBLE HEMOLYSIS REPEATED TO VERIFY Performed at [place] [address] [place], [place] [address]   Renal function panel     Status: Abnormal   Collection Time: 12/18/20  3:36 AM  Result Value Ref Range   Sodium 136 135 - 145 mmol/L   Potassium 3.8 3.5 - 5.1 mmol/L   Chloride 104 98 - 111 mmol/L   CO2 22 22 - 32 mmol/L   Glucose, Bld 90 70 - 99 mg/dL    Comment: Glucose reference range applies only to samples taken after fasting for at least 8 hours.   BUN 15 6 - 20 mg/dL   Creatinine, Ser 1.20 0.61 - 1.24 mg/dL   Calcium 8.8 (L) 8.9 - 10.3 mg/dL   Phosphorus 3.3 2.5 - 4.6 mg/dL   Albumin 3.9 3.5 - 5.0 g/dL   GFR, Estimated >60 >60 mL/min    Comment: (NOTE) Calculated using the CKD-EPI Creatinine Equation (2021)    Anion gap 10 5 - 15    Comment: Performed at [facility], [place] [address] [place], [place] [address]  Magnesium     Status: None   Collection Time: 12/18/20  3:36 AM  Result Value Ref Range   Magnesium 2.2 1.7 - 2.4 mg/dL    Comment: Performed at [facility], [place] [address] [place], [place] [address]  CBC     Status: None   Collection Time: 12/18/20  3:36 AM  Result Value Ref Range   WBC 7.4 4.0 - 10.5 K/uL   RBC 4.52 4.22 - 5.81 MIL/uL   Hemoglobin 13.4 13.0 - 17.0 g/dL   HCT 41.4 39.0 - 52.0 %   MCV 91.6 80.0 - 100.0 fL   MCH 29.6 26.0 - 34.0 pg   MCHC 32.4 30.0 - 36.0 g/dL   RDW 14.2 11.5 - 15.5 %  Platelets 235 150 - 400 K/uL   nRBC 0.0 0.0 - 0.2 %     Comment: Performed at [facility], [place] [address], [place], [place] [address]    Medications:  Current Facility-Administered Medications  Medication Dose Route Frequency Provider Last Rate Last Admin  . enoxaparin (LOVENOX) injection 40 mg  40 mg Subcutaneous Q24H Shela Leff, MD   40 mg at 12/18/20 0856  . folic acid (FOLVITE) tablet 1 mg  1 mg Oral Daily Shela Leff, MD   1 mg at 12/18/20 0857  . LORazepam (ATIVAN) tablet 1-4 mg  1-4 mg Oral Q1H PRN Shela Leff, MD       Or  . LORazepam (ATIVAN) injection 1-4 mg  1-4 mg Intravenous Q1H PRN Shela Leff, MD   2 mg at 12/17/20 1306  . multivitamin with minerals tablet 1 tablet  1 tablet Oral Daily Shela Leff, MD   1 tablet at 12/18/20 0857  . nicotine (NICODERM CQ - dosed in mg/24 hours) patch 14 mg  14 mg Transdermal Daily Shela Leff, MD      . pantoprazole (PROTONIX) EC tablet 40 mg  40 mg Oral BID Shela Leff, MD   40 mg at 12/18/20 0857  . thiamine tablet 100 mg  100 mg Oral Daily Shela Leff, MD   100 mg at 12/18/20 8675   Or  . thiamine (B-1) injection 100 mg  100 mg Intravenous Daily Shela Leff, MD   100 mg at 12/17/20 0932    Musculoskeletal: Strength & Muscle Tone: within normal limits Gait & Station: normal Patient leans: N/A  Psychiatric Specialty Exam: Physical Exam Vitals and nursing note reviewed. Exam conducted with a chaperone present.  Constitutional:      Appearance: Normal appearance.  Pulmonary:     Effort: Pulmonary effort is normal.  Musculoskeletal:        General: Normal range of motion.  Neurological:     Mental Status: He is alert.  Psychiatric:        Attention and Perception: Attention and perception normal.        Mood and Affect: Mood and affect normal.        Speech: Speech normal.        Behavior: Behavior normal. Behavior is cooperative.        Thought Content: Paranoid: Reporting paranoia; but was not  presenting as paranoia. Thought content does not include homicidal or suicidal ideation.        Cognition and Memory: Cognition and memory normal.        Judgment: Judgment is impulsive.     Review of Systems  Psychiatric/Behavioral: Positive for agitation, behavioral problems and suicidal ideas. Negative for decreased concentration, hallucinations, self-injury and sleep disturbance. Nervous/anxious: Stable.   All other systems reviewed and are negative.   Blood pressure 109/70, pulse 81, temperature 97.8 F (36.6 C), temperature source Oral, resp. rate 19, height _0  (1.727 m), weight 76.2 kg, SpO2 98 %.Body mass index is 25.54 kg/m.  General Appearance: Casual  Eye Contact:  None  Speech:  Clear and Coherent and Normal Rate  Volume:  Normal  Mood:  Depressed, Hopeless and Worthless  Affect:  Blunt and Depressed  Thought Process:  Coherent, Linear and Descriptions of Associations: Intact  Orientation:  Full (Time, Place, and Person)  Thought Content:  Logical  Suicidal Thoughts:  Yes.  without intent/plan  Homicidal Thoughts:  No  Memory:  Immediate;   Good Recent;   Good  Judgement:  Intact  Insight:  Present  Psychomotor Activity:  Normal  Concentration:  Concentration: Good and Attention Span: Good  Recall:  Good  Fund of Knowledge:  Good  Language:  Good  Akathisia:  No  Handed:  Right  AIMS (if indicated):     Assets:  Communication Skills Desire for Improvement  ADL's:  Intact  Cognition:  WNL  Sleep:      Treatment Plan Summary: Plan Recommend inpatient at this time due to recent suicide attempt. If patient attempts to leave recommend IVC due to problemts noted above.   Resume home medications, new orders placed for : Neurontin 300 mg Tid Quetiapine 200 mg p.o. nightly  Recommend working closely with SW to facilitate inpatient admission.  Disposition: Recommend psychiatric Inpatient admission when medically cleared. IVC if warranted   Suella Broad, FNP 12/18/2020 10:11 AM

## 2020-12-18 NOTE — Progress Notes (Signed)
PROGRESS NOTE  Brian [name] [id number] DOB: 28-Aug-1986   PCP: Marliss Coots, NP  Patient is from: Home  DOA: 12/16/2020 LOS: 1  Chief complaints: Altered mental status, overdose  Brief Narrative / Interim history: 35 year old M with PMH of polysubstance abuse (EtOH, cocaine, opiates, marijuana, meth, tobacco, BPD, anxiety, depression, PUD and prior intentional OD with Wellbutrin in 07/2020 brought to ED by EMS after he had an overdose with 30 tablets of Wellbutrin XL 300 mg and unknown quantity of gabapentin.  He had audiovisual hallucination, confusion and agitation.  He was admitted for intentional overdose with Wellbutrin and unknown quantity of gabapentin.  Medically cleared by poison control.  Psychiatry recommended inpatient psych.  Subjective: Seen and examined earlier this morning.  No major events overnight of this morning.  Still some visual hallucination but no auditory hallucination.  He says he sees bugs.  He says he was not able to relax.  He says he is disappointed in himself.  Still with suicidal thoughts without plan.  Objective: Vitals:   12/17/20 1946 12/17/20 2337 12/18/20 0334 12/18/20 0745  BP: 120/79 (!) 104/58 104/69 109/70  Pulse: 81 64 63 81  Resp: '18 18 18 19  '$ Temp: 97.8 F (36.6 C) 97.6 F (36.4 C) (!) 97.5 F (36.4 C) 97.8 F (36.6 C)  TempSrc: Oral Oral Oral Oral  SpO2: 97% 99% 98% 98%  Weight:      Height:        Intake/Output Summary (Last 24 hours) at 12/18/2020 1414 Last data filed at 12/17/2020 2100 Gross per 24 hour  Intake 440 ml  Output -  Net 440 ml   Filed Weights   12/16/20 2045  Weight: 76.2 kg    Examination:  GENERAL: No apparent distress.  Nontoxic. HEENT: MMM.  Vision and hearing grossly intact.  NECK: Supple.  No apparent JVD.  RESP:  No IWOB.  Fair aeration bilaterally. CVS:  RRR. Heart sounds normal.  ABD/GI/GU: BS+. Abd soft, NTND.  MSK/EXT:  Moves extremities. No apparent deformity. No edema.  SKIN:  no apparent skin lesion or wound NEURO: Awake, alert and oriented appropriately.  No apparent focal neuro deficit. PSYCH: Calm.  Reports visual hallucination and suicidal thought  Procedures:  None  Microbiology summarized: COVID-19 PCR nonreactive.  Assessment & Plan: Intentional overdose-reportedly took 30 tablets of Wellbutrin XL 300 mg an unknown quantity of gabapentin.  Prior history of this in 07/2020 when he was admitted to inpatient psych.  -Medically cleared by poison control -Psychiatry resumed home Seroquel and gabapentin are recommended inpatient psych. -Continue safety sitter for suicide precaution -Discontinue telemetry -TOC notified about the need for inpatient psych placement  History of alcohol abuse -Continue CIWA protocol with Ativan, thiamine, multivitamin with folic acid  History of polysubstance use-UDS positive for benzo but received ativan here -TOC consulted  Tobacco use disorder -NicoDerm patch and counseling  Anxiety, depression, bipolar disorder -Per psychiatry.  See recommendation above  PUD -Continue PPI  Body mass index is 25.54 kg/m.         DVT prophylaxis:  enoxaparin (LOVENOX) injection 40 mg Start: 12/17/20 1000  Code Status: Home Family Communication: Patient and/or RN. Available if any question.  Level of care: Telemetry.    Status is: Inpatient  Remains inpatient appropriate because:Unsafe d/c plan   Dispo: The patient is from: Home              Anticipated d/c is to: Inpatient psychiatry  Anticipated d/c date is: 1 day              Patient currently is medically stable to d/c.   Difficult to place patient No           Consultants:  Psychiatry Poison control-signed off   Sch Meds:  Scheduled Meds: . enoxaparin (LOVENOX) injection  40 mg Subcutaneous Q24H  . folic acid  1 mg Oral Daily  . gabapentin  300 mg Oral TID  . multivitamin with minerals  1 tablet Oral Daily  . nicotine  14 mg  Transdermal Daily  . pantoprazole  40 mg Oral BID  . QUEtiapine  200 mg Oral QHS  . thiamine  100 mg Oral Daily   Or  . thiamine  100 mg Intravenous Daily   Continuous Infusions: PRN Meds:.LORazepam **OR** LORazepam  Antimicrobials: Anti-infectives (From admission, onward)   None       I have personally reviewed the following labs and images: CBC: Recent Labs  Lab 12/16/20 2048 12/18/20 0336  WBC 9.6 7.4  NEUTROABS 7.1  --   HGB 14.4 13.4  HCT 44.3 41.4  MCV 90.2 91.6  PLT 290 235   BMP &GFR Recent Labs  Lab 12/16/20 2048 12/17/20 0124 12/17/20 0451 12/17/20 0918 12/17/20 1319 12/17/20 1850 12/18/20 0336  NA 137  --   --   --   --   --  136  K 4.3 4.2 4.5 3.8 3.6 4.4 3.8  CL 104  --   --   --   --   --  104  CO2 22  --   --   --   --   --  22  GLUCOSE 100*  --   --   --   --   --  90  BUN 18  --   --   --   --   --  15  CREATININE 1.43*  --   --   --   --   --  1.20  CALCIUM 9.4  --   --   --   --   --  8.8*  MG 1.8 1.8 2.4 2.5*  --   --  2.2  PHOS  --   --   --   --   --   --  3.3   Estimated Creatinine Clearance: 82.3 mL/min (by C-G formula based on SCr of 1.2 mg/dL). Liver & Pancreas: Recent Labs  Lab 12/16/20 2048 12/18/20 0336  AST 28  --   ALT 33  --   ALKPHOS 76  --   BILITOT 0.5  --   PROT 8.4*  --   ALBUMIN 4.5 3.9   No results for input(s): LIPASE, AMYLASE in the last 168 hours. No results for input(s): AMMONIA in the last 168 hours. Diabetic: No results for input(s): HGBA1C in the last 72 hours. No results for input(s): GLUCAP in the last 168 hours. Cardiac Enzymes: No results for input(s): CKTOTAL, CKMB, CKMBINDEX, TROPONINI in the last 168 hours. No results for input(s): PROBNP in the last 8760 hours. Coagulation Profile: No results for input(s): INR, PROTIME in the last 168 hours. Thyroid Function Tests: No results for input(s): TSH, T4TOTAL, FREET4, T3FREE, THYROIDAB in the last 72 hours. Lipid Profile: No results for  input(s): CHOL, HDL, LDLCALC, TRIG, CHOLHDL, LDLDIRECT in the last 72 hours. Anemia Panel: No results for input(s): VITAMINB12, FOLATE, FERRITIN, TIBC, IRON, RETICCTPCT in the last 72 hours. Urine analysis:  Component Value Date/Time   COLORURINE YELLOW 12/17/2020 0724   APPEARANCEUR CLEAR 12/17/2020 0724   LABSPEC 1.018 12/17/2020 0724   PHURINE 5.0 12/17/2020 0724   GLUCOSEU NEGATIVE 12/17/2020 0724   HGBUR NEGATIVE 12/17/2020 0724   BILIRUBINUR NEGATIVE 12/17/2020 0724   KETONESUR 5 (A) 12/17/2020 0724   PROTEINUR NEGATIVE 12/17/2020 0724   UROBILINOGEN 0.2 05/21/2015 0116   NITRITE NEGATIVE 12/17/2020 0724   LEUKOCYTESUR NEGATIVE 12/17/2020 0724   Sepsis Labs: Invalid input(s): PROCALCITONIN, [place]  Microbiology: Recent Results (from the past 240 hour(s))  SARS CORONAVIRUS 2 (TAT 6-24 HRS) Nasopharyngeal Nasopharyngeal Swab     Status: None   Collection Time: 12/16/20  8:52 PM   Specimen: Nasopharyngeal Swab  Result Value Ref Range Status   SARS Coronavirus 2 NEGATIVE NEGATIVE Final    Comment: (NOTE) SARS-CoV-2 target nucleic acids are NOT DETECTED.  The SARS-CoV-2 RNA is generally detectable in upper and lower respiratory specimens during the acute phase of infection. Negative results do not preclude SARS-CoV-2 infection, do not rule out co-infections with other pathogens, and should not be used as the sole basis for treatment or other patient management decisions. Negative results must be combined with clinical observations, patient history, and epidemiological information. The expected result is Negative.  Fact Sheet for Patients: SugarRoll.be  Fact Sheet for Healthcare Providers: https://www.woods-mathews.com/  This test is not yet approved or cleared by the [place] FDA and  has been authorized for detection and/or diagnosis of SARS-CoV-2 by FDA under an Emergency Use Authorization (EUA). This EUA will  remain  in effect (meaning this test can be used) for the duration of the COVID-19 declaration under Se ction 564(b)(1) of the Act, 21 U.S.C. section 360bbb-3(b)(1), unless the authorization is terminated or revoked sooner.  Performed at [facility], [place] [address], [place], [place] [address]     Radiology Studies: No results found.   Zilah Villaflor T. [place]  If 7PM-7AM, please contact night-coverage [email or web address] 12/18/2020, 2:14 PM

## 2020-12-19 DIAGNOSIS — T43292A Poisoning by other antidepressants, intentional self-harm, initial encounter: Principal | ICD-10-CM

## 2020-12-19 NOTE — Progress Notes (Signed)
PROGRESS NOTE   Brian [name]  [id number] DOB: 1986/08/06 DOA: 12/16/2020 PCP: Marliss Coots, NP  Brief Narrative:   35 year old white male living at [place] (cocaine EtOH heroin methamphetamine TOB), bipolar 2-prior SI attempt 08/13/2020 Prior NSTEMI 06/21/2020-?  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-heart cath 06/21/2020 no significant coronary disease Unverified Subutex use-see prior note 08/14/2020 Prior aspiration pneumonia treated at [facility] health 07/15/2020 -Discharged at that time to [facility] substance abuse  Admit 2/21 altered mental status-erratic behavior according to roommate Allegedly ingested 30 tablets Wellbutrin XL 300, possible gabapentin additionally--agitated and hallucinating in ED-Rx with Ativan Eventually medically cleared IVCD after psychiatry saw the patient   Assessment & Plan:   Intentional bupropion abuse Patient with documented attempt at suicide IVC recommended by psychiatry Declines to go to [facility] for rehabilitation willing to trial Zadie Rhine or old Vertis Kelch if possible Agitated and violent behavior See progress note 2/22 Dr. Cyndia Skeeters Renew restraint orders and continue Geodon every 12 as needed agitation, Seroquel 200 at bedtime Further input from psychiatrist appreciated History of EtOH CIWA down from 11-5-previously 30-keep Ativan on board for now Continue gabapentin 300 3 times daily Outpatient rehabilitation Polysubstance abuse As above-unclear if willing to quit Not a safe candidate for Subutex given unreliable follow-up Continue nicotine patch AKI on admission, mild hypocalcemia Saline locked previously-periodic labs-improved from admission Prior NSTEMI 06/21/2020 2/2 methamphetamine abuse Not on aspirin or goal-directed therapy although no coronary disease on last cath 06/15/2020 Reevaluate as an outpatient with cardiology PUD 2015 from hematemesis Regular diet, continue Protonix 40 twice  daily  DVT prophylaxis: Lovenox Code Status: Full Family Communication: None Disposition:  Status is: Inpatient  Remains inpatient appropriate because:Persistent severe electrolyte disturbances, Altered mental status, Ongoing diagnostic testing needed not appropriate for outpatient work up and Unsafe d/c plan   Dispo: The patient is from: Group home              Anticipated d/c is to: Group home              Anticipated d/c date is: 3 days              Patient currently is not medically stable to d/c.   Difficult to place patient No  Consultants:   Psychiatry  Procedures: None  Antimicrobials: None   Subjective: Database reviewed-yesterday evening events elicited from nursing staff Patient calm cooperative out of restraints at this time Specifically states does not wish to go to certain area drug rehabilitation centers No chest pain no fever no chills no nausea    Objective: Vitals:   12/18/20 0745 12/18/20 1629 12/18/20 2058 12/19/20 0632  BP: 109/70 117/71 108/72 (!) 98/59  Pulse: 81 80 81 (!) 56  Resp: '19 18 18 15  '$ Temp: 97.8 F (36.6 C) (!) 97.5 F (36.4 C) 98.7 F (37.1 C) 97.6 F (36.4 C)  TempSrc: Oral Oral Axillary Oral  SpO2: 98% 98% 96% 99%  Weight:      Height:        Intake/Output Summary (Last 24 hours) at 12/19/2020 0910 Last data filed at 12/19/2020 [id number] Gross per 24 hour  Intake 0 ml  Output 1 ml  Net -1 ml   Filed Weights   12/16/20 2045  Weight: 76.2 kg    Examination: EOMI, cooperative not wearing arm or leg restraints No icterus no pallor S1-S2 no murmur Abdomen soft no rebound no guarding Neurologically intact moving 4 limbs without motor deficit sensory is grossly  normal Psychiatry-slightly sleepy-however congruent   Data Reviewed: personally reviewed   CBC    Component Value Date/Time   WBC 7.4 12/18/2020 0336   RBC 4.52 12/18/2020 0336   HGB 13.4 12/18/2020 0336   HCT 41.4 12/18/2020 0336   PLT 235 12/18/2020 0336    MCV 91.6 12/18/2020 0336   MCH 29.6 12/18/2020 0336   MCHC 32.4 12/18/2020 0336   RDW 14.2 12/18/2020 0336   LYMPHSABS 1.4 12/16/2020 2048   MONOABS 0.8 12/16/2020 2048   EOSABS 0.2 12/16/2020 2048   BASOSABS 0.0 12/16/2020 2048   CMP Latest Ref Rng & Units 12/18/2020 12/17/2020 12/17/2020  Glucose 70 - 99 mg/dL 90 - -  BUN 6 - 20 mg/dL 15 - -  Creatinine 0.61 - 1.24 mg/dL 1.20 - -  Sodium 135 - 145 mmol/L 136 - -  Potassium 3.5 - 5.1 mmol/L 3.8 4.4 3.6  Chloride 98 - 111 mmol/L 104 - -  CO2 22 - 32 mmol/L 22 - -  Calcium 8.9 - 10.3 mg/dL 8.8(L) - -  Total Protein 6.5 - 8.1 g/dL - - -  Total Bilirubin 0.3 - 1.2 mg/dL - - -  Alkaline Phos 38 - 126 U/L - - -  AST 15 - 41 U/L - - -  ALT 0 - 44 U/L - - -     Radiology Studies: No results found.   Scheduled Meds: . enoxaparin (LOVENOX) injection  40 mg Subcutaneous Q24H  . folic acid  1 mg Oral Daily  . gabapentin  300 mg Oral TID  . multivitamin with minerals  1 tablet Oral Daily  . nicotine  14 mg Transdermal Daily  . pantoprazole  40 mg Oral BID  . QUEtiapine  200 mg Oral QHS  . thiamine  100 mg Oral Daily   Or  . thiamine  100 mg Intravenous Daily   Continuous Infusions:   LOS: 2 days   Time spent: [place], MD [facility] To contact the attending provider between 7A-7P or the covering provider during after hours 7P-7A, please log into the web site [email or web address] and access using universal [place] password for that web site. If you do not have the password, please call the hospital operator.  12/19/2020, 9:10 AM

## 2020-12-19 NOTE — Consult Note (Signed)
  Patient continues to meet inpatient criteria. Patient is very apologetic for his actions during yesterday. He is able to verbalize his need for inpatient psychiatric admission, and which is something he agreed to for crisis stabilization and medication management. He is aware that he has been placed under IVC at this time due to him threatening to leave AMA. Discussed with patient decreasing in bed availability, and asked for his patients until placement is found. He has agreed to continue current medications to include gabapentin 300 mg p.o. 3 times daily, quetiapine 200 mg p.o. nightly.  -May continue Geodon 20 mg IM every 12 as needed for agitation. -Patient continues to meet inpatient criteria at this time. -Recommend working closely with social work to facilitate inpatient admission referral, if no appropriate beds are available at [facility] or [place] [facility] consider referral to out of system facilities.

## 2020-12-20 LAB — RESP PANEL BY RT-PCR (RSV, FLU A&B, COVID)  RVPGX2
Influenza A by PCR: NEGATIVE
Influenza B by PCR: NEGATIVE
Resp Syncytial Virus by PCR: NEGATIVE
SARS Coronavirus 2 by RT PCR: NEGATIVE

## 2020-12-20 MED ORDER — DIAZEPAM 5 MG PO TABS
5.0000 mg | ORAL_TABLET | Freq: Two times a day (BID) | ORAL | Status: DC | PRN
  Administered 2020-12-20: 5 mg via ORAL
  Filled 2020-12-20: qty 1

## 2020-12-20 MED ORDER — DIAZEPAM 5 MG PO TABS
5.0000 mg | ORAL_TABLET | Freq: Four times a day (QID) | ORAL | Status: DC | PRN
  Administered 2020-12-20 – 2020-12-21 (×3): 5 mg via ORAL
  Filled 2020-12-20 (×3): qty 1

## 2020-12-20 NOTE — Progress Notes (Signed)
PROGRESS NOTE   Brian [name]  [id number] DOB: April 05, 1986 DOA: 12/16/2020 PCP: Marliss Coots, NP  Brief Narrative:   34 year old white male living at [place] (cocaine EtOH heroin methamphetamine TOB), bipolar 2-prior SI attempt 08/13/2020 Prior NSTEMI 06/21/2020-?  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-heart cath 06/21/2020 no significant coronary disease Unverified Subutex use-see prior note 08/14/2020 Prior aspiration pneumonia treated at [facility] health 07/15/2020 -Discharged at that time to [facility] substance abuse  Admit 2/21 altered mental status-erratic behavior according to roommate Allegedly ingested 30 tablets Wellbutrin XL 300, possible gabapentin additionally--agitated and hallucinating in ED-Rx with Ativan Eventually medically cleared IVCD after psychiatry saw the patient  Patient medically cleared awaiting inpatient psychiatric bed   Assessment & Plan:   Intentional bupropion abuse Patient with documented attempt at suicide IVC recommended by psychiatry Medically clear for discharge at this time Agitated and violent behavior See progress note 2/22 Dr. Cyndia Skeeters Has not needed restraints and behavior is improved  continue Geodon every 12 as needed agitation, Seroquel 200 at bedtime Added diazepam short-term every 12 as needed 5 mg for anxiety Further input from psychiatrist appreciated History of EtOH CIWA only 5 Ativan discontinued 2/23 Continue gabapentin 300 3 times daily Outpatient rehabilitation Polysubstance abuse As above-unclear if willing to quit Not a safe candidate for Subutex given unreliable follow-up Continue nicotine patch AKI on admission, mild hypocalcemia Saline locked previously-periodic labs-improved from admission Prior NSTEMI 06/21/2020 2/2 methamphetamine abuse Not on aspirin or goal-directed therapy although no coronary disease on last cath 06/15/2020 Reevaluate as an outpatient with cardiology PUD  2015 from hematemesis Regular diet, continue Protonix 40 twice daily  DVT prophylaxis: Lovenox Code Status: Full Family Communication: None Disposition:  Status is: Inpatient  Remains inpatient appropriate-awaiting psychiatric bed   Dispo: The patient is from: Group home              Anticipated d/c is to: Group home              Anticipated d/c date is: Patient is medically stable for discharge awaiting placement at this time              Patient currently is not medically stable to d/c.   Difficult to place patient Yes  Consultants:   Psychiatry  Procedures: None  Antimicrobials: None   Subjective: Coherent no distress Doing well no pain no fever Asking for Ativan Not in restraints and calm and cooperative today    Objective: Vitals:   12/18/20 2058 12/19/20 0632 12/19/20 2009 12/20/20 0642  BP: 108/72 (!) 98/59 (!) 143/94 102/65  Pulse: 81 (!) 56 83 61  Resp: '18 15 19 17  '$ Temp: 98.7 F (37.1 C) 97.6 F (36.4 C) 98.2 F (36.8 C) 98.4 F (36.9 C)  TempSrc: Axillary Oral Oral   SpO2: 96% 99% 98% 99%  Weight:      Height:        Intake/Output Summary (Last 24 hours) at 12/20/2020 0959 Last data filed at 12/20/2020 0211 Gross per 24 hour  Intake 2275 ml  Output 2 ml  Net 2273 ml   Filed Weights   12/16/20 2045  Weight: 76.2 kg    Examination:  Awake coherent euthymic congruent no distress EOMI NCAT no focal deficit Chest clear no added sound  Data Reviewed: personally reviewed   CBC    Component Value Date/Time   WBC 7.4 12/18/2020 0336   RBC 4.52 12/18/2020 0336   HGB 13.4 12/18/2020 0336  HCT 41.4 12/18/2020 0336   PLT 235 12/18/2020 0336   MCV 91.6 12/18/2020 0336   MCH 29.6 12/18/2020 0336   MCHC 32.4 12/18/2020 0336   RDW 14.2 12/18/2020 0336   LYMPHSABS 1.4 12/16/2020 2048   MONOABS 0.8 12/16/2020 2048   EOSABS 0.2 12/16/2020 2048   BASOSABS 0.0 12/16/2020 2048   CMP Latest Ref Rng & Units 12/18/2020 12/17/2020 12/17/2020   Glucose 70 - 99 mg/dL 90 - -  BUN 6 - 20 mg/dL 15 - -  Creatinine 0.61 - 1.24 mg/dL 1.20 - -  Sodium 135 - 145 mmol/L 136 - -  Potassium 3.5 - 5.1 mmol/L 3.8 4.4 3.6  Chloride 98 - 111 mmol/L 104 - -  CO2 22 - 32 mmol/L 22 - -  Calcium 8.9 - 10.3 mg/dL 8.8(L) - -  Total Protein 6.5 - 8.1 g/dL - - -  Total Bilirubin 0.3 - 1.2 mg/dL - - -  Alkaline Phos 38 - 126 U/L - - -  AST 15 - 41 U/L - - -  ALT 0 - 44 U/L - - -     Radiology Studies: No results found.   Scheduled Meds: . enoxaparin (LOVENOX) injection  40 mg Subcutaneous Q24H  . gabapentin  300 mg Oral TID  . nicotine  14 mg Transdermal Daily  . pantoprazole  40 mg Oral BID  . QUEtiapine  200 mg Oral QHS   Continuous Infusions:   LOS: 3 days   Time spent: [place], MD [facility] To contact the attending provider between 7A-7P or the covering provider during after hours 7P-7A, please log into the web site [email or web address] and access using universal [place] password for that web site. If you do not have the password, please call the hospital operator.  12/20/2020, 9:59 AM

## 2020-12-20 NOTE — TOC Transition Note (Signed)
Transition of Care [facility]) - CM/SW Discharge Note   Patient Details  Name: Brian [name] MRN: [id number] Date of Birth: 02/13/1986  Transition of Care [facility]) CM/SW Contact:  Trish Mage, LCSW Phone Number: 12/20/2020, 3:57 PM   Clinical Narrative:  Patient who is stable for discharge has bed offer from [facility] pending negative COVID and influenza results.  Orders seen and appreciated.  Charge RN Jarrett Soho was added to secure chat with [facility] personnel so that they can be updated with results.  If green light is given for transfer today, please call [facility] dispatch at [phone]. TOC will continue to follow during the course of hospitalization.      Final next level of care: Psychiatric Hospital Barriers to Discharge: Barriers Resolved   Patient Goals and CMS Choice        Discharge Placement                       Discharge Plan and Services                                     Social Determinants of Health (SDOH) Interventions     Readmission Risk Interventions No flowsheet data found.

## 2020-12-21 ENCOUNTER — Other Ambulatory Visit: Payer: Self-pay | Admitting: Psychiatry

## 2020-12-21 ENCOUNTER — Other Ambulatory Visit (HOSPITAL_COMMUNITY): Payer: Self-pay | Admitting: Family Medicine

## 2020-12-21 ENCOUNTER — Encounter (HOSPITAL_COMMUNITY): Payer: Self-pay | Admitting: Psychiatry

## 2020-12-21 ENCOUNTER — Inpatient Hospital Stay (HOSPITAL_COMMUNITY)
Admission: AD | Admit: 2020-12-21 | Discharge: 2020-12-24 | DRG: 897 | Disposition: A | Discharge status: Home / Self Care | Payer: 59 | Source: Intra-hospital | Attending: Psychiatry | Admitting: Psychiatry

## 2020-12-21 ENCOUNTER — Other Ambulatory Visit: Payer: Self-pay

## 2020-12-21 DIAGNOSIS — F1914 Other psychoactive substance abuse with psychoactive substance-induced mood disorder: Principal | ICD-10-CM | POA: Diagnosis present

## 2020-12-21 DIAGNOSIS — G4700 Insomnia, unspecified: Secondary | ICD-10-CM | POA: Diagnosis present

## 2020-12-21 DIAGNOSIS — K219 Gastro-esophageal reflux disease without esophagitis: Secondary | ICD-10-CM | POA: Diagnosis present

## 2020-12-21 DIAGNOSIS — Z91013 Allergy to seafood: Secondary | ICD-10-CM

## 2020-12-21 DIAGNOSIS — F1010 Alcohol abuse, uncomplicated: Secondary | ICD-10-CM | POA: Diagnosis present

## 2020-12-21 DIAGNOSIS — F3181 Bipolar II disorder: Secondary | ICD-10-CM | POA: Diagnosis present

## 2020-12-21 DIAGNOSIS — I252 Old myocardial infarction: Secondary | ICD-10-CM | POA: Diagnosis not present

## 2020-12-21 DIAGNOSIS — Z818 Family history of other mental and behavioral disorders: Secondary | ICD-10-CM | POA: Diagnosis not present

## 2020-12-21 DIAGNOSIS — F17210 Nicotine dependence, cigarettes, uncomplicated: Secondary | ICD-10-CM | POA: Diagnosis present

## 2020-12-21 DIAGNOSIS — F1994 Other psychoactive substance use, unspecified with psychoactive substance-induced mood disorder: Secondary | ICD-10-CM | POA: Diagnosis present

## 2020-12-21 DIAGNOSIS — K279 Peptic ulcer, site unspecified, unspecified as acute or chronic, without hemorrhage or perforation: Secondary | ICD-10-CM | POA: Diagnosis present

## 2020-12-21 DIAGNOSIS — F319 Bipolar disorder, unspecified: Secondary | ICD-10-CM | POA: Diagnosis present

## 2020-12-21 DIAGNOSIS — Z79899 Other long term (current) drug therapy: Secondary | ICD-10-CM

## 2020-12-21 DIAGNOSIS — Z87442 Personal history of urinary calculi: Secondary | ICD-10-CM

## 2020-12-21 DIAGNOSIS — F419 Anxiety disorder, unspecified: Secondary | ICD-10-CM | POA: Diagnosis present

## 2020-12-21 DIAGNOSIS — Z20822 Contact with and (suspected) exposure to covid-19: Secondary | ICD-10-CM | POA: Diagnosis present

## 2020-12-21 HISTORY — DX: Acute myocardial infarction, unspecified: I21.9

## 2020-12-21 LAB — CBC WITH DIFFERENTIAL/PLATELET
Abs Immature Granulocytes: 0.03 10*3/uL (ref 0.00–0.07)
Basophils Absolute: 0 10*3/uL (ref 0.0–0.1)
Basophils Relative: 0 %
Eosinophils Absolute: 0.4 10*3/uL (ref 0.0–0.5)
Eosinophils Relative: 4 %
HCT: 43.2 % (ref 39.0–52.0)
Hemoglobin: 14 g/dL (ref 13.0–17.0)
Immature Granulocytes: 0 %
Lymphocytes Relative: 33 %
Lymphs Abs: 2.9 10*3/uL (ref 0.7–4.0)
MCH: 29.4 pg (ref 26.0–34.0)
MCHC: 32.4 g/dL (ref 30.0–36.0)
MCV: 90.8 fL (ref 80.0–100.0)
Monocytes Absolute: 0.9 10*3/uL (ref 0.1–1.0)
Monocytes Relative: 10 %
Neutro Abs: 4.7 10*3/uL (ref 1.7–7.7)
Neutrophils Relative %: 53 %
Platelets: 263 10*3/uL (ref 150–400)
RBC: 4.76 MIL/uL (ref 4.22–5.81)
RDW: 13.6 % (ref 11.5–15.5)
WBC: 8.9 10*3/uL (ref 4.0–10.5)
nRBC: 0 % (ref 0.0–0.2)

## 2020-12-21 LAB — BASIC METABOLIC PANEL
Anion gap: 7 (ref 5–15)
BUN: 18 mg/dL (ref 6–20)
CO2: 27 mmol/L (ref 22–32)
Calcium: 8.8 mg/dL — ABNORMAL LOW (ref 8.9–10.3)
Chloride: 103 mmol/L (ref 98–111)
Creatinine, Ser: 1.2 mg/dL (ref 0.61–1.24)
GFR, Estimated: >60 mL/min (ref >60)
Glucose, Bld: 102 mg/dL — ABNORMAL HIGH (ref 70–99)
Potassium: 4.1 mmol/L (ref 3.5–5.1)
Sodium: 137 mmol/L (ref 135–145)

## 2020-12-21 LAB — LIPID PANEL
Cholesterol: 133 mg/dL (ref 0–200)
HDL: 45 mg/dL (ref >40)
LDL Cholesterol: 72 mg/dL (ref 0–99)
Total CHOL/HDL Ratio: 3 RATIO
Triglycerides: 81 mg/dL (ref <150)
VLDL: 16 mg/dL (ref 0–40)

## 2020-12-21 LAB — TSH: TSH: 1.372 u[IU]/mL (ref 0.350–4.500)

## 2020-12-21 MED ORDER — PANTOPRAZOLE SODIUM 40 MG PO TBEC
40.0000 mg | DELAYED_RELEASE_TABLET | Freq: Two times a day (BID) | ORAL | Status: DC
  Administered 2020-12-21 – 2020-12-24 (×6): 40 mg via ORAL
  Filled 2020-12-21 (×4): qty 1
  Filled 2020-12-21: qty 14
  Filled 2020-12-21 (×2): qty 1
  Filled 2020-12-21: qty 14
  Filled 2020-12-21 (×3): qty 1

## 2020-12-21 MED ORDER — QUETIAPINE FUMARATE 200 MG PO TABS
200.0000 mg | ORAL_TABLET | Freq: Every day | ORAL | Status: DC
  Administered 2020-12-21 – 2020-12-23 (×3): 200 mg via ORAL
  Filled 2020-12-21: qty 7
  Filled 2020-12-21 (×4): qty 1

## 2020-12-21 MED ORDER — ZIPRASIDONE MESYLATE 20 MG IM SOLR: 20.0000 mg | Freq: Four times a day (QID) | INTRAMUSCULAR | Status: DC | PRN

## 2020-12-21 MED ORDER — MAGNESIUM HYDROXIDE 400 MG/5ML PO SUSP: 30.0000 mL | Freq: Every day | ORAL | Status: DC | PRN

## 2020-12-21 MED ORDER — DIAZEPAM 2 MG PO TABS
2.0000 mg | ORAL_TABLET | Freq: Three times a day (TID) | ORAL | Status: DC | PRN
  Administered 2020-12-21: 2 mg via ORAL
  Filled 2020-12-21: qty 1

## 2020-12-21 MED ORDER — LORAZEPAM 1 MG PO TABS
1.0000 mg | ORAL_TABLET | Freq: Four times a day (QID) | ORAL | Status: DC | PRN
  Administered 2020-12-22 – 2020-12-23 (×2): 1 mg via ORAL
  Filled 2020-12-21 (×2): qty 1

## 2020-12-21 MED ORDER — RISPERIDONE 2 MG PO TBDP: 2.0000 mg | ORAL_TABLET | Freq: Three times a day (TID) | ORAL | Status: DC | PRN

## 2020-12-21 MED ORDER — GABAPENTIN 300 MG PO CAPS
300.0000 mg | ORAL_CAPSULE | Freq: Three times a day (TID) | ORAL | Status: DC
  Administered 2020-12-21 – 2020-12-22 (×3): 300 mg via ORAL
  Filled 2020-12-21 (×7): qty 1

## 2020-12-21 MED ORDER — BUPROPION HCL ER (XL) 300 MG PO TB24
300.0000 mg | ORAL_TABLET | Freq: Every day | ORAL | Status: DC
  Administered 2020-12-21 – 2020-12-24 (×4): 300 mg via ORAL
  Filled 2020-12-21 (×2): qty 1
  Filled 2020-12-21: qty 7
  Filled 2020-12-21 (×4): qty 1

## 2020-12-21 MED ORDER — ACETAMINOPHEN 325 MG PO TABS: 650.0000 mg | ORAL_TABLET | Freq: Four times a day (QID) | ORAL | Status: DC | PRN

## 2020-12-21 MED ORDER — DIAZEPAM 5 MG PO TABS: 5.0000 mg | ORAL_TABLET | Freq: Four times a day (QID) | ORAL | 0 refills | Status: DC | PRN

## 2020-12-21 MED ORDER — ALUM & MAG HYDROXIDE-SIMETH 200-200-20 MG/5ML PO SUSP: 30.0000 mL | ORAL | Status: DC | PRN

## 2020-12-21 MED ORDER — NICOTINE 14 MG/24HR TD PT24: 14.0000 mg | MEDICATED_PATCH | Freq: Every day | TRANSDERMAL | 0 refills | Status: DC

## 2020-12-21 MED ORDER — TRAZODONE HCL 50 MG PO TABS
50.0000 mg | ORAL_TABLET | Freq: Every evening | ORAL | Status: DC | PRN
Start: 2020-12-21 — End: 2020-12-24

## 2020-12-21 MED ORDER — HYDROXYZINE HCL 25 MG PO TABS: 25.0000 mg | ORAL_TABLET | Freq: Three times a day (TID) | ORAL | Status: DC | PRN

## 2020-12-21 NOTE — Discharge Summary (Signed)
Physician Discharge Summary  Brian [name] [id number] DOB: 29-May-1986 DOA: 12/16/2020  PCP: Marliss Coots, NP  Admit date: 12/16/2020 Discharge date: 12/21/2020  Time spent: 22 minutes  Recommendations for Outpatient Follow-up:  1. 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-heart cath 06/21/2020 no significant coronary disease Unverified Subutex use-see prior note 08/14/2020 Prior aspiration pneumonia treated at [facility] 07/15/2020 -Discharged at that time to [facility] substance abuse  Admit 2/21 altered mental status-erratic behavior according to roommate Allegedly ingested 30 tablets Wellbutrin XL 300, possible gabapentin additionally--agitated and hallucinating in ED-Rx with Ativan Eventually medically cleared IVCD after psychiatry saw the patient  Patient medically cleared awaiting inpatient psychiatric bed  Hospital Course:  Intentional bupropion abuse Patient with documented attempt at suicide IVC recommended by psychiatry Medically clear for discharge at this time Agitated and violent behavior See progress note 2/22 Dr. Cyndia Skeeters Has not needed restraints / behavior is improved continue Geodon every 12 as needed agitation, Seroquel 200 at bedtime Added diazepam short-term for anxiety Further management  as per inpatient psychiatrist History of EtOH but has not apparently drank in 5 years since 2020? Continue gabapentin 300 3 times daily Outpatient rehabilitation Polysubstance abuse As above-unclear if willing to quit Not a safe candidate for Subutex given unreliable follow-up Continue nicotine patch AKI on admission, mild hypocalcemia Saline locked previously-periodic labs-improved from admission Prior NSTEMI 06/21/2020 2/2 methamphetamine abuse Not on aspirin or goal-directed therapy although no coronary disease on last cath 06/15/2020 Reevaluate as an outpatient with cardiology PUD 2015 from hematemesis Regular diet, continue Protonix 40 twice daily   Consultations:  Psychiatrist  Discharge Exam: Vitals:   12/20/20 2128 12/21/20 0708  BP: 103/83 103/71  Pulse: 91 68  Resp: 18 20  Temp: 98.6 F (37 C) 97.7 F (36.5 C)  SpO2: 98% 98%    Subj on day of d/c   Awake alert coherent no distress Tells me he might have been anxious yesterday evening No chest pain no fever  General Exam on discharge  Awake alert coherent no distress EOMI NCAT no focal deficit S1-S2 no murmur no rub no gallop Abdomen soft nontender no rebound or guarding Lower extremity edema ROM intact  Discharge Instructions   Discharge Instructions    Diet - low sodium heart healthy   Complete by: As directed    Increase activity slowly   Complete by: As directed      Allergies as of 12/21/2020      Reactions   Fish Allergy Anaphylaxis   Shellfish Allergy Anaphylaxis      Medication List    STOP taking these medications   aspirin 81 MG EC tablet   atorvastatin 80 MG tablet Commonly known as: LIPITOR     TAKE these medications   buPROPion 300 MG 24 hr tablet Commonly known as: WELLBUTRIN XL Take 1 tablet (300 mg total) by mouth daily.   diazepam 5 MG tablet  Commonly known as: VALIUM Take 1 tablet (5 mg total) by mouth every 6 (six) hours as needed for anxiety.   gabapentin 300 MG  capsule Commonly known as: NEURONTIN Take 300 mg by mouth 3 (three) times daily.   nicotine 14 mg/24hr patch Commonly known as: NICODERM CQ - dosed in mg/24 hours Place 1 patch (14 mg total) onto the skin daily.   pantoprazole 40 MG tablet Commonly known as: PROTONIX Take 1 tablet (40 mg total) by mouth 2 (two) times daily.   QUEtiapine 200 MG tablet Commonly known as: SEROQUEL Take 1 tablet (200 mg total) by mouth at bedtime.      Allergies  Allergen Reactions  . Fish Allergy Anaphylaxis  . Shellfish Allergy Anaphylaxis      The results of significant diagnostics from this hospitalization (including imaging, microbiology, ancillary and laboratory) are listed below for reference.    Significant Diagnostic Studies: No results found.  Microbiology: Recent Results (from the past 240 hour(s))  SARS CORONAVIRUS 2 (TAT 6-24 HRS) Nasopharyngeal Nasopharyngeal Swab     Status: None   Collection Time: 12/16/20  8:52 PM   Specimen: Nasopharyngeal Swab  Result Value Ref Range Status   SARS Coronavirus 2 NEGATIVE NEGATIVE Final    Comment: (NOTE) SARS-CoV-2 target nucleic acids are NOT DETECTED.  The SARS-CoV-2 RNA is generally detectable in upper and lower respiratory specimens during the acute phase of infection. Negative results do not preclude SARS-CoV-2 infection, do not rule out co-infections with other pathogens, and should not be used as the sole basis for treatment or other patient management decisions. Negative results must be combined with clinical observations, patient history, and epidemiological information. The expected result is Negative.  Fact Sheet for Patients: SugarRoll.be  Fact Sheet for Healthcare Providers: https://www.woods-mathews.com/  This test is not yet approved or cleared by the [place] FDA and  has been authorized for detection and/or diagnosis of SARS-CoV-2 by FDA under an Emergency Use  Authorization (EUA). This EUA will remain  in effect (meaning this test can be used) for the duration of the COVID-19 declaration under Se ction 564(b)(1) of the Act, 21 U.S.C. section 360bbb-3(b)(1), unless the authorization is terminated or revoked sooner.  Performed at [facility], [place] [address], [place], [place] [address]   Resp panel by RT-PCR (RSV, Flu A&B, Covid)     Status: None   Collection Time: 12/20/20  4:00 PM  Result Value Ref Range Status   SARS Coronavirus 2 by RT PCR NEGATIVE NEGATIVE Final    Comment: (NOTE) SARS-CoV-2 target nucleic acids are NOT DETECTED.  The SARS-CoV-2 RNA is generally detectable in upper respiratory specimens during the acute phase of infection. The lowest concentration of SARS-CoV-2 viral copies this assay can detect is 138 copies/mL. A negative result does not preclude SARS-Cov-2 infection and should not be used as the sole basis for treatment or other patient management decisions. A negative result may occur with  improper specimen collection/handling, submission of specimen other than nasopharyngeal swab, presence of viral mutation(s) within the areas targeted by this assay, and inadequate number of viral copies(<138 copies/mL). A negative result must be combined with clinical observations, patient history, and epidemiological information. The expected result is Negative.  Fact Sheet for Patients:  EntrepreneurPulse.com.au  Fact Sheet for Healthcare Providers:  IncredibleEmployment.be  This test is no t yet approved or cleared by the [place] FDA and  has been authorized for detection and/or diagnosis of SARS-CoV-2 by FDA under an Emergency  Use Authorization (EUA). This EUA will remain  in effect (meaning this test can be used) for the duration of the COVID-19 declaration under Section 564(b)(1) of the Act, 21 U.S.C.section 360bbb-3(b)(1), unless the authorization is terminated  or  revoked sooner.       Influenza A by PCR NEGATIVE NEGATIVE Final   Influenza B by PCR NEGATIVE NEGATIVE Final    Comment: (NOTE) The Xpert Xpress SARS-CoV-2/FLU/RSV plus assay is intended as an aid in the diagnosis of influenza from Nasopharyngeal swab specimens and should not be used as a sole basis for treatment. Nasal washings and aspirates are unacceptable for Xpert Xpress SARS-CoV-2/FLU/RSV testing.  Fact Sheet for Patients: EntrepreneurPulse.com.au  Fact Sheet for Healthcare Providers: IncredibleEmployment.be  This test is not yet approved or cleared by the [place] FDA and has been authorized for detection and/or diagnosis of SARS-CoV-2 by FDA under an Emergency Use Authorization (EUA). This EUA will remain in effect (meaning this test can be used) for the duration of the COVID-19 declaration under Section 564(b)(1) of the Act, 21 U.S.C. section 360bbb-3(b)(1), unless the authorization is terminated or revoked.     Resp Syncytial Virus by PCR NEGATIVE NEGATIVE Final    Comment: (NOTE) Fact Sheet for Patients: EntrepreneurPulse.com.au  Fact Sheet for Healthcare Providers: IncredibleEmployment.be  This test is not yet approved or cleared by the [place] FDA and has been authorized for detection and/or diagnosis of SARS-CoV-2 by FDA under an Emergency Use Authorization (EUA). This EUA will remain in effect (meaning this test can be used) for the duration of the COVID-19 declaration under Section 564(b)(1) of the Act, 21 U.S.C. section 360bbb-3(b)(1), unless the authorization is terminated or revoked.  Performed at [facility], [place] [address], [place], [place] [address]      Labs: Basic Metabolic Panel: Recent Labs  Lab 12/16/20 2048 12/17/20 0124 12/17/20 0451 12/17/20 0918 12/17/20 1319 12/17/20 1850 12/18/20 0336 12/21/20 0338  NA 137  --   --   --    --   --  136 137  K 4.3 4.2 4.5 3.8 3.6 4.4 3.8 4.1  CL 104  --   --   --   --   --  104 103  CO2 22  --   --   --   --   --  22 27  GLUCOSE 100*  --   --   --   --   --  90 102*  BUN 18  --   --   --   --   --  15 18  CREATININE 1.43*  --   --   --   --   --  1.20 1.20  CALCIUM 9.4  --   --   --   --   --  8.8* 8.8*  MG 1.8 1.8 2.4 2.5*  --   --  2.2  --   PHOS  --   --   --   --   --   --  3.3  --    Liver Function Tests: Recent Labs  Lab 12/16/20 2048 12/18/20 0336  AST 28  --   ALT 33  --   ALKPHOS 76  --   BILITOT 0.5  --   PROT 8.4*  --   ALBUMIN 4.5 3.9   No results for input(s): LIPASE, AMYLASE in the last 168 hours. No results for input(s): AMMONIA in the last 168 hours. CBC: Recent Labs  Lab 12/16/20 2048  12/18/20 0336 12/21/20 0338  WBC 9.6 7.4 8.9  NEUTROABS 7.1  --  4.7  HGB 14.4 13.4 14.0  HCT 44.3 41.4 43.2  MCV 90.2 91.6 90.8  PLT 290 235 263   Cardiac Enzymes: No results for input(s): CKTOTAL, CKMB, CKMBINDEX, TROPONINI in the last 168 hours. BNP: BNP (last 3 results) No results for input(s): BNP in the last 8760 hours.  ProBNP (last 3 results) No results for input(s): PROBNP in the last 8760 hours.  CBG: No results for input(s): GLUCAP in the last 168 hours.     Signed:  Nita Sells MD   [facility] 12/21/2020, 8:43 AM

## 2020-12-21 NOTE — Progress Notes (Signed)
Adult Psychoeducational Group Note  Date:  12/21/2020 Time:  5:49 PM  Group Topic/Focus:  support systems  Participation Level:  Active  Participation Quality:  Appropriate  Affect:  Appropriate  Cognitive:  Alert  Insight: Appropriate  Engagement in Group:  Engaged  Modes of Intervention:  Discussion  Additional Comments:  Pt attended group and participated in discussion led by RN.  Caren R Crotts 12/21/2020, 5:49 PM

## 2020-12-21 NOTE — Tx Team (Signed)
Initial Treatment Plan 12/21/2020 1:09 PM Brian [name] [id number]    PATIENT STRESSORS: Marital or family conflict Occupational concerns Substance abuse   PATIENT STRENGTHS: Occupational psychologist fund of knowledge Motivation for treatment/growth   PATIENT IDENTIFIED PROBLEMS: Depression  Suicide attempt  Substance abuse    "Medicine back managed to be stable again"  "Feel good about myself again"           DISCHARGE CRITERIA:  Improved stabilization in mood, thinking, and/or behavior Need for constant or close observation no longer present Reduction of life-threatening or endangering symptoms to within safe limits Verbal commitment to aftercare and medication compliance  PRELIMINARY DISCHARGE PLAN: Substance abuse treatment  PATIENT/FAMILY INVOLVEMENT: This treatment plan has been presented to and reviewed with the patient, Brian [name].  The patient and family have been given the opportunity to ask questions and make suggestions.  Windell Moment, RN 12/21/2020, 1:09 PM

## 2020-12-21 NOTE — TOC Transition Note (Signed)
Transition of Care [facility]) - CM/SW Discharge Note   Patient Details  Name: Brian [name] MRN: [id number] Date of Birth: [birth year]  [facility] CM/SW Contact:  Trish Mage, LCSW Phone Number: 12/21/2020, 9:51 AM   Clinical Narrative:  Per Dietrich Pates, RN at [facility], patient can arrive anytime after 1130 and have them call report to [phone].  Transportation arranged through [employer].  TOC sign off.       Final next level of care: Psychiatric Hospital Barriers to Discharge: Barriers Resolved   Patient Goals and CMS Choice        Discharge Placement                       Discharge Plan and Services                                     Social Determinants of Health (SDOH) Interventions     Readmission Risk Interventions No flowsheet data found.

## 2020-12-21 NOTE — [facility] Group Notes (Signed)
Type of Therapy and Topic: Group Therapy: Gratitude  Description of Group: The purpose behind this group is to get people thinking about things for which  they can be grateful. If continued over time, they might begin to spontaneously look for things and  situations for which to be grateful. Gratitude is related to a "wide variety of forms of wellbeing , whereas "negative attributions" can adversely affect relationships.  Several studies have shown that interventions to  increase gratitude can impact areas such as overall life satisfaction, decreased negative affect, increased happiness, the ability to provide emotional support to others, and decreased worrying.   Therapeutic Goals: 1. Patient will learn activities that focus on gratitude in their daily lives. 2. Patient will share gratitude in their daily lives. 3. Patient will learn to develop healthy habits and positive thinking techniques. 4. Patient will receive support and feedback from others  Pt received packet for group, followed along, was attentive and interactive. Pt shared that they are grateful for his son during introductions. Pt shared "I am grateful for who I am because of the demons I have conquered.".  Therapeutic Modalities: Cognitive Behavioral Therapy Solution Focused Therapy Motivational Interviewing

## 2020-12-21 NOTE — Progress Notes (Signed)
Patient will be discharging with [employer] to [facility] soon. Report will be called.

## 2020-12-21 NOTE — Progress Notes (Signed)
D: Patient presents with anxious and sad affect at time of assessment. Patient expresses guilt regarding his relapses and expresses feeling disappointment in himself. Patient denies SI/HI at this time. Patient also denies AH/VH at this time. Patient contracts for safety.  A: Provided positive reinforcement and encouragement.  R: Patient cooperative and receptive to efforts. Patient remains safe on the unit.   12/21/20 2054  Psych Admission Type (Psych Patients Only)  Admission Status Involuntary  Psychosocial Assessment  Patient Complaints Irritability;Sadness;Depression  Eye Contact Fair  Facial Expression Sad  Affect Anxious;Sad  Speech Rapid  Interaction Assertive  Motor Activity Fidgety  Appearance/Hygiene Unremarkable  Behavior Characteristics Cooperative;Appropriate to situation  Mood Guilty;Sad  Thought Process  Coherency WDL  Content Blaming self  Delusions None reported or observed  Perception WDL  Hallucination None reported or observed  Judgment Impaired  Confusion None  Danger to Self  Current suicidal ideation? Denies  Danger to Others  Danger to Others None reported or observed

## 2020-12-21 NOTE — H&P (Signed)
Psychiatric Admission Assessment Adult  Patient Identification: Brian [name]  MRN:  [id number]  Date of Evaluation:  12/22/2020  Chief Complaint: Drug overdose (mixture of Methamphetamine & a whole bottle of gabapentin) rendering patient with altered mental status   Principal Diagnosis: Substance induced mood disorder ([place])  Diagnosis:   Patient Active Problem List   Diagnosis Date Noted  . Substance induced mood disorder ([place]) [F19.94] 03/26/2018    Priority: High  . Opioid use disorder, severe, dependence ([place]) [F11.20] 03/08/2015    Priority: High  . 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 Bipolar disorder ([place]) [F31.9] 12/21/2020  . Alcohol abuse [F10.10] 12/17/2020  . PUD (peptic ulcer disease) [K27.9] 12/17/2020  . Drug overdose [T50.901A] 12/17/2020  . 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 Gastroesophageal reflux disease [K21.9] 10/10/2020  . Bupropion overdose [T43.291A] 08/13/2020  . Substance use disorder [F19.90] 08/13/2020  . Suicidal ideation [R45.851] 08/13/2020  . Pericarditis [I31.9] 07/02/2020  . Depression [F32.A] 07/02/2020  . Elevated troponin [R77.8]   . Non-ST elevation (NSTEMI) myocardial infarction ([employer]) [I21.4]   . Chest pain [R07.9] 06/19/2020  . 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 Malingering [Z76.5] 06/04/2020  . Paranoia ([place]) [F22]   . Bipolar II disorder, severe, depressed, with anxious distress ([place]) [F31.81] 03/25/2018  . Cellulitis [L03.90] 05/20/2015  . Dysuria [R30.0] 05/20/2015  . Smoker [F17.200] 05/20/2015  . Homeless single person [Z59.00] 05/20/2015  . Drug abuse ([place]) [F19.10]   . Ileus of unspecified type ([place]) [K56.7] 05/28/2014  . Abdominal pain [R10.9] 05/28/2014  . AKI (acute kidney injury) ([place]) [N17.9] 05/28/2014  . Esophagitis, acute [K20.90] 05/28/2014  . Polysubstance abuse ([place]) [F19.10] 05/28/2014  .  Hematemesis [K92.0] 05/28/2014   History of Present Illness: This is one of several admission assessments in this [facility] for this 35 year old Caucasian male with hx of polysubstance use disorder & Bipolar disorder. Admitted to the [facility] from the [facility] with complaints of altered mental status triggered by drug overdose on a mixture of Methamphetamine & a bottle of gabapentin capsules. Patient apparently had relapsed after 4 months sobriety. His UDS upon admission at the ED was positive for just Benzodiazepine.  During this assessment, Brian [name] reports, "My memory is very foggy right now because all I have been through. I was at the [facility] this time for 5 days. The truth is I pretty much relapsed on drugs again after 4 months sobriety. I have had 3 years sobriety or clean time overall. However, my 35 younger sister who has never been a drug addict overdosed on Fentanyl last week, she almost did not make it. I thought she had died. She is still in the hospital. This kid looks up to me. I lost it, my emotion was all over the place. That incident triggered it for me. I relapsed. 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 Meth cured me from heroin & became my main drug of choice. So, this time around, I did some methamphetamine, then took too many 300 mg of gabapentin pills, a whole bottle of it in one week. I love gabapentin because it numbs me from feeling stuff. It helps me to forget my problems. It helped me to deal with my sister's issues. 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 He went to jail. 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 They all got frustrated. They all relapsed the same time because nothing was going well. To make matters worse, I learnt that my brother has gone to jail as well. I saw my whole world crumbling in front of  me. All these happened back to back last week.  I continued to use Meth & gabapentin till I woke in the hospital.  I never did overdose on Wellbutrin. I love my Wellbutrin, Seroquel & Remeron. I did not abuse those. I need them. They help me with emotion. I'm doing okay right now, just having a lot of guilt".   Associated Signs/Symptoms:  Depression Symptoms:  depressed mood, feelings of worthlessness/guilt, hopelessness, anxiety,  (Hypo) Manic Symptoms:  Impulsivity, Labiality of Mood,  Anxiety Symptoms:  Excessive Worry,  Psychotic Symptoms:  Denies any hallucinations, delusions or paranoia.  PTSD Symptoms: Denies  Total Time spent with patient: 1 hour  Past Psychiatric History: Bipolar disorder - 2, Opioid use disorder, Amphetamine use disorder, Cocaine use disorder.  Is the patient at risk to self? No.  Has the patient been a risk to self in the past 6 months? No.  Has the patient been a risk to self within the distant past? Yes.    Is the patient a risk to others? No.  Has the patient been a risk to others in the past 6 months? No.  Has the patient been a risk to others within the distant past? No.   Prior Inpatient Therapy: Yes, ([place], [place], [facility], [place], [place])  Prior Outpatient Therapy: Yes.  Alcohol Screening: 1. How often do you have a drink containing alcohol?: Never 2. How many drinks containing alcohol do you have on a typical day when you are drinking?: 1 or 2 3. How often do you have six or more drinks on one occasion?: Never AUDIT-C Score: 0 9. Have you or someone else been injured as a result of your drinking?: No 10. Has a relative or friend or a doctor or another health worker been concerned about your drinking or suggested you cut down?: No Alcohol Use Disorder Identification Test Final Score (AUDIT): 0 Alcohol Brief Interventions/Follow-up: AUDIT Score <7 follow-up not indicated  Substance Abuse History in the last 12 months:  Yes.    Consequences  of Substance Abuse: Medical Consequences:  Liver damage, Possible death by overdose Legal Consequences:  Arrests, jail time, Loss of driving privilege. Family Consequences:  Family discord, divorce and or separation.  Previous Psychotropic Medications: Yes, (Zyprexa, Latuda, Depakote, Sertraline etc).  Psychological Evaluations: Yes   Past Medical History:  Past Medical History:  Diagnosis Date  . Alcohol abuse   . Anxiety   . Bipolar 2 disorder ([place])   . Cocaine abuse ([place])   . Depression   . Kidney stones   . Myocardial infarction ([place])   . Narcotic abuse ([place])   . Peptic ulcer   . Polysubstance abuse ([place])   . Renal disorder    kidney stones    Past Surgical History:  Procedure Laterality Date  . ESOPHAGOGASTRODUODENOSCOPY N/A 05/29/2014   Procedure: ESOPHAGOGASTRODUODENOSCOPY (EGD) ;  Surgeon: Beryle Beams, MD;  Location: [facility] ENDOSCOPY;  Service: Endoscopy;  Laterality: N/A;  check with Dr. Collene Mares about sedation type/timinng - I recommend MAC  . HAND SURGERY  2006  . LEFT HEART CATH AND CORONARY ANGIOGRAPHY N/A 06/21/2020   Procedure: LEFT HEART CATH AND CORONARY ANGIOGRAPHY;  Surgeon:  End, Harrell Gave, MD;  Location: [place] CV LAB;  Service: Cardiovascular;  Laterality: N/A;   Family History:  Family History  Problem Relation Age of Onset  . Heart disease Father    Family Psychiatric  History: Mental illness: Daughter  Tobacco Screening: Have you used any form of tobacco in the last 30 days? (Cigarettes, Smokeless Tobacco, Cigars, and/or Pipes): Yes Tobacco use, Select all that apply: 5 or more cigarettes per day Are you interested in Tobacco Cessation Medications?: No, patient refused Counseled patient on smoking cessation including recognizing danger situations, developing coping skills and basic information about quitting provided: Refused/Declined practical counseling  Social History:  Social History   Substance and Sexual Activity  Alcohol Use Not Currently      Social History   Substance and Sexual Activity  Drug Use Not Currently  . Types: Marijuana, Cocaine, IV, Methamphetamines   Comment: heroin and meth    Additional Social History:    Allergies:   Allergies  Allergen Reactions  . Fish Allergy Anaphylaxis  . Shellfish Allergy Anaphylaxis   Lab Results:  Results for orders placed or performed during the hospital encounter of 12/16/20 (from the past 48 hour(s))  Resp panel by RT-PCR (RSV, Flu A&B, Covid)     Status: None   Collection Time: 12/20/20  4:00 PM  Result Value Ref Range   SARS Coronavirus 2 by RT PCR NEGATIVE NEGATIVE    Comment: (NOTE) SARS-CoV-2 target nucleic acids are NOT DETECTED.  The SARS-CoV-2 RNA is generally detectable in upper respiratory specimens during the acute phase of infection. The lowest concentration of SARS-CoV-2 viral copies this assay can detect is 138 copies/mL. A negative result does not preclude SARS-Cov-2 infection and should not be used as the sole basis for treatment or other patient management decisions. A negative result may occur with  improper specimen collection/handling, submission of specimen other than nasopharyngeal swab, presence of viral mutation(s) within the areas targeted by this assay, and inadequate number of viral copies(<138 copies/mL). A negative result must be combined with clinical observations, patient history, and epidemiological information. The expected result is Negative.  Fact Sheet for Patients:  EntrepreneurPulse.com.au  Fact Sheet for Healthcare Providers:  IncredibleEmployment.be  This test is no t yet approved or cleared by the [place] FDA and  has been authorized for detection and/or diagnosis of SARS-CoV-2 by FDA under an Emergency Use Authorization (EUA). This EUA will remain  in effect (meaning this test can be used) for the duration of the COVID-19 declaration under Section 564(b)(1) of the Act,  21 U.S.C.section 360bbb-3(b)(1), unless the authorization is terminated  or revoked sooner.       Influenza A by PCR NEGATIVE NEGATIVE   Influenza B by PCR NEGATIVE NEGATIVE    Comment: (NOTE) The Xpert Xpress SARS-CoV-2/FLU/RSV plus assay is intended as an aid in the diagnosis of influenza from Nasopharyngeal swab specimens and should not be used as a sole basis for treatment. Nasal washings and aspirates are unacceptable for Xpert Xpress SARS-CoV-2/FLU/RSV testing.  Fact Sheet for Patients: EntrepreneurPulse.com.au  Fact Sheet for Healthcare Providers: IncredibleEmployment.be  This test is not yet approved or cleared by the [place] FDA and has been authorized for detection and/or diagnosis of SARS-CoV-2 by FDA under an Emergency Use Authorization (EUA). This EUA will remain in effect (meaning this test can be used) for the duration of the COVID-19 declaration under Section 564(b)(1) of the Act, 21 U.S.C. section 360bbb-3(b)(1), unless the authorization is terminated or revoked.  Resp Syncytial Virus by PCR NEGATIVE NEGATIVE    Comment: (NOTE) Fact Sheet for Patients: EntrepreneurPulse.com.au  Fact Sheet for Healthcare Providers: IncredibleEmployment.be  This test is not yet approved or cleared by the [place] FDA and has been authorized for detection and/or diagnosis of SARS-CoV-2 by FDA under an Emergency Use Authorization (EUA). This EUA will remain in effect (meaning this test can be used) for the duration of the COVID-19 declaration under Section 564(b)(1) of the Act, 21 U.S.C. section 360bbb-3(b)(1), unless the authorization is terminated or revoked.  Performed at [facility], [place] [address] [place], [place] [address]   CBC with Differential/Platelet     Status: None   Collection Time: 12/21/20  3:38 AM  Result Value Ref Range   WBC 8.9 4.0 - 10.5 K/uL    RBC 4.76 4.22 - 5.81 MIL/uL   Hemoglobin 14.0 13.0 - 17.0 g/dL   HCT 43.2 39.0 - 52.0 %   MCV 90.8 80.0 - 100.0 fL   MCH 29.4 26.0 - 34.0 pg   MCHC 32.4 30.0 - 36.0 g/dL   RDW 13.6 11.5 - 15.5 %   Platelets 263 150 - 400 K/uL   nRBC 0.0 0.0 - 0.2 %   Neutrophils Relative % 53 %   Neutro Abs 4.7 1.7 - 7.7 K/uL   Lymphocytes Relative 33 %   Lymphs Abs 2.9 0.7 - 4.0 K/uL   Monocytes Relative 10 %   Monocytes Absolute 0.9 0.1 - 1.0 K/uL   Eosinophils Relative 4 %   Eosinophils Absolute 0.4 0.0 - 0.5 K/uL   Basophils Relative 0 %   Basophils Absolute 0.0 0.0 - 0.1 K/uL   Immature Granulocytes 0 %   Abs Immature Granulocytes 0.03 0.00 - 0.07 K/uL    Comment: Performed at [facility], [place] [address], [place], [place] [address]  Basic metabolic panel     Status: Abnormal   Collection Time: 12/21/20  3:38 AM  Result Value Ref Range   Sodium 137 135 - 145 mmol/L   Potassium 4.1 3.5 - 5.1 mmol/L   Chloride 103 98 - 111 mmol/L   CO2 27 22 - 32 mmol/L   Glucose, Bld 102 (H) 70 - 99 mg/dL    Comment: Glucose reference range applies only to samples taken after fasting for at least 8 hours.   BUN 18 6 - 20 mg/dL   Creatinine, Ser 1.20 0.61 - 1.24 mg/dL   Calcium 8.8 (L) 8.9 - 10.3 mg/dL   GFR, Estimated >60 >60 mL/min    Comment: (NOTE) Calculated using the CKD-EPI Creatinine Equation (2021)    Anion gap 7 5 - 15    Comment: Performed at [facility], [place] [address], [place], [place] [address]   Blood Alcohol level:  Lab Results  Component Value Date   ETH <10 12/16/2020   ETH <10 91/47/8295   Metabolic Disorder Labs:  Lab Results  Component Value Date   HGBA1C 5.3 06/19/2020   MPG 105 06/19/2020   MPG 108.28 03/28/2018   No results found for: PROLACTIN Lab Results  Component Value Date   CHOL 152 03/28/2018   TRIG 99 03/28/2018   HDL 44 03/28/2018   CHOLHDL 3.5 03/28/2018   VLDL 20 03/28/2018   LDLCALC 88 03/28/2018   Current  Medications: Current Facility-Administered Medications  Medication Dose Route Frequency Provider Last Rate Last Admin  . acetaminophen (TYLENOL) tablet 650 mg  650 mg Oral Q6H PRN Myles Lipps  Kellie Simmering, MD      . alum & mag hydroxide-simeth (MAALOX/MYLANTA) 200-200-20 MG/5ML suspension 30 mL  30 mL Oral Q4H PRN Sharma Covert, MD      . buPROPion (WELLBUTRIN XL) 24 hr tablet 300 mg  300 mg Oral Daily Sharma Covert, MD   300 mg at 12/21/20 1416  . diazepam (VALIUM) tablet 2 mg  2 mg Oral Q8H PRN Sharma Covert, MD      . gabapentin (NEURONTIN) capsule 300 mg  300 mg Oral TID Sharma Covert, MD   300 mg at 12/21/20 1416  . hydrOXYzine (ATARAX/VISTARIL) tablet 25 mg  25 mg Oral TID PRN Sharma Covert, MD      . risperiDONE (RISPERDAL M-TABS) disintegrating tablet 2 mg  2 mg Oral Q8H PRN Sharma Covert, MD       And  . LORazepam (ATIVAN) tablet 1 mg  1 mg Oral Q6H PRN Sharma Covert, MD       And  . ziprasidone (GEODON) injection 20 mg  20 mg Intramuscular Q6H PRN Sharma Covert, MD      . magnesium hydroxide (MILK OF MAGNESIA) suspension 30 mL  30 mL Oral Daily PRN Sharma Covert, MD      . pantoprazole (PROTONIX) EC tablet 40 mg  40 mg Oral BID AC Sharma Covert, MD      . QUEtiapine (SEROQUEL) tablet 200 mg  200 mg Oral QHS Sharma Covert, MD      . traZODone (DESYREL) tablet 50 mg  50 mg Oral QHS PRN Sharma Covert, MD       PTA Medications: Medications Prior to Admission  Medication Sig Dispense Refill Last Dose  . buPROPion (WELLBUTRIN XL) 300 MG 24 hr tablet Take 1 tablet (300 mg total) by mouth daily. 30 tablet 1   . diazepam (VALIUM) 5 MG tablet Take 1 tablet (5 mg total) by mouth every 6 (six) hours as needed for anxiety. 30 tablet 0   . gabapentin (NEURONTIN) 300 MG capsule Take 300 mg by mouth 3 (three) times daily.     . nicotine (NICODERM CQ - DOSED IN MG/24 HOURS) 14 mg/24hr patch Place 1 patch (14 mg total) onto the skin daily. 28  patch 0   . pantoprazole (PROTONIX) 40 MG tablet Take 1 tablet (40 mg total) by mouth 2 (two) times daily. 60 tablet 0   . QUEtiapine (SEROQUEL) 200 MG tablet Take 1 tablet (200 mg total) by mouth at bedtime. 30 tablet 1    Musculoskeletal: Strength & Muscle Tone: within normal limits Gait & Station: normal Patient leans: N/A  Psychiatric Specialty Exam: Physical Exam Vitals and nursing note reviewed.  Constitutional:      Appearance: He is well-developed.  HENT:     Head: Normocephalic.     Nose: Nose normal.     Mouth/Throat:     Pharynx: Oropharynx is clear.  Eyes:     Pupils: Pupils are equal, round, and reactive to light.  Cardiovascular:     Rate and Rhythm: Normal rate.     Pulses: Normal pulses.  Pulmonary:     Effort: Pulmonary effort is normal.  Abdominal:     Palpations: Abdomen is soft.  Genitourinary:    Comments: Deferred Musculoskeletal:        General: Normal range of motion.     Cervical back: Normal range of motion.  Skin:    General: Skin is warm.  Neurological:  General: No focal deficit present.     Mental Status: He is alert.     Review of Systems  Constitutional: Negative.  Negative for chills and fever.  HENT: Negative.  Negative for congestion and sore throat.   Eyes: Negative.  Negative for blurred vision.  Respiratory: Negative.  Negative for cough, shortness of breath and wheezing.   Cardiovascular: Negative.  Negative for chest pain and palpitations.       Elevated pulse rate: 113  Gastrointestinal: Negative for diarrhea, heartburn, nausea and vomiting.  Genitourinary: Negative.  Negative for urgency ( ).  Musculoskeletal: Negative.  Negative for joint pain and myalgias.  Skin: Negative.   Neurological: Negative.  Negative for dizziness, tingling, tremors, sensory change, speech change, focal weakness, seizures, loss of consciousness (Hx of altered mental status of recent.), weakness and headaches.  Endo/Heme/Allergies: Negative.   Negative for environmental allergies (  Fish & Shellfish.). Does not bruise/bleed easily.       Allergies: Fish & Shellfish.  Psychiatric/Behavioral: Positive for depression, hallucinations and substance abuse (UDS (+) for THC. (Hx of Amphetamine, Opioid THC & Cocaine use disorders) ). Negative for memory loss (Hx. recent altered mental status.) and suicidal ideas (Hx of attempts by overdose.). The patient is nervous/anxious and has insomnia.     Blood pressure 132/74, pulse (!) 103, temperature 98.3 F (36.8 C), temperature source Oral, resp. rate 20, height _0  (1.727 m), weight 76.7 kg, SpO2 96 %.Body mass index is 25.7 kg/m.  General Appearance: Casual and Disheveled  Eye Contact:  Fair  Speech:  Clear and Coherent and Pressured  Volume:  Increased  Mood:  Anxious and Depressed  Affect:  Congruent, Flat and Tearful  Thought Process:  Coherent, Goal Directed and Descriptions of Associations: Tangential  Orientation:  Full (Time, Place, and Person)  Thought Content:  Rumination, denies any hallucinations, delusions or paranoia.  Suicidal Thoughts:  Currently denies any thoughts, plans or intent., able to contract for safety.  Homicidal Thoughts:  Denies  Memory:  Immediate;   Fair Recent;   Good Remote;   Good  Judgement:  Fair  Insight:  Fair  Psychomotor Activity:  Increased  Concentration:  Concentration: Fair and Attention Span: Fair  Recall:  [employer] of Knowledge:  Good  Language:  Good  Akathisia:  Negative  Handed:  Right  AIMS (if indicated):     Assets:  Communication Skills Desire for Improvement Physical Health  ADL's:  Intact  Cognition:  WNL  Sleep: New admit.    Treatment Plan/Recommendations: 1. Admit for crisis management and stabilization, estimated length of stay 3-5 days.   2. Medication management to reduce current symptoms to base line and improve the patient's overall level of functioning: See MAR, Md's SRA & treatment plan.   Observation  Level/Precautions:  15 minute checks  Laboratory:  Per ED, UDS (+) Benzodiazepine. Will obtain lipid panel, hgba1c & TSH  Psychotherapy: Group therapy    Medications:  See [facility]  Consultations: As needed.    Discharge Concerns: Safety, mood stability   Estimated LOS: 2-4 days   Other: Admit to the 300-hall.   Physician Treatment Plan for Primary Diagnosis: Substance induced mood disorder ([place])  Long Term Goal(s): Improvement in symptoms so as ready for discharge  Short Term Goals: Ability to identify changes in lifestyle to reduce recurrence of condition will improve, Ability to verbalize feelings will improve, Ability to disclose and discuss suicidal ideas and Ability to demonstrate self-control will improve  Physician  Treatment Plan for Secondary Diagnosis: Principal Problem:   Substance induced mood disorder (H[facility]) Active Problems:   Bipolar disorder ([place])  Long Term Goal(s): Improvement in symptoms so as ready for discharge  Short Term Goals: Ability to identify and develop effective coping behaviors will improve, Compliance with prescribed medications will improve and Ability to identify triggers associated with substance abuse/mental health issues will improve  I certify that inpatient services furnished can reasonably be expected to improve the patient's condition.    Lindell Spar, NP, PMHNP, FNP-BC 2/25/20223:24 PM

## 2020-12-21 NOTE — Progress Notes (Signed)
Brian [name] is a 35 year old male being admitted involuntarily to 307-2 from Med floor at [facility].  He was medically hospitalized for OD on Wellbutrin and possibly Gabapentin.  He has history of substance abuse which he relapsed on Methamphetamines last year after being clear for 3 years.  He currently lives at [place] and isn't sure if he will be able to return.  During [facility] admission, he was pleasant and cooperative.  He denied that the overdose was intentional and he was just trying to get high.  He reported history of Bipolar 2 and was being treated with Wellbutrin which has stopped helping with his moods for the past few months.  He recently lost his job and possibly losing his housing are his stressors.  He did state that he has been in 90 treatment programs/psychiatric hospitalizations in the past.  Oriented him to the unit.  Admission paperwork completed and signed.  Belongings searched and secured in locker # 34, no contraband found.  Skin assessment completed and noted tattoos on both sides of neck and one on each upper arm.  Q 15 minute checks initiated for safety.  We will continue to monitor the progress towards his goals.

## 2020-12-22 LAB — HEMOGLOBIN A1C
Hgb A1c MFr Bld: 5.9 % — ABNORMAL HIGH (ref 4.8–5.6)
Mean Plasma Glucose: 122.63 mg/dL

## 2020-12-22 MED ORDER — GABAPENTIN 100 MG PO CAPS
200.0000 mg | ORAL_CAPSULE | Freq: Three times a day (TID) | ORAL | Status: DC
  Administered 2020-12-22 – 2020-12-23 (×4): 200 mg via ORAL
  Filled 2020-12-22 (×9): qty 2

## 2020-12-22 MED ORDER — DIAZEPAM 2 MG PO TABS
2.0000 mg | ORAL_TABLET | Freq: Two times a day (BID) | ORAL | Status: DC | PRN
  Administered 2020-12-22: 2 mg via ORAL
  Filled 2020-12-22: qty 1

## 2020-12-22 NOTE — [facility] Suicide Risk Assessment (Signed)
[facility] INPATIENT:  Family/Significant Other Suicide Prevention Education  Suicide Prevention Education:  Patient Refusal for Family/Significant Other Suicide Prevention Education: The patient Brian [name] has refused to provide written consent for family/significant other to be provided Family/Significant Other Suicide Prevention Education during admission and/or prior to discharge.  Physician notified.  Durenda Hurt 12/22/2020, 4:21 PM

## 2020-12-22 NOTE — Progress Notes (Signed)
D: Patient presents with irritable and angry affect. Patient reports having a bad day because his dose of Wellbutrin is not correct. Patient states "I am gonna show the fuck out tomorrow if my medication is not fixed, everyone on the unit will know my name." Patient denies SI/HI at this time. Patient also denies AH/VH at this time. Patient contracts for safety.  A: Provided positive reinforcement and encouragement.  R: Patient not cooperative and receptive to efforts. Patient remains safe on the unit.   12/22/20 2109  Psych Admission Type (Psych Patients Only)  Admission Status Involuntary  Psychosocial Assessment  Patient Complaints Irritability  Eye Contact Fair  Facial Expression Angry;Sad  Affect Angry;Sad  Speech Rapid  Interaction Assertive  Motor Activity Fidgety  Appearance/Hygiene Unremarkable  Behavior Characteristics Cooperative;Appropriate to situation  Mood Angry  Thought Process  Coherency WDL  Content WDL  Delusions None reported or observed  Perception WDL  Hallucination None reported or observed  Judgment Impaired  Confusion None  Danger to Self  Current suicidal ideation? Denies  Danger to Others  Danger to Others None reported or observed

## 2020-12-22 NOTE — [facility] Group Notes (Signed)
LCSW Group Therapy Note  12/22/2020 12:02 PM  Type of Therapy and Topic:  Group Therapy:  Feelings around Relapse and Recovery  Participation Level:  Active   Description of Group:    Patients in this group will discuss emotions they experience before and after a relapse. They will process how experiencing these feelings, or avoidance of experiencing them, relates to having a relapse. Facilitator will guide patients to explore emotions they have related to recovery. Patients will be encouraged to process which emotions are more powerful. They will be guided to discuss the emotional reaction significant others in their lives may have to their relapse or recovery. Patients will be assisted in exploring ways to respond to the emotions of others without this contributing to a relapse.  Therapeutic Goals: 1. Patient will identify two or more emotions that lead to a relapse for them 2. Patient will identify two emotions that result when they relapse 3. Patient will identify two emotions related to recovery 4. Patient will demonstrate ability to communicate their needs through discussion and/or role plays   Summary of Patient Progress: Patient was present for the entirety of the group session. Patient was an active listener and participated in the topic of discussion, provided helpful advice to others, and added nuance to topic of conversation. Patient shared multiple examples from his own life how addiction can impact someone's life.    Therapeutic Modalities:   Cognitive Behavioral Therapy Solution-Focused Therapy Assertiveness Training Relapse Prevention Therapy   Paulla Dolly, MSW, [place], [place] 12/22/2020 12:02 PM
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
## 2020-12-22 NOTE — Progress Notes (Signed)
[facility] MD Progress Note  12/22/2020 2:01 PM GRIFFIN GERRARD  MRN:  [id number] Subjective:  Mr. Coggeshall is a 35 yr old male who presents with AMS after an overdose (Meth and Gabapentin). PPHx is significant for Polysubstance use disorder (EtOH, Opioid, Meth, Cocaine, THC), Bipolar 2, and approximately 38 hospitalizations.   He reports that he slept ok last night. He reports that his appetite is ok as well. Due to concern asked about any history of seizures. He reports that he has never had a seizure. He reports that he thinks he should not be on Gabapentin any more and is agreeable to weaning off of it. He reports not wanting to go to Rehab because he has been so many times. He thinks there is nothing new that they could tell him, he reports he knows what he needs to do he just needs to keep doing it and not relapse. He reports that he has never abused his Wellbutrin. He did ask several times if he could get his noon dose of 150 mg. Discussed that given the possibility of Wellbutrin causing a seizure and in the setting of weaning off the Gabapentin which is protective against seizures I was cautious to do so. He proceeded to make promises that he would not seizure on his Wellbutrin. He reports no SI, HI, or AVH. Principal Problem: Substance induced mood disorder (HCC) Diagnosis: Principal Problem:   Substance induced mood disorder (HCC) Active Problems:   Bipolar disorder ([place])  Total Time spent with patient: 20 minutes  I personally spent 20 minutes on the unit in direct patient care. The direct patient care time included face-to-face time with the patient, reviewing the patient's chart, communicating with other professionals, and coordinating care. Greater than 50% of this time was spent in counseling or coordinating care with the patient regarding goals of hospitalization, psycho-education, and discharge planning needs.   Past Psychiatric History: Polysubstance use disorder (EtOH, Opioid, Meth,  Cocaine, THC), Bipolar 2, and approximately 38 hospitalizations.  Past Medical History:  Past Medical History:  Diagnosis Date  . Alcohol abuse   . Anxiety   . Bipolar 2 disorder ([place])   . Cocaine abuse ([place])   . Depression   . Kidney stones   . Myocardial infarction ([place])   . Narcotic abuse ([place])   . Peptic ulcer   . Polysubstance abuse ([place])   . Renal disorder    kidney stones    Past Surgical History:  Procedure Laterality Date  . ESOPHAGOGASTRODUODENOSCOPY N/A 05/29/2014   Procedure: ESOPHAGOGASTRODUODENOSCOPY (EGD) ;  Surgeon: Beryle Beams, MD;  Location: [facility] ENDOSCOPY;  Service: Endoscopy;  Laterality: N/A;  check with Dr. Collene Mares about sedation type/timinng - I recommend MAC  . HAND SURGERY  2006  . LEFT HEART CATH AND CORONARY ANGIOGRAPHY N/A 06/21/2020   Procedure: LEFT HEART CATH AND CORONARY ANGIOGRAPHY;  Surgeon: Nelva Bush, MD;  Location: [facility] CV LAB;  Service: Cardiovascular;  Laterality: N/A;   Family History:  Family History  Problem Relation Age of Onset  . Heart disease Father    Family Psychiatric  History: Daughter- Mental Illness Social History:  Social History   Substance and Sexual Activity  Alcohol Use Not Currently     Social History   Substance and Sexual Activity  Drug Use Not Currently  . Types: Marijuana, Cocaine, IV, Methamphetamines   Comment: heroin and meth    Social History   Socioeconomic History  . Marital status: Single    Spouse name:  Not on file  . Number of children: Not on file  . Years of education: Not on file  . Highest education level: Not on file  Occupational History  . Not on file  Tobacco Use  . Smoking status: Current Every Day Smoker    Packs/day: 0.50    Types: Cigarettes  . Smokeless tobacco: Never Used  Vaping Use  . Vaping Use: Never used  Substance and Sexual Activity  . Alcohol use: Not Currently  . Drug use: Not Currently    Types: Marijuana, Cocaine, IV, Methamphetamines    Comment:  heroin and meth  . Sexual activity: Not Currently  Other Topics Concern  . Not on file  Social History Narrative   Lives with a friend   Lost house and custody of son 9broke up with fiancee)   Nature conservation officer   Social Determinants of Radio broadcast assistant Strain: Not on file  Food Insecurity: Not on file  Transportation Needs: Not on file  Physical Activity: Not on file  Stress: Not on file  Social Connections: Not on file   Additional Social History:                         Sleep: Fair  Appetite:  Fair  Current Medications: Current Facility-Administered Medications  Medication Dose Route Frequency Provider Last Rate Last Admin  . acetaminophen (TYLENOL) tablet 650 mg  650 mg Oral Q6H PRN Sharma Covert, MD      . alum & mag hydroxide-simeth (MAALOX/MYLANTA) 200-200-20 MG/5ML suspension 30 mL  30 mL Oral Q4H PRN Sharma Covert, MD      . buPROPion (WELLBUTRIN XL) 24 hr tablet 300 mg  300 mg Oral Daily Sharma Covert, MD   300 mg at 12/22/20 0736  . diazepam (VALIUM) tablet 2 mg  2 mg Oral Q12H PRN Sharma Covert, MD      . gabapentin (NEURONTIN) capsule 200 mg  200 mg Oral TID Sharma Covert, MD   200 mg at 12/22/20 1145  . hydrOXYzine (ATARAX/VISTARIL) tablet 25 mg  25 mg Oral TID PRN Sharma Covert, MD      . risperiDONE (RISPERDAL M-TABS) disintegrating tablet 2 mg  2 mg Oral Q8H PRN Sharma Covert, MD       And  . LORazepam (ATIVAN) tablet 1 mg  1 mg Oral Q6H PRN Sharma Covert, MD   1 mg at 12/22/20 1215   And  . ziprasidone (GEODON) injection 20 mg  20 mg Intramuscular Q6H PRN Sharma Covert, MD      . magnesium hydroxide (MILK OF MAGNESIA) suspension 30 mL  30 mL Oral Daily PRN Sharma Covert, MD      . pantoprazole (PROTONIX) EC tablet 40 mg  40 mg Oral BID AC Sharma Covert, MD   40 mg at 12/22/20 0736  . QUEtiapine (SEROQUEL) tablet 200 mg  200 mg Oral QHS Sharma Covert, MD   200 mg at 12/21/20  2054  . traZODone (DESYREL) tablet 50 mg  50 mg Oral QHS PRN Sharma Covert, MD        Lab Results:  Results for orders placed or performed during the hospital encounter of 12/21/20 (from the past 48 hour(s))  TSH     Status: None   Collection Time: 12/21/20  6:06 PM  Result Value Ref Range   TSH 1.372 0.350 - 4.500 uIU/mL  Comment: Performed by a 3rd Generation assay with a functional sensitivity of <=0.01 uIU/mL. Performed at [facility], [place] [address], [place], [place] [address]   Lipid panel     Status: None   Collection Time: 12/21/20  6:06 PM  Result Value Ref Range   Cholesterol 133 0 - 200 mg/dL   Triglycerides 81 <150 mg/dL   HDL 45 >40 mg/dL   Total CHOL/HDL Ratio 3.0 RATIO   VLDL 16 0 - 40 mg/dL   LDL Cholesterol 72 0 - 99 mg/dL    Comment:        Total Cholesterol/HDL:CHD Risk Coronary Heart Disease Risk Table                     Men   Women  1/2 Average Risk   3.4   3.3  Average Risk       5.0   4.4  2 X Average Risk   9.6   7.1  3 X Average Risk  23.4   11.0        Use the calculated Patient Ratio above and the CHD Risk Table to determine the patient's CHD Risk.        ATP III CLASSIFICATION (LDL):  <100     mg/dL   Optimal  100-129  mg/dL   Near or Above                    Optimal  130-159  mg/dL   Borderline  160-189  mg/dL   High  >190     mg/dL   Very High Performed at [facility] [address], [place], [place] [address]     Blood Alcohol level:  Lab Results  Component Value Date   ETH <10 12/16/2020   ETH <10 51/11/5850    Metabolic Disorder Labs: Lab Results  Component Value Date   HGBA1C 5.3 06/19/2020   MPG 105 06/19/2020   MPG 108.28 03/28/2018   No results found for: PROLACTIN Lab Results  Component Value Date   CHOL 133 12/21/2020   TRIG 81 12/21/2020   HDL 45 12/21/2020   CHOLHDL 3.0 12/21/2020   VLDL 16 12/21/2020   LDLCALC 72 12/21/2020   LDLCALC 88 03/28/2018     Physical Findings: AIMS: Facial and Oral Movements Muscles of Facial Expression: None, normal Lips and Perioral Area: None, normal Jaw: None, normal Tongue: None, normal,Extremity Movements Upper (arms, wrists, hands, fingers): None, normal Lower (legs, knees, ankles, toes): None, normal, Trunk Movements Neck, shoulders, hips: None, normal, Overall Severity Severity of abnormal movements (highest score from questions above): None, normal Incapacitation due to abnormal movements: None, normal Patient's awareness of abnormal movements (rate only patient's report): No Awareness, Dental Status Current problems with teeth and/or dentures?: No Does patient usually wear dentures?: No  CIWA:    COWS:     Musculoskeletal: Strength & Muscle Tone: within normal limits Gait & Station: normal Patient leans: N/A  Psychiatric Specialty Exam: Physical Exam Vitals and nursing note reviewed.  Constitutional:      General: He is not in acute distress.    Appearance: Normal appearance. He is normal weight. He is not ill-appearing, toxic-appearing or diaphoretic.  HENT:     Head: Normocephalic and atraumatic.  Cardiovascular:     Rate and Rhythm: Normal rate.  Pulmonary:     Effort: Pulmonary effort is normal.  Musculoskeletal:        General: Normal range  of motion.  Neurological:     General: No focal deficit present.     Mental Status: He is alert.     Review of Systems  Constitutional: Negative for fatigue and fever.  Respiratory: Negative for chest tightness and shortness of breath.   Cardiovascular: Negative for chest pain and palpitations.  Gastrointestinal: Negative for abdominal pain, constipation, diarrhea, nausea and vomiting.  Neurological: Negative for dizziness, weakness, light-headedness and headaches.  Psychiatric/Behavioral: Negative for suicidal ideas.    Blood pressure 98/70, pulse 93, temperature (!) 97.3 F (36.3 C), temperature source Oral, resp. rate 18,  height _0  (1.727 m), weight 76.7 kg, SpO2 100 %.Body mass index is 25.7 kg/m.  General Appearance: Casual and Disheveled  Eye Contact:  Fair  Speech:  Clear and Coherent and Normal Rate  Volume:  Normal  Mood:  Anxious  Affect:  Anxious  Thought Process:  Coherent  Orientation:  Full (Time, Place, and Person)  Thought Content:  Logical  Suicidal Thoughts:  No  Homicidal Thoughts:  No  Memory:  Immediate;   Fair Recent;   Fair  Judgement:  Impaired  Insight:  Shallow  Psychomotor Activity:  Normal  Concentration:  Concentration: Fair and Attention Span: Fair  Recall:  [employer] of Knowledge:  Good  Language:  Good  Akathisia:  Negative  Handed:  Right  AIMS (if indicated):     Assets:  Desire for Improvement Resilience  ADL's:  Intact  Cognition:  WNL  Sleep:        Treatment Plan Summary: Daily contact with patient to assess and evaluate symptoms and progress in treatment  Mr. Mcmahill is a 35 yr old male who presents with AMS after an overdose (Meth and Gabapentin). PPHx is significant for Polysubstance use disorder (EtOH, Opioid, Meth, Cocaine, THC), Bipolar 2, and approximately 38 hospitalizations.   Given the weaning off of Gabapentin and Wellbutrin use will not give additional 150 mg today. Can consider restarting his noon dose tomorrow as long as he continues to do well. Will continue Seizure precautions for now. Will have Social Work provide resources for temporary shelter until he can return to the [employer].   Bipolar 2: -Continue Seroquel 200 mg QHS -Continue Wellbutrin 300 mg AM   -Agitation Protocol Risperdal/Ativan/Geodon -Will decrease Gabapentin to 200 mg TID today -Continue Protonix 40 mg daily -Continue Valium 2 mg BID PRN muscle spasms -Continue PRN's: Tylenol, Maalox, Atarax, Milk of Magnesia, Trazodone    [name], MD 12/22/2020, 2:01 PM

## 2020-12-22 NOTE — Progress Notes (Signed)
[place] NOVEL CORONAVIRUS (COVID-19) DAILY CHECK-OFF SYMPTOMS - answer yes or no to each - every day NO YES  Have you had a fever in the past 24 hours?  . Fever (Temp > 37.80C / 100F) X   Have you had any of these symptoms in the past 24 hours? . New Cough .  Sore Throat  .  Shortness of Breath .  Difficulty Breathing .  Unexplained Body Aches   X   Have you had any one of these symptoms in the past 24 hours not related to allergies?   . Runny Nose .  Nasal Congestion .  Sneezing   X   If you have had runny nose, nasal congestion, sneezing in the past 24 hours, has it worsened?  X   EXPOSURES - check yes or no X   Have you traveled outside the state in the past 14 days?  X   Have you been in contact with someone with a confirmed diagnosis of COVID-19 or PUI in the past 14 days without wearing appropriate PPE?  X   Have you been living in the same home as a person with confirmed diagnosis of COVID-19 or a PUI (household contact)?    X   Have you been diagnosed with COVID-19?    X              What to do next: Answered NO to all: Answered YES to anything:   Proceed with unit schedule Follow the [facility] Inpatient Flowsheet.   

## 2020-12-22 NOTE — Progress Notes (Signed)
D. Pt presented with a sad affect, irritable mood, agitated at times in regards to "getting his meds right". Pt rated his depression, hopelessness and anxiety all 7's today. Pt wrote that his goal to work on was "getting back to my meds and trying to stay clean again!" Pt currently denies SI/HI and AVH A. Labs and vitals monitored. Pt given and educated on medications. Pt supported emotionally and encouraged to express concerns and ask questions.   R. Pt remains safe with 15 minute checks. Will continue POC.

## 2020-12-22 NOTE — [facility] Counselor (Signed)
CSW asked patient if he would like staff to arrange aftercare. Patient responded "it does not matter." CSW asked patient to clarify, patient did not provide verbal or written consent at this time. Patient is enrolled in services with [facility] on [address].   Signed:  Durenda Hurt, MSW, [place], [place] 12/22/2020 4:23 PM
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
## 2020-12-23 MED ORDER — DIAZEPAM 2 MG PO TABS
2.0000 mg | ORAL_TABLET | Freq: Every day | ORAL | Status: DC | PRN
  Administered 2020-12-23: 2 mg via ORAL
  Filled 2020-12-23: qty 1

## 2020-12-23 MED ORDER — GABAPENTIN 100 MG PO CAPS
100.0000 mg | ORAL_CAPSULE | Freq: Three times a day (TID) | ORAL | Status: DC
  Administered 2020-12-23 – 2020-12-24 (×2): 100 mg via ORAL
  Filled 2020-12-23 (×5): qty 1

## 2020-12-23 NOTE — Progress Notes (Signed)
Adult Psychoeducational Group Note  Date:  12/23/2020 Time:  8:43 PM  Group Topic/Focus:  Wrap-Up Group:   The focus of this group is to help patients review their daily goal of treatment and discuss progress on daily workbooks.  Participation Level:  Active  Participation Quality:  Appropriate  Affect:  Anxious and Blunted  Cognitive:  Alert  Insight: Improving  Engagement in Group:  Distracting  Modes of Intervention:  Limit-setting  Additional Comments:  Pt attend wrap up group. His day was a 5. His goal was to laugh and he meet his goal. The coping skills for him was listen to music. Pt took over tv. Patient said his concerns was a crepy guy stalling blonde hair, glasses from [place], his friend who is a male patient.   Lenice Llamas Long 12/23/2020, 8:43 PM

## 2020-12-23 NOTE — Progress Notes (Signed)
   12/23/20 2324  Psych Admission Type (Psych Patients Only)  Admission Status Involuntary  Psychosocial Assessment  Patient Complaints Anxiety  Eye Contact Fair  Facial Expression Animated  Affect Appropriate to circumstance  Speech Logical/coherent  Interaction Assertive  Motor Activity Fidgety  Appearance/Hygiene Unremarkable  Behavior Characteristics Appropriate to situation  Mood Anxious;Pleasant  Thought Process  Coherency WDL  Content WDL  Delusions None reported or observed  Perception WDL  Hallucination None reported or observed  Judgment Impaired  Confusion None  Danger to Self  Current suicidal ideation? Denies  Danger to Others  Danger to Others None reported or observed

## 2020-12-23 NOTE — Progress Notes (Signed)
D. Pt continues to present as irritable, but less than yesterday- pt has been calm and cooperative, but somewhat guarded during interactions. Pt has been visible in the milieu interacting appropriately with peers, and has been observed attending groups. Per pt's self inventory, pt rated his depression, hopelessness and anxiety a 6/5/8, respectively. Pt reports that his goal to work on is "getting my meds back on track, right dosage"  Pt currently denies SI/HI and AVH A. Labs and vitals monitored. Pt given and educated on medications. Pt supported emotionally and encouraged to express concerns and ask questions.   R. Pt remains safe with 15 minute checks. Will continue POC.

## 2020-12-23 NOTE — Progress Notes (Signed)
[facility] MD Progress Note  12/23/2020 9:48 AM Brian [name]  MRN:  [id number]  Subjective: Brian [name] reports, "The Dr. who saw me yesterday is tripping on me. He says he is not going to increase my Wellbutrin to 450 mg, my usual dose prior to me coming to the hospital. He says I overdosed on it, that is not true because I do not abuse that medicine. I'm not depressed, I have bipolar disorder. I do not get depressed. But, I'm not feeling good today at all. I'm craving Methamphetamine. I don't think going to any treatment program after discharge will benefit me. I have been to 35 different treatment programs already since I started drug use. None of the programs has helped as of yet. 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 I know if I get out of this hospital now, I'm going get high on Meth. Meth is my drug of choice. Do I worry about overdose, nope. I will probably die from Meth of dose one of these days. I'm not afraid of death. That's about it".  Objective: Patient is a 35 year old male with a past psychiatric history significant for methamphetamine dependence, substance-induced mood disorder.  The patient originally presented to the [facility] emergency department on 12/16/2020 after he had been brought from the [place] where he was living by EMS.  He was having auditory hallucinations.  He reported he had taken 30 Wellbutrin XL 300 mg tablets, and may also have taken some gabapentin.  He was unsure.  His blood pressure was elevated, he was mildly febrile, and was tachycardic.  Unfortunately on admission to the emergency department he had mental status changes, his blood alcohol on admission was less than 10, drug screen was only positive for benzodiazepines.  He was admitted to the medical service.   Daily Notes:  Brian [name] is seen, chart reviewed. The chart findings discussed with the treatment team. He is alert, oriented &  aware of situation. He is visible on the unit, attending group sessions. He presents today expressing feeling of hopelessness/helplessness about his struggle with substance use, mostly Methamphetamine. He presents today discouraged, feels he does not think he will be able to stop drug use. He does not believe that any treatment program out there will help him because he has been through 50 of those in the past. He says this is his # 84th psychiatric admission over the course of his drug use. He feels he has nothing out there for him to live for as he has lost everything that mattered to him from his home, business to his family. He does however deny any SIHI, AVH, delusional thoughts or paranoia. He does not appear to be responding to any internal stimuli. He continues to request for his Wellbutrin to be increased to 450 mg daily. This is declined. He is encouraged to stay focus & participate in the group sessions as he is already doing.   Principal Problem: Substance induced mood disorder ([place])  Diagnosis:   Patient Active Problem List   Diagnosis Date Noted  . Substance induced mood disorder ([place]) [F19.94] 03/26/2018    Priority: High  . Opioid use disorder, severe, dependence ([place]) [F11.20] 03/08/2015    Priority: High  . 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 Bipolar disorder ([place]) [F31.9] 12/21/2020  . Alcohol abuse [F10.10] 12/17/2020  . PUD (peptic  ulcer disease) [K27.9] 12/17/2020  . Drug overdose [T50.901A] 12/17/2020  . 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 Gastroesophageal reflux disease [K21.9] 10/10/2020  . Bupropion overdose [T43.291A] 08/13/2020  . Substance use disorder [F19.90] 08/13/2020  . Suicidal ideation [R45.851] 08/13/2020  . Pericarditis [I31.9] 07/02/2020  . Depression [F32.A] 07/02/2020  . Elevated troponin [R77.8]   . Non-ST elevation (NSTEMI) myocardial infarction ([place]) [I21.4]   . Chest pain  [R07.9] 06/19/2020  . 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 Malingering [Z76.5] 06/04/2020  . Paranoia ([place]) [F22]   . Bipolar II disorder, severe, depressed, with anxious distress ([place]) [F31.81] 03/25/2018  . Cellulitis [L03.90] 05/20/2015  . Dysuria [R30.0] 05/20/2015  . Smoker [F17.200] 05/20/2015  . Homeless single person [Z59.00] 05/20/2015  . Drug abuse ([place]) [F19.10]   . Ileus of unspecified type ([place]) [K56.7] 05/28/2014  . Abdominal pain [R10.9] 05/28/2014  . AKI (acute kidney injury) ([place]) [N17.9] 05/28/2014  . Esophagitis, acute [K20.90] 05/28/2014  . Polysubstance abuse ([place]) [F19.10] 05/28/2014  . Hematemesis [K92.0] 05/28/2014   Total Time spent with patient: 25 minutes  Past Psychiatric History: See H&P  Past Medical History:  Past Medical History:  Diagnosis Date  . Alcohol abuse   . Anxiety   . Bipolar 2 disorder ([place])   . Cocaine abuse ([place])   . Depression   . Kidney stones   . Myocardial infarction ([place])   . Narcotic abuse ([place])   . Peptic ulcer   . Polysubstance abuse ([place])   . Renal disorder    kidney stones    Past Surgical History:  Procedure Laterality Date  . ESOPHAGOGASTRODUODENOSCOPY N/A 05/29/2014   Procedure: ESOPHAGOGASTRODUODENOSCOPY (EGD) ;  Surgeon: Beryle Beams, MD;  Location: [facility] ENDOSCOPY;  Service: Endoscopy;  Laterality: N/A;  check with Dr. Collene Mares about sedation type/timinng - I recommend MAC  . HAND SURGERY  2006  . LEFT HEART CATH AND CORONARY ANGIOGRAPHY N/A 06/21/2020   Procedure: LEFT HEART CATH AND CORONARY ANGIOGRAPHY;  Surgeon: Nelva Bush, MD;  Location: [facility];  Service: Cardiovascular;  Laterality: N/A;   Family History:  Family History  Problem Relation Age of Onset  . Heart disease Father    Family Psychiatric  History: See H&P  Social History:  Social History   Substance and Sexual Activity  Alcohol Use Not Currently     Social History   Substance and Sexual  Activity  Drug Use Not Currently  . Types: Marijuana, Cocaine, IV, Methamphetamines   Comment: heroin and meth    Social History   Socioeconomic History  . Marital status: Single    Spouse name: Not on file  . Number of children: Not on file  . Years of education: Not on file  . Highest education level: Not on file  Occupational History  . Not on file  Tobacco Use  . Smoking status: Current Every Day Smoker    Packs/day: 0.50    Types: Cigarettes  . Smokeless tobacco: Never Used  Vaping Use  . Vaping Use: Never used  Substance and Sexual Activity  . Alcohol use: Not Currently  . Drug use: Not Currently    Types: Marijuana, Cocaine, IV, Methamphetamines    Comment: heroin and meth  . Sexual activity: Not Currently  Other Topics Concern  . Not on file  Social History Narrative   Lives with a friend   Lost house and custody of son 9broke up with fiancee)   Nature conservation officer  Social Determinants of Health   Financial Resource Strain: Not on file  Food Insecurity: Not on file  Transportation Needs: Not on file  Physical Activity: Not on file  Stress: Not on file  Social Connections: Not on file   Additional Social History:   Sleep: Good  Appetite:  Good  Current Medications: Current Facility-Administered Medications  Medication Dose Route Frequency Provider Last Rate Last Admin  . acetaminophen (TYLENOL) tablet 650 mg  650 mg Oral Q6H PRN Sharma Covert, MD      . alum & mag hydroxide-simeth (MAALOX/MYLANTA) 200-200-20 MG/5ML suspension 30 mL  30 mL Oral Q4H PRN Sharma Covert, MD      . buPROPion (WELLBUTRIN XL) 24 hr tablet 300 mg  300 mg Oral Daily Sharma Covert, MD   300 mg at 12/23/20 0729  . diazepam (VALIUM) tablet 2 mg  2 mg Oral Q12H PRN Sharma Covert, MD   2 mg at 12/22/20 2109  . gabapentin (NEURONTIN) capsule 200 mg  200 mg Oral TID Sharma Covert, MD   200 mg at 12/23/20 1275  . hydrOXYzine (ATARAX/VISTARIL) tablet 25 mg  25  mg Oral TID PRN Sharma Covert, MD      . risperiDONE (RISPERDAL M-TABS) disintegrating tablet 2 mg  2 mg Oral Q8H PRN Sharma Covert, MD       And  . LORazepam (ATIVAN) tablet 1 mg  1 mg Oral Q6H PRN Sharma Covert, MD   1 mg at 12/22/20 1215   And  . ziprasidone (GEODON) injection 20 mg  20 mg Intramuscular Q6H PRN Sharma Covert, MD      . magnesium hydroxide (MILK OF MAGNESIA) suspension 30 mL  30 mL Oral Daily PRN Sharma Covert, MD      . pantoprazole (PROTONIX) EC tablet 40 mg  40 mg Oral BID AC Sharma Covert, MD   40 mg at 12/23/20 0729  . QUEtiapine (SEROQUEL) tablet 200 mg  200 mg Oral QHS Sharma Covert, MD   200 mg at 12/22/20 2109  . traZODone (DESYREL) tablet 50 mg  50 mg Oral QHS PRN Sharma Covert, MD       Lab Results:  Results for orders placed or performed during the hospital encounter of 12/21/20 (from the past 48 hour(s))  TSH     Status: None   Collection Time: 12/21/20  6:06 PM  Result Value Ref Range   TSH 1.372 0.350 - 4.500 uIU/mL    Comment: Performed by a 3rd Generation assay with a functional sensitivity of <=0.01 uIU/mL. Performed at [facility], [place] [address] [place], [place] [address]   Hemoglobin A1c     Status: Abnormal   Collection Time: 12/21/20  6:06 PM  Result Value Ref Range   Hgb A1c MFr Bld 5.9 (H) 4.8 - 5.6 %    Comment: (NOTE) Pre diabetes:          5.7%-6.4%  Diabetes:              >6.4%  Glycemic control for   <7.0% adults with diabetes    Mean Plasma Glucose 122.63 mg/dL    Comment: Performed at [place] [facility] [address], [place],  [address]  Lipid panel     Status: None   Collection Time: 12/21/20  6:06 PM  Result Value Ref Range   Cholesterol 133 0 - 200 mg/dL   Triglycerides 81 <150 mg/dL  HDL 45 >40 mg/dL   Total CHOL/HDL Ratio 3.0 RATIO   VLDL 16 0 - 40 mg/dL   LDL Cholesterol 72 0 - 99 mg/dL    Comment:        Total Cholesterol/HDL:CHD Risk Coronary  Heart Disease Risk Table                     Men   Women  1/2 Average Risk   3.4   3.3  Average Risk       5.0   4.4  2 X Average Risk   9.6   7.1  3 X Average Risk  23.4   11.0        Use the calculated Patient Ratio above and the CHD Risk Table to determine the patient's CHD Risk.        ATP III CLASSIFICATION (LDL):  <100     mg/dL   Optimal  100-129  mg/dL   Near or Above                    Optimal  130-159  mg/dL   Borderline  160-189  mg/dL   High  >190     mg/dL   Very High Performed at [facility] [place] [address] [place], [place] [address]    Blood Alcohol level:  Lab Results  Component Value Date   ETH <10 12/16/2020   ETH <10 38/25/0539   Metabolic Disorder Labs: Lab Results  Component Value Date   HGBA1C 5.9 (H) 12/21/2020   MPG 122.63 12/21/2020   MPG 105 06/19/2020   No results found for: PROLACTIN Lab Results  Component Value Date   CHOL 133 12/21/2020   TRIG 81 12/21/2020   HDL 45 12/21/2020   CHOLHDL 3.0 12/21/2020   VLDL 16 12/21/2020   LDLCALC 72 12/21/2020   LDLCALC 88 03/28/2018   Physical Findings:  AIMS: Facial and Oral Movements Muscles of Facial Expression: None, normal Lips and Perioral Area: None, normal Jaw: None, normal Tongue: None, normal,Extremity Movements Upper (arms, wrists, hands, fingers): None, normal Lower (legs, knees, ankles, toes): None, normal, Trunk Movements Neck, shoulders, hips: None, normal, Overall Severity Severity of abnormal movements (highest score from questions above): None, normal Incapacitation due to abnormal movements: None, normal Patient's awareness of abnormal movements (rate only patient's report): No Awareness, Dental Status Current problems with teeth and/or dentures?: No Does patient usually wear dentures?: No  CIWA:    COWS:     Musculoskeletal: Strength & Muscle Tone: within normal limits Gait & Station: normal Patient leans: N/A  Psychiatric Specialty  Exam: Physical Exam Vitals and nursing note reviewed.  HENT:     Head: Normocephalic.     Nose: Nose normal.     Mouth/Throat:     Pharynx: Oropharynx is clear.  Eyes:     Pupils: Pupils are equal, round, and reactive to light.  Cardiovascular:     Rate and Rhythm: Normal rate.     Pulses: Normal pulses.  Pulmonary:     Effort: Pulmonary effort is normal.  Genitourinary:    Comments: Deferred Musculoskeletal:        General: Normal range of motion.     Cervical back: Normal range of motion.  Skin:    General: Skin is warm and dry.  Neurological:     General: No focal deficit present.     Mental Status: He is alert and oriented to person, place, and time.  Review of Systems  Constitutional: Negative for chills, diaphoresis and fever.  HENT: Negative for congestion and sore throat.   Eyes: Negative for blurred vision.  Respiratory: Negative for cough, shortness of breath and wheezing.   Cardiovascular: Negative for chest pain and palpitations.  Gastrointestinal: Negative for diarrhea, heartburn, nausea and vomiting.  Genitourinary: Negative for dysuria.  Musculoskeletal: Negative for joint pain and myalgias.  Skin: Negative.   Neurological: Negative for dizziness, tingling, tremors, sensory change, speech change, focal weakness, seizures, loss of consciousness, weakness and headaches.  Endo/Heme/Allergies: Negative for environmental allergies. Does not bruise/bleed easily.       Food allergies: Fish.  Psychiatric/Behavioral: Positive for depression and substance abuse. Negative for hallucinations, memory loss and suicidal ideas. The patient has insomnia. The patient is not nervous/anxious.    denies headache, no chest pain, no shortness of breath, no vomiting   Blood pressure 105/77, pulse (!) 105, temperature 97.6 F (36.4 C), temperature source Oral, resp. rate 18, height _0  (1.727 m), weight 76.7 kg, SpO2 100 %.Body mass index is 25.7 kg/m.  General Appearance:  Disheveled  Eye Contact:  Fair  Speech:  Clear and Coherent and Normal Rate  Volume:  Normal  Mood:  "I'm no depressed, I don't depressed". Presents hopeless.  Affect:  Flat  Thought Process:  Coherent and Descriptions of Associations: Intact  Orientation:  Full (Time, Place, and Person)  Thought Content:  denies hallucinations, no delusions, not internally preoccupied   Suicidal Thoughts:  No denies suicidal plan or intention.  Homicidal Thoughts:  No  Memory:  Immediate;   Fair Recent;   Fair Remote;   Fair  Judgement:  Fair  Insight:  Lacking  Psychomotor Activity:  Decreased  Concentration:  Concentration: Fair and Attention Span: Fair  Recall:  [employer] of Knowledge:  Fair  Language:  Good  Akathisia:  Negative  Handed:  Right  AIMS (if indicated):     Assets:  Communication Skills Resilience  ADL's:  Intact  Cognition:  WNL  Sleep:  Number of Hours: 6.75    Treatment Plan Summary: Daily contact with patient to assess and evaluate symptoms and progress in treatment and Medication management   - Continue inpatient hospitalization. - Will continue today 12/23/2020 plan as below except where it is noted.  Mood control Continue Seroquel 200 mg po qhs.  Depression Continue Wellbutrin XL 300 mg po daily.  Anxiety   Continue Valium 2 mg po Q 12 hours prn for muscle spasms.          Continue gabapentin 267m po TID Continue atarax 215mpo q8h prn.  Insomnia Continue trazodone 50 mg po qhs prn.  Other prns. Continue Tylenol 650 mg po Q 6 hrs prn for pain/fever. Continue Mylanta 30 ml po Q 4 hrs prn for indigestion. Continue mOM 30 ml po prn daily for constipation. Continue the agitation protocols as recommended prn for psychosis.  Encourage participation in groups and therapeutic milieu Disposition planning will be ongoing   AgLindell SparNP, PMHNP, FNP-BC 12/23/2020, 9:48 AM  Patient ID: PhRebeca Allegramale   DOB: 06/1986/12/[id number].[id number].   MRN:  [id number]

## 2020-12-23 NOTE — Progress Notes (Signed)
   12/23/20 2320  COVID-19 Daily Checkoff  Have you had a fever (temp > 37.80C/100F)  in the past 24 hours?  No  If you have had runny nose, nasal congestion, sneezing in the past 24 hours, has it worsened? No  COVID-19 EXPOSURE  Have you traveled outside the state in the past 14 days? No  Have you been in contact with someone with a confirmed diagnosis of COVID-19 or PUI in the past 14 days without wearing appropriate PPE? No  Have you been living in the same home as a person with confirmed diagnosis of COVID-19 or a PUI (household contact)? No  Have you been diagnosed with COVID-19? No

## 2020-12-23 NOTE — [facility] Group Notes (Signed)
Adult Psychoeducational Group Not Date:  12/23/2020 Time:  [id number] Group Topic/Focus: PROGRESSIVE RELAXATION. A group where deep breathing is taught and tensing and relaxation muscle groups is used. Imagery is used as well.  Pts are asked to imagine 3 pillars that hold them up when they are not able to hold themselves up.  Participation Level:  Active  Participation Quality:  Appropriate  Affect:  Appropriate  Cognitive:  Oriented  Insight: Improving  Engagement in Group:  Engaged  Modes of Intervention:  Activity, Discussion, Education, and Support  Additional Comments:  Rates his energy at a 3/10. States what holds him up when he is not able to hold himself up is his best friend, his don and His Demon.  Brian [name]

## 2020-12-23 NOTE — [facility] Group Notes (Signed)
Psychoeducational Group Note    Date:12/23/2020 Time: 1300-1400    Life Skills:  A group where two lists are made. What people need and what are things that we do that are healthy. The lists are developed by the patients and it is explained that we often do the actions that are not healthy to get our list of needs met.   Purpose of Group: . The group focus' on teaching patients on how to identify their needs and how to develop the coping skills needed to get their needs met  Participation Level:  Active  Participation Quality:  Appropriate  Affect:  Appropriate  Cognitive:  Oriented  Insight:  Improving  Engagement in Group:  Engaged  Additional Comments:  Pt  Rates his energy at a 2/10  Participated fully in the group and the discussion.Marveen Reeks, Elnora Morrison

## 2020-12-23 NOTE — Progress Notes (Signed)
[place] NOVEL CORONAVIRUS (COVID-19) DAILY CHECK-OFF SYMPTOMS - answer yes or no to each - every day NO YES  Have you had a fever in the past 24 hours?  . Fever (Temp > 37.80C / 100F) X   Have you had any of these symptoms in the past 24 hours? . New Cough .  Sore Throat  .  Shortness of Breath .  Difficulty Breathing .  Unexplained Body Aches   X   Have you had any one of these symptoms in the past 24 hours not related to allergies?   . Runny Nose .  Nasal Congestion .  Sneezing   X   If you have had runny nose, nasal congestion, sneezing in the past 24 hours, has it worsened?  X   EXPOSURES - check yes or no X   Have you traveled outside the state in the past 14 days?  X   Have you been in contact with someone with a confirmed diagnosis of COVID-19 or PUI in the past 14 days without wearing appropriate PPE?  X   Have you been living in the same home as a person with confirmed diagnosis of COVID-19 or a PUI (household contact)?    X   Have you been diagnosed with COVID-19?    X              What to do next: Answered NO to all: Answered YES to anything:   Proceed with unit schedule Follow the [facility] Inpatient Flowsheet.   

## 2020-12-23 NOTE — [facility] Group Notes (Signed)
[place] LCSW Group Therapy Note  Date/Time:  12/23/2020 9:00-10:00 or 10:00-11:00AM  Type of Therapy and Topic:  Group Therapy:  Healthy and Unhealthy Supports  Participation Level:  Active   Description of Group:  Patients in this group were introduced to the idea of adding a variety of healthy supports to address the various needs in their lives.Patients discussed what additional healthy supports could be helpful in their recovery and wellness after discharge in order to prevent future hospitalizations.   An emphasis was placed on using counselor, doctor, therapy groups, 12-step groups, and problem-specific support groups to expand supports.  They also worked as a group on developing a specific plan for several patients to deal with unhealthy supports through [place], psychoeducation with loved ones, and even termination of relationships.   Therapeutic Goals:   1)  discuss importance of adding supports to stay well once out of the hospital  2)  compare healthy versus unhealthy supports and identify some examples of each  3)  generate ideas and descriptions of healthy supports that can be added  4)  offer mutual support about how to address unhealthy supports  5)  encourage active participation in and adherence to discharge plan    Summary of Patient Progress:  The patient stated that current healthy supports in her life are friends, his sponsor and 12 step group while current unhealthy supports include family. The patient expressed a willingness to add "feeling loved by his family as support(s) to help in her recovery journey.   Therapeutic Modalities:   Motivational Interviewing Brief Solution-Focused Therapy  Rolanda Jay

## 2020-12-24 DIAGNOSIS — F1994 Other psychoactive substance use, unspecified with psychoactive substance-induced mood disorder: Secondary | ICD-10-CM

## 2020-12-24 MED ORDER — BUPROPION HCL ER (XL) 300 MG PO TB24: 300.0000 mg | ORAL_TABLET | Freq: Every day | ORAL | 0 refills | Status: DC

## 2020-12-24 MED ORDER — QUETIAPINE FUMARATE 200 MG PO TABS: 200.0000 mg | ORAL_TABLET | Freq: Every day | ORAL | 1 refills | Status: DC

## 2020-12-24 MED ORDER — PANTOPRAZOLE SODIUM 40 MG PO TBEC: 40.0000 mg | DELAYED_RELEASE_TABLET | Freq: Two times a day (BID) | ORAL | 0 refills | Status: DC

## 2020-12-24 NOTE — Progress Notes (Signed)
Spiritual care group on grief and loss facilitated by chaplain Jerene Pitch MDiv, [facility]  Group Goal:  Support / Education around grief and loss  Members engage in facilitated group support and psycho-social education.  Group Description:  Following introductions and group rules, group members engaged in facilitated group dialog and support around topic of loss, with particular support around experiences of loss in their lives. Group Identified types of loss (relationships / self / things) and identified patterns, circumstances, and changes that precipitate losses. Reflected on thoughts / feelings around loss, normalized grief responses, and recognized variety in grief experience. Group noted Worden's four tasks of grief in discussion.  Group drew on Adlerian / Rogerian, narrative, MI,  Patient Progress:  PT invited.  Did not attend.  Noted he was discharging.

## 2020-12-24 NOTE — [facility] Suicide Risk Assessment (Signed)
[facility] Discharge Suicide Risk Assessment   Principal Problem: Substance induced mood disorder ([place]) Discharge Diagnoses: Principal Problem:   Substance induced mood disorder ([place]) Active Problems:   Bipolar disorder ([place])   Total Time spent with patient: 15 minutes  Musculoskeletal: Strength & Muscle Tone: within normal limits Gait & Station: normal Patient leans: N/A  Psychiatric Specialty Exam: Review of Systems  All other systems reviewed and are negative.   Blood pressure 103/80, pulse 93, temperature 97.6 F (36.4 C), temperature source Oral, resp. rate 18, height '5\' 8"'$  (1.727 m), weight 76.7 kg, SpO2 100 %.Body mass index is 25.7 kg/m.  General Appearance: Fairly Groomed  Engineer, water::  Good  Speech:  Normal Rate409  Volume:  Normal  Mood:  Irritable  Affect:  Congruent  Thought Process:  Coherent and Descriptions of Associations: Intact  Orientation:  Full (Time, Place, and Person)  Thought Content:  Logical  Suicidal Thoughts:  No  Homicidal Thoughts:  No  Memory:  Immediate;   Fair Recent;   Fair Remote;   Fair  Judgement:  Intact  Insight:  Lacking  Psychomotor Activity:  Normal  Concentration:  Good  Recall:  [place] of Knowledge:Good  Language: Good  Akathisia:  Negative  Handed:  Right  AIMS (if indicated):     Assets:  Desire for Improvement Resilience  Sleep:  Number of Hours: 6.5  Cognition: WNL  ADL's:  Intact   Mental Status Per Nursing Assessment::   On Admission:  NA  Demographic Factors:  Male, Caucasian, Low socioeconomic status, Living alone and Unemployed  Loss Factors: Financial problems/change in socioeconomic status  Historical Factors: Impulsivity  Risk Reduction Factors:   NA  Continued Clinical Symptoms:  Depression:   Comorbid alcohol abuse/dependence Impulsivity Alcohol/Substance Abuse/Dependencies  Cognitive Features That Contribute To Risk:  Thought constriction (tunnel vision)    Suicide Risk:  Minimal:  No identifiable suicidal ideation.  Patients presenting with no risk factors but with morbid ruminations; may be classified as minimal risk based on the severity of the depressive symptoms    Plan Of Care/Follow-up recommendations:  Activity:  ad lib  Sharma Covert, MD 12/24/2020, 8:54 AM

## 2020-12-24 NOTE — Progress Notes (Signed)
Discharge Note:  Patient denies SI/HI AVH at this time. Discharge instructions, AVS, prescriptions and transition record gone over with patient. Patient agrees to comply with medication management, follow-up visit, and outpatient therapy. Patient belongings returned to patient. Patient questions and concerns addressed and answered.  Patient ambulatory off unit.  Patient discharged to home with friend.   

## 2020-12-24 NOTE — Progress Notes (Signed)
Brian [name] is a 35 year old male who presents to [facility] outpatient for a scheduled SAIOP intake appointment with this Probation officer. Client reports meth to be his drug(s) of choice. Client reports he was been sober for 2.5 years until he relapsed in Febuaray 2021 (Route of use: snort). Client was assessed on 10/14/2020 and meets criteria for ASAM Level 2.1 treatment. Brian [name] is being recommended for Substance Abuse Intensive Outpatient Program [facility]).   Client declined the treatment recommendation to attend [place] group therapy on Mondays, Wednesdays, and Fridays from 9:00am - 12:00pm.     Allyne Gee, Counselor

## 2020-12-24 NOTE — Progress Notes (Signed)
  [facility] Adult Case Management Discharge Plan :  Will you be returning to the same living situation after discharge:  Yes,  to [facility] At discharge, do you have transportation home?: Yes,  provided bus tickets Do you have the ability to pay for your medications: Yes,  has insurance  Release of information consent forms completed and in the chart;  Patient's signature needed at discharge.  Patient to Follow up at:  [facility] Follow up.   Specialty: Behavioral Health Why: Please see this provider for walk-in appointments for medication management and therapy.  Contact information: [place] [phone]              Next level of care provider has access to [place] and Suicide Prevention discussed: Yes,  w/ pt  Have you used any form of tobacco in the last 30 days? (Cigarettes, Smokeless Tobacco, Cigars, and/or Pipes): Yes  Has patient been referred to the Quitline?: Patient refused referral  Patient has been referred for addiction treatment: Pt. refused referral  Eliott Nine 12/24/2020, 9:55 AM

## 2020-12-24 NOTE — Discharge Summary (Signed)
Physician Discharge Summary Note  Patient:  Brian [name] is an 35 y.o., male MRN:  [id number] DOB:  04-Oct-1986 Patient phone:  [phone] (home)  Patient address:   [place] [address],  Total Time spent with patient: 20 minutes  I personally spent 20 minutes on the unit in direct patient care. The direct patient care time included face-to-face time with the patient, reviewing the patient's chart, communicating with other professionals, and coordinating care. Greater than 50% of this time was spent in counseling or coordinating care with the patient regarding goals of hospitalization, psycho-education, and discharge planning needs.   Date of Admission:  12/21/2020 Date of Discharge: 12/24/2020  Reason for Admission:  H&P- "This is one of several admission assessments in this [facility] for this 65 year old Caucasian male with hx of polysubstance use disorder & Bipolar disorder. Admitted to the [facility] from the [facility] with complaints of altered mental status triggered by drug overdose on a mixture of Methamphetamine & a bottle of gabapentin capsules. Patient apparently had relapsed after 4 months sobriety. His UDS upon admission at the ED was positive for just Benzodiazepine.  During this assessment, Genaro reports, "My memory is very foggy right now because all I have been through. I was at the [facility] this time for 5 days. The truth is I pretty much relapsed on drugs again after 4 months sobriety. I have had 3 years sobriety or clean time overall. However, my 68 younger sister who has never been a drug addict overdosed on Fentanyl last week, she almost did not make it. I thought she had died. She is still in the hospital. This kid looks up to me. I lost it, my emotion was all over the place. That incident triggered it for me. I relapsed. 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 Meth cured me from heroin & became my main drug of choice.  So, this time around, I did some methamphetamine, then took too many 300 mg of gabapentin pills, a whole bottle of it in one week. I love gabapentin because it numbs me from feeling stuff. It helps me to forget my problems. It helped me to deal with my sister's issues. 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 He went to jail. 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 They all got frustrated. They all relapsed the same time because nothing was going well. To make matters worse, I learnt that my brother has gone to jail as well. I saw my whole world crumbling in front of me. All these happened back to back last week.  I continued to use Meth & gabapentin till I woke in the hospital.  I never did overdose on Wellbutrin. I love my Wellbutrin, Seroquel & Remeron. I did not abuse those. I need them. They help me with emotion. I'm doing okay right now, just having a lot of guilt"."   Principal Problem: Substance induced mood disorder ([place]) Discharge Diagnoses: Principal Problem:   Substance induced mood disorder ([place]) Active Problems:   Bipolar disorder ([place])   Past Psychiatric History: Polysubstance use disorder (EtOH, Opioid, Meth, Cocaine, THC), Bipolar 2, and approximately 38 hospitalizations.  Past Medical History:  Past Medical History:  Diagnosis Date  . Alcohol abuse   . Anxiety   . Bipolar 2 disorder (  [place])   . Cocaine abuse ([place])   . Depression   . Kidney stones   . Myocardial infarction ([place])   . Narcotic abuse ([place])   . Peptic ulcer   . Polysubstance abuse ([place])   . Renal disorder    kidney stones    Past Surgical History:  Procedure Laterality Date  . ESOPHAGOGASTRODUODENOSCOPY N/A 05/29/2014   Procedure: ESOPHAGOGASTRODUODENOSCOPY (EGD) ;  Surgeon: Beryle Beams, MD;  Location: [facility] ENDOSCOPY;  Service: Endoscopy;  Laterality: N/A;  check with Dr.  Collene Mares about sedation type/timinng - I recommend MAC  . HAND SURGERY  2006  . LEFT HEART CATH AND CORONARY ANGIOGRAPHY N/A 06/21/2020   Procedure: LEFT HEART CATH AND CORONARY ANGIOGRAPHY;  Surgeon: Nelva Bush, MD;  Location: [place] CV LAB;  Service: Cardiovascular;  Laterality: N/A;   Family History:  Family History  Problem Relation Age of Onset  . Heart disease Father    Family Psychiatric  History: Daughter- Mental Illness Social History:  Social History   Substance and Sexual Activity  Alcohol Use Not Currently     Social History   Substance and Sexual Activity  Drug Use Not Currently  . Types: Marijuana, Cocaine, IV, Methamphetamines   Comment: heroin and meth    Social History   Socioeconomic History  . Marital status: Single    Spouse name: Not on file  . Number of children: Not on file  . Years of education: Not on file  . Highest education level: Not on file  Occupational History  . Not on file  Tobacco Use  . Smoking status: Current Every Day Smoker    Packs/day: 0.50    Types: Cigarettes  . Smokeless tobacco: Never Used  Vaping Use  . Vaping Use: Never used  Substance and Sexual Activity  . Alcohol use: Not Currently  . Drug use: Not Currently    Types: Marijuana, Cocaine, IV, Methamphetamines    Comment: heroin and meth  . Sexual activity: Not Currently  Other Topics Concern  . Not on file  Social History Narrative   Lives with a friend   Lost house and custody of son 9broke up with fiancee)   Nature conservation officer   Social Determinants of Radio broadcast assistant Strain: Not on file  Food Insecurity: Not on file  Transportation Needs: Not on file  Physical Activity: Not on file  Stress: Not on file  Social Connections: Not on file    Hospital Course:  Patient presented to [facility] on 2/20 due to [place] from an overdose (Gabapentin). He was admitted to [facility] on 2/25. He was restarted on his Seroquel. Given his overdose he was tapered off of  his Gabapentin and his Valium was stopped. Given his past history on chart review of Seizures and Wellbutrin overdose he was not restarted on 450 mg of Wellbutrin but only 300 mg and was monitored on Seizure precautions. He tolerated the medication changes well.  On day of discharge he reports that he has been sleeping ok and that his appetite is good. He reports no SI, HI, or AVH. He reports that he plans to return to the [facility] he has been staying at. Because he has to stay out of the house for 14 days because of his relapse he will be sent to the [facility] to arrange housing and provide medications while he awaits his return to the [facility].   Physical Findings: AIMS: Facial and Oral Movements Muscles of Facial Expression: None,  normal Lips and Perioral Area: None, normal Jaw: None, normal Tongue: None, normal,Extremity Movements Upper (arms, wrists, hands, fingers): None, normal Lower (legs, knees, ankles, toes): None, normal, Trunk Movements Neck, shoulders, hips: None, normal, Overall Severity Severity of abnormal movements (highest score from questions above): None, normal Incapacitation due to abnormal movements: None, normal Patient's awareness of abnormal movements (rate only patient's report): No Awareness, Dental Status Current problems with teeth and/or dentures?: No Does patient usually wear dentures?: No  CIWA:    COWS:     Musculoskeletal: Strength & Muscle Tone: within normal limits Gait & Station: normal Patient leans: N/A  Psychiatric Specialty Exam: Physical Exam Vitals and nursing note reviewed.  Constitutional:      General: He is not in acute distress.    Appearance: Normal appearance. He is normal weight. He is not ill-appearing or diaphoretic.  HENT:     Head: Normocephalic and atraumatic.  Cardiovascular:     Rate and Rhythm: Normal rate.  Pulmonary:     Effort: Pulmonary effort is normal.  Musculoskeletal:        General: Normal range of motion.   Neurological:     General: No focal deficit present.     Mental Status: He is alert.     Review of Systems  Constitutional: Negative for fatigue and fever.  Respiratory: Negative for chest tightness and shortness of breath.   Cardiovascular: Negative for chest pain and palpitations.  Gastrointestinal: Negative for abdominal pain, constipation, diarrhea, nausea and vomiting.  Neurological: Negative for dizziness, weakness, light-headedness and headaches.  Psychiatric/Behavioral: Negative for suicidal ideas.    Blood pressure 103/80, pulse 93, temperature 97.6 F (36.4 C), temperature source Oral, resp. rate 18, height _0  (1.727 m), weight 76.7 kg, SpO2 100 %.Body mass index is 25.7 kg/m.  General Appearance: Casual  Eye Contact:  Fair  Speech:  Clear and Coherent and Normal Rate  Volume:  Normal  Mood:  ok  Affect:  Appropriate  Thought Process:  Coherent  Orientation:  Full (Time, Place, and Person)  Thought Content:  Logical  Suicidal Thoughts:  No  Homicidal Thoughts:  No  Memory:  Immediate;   Good Recent;   Good  Judgement:  Intact  Insight:  Lacking  Psychomotor Activity:  Normal  Concentration:  Concentration: Good and Attention Span: Good  Recall:  Good  Fund of Knowledge:  Good  Language:  Good  Akathisia:  Negative  Handed:  Right  AIMS (if indicated):     Assets:  Desire for Improvement Resilience  ADL's:  Intact  Cognition:  WNL  Sleep:  Number of Hours: 6.5     Have you used any form of tobacco in the last 30 days? (Cigarettes, Smokeless Tobacco, Cigars, and/or Pipes): Yes  Has this patient used any form of tobacco in the last 30 days? (Cigarettes, Smokeless Tobacco, Cigars, and/or Pipes) Yes, declined cessation prescriptions  Blood Alcohol level:  Lab Results  Component Value Date   ETH <10 12/16/2020   ETH <10 61/95/0932    Metabolic Disorder Labs:  Lab Results  Component Value Date   HGBA1C 5.9 (H) 12/21/2020   MPG 122.63 12/21/2020    MPG 105 06/19/2020   No results found for: PROLACTIN Lab Results  Component Value Date   CHOL 133 12/21/2020   TRIG 81 12/21/2020   HDL 45 12/21/2020   CHOLHDL 3.0 12/21/2020   VLDL 16 12/21/2020   LDLCALC 72 12/21/2020   LDLCALC 88 03/28/2018    See  Psychiatric Specialty Exam and Suicide Risk Assessment completed by Attending Physician prior to discharge.  Discharge destination:  Home  Is patient on multiple antipsychotic therapies at discharge:  No   Has Patient had three or more failed trials of antipsychotic monotherapy by history:  No  Recommended Plan for Multiple Antipsychotic Therapies: NA  Discharge Instructions    Diet - low sodium heart healthy   Complete by: As directed    Increase activity slowly   Complete by: As directed      Allergies as of 12/24/2020      Reactions   Fish Allergy Anaphylaxis   Shellfish Allergy Anaphylaxis      Medication List    STOP taking these medications   diazepam 5 MG tablet Commonly known as: VALIUM   gabapentin 300 MG capsule Commonly known as: NEURONTIN   nicotine 14 mg/24hr patch Commonly known as: NICODERM CQ - dosed in mg/24 hours     TAKE these medications     Indication  buPROPion 300 MG 24 hr tablet Commonly known as: WELLBUTRIN XL Take 1 tablet (300 mg total) by mouth daily.  Indication: Major Depressive Disorder   pantoprazole 40 MG tablet Commonly known as: PROTONIX Take 1 tablet (40 mg total) by mouth 2 (two) times daily before a meal. What changed: when to take this  Indication: Heartburn   QUEtiapine 200 MG tablet Commonly known as: SEROQUEL Take 1 tablet (200 mg total) by mouth at bedtime.  Indication: Manic-Depression        Follow-up recommendations:  - Activity as tolerated. - Diet as recommended by PCP. - Keep all scheduled follow-up appointments as recommended.  Comments:  Patient is instructed to take all prescribed medications as recommended. Report any side effects or adverse  reactions to your outpatient psychiatrist. Patient is instructed to abstain from alcohol and illegal drugs while on prescription medications. In the event of worsening symptoms, patient is instructed to call the crisis hotline, 911, or go to the nearest emergency department for evaluation and treatment.  Signed: [name], MD 12/24/2020, 9:42 AM

## 2020-12-24 NOTE — BHH Counselor (Signed)
CSW placed 2 bus passes on pt's chart to be given at d/c.   Toney Reil, [place] Worker [employer]

## 2020-12-24 NOTE — Progress Notes (Signed)
Recreation Therapy Notes  Date:  2.28.22 Time: 0930 Location: 300 Hall Dayroom  Group Topic: Stress Management  Goal Area(s) Addresses:  Patient will identify positive stress management techniques. Patient will identify benefits of using stress management post d/c.  Behavioral Response:  Engaged  Intervention: Stress Management  Activity:  Meditation.  LRT played a meditation from the Calm app that focused on making the most of your day by focusing on something you want to accomplish.  Patients were to listen and follow along meditation played to engage in activity.    Education:  Stress Management, Discharge Planning.   Education Outcome: Acknowledges Education  Clinical Observations/Feedback: Pt attended and participated in activity.    Victorino Sparrow, LRT/CTRS         Victorino Sparrow A 12/24/2020 11:18 AM

## 2020-12-25 ENCOUNTER — Emergency Department (HOSPITAL_COMMUNITY)
Admission: EM | Admit: 2020-12-25 | Discharge: 2020-12-25 | Disposition: A | Discharge status: Home / Self Care | Payer: 59 | Attending: Emergency Medicine | Admitting: Emergency Medicine

## 2020-12-25 ENCOUNTER — Other Ambulatory Visit (HOSPITAL_COMMUNITY): Payer: Self-pay | Admitting: Physician Assistant

## 2020-12-25 ENCOUNTER — Other Ambulatory Visit: Payer: Self-pay

## 2020-12-25 ENCOUNTER — Encounter (HOSPITAL_COMMUNITY): Payer: Self-pay | Admitting: Emergency Medicine

## 2020-12-25 DIAGNOSIS — F17210 Nicotine dependence, cigarettes, uncomplicated: Secondary | ICD-10-CM | POA: Diagnosis not present

## 2020-12-25 DIAGNOSIS — Z79899 Other long term (current) drug therapy: Secondary | ICD-10-CM | POA: Diagnosis not present

## 2020-12-25 DIAGNOSIS — R109 Unspecified abdominal pain: Secondary | ICD-10-CM | POA: Insufficient documentation

## 2020-12-25 DIAGNOSIS — Z20822 Contact with and (suspected) exposure to covid-19: Secondary | ICD-10-CM | POA: Diagnosis not present

## 2020-12-25 DIAGNOSIS — K219 Gastro-esophageal reflux disease without esophagitis: Secondary | ICD-10-CM | POA: Diagnosis not present

## 2020-12-25 DIAGNOSIS — R1110 Vomiting, unspecified: Secondary | ICD-10-CM | POA: Insufficient documentation

## 2020-12-25 LAB — CBC
HCT: 44.9 % (ref 39.0–52.0)
Hemoglobin: 14.1 g/dL (ref 13.0–17.0)
MCH: 28.7 pg (ref 26.0–34.0)
MCHC: 31.4 g/dL (ref 30.0–36.0)
MCV: 91.3 fL (ref 80.0–100.0)
Platelets: 314 10*3/uL (ref 150–400)
RBC: 4.92 MIL/uL (ref 4.22–5.81)
RDW: 13.7 % (ref 11.5–15.5)
WBC: 14 10*3/uL — ABNORMAL HIGH (ref 4.0–10.5)
nRBC: 0 % (ref 0.0–0.2)

## 2020-12-25 LAB — COMPREHENSIVE METABOLIC PANEL
ALT: 36 U/L (ref 0–44)
AST: 33 U/L (ref 15–41)
Albumin: 4.3 g/dL (ref 3.5–5.0)
Alkaline Phosphatase: 72 U/L (ref 38–126)
Anion gap: 11 (ref 5–15)
BUN: 10 mg/dL (ref 6–20)
CO2: 25 mmol/L (ref 22–32)
Calcium: 9.5 mg/dL (ref 8.9–10.3)
Chloride: 101 mmol/L (ref 98–111)
Creatinine, Ser: 1.17 mg/dL (ref 0.61–1.24)
GFR, Estimated: >60 mL/min (ref >60)
Glucose, Bld: 125 mg/dL — ABNORMAL HIGH (ref 70–99)
Potassium: 3.8 mmol/L (ref 3.5–5.1)
Sodium: 137 mmol/L (ref 135–145)
Total Bilirubin: 0.8 mg/dL (ref 0.3–1.2)
Total Protein: 7.8 g/dL (ref 6.5–8.1)

## 2020-12-25 LAB — URINALYSIS, ROUTINE W REFLEX MICROSCOPIC
Bilirubin Urine: NEGATIVE
Glucose, UA: NEGATIVE mg/dL
Hgb urine dipstick: NEGATIVE
Ketones, ur: NEGATIVE mg/dL
Leukocytes,Ua: NEGATIVE
Nitrite: NEGATIVE
Protein, ur: NEGATIVE mg/dL
Specific Gravity, Urine: 1.011 (ref 1.005–1.030)
pH: 6 (ref 5.0–8.0)

## 2020-12-25 LAB — RAPID URINE DRUG SCREEN, HOSP PERFORMED
Amphetamines: NOT DETECTED
Barbiturates: NOT DETECTED
Benzodiazepines: POSITIVE — AB
Cocaine: NOT DETECTED
Opiates: NOT DETECTED
Tetrahydrocannabinol: POSITIVE — AB

## 2020-12-25 LAB — SARS CORONAVIRUS 2 (TAT 6-24 HRS): SARS Coronavirus 2: NEGATIVE

## 2020-12-25 LAB — LIPASE, BLOOD: Lipase: 29 U/L (ref 11–51)

## 2020-12-25 LAB — POC SARS CORONAVIRUS 2 AG -  ED: SARS Coronavirus 2 Ag: NEGATIVE

## 2020-12-25 MED ORDER — ONDANSETRON 4 MG PO TBDP: 4.0000 mg | ORAL_TABLET | Freq: Three times a day (TID) | ORAL | 0 refills | Status: DC | PRN

## 2020-12-25 MED FILL — ONDANSETRON ODT 4 MG TABLET: 4 | 3 days supply | Qty: 10 | Fill #0

## 2020-12-25 NOTE — ED Provider Notes (Signed)
[facility] EMERGENCY DEPARTMENT Provider Note   CSN: [id number] Arrival date & time: 12/25/20  1045     History Chief Complaint  Patient presents with  . Emesis  . Abdominal Pain    Brian [name] is a 35 y.o. male.  The history is provided by the patient. No language interpreter was used.  Emesis Severity:  Moderate Duration:  1 day Timing:  Constant Number of daily episodes:  Multiple Progression:  Worsening Chronicity:  New Recent urination:  Normal Relieved by:  Nothing Ineffective treatments:  None tried Associated symptoms: abdominal pain   Risk factors: no diabetes   Abdominal Pain Associated symptoms: vomiting    Pt reports he just left [facility] yesterday.  Pt reports vomiting today.  Pt reports he smoked marijuana but nothing else.     Past Medical History:  Diagnosis Date  . Alcohol abuse   . Anxiety   . Bipolar 2 disorder ([place])   . Cocaine abuse ([place])   . Depression   . Kidney stones   . Myocardial infarction ([place])   . Narcotic abuse ([place])   . Peptic ulcer   . Polysubstance abuse ([place])   . Renal disorder    kidney stones    Patient Active Problem List   Diagnosis Date Noted  . Bipolar disorder ([place]) 12/21/2020  . Alcohol abuse 12/17/2020  . PUD (peptic ulcer disease) 12/17/2020  . Drug overdose 12/17/2020  . 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 Gastroesophageal reflux disease 10/10/2020  . Bupropion overdose 08/13/2020  . Substance use disorder 08/13/2020  . Suicidal ideation 08/13/2020  . Pericarditis 07/02/2020  . Depression 07/02/2020  . Elevated troponin   . Non-ST elevation (NSTEMI) myocardial infarction ([place])   . Chest pain 06/19/2020  . Psychoactive substance-induced psychosis ([place]) 06/04/2020  . Malingering 06/04/2020  . Paranoia ([place])   . 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 Substance induced mood disorder ([place]) 03/26/2018  . Bipolar II disorder, severe, depressed, with  anxious distress ([place]) 03/25/2018  . Cellulitis 05/20/2015  . Dysuria 05/20/2015  . Smoker 05/20/2015  . Homeless single person 05/20/2015  . Drug abuse ([place])   . Opioid use disorder, severe, dependence ([place]) 03/08/2015  . Ileus of unspecified type ([place]) 05/28/2014  . Abdominal pain 05/28/2014  . AKI (acute kidney injury) ([place]) 05/28/2014  . Esophagitis, acute 05/28/2014  . Polysubstance abuse ([place]) 05/28/2014  . Hematemesis 05/28/2014    Past Surgical History:  Procedure Laterality Date  . ESOPHAGOGASTRODUODENOSCOPY N/A 05/29/2014   Procedure: ESOPHAGOGASTRODUODENOSCOPY (EGD) ;  Surgeon: Beryle Beams, MD;  Location: [facility] ENDOSCOPY;  Service: Endoscopy;  Laterality: N/A;  check with Dr. Collene Mares about sedation type/timinng - I recommend MAC  . HAND SURGERY  2006  . LEFT HEART CATH AND CORONARY ANGIOGRAPHY N/A 06/21/2020   Procedure: LEFT HEART CATH AND CORONARY ANGIOGRAPHY;  Surgeon: Nelva Bush, MD;  Location: [place] CV LAB;  Service: Cardiovascular;  Laterality: N/A;       Family History  Problem Relation Age of Onset  . Heart disease Father     Social History   Tobacco Use  . Smoking status: Current Every Day Smoker    Packs/day: 0.50    Types: Cigarettes  . Smokeless tobacco: Never Used  Vaping Use  . Vaping Use: Never used  Substance Use Topics  . Alcohol use: Not Currently  . Drug use: Not Currently    Types: Marijuana, Cocaine, IV, Methamphetamines    Comment: heroin  and meth    Home Medications Prior to Admission medications   Medication Sig Start Date End Date Taking? Authorizing Provider  buPROPion (WELLBUTRIN XL) 300 MG 24 hr tablet Take 1 tablet (300 mg total) by mouth daily. 12/24/20 01/23/21  [name], MD  pantoprazole (PROTONIX) 40 MG tablet Take 1 tablet (40 mg total) by mouth 2 (two) times daily before a meal. 12/24/20 01/23/21  [name], MD  QUEtiapine (SEROQUEL) 200 MG tablet Take 1 tablet (200 mg total) by mouth at  bedtime. 12/24/20   [name], MD  FLUoxetine (PROZAC) 20 MG capsule Take 1 capsule (20 mg total) by mouth daily. For depression Patient not taking: Reported on 11/04/2019 04/01/18 05/04/20  Lindell Spar I, NP  sucralfate (CARAFATE) 1 g tablet Take 1 tablet (1 g total) by mouth 4 (four) times daily -  with meals and at bedtime. Patient not taking: Reported on 11/04/2019 08/30/19 05/04/20  Joy, Helane Gunther, PA-C  traZODone (DESYREL) 50 MG tablet Take 1 tablet (50 mg total) by mouth at bedtime as needed for sleep. Patient not taking: Reported on 08/30/2019 03/31/18 08/30/19  Lindell Spar I, NP    Allergies    Fish allergy and Shellfish allergy  Review of Systems   Review of Systems  Gastrointestinal: Positive for abdominal pain and vomiting.  All other systems reviewed and are negative.   Physical Exam Updated Vital Signs BP (!) 152/92 (BP Location: Left Arm)   Pulse 99   Temp 98.5 F (36.9 C)   Resp 15   SpO2 99%   Physical Exam Vitals and nursing note reviewed.  Constitutional:      Appearance: He is well-developed and well-nourished.  HENT:     Head: Normocephalic and atraumatic.  Eyes:     Conjunctiva/sclera: Conjunctivae normal.  Cardiovascular:     Rate and Rhythm: Normal rate and regular rhythm.     Heart sounds: Normal heart sounds. No murmur heard.   Pulmonary:     Effort: Pulmonary effort is normal. No respiratory distress.     Breath sounds: Normal breath sounds.  Abdominal:     General: Abdomen is flat.     Palpations: Abdomen is soft.     Tenderness: There is no abdominal tenderness.  Musculoskeletal:        General: No edema.     Cervical back: Neck supple.  Skin:    General: Skin is warm and dry.  Neurological:     General: No focal deficit present.     Mental Status: He is alert.  Psychiatric:        Mood and Affect: Mood and affect normal.     ED Results / Procedures / Treatments   Labs (all labs ordered are listed, but only abnormal results are  displayed) Labs Reviewed  COMPREHENSIVE METABOLIC PANEL - Abnormal; Notable for the following components:      Result Value   Glucose, Bld 125 (*)    All other components within normal limits  CBC - Abnormal; Notable for the following components:   WBC 14.0 (*)    All other components within normal limits  RAPID URINE DRUG SCREEN, HOSP PERFORMED - Abnormal; Notable for the following components:   Benzodiazepines POSITIVE (*)    Tetrahydrocannabinol POSITIVE (*)    All other components within normal limits  SARS CORONAVIRUS 2 (TAT 6-24 HRS)  LIPASE, BLOOD  URINALYSIS, ROUTINE W REFLEX MICROSCOPIC  POC SARS CORONAVIRUS 2 AG -  ED  EKG None  Radiology No results found.  Procedures Procedures   Medications Ordered in ED Medications - No data to display  ED Course  I have reviewed the triage vital signs and the nursing notes.  Pertinent labs & imaging results that were available during my care of the patient were reviewed by me and considered in my medical decision making (see chart for details).    MDM Rules/Calculators/A&P                          MDM:  Labs normal rapid covid is negative, Pt given odt zofran.  Rx for zofran  Final Clinical Impression(s) / ED Diagnoses Final diagnoses:  Vomiting, intractability of vomiting not specified, presence of nausea not specified, unspecified vomiting type    Rx / DC Orders ED Discharge Orders         Ordered    ondansetron (ZOFRAN ODT) 4 MG disintegrating tablet  Every 8 hours PRN        12/25/20 1511        An After Visit Summary was printed and given to the patient.    Fransico Meadow, [place] 12/25/20 1512    Tegeler, Gwenyth Allegra, MD 12/25/20 1556

## 2020-12-25 NOTE — Discharge Instructions (Addendum)
Return if any problems.  Your covid test is pending

## 2020-12-25 NOTE — ED Triage Notes (Signed)
Pt arrives to ED with c/o nausea and vomiting starting at 0730 today. Pt reports >10 episodes. Hx polysubstance abuse. Was admitted this week to [facility] for Wellbutrin overdose, but states has not taken drugs since d/c yesterday. Was prescribed Seroquel but has not taken it.

## 2020-12-27 ENCOUNTER — Ambulatory Visit (HOSPITAL_COMMUNITY)
Admission: EM | Admit: 2020-12-27 | Discharge: 2020-12-27 | Disposition: A | Discharge status: Home / Self Care | Payer: 59

## 2020-12-27 ENCOUNTER — Other Ambulatory Visit: Payer: Self-pay

## 2020-12-27 DIAGNOSIS — F1520 Other stimulant dependence, uncomplicated: Secondary | ICD-10-CM | POA: Diagnosis not present

## 2020-12-27 NOTE — Discharge Instructions (Signed)
Patient is instructed prior to discharge to:  Take all medications as prescribed by his/her mental healthcare provider. Report any adverse effects and or reactions from the medicines to his/her outpatient provider promptly. Keep all scheduled appointments, to ensure that you are getting refills on time and to avoid any interruption in your medication.  If you are unable to keep an appointment call to reschedule.  Be sure to follow-up with resources and follow-up appointments provided.  Patient has been instructed & cautioned: To not engage in alcohol and or illegal drug use while on prescription medicines. In the event of worsening symptoms, patient is instructed to call the crisis hotline, 911 and or go to the nearest ED for appropriate evaluation and treatment of symptoms. To follow-up with his/her primary care provider for your other medical issues, concerns and or health care needs.    Homeless Shelter List:     Regulatory affairs officer ([place])  [place], [place]  Phone: [phone]     [facility]  [address], [place], [place] [address]  Phone: [phone]     [facility] (Women only)  [address]Beryl Meager [place], [place] [address]  Phone: [place]. [place], [place] [address]  Phone: [phone]     [place]:  [phone]. [address]  [place], [place] [address]  Phone: [phone]     [facility]  [address], [place], [place] [address]  (Check in at 6:00PM for placement at a local shelter)  Phone: [phone]   Substance abuse resources and Residential Options:  [facility] day residential substance abuse facility (not an option if you have active assault charges). [address] [place], [place] [address] Phone: [phone]: Ask for Myrlene Broker in admissions to complete intake  if interested in pursuing this option.  [facility]: Can get intake scheduled; (not an option if you have active assault charges). [place] [address] [place], [place] [phone]) Call Mon-Fri.  [facility] ([facility]): (offers outpatient therapy and intensive outpatient substance abuse therapy).  [address], [place], [place] [address] Phone: [phone]  [place]: Offers FREE recovery skills classes, support groups, 1:1 Peer Support, and Compeer Classes. [address] [place], [place] [address] Phone: [phone] (Call to complete intake).   [facility] [place], [place] [address] Phone: [phone] ext [phone]  The [facility] provides food, shelter and other programs and services to the homeless men of [place] through our [employer] program.  By offering safe shelter, three meals a day, clean clothing, Biblical counseling, financial planning, vocational training, GED/education and employment assistance, we've helped mend the shattered lives of many homeless men since opening in 1974.  We have approximately 267 beds available, with a max of 312 beds including mats for emergency situations and currently house an average of 270 men a night.  Prospective Client Check-In Information Photo ID Required (State/ Out of State/ [facility]) - if photo ID is not available, clients are required to have a printout of a police/sheriff's criminal history report. Help out with chores around the [place]. No sex offender of any type (pending, charged, registered and/or any other sex related offenses) will be permitted to check in. Must be willing to abide by all rules, regulations, and policies established by the [employer]. The following will  be provided - shelter, food, clothing, and biblical counseling. If you or someone you know is in need of assistance at our [facility] shelter in [place], [place],  please call [phone] ext. [phone].

## 2020-12-27 NOTE — BH Assessment (Signed)
Patient presented to [facility] reporting symptoms of withdrawal from Methamphetamine . Patient reports he is homeless and was referred by [facility].Patient was discharged from [facility] after 4 days for relapsing on Meth.  Patient advised that he has an appointment at [facility] on Monday but requesting overnight observation so he did not have to sleep outside. Patient reports using 20.00 a day for past 4-5 days. Patient denied SI/HI/ AVH during triage. Patient assessed by provider. Writer contacted [facility]  spoke with Loma Sousa who confirmed open bed for patient to walk in information provided to patient and provider.    [facility] MSE Discharge Disposition for Follow up and Recommendations: Based on my evaluation the patient does not appear to have an emergency medical condition and can be discharged with resources and follow up care in outpatient services for Medication Management and Individual Therapy  Patient reviewed with Dr. Dwyane Dee. Follow-up with outpatient psychiatry. Follow-up with substance use treatment resources provided.  At patient's request, patient transported via safe transport to [facility].

## 2020-12-27 NOTE — ED Provider Notes (Signed)
Behavioral Health Urgent Care Medical Screening Exam  Patient Name: Brian [name] MRN: [id number] Date of Evaluation: 12/27/20 Chief Complaint:   Diagnosis:  Final diagnoses:  Amphetamine use disorder, severe, dependence ([place])    History of Present illness: Brian [name] is a 35 y.o. male.  Patient presents voluntarily to [facility] behavioral health center.  Patient reports he was referred by [facility].  Patient reports he was discharged from his sober living program at [facility] 4 days ago when he relapsed on methamphetamine.  Patient reports recent stressor includes relapsing on methamphetamine approximately 4 days ago.  Patient reports use of $20 per day worth of men amphetamine x4 days.  Patient reports he was recently discharged from [facility] behavioral health approximately 1 week ago.  Patient reports he does have a plan to admit to [facility] rehab in [place] on Monday.  Patient reports he must be drug-free x3 days prior to admittance to this program.  Patient reports he has been diagnosed with bipolar disorder in the past.  Patient reports he is followed by outpatient at [facility] behavioral [facility] center.  Patient reports he does have medication however he has not taken it x4 days.  Patient assessed by nurse practitioner.  Patient alert and oriented, answers appropriately.  Patient denies suicidal and homicidal ideations currently.  Patient denies both auditory and visual hallucinations.  There is no evidence of delusional thought content and no indication that patient is responding to internal stimuli.  Patient denies symptoms of paranoia.  Patient becomes irritable when discussing treatment options including substance use treatment facilities.  Patient states "do I need to go to the ED and get IVC so that I can come here?"  Patient offered assistance with transportation and reaching facility.  Patient reports he is currently homeless in [place] but is connected  with the [facility] for resources.  Patient denies access to weapons.  Patient reports he has recently been employed in [employer] but is currently unemployed.  Patient denies substance use aside from methamphetamine.  Patient offered support and encouragement.  Patient gives verbal consent to contact friend, Erma Pinto phone number [phone] for collateral information.  No answer at this member unable to leave voicemail.   Psychiatric Specialty Exam  Presentation  General Appearance:Appropriate for Environment; Casual  Eye Contact:Good  Speech:Clear and Coherent; Normal Rate  Speech Volume:Normal  Handedness:Right   Mood and Affect  Mood:Anxious  Affect:Appropriate; Congruent   Thought Process  Thought Processes:Coherent; Goal Directed  Descriptions of Associations:Intact  Orientation:Full (Time, Place and Person)  Thought Content:Logical; WDL  Hallucinations:None  Ideas of Reference:None  Suicidal Thoughts:No With Plan  Homicidal Thoughts:No Without Plan   Sensorium  Memory:Immediate Good; Recent Good; Remote Good  Judgment:Good  Insight:Good   Executive Functions  Concentration:Good  Attention Span:Good  [place]  Language:Good   Psychomotor Activity  Psychomotor Activity:Normal   Assets  Assets:Communication Skills; Desire for Improvement; Financial Resources/Insurance; Leisure Time; Physical Health; Resilience; Social Support   Sleep  Sleep:Poor  Number of hours: No data recorded  No data recorded  Physical Exam: Physical Exam Vitals and nursing note reviewed.  Constitutional:      Appearance: He is well-developed.  HENT:     Head: Normocephalic.  Cardiovascular:     Rate and Rhythm: Normal rate.  Pulmonary:     Effort: Pulmonary effort is normal.  Neurological:     Mental Status: He is alert and oriented to person, place, and time.  Psychiatric:        Attention and Perception:  Attention and perception normal.        Mood and Affect: Affect normal. Mood is anxious.        Speech: Speech normal.        Behavior: Behavior normal. Behavior is cooperative.        Thought Content: Thought content normal.        Cognition and Memory: Cognition and memory normal.        Judgment: Judgment normal.    Review of Systems  Constitutional: Negative.   HENT: Negative.   Eyes: Negative.   Respiratory: Negative.   Cardiovascular: Negative.   Gastrointestinal: Negative.   Genitourinary: Negative.   Musculoskeletal: Negative.   Skin: Negative.   Neurological: Negative.   Endo/Heme/Allergies: Negative.   Psychiatric/Behavioral: Positive for substance abuse. The patient is nervous/anxious.    Blood pressure (!) 127/99, pulse 68, temperature 98.6 F (37 C), temperature source Oral, resp. rate 18, height '5\' 10"'$  (1.778 m), weight 152 lb (68.9 kg), SpO2 98 %. Body mass index is 21.81 kg/m.  Musculoskeletal: Strength & Muscle Tone: within normal limits Gait & Station: normal Patient leans: N/A   [place] MSE Discharge Disposition for Follow up and Recommendations: Based on my evaluation the patient does not appear to have an emergency medical condition and can be discharged with resources and follow up care in outpatient services for Medication Management and Individual Therapy  Patient reviewed with Dr. Dwyane Dee. Follow-up with outpatient psychiatry. Follow-up with substance use treatment resources provided.  At patient's request, patient transported via safe transport to [facility].   Emmaline Kluver, [place] 12/27/2020, 5:42 PM

## 2020-12-28 ENCOUNTER — Ambulatory Visit: Payer: Self-pay | Admitting: Internal Medicine

## 2020-12-31 ENCOUNTER — Telehealth (HOSPITAL_COMMUNITY): Payer: Self-pay | Admitting: *Deleted

## 2020-12-31 NOTE — Telephone Encounter (Signed)
Care Management - Follow Up [facility] Discharges   Writer attempted to make contact with patient today and was unsuccessful.  Writer was able to leave a HIPPA compliant voice message and will await callback.   

## 2021-01-02 ENCOUNTER — Other Ambulatory Visit: Payer: Self-pay

## 2021-01-02 ENCOUNTER — Telehealth (HOSPITAL_COMMUNITY): Payer: 59 | Admitting: Physician Assistant

## 2021-01-25 ENCOUNTER — Encounter (HOSPITAL_COMMUNITY): Payer: Self-pay

## 2021-01-25 ENCOUNTER — Emergency Department (HOSPITAL_COMMUNITY)
Admission: EM | Admit: 2021-01-25 | Discharge: 2021-01-25 | Disposition: A | Discharge status: Home / Self Care | Payer: 59 | Attending: Emergency Medicine | Admitting: Emergency Medicine

## 2021-01-25 DIAGNOSIS — B354 Tinea corporis: Secondary | ICD-10-CM | POA: Insufficient documentation

## 2021-01-25 DIAGNOSIS — R21 Rash and other nonspecific skin eruption: Secondary | ICD-10-CM | POA: Diagnosis present

## 2021-01-25 MED ORDER — TERBINAFINE HCL 1 % EX CREA: 1.0000 "application " | TOPICAL_CREAM | Freq: Two times a day (BID) | CUTANEOUS | 1 refills | Status: DC

## 2021-01-25 NOTE — ED Triage Notes (Signed)
Pt presents with c/o rash on his right arm. 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 Rash is in a ring on his forearm.

## 2021-01-25 NOTE — ED Provider Notes (Signed)
  [facility] DEPT Provider Note   CSN: [id number] Arrival date & time: 01/25/21  0944     History Chief Complaint  Patient presents with  . Rash    Brian [name] is a 35 y.o. male.  The history is provided by the patient. No language interpreter was used.  Rash Location:  Shoulder/arm Shoulder/arm rash location:  R forearm Quality: redness   Severity:  Moderate Onset quality:  Gradual Timing:  Constant Chronicity:  New Relieved by:  Nothing Worsened by:  Nothing Associated symptoms: no fever        History reviewed. No pertinent past medical history.  There are no problems to display for this patient.   History reviewed. No pertinent surgical history.     History reviewed. No pertinent family history.     Home Medications Prior to Admission medications   Medication Sig Start Date End Date Taking? Authorizing Provider  terbinafine (LAMISIL AT) 1 % cream Apply 1 application topically 2 (two) times daily. 01/25/21  Yes Fransico Meadow, PA-C    Allergies    Shellfish allergy  Review of Systems   Review of Systems  Constitutional: Negative for fever.  Skin: Positive for rash.  All other systems reviewed and are negative.   Physical Exam Updated Vital Signs BP (!) 137/92 (BP Location: Right Arm)   Pulse (!) 104   Temp 98.4 F (36.9 C) (Oral)   Resp 18   SpO2 99%   Physical Exam Vitals and nursing note reviewed.  Constitutional:      Appearance: He is well-developed.  HENT:     Head: Normocephalic and atraumatic.  Cardiovascular:     Rate and Rhythm: Normal rate.     Heart sounds: No murmur heard.   Pulmonary:     Effort: Pulmonary effort is normal.  Abdominal:     Palpations: Abdomen is soft.  Skin:    General: Skin is warm.     Findings: Lesion present.     Comments: Golf ball size round area right wrist, clearing center  Neurological:     Mental Status: He is alert.     ED Results / Procedures /  Treatments   Labs (all labs ordered are listed, but only abnormal results are displayed) Labs Reviewed - No data to display  EKG None  Radiology No results found.  Procedures Procedures   Medications Ordered in ED Medications - No data to display  ED Course  I have reviewed the triage vital signs and the nursing notes.  Pertinent labs & imaging results that were available during my care of the patient were reviewed by me and considered in my medical decision making (see chart for details).    MDM Rules/Calculators/A&P                          MDM:  Pt counseed on tinea, advised lamiseal or otc produt.   Final Clinical Impression(s) / ED Diagnoses Final diagnoses:  Tinea corporis    Rx / DC Orders ED Discharge Orders         Ordered    terbinafine (LAMISIL AT) 1 % cream  2 times daily        01/25/21 1012        An After Visit Summary was printed and given to the patient.    Sidney Ace 01/25/21 1105    Davonna Belling, MD 01/25/21 1429

## 2021-01-25 NOTE — Discharge Instructions (Signed)
Follow up with your Physician for recheck  

## 2021-01-26 ENCOUNTER — Emergency Department (HOSPITAL_COMMUNITY): Payer: 59

## 2021-01-26 ENCOUNTER — Encounter (HOSPITAL_COMMUNITY): Payer: Self-pay | Admitting: Emergency Medicine

## 2021-01-26 ENCOUNTER — Observation Stay (HOSPITAL_COMMUNITY): Payer: 59

## 2021-01-26 ENCOUNTER — Other Ambulatory Visit: Payer: Self-pay

## 2021-01-26 ENCOUNTER — Observation Stay (HOSPITAL_COMMUNITY)
Admission: EM | Admit: 2021-01-26 | Discharge: 2021-01-27 | Disposition: A | Discharge status: Home / Self Care | Payer: 59 | Attending: Internal Medicine | Admitting: Internal Medicine

## 2021-01-26 DIAGNOSIS — F1910 Other psychoactive substance abuse, uncomplicated: Secondary | ICD-10-CM | POA: Diagnosis not present

## 2021-01-26 DIAGNOSIS — I2510 Atherosclerotic heart disease of native coronary artery without angina pectoris: Secondary | ICD-10-CM | POA: Insufficient documentation

## 2021-01-26 DIAGNOSIS — R2990 Unspecified symptoms and signs involving the nervous system: Secondary | ICD-10-CM

## 2021-01-26 DIAGNOSIS — R531 Weakness: Principal | ICD-10-CM | POA: Insufficient documentation

## 2021-01-26 DIAGNOSIS — E860 Dehydration: Secondary | ICD-10-CM

## 2021-01-26 DIAGNOSIS — Z79899 Other long term (current) drug therapy: Secondary | ICD-10-CM | POA: Diagnosis not present

## 2021-01-26 DIAGNOSIS — F17210 Nicotine dependence, cigarettes, uncomplicated: Secondary | ICD-10-CM | POA: Insufficient documentation

## 2021-01-26 DIAGNOSIS — R1310 Dysphagia, unspecified: Secondary | ICD-10-CM

## 2021-01-26 DIAGNOSIS — G459 Transient cerebral ischemic attack, unspecified: Secondary | ICD-10-CM | POA: Diagnosis present

## 2021-01-26 DIAGNOSIS — Z20822 Contact with and (suspected) exposure to covid-19: Secondary | ICD-10-CM | POA: Diagnosis not present

## 2021-01-26 LAB — CBC WITH DIFFERENTIAL/PLATELET
Abs Immature Granulocytes: 0.02 K/uL (ref 0.00–0.07)
Basophils Absolute: 0 K/uL (ref 0.0–0.1)
Basophils Relative: 0 %
Eosinophils Absolute: 0.4 K/uL (ref 0.0–0.5)
Eosinophils Relative: 4 %
HCT: 41.4 % (ref 39.0–52.0)
Hemoglobin: 13.4 g/dL (ref 13.0–17.0)
Immature Granulocytes: 0 %
Lymphocytes Relative: 22 %
Lymphs Abs: 2 K/uL (ref 0.7–4.0)
MCH: 29.3 pg (ref 26.0–34.0)
MCHC: 32.4 g/dL (ref 30.0–36.0)
MCV: 90.6 fL (ref 80.0–100.0)
Monocytes Absolute: 0.7 K/uL (ref 0.1–1.0)
Monocytes Relative: 8 %
Neutro Abs: 6.1 K/uL (ref 1.7–7.7)
Neutrophils Relative %: 66 %
Platelets: 251 K/uL (ref 150–400)
RBC: 4.57 MIL/uL (ref 4.22–5.81)
RDW: 14.6 % (ref 11.5–15.5)
WBC: 9.2 K/uL (ref 4.0–10.5)
nRBC: 0 % (ref 0.0–0.2)

## 2021-01-26 LAB — RESP PANEL BY RT-PCR (FLU A&B, COVID) ARPGX2
Influenza A by PCR: NEGATIVE
Influenza B by PCR: NEGATIVE
SARS Coronavirus 2 by RT PCR: NEGATIVE

## 2021-01-26 LAB — BASIC METABOLIC PANEL
Anion gap: 7 (ref 5–15)
BUN: 15 mg/dL (ref 6–20)
CO2: 25 mmol/L (ref 22–32)
Calcium: 9.3 mg/dL (ref 8.9–10.3)
Chloride: 106 mmol/L (ref 98–111)
Creatinine, Ser: 1.3 mg/dL — ABNORMAL HIGH (ref 0.61–1.24)
GFR, Estimated: >60 mL/min (ref >60)
Glucose, Bld: 112 mg/dL — ABNORMAL HIGH (ref 70–99)
Potassium: 4.8 mmol/L (ref 3.5–5.1)
Sodium: 138 mmol/L (ref 135–145)

## 2021-01-26 LAB — URINALYSIS, ROUTINE W REFLEX MICROSCOPIC
Bilirubin Urine: NEGATIVE
Glucose, UA: NEGATIVE mg/dL
Hgb urine dipstick: NEGATIVE
Ketones, ur: NEGATIVE mg/dL
Leukocytes,Ua: NEGATIVE
Nitrite: NEGATIVE
Protein, ur: NEGATIVE mg/dL
Specific Gravity, Urine: 1.009 (ref 1.005–1.030)
pH: 6 (ref 5.0–8.0)

## 2021-01-26 LAB — CBC
HCT: 42.4 % (ref 39.0–52.0)
Hemoglobin: 13.9 g/dL (ref 13.0–17.0)
MCH: 29.8 pg (ref 26.0–34.0)
MCHC: 32.8 g/dL (ref 30.0–36.0)
MCV: 90.8 fL (ref 80.0–100.0)
Platelets: 262 10*3/uL (ref 150–400)
RBC: 4.67 MIL/uL (ref 4.22–5.81)
RDW: 14.6 % (ref 11.5–15.5)
WBC: 9.2 10*3/uL (ref 4.0–10.5)
nRBC: 0 % (ref 0.0–0.2)

## 2021-01-26 LAB — I-STAT CHEM 8, ED
BUN: 14 mg/dL (ref 6–20)
Calcium, Ion: 1.22 mmol/L (ref 1.15–1.40)
Chloride: 105 mmol/L (ref 98–111)
Creatinine, Ser: 1.2 mg/dL (ref 0.61–1.24)
Glucose, Bld: 111 mg/dL — ABNORMAL HIGH (ref 70–99)
HCT: 42 % (ref 39.0–52.0)
Hemoglobin: 14.3 g/dL (ref 13.0–17.0)
Potassium: 4.4 mmol/L (ref 3.5–5.1)
Sodium: 140 mmol/L (ref 135–145)
TCO2: 26 mmol/L (ref 22–32)

## 2021-01-26 LAB — RAPID URINE DRUG SCREEN, HOSP PERFORMED
Amphetamines: NOT DETECTED
Barbiturates: NOT DETECTED
Benzodiazepines: NOT DETECTED
Cocaine: NOT DETECTED
Opiates: NOT DETECTED
Tetrahydrocannabinol: NOT DETECTED

## 2021-01-26 LAB — COMPREHENSIVE METABOLIC PANEL
ALT: 27 U/L (ref 0–44)
AST: 24 U/L (ref 15–41)
Albumin: 4.5 g/dL (ref 3.5–5.0)
Alkaline Phosphatase: 77 U/L (ref 38–126)
Anion gap: 8 (ref 5–15)
BUN: 15 mg/dL (ref 6–20)
CO2: 25 mmol/L (ref 22–32)
Calcium: 9.2 mg/dL (ref 8.9–10.3)
Chloride: 105 mmol/L (ref 98–111)
Creatinine, Ser: 1.31 mg/dL — ABNORMAL HIGH (ref 0.61–1.24)
GFR, Estimated: >60 mL/min (ref >60)
Glucose, Bld: 112 mg/dL — ABNORMAL HIGH (ref 70–99)
Potassium: 4.6 mmol/L (ref 3.5–5.1)
Sodium: 138 mmol/L (ref 135–145)
Total Bilirubin: 0.6 mg/dL (ref 0.3–1.2)
Total Protein: 8.2 g/dL — ABNORMAL HIGH (ref 6.5–8.1)

## 2021-01-26 LAB — PROTIME-INR
INR: 0.9 (ref 0.8–1.2)
Prothrombin Time: 11.8 seconds (ref 11.4–15.2)

## 2021-01-26 LAB — APTT: aPTT: 26 seconds (ref 24–36)

## 2021-01-26 LAB — ETHANOL: Alcohol, Ethyl (B): <10 mg/dL (ref <10)

## 2021-01-26 LAB — CBG MONITORING, ED: Glucose-Capillary: 111 mg/dL — ABNORMAL HIGH (ref 70–99)

## 2021-01-26 LAB — TROPONIN I (HIGH SENSITIVITY): Troponin I (High Sensitivity): 3 ng/L

## 2021-01-26 MED ORDER — QUETIAPINE FUMARATE 100 MG PO TABS
200.0000 mg | ORAL_TABLET | Freq: Every day | ORAL | Status: DC
  Administered 2021-01-26: 200 mg via ORAL
  Filled 2021-01-26: qty 2

## 2021-01-26 MED ORDER — ACETAMINOPHEN 160 MG/5ML PO SOLN: 650.0000 mg | ORAL | Status: DC | PRN

## 2021-01-26 MED ORDER — STROKE: EARLY STAGES OF RECOVERY BOOK: Freq: Once | Status: DC

## 2021-01-26 MED ORDER — GADOBUTROL 1 MMOL/ML IV SOLN
7.0000 mL | Freq: Once | INTRAVENOUS | Status: AC | PRN
  Administered 2021-01-26: 7 mL via INTRAVENOUS

## 2021-01-26 MED ORDER — SODIUM CHLORIDE 0.9 % IV SOLN: INTRAVENOUS | Status: DC

## 2021-01-26 MED ORDER — ACETAMINOPHEN 325 MG PO TABS: 650.0000 mg | ORAL_TABLET | ORAL | Status: DC | PRN

## 2021-01-26 MED ORDER — MIRTAZAPINE 15 MG PO TABS
30.0000 mg | ORAL_TABLET | Freq: Every day | ORAL | Status: DC
  Administered 2021-01-26: 30 mg via ORAL
  Filled 2021-01-26: qty 2

## 2021-01-26 MED ORDER — ASPIRIN 81 MG PO CHEW
324.0000 mg | CHEWABLE_TABLET | Freq: Once | ORAL | Status: AC
  Administered 2021-01-26: 324 mg via ORAL
  Filled 2021-01-26: qty 4

## 2021-01-26 MED ORDER — SENNOSIDES-DOCUSATE SODIUM 8.6-50 MG PO TABS: 1.0000 | ORAL_TABLET | Freq: Every evening | ORAL | Status: DC | PRN

## 2021-01-26 MED ORDER — ACETAMINOPHEN 650 MG RE SUPP: 650.0000 mg | RECTAL | Status: DC | PRN

## 2021-01-26 NOTE — ED Provider Notes (Signed)
[facility] DEPT Provider Note   CSN: [id number] Arrival date & time: 01/26/21  1115     History Chief Complaint  Patient presents with  . Weakness    Brian [name] is a 35 y.o. male with reported history of coronary disease, MI status post stent, chronic cocaine and methamphetamine use, presenting to emergency department with weakness and dysphagia.  The patient reports that he noticed around 1 AM this morning that he was having a difficult time swallowing water.  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  Around 10 AM he noted that the left side of his body appeared to be weak, and he had paresthesias diffusely in his body, the left side greater than the right.  He says the sensation is diminished in his left side.  He felt like he could not use his left hand for fine motor skills such as picking up a pencil, which occurred around 10 AM.  He denies chest pain or pressure.  He denies a headache, but he does report a bandlike sensation around his forehead.  He repeatedly denies to me that he is used any drugs in the past 30 days.  He says his last use of methamphetamine was 30 days ago.  He has a distant history of IV drug use but is not on this for many months  HPI     Past Medical History:  Diagnosis Date  . Alcohol abuse   . Anxiety   . Bipolar 2 disorder ([place])   . Cocaine abuse ([place])   . Depression   . Kidney stones   . Myocardial infarction ([place])   . Narcotic abuse ([place])   . Peptic ulcer   . Polysubstance abuse ([place])   . Renal disorder    kidney stones    Patient Active Problem List   Diagnosis Date Noted  . TIA (transient ischemic attack) 01/26/2021  . Bipolar disorder ([place]) 12/21/2020  . Alcohol abuse 12/17/2020  . PUD (peptic ulcer disease) 12/17/2020  . Drug overdose 12/17/2020  . 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[place]) 10/10/2020  . Gastroesophageal reflux disease 10/10/2020  . Bupropion overdose  08/13/2020  . Substance use disorder 08/13/2020  . Suicidal ideation 08/13/2020  . Pericarditis 07/02/2020  . Depression 07/02/2020  . Elevated troponin   . Non-ST elevation (NSTEMI) myocardial infarction ([place])   . Chest pain 06/19/2020  . Psychoactive substance-induced psychosis ([place]) 06/04/2020  . Malingering 06/04/2020  . Paranoia ([place])   . 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 Substance induced mood disorder ([place]) 03/26/2018  . Bipolar II disorder, severe, depressed, with anxious distress ([place]) 03/25/2018  . Cellulitis 05/20/2015  . Dysuria 05/20/2015  . Smoker 05/20/2015  . Homeless single person 05/20/2015  . Drug abuse ([place])   . Opioid use disorder, severe, dependence ([place]) 03/08/2015  . Ileus of unspecified type ([place]) 05/28/2014  . Abdominal pain 05/28/2014  . AKI (acute kidney injury) ([address] [address]) 05/28/2014  . Esophagitis, acute 05/28/2014  . Polysubstance abuse ([place]) 05/28/2014  . Hematemesis 05/28/2014    Past Surgical History:  Procedure Laterality Date  . ESOPHAGOGASTRODUODENOSCOPY N/A 05/29/2014   Procedure: ESOPHAGOGASTRODUODENOSCOPY (EGD) ;  Surgeon: Beryle Beams, MD;  Location: [facility] ENDOSCOPY;  Service: Endoscopy;  Laterality: N/A;  check with Dr. Collene Mares about sedation type/timinng - I recommend MAC  . HAND SURGERY  2006  . LEFT HEART CATH AND CORONARY ANGIOGRAPHY N/A 06/21/2020   Procedure: LEFT HEART CATH AND  CORONARY ANGIOGRAPHY;  Surgeon: Nelva Bush, MD;  Location: [facility];  Service: Cardiovascular;  Laterality: N/A;       Family History  Problem Relation Age of Onset  . Heart disease Father     Social History   Tobacco Use  . Smoking status: Current Every Day Smoker    Packs/day: 0.50    Types: Cigarettes  . Smokeless tobacco: Never Used  Vaping Use  . Vaping Use: Never used  Substance Use Topics  . Alcohol use: Not Currently  . Drug use: Not Currently    Types: Marijuana, Cocaine, IV, Methamphetamines     Comment: heroin and meth    Home Medications Prior to Admission medications   Medication Sig Start Date End Date Taking? Authorizing Provider  ibuprofen (ADVIL) 200 MG tablet Take 200 mg by mouth every 6 (six) hours as needed for headache.   Yes [provider]  mirtazapine (REMERON) 30 MG tablet Take 30 mg by mouth at bedtime.   Yes [provider]  pantoprazole (PROTONIX) 40 MG tablet Take 1 tablet (40 mg total) by mouth 2 (two) times daily before a meal. Patient taking differently: Take 40 mg by mouth 2 (two) times daily. 12/24/20 01/23/21 Yes Pashayan, Redgie Grayer, MD  QUEtiapine (SEROQUEL) 200 MG tablet Take 1 tablet (200 mg total) by mouth at bedtime. 12/24/20  Yes Pashayan, Redgie Grayer, MD  terbinafine (LAMISIL) 1 % cream Apply 1 application topically daily. 01/25/21  Yes [provider]  ondansetron (ZOFRAN ODT) 4 MG disintegrating tablet Take 1 tablet (4 mg total) by mouth every 8 (eight) hours as needed for nausea or vomiting. Patient not taking: No sig reported 12/25/20   Fransico Meadow, PA-C  FLUoxetine (PROZAC) 20 MG capsule Take 1 capsule (20 mg total) by mouth daily. For depression Patient not taking: Reported on 11/04/2019 04/01/18 05/04/20  Lindell Spar I, NP  sucralfate (CARAFATE) 1 g tablet Take 1 tablet (1 g total) by mouth 4 (four) times daily -  with meals and at bedtime. Patient not taking: Reported on 11/04/2019 08/30/19 05/04/20  Joy, Helane Gunther, PA-C  traZODone (DESYREL) 50 MG tablet Take 1 tablet (50 mg total) by mouth at bedtime as needed for sleep. Patient not taking: Reported on 08/30/2019 03/31/18 08/30/19  Lindell Spar I, NP    Allergies    Fish allergy and Shellfish allergy  Review of Systems   Review of Systems  Constitutional: Negative for chills and fever.  Eyes: Negative for pain and visual disturbance.  Respiratory: Negative for cough and shortness of breath.   Cardiovascular: Negative for chest pain and palpitations.  Gastrointestinal: Negative  for abdominal pain, nausea and vomiting.  Genitourinary: Negative for dysuria and hematuria.  Musculoskeletal: Negative for arthralgias and back pain.  Skin: Negative for color change and rash.  Neurological: Positive for facial asymmetry, weakness, light-headedness and numbness. Negative for syncope and headaches.  All other systems reviewed and are negative.   Physical Exam Updated Vital Signs BP (!) 134/95 (BP Location: Right Arm)   Pulse 94   Temp 98.3 F (36.8 C) (Oral)   Resp 16   Ht _0  (1.727 m)   Wt 73.5 kg   SpO2 96%   BMI 24.64 kg/m   Physical Exam Constitutional:      General: He is not in acute distress.    Comments: Slow, stuttering speech  HENT:     Head: Normocephalic and atraumatic.  Eyes:     Extraocular Movements:  Extraocular movements intact.     Conjunctiva/sclera: Conjunctivae normal.     Pupils: Pupils are equal, round, and reactive to light.  Cardiovascular:     Rate and Rhythm: Normal rate and regular rhythm.  Pulmonary:     Effort: Pulmonary effort is normal. No respiratory distress.  Abdominal:     General: There is no distension.     Tenderness: There is no abdominal tenderness.  Skin:    General: Skin is warm and dry.  Neurological:     General: No focal deficit present.     Mental Status: He is alert. Mental status is at baseline.     GCS: GCS eye subscore is 4. GCS verbal subscore is 5. GCS motor subscore is 6.     Coordination: Finger-Nose-Finger Test normal.     Gait: Gait is intact.     Comments: Left sided arm and leg paresthesias 4/5 grip strength in left hand  Psychiatric:        Mood and Affect: Mood normal.        Behavior: Behavior normal.     ED Results / Procedures / Treatments   Labs (all labs ordered are listed, but only abnormal results are displayed) Labs Reviewed  BASIC METABOLIC PANEL - Abnormal; Notable for the following components:      Result Value   Glucose, Bld 112 (*)    Creatinine, Ser 1.30 (*)     All other components within normal limits  COMPREHENSIVE METABOLIC PANEL - Abnormal; Notable for the following components:   Glucose, Bld 112 (*)    Creatinine, Ser 1.31 (*)    Total Protein 8.2 (*)    All other components within normal limits  CBG MONITORING, ED - Abnormal; Notable for the following components:   Glucose-Capillary 111 (*)    All other components within normal limits  I-STAT CHEM 8, ED - Abnormal; Notable for the following components:   Glucose, Bld 111 (*)    All other components within normal limits  RESP PANEL BY RT-PCR (FLU A&B, COVID) ARPGX2  CBC  URINALYSIS, ROUTINE W REFLEX MICROSCOPIC  ETHANOL  PROTIME-INR  APTT  RAPID URINE DRUG SCREEN, HOSP PERFORMED  CBC WITH DIFFERENTIAL/PLATELET  TROPONIN I (HIGH SENSITIVITY)    EKG None  Radiology CT HEAD CODE STROKE WO CONTRAST  Result Date: 01/26/2021 CLINICAL DATA:  Code stroke. Left-sided weakness. Dysphagia. Symptoms began at 9 a.m. today. EXAM: CT HEAD WITHOUT CONTRAST TECHNIQUE: Contiguous axial images were obtained from the base of the skull through the vertex without intravenous contrast. COMPARISON:  CT head without contrast 05/02/2014 FINDINGS: Brain: No acute infarct, hemorrhage, or mass lesion is present. Basal ganglia are within normal limits. Insular ribbon is normal bilaterally. No significant white matter lesions are present. The ventricles are of normal size. No significant extraaxial fluid collection is present. Vascular: No hyperdense vessel or unexpected calcification. Skull: Calvarium is intact. No focal lytic or blastic lesions are present. No significant extracranial soft tissue lesion is present. Sinuses/Orbits: The paranasal sinuses and mastoid air cells are clear. The globes and orbits are within normal limits. ASPECTS [facility] Stroke Program Early CT Score) - Ganglionic level infarction (caudate, lentiform nuclei, internal capsule, insula, M1-M3 cortex): 7/7 - Supraganglionic infarction (M4-M6  cortex): 3/3 Total score (0-10 with 10 being normal): 10/10 IMPRESSION: Negative CT of the head.  Aspects 10/10 These results were called by telephone at the time of interpretation on 01/26/2021 at 12:40 pm to provider Azeem Poorman , who verbally acknowledged these  results. Electronically Signed   By: San Morelle M.D.   On: 01/26/2021 12:41    Procedures Procedures   Medications Ordered in ED Medications  aspirin chewable tablet 324 mg (324 mg Oral Given 01/26/21 1442)    ED Course  I have reviewed the triage vital signs and the nursing notes.  Pertinent labs & imaging results that were available during my care of the patient were reviewed by me and considered in my medical decision making (see chart for details).  This patient complains of dysphagia, weakness, numbness. This involves an extensive number of treatment options, and is a complaint that carries with it a high risk of complications and morbidity.  The differential diagnosis includes CVA vs illicit drug use vs electrolyte derangement vs viral illness vs other  The combination of dysphagia, left hand weakness could be consistent with lacunar infarct.  Given the onset of these left sided paresthesias at 10 am, the patient was activated as a code stroke.  Neurology was consulted, and in consultation we determined that the patient would NOT be a candidate for tPA as noted below.  Medical admission for additional workup including MRI/MRA and echo recommended by neurology, along with full dose aspirin today, then continued 81 mg aspirin daily for now.  I ordered, reviewed, and interpreted labs.  No life-threatening abnormalities were noted on these tests.  Trop 3.   I doubt ACS or aortic dissection.  I ordered imaging studies which included CT head I independently visualized and interpreted imaging which showed no life-threatening abnormalities, and the monitor tracing which showed NSR  After the interventions stated above, I  reevaluated the patient and found no change in clinical presentation, no new or concerning symptoms.  Patient updated regarding plan for admission.   Clinical Course as of 01/26/21 1709  Sat Jan 26, 2021  1224 Tele neuro stroke machine is not functioning, I've paged neurology [MT]  1228 Teleneuro machine functional again, patient at CT, consult in process [MT]  1238 Head CT normal, no acute CVA per neuroradiologist report [MT]  1307 I spoke to Dr. Quinn Axe from neurology who recommends medical admission for MRI brain, MRA head and neck, echo with bubble study, given his hx of vascular disease.  Patient is NOT a candidate for tPA as this is not consistent with an LVO.  We'll complete more of his metabolic workup in the ED and then page for admission. [MT]  1519 Signed out to hospitalist DR Marylyn Ishihara [MT]    Clinical Course User Index [MT] Wyvonnia Dusky, MD    Final Clinical Impression(s) / ED Diagnoses Final diagnoses:  Left-sided weakness    Rx / DC Orders ED Discharge Orders    None       Langston Masker Carola Rhine, MD 01/26/21 [phone]

## 2021-01-26 NOTE — ED Triage Notes (Signed)
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 Reports tingling to bilateral feet and bilateral blurred vision. Denies chest pain and SOB. Denies head injury and LOC. Ambulatory.

## 2021-01-26 NOTE — ED Notes (Signed)
Following ED provider's assessment, code stroke called. TeleNeurologist monitor at the bedside.

## 2021-01-26 NOTE — ED Notes (Signed)
Pt back from CT

## 2021-01-26 NOTE — ED Notes (Signed)
Teleneurologist assessment by Dr. Quinn Axe, complete at this time and head CT reviewed. Per Dr. Quinn Axe, following pt assessment, pt's last known normal was 01/26/21 at 0100am. NIH stroke scale of 1 per Neurologist d/t drift in the pt's left upper extremity. Following Dr. Artemio Aly review of head CT, no acute intracranial process identified and CTA not indicated at this time d/t no concern for large vessel occlusion. TPA not indicated at this time d/t pt's last known normal outside of thrombolytic  timeframe and following assessment. Dr. Quinn Axe to relay above findings with ED provider, Dr. Langston Masker.

## 2021-01-26 NOTE — ED Notes (Signed)
ED provider at bedside to evaluate.

## 2021-01-26 NOTE — ED Notes (Signed)
Teleneurolo assessment in progress.

## 2021-01-26 NOTE — H&P (Signed)
History and Physical    Brian [name] RWE:[id number] DOB: 12-01-1985 DOA: 01/26/2021  PCP: Marliss Coots, NP  Patient coming from: Home  Chief Complaint: generalized weakness, paresthesias   HPI: Brian [name] is a 35 y.o. male with medical history significant of polysubstance abuse, bipolar disorder, CAD. Presenting with generalized weakness and paresthesias. He reports that he was in his normal state of health until this morning. 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 He felt weak in his legs and arms. He became disoriented and it seemed like his body would not follow his commands. He states he didn't fall, but he felt like he was "walking like a drunk." He sat down and tried to collect himself. He was frightened and decided to come to the ED.   ED Course: CTH was negative. Teleneurology was consulted. They recommended TIA w/u. UDS was negative. TRH was called for admission.   Review of Systems:  Denies CP, dyspnea, palpitations, N/V/D, fever. Reports HA. Review of systems is otherwise negative for all not mentioned in HPI.   PMHx Past Medical History:  Diagnosis Date  . Alcohol abuse   . Anxiety   . Bipolar 2 disorder ([place])   . Cocaine abuse ([place])   . Depression   . Kidney stones   . Myocardial infarction ([place])   . Narcotic abuse ([place])   . Peptic ulcer   . Polysubstance abuse ([place])   . Renal disorder    kidney stones    PSHx Past Surgical History:  Procedure Laterality Date  . ESOPHAGOGASTRODUODENOSCOPY N/A 05/29/2014   Procedure: ESOPHAGOGASTRODUODENOSCOPY (EGD) ;  Surgeon: Beryle Beams, MD;  Location: [facility] ENDOSCOPY;  Service: Endoscopy;  Laterality: N/A;  check with Dr. Collene Mares about sedation type/timinng - I recommend MAC  . HAND SURGERY  2006  . LEFT HEART CATH AND CORONARY ANGIOGRAPHY N/A 06/21/2020   Procedure: LEFT HEART CATH AND CORONARY ANGIOGRAPHY;  Surgeon: Nelva Bush, MD;  Location: [place] [facility];  Service: Cardiovascular;  Laterality: N/A;    SocHx  reports that he has been smoking cigarettes. He has been smoking about 0.50 packs per day. He has never used smokeless tobacco. He reports previous alcohol use. He reports previous drug use. Drugs: Marijuana, Cocaine, IV, and Methamphetamines.  Allergies  Allergen Reactions  . Fish Allergy Anaphylaxis  . Shellfish Allergy Anaphylaxis    FamHx Family History  Problem Relation Age of Onset  . Heart disease Father     Prior to Admission medications   Medication Sig Start Date End Date Taking? Authorizing Provider  buPROPion (WELLBUTRIN XL) 300 MG 24 hr tablet Take 1 tablet (300 mg total) by mouth daily. 12/24/20 01/23/21  [name], MD  ondansetron (ZOFRAN ODT) 4 MG disintegrating tablet Take 1 tablet (4 mg total) by mouth every 8 (eight) hours as needed for nausea or vomiting. 12/25/20   Fransico Meadow, PA-C  pantoprazole (PROTONIX) 40 MG tablet Take 1 tablet (40 mg total) by mouth 2 (two) times daily before a meal. 12/24/20 01/23/21  [name], MD  QUEtiapine (SEROQUEL) 200 MG tablet Take 1 tablet (200 mg total) by mouth at bedtime. 12/24/20   [name], MD  FLUoxetine (PROZAC) 20 MG capsule Take 1 capsule (20 mg total) by mouth daily. For depression Patient not taking: Reported on 11/04/2019 04/01/18 05/04/20  Encarnacion Slates, NP  sucralfate (  CARAFATE) 1 g tablet Take 1 tablet (1 g total) by mouth 4 (four) times daily -  with meals and at bedtime. Patient not taking: Reported on 11/04/2019 08/30/19 05/04/20  Joy, Helane Gunther, PA-C  traZODone (DESYREL) 50 MG tablet Take 1 tablet (50 mg total) by mouth at bedtime as needed for sleep. Patient not taking: Reported on 08/30/2019 03/31/18 08/30/19  Lindell Spar I, NP    Physical Exam: Vitals:   01/26/21 1302 01/26/21 1316 01/26/21 1440 01/26/21 1441  BP: (!) 141/91 (!) 127/91 (!) 134/95 (!) 134/95  Pulse: 92 93 92 94  Resp:  _0 Temp:    98.3 F (36.8 C)  TempSrc:    Oral  SpO2: 97% 97% 98% 96%  Weight:      Height:        General: 35 y.o. male resting in bed in NAD Eyes: PERRL, normal sclera ENMT: Nares patent w/o discharge, orophaynx clear, dentition normal, ears w/o discharge/lesions/ulcers Neck: Supple, trachea midline Cardiovascular: RRR, +S1, S2, no m/g/r, equal pulses throughout Respiratory: CTABL, no w/r/r, normal WOB GI: BS+, NDNT, no masses noted, no organomegaly noted MSK: No e/c/c Skin: No rashes, bruises, ulcerations noted Neuro: A&O x 3, LUE/LLE slightly weaker than opposing side, decreased sensation in V1, V3 Psyc: hyper, pressured speech, hard to focus, cooperative however  Labs on Admission: I have personally reviewed following labs and imaging studies  CBC: Recent Labs  Lab 01/26/21 1214 01/26/21 1253  WBC 9.2  --   HGB 13.9 14.3  HCT 42.4 42.0  MCV 90.8  --   PLT 262  --    Basic Metabolic Panel: Recent Labs  Lab 01/26/21 1214 01/26/21 1253  NA 138 140  K 4.8 4.4  CL 106 105  CO2 25  --   GLUCOSE 112* 111*  BUN 15 14  CREATININE 1.30* 1.20  CALCIUM 9.3  --    GFR: Estimated Creatinine Clearance: 82.3 mL/min (by C-G formula based on SCr of 1.2 mg/dL). Liver Function Tests: No results for input(s): AST, ALT, ALKPHOS, BILITOT, PROT, ALBUMIN in the last 168 hours. No results for input(s): LIPASE, AMYLASE in the last 168 hours. No results for input(s): AMMONIA in the last 168 hours. Coagulation Profile: No results for input(s): INR, PROTIME in the last 168 hours. Cardiac Enzymes: No results for input(s): CKTOTAL, CKMB, CKMBINDEX, TROPONINI in the last 168 hours. BNP (last 3 results) No results for input(s): PROBNP in the last 8760 hours. HbA1C: No results for input(s): HGBA1C in the last 72 hours. CBG: Recent Labs  Lab 01/26/21 1157  GLUCAP 111*   Lipid Profile: No results for input(s): CHOL, HDL, LDLCALC, TRIG, CHOLHDL, LDLDIRECT in the last 72  hours. Thyroid Function Tests: No results for input(s): TSH, T4TOTAL, FREET4, T3FREE, THYROIDAB in the last 72 hours. Anemia Panel: No results for input(s): VITAMINB12, FOLATE, FERRITIN, TIBC, IRON, RETICCTPCT in the last 72 hours. Urine analysis:    Component Value Date/Time   COLORURINE YELLOW 01/26/2021 1211   APPEARANCEUR CLEAR 01/26/2021 1211   LABSPEC 1.009 01/26/2021 1211   PHURINE 6.0 01/26/2021 1211   GLUCOSEU NEGATIVE 01/26/2021 1211   HGBUR NEGATIVE 01/26/2021 1211   BILIRUBINUR NEGATIVE 01/26/2021 1211   KETONESUR NEGATIVE 01/26/2021 1211   PROTEINUR NEGATIVE 01/26/2021 1211   UROBILINOGEN 0.2 05/21/2015 0116   NITRITE NEGATIVE 01/26/2021 1211   LEUKOCYTESUR NEGATIVE 01/26/2021 1211    Radiological Exams on Admission: CT HEAD CODE STROKE WO CONTRAST  Result Date: 01/26/2021 CLINICAL DATA:  Code stroke. Left-sided weakness. Dysphagia. Symptoms began at 9 a.m. today. EXAM: CT HEAD WITHOUT CONTRAST TECHNIQUE: Contiguous axial images were obtained from the base of the skull through the vertex without intravenous contrast. COMPARISON:  CT head without contrast 05/02/2014 FINDINGS: Brain: No acute infarct, hemorrhage, or mass lesion is present. Basal ganglia are within normal limits. Insular ribbon is normal bilaterally. No significant white matter lesions are present. The ventricles are of normal size. No significant extraaxial fluid collection is present. Vascular: No hyperdense vessel or unexpected calcification. Skull: Calvarium is intact. No focal lytic or blastic lesions are present. No significant extracranial soft tissue lesion is present. Sinuses/Orbits: The paranasal sinuses and mastoid air cells are clear. The globes and orbits are within normal limits. ASPECTS [facility] Stroke Program Early CT Score) - Ganglionic level infarction (caudate, lentiform nuclei, internal capsule, insula, M1-M3 cortex): 7/7 - Supraganglionic infarction (M4-M6 cortex): 3/3 Total score (0-10 with 10  being normal): 10/10 IMPRESSION: Negative CT of the head.  Aspects 10/10 These results were called by telephone at the time of interpretation on 01/26/2021 at 12:40 pm to provider MATTHEW TRIFAN , who verbally acknowledged these results. Electronically Signed   By: San Morelle M.D.   On: 01/26/2021 12:41    Assessment/Plan Left sided weakness     - place in obs, tele     - TIA vs CVA vs other     - CTH negative     - MRI brain, MRA H&N, TEE pending     - check A1c, lipids, EKG     - PT/OT/SLP     - teleneuro has evaluated; see note for recs, if MRI positive, formally consult neuro at [facility]     - Full dose ASA today; start ASA 15m tomorrow; start lipitor  Bipolar d/o     - continue home regimen  GERD     - protonix  DVT prophylaxis: SCDs  Code Status: FULL  Family Communication: None at bedside  Consults called: None   Status is: Observation  The patient remains OBS appropriate and will d/c before 2 midnights.  Dispo: The patient is from: Home              Anticipated d/c is to: Home              Patient currently is not medically stable to d/c.   Difficult to place patient No  Time spent coordinating admission: 60 minutes  [facility]  If 7PM-7AM, please contact night-coverage [email or web address]  01/26/2021, 3:38 PM

## 2021-01-26 NOTE — ED Notes (Signed)
Rpt cbc and ethanol level sent to lab at this time.

## 2021-01-26 NOTE — ED Notes (Signed)
Pt to CT at this time.

## 2021-01-26 NOTE — ED Notes (Signed)
BG-[id number].

## 2021-01-26 NOTE — ED Notes (Signed)
Teleneurologist assessing

## 2021-01-26 NOTE — ED Notes (Signed)
Pt ambulatory to exam room. A&o x4. Pt is hypertensive. Pt with weakness in bilateral lower extremities c/o difference in sensation between the right and left side of his body. BG obtained is 111. ED Provider notified.

## 2021-01-26 NOTE — ED Notes (Signed)
ED TO INPATIENT HANDOFF REPORT  Name/Age/Gender Brian [name] 35 y.o. male  Code Status Code Status History    Date Active Date Inactive Code Status Order ID Comments User Context   12/21/2020 1334 12/24/2020 1623 Full Code [id number]  Sharma Covert, MD Inpatient   12/17/2020 0051 12/21/2020 1206 Full Code [id number]  Shela Leff, MD ED   10/14/2020 1823 10/15/2020 1618 Full Code [id number]  Derrill Center, NP ED   08/13/2020 2333 08/16/2020 1636 Full Code [id number]  Lenore Cordia, MD ED   08/13/2020 1637 08/13/2020 2333 Full Code [id number]  Delia Heady, PA-C ED   08/09/2020 2349 08/11/2020 0130 Full Code [id number]  Ripley Fraise, MD ED   07/02/2020 0956 07/04/2020 2042 Full Code [id number]  Harold Hedge, MD ED   06/19/2020 1031 06/21/2020 2209 Full Code [id number]  Jean Rosenthal, MD ED   06/04/2020 1122 06/04/2020 1939 Full Code [id number]  Rankin, Mercy Moore, NP ED   06/03/2020 1441 06/04/2020 1101 Full Code [id number]  Daleen Bo, MD ED   05/02/2020 1500 05/02/2020 1627 Full Code [id number]  Breck Coons, MD ED   05/01/2020 1555 05/02/2020 0913 Full Code [id number]  Lorin Glass, PA-C ED   03/25/2018 1846 04/01/2018 1717 Full Code [id number]  Rankin, Mercy Moore, NP Inpatient   03/25/2018 0217 03/25/2018 1623 Full Code [id number]  Antonietta Breach, PA-C ED   05/20/2015 1154 05/21/2015 1543 Full Code [id number]  Bethena Roys, MD Inpatient   05/28/2014 0119 05/29/2014 1833 Full Code [id number]  Theressa Millard, MD Inpatient   04/04/2013 1457 04/05/2013 0050 Full Code [id number]  Hardin Negus ED   Advance Care Planning Activity    Questions for Most Recent Historical Code Status (Order [id number])       Home/SNF/Other Home  Chief Complaint TIA (transient ischemic attack) [G45.9]  Level of Care/Admitting Diagnosis ED Disposition    ED Disposition Condition [place]: [place] [id number]  Level of Care: Telemetry [5]  Admit to tele based on  following criteria: Monitor for Ischemic changes  Covid Evaluation: Asymptomatic Screening Protocol (No Symptoms)  Diagnosis: TIA (transient ischemic attack) [id number]  Admitting Physician: Jonnie Finner [id number]  Attending Physician: Jonnie Finner [id number]       Medical History Past Medical History:  Diagnosis Date  . Alcohol abuse   . Anxiety   . Bipolar 2 disorder ([place])   . Cocaine abuse ([place])   . Depression   . Kidney stones   . Myocardial infarction ([place])   . Narcotic abuse ([place])   . Peptic ulcer   . Polysubstance abuse ([place])   . Renal disorder    kidney stones    Allergies Allergies  Allergen Reactions  . Fish Allergy Anaphylaxis  . Shellfish Allergy Anaphylaxis    IV Location/Drains/Wounds Patient Lines/Drains/Airways Status    Active Line/Drains/Airways    None          Labs/Imaging Results for orders placed or performed during the hospital encounter of 01/26/21 (from the past 48 hour(s))  CBG monitoring, ED     Status: Abnormal   Collection Time: 01/26/21 11:57 AM  Result Value Ref Range   Glucose-Capillary 111 (H) 70 - 99 mg/dL    Comment: Glucose reference range applies only to samples taken after fasting for at least 8 hours.  Urinalysis, Routine w reflex microscopic     Status: None   Collection Time: 01/26/21 12:11 PM  Result Value  Ref Range   Color, Urine YELLOW YELLOW   APPearance CLEAR CLEAR   Specific Gravity, Urine 1.009 1.005 - 1.030   pH 6.0 5.0 - 8.0   Glucose, UA NEGATIVE NEGATIVE mg/dL   Hgb urine dipstick NEGATIVE NEGATIVE   Bilirubin Urine NEGATIVE NEGATIVE   Ketones, ur NEGATIVE NEGATIVE mg/dL   Protein, ur NEGATIVE NEGATIVE mg/dL   Nitrite NEGATIVE NEGATIVE   Leukocytes,Ua NEGATIVE NEGATIVE    Comment: Performed at [facility] [address] [place], [place] [address]  Urine rapid drug screen (hosp performed)     Status: None   Collection Time: 01/26/21 12:11 PM  Result Value Ref Range   Opiates  NONE DETECTED NONE DETECTED   Cocaine NONE DETECTED NONE DETECTED   Benzodiazepines NONE DETECTED NONE DETECTED   Amphetamines NONE DETECTED NONE DETECTED   Tetrahydrocannabinol NONE DETECTED NONE DETECTED   Barbiturates NONE DETECTED NONE DETECTED    Comment: (NOTE) DRUG SCREEN FOR MEDICAL PURPOSES ONLY.  IF CONFIRMATION IS NEEDED FOR ANY PURPOSE, NOTIFY LAB WITHIN 5 DAYS.  LOWEST DETECTABLE LIMITS FOR URINE DRUG SCREEN Drug Class                     Cutoff (ng/mL) Amphetamine and metabolites    1000 Barbiturate and metabolites    200 Benzodiazepine                 [id number] Tricyclics and metabolites     300 Opiates and metabolites        300 Cocaine and metabolites        300 THC                            50 Performed at [facility], [place] [address], [place], [place] [id number]   Basic metabolic panel     Status: Abnormal   Collection Time: 01/26/21 12:14 PM  Result Value Ref Range   Sodium 138 135 - 145 mmol/L   Potassium 4.8 3.5 - 5.1 mmol/L   Chloride 106 98 - 111 mmol/L   CO2 25 22 - 32 mmol/L   Glucose, Bld 112 (H) 70 - 99 mg/dL    Comment: Glucose reference range applies only to samples taken after fasting for at least 8 hours.   BUN 15 6 - 20 mg/dL   Creatinine, Ser 1.30 (H) 0.61 - 1.24 mg/dL   Calcium 9.3 8.9 - 10.3 mg/dL   GFR, Estimated >60 >60 mL/min    Comment: (NOTE) Calculated using the CKD-EPI Creatinine Equation (2021)    Anion gap 7 5 - 15    Comment: Performed at [facility], [place] [address], [place], [place] [address]  CBC     Status: None   Collection Time: 01/26/21 12:14 PM  Result Value Ref Range   WBC 9.2 4.0 - 10.5 K/uL   RBC 4.67 4.22 - 5.81 MIL/uL   Hemoglobin 13.9 13.0 - 17.0 g/dL   HCT 42.4 39.0 - 52.0 %   MCV 90.8 80.0 - 100.0 fL   MCH 29.8 26.0 - 34.0 pg   MCHC 32.8 30.0 - 36.0 g/dL   RDW 14.6 11.5 - 15.5 %   Platelets 262 150 - 400 K/uL   nRBC 0.0 0.0 - 0.2 %    Comment: Performed at [facility], [place] [address], [place], [place] [address]  Resp Panel by RT-PCR (Flu A&B, Covid)  Status: None   Collection Time: 01/26/21 12:20 PM   Specimen: Nasopharyngeal(NP) swabs in vial transport medium  Result Value Ref Range   SARS Coronavirus 2 by RT PCR NEGATIVE NEGATIVE    Comment: (NOTE) SARS-CoV-2 target nucleic acids are NOT DETECTED.  The SARS-CoV-2 RNA is generally detectable in upper respiratory specimens during the acute phase of infection. The lowest concentration of SARS-CoV-2 viral copies this assay can detect is 138 copies/mL. A negative result does not preclude SARS-Cov-2 infection and should not be used as the sole basis for treatment or other patient management decisions. A negative result may occur with  improper specimen collection/handling, submission of specimen other than nasopharyngeal swab, presence of viral mutation(s) within the areas targeted by this assay, and inadequate number of viral copies(<138 copies/mL). A negative result must be combined with clinical observations, patient history, and epidemiological information. The expected result is Negative.  Fact Sheet for Patients:  EntrepreneurPulse.com.au  Fact Sheet for Healthcare Providers:  IncredibleEmployment.be  This test is no t yet approved or cleared by the [place] FDA and  has been authorized for detection and/or diagnosis of SARS-CoV-2 by FDA under an Emergency Use Authorization (EUA). This EUA will remain  in effect (meaning this test can be used) for the duration of the COVID-19 declaration under Section 564(b)(1) of the Act, 21 U.S.C.section 360bbb-3(b)(1), unless the authorization is terminated  or revoked sooner.       Influenza A by PCR NEGATIVE NEGATIVE   Influenza B by PCR NEGATIVE NEGATIVE    Comment: (NOTE) The Xpert Xpress SARS-CoV-2/FLU/RSV plus assay is intended as an aid in the diagnosis of influenza from  Nasopharyngeal swab specimens and should not be used as a sole basis for treatment. Nasal washings and aspirates are unacceptable for Xpert Xpress SARS-CoV-2/FLU/RSV testing.  Fact Sheet for Patients: EntrepreneurPulse.com.au  Fact Sheet for Healthcare Providers: IncredibleEmployment.be  This test is not yet approved or cleared by the [place] FDA and has been authorized for detection and/or diagnosis of SARS-CoV-2 by FDA under an Emergency Use Authorization (EUA). This EUA will remain in effect (meaning this test can be used) for the duration of the COVID-19 declaration under Section 564(b)(1) of the Act, 21 U.S.C. section 360bbb-3(b)(1), unless the authorization is terminated or revoked.  Performed at [facility], [place] [address] [place], [place] [address]   Protime-INR     Status: None   Collection Time: 01/26/21 12:20 PM  Result Value Ref Range   Prothrombin Time 11.8 11.4 - 15.2 seconds   INR 0.9 0.8 - 1.2    Comment: (NOTE) INR goal varies based on device and disease states. Performed at [facility], [place] [address], [place], [place] [address]   APTT     Status: None   Collection Time: 01/26/21 12:20 PM  Result Value Ref Range   aPTT 26 24 - 36 seconds    Comment: Performed at [facility], [place] [address] [place], [place] [address]  Comprehensive metabolic panel     Status: Abnormal   Collection Time: 01/26/21 12:20 PM  Result Value Ref Range   Sodium 138 135 - 145 mmol/L   Potassium 4.6 3.5 - 5.1 mmol/L   Chloride 105 98 - 111 mmol/L   CO2 25 22 - 32 mmol/L   Glucose, Bld 112 (H) 70 - 99 mg/dL    Comment: Glucose reference range applies only to samples taken after fasting for at least 8 hours.   BUN  15 6 - 20 mg/dL   Creatinine, Ser 1.31 (H) 0.61 - 1.24 mg/dL   Calcium 9.2 8.9 - 10.3 mg/dL   Total Protein 8.2 (H) 6.5 - 8.1 g/dL   Albumin 4.5 3.5 - 5.0 g/dL   AST  24 15 - 41 U/L   ALT 27 0 - 44 U/L   Alkaline Phosphatase 77 38 - 126 U/L   Total Bilirubin 0.6 0.3 - 1.2 mg/dL   GFR, Estimated >60 >60 mL/min    Comment: (NOTE) Calculated using the CKD-EPI Creatinine Equation (2021)    Anion gap 8 5 - 15    Comment: Performed at [facility], [place] [address], [place], [place] [address]  Ginger Carne 8, ED     Status: Abnormal   Collection Time: 01/26/21 12:53 PM  Result Value Ref Range   Sodium 140 135 - 145 mmol/L   Potassium 4.4 3.5 - 5.1 mmol/L   Chloride 105 98 - 111 mmol/L   BUN 14 6 - 20 mg/dL   Creatinine, Ser 1.20 0.61 - 1.24 mg/dL   Glucose, Bld 111 (H) 70 - 99 mg/dL    Comment: Glucose reference range applies only to samples taken after fasting for at least 8 hours.   Calcium, Ion 1.22 1.15 - 1.40 mmol/L   TCO2 26 22 - 32 mmol/L   Hemoglobin 14.3 13.0 - 17.0 g/dL   HCT 42.0 39.0 - 52.0 %  Troponin I (High Sensitivity)     Status: None   Collection Time: 01/26/21  2:47 PM  Result Value Ref Range   Troponin I (High Sensitivity) 3 <18 ng/L    Comment: (NOTE) Elevated high sensitivity troponin I (hsTnI) values and significant  changes across serial measurements may suggest ACS but many other  chronic and acute conditions are known to elevate hsTnI results.  Refer to the "Links" section for chest pain algorithms and additional  guidance. Performed at [facility], [place] [address], [place], [place] [address]   Ethanol     Status: None   Collection Time: 01/26/21  3:32 PM  Result Value Ref Range   Alcohol, Ethyl (B) <10 <10 mg/dL    Comment: (NOTE) Lowest detectable limit for serum alcohol is 10 mg/dL.  For medical purposes only. Performed at [facility], [place] [address] [place],  [address]   CBC with Differential/Platelet     Status: None   Collection Time: 01/26/21  3:32 PM  Result Value Ref Range   WBC 9.2 4.0 - 10.5 K/uL   RBC 4.57 4.22 - 5.81 MIL/uL    Hemoglobin 13.4 13.0 - 17.0 g/dL   HCT 41.4 39.0 - 52.0 %   MCV 90.6 80.0 - 100.0 fL   MCH 29.3 26.0 - 34.0 pg   MCHC 32.4 30.0 - 36.0 g/dL   RDW 14.6 11.5 - 15.5 %   Platelets 251 150 - 400 K/uL   nRBC 0.0 0.0 - 0.2 %   Neutrophils Relative % 66 %   Neutro Abs 6.1 1.7 - 7.7 K/uL   Lymphocytes Relative 22 %   Lymphs Abs 2.0 0.7 - 4.0 K/uL   Monocytes Relative 8 %   Monocytes Absolute 0.7 0.1 - 1.0 K/uL   Eosinophils Relative 4 %   Eosinophils Absolute 0.4 0.0 - 0.5 K/uL   Basophils Relative 0 %   Basophils Absolute 0.0 0.0 - 0.1 K/uL   Immature Granulocytes 0 %   Abs Immature Granulocytes 0.02 0.00 -  0.07 K/uL    Comment: Performed at [facility], [place] [address], [place], [place] [address]   CT HEAD CODE STROKE WO CONTRAST  Result Date: 01/26/2021 CLINICAL DATA:  Code stroke. Left-sided weakness. Dysphagia. Symptoms began at 9 a.m. today. EXAM: CT HEAD WITHOUT CONTRAST TECHNIQUE: Contiguous axial images were obtained from the base of the skull through the vertex without intravenous contrast. COMPARISON:  CT head without contrast 05/02/2014 FINDINGS: Brain: No acute infarct, hemorrhage, or mass lesion is present. Basal ganglia are within normal limits. Insular ribbon is normal bilaterally. No significant white matter lesions are present. The ventricles are of normal size. No significant extraaxial fluid collection is present. Vascular: No hyperdense vessel or unexpected calcification. Skull: Calvarium is intact. No focal lytic or blastic lesions are present. No significant extracranial soft tissue lesion is present. Sinuses/Orbits: The paranasal sinuses and mastoid air cells are clear. The globes and orbits are within normal limits. ASPECTS [facility] Stroke Program Early CT Score) - Ganglionic level infarction (caudate, lentiform nuclei, internal capsule, insula, M1-M3 cortex): 7/7 - Supraganglionic infarction (M4-M6 cortex): 3/3 Total score (0-10 with 10 being normal): 10/10  IMPRESSION: Negative CT of the head.  Aspects 10/10 These results were called by telephone at the time of interpretation on 01/26/2021 at 12:40 pm to provider MATTHEW TRIFAN , who verbally acknowledged these results. Electronically Signed   By: [place] Morelle M.D.   On: 01/26/2021 12:41    Pending Labs Unresulted Labs (From admission, onward)         None      Vitals/Pain Today's Vitals   01/26/21 1302 01/26/21 1316 01/26/21 1440 01/26/21 1441  BP: (!) 141/91 (!) 127/91 (!) 134/95 (!) 134/95  Pulse: 92 93 92 94  Resp: '13 13 13 16  '$ Temp:    98.3 F (36.8 C)  TempSrc:    Oral  SpO2: 97% 97% 98% 96%  Weight:      Height:        Isolation Precautions No active isolations  Medications Medications  aspirin chewable tablet 324 mg (324 mg Oral Given 01/26/21 1442)    Mobility walks

## 2021-01-26 NOTE — Consult Note (Addendum)
Addendum: d/w Dr. Langston Masker who is considering transfer to [facility]. If patient is transferred admitting team will consult neurology to follow.  NEUROLOGY CONSULTATION NOTE   Date of service: January 26, 2021 Patient Name: Brian [name] MRN:  [id number] DOB:  1986-08-22 Reason for consult: TELESTROKE for L sided weakness since 0900 _ _ _   _ __   _ __ _ _  __ __   _ __   __ _  History of Present Illness    This is a 35 yo man with hx polysubstance abuse (cocaine, meth, narcotics, EtOH), prior MI, bipolar II disorder who presented to [facility] ED after acute onset L sided weakness at 0900.   LKW 0100 this AM when he woke up and was unable to swallow without choking when he drank water. 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 Reports incoordination of L hand as well as sensory impairment (first said was more pronounced on L, then reported that it alternates sides).  Pt is a very difficult historian, tangential with pressured speech, who required continuous redirection from myself and the bedside RN to participate in the telestroke consult. 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 Bandlike headache. No chest pain, SOB, dizziness.   CTH NAICP, ASPECTS 10 (personally reviewed) tPA not administered bc patient presented outside of the window.  NIHSS = 1 for LUE drift with no other deficits.  CTA performed 2/2 exam not consistent with LVO and would not be interventional candidate due to low NIHSS.   ROS   10 point review of systems was performed and was negative except as described in HPI.  Past History   Past Medical History:  Diagnosis Date  . Alcohol abuse   . Anxiety   . Bipolar 2 disorder ([place])   . Cocaine abuse ([place])   . Depression   . Kidney stones   . Myocardial infarction ([place])   . Narcotic abuse ([place])   . Peptic ulcer   . Polysubstance abuse ([place])    . Renal disorder    kidney stones   Past Surgical History:  Procedure Laterality Date  . ESOPHAGOGASTRODUODENOSCOPY N/A 05/29/2014   Procedure: ESOPHAGOGASTRODUODENOSCOPY (EGD) ;  Surgeon: Beryle Beams, MD;  Location: [facility] ENDOSCOPY;  Service: Endoscopy;  Laterality: N/A;  check with Dr. Collene Mares about sedation type/timinng - I recommend MAC  . HAND SURGERY  2006  . LEFT HEART CATH AND CORONARY ANGIOGRAPHY N/A 06/21/2020   Procedure: LEFT HEART CATH AND CORONARY ANGIOGRAPHY;  Surgeon: Nelva Bush, MD;  Location: [place] CV LAB;  Service: Cardiovascular;  Laterality: N/A;   Family History  Problem Relation Age of Onset  . Heart disease Father    Social History   Socioeconomic History  . Marital status: Single    Spouse name: Not on file  . Number of children: Not on file  . Years of education: Not on file  . Highest education level: Not on file  Occupational History  . Not on file  Tobacco Use  . Smoking status: Current Every Day Smoker    Packs/day: 0.50    Types: Cigarettes  . Smokeless tobacco: Never Used  Vaping Use  . Vaping Use: Never used  Substance and Sexual Activity  . Alcohol use: Not Currently  . Drug use: Not Currently    Types: Marijuana, Cocaine, IV, Methamphetamines  Comment: heroin and meth  . Sexual activity: Not Currently  Other Topics Concern  . Not on file  Social History Narrative   Lives with a friend   Lost house and custody of son 9broke up with fiancee)   Nature conservation officer   Social Determinants of Radio broadcast assistant Strain: Not on file  Food Insecurity: Not on file  Transportation Needs: Not on file  Physical Activity: Not on file  Stress: Not on file  Social Connections: Not on file   Allergies  Allergen Reactions  . Fish Allergy Anaphylaxis  . Shellfish Allergy Anaphylaxis    Medications   (Not in a hospital admission)    Vitals   Vitals:   01/26/21 1302 01/26/21 1316 01/26/21 1440 01/26/21 1441  BP: (!)  141/91 (!) 127/91 (!) 134/95 (!) 134/95  Pulse: 92 93 92 94  Resp: _0 Temp:    98.3 F (36.8 C)  TempSrc:    Oral  SpO2: 97% 97% 98% 96%  Weight:      Height:         Body mass index is 24.64 kg/m.  Physical Exam   Exam conducted via videoconference  Physical Exam Gen: A&O x4, NAD HEENT: Atraumatic, normocephalic;mucous membranes moist; oropharynx clear, tongue without atrophy or fasciculations. Resp: normal work of breathing CV: extremities appear well-perfused Psych: Pt is a very difficult historian, tangential with pressured speech, who required continuous redirection from myself and the bedside RN to participate in the telestroke consult.   Neuro: *MS: A&O x4. Follows multi-step commands.  *Speech: fluid, nondysarthric, able to name and repeat *CN:    I: Deferred   II,III: PERRLA, VFF by confrontation   III,IV,VI: EOMI    V: Sensation intact from V1 to V3 to LT   VII: Eyelid closure was full.  Smile symmetric.   VIII: Hearing intact to voice  *Motor:   Normal bulk.  No tremor, rigidity or bradykinesia. LUE mild drift, other extremities no drift. *Sensory: No consistently reproducible sensory deficits *Coordination: pt unable to participate 2/2 confusion *Reflexes:  UTA over tele *Gait: deferred  NIHSS = 1 for LUE drift   Labs   CBC:  Recent Labs  Lab 01/26/21 1214 01/26/21 1253  WBC 9.2  --   HGB 13.9 14.3  HCT 42.4 42.0  MCV 90.8  --   PLT 262  --     Basic Metabolic Panel:  Lab Results  Component Value Date   NA 140 01/26/2021   K 4.4 01/26/2021   CO2 25 01/26/2021   GLUCOSE 111 (H) 01/26/2021   BUN 14 01/26/2021   CREATININE 1.20 01/26/2021   CALCIUM 9.3 01/26/2021   GFRNONAA >60 01/26/2021   GFRAA >60 07/04/2020   Lipid Panel:  Lab Results  Component Value Date   LDLCALC 72 12/21/2020   HgbA1c:  Lab Results  Component Value Date   HGBA1C 5.9 (H) 12/21/2020   Urine Drug Screen:     Component Value Date/Time    LABOPIA NONE DETECTED 01/26/2021 1211   COCAINSCRNUR NONE DETECTED 01/26/2021 1211   COCAINSCRNUR POSITIVE (A) 05/25/2015 1302   LABBENZ NONE DETECTED 01/26/2021 1211   AMPHETMU NONE DETECTED 01/26/2021 1211   THCU NONE DETECTED 01/26/2021 1211   LABBARB NONE DETECTED 01/26/2021 1211    Alcohol Level     Component Value Date/Time   ETH <10 12/16/2020 2048    Impression   35 yo man with hx polysubstance abuse (cocaine, meth, narcotics,  EtOH), prior MI, bipolar II disorder who presented to [facility] ED c/o new onset dysphagia and LUE weakness/incoordination with LKW 0100.   CTH NAICP, ASPECTS 10 (personally reviewed) tPA not administered bc patient presented outside of the window.  NIHSS = 1 for LUE drift with no other deficits.  CTA performed 2/2 exam not consistent with LVO and would not be interventional candidate due to low NIHSS.  History and exam is confounded by patient's inability to attend, appropriately answer questions, and follow commands. Suspect substance use vs hypomania 2/2 known bipolar disorder. 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  Recommendations   - Admit to [facility] hospitalist service - Permissive HTN x48 hrs from sx onset or until stroke ruled out by MRI goal BP <220/110. PRN labetalol or hydralazine if BP above these parameters. Avoid oral antihypertensives. - MRI brain wo contrast - CTA/MRA H&N - TTE w/ bubble - Check A1c and LDL + add statin per guidelines - Give ASA 353m f/b ASA 850mdaily after that - q4 hr neuro checks - STAT head CT for any change in neuro exam - Tele - PT/OT/SLP - Stroke education - Amb referral to neurology upon discharge  [facility].  If brain MRI shows acute stroke or there are other significant abnormalities on workup please [facility] if [facility].  ______________________________________________________________________   Thank you for the opportunity to take part in the care of this patient. If you have any further questions, please contact the neurology consultation attending.  Signed,  CoSu MonksMD [facility] [phone] 7pm- 7am, please page neurology on call as listed in [place] Addendum:  This was a telestroke consultation.   Requesting Provider:Dr. TrLangston Maskeronsult Participants: Bedside nurse, [facility] nurse,  patient, myself  [facility] was provided via telemedicine with 2-way video and audio communication. The patient/family was informed that care would be provided in this way and agreed to receive care in this manner.

## 2021-01-26 NOTE — ED Notes (Signed)
Pt back from MRI. Transported to floor via ED tech.

## 2021-01-26 NOTE — ED Notes (Signed)
Pt to MRI. Pt will be transferred to IP upon his return.

## 2021-01-27 ENCOUNTER — Other Ambulatory Visit (HOSPITAL_COMMUNITY): Payer: 59

## 2021-01-27 ENCOUNTER — Encounter (HOSPITAL_COMMUNITY): Payer: Self-pay | Admitting: Internal Medicine

## 2021-01-27 ENCOUNTER — Observation Stay (HOSPITAL_COMMUNITY): Payer: 59

## 2021-01-27 DIAGNOSIS — E860 Dehydration: Secondary | ICD-10-CM | POA: Diagnosis not present

## 2021-01-27 LAB — LIPID PANEL
Cholesterol: 122 mg/dL (ref 0–200)
HDL: 40 mg/dL — ABNORMAL LOW (ref >40)
LDL Cholesterol: 67 mg/dL (ref 0–99)
Total CHOL/HDL Ratio: 3.1 RATIO
Triglycerides: 75 mg/dL (ref <150)
VLDL: 15 mg/dL (ref 0–40)

## 2021-01-27 LAB — HEMOGLOBIN A1C
Hgb A1c MFr Bld: 5.8 % — ABNORMAL HIGH (ref 4.8–5.6)
Mean Plasma Glucose: 119.76 mg/dL

## 2021-01-27 NOTE — Discharge Summary (Signed)
Physician Discharge Summary  Brian [name] [id number] DOB: 1986-05-05 DOA: 01/26/2021  PCP: Marliss Coots, NP  Admit date: 01/26/2021 Discharge date: 01/27/2021  Admitted From: Home Disposition: Home  Recommendations for Outpatient Follow-up:  1. Follow up with PCP in 1-2 weeks 2. Follow-up with behavioral health as scheduled  Home Health: No Equipment/Devices: None  Discharge Condition: Stable CODE STATUS: Full code Diet recommendation: Regular diet  History of present illness:  Brian [name] is a 35 y.o. male with medical history significant of polysubstance abuse, bipolar disorder, CAD. Presenting with generalized weakness and paresthesias. He reports that he was in his normal state of health until this morning. 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 He felt weak in his legs and arms. He became disoriented and it seemed like his body would not follow his commands. He states he didn't fall, but he felt like he was "walking like a drunk." He sat down and tried to collect himself. He was frightened and decided to come to the ED.   In the ED, CTH was negative. Teleneurology was consulted. They recommended TIA w/u. UDS was negative. TRH was called for admission.   Hospital course:  Left-sided weakness: Resolved Patient presenting to ED with generalized weakness and paresthesias, worse on his left side.  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  Seen by teleneurology with recommendations of TIA work-up. patient's vital signs were within normal limits, afebrile without leukocytosis.  EtOH level less than 10, UDS negative.  Urinalysis unrevealing.  CT head without contrast with no acute intracranial abnormality.  MR brain with no acute or focal lesion to explain patient's symptoms.  MRA head/neck with normal circle of Willis without  significant proximal stenosis, aneurysm or branch vessel occlusion, flow is antegrade in the vertebral arteries bilaterally.  Patient symptoms have now resolved.  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  Patient encouraged to maintain hydration.  Outpatient follow-up with PCP.  Bipolar disorder Continue Seroquel, Remeron.  Outpatient follow-up with behavioral health.  History of polysubstance abuse Patient with prior history of cocaine, narcotics, methamphetamine and EtOH abuse.  Currently reports abstinence.  UDS and EtOH level negative.  Rash right upper extremity Etiology likely contact dermatitis versus ringworm.  Has been prescribed Lamisil outpatient.  States this is improving.  Continue Lamisil cream and outpatient follow-up with PCP.  Discharge Diagnoses:  Active Problems:   * No active hospital problems. *    Discharge Instructions  Discharge Instructions    Call MD for:  difficulty breathing, headache or visual disturbances   Complete by: As directed    Call MD for:  extreme fatigue   Complete by: As directed    Call MD for:  persistant dizziness or light-headedness   Complete by: As directed    Call MD for:  persistant nausea and vomiting   Complete by: As directed    Call MD for:  severe uncontrolled pain   Complete by: As directed    Call MD for:  temperature >100.4   Complete by: As directed    Diet - low sodium heart healthy   Complete by: As directed    Increase activity slowly   Complete by: As directed      Allergies  as of 01/27/2021      Reactions   Fish Allergy Anaphylaxis   Shellfish Allergy Anaphylaxis      Medication List    STOP taking these medications   ondansetron 4 MG disintegrating tablet Commonly known as: Zofran ODT   pantoprazole 40 MG tablet Commonly known as: PROTONIX     TAKE these medications   ibuprofen 200 MG  tablet Commonly known as: ADVIL Take 200 mg by mouth every 6 (six) hours as needed for headache.   mirtazapine 30 MG tablet Commonly known as: REMERON Take 30 mg by mouth at bedtime.   QUEtiapine 200 MG tablet Commonly known as: SEROQUEL Take 1 tablet (200 mg total) by mouth at bedtime.   terbinafine 1 % cream Commonly known as: LAMISIL Apply 1 application topically daily.       Follow-up Information    Placey, Audrea Muscat, NP. Schedule an appointment as soon as possible for a visit in 1 week(s).   Contact information: [place] [address] [phone]        Belva Crome, MD .   Specialty: Cardiology Contact information: [phone] [place] [address] [phone]              Allergies  Allergen Reactions  . Fish Allergy Anaphylaxis  . Shellfish Allergy Anaphylaxis    Consultations:  Teleneurology   Procedures/Studies: MR ANGIO HEAD WO CONTRAST  Result Date: 01/26/2021 CLINICAL DATA:  TIA. Dysphagia. Left hand weakness. Loss of strength in the hands and weakness in the legs. EXAM: MRI HEAD WITHOUT AND WITH CONTRAST MRA HEAD WITHOUT CONTRAST MRA NECK WITHOUT AND WITH CONTRAST TECHNIQUE: Multiplanar, multiecho pulse sequences of the brain and surrounding structures were obtained without and with intravenous contrast. Angiographic images of the Circle of Willis were obtained using MRA technique without intravenous contrast. Angiographic images of the neck were obtained using MRA technique without and with intravenous contrast. Carotid stenosis measurements (when applicable) are obtained utilizing NASCET criteria, using the distal internal carotid diameter as the denominator. CONTRAST:  22m GADAVIST GADOBUTROL 1 MMOL/ML IV SOLN COMPARISON:  CT head without contrast 01/26/2021 FINDINGS: MRI HEAD FINDINGS Brain: No acute infarct, hemorrhage, or mass lesion is present. No significant white matter lesions are present. Basal  ganglia are intact. Insular ribbon normal. The ventricles are of normal size. No significant extraaxial fluid collection is present. The internal auditory canals are within normal limits. The brainstem and cerebellum are within normal limits. Postcontrast images demonstrate no pathologic enhancement. Vascular: Flow is present in the major intracranial arteries. Skull and upper cervical spine: The craniocervical junction is normal. Upper cervical spine is within normal limits. Marrow signal is unremarkable. Sinuses/Orbits: The paranasal sinuses and mastoid air cells are clear. The globes and orbits are within normal limits. MRA HEAD FINDINGS Internal carotid arteries are within normal limits through the ICA termini bilaterally. The A1 and M1 segments are normal. Anterior communicating artery is patent. MCA bifurcations are within limits. The ACA and MCA branch vessels are normal. The vertebral arteries are codominant. PICA origins are visualized and normal. Both posterior scratched at basilar artery is normal. Both posterior cerebral arteries originate from basilar tip. The PCA branch vessels are within normal limits. MRA NECK FINDINGS The time-of-flight images demonstrate no significant flow disturbance at either carotid bifurcation. Flow is antegrade in the vertebral arteries bilaterally. Postcontrast images degraded by delayed timing. No significant stenosis is present in either carotid artery. Vertebral arteries are unremarkable. IMPRESSION:  1. Normal MRI appearance of the brain. No acute or focal lesion to explain the patient's symptoms. 2. Normal MRA circle-of-Willis without significant proximal stenosis, aneurysm, or branch vessel occlusion. 3. Flow is antegrade in the vertebral arteries bilaterally. Electronically Signed   By: San Morelle M.D.   On: 01/26/2021 18:25   MR Angiogram Neck W or Wo Contrast  Result Date: 01/26/2021 CLINICAL DATA:  TIA. Dysphagia. Left hand weakness. Loss of strength in  the hands and weakness in the legs. EXAM: MRI HEAD WITHOUT AND WITH CONTRAST MRA HEAD WITHOUT CONTRAST MRA NECK WITHOUT AND WITH CONTRAST TECHNIQUE: Multiplanar, multiecho pulse sequences of the brain and surrounding structures were obtained without and with intravenous contrast. Angiographic images of the Circle of Willis were obtained using MRA technique without intravenous contrast. Angiographic images of the neck were obtained using MRA technique without and with intravenous contrast. Carotid stenosis measurements (when applicable) are obtained utilizing NASCET criteria, using the distal internal carotid diameter as the denominator. CONTRAST:  45m GADAVIST GADOBUTROL 1 MMOL/ML IV SOLN COMPARISON:  CT head without contrast 01/26/2021 FINDINGS: MRI HEAD FINDINGS Brain: No acute infarct, hemorrhage, or mass lesion is present. No significant white matter lesions are present. Basal ganglia are intact. Insular ribbon normal. The ventricles are of normal size. No significant extraaxial fluid collection is present. The internal auditory canals are within normal limits. The brainstem and cerebellum are within normal limits. Postcontrast images demonstrate no pathologic enhancement. Vascular: Flow is present in the major intracranial arteries. Skull and upper cervical spine: The craniocervical junction is normal. Upper cervical spine is within normal limits. Marrow signal is unremarkable. Sinuses/Orbits: The paranasal sinuses and mastoid air cells are clear. The globes and orbits are within normal limits. MRA HEAD FINDINGS Internal carotid arteries are within normal limits through the ICA termini bilaterally. The A1 and M1 segments are normal. Anterior communicating artery is patent. MCA bifurcations are within limits. The ACA and MCA branch vessels are normal. The vertebral arteries are codominant. PICA origins are visualized and normal. Both posterior scratched at basilar artery is normal. Both posterior cerebral  arteries originate from basilar tip. The PCA branch vessels are within normal limits. MRA NECK FINDINGS The time-of-flight images demonstrate no significant flow disturbance at either carotid bifurcation. Flow is antegrade in the vertebral arteries bilaterally. Postcontrast images degraded by delayed timing. No significant stenosis is present in either carotid artery. Vertebral arteries are unremarkable. IMPRESSION: 1. Normal MRI appearance of the brain. No acute or focal lesion to explain the patient's symptoms. 2. Normal MRA circle-of-Willis without significant proximal stenosis, aneurysm, or branch vessel occlusion. 3. Flow is antegrade in the vertebral arteries bilaterally. Electronically Signed   By: CSan MorelleM.D.   On: 01/26/2021 18:25   MR BRAIN W WO CONTRAST  Result Date: 01/26/2021 CLINICAL DATA:  TIA. Dysphagia. Left hand weakness. Loss of strength in the hands and weakness in the legs. EXAM: MRI HEAD WITHOUT AND WITH CONTRAST MRA HEAD WITHOUT CONTRAST MRA NECK WITHOUT AND WITH CONTRAST TECHNIQUE: Multiplanar, multiecho pulse sequences of the brain and surrounding structures were obtained without and with intravenous contrast. Angiographic images of the Circle of Willis were obtained using MRA technique without intravenous contrast. Angiographic images of the neck were obtained using MRA technique without and with intravenous contrast. Carotid stenosis measurements (when applicable) are obtained utilizing NASCET criteria, using the distal internal carotid diameter as the denominator. CONTRAST:  768mGADAVIST GADOBUTROL 1 MMOL/ML IV SOLN COMPARISON:  CT head without contrast 01/26/2021 FINDINGS:  MRI HEAD FINDINGS Brain: No acute infarct, hemorrhage, or mass lesion is present. No significant white matter lesions are present. Basal ganglia are intact. Insular ribbon normal. The ventricles are of normal size. No significant extraaxial fluid collection is present. The internal auditory canals are  within normal limits. The brainstem and cerebellum are within normal limits. Postcontrast images demonstrate no pathologic enhancement. Vascular: Flow is present in the major intracranial arteries. Skull and upper cervical spine: The craniocervical junction is normal. Upper cervical spine is within normal limits. Marrow signal is unremarkable. Sinuses/Orbits: The paranasal sinuses and mastoid air cells are clear. The globes and orbits are within normal limits. MRA HEAD FINDINGS Internal carotid arteries are within normal limits through the ICA termini bilaterally. The A1 and M1 segments are normal. Anterior communicating artery is patent. MCA bifurcations are within limits. The ACA and MCA branch vessels are normal. The vertebral arteries are codominant. PICA origins are visualized and normal. Both posterior scratched at basilar artery is normal. Both posterior cerebral arteries originate from basilar tip. The PCA branch vessels are within normal limits. MRA NECK FINDINGS The time-of-flight images demonstrate no significant flow disturbance at either carotid bifurcation. Flow is antegrade in the vertebral arteries bilaterally. Postcontrast images degraded by delayed timing. No significant stenosis is present in either carotid artery. Vertebral arteries are unremarkable. IMPRESSION: 1. Normal MRI appearance of the brain. No acute or focal lesion to explain the patient's symptoms. 2. Normal MRA circle-of-Willis without significant proximal stenosis, aneurysm, or branch vessel occlusion. 3. Flow is antegrade in the vertebral arteries bilaterally. Electronically Signed   By: San Morelle M.D.   On: 01/26/2021 18:25   CT HEAD CODE STROKE WO CONTRAST  Result Date: 01/26/2021 CLINICAL DATA:  Code stroke. Left-sided weakness. Dysphagia. Symptoms began at 9 a.m. today. EXAM: CT HEAD WITHOUT CONTRAST TECHNIQUE: Contiguous axial images were obtained from the base of the skull through the vertex without intravenous  contrast. COMPARISON:  CT head without contrast 05/02/2014 FINDINGS: Brain: No acute infarct, hemorrhage, or mass lesion is present. Basal ganglia are within normal limits. Insular ribbon is normal bilaterally. No significant white matter lesions are present. The ventricles are of normal size. No significant extraaxial fluid collection is present. Vascular: No hyperdense vessel or unexpected calcification. Skull: Calvarium is intact. No focal lytic or blastic lesions are present. No significant extracranial soft tissue lesion is present. Sinuses/Orbits: The paranasal sinuses and mastoid air cells are clear. The globes and orbits are within normal limits. ASPECTS [facility] Stroke Program Early CT Score) - Ganglionic level infarction (caudate, lentiform nuclei, internal capsule, insula, M1-M3 cortex): 7/7 - Supraganglionic infarction (M4-M6 cortex): 3/3 Total score (0-10 with 10 being normal): 10/10 IMPRESSION: Negative CT of the head.  Aspects 10/10 These results were called by telephone at the time of interpretation on 01/26/2021 at 12:40 pm to provider MATTHEW TRIFAN , who verbally acknowledged these results. Electronically Signed   By: San Morelle M.D.   On: 01/26/2021 12:41      Subjective: Patient seen and examined at bedside, resting comfortably.  Patient reports symptoms now have self resolved.  Suspect dehydration in setting of strenuous outdoor work.  No other questions or concerns at this time.  Ready for discharge home.  Denies headache, no visual changes, no chest pain, palpitations, no shortness of breath, no abdominal pain, no weakness, no fatigue, no paresthesias.  No acute events overnight per nursing staff.  Discharge Exam: Vitals:   01/27/21 0000 01/27/21 0513  BP: (!) 91/54  Pulse:    Resp:  20  Temp:  98 F (36.7 C)  SpO2:     Vitals:   01/26/21 2100 01/26/21 2200 01/27/21 0000 01/27/21 0513  BP:  113/76 (!) 91/54   Pulse: 87 77    Resp:  13  20  Temp: 98.3 F (36.8  C)   98 F (36.7 C)  TempSrc: Oral   Oral  SpO2: 97% 97%    Weight:      Height:        General: Pt is alert, awake, not in acute distress Cardiovascular: RRR, S1/S2 +, no rubs, no gallops Respiratory: CTA bilaterally, no wheezing, no rhonchi Abdominal: Soft, NT, ND, bowel sounds + Extremities: no edema, no cyanosis Integumentary: Circular rash noted anterior surface right upper extremity    The results of significant diagnostics from this hospitalization (including imaging, microbiology, ancillary and laboratory) are listed below for reference.     Microbiology: Recent Results (from the past 240 hour(s))  Resp Panel by RT-PCR (Flu A&B, Covid)     Status: None   Collection Time: 01/26/21 12:20 PM   Specimen: Nasopharyngeal(NP) swabs in vial transport medium  Result Value Ref Range Status   SARS Coronavirus 2 by RT PCR NEGATIVE NEGATIVE Final    Comment: (NOTE) SARS-CoV-2 target nucleic acids are NOT DETECTED.  The SARS-CoV-2 RNA is generally detectable in upper respiratory specimens during the acute phase of infection. The lowest concentration of SARS-CoV-2 viral copies this assay can detect is 138 copies/mL. A negative result does not preclude SARS-Cov-2 infection and should not be used as the sole basis for treatment or other patient management decisions. A negative result may occur with  improper specimen collection/handling, submission of specimen other than nasopharyngeal swab, presence of viral mutation(s) within the areas targeted by this assay, and inadequate number of viral copies(<138 copies/mL). A negative result must be combined with clinical observations, patient history, and epidemiological information. The expected result is Negative.  Fact Sheet for Patients:  EntrepreneurPulse.com.au  Fact Sheet for Healthcare Providers:  IncredibleEmployment.be  This test is no t yet approved or cleared by the [place] FDA and   has been authorized for detection and/or diagnosis of SARS-CoV-2 by FDA under an Emergency Use Authorization (EUA). This EUA will remain  in effect (meaning this test can be used) for the duration of the COVID-19 declaration under Section 564(b)(1) of the Act, 21 U.S.C.section 360bbb-3(b)(1), unless the authorization is terminated  or revoked sooner.       Influenza A by PCR NEGATIVE NEGATIVE Final   Influenza B by PCR NEGATIVE NEGATIVE Final    Comment: (NOTE) The Xpert Xpress SARS-CoV-2/FLU/RSV plus assay is intended as an aid in the diagnosis of influenza from Nasopharyngeal swab specimens and should not be used as a sole basis for treatment. Nasal washings and aspirates are unacceptable for Xpert Xpress SARS-CoV-2/FLU/RSV testing.  Fact Sheet for Patients: EntrepreneurPulse.com.au  Fact Sheet for Healthcare Providers: IncredibleEmployment.be  This test is not yet approved or cleared by the [place] FDA and has been authorized for detection and/or diagnosis of SARS-CoV-2 by FDA under an Emergency Use Authorization (EUA). This EUA will remain in effect (meaning this test can be used) for the duration of the COVID-19 declaration under Section 564(b)(1) of the Act, 21 U.S.C. section 360bbb-3(b)(1), unless the authorization is terminated or revoked.  Performed at [facility], [place] [address] [place], [place] [address]      Labs: BNP (last 3 results) No  results for input(s): BNP in the last 8760 hours. Basic Metabolic Panel: Recent Labs  Lab 01/26/21 1214 01/26/21 1220 01/26/21 1253  NA 138 138 140  K 4.8 4.6 4.4  CL 106 105 105  CO2 25 25  --   GLUCOSE 112* 112* 111*  BUN '15 15 14  '$ CREATININE 1.30* 1.31* 1.20  CALCIUM 9.3 9.2  --    Liver Function Tests: Recent Labs  Lab 01/26/21 1220  AST 24  ALT 27  ALKPHOS 77  BILITOT 0.6  PROT 8.2*  ALBUMIN 4.5   No results for input(s): LIPASE, AMYLASE  in the last 168 hours. No results for input(s): AMMONIA in the last 168 hours. CBC: Recent Labs  Lab 01/26/21 1214 01/26/21 1253 01/26/21 1532  WBC 9.2  --  9.2  NEUTROABS  --   --  6.1  HGB 13.9 14.3 13.4  HCT 42.4 42.0 41.4  MCV 90.8  --  90.6  PLT 262  --  251   Cardiac Enzymes: No results for input(s): CKTOTAL, CKMB, CKMBINDEX, TROPONINI in the last 168 hours. BNP: Invalid input(s): POCBNP CBG: Recent Labs  Lab 01/26/21 1157  GLUCAP 111*   D-Dimer No results for input(s): DDIMER in the last 72 hours. Hgb A1c No results for input(s): HGBA1C in the last 72 hours. Lipid Profile Recent Labs    01/27/21 0420  CHOL 122  HDL 40*  LDLCALC 67  TRIG 75  CHOLHDL 3.1   Thyroid function studies No results for input(s): TSH, T4TOTAL, T3FREE, THYROIDAB in the last 72 hours.  Invalid input(s): FREET3 Anemia work up No results for input(s): VITAMINB12, FOLATE, FERRITIN, TIBC, IRON, RETICCTPCT in the last 72 hours. Urinalysis    Component Value Date/Time   COLORURINE YELLOW 01/26/2021 1211   APPEARANCEUR CLEAR 01/26/2021 1211   LABSPEC 1.009 01/26/2021 1211   PHURINE 6.0 01/26/2021 1211   GLUCOSEU NEGATIVE 01/26/2021 1211   HGBUR NEGATIVE 01/26/2021 1211   BILIRUBINUR NEGATIVE 01/26/2021 1211   KETONESUR NEGATIVE 01/26/2021 1211   PROTEINUR NEGATIVE 01/26/2021 1211   UROBILINOGEN 0.2 05/21/2015 0116   NITRITE NEGATIVE 01/26/2021 1211   LEUKOCYTESUR NEGATIVE 01/26/2021 1211   Sepsis Labs Invalid input(s): PROCALCITONIN,  WBC,  LACTICIDVEN Microbiology Recent Results (from the past 240 hour(s))  Resp Panel by RT-PCR (Flu A&B, Covid)     Status: None   Collection Time: 01/26/21 12:20 PM   Specimen: Nasopharyngeal(NP) swabs in vial transport medium  Result Value Ref Range Status   SARS Coronavirus 2 by RT PCR NEGATIVE NEGATIVE Final    Comment: (NOTE) SARS-CoV-2 target nucleic acids are NOT DETECTED.  The SARS-CoV-2 RNA is generally detectable in upper  respiratory specimens during the acute phase of infection. The lowest concentration of SARS-CoV-2 viral copies this assay can detect is 138 copies/mL. A negative result does not preclude SARS-Cov-2 infection and should not be used as the sole basis for treatment or other patient management decisions. A negative result may occur with  improper specimen collection/handling, submission of specimen other than nasopharyngeal swab, presence of viral mutation(s) within the areas targeted by this assay, and inadequate number of viral copies(<138 copies/mL). A negative result must be combined with clinical observations, patient history, and epidemiological information. The expected result is Negative.  Fact Sheet for Patients:  EntrepreneurPulse.com.au  Fact Sheet for Healthcare Providers:  IncredibleEmployment.be  This test is no t yet approved or cleared by the [place] FDA and  has been authorized for detection and/or diagnosis of SARS-CoV-2 by FDA  under an Emergency Use Authorization (EUA). This EUA will remain  in effect (meaning this test can be used) for the duration of the COVID-19 declaration under Section 564(b)(1) of the Act, 21 U.S.C.section 360bbb-3(b)(1), unless the authorization is terminated  or revoked sooner.       Influenza A by PCR NEGATIVE NEGATIVE Final   Influenza B by PCR NEGATIVE NEGATIVE Final    Comment: (NOTE) The Xpert Xpress SARS-CoV-2/FLU/RSV plus assay is intended as an aid in the diagnosis of influenza from Nasopharyngeal swab specimens and should not be used as a sole basis for treatment. Nasal washings and aspirates are unacceptable for Xpert Xpress SARS-CoV-2/FLU/RSV testing.  Fact Sheet for Patients: EntrepreneurPulse.com.au  Fact Sheet for Healthcare Providers: IncredibleEmployment.be  This test is not yet approved or cleared by the [place] FDA and has been  authorized for detection and/or diagnosis of SARS-CoV-2 by FDA under an Emergency Use Authorization (EUA). This EUA will remain in effect (meaning this test can be used) for the duration of the COVID-19 declaration under Section 564(b)(1) of the Act, 21 U.S.C. section 360bbb-3(b)(1), unless the authorization is terminated or revoked.  Performed at [facility], [place] [address], [place], [place] [address]      Time coordinating discharge: Over 30 minutes  SIGNED:   Brenlynn Fake J [place] DO  [facility] 01/27/2021, 8:44 AM

## 2021-01-27 NOTE — Progress Notes (Signed)
RN reviewed discharge paperwork with pt. All questions addressed. RN informed pt that per Dr. [place] pt did not need the Echo scheduled for today at 10 am. IV removed. Tele removed. No further needs at this time. Pt left ambulatory in stable condition.

## 2021-01-27 NOTE — Progress Notes (Signed)
PT Cancellation Note  Patient Details Name: CANDY ARAMBUL MRN: [id number] DOB: [birth year]   Cancelled Treatment:     PT order received and chart reviewed.  On arrival to room, Dr [place] exiting and states pt with no PT needs at this time and eval not necessary.  PT service to sign off at this time.   Duval Macleod 01/27/2021, 8:14 AM

## 2021-01-28 ENCOUNTER — Encounter (HOSPITAL_COMMUNITY): Payer: Self-pay | Admitting: Internal Medicine

## 2021-02-22 ENCOUNTER — Other Ambulatory Visit: Payer: Self-pay

## 2021-02-22 ENCOUNTER — Ambulatory Visit (HOSPITAL_COMMUNITY)
Admission: AD | Admit: 2021-02-22 | Discharge: 2021-02-22 | Disposition: A | Discharge status: Home / Self Care | Payer: 59 | Attending: Psychiatry | Admitting: Psychiatry

## 2021-02-22 ENCOUNTER — Ambulatory Visit (HOSPITAL_COMMUNITY)
Admission: EM | Admit: 2021-02-22 | Discharge: 2021-02-22 | Disposition: A | Discharge status: Home / Self Care | Payer: 59

## 2021-02-22 DIAGNOSIS — F1520 Other stimulant dependence, uncomplicated: Secondary | ICD-10-CM

## 2021-02-22 DIAGNOSIS — F1510 Other stimulant abuse, uncomplicated: Secondary | ICD-10-CM | POA: Diagnosis not present

## 2021-02-22 DIAGNOSIS — R45851 Suicidal ideations: Secondary | ICD-10-CM | POA: Insufficient documentation

## 2021-02-22 DIAGNOSIS — F1910 Other psychoactive substance abuse, uncomplicated: Secondary | ICD-10-CM
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
## 2021-02-22 NOTE — H&P (Incomplete)
Behavioral Health Medical Screening Exam  Brian [name] is an 35 y.o. male who presented to [facility] as walk-in for assessment of passive suicidal ideations and methamphetamine abuse. Patient reports last use "late last night"; sno  Total Time spent with patient: 30 minutes  Psychiatric Specialty Exam: Physical Exam Vitals reviewed.  Psychiatric:        Attention and Perception: Perception normal.        Speech: Speech is rapid and pressured.        Behavior: Behavior is cooperative.        Thought Content: Thought content normal.        Cognition and Memory: Cognition and memory normal.        Judgment: Judgment is impulsive.    Review of Systems  Psychiatric/Behavioral: Positive for decreased concentration.  All other systems reviewed and are negative.  There were no vitals taken for this visit.There is no height or weight on file to calculate BMI. General Appearance: Casual Eye Contact:  inconsistent Speech:  Clear and Coherent and Pressured Volume:  Normal Mood:  Euthymic Affect:  Non-Congruent Thought Process:  Goal Directed Orientation:  Full (Time, Place, and Person) Thought Content:  Logical and Tangential Suicidal Thoughts:  No Homicidal Thoughts:  No Memory:  Immediate;   Fair Recent;   Fair Judgement:  Fair Insight:  Present and Shallow Psychomotor Activity:  Increased Concentration: Concentration: Fair and Attention Span: Fair Recall:  Bee: Fair Akathisia:  NA Handed:   AIMS (if indicated):    Assets:  Communication Skills Desire for Improvement Physical Health Resilience Vocational/Educational Sleep:     Musculoskeletal: Strength & Muscle Tone: within normal limits Gait & Station: normal Patient leans: N/A  There were no vitals taken for this visit.  Recommendations: Based on my evaluation the patient does not appear to have an emergency medical condition. Provider and patient discussed Neva Seat;  patient later declined and asked to discharge. Denies any active suicidal or homicidal ideations, auditory or visual hallucinations, and does not appear to be responding to any external/internal stimuli at this time.   Inda Merlin, NP 02/22/2021, 1:52 PM

## 2021-02-22 NOTE — BH Assessment (Signed)
Per chart pt was assessed by [name], NP and Ignacia Marvel, LCSW earlier today (02/22/2021).   [name], NP recommends based on my evaluation the patient does not appear to have an emergency medical condition. Provider and patient discussed Brian [name]; patient later declined and asked to discharge. Denies any active suicidal or homicidal ideations, auditory or visual hallucinations, and does not appear to be responding to any external/internal stimuli at this time.   LCSW provided pt with OPT resources while at [facility].   Brian [name] is a 35 year old male who presents voluntary and unaccompanied to [place]. Clinician asked the pt, "what brought you to the hospital?" Pt reported, he relapsed on Methamphetamines, stopped going to meetings (NA); a friend found him and took him to [place] for help. Pt reported, he started to panic, left [facility] and was told if he had issues to come to [facility]. Pt reports he's suicidal; he has a bad heart due to meth use. Pt also reported, he has two stints in his heart; his doctor reports, he will die if he continues to use meth. Pt reports, AVH only when using methamphetamine. Pt denies, HI, self-injurious behaviors and access to weapons. Clinician asked the pt if he will try to kill himself if discharged, pt replied, he will keep getting high and will eventually die.   Clinician observed the pt mentioning going back to [facility] as he was leaving [facility].  Determination of need: Routine.   Brian [name], [name], [facility], [facility] Triage Specialist [phone]

## 2021-02-22 NOTE — ED Provider Notes (Addendum)
Behavioral Health Urgent Care Medical Screening Exam  Patient Name: Brian [name] MRN: [id number] Date of Evaluation: 02/23/21 Chief Complaint:   Diagnosis:  Final diagnoses:  Amphetamine use disorder, severe ([place])    History of Present illness: Brian [name] is a 35 y.o. male. Presented voluntarily to [facility] with chief complaint of "methamphetamine abuse." Patient report that he was assessed earlier today at [facility] and would like to go to [facility] for rehab. Patient states "I have a bad heart and if I don't stop using drug I'm going to end up dead."   Patient denies current suicidal ideations to this writer but had endorsed SI to TTS counselor. He denies HI, AVH, paranoia, and there was no indication of delusion or that patient was responding to any internal/external stimuli.   Patient was assessed by this Probation officer. Patient is noted to be restless, fidgeting  alert and oriented, speech is clear and coherent, mood is anxious and congruent with affect. Thought process is coherent. Patient denies chest pain, SOB, dizziness, headache, visual changes, GI/GU symptoms.    Psychiatric Specialty Exam  Presentation  General Appearance:Appropriate for Environment  Eye Contact:Fleeting  Speech:Clear and Coherent  Speech Volume:Normal  Handedness:Right   Mood and Affect  Mood:Anxious  Affect:Appropriate   Thought Process  Thought Processes:Coherent; Goal Directed  Descriptions of Associations:Intact  Orientation:Full (Time, Place and Person)  Thought Content:Logical  Diagnosis of Schizophrenia or Schizoaffective disorder in past: No   Hallucinations:None  Ideas of Reference:None  Suicidal Thoughts:No With Plan  Homicidal Thoughts:No Without Plan   Sensorium  Memory:Immediate Good; Recent Good; Remote Good  Judgment:Good  Insight:Good   Executive Functions  Concentration:Good  Attention Span:Good  [place]  Language:Good   Psychomotor Activity  Psychomotor Activity:Normal   Assets  Assets:Physical Health; Desire for Improvement   Sleep  Sleep:Fair  Number of hours: No data recorded  Nutritional Assessment (For OBS and FBC admissions only) Has the patient had a weight loss or gain of 10 pounds or more in the last 3 months?: No Has the patient had a decrease in food intake/or appetite?: No Does the patient have dental problems?: No Does the patient have eating habits or behaviors that may be indicators of an eating disorder including binging or inducing vomiting?: No Has the patient recently lost weight without trying?: No Has the patient been eating poorly because of a decreased appetite?: No Malnutrition Screening Tool Score: 0    Physical Exam: Physical Exam Vitals and nursing note reviewed.  Constitutional:      Appearance: He is well-developed.  HENT:     Head: Normocephalic and atraumatic.     Mouth/Throat:     Mouth: Mucous membranes are dry.  Eyes:     General:        Right eye: No discharge.        Left eye: No discharge.     Conjunctiva/sclera: Conjunctivae normal.  Cardiovascular:     Rate and Rhythm: Normal rate.     Heart sounds: No murmur heard.   Pulmonary:     Effort: Pulmonary effort is normal. No respiratory distress.     Breath sounds: Normal breath sounds.  Abdominal:     Palpations: Abdomen is soft.     Tenderness: There is no abdominal tenderness.  Musculoskeletal:     Cervical back: Neck supple.  Skin:    General: Skin is warm and dry.  Neurological:     Mental Status: He is alert and oriented  to person, place, and time. Mental status is at baseline.  Psychiatric:        Attention and Perception: He is inattentive. He does not perceive auditory or visual hallucinations.        Mood and Affect: Mood is anxious. Mood is not depressed.        Speech: Speech normal.        Behavior: Behavior is cooperative.        Thought  Content: Thought content is not paranoid or delusional. Thought content does not include homicidal or suicidal ideation. Thought content does not include homicidal or suicidal plan.        Cognition and Memory: Cognition normal.    Review of Systems  Constitutional: Negative.  Negative for chills and fever.  HENT: Negative.  Negative for ear discharge and ear pain.   Eyes: Negative.  Negative for pain, discharge and redness.  Respiratory: Negative.   Cardiovascular: Negative.  Negative for chest pain and palpitations.  Gastrointestinal: Negative.  Negative for abdominal pain, nausea and vomiting.  Genitourinary: Negative.   Musculoskeletal: Negative.   Skin: Negative.   Neurological: Negative.  Negative for tingling, tremors, seizures, loss of consciousness and headaches.  Endo/Heme/Allergies: Negative.   Psychiatric/Behavioral: Positive for substance abuse. Negative for depression, hallucinations and suicidal ideas. The patient is nervous/anxious.    Blood pressure 136/75, pulse 92, temperature 97.9 F (36.6 C), temperature source Tympanic, resp. rate 18, SpO2 100 %. There is no height or weight on file to calculate BMI.  Musculoskeletal: Strength & Muscle Tone: within normal limits Gait & Station: normal Patient leans: Right   [place] MSE Discharge Disposition for Follow up and Recommendations: Based on my evaluation the patient does not appear to have an emergency medical condition and can be discharged with resources and follow up care in outpatient services for Medication Management and Individual Therapy   Outpatient resources given to patient and patient encourage to follow up with with [facility] or return to [facility] as needed.    Ophelia Shoulder, NP 02/23/2021, 4:41 AM

## 2021-02-22 NOTE — ED Notes (Addendum)
Pt discharged in no acute distress. Denied SI/HI/AVH. A&O x4, ambulatory. Verbalized understanding of AVS instructions reviewed by RN. Belongings returned to pt intact from "orange" locker. Pt escorted to lobby by staff. Pt threw AVS into trashcan prior to leaving and states he is "going to [employer] maintained.

## 2021-02-22 NOTE — H&P (Signed)
Behavioral Health Medical Screening Exam  Brian [name] is an 35 y.o. male who presented to [facility] as walk-in for assessment of passive suicidal ideations and methamphetamine abuse. Patient states he is primarily seeking "help" with his addiction. Per chart review, patient was last seen at the [facility] on 12/27/20 for similar presentation after being dismissed from [facility]; patient was discharged to [facility]. Patient reports last use "late last night"; states preferred method as "eating and snorting".   Patient presents restless and pressured; appears possibly intoxicated. Patient denies active intoxication; states he's been on a "5 day binge" of methamphetamine use and x1 use of bupropenorphine he received from "a friend". Patient acknowledged a need for change regarding current substance use; endorses "feeling bad" and "feeling like I want to die somedays". Denies any active intent or plan; states "with my heart condition if I don't stop it's going to be by meth".   Provider and patient discussed local outpatient options for detox; patient states he's been to "every program a couple of times". Verbalized interest in [place]; provider contacted staff to coordinate care. Provider was later notified patient was declining [facility] and any further treatment; asking to be discharged.   On reassessment patient states he has "changed his mind" and was no longer interested in a program where he has to stay due to his need to "work and make money. If I go back there that will mess up my money". 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   Patient denies any suicidal or homicidal ideations, auditory or visual hallucinations, and does not appear to be actively psychotic at this time. Patient contracts for safety and given outpatient resources for [facility] and IOP for individual follow-up.   Total Time spent with patient: 30  minutes  Psychiatric Specialty Exam: Physical Exam Vitals reviewed.  Psychiatric:        Attention and Perception: Perception normal.        Speech: Speech is rapid and pressured.        Behavior: Behavior is cooperative.        Thought Content: Thought content normal.        Cognition and Memory: Cognition and memory normal.        Judgment: Judgment is impulsive.    Review of Systems  Psychiatric/Behavioral: Positive for decreased concentration.  All other systems reviewed and are negative.  There were no vitals taken for this visit.There is no height or weight on file to calculate BMI. General Appearance: Casual Eye Contact:  inconsistent Speech:  Clear and Coherent and Pressured Volume:  Normal Mood:  Euthymic Affect:  Non-Congruent Thought Process:  Goal Directed Orientation:  Full (Time, Place, and Person) Thought Content:  Logical and Tangential Suicidal Thoughts:  No Homicidal Thoughts:  No Memory:  Immediate;   Fair Recent;   Fair Judgement:  Fair Insight:  Present and Shallow Psychomotor Activity:  Increased Concentration: Concentration: Fair and Attention Span: Fair Recall:  Hill: Fair Akathisia:  NA Handed:   AIMS (if indicated):    Assets:  Communication Skills Desire for Improvement Physical Health Resilience Vocational/Educational Sleep:     Musculoskeletal: Strength & Muscle Tone: within normal limits Gait & Station: normal Patient leans: N/A  There were no vitals taken for this visit.  Recommendations: Based on my evaluation the patient does not appear to have an emergency medical condition. Provider and  patient discussed [facility]; patient later declined and asked to discharge. Denies any active suicidal or homicidal ideations, auditory or visual hallucinations, and does not appear to be responding to any external/internal stimuli at this time.   Inda Merlin, NP 02/22/2021, 3:52 PM

## 2021-02-22 NOTE — Discharge Instructions (Signed)
  Discharge recommendations:  Patient is to take medications as prescribed. Please see information for follow-up appointment with psychiatry and therapy. Please follow up with your primary care provider for all medical related needs.   Therapy: We recommend that patient participate in individual therapy to address mental health concerns.  Medications: The parent/guardian is to contact a medical professional and/or outpatient provider to address any new side effects that develop. Parent/guardian should update outpatient providers of any new medications and/or medication changes.    Safety:  The patient should abstain from use of illicit substances/drugs and abuse of any medications. If symptoms worsen or do not continue to improve or if the patient becomes actively suicidal or homicidal then it is recommended that the patient return to the closest hospital emergency department, the [facility], or call 911 for further evaluation and treatment. National Suicide Prevention Lifeline 1-800-SUICIDE or (201)303-3432.

## 2021-02-23 ENCOUNTER — Other Ambulatory Visit: Payer: Self-pay

## 2021-02-23 ENCOUNTER — Emergency Department (HOSPITAL_COMMUNITY)
Admission: EM | Admit: 2021-02-23 | Discharge: 2021-02-23 | Disposition: A | Discharge status: Other Acute Inpatient Hospital | Payer: 59 | Attending: Emergency Medicine | Admitting: Emergency Medicine

## 2021-02-23 ENCOUNTER — Encounter (HOSPITAL_COMMUNITY): Payer: Self-pay | Admitting: Registered Nurse

## 2021-02-23 ENCOUNTER — Ambulatory Visit (HOSPITAL_COMMUNITY)
Admission: EM | Admit: 2021-02-23 | Discharge: 2021-02-24 | Disposition: A | Discharge status: Home / Self Care | Payer: 59 | Attending: Urology | Admitting: Urology

## 2021-02-23 DIAGNOSIS — F1520 Other stimulant dependence, uncomplicated: Secondary | ICD-10-CM

## 2021-02-23 DIAGNOSIS — F1510 Other stimulant abuse, uncomplicated: Secondary | ICD-10-CM | POA: Diagnosis not present

## 2021-02-23 DIAGNOSIS — R45851 Suicidal ideations: Secondary | ICD-10-CM | POA: Insufficient documentation

## 2021-02-23 DIAGNOSIS — F1099 Alcohol use, unspecified with unspecified alcohol-induced disorder: Secondary | ICD-10-CM | POA: Diagnosis present

## 2021-02-23 DIAGNOSIS — F3181 Bipolar II disorder: Secondary | ICD-10-CM | POA: Insufficient documentation

## 2021-02-23 DIAGNOSIS — F1910 Other psychoactive substance abuse, uncomplicated: Secondary | ICD-10-CM | POA: Diagnosis not present

## 2021-02-23 DIAGNOSIS — Z20822 Contact with and (suspected) exposure to covid-19: Secondary | ICD-10-CM | POA: Diagnosis not present

## 2021-02-23 DIAGNOSIS — F17210 Nicotine dependence, cigarettes, uncomplicated: Secondary | ICD-10-CM | POA: Diagnosis not present

## 2021-02-23 LAB — COMPREHENSIVE METABOLIC PANEL
ALT: 49 U/L — ABNORMAL HIGH (ref 0–44)
AST: 55 U/L — ABNORMAL HIGH (ref 15–41)
Albumin: 3.7 g/dL (ref 3.5–5.0)
Alkaline Phosphatase: 68 U/L (ref 38–126)
Anion gap: 7 (ref 5–15)
BUN: 17 mg/dL (ref 6–20)
CO2: 26 mmol/L (ref 22–32)
Calcium: 8.8 mg/dL — ABNORMAL LOW (ref 8.9–10.3)
Chloride: 102 mmol/L (ref 98–111)
Creatinine, Ser: 1.78 mg/dL — ABNORMAL HIGH (ref 0.61–1.24)
GFR, Estimated: 50 mL/min — ABNORMAL LOW (ref >60)
Glucose, Bld: 111 mg/dL — ABNORMAL HIGH (ref 70–99)
Potassium: 4.1 mmol/L (ref 3.5–5.1)
Sodium: 135 mmol/L (ref 135–145)
Total Bilirubin: 0.6 mg/dL (ref 0.3–1.2)
Total Protein: 6.7 g/dL (ref 6.5–8.1)

## 2021-02-23 LAB — CBC
HCT: 39.6 % (ref 39.0–52.0)
Hemoglobin: 13.1 g/dL (ref 13.0–17.0)
MCH: 29.9 pg (ref 26.0–34.0)
MCHC: 33.1 g/dL (ref 30.0–36.0)
MCV: 90.4 fL (ref 80.0–100.0)
Platelets: 236 10*3/uL (ref 150–400)
RBC: 4.38 MIL/uL (ref 4.22–5.81)
RDW: 13.7 % (ref 11.5–15.5)
WBC: 6.1 10*3/uL (ref 4.0–10.5)
nRBC: 0 % (ref 0.0–0.2)

## 2021-02-23 LAB — RAPID URINE DRUG SCREEN, HOSP PERFORMED
Amphetamines: POSITIVE — AB
Barbiturates: NOT DETECTED
Benzodiazepines: NOT DETECTED
Cocaine: NOT DETECTED
Opiates: NOT DETECTED
Tetrahydrocannabinol: NOT DETECTED

## 2021-02-23 LAB — ACETAMINOPHEN LEVEL: Acetaminophen (Tylenol), Serum: <10 ug/mL — ABNORMAL LOW (ref 10–30)

## 2021-02-23 LAB — ETHANOL: Alcohol, Ethyl (B): <10 mg/dL (ref <10)

## 2021-02-23 LAB — RESP PANEL BY RT-PCR (FLU A&B, COVID) ARPGX2
Influenza A by PCR: NEGATIVE
Influenza B by PCR: NEGATIVE
SARS Coronavirus 2 by RT PCR: NEGATIVE

## 2021-02-23 LAB — SALICYLATE LEVEL: Salicylate Lvl: <7 mg/dL — ABNORMAL LOW (ref 7.0–30.0)

## 2021-02-23 MED ORDER — QUETIAPINE FUMARATE 200 MG PO TABS
200.0000 mg | ORAL_TABLET | Freq: Every day | ORAL | Status: DC
  Administered 2021-02-23: 200 mg via ORAL
  Filled 2021-02-23: qty 1

## 2021-02-23 MED ORDER — LORAZEPAM 2 MG/ML IJ SOLN: 0.0000 mg | Freq: Four times a day (QID) | INTRAMUSCULAR | Status: DC

## 2021-02-23 MED ORDER — PANTOPRAZOLE SODIUM 40 MG PO TBEC
40.0000 mg | DELAYED_RELEASE_TABLET | Freq: Two times a day (BID) | ORAL | Status: DC
  Administered 2021-02-23: 40 mg via ORAL
  Filled 2021-02-23 (×2): qty 1

## 2021-02-23 MED ORDER — ALUM & MAG HYDROXIDE-SIMETH 200-200-20 MG/5ML PO SUSP: 30.0000 mL | ORAL | Status: DC | PRN

## 2021-02-23 MED ORDER — LORAZEPAM 1 MG PO TABS
0.0000 mg | ORAL_TABLET | Freq: Four times a day (QID) | ORAL | Status: DC
  Administered 2021-02-23: 1 mg via ORAL
  Filled 2021-02-23: qty 1

## 2021-02-23 MED ORDER — GABAPENTIN 300 MG PO CAPS
300.0000 mg | ORAL_CAPSULE | Freq: Three times a day (TID) | ORAL | Status: DC
  Administered 2021-02-23 (×2): 300 mg via ORAL
  Filled 2021-02-23 (×3): qty 1

## 2021-02-23 MED ORDER — NICOTINE 21 MG/24HR TD PT24: 21.0000 mg | MEDICATED_PATCH | Freq: Every day | TRANSDERMAL | Status: DC

## 2021-02-23 MED ORDER — ACETAMINOPHEN 325 MG PO TABS: 650.0000 mg | ORAL_TABLET | Freq: Four times a day (QID) | ORAL | Status: DC | PRN

## 2021-02-23 MED ORDER — THIAMINE HCL 100 MG PO TABS
100.0000 mg | ORAL_TABLET | Freq: Every day | ORAL | Status: DC
  Administered 2021-02-23: 100 mg via ORAL
  Filled 2021-02-23: qty 1

## 2021-02-23 MED ORDER — TRAZODONE HCL 50 MG PO TABS: 50.0000 mg | ORAL_TABLET | Freq: Every evening | ORAL | Status: DC | PRN

## 2021-02-23 MED ORDER — LORAZEPAM 2 MG/ML IJ SOLN: 0.0000 mg | Freq: Two times a day (BID) | INTRAMUSCULAR | Status: DC

## 2021-02-23 MED ORDER — BUPROPION HCL ER (XL) 300 MG PO TB24: 300.0000 mg | ORAL_TABLET | Freq: Every day | ORAL | Status: DC

## 2021-02-23 MED ORDER — HYDROXYZINE HCL 25 MG PO TABS
25.0000 mg | ORAL_TABLET | Freq: Three times a day (TID) | ORAL | Status: DC | PRN
  Filled 2021-02-23: qty 1

## 2021-02-23 MED ORDER — QUETIAPINE FUMARATE 200 MG PO TABS: 200.0000 mg | ORAL_TABLET | Freq: Every day | ORAL | Status: DC

## 2021-02-23 MED ORDER — MIRTAZAPINE 30 MG PO TABS: 30.0000 mg | ORAL_TABLET | Freq: Every day | ORAL | Status: DC

## 2021-02-23 MED ORDER — LORAZEPAM 1 MG PO TABS: 0.0000 mg | ORAL_TABLET | Freq: Two times a day (BID) | ORAL | Status: DC

## 2021-02-23 MED ORDER — MAGNESIUM HYDROXIDE 400 MG/5ML PO SUSP: 30.0000 mL | Freq: Every day | ORAL | Status: DC | PRN

## 2021-02-23 MED ORDER — THIAMINE HCL 100 MG/ML IJ SOLN: 100.0000 mg | Freq: Every day | INTRAMUSCULAR | Status: DC

## 2021-02-23 NOTE — ED Notes (Signed)
Phone report called to Roxy Manns RN [facility], all questions answered.

## 2021-02-23 NOTE — Progress Notes (Signed)
Per Olena Leatherwood, patient meets criteria for inpatient treatment. There are no available or appropriate beds at [facility] today. CSW faxed referrals to the following facilities for review:  [facility]  TTS will continue to seek bed placement.  Glennie Isle, MSW, [place], LCAS-A Phone: [phone] Disposition/TOC

## 2021-02-23 NOTE — ED Notes (Addendum)
Pt belongings inventoried and personal bags searched for harmful items, items placed in locker 4, valuables placed with security, home meds inventoried and will be taken to pharmacy. Pt wanded by security

## 2021-02-23 NOTE — ED Notes (Signed)
When Probation officer and RN Latricia went to do pt skin assessment. NP was coming through the double door and pt was witnessed yelling, cursing and being verbal abusive to NP. Saying "why the fuck do I need to be here in this room" 'why do you need to ask me any questions" yelling to top of his lungs. "I have already been through the process why the fuck am I in this room? Writer explained it is a process when being a direct admit we need to do skin assessment and doctor speak to you so they can put medications orders in. Pt kept yelling and cursing writer walked out. We did his skin assessment and when pt came back on unit he told the RN"get me a juice"

## 2021-02-23 NOTE — ED Notes (Signed)
Belongings returned to patient, all sheets for returned belongings including medication retrieved form pharmacy signed by patient and staff. patient is dressed. Remains cooperative.

## 2021-02-23 NOTE — ED Notes (Addendum)
Pt A&O x 4, no distress noted, [facility] transfer, pt presents with suicidal ideations with plan to overdose on medication.  Pt also has access to guns.  Pt admits to using Meth and alcohol yesterday.  Pt irritable and anxious.  Comfort measures given.  Monitoring for safety, no distress noted.

## 2021-02-23 NOTE — ED Provider Notes (Signed)
[facility] Emergency Department Provider Note MRN:  [id number]  Arrival date & time: 02/23/21     Chief Complaint   Suicidal   History of Present Illness   Brian [name] is a 35 y.o. year-old male with a history of bipolar disorder, polysubstance abuse presenting to the ED with chief complaint of suicide.  Patient is depressed about his lack of sobriety.  He explains that he was sober for 3 years but he has relapsed and cannot get himself together.  Using alcohol, methamphetamine.  At this point wants to kill himself.  Thinking about overdosing or getting a gun.  Denies any bodily pain or discomfort.  Review of Systems  A complete 10 system review of systems was obtained and all systems are negative except as noted in the HPI and PMH.   Patient's Health History    Past Medical History:  Diagnosis Date  . Alcohol abuse   . Anxiety   . Bipolar 2 disorder ([place])   . Cocaine abuse ([place])   . Depression   . Kidney stones   . Myocardial infarction ([place])   . Narcotic abuse ([place])   . Peptic ulcer   . Polysubstance abuse ([place])   . Renal disorder    kidney stones    Past Surgical History:  Procedure Laterality Date  . ESOPHAGOGASTRODUODENOSCOPY N/A 05/29/2014   Procedure: ESOPHAGOGASTRODUODENOSCOPY (EGD) ;  Surgeon: Beryle Beams, MD;  Location: [facility] ENDOSCOPY;  Service: Endoscopy;  Laterality: N/A;  check with Dr. Collene Mares about sedation type/timinng - I recommend MAC  . HAND SURGERY  2006  . LEFT HEART CATH AND CORONARY ANGIOGRAPHY N/A 06/21/2020   Procedure: LEFT HEART CATH AND CORONARY ANGIOGRAPHY;  Surgeon: Nelva Bush, MD;  Location: [facility] CV LAB;  Service: Cardiovascular;  Laterality: N/A;    Family History  Problem Relation Age of Onset  . Heart disease Father     Social History   Socioeconomic History  . Marital status: Single    Spouse name: Not on file  . Number of children: Not on file  . Years of education: Not on file  .  Highest education level: Not on file  Occupational History  . Not on file  Tobacco Use  . Smoking status: Current Every Day Smoker    Packs/day: 0.50    Types: Cigarettes  . Smokeless tobacco: Never Used  Vaping Use  . Vaping Use: Never used  Substance and Sexual Activity  . Alcohol use: Not Currently  . Drug use: Not Currently    Types: Marijuana, Cocaine, IV, Methamphetamines    Comment: heroin and meth  . Sexual activity: Not Currently  Other Topics Concern  . Not on file  Social History Narrative   ** Merged History Encounter **       Lives with a friend   Lost house and custody of son 9broke up with fiancee)   Nature conservation officer   Social Determinants of Radio broadcast assistant Strain: Not on file  Food Insecurity: Not on file  Transportation Needs: Not on file  Physical Activity: Not on file  Stress: Not on file  Social Connections: Not on file  Intimate Partner Violence: Not on file     Physical Exam   Vitals:   02/23/21 0116  BP: 109/81  Pulse: 77  Resp: 16  Temp: 97.9 F (36.6 C)  SpO2: 97%    CONSTITUTIONAL: Well-appearing, NAD NEURO:  Alert and oriented x 3, no focal deficits EYES:  eyes equal and reactive ENT/NECK:  no LAD, no JVD CARDIO: Regular rate, well-perfused, normal S1 and S2 PULM:  CTAB no wheezing or rhonchi GI/GU:  normal bowel sounds, non-distended, non-tender MSK/SPINE:  No gross deformities, no edema SKIN:  no rash, atraumatic PSYCH:  Appropriate speech and behavior  *Additional and/or pertinent findings included in MDM below  Diagnostic and Interventional Summary    EKG Interpretation  Date/Time:    Ventricular Rate:    PR Interval:    QRS Duration:   QT Interval:    QTC Calculation:   R Axis:     Text Interpretation:        Labs Reviewed  COMPREHENSIVE METABOLIC PANEL - Abnormal; Notable for the following components:      Result Value   Glucose, Bld 111 (*)    Creatinine, Ser 1.78 (*)    Calcium 8.8 (*)     AST 55 (*)    ALT 49 (*)    GFR, Estimated 50 (*)    All other components within normal limits  SALICYLATE LEVEL - Abnormal; Notable for the following components:   Salicylate Lvl <0.9 (*)    All other components within normal limits  ACETAMINOPHEN LEVEL - Abnormal; Notable for the following components:   Acetaminophen (Tylenol), Serum <10 (*)    All other components within normal limits  RAPID URINE DRUG SCREEN, HOSP PERFORMED - Abnormal; Notable for the following components:   Amphetamines POSITIVE (*)    All other components within normal limits  RESP PANEL BY RT-PCR (FLU A&B, COVID) ARPGX2  ETHANOL  CBC    No orders to display    Medications - No data to display   Procedures  /  Critical Care Procedures  ED Course and Medical Decision Making  I have reviewed the triage vital signs, the nursing notes, and pertinent available records from the EMR.  Listed above are laboratory and imaging tests that I personally ordered, reviewed, and interpreted and then considered in my medical decision making (see below for details).  Suicidality, here voluntarily, medically cleared, awaiting TTS evaluation.       Barth Kirks. Sedonia Small, [place] [email or web address]  Final Clinical Impressions(s) / ED Diagnoses     ICD-10-CM   1. Suicidal ideation  R45.851   2. Polysubstance abuse ([place])  F19.10     ED Discharge Orders    None       Discharge Instructions Discussed with and Provided to Patient:   Discharge Instructions   None       Maudie Flakes, MD 02/23/21 [phone]
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
## 2021-02-23 NOTE — ED Triage Notes (Signed)
Pt c/o being SI, was clean for two months and "fell off the wagon".

## 2021-02-23 NOTE — ED Notes (Signed)
Attempted to call report to [facility] [phone]. Received message that RN was unavailable and would return the call, VM message with return phone # was left.

## 2021-02-23 NOTE — ED Notes (Signed)
Dr Carin Hock notified patient to remain inpatient psych for placement.

## 2021-02-23 NOTE — ED Notes (Signed)
PATIENT MEDICATIONS COUNTED AND SENT TO PHARMACY.

## 2021-02-24 DIAGNOSIS — F1520 Other stimulant dependence, uncomplicated: Secondary | ICD-10-CM | POA: Diagnosis not present

## 2021-02-24 DIAGNOSIS — R45851 Suicidal ideations: Secondary | ICD-10-CM | POA: Diagnosis not present

## 2021-02-24 NOTE — ED Notes (Signed)
Patient sleep, safety checks complete

## 2021-02-24 NOTE — ED Provider Notes (Signed)
Behavioral Health Admission [facility]&P [name] & OBS)  Date: 02/24/21 Patient Name: Brian [name] MRN: [id number] Chief Complaint:  Chief Complaint  Patient presents with  . Suicidal      Diagnoses:  Final diagnoses:  Amphetamine use disorder, severe (HCC)  Suicidal ideation    HPI: Brian [name] is a 35 y/o male. Patient presented from [facility] with complaint of ongoing suicidal ideation. Patient report that he is depressed and disappointed in his inability to maintain a drug free lifestyle. When ask if he is experiencing any specific depressive symptoms patient became very upset with this Probation officer and started yelling stating "I have already been assessed by the doctor at [facility] ED and was told that I'm going to be admitted here. I'm suicidal and i'm going to fucking kill myself. I went to [facility] and came here yesterday and you guys assessed me. Why am I been put in an assessment room again?" Patient then stepped out of the room and pointed to the sign on the wall, " this says assessment I don't need another fucking assessment. I need to get out of this room and go in the back so I can sleep."  This writer attempted to redirect patient and to inform patient that writer need to complete medical screening prior to to his admission to the unit but patient continue to scream and posture towards writer and flipped over the chair. Writer then existed the room due to safety concern for self.    Patient didn't not complain of any SOB, chest pain or acute distress. Patient was later noted on the unit resting/sleeping.   PHQ 2-9:  Flowsheet Row ED from 05/02/2020 in [place] DEPT  Thoughts that you would be better off dead, or of hurting yourself in some way More than half the days  PHQ-9 Total Score 12      [place] ED from 02/23/2021 in [facility] Most recent reading at 02/23/2021  8:27 PM ED from 02/23/2021 in [place] Most recent reading at 02/23/2021 10:24 AM ED from 01/25/2021 in [place] DEPT Most recent reading at 01/25/2021  9:56 AM  C-SSRS RISK CATEGORY Error: Q7 should not be populated when Q6 is No High Risk No Risk       Total Time spent with patient: 20 minutes  Musculoskeletal  Strength & Muscle Tone: within normal limits Gait & Station: normal Patient leans: Right  Psychiatric Specialty Exam  Presentation General Appearance: Appropriate for Environment  Eye Contact:Good  Speech:Pressured  Speech Volume:Increased  Handedness:Right   Mood and Affect  Mood:Irritable  Affect:Congruent   Thought Process  Thought Processes:Coherent  Descriptions of Associations:Intact  Orientation:Full (Time, Place and Person)  Thought Content:WDL  Diagnosis of Schizophrenia or Schizoaffective disorder in past: No   Hallucinations:Hallucinations: None  Ideas of Reference:None  Suicidal Thoughts:Suicidal Thoughts: Yes, Active (plan to overdose on meth)  Homicidal Thoughts:Homicidal Thoughts: No   Sensorium  Memory:Immediate Good; Remote Good; Recent Good  Judgment:Poor  Insight:Good   Executive Functions  Concentration:Good  Attention Span:Good  [place] of Knowledge:Good  Language:Good   Psychomotor Activity  Psychomotor Activity:Psychomotor Activity: Normal   Assets  Assets:Desire for Improvement; Physical Health   Sleep  Sleep:Sleep: Fair Number of Hours of Sleep: 6   Nutritional Assessment (For OBS and FBC admissions only) Has the patient had a weight loss or gain of 10 pounds or more in the last 3 months?:  No Has the patient had a decrease in food intake/or appetite?: No Does the patient have dental problems?: No Does the patient have eating habits or behaviors that may be indicators of an eating disorder including binging or inducing vomiting?: No Has the patient recently lost  weight without trying?: No Has the patient been eating poorly because of a decreased appetite?: No Malnutrition Screening Tool Score: 0    Physical Exam Vitals and nursing note reviewed.  Constitutional:      General: He is not in acute distress.    Appearance: He is well-developed.  HENT:     Head: Normocephalic and atraumatic.  Eyes:     Conjunctiva/sclera: Conjunctivae normal.  Cardiovascular:     Rate and Rhythm: Normal rate.     Heart sounds: No murmur heard.   Pulmonary:     Effort: Pulmonary effort is normal. No respiratory distress.  Musculoskeletal:        General: Normal range of motion.  Skin:    Coloration: Skin is not jaundiced.  Neurological:     Mental Status: He is alert and oriented to person, place, and time.  Psychiatric:        Attention and Perception: He is attentive.        Mood and Affect: Affect is angry.        Behavior: Behavior is uncooperative, agitated and aggressive.        Thought Content: Thought content includes suicidal ideation. Thought content does not include homicidal ideation.    Review of Systems  Constitutional: Negative for chills and fever.  Respiratory: Negative for cough and hemoptysis.   Cardiovascular: Negative for chest pain.  Psychiatric/Behavioral: Positive for depression, substance abuse and suicidal ideas.    Blood pressure 133/73, pulse 73, temperature 98.2 F (36.8 C), temperature source Tympanic, resp. rate 18, SpO2 97 %. There is no height or weight on file to calculate BMI.  Past Psychiatric History:  Amphetamine use disorder, Bipolar II disorder  Is the patient at risk to self? Yes  Has the patient been a risk to self in the past 6 months? No .    Has the patient been a risk to self within the distant past? No   Is the patient a risk to others? No   Has the patient been a risk to others in the past 6 months? No   Has the patient been a risk to others within the distant past? No   Past Medical History:   Past Medical History:  Diagnosis Date  . Alcohol abuse   . Anxiety   . Bipolar 2 disorder ([place])   . Cocaine abuse ([place])   . Depression   . Kidney stones   . Myocardial infarction ([place])   . Narcotic abuse ([place])   . Peptic ulcer   . Polysubstance abuse ([place])   . Renal disorder    kidney stones    Past Surgical History:  Procedure Laterality Date  . ESOPHAGOGASTRODUODENOSCOPY N/A 05/29/2014   Procedure: ESOPHAGOGASTRODUODENOSCOPY (EGD) ;  Surgeon: Beryle Beams, MD;  Location: [facility] ENDOSCOPY;  Service: Endoscopy;  Laterality: N/A;  check with Dr. Collene Mares about sedation type/timinng - I recommend MAC  . HAND SURGERY  2006  . LEFT HEART CATH AND CORONARY ANGIOGRAPHY N/A 06/21/2020   Procedure: LEFT HEART CATH AND CORONARY ANGIOGRAPHY;  Surgeon: Nelva Bush, MD;  Location: [place] CV LAB;  Service: Cardiovascular;  Laterality: N/A;    Family History:  Family History  Problem Relation Age  of Onset  . Heart disease Father     Social History:  Social History   Socioeconomic History  . Marital status: Single    Spouse name: Not on file  . Number of children: Not on file  . Years of education: Not on file  . Highest education level: Not on file  Occupational History  . Not on file  Tobacco Use  . Smoking status: Current Every Day Smoker    Packs/day: 0.50    Types: Cigarettes  . Smokeless tobacco: Never Used  Vaping Use  . Vaping Use: Never used  Substance and Sexual Activity  . Alcohol use: Not Currently  . Drug use: Not Currently    Types: Marijuana, Cocaine, IV, Methamphetamines    Comment: heroin and meth  . Sexual activity: Not Currently  Other Topics Concern  . Not on file  Social History Narrative   ** Merged History Encounter **       Lives with a friend   Lost house and custody of son 9broke up with fiancee)   Nature conservation officer   Social Determinants of Radio broadcast assistant Strain: Not on file  Food Insecurity: Not on file  Transportation  Needs: Not on file  Physical Activity: Not on file  Stress: Not on file  Social Connections: Not on file  Intimate Partner Violence: Not on file    SDOH:  SDOH Screenings   Alcohol Screen: Low Risk   . Last Alcohol Screening Score (AUDIT): 0  Depression (PHQ2-9): Medium Risk  . PHQ-2 Score: 12  Financial Resource Strain: Not on file  Food Insecurity: Not on file  Housing: High Risk  . Last Housing Risk Score: 2  Physical Activity: Not on file  Social Connections: Not on file  Stress: Not on file  Tobacco Use: High Risk  . Smoking Tobacco Use: Current Every Day Smoker  . Smokeless Tobacco Use: Never Used  Transportation Needs: Not on file    Last Labs:  Admission on 02/23/2021, Discharged on 02/23/2021  Component Date Value Ref Range Status  . Sodium 02/23/2021 135  135 - 145 mmol/L Final  . Potassium 02/23/2021 4.1  3.5 - 5.1 mmol/L Final  . Chloride 02/23/2021 102  98 - 111 mmol/L Final  . CO2 02/23/2021 26  22 - 32 mmol/L Final  . Glucose, Bld 02/23/2021 111* 70 - 99 mg/dL Final   Glucose reference range applies only to samples taken after fasting for at least 8 hours.  . BUN 02/23/2021 17  6 - 20 mg/dL Final  . Creatinine, Ser 02/23/2021 1.78* 0.61 - 1.24 mg/dL Final  . Calcium 02/23/2021 8.8* 8.9 - 10.3 mg/dL Final  . Total Protein 02/23/2021 6.7  6.5 - 8.1 g/dL Final  . Albumin 02/23/2021 3.7  3.5 - 5.0 g/dL Final  . AST 02/23/2021 55* 15 - 41 U/L Final  . ALT 02/23/2021 49* 0 - 44 U/L Final  . Alkaline Phosphatase 02/23/2021 68  38 - 126 U/L Final  . Total Bilirubin 02/23/2021 0.6  0.3 - 1.2 mg/dL Final  . GFR, Estimated 02/23/2021 50* >60 mL/min Final   Comment: (NOTE) Calculated using the CKD-EPI Creatinine Equation (2021)   . Anion gap 02/23/2021 7  5 - 15 Final   Performed at [facility] [address] [place], [place] [address]  . Alcohol, Ethyl (B) 02/23/2021 <10  <10 mg/dL Final   Comment: (NOTE) Lowest detectable limit for serum alcohol is  10 mg/dL.  For  medical purposes only. Performed at [facility], [place] [address], [place], [place] [address]   . Salicylate Lvl 32/67/1245 <7.0* 7.0 - 30.0 mg/dL Final   Performed at [facility] [address], [place], [place] [address]  . Acetaminophen (Tylenol), Serum 02/23/2021 <10* 10 - 30 ug/mL Final   Comment: (NOTE) Therapeutic concentrations vary significantly. A range of 10-30 ug/mL  may be an effective concentration for many patients. However, some  are best treated at concentrations outside of this range. Acetaminophen concentrations >150 ug/mL at 4 hours after ingestion  and >50 ug/mL at 12 hours after ingestion are often associated with  toxic reactions.  Performed at [facility], [place] [address] [place], [place] [address]   . WBC 02/23/2021 6.1  4.0 - 10.5 K/uL Final  . RBC 02/23/2021 4.38  4.22 - 5.81 MIL/uL Final  . Hemoglobin 02/23/2021 13.1  13.0 - 17.0 g/dL Final  . HCT 02/23/2021 39.6  39.0 - 52.0 % Final  . MCV 02/23/2021 90.4  80.0 - 100.0 fL Final  . MCH 02/23/2021 29.9  26.0 - 34.0 pg Final  . MCHC 02/23/2021 33.1  30.0 - 36.0 g/dL Final  . RDW 02/23/2021 13.7  11.5 - 15.5 % Final  . Platelets 02/23/2021 236  150 - 400 K/uL Final  . nRBC 02/23/2021 0.0  0.0 - 0.2 % Final   Performed at [facility], [place] [address], [place], [place] [address]  . Opiates 02/23/2021 NONE DETECTED  NONE DETECTED Final  . Cocaine 02/23/2021 NONE DETECTED  NONE DETECTED Final  . Benzodiazepines 02/23/2021 NONE DETECTED  NONE DETECTED Final  . Amphetamines 02/23/2021 POSITIVE* NONE DETECTED Final  . Tetrahydrocannabinol 02/23/2021 NONE DETECTED  NONE DETECTED Final  . Barbiturates 02/23/2021 NONE DETECTED  NONE DETECTED Final   Comment: (NOTE) DRUG SCREEN FOR MEDICAL PURPOSES ONLY.  IF CONFIRMATION IS NEEDED FOR ANY PURPOSE, NOTIFY LAB WITHIN 5 DAYS.  LOWEST DETECTABLE LIMITS FOR URINE DRUG SCREEN Drug Class                     Cutoff  (ng/mL) Amphetamine and metabolites    1000 Barbiturate and metabolites    200 Benzodiazepine                 673 Tricyclics and metabolites     300 Opiates and metabolites        300 Cocaine and metabolites        300 THC                            50 Performed at [facility], [place] [address] [place], [place] [address]   . SARS Coronavirus 2 by RT PCR 02/23/2021 NEGATIVE  NEGATIVE Final   Comment: (NOTE) SARS-CoV-2 target nucleic acids are NOT DETECTED.  The SARS-CoV-2 RNA is generally detectable in upper respiratory specimens during the acute phase of infection. The lowest concentration of SARS-CoV-2 viral copies this assay can detect is 138 copies/mL. A negative result does not preclude SARS-Cov-2 infection and should not be used as the sole basis for treatment or other patient management decisions. A negative result may occur with  improper specimen collection/handling, submission of specimen other than nasopharyngeal swab, presence of viral mutation(s) within the areas targeted by this assay, and inadequate number of viral copies(<138 copies/mL). A negative result must be combined with clinical observations, patient history, and epidemiological  information. The expected result is Negative.  Fact Sheet for Patients:  EntrepreneurPulse.com.au  Fact Sheet for Healthcare Providers:  IncredibleEmployment.be  This test is no                          t yet approved or cleared by the [place] FDA and  has been authorized for detection and/or diagnosis of SARS-CoV-2 by FDA under an Emergency Use Authorization (EUA). This EUA will remain  in effect (meaning this test can be used) for the duration of the COVID-19 declaration under Section 564(b)(1) of the Act, 21 U.S.C.section 360bbb-3(b)(1), unless the authorization is terminated  or revoked sooner.      . Influenza A by PCR 02/23/2021 NEGATIVE  NEGATIVE Final  . Influenza B by  PCR 02/23/2021 NEGATIVE  NEGATIVE Final   Comment: (NOTE) The Xpert Xpress SARS-CoV-2/FLU/RSV plus assay is intended as an aid in the diagnosis of influenza from Nasopharyngeal swab specimens and should not be used as a sole basis for treatment. Nasal washings and aspirates are unacceptable for Xpert Xpress SARS-CoV-2/FLU/RSV testing.  Fact Sheet for Patients: EntrepreneurPulse.com.au  Fact Sheet for Healthcare Providers: IncredibleEmployment.be  This test is not yet approved or cleared by the [place] FDA and has been authorized for detection and/or diagnosis of SARS-CoV-2 by FDA under an Emergency Use Authorization (EUA). This EUA will remain in effect (meaning this test can be used) for the duration of the COVID-19 declaration under Section 564(b)(1) of the Act, 21 U.S.C. section 360bbb-3(b)(1), unless the authorization is terminated or revoked.  Performed at [facility], [place] [address], [place], [place] [address]   Admission on 01/26/2021, Discharged on 01/27/2021  Component Date Value Ref Range Status  . Sodium 01/26/2021 138  135 - 145 mmol/L Final  . Potassium 01/26/2021 4.8  3.5 - 5.1 mmol/L Final  . Chloride 01/26/2021 106  98 - 111 mmol/L Final  . CO2 01/26/2021 25  22 - 32 mmol/L Final  . Glucose, Bld 01/26/2021 112* 70 - 99 mg/dL Final   Glucose reference range applies only to samples taken after fasting for at least 8 hours.  . BUN 01/26/2021 15  6 - 20 mg/dL Final  . Creatinine, Ser 01/26/2021 1.30* 0.61 - 1.24 mg/dL Final  . Calcium 01/26/2021 9.3  8.9 - 10.3 mg/dL Final  . GFR, Estimated 01/26/2021 >60  >60 mL/min Final   Comment: (NOTE) Calculated using the CKD-EPI Creatinine Equation (2021)   . Anion gap 01/26/2021 7  5 - 15 Final   Performed at [facility], [place] [address] [place], [place] [address]  . WBC 01/26/2021 9.2  4.0 - 10.5 K/uL Final  . RBC 01/26/2021 4.67  4.22 - 5.81 MIL/uL  Final  . Hemoglobin 01/26/2021 13.9  13.0 - 17.0 g/dL Final  . HCT 01/26/2021 42.4  39.0 - 52.0 % Final  . MCV 01/26/2021 90.8  80.0 - 100.0 fL Final  . MCH 01/26/2021 29.8  26.0 - 34.0 pg Final  . MCHC 01/26/2021 32.8  30.0 - 36.0 g/dL Final  . RDW 01/26/2021 14.6  11.5 - 15.5 % Final  . Platelets 01/26/2021 262  150 - 400 K/uL Final  . nRBC 01/26/2021 0.0  0.0 - 0.2 % Final   Performed at [facility], [place] [address] [place], [place] [address]  . Color, Urine 01/26/2021 YELLOW  YELLOW Final  . APPearance 01/26/2021 CLEAR  CLEAR Final  . Specific  Gravity, Urine 01/26/2021 1.009  1.005 - 1.030 Final  . pH 01/26/2021 6.0  5.0 - 8.0 Final  . Glucose, UA 01/26/2021 NEGATIVE  NEGATIVE mg/dL Final  . Hgb urine dipstick 01/26/2021 NEGATIVE  NEGATIVE Final  . Bilirubin Urine 01/26/2021 NEGATIVE  NEGATIVE Final  . Ketones, ur 01/26/2021 NEGATIVE  NEGATIVE mg/dL Final  . Protein, ur 01/26/2021 NEGATIVE  NEGATIVE mg/dL Final  . Nitrite 01/26/2021 NEGATIVE  NEGATIVE Final  . Chal[place] Guest 01/26/2021 NEGATIVE  NEGATIVE Final   Performed at [place] [address], [place], [place] [address]  . Glucose-Capillary 01/26/2021 111* 70 - 99 mg/dL Final   Glucose reference range applies only to samples taken after fasting for at least 8 hours.  [place] Kitchen SARS Coronavirus 2 by RT PCR 01/26/2021 NEGATIVE  NEGATIVE Final   Comment: (NOTE) SARS-CoV-2 target nucleic acids are NOT DETECTED.  The SARS-CoV-2 RNA is generally detectable in upper respiratory specimens during the acute phase of infection. The lowest concentration of SARS-CoV-2 viral copies this assay can detect is 138 copies/mL. A negative result does not preclude SARS-Cov-2 infection and should not be used as the sole basis for treatment or other patient management decisions. A negative result may occur with  improper specimen collection/handling, submission of specimen other than nasopharyngeal swab,  presence of viral mutation(s) within the areas targeted by this assay, and inadequate number of viral copies(<138 copies/mL). A negative result must be combined with clinical observations, patient history, and epidemiological information. The expected result is Negative.  Fact Sheet for Patients:  EntrepreneurPulse.com.au  Fact Sheet for Healthcare Providers:  IncredibleEmployment.be  This test is no                          t yet approved or cleared by the [place] FDA and  has been authorized for detection and/or diagnosis of SARS-CoV-2 by FDA under an Emergency Use Authorization (EUA). This EUA will remain  in effect (meaning this test can be used) for the duration of the COVID-19 declaration under Section 564(b)(1) of the Act, 21 U.S.C.section 360bbb-3(b)(1), unless the authorization is terminated  or revoked sooner.      . Influenza A by PCR 01/26/2021 NEGATIVE  NEGATIVE Final  . Influenza B by PCR 01/26/2021 NEGATIVE  NEGATIVE Final   Comment: (NOTE) The Xpert Xpress SARS-CoV-2/FLU/RSV plus assay is intended as an aid in the diagnosis of influenza from Nasopharyngeal swab specimens and should not be used as a sole basis for treatment. Nasal washings and aspirates are unacceptable for Xpert Xpress SARS-CoV-2/FLU/RSV testing.  Fact Sheet for Patients: EntrepreneurPulse.com.au  Fact Sheet for Healthcare Providers: IncredibleEmployment.be  This test is not yet approved or cleared by the [place] FDA and has been authorized for detection and/or diagnosis of SARS-CoV-2 by FDA under an Emergency Use Authorization (EUA). This EUA will remain in effect (meaning this test can be used) for the duration of the COVID-19 declaration under Section 564(b)(1) of the Act, 21 U.S.C. section 360bbb-3(b)(1), unless the authorization is terminated or revoked.  Performed at [facility],  [place] [address], [place], [place] [address]   . Sodium 01/26/2021 140  135 - 145 mmol/L Final  . Potassium 01/26/2021 4.4  3.5 - 5.1 mmol/L Final  . Chloride 01/26/2021 105  98 - 111 mmol/L Final  . BUN 01/26/2021 14  6 - 20 mg/dL Final  . Creatinine, Ser 01/26/2021 1.20  0.61 - 1.24 mg/dL Final  . Glucose,  Bld 01/26/2021 111* 70 - 99 mg/dL Final   Glucose reference range applies only to samples taken after fasting for at least 8 hours.  . Calcium, Ion 01/26/2021 1.22  1.15 - 1.40 mmol/L Final  . TCO2 01/26/2021 26  22 - 32 mmol/L Final  . Hemoglobin 01/26/2021 14.3  13.0 - 17.0 g/dL Final  . HCT 01/26/2021 42.0  39.0 - 52.0 % Final  . Alcohol, Ethyl (B) 01/26/2021 <10  <10 mg/dL Final   Comment: (NOTE) Lowest detectable limit for serum alcohol is 10 mg/dL.  For medical purposes only. Performed at [facility], [place] [address], [place], [place] [address]   . Prothrombin Time 01/26/2021 11.8  11.4 - 15.2 seconds Final  . INR 01/26/2021 0.9  0.8 - 1.2 Final   Comment: (NOTE) INR goal varies based on device and disease states. Performed at [facility], [place] [address], [place], [place] [address]   . aPTT 01/26/2021 26  24 - 36 seconds Final   Performed at [facility], [place] [address], [place], [place] [address]  . Sodium 01/26/2021 138  135 - 145 mmol/L Final  . Potassium 01/26/2021 4.6  3.5 - 5.1 mmol/L Final  . Chloride 01/26/2021 105  98 - 111 mmol/L Final  . CO2 01/26/2021 25  22 - 32 mmol/L Final  . Glucose, Bld 01/26/2021 112* 70 - 99 mg/dL Final   Glucose reference range applies only to samples taken after fasting for at least 8 hours.  . BUN 01/26/2021 15  6 - 20 mg/dL Final  . Creatinine, Ser 01/26/2021 1.31* 0.61 - 1.24 mg/dL Final  . Calcium 01/26/2021 9.2  8.9 - 10.3 mg/dL Final  . Total Protein 01/26/2021 8.2* 6.5 - 8.1 g/dL Final  . Albumin 01/26/2021 4.5  3.5 - 5.0 g/dL Final  . AST 01/26/2021 24  15 - 41  U/L Final  . ALT 01/26/2021 27  0 - 44 U/L Final  . Alkaline Phosphatase 01/26/2021 77  38 - 126 U/L Final  . Total Bilirubin 01/26/2021 0.6  0.3 - 1.2 mg/dL Final  . GFR, Estimated 01/26/2021 >60  >60 mL/min Final   Comment: (NOTE) Calculated using the CKD-EPI Creatinine Equation (2021)   . Anion gap 01/26/2021 8  5 - 15 Final   Performed at [facility], [place] [address], [place], [place] [address]  . Opiates 01/26/2021 NONE DETECTED  NONE DETECTED Final  . Cocaine 01/26/2021 NONE DETECTED  NONE DETECTED Final  . Benzodiazepines 01/26/2021 NONE DETECTED  NONE DETECTED Final  . Amphetamines 01/26/2021 NONE DETECTED  NONE DETECTED Final  . Tetrahydrocannabinol 01/26/2021 NONE DETECTED  NONE DETECTED Final  . Barbiturates 01/26/2021 NONE DETECTED  NONE DETECTED Final   Comment: (NOTE) DRUG SCREEN FOR MEDICAL PURPOSES ONLY.  IF CONFIRMATION IS NEEDED FOR ANY PURPOSE, NOTIFY LAB WITHIN 5 DAYS.  LOWEST DETECTABLE LIMITS FOR URINE DRUG SCREEN Drug Class                     Cutoff (ng/mL) Amphetamine and metabolites    1000 Barbiturate and metabolites    200 Benzodiazepine                 825 Tricyclics and metabolites     300 Opiates and metabolites        300 Cocaine and metabolites        300 THC  50 Performed at [facility], [place] [address] [place], [place] [address]   . Troponin I (High Sensitivity) 01/26/2021 3  <18 ng/L Final   Comment: (NOTE) Elevated high sensitivity troponin I (hsTnI) values and significant  changes across serial measurements may suggest ACS but many other  chronic and acute conditions are known to elevate hsTnI results.  Refer to the "Links" section for chest pain algorithms and additional  guidance. Performed at [facility], [place] [address] [place], [place] [address]   . WBC 01/26/2021 9.2  4.0 - 10.5 K/uL Final  . RBC 01/26/2021 4.57  4.22 - 5.81 MIL/uL Final  .  Hemoglobin 01/26/2021 13.4  13.0 - 17.0 g/dL Final  . HCT 01/26/2021 41.4  39.0 - 52.0 % Final  . MCV 01/26/2021 90.6  80.0 - 100.0 fL Final  . MCH 01/26/2021 29.3  26.0 - 34.0 pg Final  . MCHC 01/26/2021 32.4  30.0 - 36.0 g/dL Final  . RDW 01/26/2021 14.6  11.5 - 15.5 % Final  . Platelets 01/26/2021 251  150 - 400 K/uL Final  . nRBC 01/26/2021 0.0  0.0 - 0.2 % Final  . Neutrophils Relative % 01/26/2021 66  % Final  . Neutro Abs 01/26/2021 6.1  1.7 - 7.7 K/uL Final  . Lymphocytes Relative 01/26/2021 22  % Final  . Lymphs Abs 01/26/2021 2.0  0.7 - 4.0 K/uL Final  . Monocytes Relative 01/26/2021 8  % Final  . Monocytes Absolute 01/26/2021 0.7  0.1 - 1.0 K/uL Final  . Eosinophils Relative 01/26/2021 4  % Final  . Eosinophils Absolute 01/26/2021 0.4  0.0 - 0.5 K/uL Final  . Basophils Relative 01/26/2021 0  % Final  . Basophils Absolute 01/26/2021 0.0  0.0 - 0.1 K/uL Final  . Immature Granulocytes 01/26/2021 0  % Final  . Abs Immature Granulocytes 01/26/2021 0.02  0.00 - 0.07 K/uL Final   Performed at [facility], [place] [address] [place], [place] [address]  . Hgb A1c MFr Bld 01/27/2021 5.8* 4.8 - 5.6 % Final   Comment: (NOTE) Pre diabetes:          5.7%-6.4%  Diabetes:              >6.4%  Glycemic control for   <7.0% adults with diabetes   . Mean Plasma Glucose 01/27/2021 119.76  mg/dL Final   Performed at [facility] [address] [place], [place] [address]  . Cholesterol 01/27/2021 122  0 - 200 mg/dL Final  . Triglycerides 01/27/2021 75  <150 mg/dL Final  . HDL 01/27/2021 40* >40 mg/dL Final  . Total CHOL/HDL Ratio 01/27/2021 3.1  RATIO Final  . VLDL 01/27/2021 15  0 - 40 mg/dL Final  . LDL Cholesterol 01/27/2021 67  0 - 99 mg/dL Final   Comment:        Total Cholesterol/HDL:CHD Risk Coronary Heart Disease Risk Table                     Men   Women  1/2 Average Risk   3.4   3.3  Average Risk       5.0   4.4  2 X Average Risk   9.6   7.1  3 X  Average Risk  23.4   11.0        Use the calculated Patient Ratio above and the CHD Risk Table to determine the patient's CHD Risk.  ATP III CLASSIFICATION (LDL):  <100     mg/dL   Optimal  100-129  mg/dL   Near or Above                    Optimal  130-159  mg/dL   Borderline  160-189  mg/dL   High  >190     mg/dL   Very High Performed at [facility] [address], [place], [place] [address]   Admission on 12/25/2020, Discharged on 12/25/2020  Component Date Value Ref Range Status  . Lipase 12/25/2020 29  11 - 51 U/L Final   Performed at [facility], [place] [address] [place], [place] [address]  . Sodium 12/25/2020 137  135 - 145 mmol/L Final  . Potassium 12/25/2020 3.8  3.5 - 5.1 mmol/L Final  . Chloride 12/25/2020 101  98 - 111 mmol/L Final  . CO2 12/25/2020 25  22 - 32 mmol/L Final  . Glucose, Bld 12/25/2020 125* 70 - 99 mg/dL Final   Glucose reference range applies only to samples taken after fasting for at least 8 hours.  . BUN 12/25/2020 10  6 - 20 mg/dL Final  . Creatinine, Ser 12/25/2020 1.17  0.61 - 1.24 mg/dL Final  . Calcium 12/25/2020 9.5  8.9 - 10.3 mg/dL Final  . Total Protein 12/25/2020 7.8  6.5 - 8.1 g/dL Final  . Albumin 12/25/2020 4.3  3.5 - 5.0 g/dL Final  . AST 12/25/2020 33  15 - 41 U/L Final  . ALT 12/25/2020 36  0 - 44 U/L Final  . Alkaline Phosphatase 12/25/2020 72  38 - 126 U/L Final  . Total Bilirubin 12/25/2020 0.8  0.3 - 1.2 mg/dL Final  . GFR, Estimated 12/25/2020 >60  >60 mL/min Final   Comment: (NOTE) Calculated using the CKD-EPI Creatinine Equation (2021)   . Anion gap 12/25/2020 11  5 - 15 Final   Performed at [facility] [address], [place], [place] [address]  . WBC 12/25/2020 14.0* 4.0 - 10.5 K/uL Final  . RBC 12/25/2020 4.92  4.22 - 5.81 MIL/uL Final  . Hemoglobin 12/25/2020 14.1  13.0 - 17.0 g/dL Final  . HCT 12/25/2020 44.9  39.0 - 52.0 % Final  . MCV 12/25/2020 91.3  80.0 - 100.0 fL Final   . MCH 12/25/2020 28.7  26.0 - 34.0 pg Final  . MCHC 12/25/2020 31.4  30.0 - 36.0 g/dL Final  . RDW 12/25/2020 13.7  11.5 - 15.5 % Final  . Platelets 12/25/2020 314  150 - 400 K/uL Final  . nRBC 12/25/2020 0.0  0.0 - 0.2 % Final   Performed at [facility], [place] [address], [place], [place] [address]  . Color, Urine 12/25/2020 YELLOW  YELLOW Final  . APPearance 12/25/2020 CLEAR  CLEAR Final  . Specific Gravity, Urine 12/25/2020 1.011  1.005 - 1.030 Final  . pH 12/25/2020 6.0  5.0 - 8.0 Final  . Glucose, UA 12/25/2020 NEGATIVE  NEGATIVE mg/dL Final  . Hgb urine dipstick 12/25/2020 NEGATIVE  NEGATIVE Final  . Bilirubin Urine 12/25/2020 NEGATIVE  NEGATIVE Final  . Ketones, ur 12/25/2020 NEGATIVE  NEGATIVE mg/dL Final  . Protein, ur 12/25/2020 NEGATIVE  NEGATIVE mg/dL Final  . Nitrite 12/25/2020 NEGATIVE  NEGATIVE Final  . Chalmers Guest 12/25/2020 NEGATIVE  NEGATIVE Final   Performed at [facility], [place] [address] [place],  [address]  . Opiates 12/25/2020 NONE DETECTED  NONE DETECTED Final  .  Cocaine 12/25/2020 NONE DETECTED  NONE DETECTED Final  . Benzodiazepines 12/25/2020 POSITIVE* NONE DETECTED Final  . Amphetamines 12/25/2020 NONE DETECTED  NONE DETECTED Final  . Tetrahydrocannabinol 12/25/2020 POSITIVE* NONE DETECTED Final  . Barbiturates 12/25/2020 NONE DETECTED  NONE DETECTED Final   Comment: (NOTE) DRUG SCREEN FOR MEDICAL PURPOSES ONLY.  IF CONFIRMATION IS NEEDED FOR ANY PURPOSE, NOTIFY LAB WITHIN 5 DAYS.  LOWEST DETECTABLE LIMITS FOR URINE DRUG SCREEN Drug Class                     Cutoff (ng/mL) Amphetamine and metabolites    1000 Barbiturate and metabolites    200 Benzodiazepine                 962 Tricyclics and metabolites     300 Opiates and metabolites        300 Cocaine and metabolites        300 THC                            50 Performed at [facility], [place] [address] [place], [place] [address]   . SARS Coronavirus 2 Ag 12/25/2020  NEGATIVE  NEGATIVE Final   Comment: (NOTE) SARS-CoV-2 antigen NOT DETECTED.   Negative results are presumptive.  Negative results do not preclude SARS-CoV-2 infection and should not be used as the sole basis for treatment or other patient management decisions, including infection  control decisions, particularly in the presence of clinical signs and  symptoms consistent with COVID-19, or in those who have been in contact with the virus.  Negative results must be combined with clinical observations, patient history, and epidemiological information. The expected result is Negative.  Fact Sheet for Patients: HandmadeRecipes.com.cy  Fact Sheet for Healthcare Providers: FuneralLife.at  This test is not yet approved or cleared by the [place] FDA and  has been authorized for detection and/or diagnosis of SARS-CoV-2 by FDA under an Emergency Use Authorization (EUA).  This EUA will remain in effect (meaning this test can be used) for the duration of  the COV                          ID-19 declaration under Section 564(b)(1) of the Act, 21 U.S.C. section 360bbb-3(b)(1), unless the authorization is terminated or revoked sooner.    Marland Kitchen SARS Coronavirus 2 12/25/2020 NEGATIVE  NEGATIVE Final   Comment: (NOTE) SARS-CoV-2 target nucleic acids are NOT DETECTED.  The SARS-CoV-2 RNA is generally detectable in upper and lower respiratory specimens during the acute phase of infection. Negative results do not preclude SARS-CoV-2 infection, do not rule out co-infections with other pathogens, and should not be used as the sole basis for treatment or other patient management decisions. Negative results must be combined with clinical observations, patient history, and epidemiological information. The expected result is Negative.  Fact Sheet for Patients: SugarRoll.be  Fact Sheet for Healthcare  Providers: https://www.woods-mathews.com/  This test is not yet approved or cleared by the [place] FDA and  has been authorized for detection and/or diagnosis of SARS-CoV-2 by FDA under an Emergency Use Authorization (EUA). This EUA will remain  in effect (meaning this test can be used) for the duration of the COVID-19 declaration under Se                          ction 564(b)(1)  of the Act, 21 U.S.C. section 360bbb-3(b)(1), unless the authorization is terminated or revoked sooner.  Performed at [facility], [place] [address], [place], [place] [address]   Admission on 12/21/2020, Discharged on 12/24/2020  Component Date Value Ref Range Status  . TSH 12/21/2020 1.372  0.350 - 4.500 uIU/mL Final   Comment: Performed by a 3rd Generation assay with a functional sensitivity of <=0.01 uIU/mL. Performed at [facility], [place] [address], [place], [place] [address]   . Hgb A1c MFr Bld 12/21/2020 5.9* 4.8 - 5.6 % Final   Comment: (NOTE) Pre diabetes:          5.7%-6.4%  Diabetes:              >6.4%  Glycemic control for   <7.0% adults with diabetes   . Mean Plasma Glucose 12/21/2020 122.63  mg/dL Final   Performed at [facility] [address] [place], [place] [address]  . Cholesterol 12/21/2020 133  0 - 200 mg/dL Final  . Triglycerides 12/21/2020 81  <150 mg/dL Final  . HDL 12/21/2020 45  >40 mg/dL Final  . Total CHOL/HDL Ratio 12/21/2020 3.0  RATIO Final  . VLDL 12/21/2020 16  0 - 40 mg/dL Final  . LDL Cholesterol 12/21/2020 72  0 - 99 mg/dL Final   Comment:        Total Cholesterol/HDL:CHD Risk Coronary Heart Disease Risk Table                     Men   Women  1/2 Average Risk   3.4   3.3  Average Risk       5.0   4.4  2 X Average Risk   9.6   7.1  3 X Average Risk  23.4   11.0        Use the calculated Patient Ratio above and the CHD Risk Table to determine the patient's CHD Risk.        ATP III CLASSIFICATION (LDL):  <100      mg/dL   Optimal  100-129  mg/dL   Near or Above                    Optimal  130-159  mg/dL   Borderline  160-189  mg/dL   High  >190     mg/dL   Very High Performed at [facility] [address], [place], [place] [address]   Admission on 12/16/2020, Discharged on 12/21/2020  Component Date Value Ref Range Status  . Sodium 12/16/2020 137  135 - 145 mmol/L Final  . Potassium 12/16/2020 4.3  3.5 - 5.1 mmol/L Final  . Chloride 12/16/2020 104  98 - 111 mmol/L Final  . CO2 12/16/2020 22  22 - 32 mmol/L Final  . Glucose, Bld 12/16/2020 100* 70 - 99 mg/dL Final   Glucose reference range applies only to samples taken after fasting for at least 8 hours.  . BUN 12/16/2020 18  6 - 20 mg/dL Final  . Creatinine, Ser 12/16/2020 1.43* 0.61 - 1.24 mg/dL Final  . Calcium 12/16/2020 9.4  8.9 - 10.3 mg/dL Final  . Total Protein 12/16/2020 8.4* 6.5 - 8.1 g/dL Final  . Albumin 12/16/2020 4.5  3.5 - 5.0 g/dL Final  . AST 12/16/2020 28  15 - 41 U/L Final  . ALT 12/16/2020 33  0 - 44 U/L Final  . Alkaline Phosphatase 12/16/2020 76  38 -  126 U/L Final  . Total Bilirubin 12/16/2020 0.5  0.3 - 1.2 mg/dL Final  . GFR, Estimated 12/16/2020 >60  >60 mL/min Final   Comment: (NOTE) Calculated using the CKD-EPI Creatinine Equation (2021)   . Anion gap 12/16/2020 11  5 - 15 Final   Performed at [facility], [place] [address], [place], [place] [address]  . Alcohol, Ethyl (B) 12/16/2020 <10  <10 mg/dL Final   Comment: (NOTE) Lowest detectable limit for serum alcohol is 10 mg/dL.  For medical purposes only. Performed at [facility], [place] [address], [place], [place] [address]   . WBC 12/16/2020 9.6  4.0 - 10.5 K/uL Final  . RBC 12/16/2020 4.91  4.22 - 5.81 MIL/uL Final  . Hemoglobin 12/16/2020 14.4  13.0 - 17.0 g/dL Final  . HCT 12/16/2020 44.3  39.0 - 52.0 % Final  . MCV 12/16/2020 90.2  80.0 - 100.0 fL Final  . MCH 12/16/2020 29.3  26.0 - 34.0 pg  Final  . MCHC 12/16/2020 32.5  30.0 - 36.0 g/dL Final  . RDW 12/16/2020 14.1  11.5 - 15.5 % Final  . Platelets 12/16/2020 290  150 - 400 K/uL Final  . nRBC 12/16/2020 0.0  0.0 - 0.2 % Final  . Neutrophils Relative % 12/16/2020 75  % Final  . Neutro Abs 12/16/2020 7.1  1.7 - 7.7 K/uL Final  . Lymphocytes Relative 12/16/2020 15  % Final  . Lymphs Abs 12/16/2020 1.4  0.7 - 4.0 K/uL Final  . Monocytes Relative 12/16/2020 8  % Final  . Monocytes Absolute 12/16/2020 0.8  0.1 - 1.0 K/uL Final  . Eosinophils Relative 12/16/2020 2  % Final  . Eosinophils Absolute 12/16/2020 0.2  0.0 - 0.5 K/uL Final  . Basophils Relative 12/16/2020 0  % Final  . Basophils Absolute 12/16/2020 0.0  0.0 - 0.1 K/uL Final  . Immature Granulocytes 12/16/2020 0  % Final  . Abs Immature Granulocytes 12/16/2020 0.03  0.00 - 0.07 K/uL Final   Performed at [facility], [place] [address] [place], [place] [address]  . Salicylate Lvl 56/21/3086 <7.0* 7.0 - 30.0 mg/dL Final   Performed at [facility] [address], [place], [place] [address]  . Acetaminophen (Tylenol), Serum 12/16/2020 <10* 10 - 30 ug/mL Final   Comment: (NOTE) Therapeutic concentrations vary significantly. A range of 10-30 ug/mL  may be an effective concentration for many patients. However, some  are best treated at concentrations outside of this range. Acetaminophen concentrations >150 ug/mL at 4 hours after ingestion  and >50 ug/mL at 12 hours after ingestion are often associated with  toxic reactions.  Performed at [facility], [place] [address] [place], [place] [address]   . Opiates 12/17/2020 NONE DETECTED  NONE DETECTED Final  . Cocaine 12/17/2020 NONE DETECTED  NONE DETECTED Final  . Benzodiazepines 12/17/2020 POSITIVE* NONE DETECTED Final  . Amphetamines 12/17/2020 NONE DETECTED  NONE DETECTED Final  . Tetrahydrocannabinol 12/17/2020 NONE DETECTED  NONE DETECTED Final  . Barbiturates  12/17/2020 NONE DETECTED  NONE DETECTED Final   Comment: (NOTE) DRUG SCREEN FOR MEDICAL PURPOSES ONLY.  IF CONFIRMATION IS NEEDED FOR ANY PURPOSE, NOTIFY LAB WITHIN 5 DAYS.  LOWEST DETECTABLE LIMITS FOR URINE DRUG SCREEN Drug Class                     Cutoff (ng/mL) Amphetamine and metabolites    1000 Barbiturate and metabolites    200  Benzodiazepine                 301 Tricyclics and metabolites     300 Opiates and metabolites        300 Cocaine and metabolites        300 THC                            50 Performed at [facility], [place] [address], [place], [place] [address]   . Color, Urine 12/17/2020 YELLOW  YELLOW Final  . APPearance 12/17/2020 CLEAR  CLEAR Final  . Specific Gravity, Urine 12/17/2020 1.018  1.005 - 1.030 Final  . pH 12/17/2020 5.0  5.0 - 8.0 Final  . Glucose, UA 12/17/2020 NEGATIVE  NEGATIVE mg/dL Final  . Hgb urine dipstick 12/17/2020 NEGATIVE  NEGATIVE Final  . Bilirubin Urine 12/17/2020 NEGATIVE  NEGATIVE Final  . Ketones, ur 12/17/2020 5* NEGATIVE mg/dL Final  . Protein, ur 12/17/2020 NEGATIVE  NEGATIVE mg/dL Final  . Nitrite 12/17/2020 NEGATIVE  NEGATIVE Final  . Chalmers Guest 12/17/2020 NEGATIVE  NEGATIVE Final   Performed at [facility], [place] [address] [place], [place] [address]  . Magnesium 12/16/2020 1.8  1.7 - 2.4 mg/dL Final   Performed at [facility] [address], [place], [place] [address]  . SARS Coronavirus 2 12/16/2020 NEGATIVE  NEGATIVE Final   Comment: (NOTE) SARS-CoV-2 target nucleic acids are NOT DETECTED.  The SARS-CoV-2 RNA is generally detectable in upper and lower respiratory specimens during the acute phase of infection. Negative results do not preclude SARS-CoV-2 infection, do not rule out co-infections with other pathogens, and should not be used as the sole basis for treatment or other patient management decisions. Negative results must be combined with clinical  observations, patient history, and epidemiological information. The expected result is Negative.  Fact Sheet for Patients: SugarRoll.be  Fact Sheet for Healthcare Providers: https://www.woods-mathews.com/  This test is not yet approved or cleared by the [place] FDA and  has been authorized for detection and/or diagnosis of SARS-CoV-2 by FDA under an Emergency Use Authorization (EUA). This EUA will remain  in effect (meaning this test can be used) for the duration of the COVID-19 declaration under Se                          ction 564(b)(1) of the Act, 21 U.S.C. section 360bbb-3(b)(1), unless the authorization is terminated or revoked sooner.  Performed at [facility], [place] [address], [place], [place] [address]   . Magnesium 12/17/2020 1.8  1.7 - 2.4 mg/dL Final   Performed at [facility] [address] [place], [place] [address]  . Magnesium 12/17/2020 2.4  1.7 - 2.4 mg/dL Final   Performed at [facility] [address], [place], [place] [address]  . Magnesium 12/17/2020 2.5* 1.7 - 2.4 mg/dL Final   Performed at [facility] [address] [place], [place] [address]  . Potassium 12/17/2020 4.2  3.5 - 5.1 mmol/L Final   Performed at [facility] [address] [place], [place] [address]  . Potassium 12/17/2020 4.5  3.5 - 5.1 mmol/L Final   Performed at [facility] [address], [place], [place] [address]  . Potassium 12/17/2020 3.8  3.5 - 5.1 mmol/L Final   Performed at [facility]  [address], [place], [place] [address]  . Potassium 12/17/2020 3.6  3.5 - 5.1 mmol/L Final   Performed at [facility] [address] [place] [address] [place], [place] [address]  . Potassium 12/17/2020 4.4  3.5 - 5.1 mmol/L Final   Comment: DELTA CHECK NOTED NO VISIBLE HEMOLYSIS REPEATED TO VERIFY Performed at  [facility] [address], [place], [place] [address]   . Sodium 12/18/2020 136  135 - 145 mmol/L Final  . Potassium 12/18/2020 3.8  3.5 - 5.1 mmol/L Final  . Chloride 12/18/2020 104  98 - 111 mmol/L Final  . CO2 12/18/2020 22  22 - 32 mmol/L Final  . Glucose, Bld 12/18/2020 90  70 - 99 mg/dL Final   Glucose reference range applies only to samples taken after fasting for at least 8 hours.  . BUN 12/18/2020 15  6 - 20 mg/dL Final  . Creatinine, Ser 12/18/2020 1.20  0.61 - 1.24 mg/dL Final  . Calcium 12/18/2020 8.8* 8.9 - 10.3 mg/dL Final  . Phosphorus 12/18/2020 3.3  2.5 - 4.6 mg/dL Final  . Albumin 12/18/2020 3.9  3.5 - 5.0 g/dL Final  . GFR, Estimated 12/18/2020 >60  >60 mL/min Final   Comment: (NOTE) Calculated using the CKD-EPI Creatinine Equation (2021)   . Anion gap 12/18/2020 10  5 - 15 Final   Performed at [facility], [place] [address], [place], [place] [address]  . Magnesium 12/18/2020 2.2  1.7 - 2.4 mg/dL Final   Performed at [facility] [address], [place], [place] [address]  . WBC 12/18/2020 7.4  4.0 - 10.5 K/uL Final  . RBC 12/18/2020 4.52  4.22 - 5.81 MIL/uL Final  . Hemoglobin 12/18/2020 13.4  13.0 - 17.0 g/dL Final  . HCT 12/18/2020 41.4  39.0 - 52.0 % Final  . MCV 12/18/2020 91.6  80.0 - 100.0 fL Final  . MCH 12/18/2020 29.6  26.0 - 34.0 pg Final  . MCHC 12/18/2020 32.4  30.0 - 36.0 g/dL Final  . RDW 12/18/2020 14.2  11.5 - 15.5 % Final  . Platelets 12/18/2020 235  150 - 400 K/uL Final  . nRBC 12/18/2020 0.0  0.0 - 0.2 % Final   Performed at [facility], [place] [address], [place], [place] [address]  . SARS Coronavirus 2 by RT PCR 12/20/2020 NEGATIVE  NEGATIVE Final   Comment: (NOTE) SARS-CoV-2 target nucleic acids are NOT DETECTED.  The SARS-CoV-2 RNA is generally detectable in upper respiratory specimens during the acute phase of infection. The lowest concentration of SARS-CoV-2  viral copies this assay can detect is 138 copies/mL. A negative result does not preclude SARS-Cov-2 infection and should not be used as the sole basis for treatment or other patient management decisions. A negative result may occur with  improper specimen collection/handling, submission of specimen other than nasopharyngeal swab, presence of viral mutation(s) within the areas targeted by this assay, and inadequate number of viral copies(<138 copies/mL). A negative result must be combined with clinical observations, patient history, and epidemiological information. The expected result is Negative.  Fact Sheet for Patients:  EntrepreneurPulse.com.au  Fact Sheet for Healthcare Providers:  IncredibleEmployment.be  This test is no                          t yet approved or cleared by the [place] FDA and  has been authorized for detection and/or diagnosis of SARS-CoV-2 by FDA under an  Emergency Use Authorization (EUA). This EUA will remain  in effect (meaning this test can be used) for the duration of the COVID-19 declaration under Section 564(b)(1) of the Act, 21 U.S.C.section 360bbb-3(b)(1), unless the authorization is terminated  or revoked sooner.      . Influenza A by PCR 12/20/2020 NEGATIVE  NEGATIVE Final  . Influenza B by PCR 12/20/2020 NEGATIVE  NEGATIVE Final   Comment: (NOTE) The Xpert Xpress SARS-CoV-2/FLU/RSV plus assay is intended as an aid in the diagnosis of influenza from Nasopharyngeal swab specimens and should not be used as a sole basis for treatment. Nasal washings and aspirates are unacceptable for Xpert Xpress SARS-CoV-2/FLU/RSV testing.  Fact Sheet for Patients: EntrepreneurPulse.com.au  Fact Sheet for Healthcare Providers: IncredibleEmployment.be  This test is not yet approved or cleared by the [place] FDA and has been authorized for detection and/or diagnosis of SARS-CoV-2  by FDA under an Emergency Use Authorization (EUA). This EUA will remain in effect (meaning this test can be used) for the duration of the COVID-19 declaration under Section 564(b)(1) of the Act, 21 U.S.C. section 360bbb-3(b)(1), unless the authorization is terminated or revoked.    Marland Kitchen Resp Syncytial Virus by PCR 12/20/2020 NEGATIVE  NEGATIVE Final   Comment: (NOTE) Fact Sheet for Patients: EntrepreneurPulse.com.au  Fact Sheet for Healthcare Providers: IncredibleEmployment.be  This test is not yet approved or cleared by the [place] FDA and has been authorized for detection and/or diagnosis of SARS-CoV-2 by FDA under an Emergency Use Authorization (EUA). This EUA will remain in effect (meaning this test can be used) for the duration of the COVID-19 declaration under Section 564(b)(1) of the Act, 21 U.S.C. section 360bbb-3(b)(1), unless the authorization is terminated or revoked.  Performed at [facility], [place] [address], [place], [place] [address]   . WBC 12/21/2020 8.9  4.0 - 10.5 K/uL Final  . RBC 12/21/2020 4.76  4.22 - 5.81 MIL/uL Final  . Hemoglobin 12/21/2020 14.0  13.0 - 17.0 g/dL Final  . HCT 12/21/2020 43.2  39.0 - 52.0 % Final  . MCV 12/21/2020 90.8  80.0 - 100.0 fL Final  . MCH 12/21/2020 29.4  26.0 - 34.0 pg Final  . MCHC 12/21/2020 32.4  30.0 - 36.0 g/dL Final  . RDW 12/21/2020 13.6  11.5 - 15.5 % Final  . Platelets 12/21/2020 263  150 - 400 K/uL Final  . nRBC 12/21/2020 0.0  0.0 - 0.2 % Final  . Neutrophils Relative % 12/21/2020 53  % Final  . Neutro Abs 12/21/2020 4.7  1.7 - 7.7 K/uL Final  . Lymphocytes Relative 12/21/2020 33  % Final  . Lymphs Abs 12/21/2020 2.9  0.7 - 4.0 K/uL Final  . Monocytes Relative 12/21/2020 10  % Final  . Monocytes Absolute 12/21/2020 0.9  0.1 - 1.0 K/uL Final  . Eosinophils Relative 12/21/2020 4  % Final  . Eosinophils Absolute 12/21/2020 0.4  0.0 - 0.5 K/uL Final  .  Basophils Relative 12/21/2020 0  % Final  . Basophils Absolute 12/21/2020 0.0  0.0 - 0.1 K/uL Final  . Immature Granulocytes 12/21/2020 0  % Final  . Abs Immature Granulocytes 12/21/2020 0.03  0.00 - 0.07 K/uL Final   Performed at [facility], [place] [address], [place], [place] [address]  . Sodium 12/21/2020 137  135 - 145 mmol/L Final  . Potassium 12/21/2020 4.1  3.5 - 5.1 mmol/L Final  . Chloride 12/21/2020 103  98 - 111 mmol/L Final  . CO2 12/21/2020  27  22 - 32 mmol/L Final  . Glucose, Bld 12/21/2020 102* 70 - 99 mg/dL Final   Glucose reference range applies only to samples taken after fasting for at least 8 hours.  . BUN 12/21/2020 18  6 - 20 mg/dL Final  . Creatinine, Ser 12/21/2020 1.20  0.61 - 1.24 mg/dL Final  . Calcium 12/21/2020 8.8* 8.9 - 10.3 mg/dL Final  . GFR, Estimated 12/21/2020 >60  >60 mL/min Final   Comment: (NOTE) Calculated using the CKD-EPI Creatinine Equation (2021)   . Anion gap 12/21/2020 7  5 - 15 Final   Performed at [facility], [place] [address] [place], [place] [address]  Admission on 10/14/2020, Discharged on 10/14/2020  Component Date Value Ref Range Status  . Lipase 10/14/2020 31  11 - 51 U/L Final   Performed at [facility], [place] [address], [place], [place] [address]  . Sodium 10/14/2020 140  135 - 145 mmol/L Final  . Potassium 10/14/2020 4.0  3.5 - 5.1 mmol/L Final  . Chloride 10/14/2020 107  98 - 111 mmol/L Final  . CO2 10/14/2020 23  22 - 32 mmol/L Final  . Glucose, Bld 10/14/2020 108* 70 - 99 mg/dL Final   Glucose reference range applies only to samples taken after fasting for at least 8 hours.  . BUN 10/14/2020 13  6 - 20 mg/dL Final  . Creatinine, Ser 10/14/2020 1.21  0.61 - 1.24 mg/dL Final  . Calcium 10/14/2020 9.2  8.9 - 10.3 mg/dL Final  . Total Protein 10/14/2020 8.3* 6.5 - 8.1 g/dL Final  . Albumin 10/14/2020 4.5  3.5 - 5.0 g/dL Final  . AST 10/14/2020 19  15 - 41 U/L Final  .  ALT 10/14/2020 26  0 - 44 U/L Final  . Alkaline Phosphatase 10/14/2020 74  38 - 126 U/L Final  . Total Bilirubin 10/14/2020 0.3  0.3 - 1.2 mg/dL Final  . GFR, Estimated 10/14/2020 >60  >60 mL/min Final   Comment: (NOTE) Calculated using the CKD-EPI Creatinine Equation (2021)   . Anion gap 10/14/2020 10  5 - 15 Final   Performed at [facility] [place], [place] [address], [place], [place] [address]  . WBC 10/14/2020 7.9  4.0 - 10.5 K/uL Final  . RBC 10/14/2020 4.78  4.22 - 5.81 MIL/uL Final  . Hemoglobin 10/14/2020 14.1  13.0 - 17.0 g/dL Final  . HCT 10/14/2020 44.0  39.0 - 52.0 % Final  . MCV 10/14/2020 92.1  80.0 - 100.0 fL Final  . MCH 10/14/2020 29.5  26.0 - 34.0 pg Final  . MCHC 10/14/2020 32.0  30.0 - 36.0 g/dL Final  . RDW 10/14/2020 14.7  11.5 - 15.5 % Final  . Platelets 10/14/2020 311  150 - 400 K/uL Final  . nRBC 10/14/2020 0.0  0.0 - 0.2 % Final   Performed at [facility], [place] [address], [place], [place] [address]  . Color, Urine 10/14/2020 STRAW* YELLOW Final  . APPearance 10/14/2020 CLEAR  CLEAR Final  . Specific Gravity, Urine 10/14/2020 1.005  1.005 - 1.030 Final  . pH 10/14/2020 8.0  5.0 - 8.0 Final  . Glucose, UA 10/14/2020 NEGATIVE  NEGATIVE mg/dL Final  . Hgb urine dipstick 10/14/2020 NEGATIVE  NEGATIVE Final  . Bilirubin Urine 10/14/2020 NEGATIVE  NEGATIVE Final  . Ketones, ur 10/14/2020 NEGATIVE  NEGATIVE mg/dL Final  . Protein, ur 10/14/2020 NEGATIVE  NEGATIVE mg/dL Final  . Nitrite 10/14/2020 NEGATIVE  NEGATIVE Final  .  Leukocytes,Ua 10/14/2020 NEGATIVE  NEGATIVE Final   Performed at [facility], [place] [address], [place], [place] [address]  . SARS Coronavirus 2 by RT PCR 10/14/2020 NEGATIVE  NEGATIVE Final   Comment: (NOTE) SARS-CoV-2 target nucleic acids are NOT DETECTED.  The SARS-CoV-2 RNA is generally detectable in upper respiratory specimens during the acute phase of infection. The lowest concentration of  SARS-CoV-2 viral copies this assay can detect is 138 copies/mL. A negative result does not preclude SARS-Cov-2 infection and should not be used as the sole basis for treatment or other patient management decisions. A negative result may occur with  improper specimen collection/handling, submission of specimen other than nasopharyngeal swab, presence of viral mutation(s) within the areas targeted by this assay, and inadequate number of viral copies(<138 copies/mL). A negative result must be combined with clinical observations, patient history, and epidemiological information. The expected result is Negative.  Fact Sheet for Patients:  EntrepreneurPulse.com.au  Fact Sheet for Healthcare Providers:  IncredibleEmployment.be  This test is no                          t yet approved or cleared by the [place] FDA and  has been authorized for detection and/or diagnosis of SARS-CoV-2 by FDA under an Emergency Use Authorization (EUA). This EUA will remain  in effect (meaning this test can be used) for the duration of the COVID-19 declaration under Section 564(b)(1) of the Act, 21 U.S.C.section 360bbb-3(b)(1), unless the authorization is terminated  or revoked sooner.      . Influenza A by PCR 10/14/2020 NEGATIVE  NEGATIVE Final  . Influenza B by PCR 10/14/2020 NEGATIVE  NEGATIVE Final   Comment: (NOTE) The Xpert Xpress SARS-CoV-2/FLU/RSV plus assay is intended as an aid in the diagnosis of influenza from Nasopharyngeal swab specimens and should not be used as a sole basis for treatment. Nasal washings and aspirates are unacceptable for Xpert Xpress SARS-CoV-2/FLU/RSV testing.  Fact Sheet for Patients: EntrepreneurPulse.com.au  Fact Sheet for Healthcare Providers: IncredibleEmployment.be  This test is not yet approved or cleared by the [place] FDA and has been authorized for detection and/or diagnosis of  SARS-CoV-2 by FDA under an Emergency Use Authorization (EUA). This EUA will remain in effect (meaning this test can be used) for the duration of the COVID-19 declaration under Section 564(b)(1) of the Act, 21 U.S.C. section 360bbb-3(b)(1), unless the authorization is terminated or revoked.  Performed at [facility], [place] [address], [place], [place] [address]   . Opiates 10/14/2020 NONE DETECTED  NONE DETECTED Final  . Cocaine 10/14/2020 NONE DETECTED  NONE DETECTED Final  . Benzodiazepines 10/14/2020 NONE DETECTED  NONE DETECTED Final  . Amphetamines 10/14/2020 NONE DETECTED  NONE DETECTED Final  . Tetrahydrocannabinol 10/14/2020 NONE DETECTED  NONE DETECTED Final  . Barbiturates 10/14/2020 NONE DETECTED  NONE DETECTED Final   Comment: (NOTE) DRUG SCREEN FOR MEDICAL PURPOSES ONLY.  IF CONFIRMATION IS NEEDED FOR ANY PURPOSE, NOTIFY LAB WITHIN 5 DAYS.  LOWEST DETECTABLE LIMITS FOR URINE DRUG SCREEN Drug Class                     Cutoff (ng/mL) Amphetamine and metabolites    1000 Barbiturate and metabolites    200 Benzodiazepine                 372 Tricyclics and metabolites     300 Opiates and metabolites  300 Cocaine and metabolites        300 THC                            50 Performed at [facility], [place] [address] [place], [place] [address]   Admission on 10/08/2020, Discharged on 10/08/2020  Component Date Value Ref Range Status  . WBC 10/08/2020 8.0  4.0 - 10.5 K/uL Final  . RBC 10/08/2020 4.82  4.22 - 5.81 MIL/uL Final  . Hemoglobin 10/08/2020 14.3  13.0 - 17.0 g/dL Final  . HCT 10/08/2020 44.2  39.0 - 52.0 % Final  . MCV 10/08/2020 91.7  80.0 - 100.0 fL Final  . MCH 10/08/2020 29.7  26.0 - 34.0 pg Final  . MCHC 10/08/2020 32.4  30.0 - 36.0 g/dL Final  . RDW 10/08/2020 14.6  11.5 - 15.5 % Final  . Platelets 10/08/2020 301  150 - 400 K/uL Final  . nRBC 10/08/2020 0.0  0.0 - 0.2 % Final   Performed at [facility], [place] [address] [place], [place] [address]  . Troponin I (High Sensitivity) 10/08/2020 9  <18 ng/L Final   Comment: (NOTE) Elevated high sensitivity troponin I (hsTnI) values and significant  changes across serial measurements may suggest ACS but many other  chronic and acute conditions are known to elevate hsTnI results.  Refer to the "Links" section for chest pain algorithms and additional  guidance. Performed at [facility], [place] [address] [place], [place] [address]   . Sodium 10/08/2020 139  135 - 145 mmol/L Final  . Potassium 10/08/2020 4.2  3.5 - 5.1 mmol/L Final  . Chloride 10/08/2020 107  98 - 111 mmol/L Final  . CO2 10/08/2020 23  22 - 32 mmol/L Final  . Glucose, Bld 10/08/2020 108* 70 - 99 mg/dL Final   Glucose reference range applies only to samples taken after fasting for at least 8 hours.  . BUN 10/08/2020 19  6 - 20 mg/dL Final  . Creatinine, Ser 10/08/2020 1.34* 0.61 - 1.24 mg/dL Final  . Calcium 10/08/2020 9.4  8.9 - 10.3 mg/dL Final  . Total Protein 10/08/2020 8.3* 6.5 - 8.1 g/dL Final  . Albumin 10/08/2020 4.5  3.5 - 5.0 g/dL Final  . AST 10/08/2020 25  15 - 41 U/L Final  . ALT 10/08/2020 30  0 - 44 U/L Final  . Alkaline Phosphatase 10/08/2020 67  38 - 126 U/L Final  . Total Bilirubin 10/08/2020 0.4  0.3 - 1.2 mg/dL Final  . GFR, Estimated 10/08/2020 >60  >60 mL/min Final   Comment: (NOTE) Calculated using the CKD-EPI Creatinine Equation (2021)   . Anion gap 10/08/2020 9  5 - 15 Final   Performed at [facility], [place] [address], [place], [place] [address]  . Lipase 10/08/2020 31  11 - 51 U/L Final   Performed at [facility], [place] [address] [place], [place] [address]  . D-Dimer, Quant 10/08/2020 0.27  0.00 - 0.50 ug/mL-FEU Final   Comment: (NOTE) At the manufacturer cut-off value of 0.5 g/mL FEU, this assay has a negative predictive value of 95-100%.This assay is intended for  use in conjunction with a clinical pretest probability (PTP) assessment model to exclude pulmonary embolism (PE) and deep venous thrombosis (DVT) in outpatients suspected of PE or DVT. Results should be correlated with clinical presentation. Performed at [facility], [place]  [address], [place], [place] [address]   . Troponin I (High Sensitivity) 10/08/2020 <2  <18 ng/L Final   Comment: (NOTE) Elevated high sensitivity troponin I (hsTnI) values and significant  changes across serial measurements may suggest ACS but many other  chronic and acute conditions are known to elevate hsTnI results.  Refer to the "Links" section for chest pain algorithms and additional  guidance. Performed at [facility], [place] [address], [place],  [address]     Allergies: Fish allergy, Shellfish allergy, and Shellfish allergy  PTA Medications: (Not in a hospital admission)   Medical Decision Making  Admit patient to [facility] for continuous assessment while awaiting inpatient bed to become available  -continue home medication -review labs    Recommendations  Based on my evaluation the patient does not appear to have an emergency medical condition.  Ophelia Shoulder, NP 02/24/21  2:12 AM

## 2021-02-24 NOTE — ED Notes (Signed)
Pt alert and oriented on the unit. Education, support, and encouragement provided. Discharge summary, medications and follow up appointments reviewed with pt. Suicide prevention resources provided. Pt's belongings in locker returned and belongings sheet signed. Pt denies SI/HI, A/VH, pain, or any concerns at this time. Pt ambulatory on and off unit. Pt discharged to lobby.

## 2021-02-24 NOTE — ED Provider Notes (Signed)
FBC/OBS ASAP Discharge Summary  Date and Time: 02/24/2021 10:21 AM  Name: Brian [name]  MRN:  [id number]   Discharge Diagnoses:  Final diagnoses:  Amphetamine use disorder, severe ([place])  Suicidal ideation    Evalaution:  Brian [name] 35 year old Caucasian male that presents for suicidal ideations due to methamphetamine use.  Currently he is denying suicidal or homicidal ideations.  Patient has an evaluation with [facility] scheduled (phone interview) 09;30 however patient reports he does not believe that he is able to return back he was recently accepted and discharged to early. Stated " I was there last month"   Patient is requesting his belongings.  We will make additional outpatient resources for [place] and [facility] available.  Patient to consider following up with alcohol drug services (ADS).  Support, encouragement and  reassurance was provided.  Per admission assessment note: Brian [name] is a 35 y/o male. Patient presented from [facility] with complaint of ongoing suicidal ideation. Patient report that he is depressed and disappointed in his inability to maintain a drug free lifestyle. When ask if he is experiencing any specific depressive symptoms patient became very upset with this Probation officer and started yelling stating "I have already been assessed by the doctor at [facility] ED and was told that I'm going to be admitted here. I'm suicidal and i'm going to fucking kill myself. I went to [facility] and came here yesterday and you guys assessed me.   Total Time spent with patient: 15 minutes  Past Psychiatric History:  Past Medical History:  Past Medical History:  Diagnosis Date  . Alcohol abuse   . Anxiety   . Bipolar 2 disorder ([place])   . Cocaine abuse ([place])   . Depression   . Kidney stones   . Myocardial infarction ([place])   . Narcotic abuse ([place])   . Peptic ulcer   . Polysubstance abuse ([place])   . Renal disorder    kidney stones    Past Surgical History:  Procedure Laterality  Date  . ESOPHAGOGASTRODUODENOSCOPY N/A 05/29/2014   Procedure: ESOPHAGOGASTRODUODENOSCOPY (EGD) ;  Surgeon: Beryle Beams, MD;  Location: [facility] ENDOSCOPY;  Service: Endoscopy;  Laterality: N/A;  check with Dr. Collene Mares about sedation type/timinng - I recommend MAC  . HAND SURGERY  2006  . LEFT HEART CATH AND CORONARY ANGIOGRAPHY N/A 06/21/2020   Procedure: LEFT HEART CATH AND CORONARY ANGIOGRAPHY;  Surgeon: Nelva Bush, MD;  Location: [place] CV LAB;  Service: Cardiovascular;  Laterality: N/A;   Family History:  Family History  Problem Relation Age of Onset  . Heart disease Father    Family Psychiatric History:  Social History:  Social History   Substance and Sexual Activity  Alcohol Use Not Currently     Social History   Substance and Sexual Activity  Drug Use Not Currently  . Types: Marijuana, Cocaine, IV, Methamphetamines   Comment: heroin and meth    Social History   Socioeconomic History  . Marital status: Single    Spouse name: Not on file  . Number of children: Not on file  . Years of education: Not on file  . Highest education level: Not on file  Occupational History  . Not on file  Tobacco Use  . Smoking status: Current Every Day Smoker    Packs/day: 0.50    Types: Cigarettes  . Smokeless tobacco: Never Used  Vaping Use  . Vaping Use: Never used  Substance and Sexual Activity  . Alcohol use: Not Currently  .  Drug use: Not Currently    Types: Marijuana, Cocaine, IV, Methamphetamines    Comment: heroin and meth  . Sexual activity: Not Currently  Other Topics Concern  . Not on file  Social History Narrative   ** Merged History Encounter **       Lives with a friend   Lost house and custody of son 9broke up with fiancee)   Nature conservation officer   Social Determinants of Radio broadcast assistant Strain: Not on file  Food Insecurity: Not on file  Transportation Needs: Not on file  Physical Activity: Not on file  Stress: Not on file  Social  Connections: Not on file   SDOH:  SDOH Screenings   Alcohol Screen: Low Risk   . Last Alcohol Screening Score (AUDIT): 0  Depression (PHQ2-9): Medium Risk  . PHQ-2 Score: 12  Financial Resource Strain: Not on file  Food Insecurity: Not on file  Housing: High Risk  . Last Housing Risk Score: 2  Physical Activity: Not on file  Social Connections: Not on file  Stress: Not on file  Tobacco Use: High Risk  . Smoking Tobacco Use: Current Every Day Smoker  . Smokeless Tobacco Use: Never Used  Transportation Needs: Not on file    Has this patient used any form of tobacco in the last 30 days? (Cigarettes, Smokeless Tobacco, Cigars, and/or Pipes) A prescription for an FDA-approved tobacco cessation medication was offered at discharge and the patient refused  Current Medications:  Current Facility-Administered Medications  Medication Dose Route Frequency Provider Last Rate Last Admin  . acetaminophen (TYLENOL) tablet 650 mg  650 mg Oral Q6H PRN Ajibola, Ene A, NP      . alum & mag hydroxide-simeth (MAALOX/MYLANTA) 200-200-20 MG/5ML suspension 30 mL  30 mL Oral Q4H PRN Ajibola, Ene A, NP      . hydrOXYzine (ATARAX/VISTARIL) tablet 25 mg  25 mg Oral TID PRN Ajibola, Ene A, NP      . magnesium hydroxide (MILK OF MAGNESIA) suspension 30 mL  30 mL Oral Daily PRN Ajibola, Ene A, NP      . pantoprazole (PROTONIX) EC tablet 40 mg  40 mg Oral BID Ajibola, Ene A, NP   40 mg at 02/23/21 2138  . QUEtiapine (SEROQUEL) tablet 200 mg  200 mg Oral QHS Ajibola, Ene A, NP   200 mg at 02/23/21 2137  . traZODone (DESYREL) tablet 50 mg  50 mg Oral QHS PRN Ajibola, Ene A, NP       Current Outpatient Medications  Medication Sig Dispense Refill  . buPROPion (WELLBUTRIN XL) 300 MG 24 hr tablet Take 300 mg by mouth daily.    . mirtazapine (REMERON) 30 MG tablet Take 30 mg by mouth at bedtime.    . ondansetron (ZOFRAN-ODT) 4 MG disintegrating tablet TAKE 1 TABLET (4 MG TOTAL) BY MOUTH EVERY EIGHT HOURS AS NEEDED  FOR NAUSEA OR VOMITING. (Patient not taking: Reported on 02/23/2021) 10 tablet 0  . PROTONIX 40 MG tablet Take 40 mg by mouth 2 (two) times daily.    . QUEtiapine (SEROQUEL) 200 MG tablet Take 1 tablet (200 mg total) by mouth at bedtime. 30 tablet 1  . terbinafine (LAMISIL AT) 1 % cream Apply 1 application topically 2 (two) times daily. (Patient not taking: Reported on 02/23/2021) 60 g 1    PTA Medications: (Not in a hospital admission)   Musculoskeletal  Strength & Muscle Tone: within normal limits Gait & Station: normal Patient leans: N/A  Psychiatric  Specialty Exam  Presentation  General Appearance: Appropriate for Environment  Eye Contact:Good  Speech:Pressured  Speech Volume:Increased  Handedness:Right   Mood and Affect  Mood:Irritable  Affect:Congruent   Thought Process  Thought Processes:Coherent  Descriptions of Associations:Intact  Orientation:Full (Time, Place and Person)  Thought Content:WDL  Diagnosis of Schizophrenia or Schizoaffective disorder in past: No    Hallucinations:Hallucinations: None  Ideas of Reference:None  Suicidal Thoughts:Suicidal Thoughts: Yes, Active (plan to overdose on meth)  Homicidal Thoughts:Homicidal Thoughts: No   Sensorium  Memory:Immediate Good; Remote Good; Recent Good  Judgment:Poor  Insight:Good   Executive Functions  Concentration:Good  Attention Span:Good  [place] of Knowledge:Good  Language:Good   Psychomotor Activity  Psychomotor Activity:Psychomotor Activity: Normal   Assets  Assets:Desire for Improvement; Physical Health   Sleep  Sleep:Sleep: Fair Number of Hours of Sleep: 6   Nutritional Assessment (For OBS and FBC admissions only) Has the patient had a weight loss or gain of 10 pounds or more in the last 3 months?: No Has the patient had a decrease in food intake/or appetite?: No Does the patient have dental problems?: No Does the patient have eating habits or behaviors  that may be indicators of an eating disorder including binging or inducing vomiting?: No Has the patient recently lost weight without trying?: No Has the patient been eating poorly because of a decreased appetite?: No Malnutrition Screening Tool Score: 0    Physical Exam  Physical Exam Vitals and nursing note reviewed.  Cardiovascular:     Rate and Rhythm: Normal rate and regular rhythm.  Psychiatric:        Attention and Perception: Attention normal.        Mood and Affect: Mood normal.        Speech: Speech normal.        Behavior: Behavior is agitated.        Thought Content: Thought content is not paranoid. Thought content does not include suicidal ideation. Thought content does not include suicidal plan.        Cognition and Memory: Cognition and memory normal.        Judgment: Judgment normal.    ROS Blood pressure 133/87, pulse 87, temperature 97.7 F (36.5 C), temperature source Oral, resp. rate 16, SpO2 99 %. There is no height or weight on file to calculate BMI.  Demographic Factors:  Male, Low socioeconomic status and Unemployed  Loss Factors: Decrease in vocational status and Financial problems/change in socioeconomic status  Historical Factors: Family history of mental illness or substance abuse  Risk Reduction Factors:   Positive coping skills or problem solving skills  Continued Clinical Symptoms:  Depression:   Comorbid alcohol abuse/dependence  Cognitive Features That Contribute To Risk:  Closed-mindedness    Suicide Risk:  Minimal: No identifiable suicidal ideation.  Patients presenting with no risk factors but with morbid ruminations; may be classified as minimal risk based on the severity of the depressive symptoms  Plan Of Care/Follow-up recommendations:  Activity:  as tolerated  Diet:  heart healthy   Disposition: Take all medications as prescribed. Keep all follow-up appointments as scheduled.  Do not consume alcohol or use illegal drugs  while on prescription medications. Report any adverse effects from your medications to your primary care provider promptly.  In the event of recurrent symptoms or worsening symptoms, call 911, a crisis hotline, or go to the nearest emergency department for evaluation.   Derrill [place], NP 02/24/2021, 10:21 AM

## 2021-02-24 NOTE — ED Notes (Signed)
Pt asleep with even and unlabored respirations. No distress or discomfort noted. Pt remains safe on the unit. Will continue to monitor. 

## 2021-02-24 NOTE — Progress Notes (Signed)
CSW was contacted by Myrlene Broker from [facility], who advised that the patient has completed an phone assessment and she would need his information faxed for review for placement. CSW faxed referral and discussed with the patient plans to fax information. The patient expressed concerns about leaving [facility] early recently and beliefs he would not be able to receive treatment. CSW spoke to [facility] again and she said they would still be able to review his information for placement.   Glennie [place], MSW, [place], LCAS-A Phone: [phone] Disposition/TOC

## 2021-02-24 NOTE — ED Notes (Signed)
Patient sleep, safety check complete

## 2021-02-24 NOTE — Discharge Instructions (Signed)
Take all medications as prescribed. Keep all follow-up appointments as scheduled.  Do not consume alcohol or use illegal drugs while on prescription medications. Report any adverse effects from your medications to your primary care provider promptly.  In the event of recurrent symptoms or worsening symptoms, call 911, a crisis hotline, or go to the nearest emergency department for evaluation.   

## 2021-04-18 ENCOUNTER — Other Ambulatory Visit: Payer: Self-pay

## 2021-04-18 ENCOUNTER — Ambulatory Visit (HOSPITAL_COMMUNITY)
Admission: EM | Admit: 2021-04-18 | Discharge: 2021-04-18 | Disposition: A | Discharge status: Home / Self Care | Payer: 59 | Attending: Behavioral Health | Admitting: Behavioral Health

## 2021-04-18 DIAGNOSIS — F3181 Bipolar II disorder: Secondary | ICD-10-CM

## 2021-04-18 MED ORDER — QUETIAPINE FUMARATE 200 MG PO TABS: 200.0000 mg | ORAL_TABLET | Freq: Two times a day (BID) | ORAL | 1 refills | Status: DC

## 2021-04-18 NOTE — Discharge Instructions (Signed)
Stop taking your Wellbutrin. You will now take Seroquel 200 mg twice a day Make a follow up appointment with in the next week.  Patient is instructed to take all prescribed medications as recommended. Report any side effects or adverse reactions to your outpatient psychiatrist. Patient is instructed to abstain from alcohol and illegal drugs while on prescription medications. In the event of worsening symptoms, patient is instructed to call the crisis hotline, 911, or go to the nearest emergency department for evaluation and treatment.

## 2021-04-18 NOTE — Progress Notes (Signed)
Patient is self-referred to the [facility].  He states that he is prescribed Wellbutrin and states that he feels like the medication is making him manic.  Patient states that he has stopped using methamphetamines after he has cardiac issues and stent surgery and states that he is currently living in a halfway house.  Patient states that he is not suicidal, homicidal or psychotic, he states that he just feels really hyped up since he stopped using methamphetamine and he is convinced that ir is the medications.  Patient presents as being very restless, hyperverbal ans overly animated at time.  He is unable to sit still.  Patient is routine.

## 2021-04-18 NOTE — ED Provider Notes (Signed)
[facility] Medical Screening Exam  Patient Name: Brian [name] MRN: [id number] Date of Evaluation: 04/18/21 Chief Complaint:   Diagnosis:  Final diagnoses:  Bipolar II disorder, moderate, hypomanic, with anxious distress, in partial remission ([place])    History of Present illness: Brian [name] is a 35 y.o. male with a PPHx significant for Polysubstance use disorder (EtOH, Opioid, Meth, Cocaine, THC), Bipolar 2, and over 40 hospitalizations. He presents due to "Manic symptoms." He reports that he has been clean for several months and been working for [employer]. He states that since he stopped using he has still had episodes of "Mania." He states that one episode happened while he was in a tree cutting branches. He states he lost feeling in his legs and became unable to speak. He states during this time he also went in and out of consciousness. He states that since he stopped using meth he has been abusing his Wellbutrin medication. He has been prescribed 300 mg daily but has been taking 2 doses daily. He reports he knows that he is using it to "feel something" since he is no longer using. He reports no SI, HI, or AVH.  Discussed with him that he should stop the Wellbutrin immediately as this can cause seizures and the 7 episodes he has had sound like possible seizures. Discussed will also change his Seroquel from QHS to BID. Discussed him calling [facility] tomorrow to reestablish outpatient services as he now has insurance since he does not want to be admitted. He reported understanding.  Psychiatric Specialty Exam  Presentation  General Appearance:Disheveled; Appropriate for Environment  Eye Contact:Fair  Speech:Pressured; Clear and Coherent  Speech Volume:Normal  Handedness:Right   Mood and Affect  Mood:Anxious  Affect:Congruent   Thought Process  Thought Processes:Coherent; Goal Directed  Descriptions of  Associations:Intact  Orientation:Full (Time, Place and Person)  Thought Content:WDL  Diagnosis of Schizophrenia or Schizoaffective disorder in past: No   Hallucinations:None  Ideas of Reference:None  Suicidal Thoughts:No With Plan  Homicidal Thoughts:No Without Plan   Sensorium  Memory:Immediate Fair; Recent Fair  Judgment:Poor  Insight:Fair   Executive Functions  Concentration:Fair  Attention Span:Fair  [place] of Knowledge:Good  Language:Good   Psychomotor Activity  Psychomotor Activity:Restlessness   Assets  Assets:Resilience; Desire for Improvement   Sleep  Sleep:Fair  Number of hours: 6   No data recorded  Physical Exam: Physical Exam Vitals and nursing note reviewed.  Constitutional:      General: He is not in acute distress.    Appearance: Normal appearance. He is normal weight. He is not ill-appearing or toxic-appearing.  HENT:     Head: Normocephalic and atraumatic.  Cardiovascular:     Rate and Rhythm: Tachycardia present.  Pulmonary:     Effort: Pulmonary effort is normal.  Musculoskeletal:        General: Normal range of motion.  Neurological:     Mental Status: He is alert.   Review of Systems  Constitutional:  Negative for chills and fever.  Respiratory:  Negative for cough and shortness of breath.   Cardiovascular:  Negative for chest pain.  Gastrointestinal:  Negative for abdominal pain, constipation, diarrhea, nausea and vomiting.  Neurological:  Negative for weakness and headaches.  Psychiatric/Behavioral:  Negative for depression, hallucinations, substance abuse (reports sober for 2-3 months) and suicidal ideas. The patient is nervous/anxious.   Blood pressure (!) 151/99, pulse (!) 104, temperature 99 F (37.2 C), temperature source Oral, resp. rate 18, height  $'4\' 8"'Y$  (1.422 m), weight 156 lb (70.8 kg), SpO2 98 %. Body mass index is 34.97 kg/m.  Musculoskeletal: Strength & Muscle Tone: within normal  limits Gait & Station: normal Patient leans: N/A   [facility] MSE Discharge Disposition for Follow up and Recommendations: Based on my evaluation the patient does not appear to have an emergency medical condition and can be discharged with resources and follow up care in outpatient services for Medication Management  -Stop Wellbutrin -Increase Seroquel to 200 mg BID -Make outpatient appointment next week  [name], MD 04/18/2021, 5:22 PM

## 2021-04-18 NOTE — Discharge Summary (Signed)
Brian [name] to be D/C'd Home per MD order. Discussed with the patient and all questions fully answered. An After Visit Summary was printed and given to the patient.  Patient escorted out, and D/C home via private auto.  Clois Dupes  04/18/2021 5:23 PM

## 2021-11-26 ENCOUNTER — Other Ambulatory Visit: Payer: Self-pay

## 2021-11-26 ENCOUNTER — Emergency Department (HOSPITAL_COMMUNITY): Payer: Self-pay

## 2021-11-26 ENCOUNTER — Encounter (HOSPITAL_COMMUNITY): Payer: Self-pay

## 2021-11-26 ENCOUNTER — Emergency Department (HOSPITAL_COMMUNITY)
Admission: EM | Admit: 2021-11-26 | Discharge: 2021-11-26 | Disposition: A | Discharge status: Home / Self Care | Payer: Self-pay | Attending: Emergency Medicine | Admitting: Emergency Medicine

## 2021-11-26 DIAGNOSIS — F1510 Other stimulant abuse, uncomplicated: Secondary | ICD-10-CM | POA: Insufficient documentation

## 2021-11-26 DIAGNOSIS — R202 Paresthesia of skin: Secondary | ICD-10-CM | POA: Insufficient documentation

## 2021-11-26 DIAGNOSIS — E876 Hypokalemia: Secondary | ICD-10-CM | POA: Insufficient documentation

## 2021-11-26 DIAGNOSIS — R079 Chest pain, unspecified: Secondary | ICD-10-CM | POA: Insufficient documentation

## 2021-11-26 DIAGNOSIS — R0602 Shortness of breath: Secondary | ICD-10-CM | POA: Insufficient documentation

## 2021-11-26 DIAGNOSIS — I2510 Atherosclerotic heart disease of native coronary artery without angina pectoris: Secondary | ICD-10-CM | POA: Insufficient documentation

## 2021-11-26 DIAGNOSIS — R739 Hyperglycemia, unspecified: Secondary | ICD-10-CM | POA: Insufficient documentation

## 2021-11-26 HISTORY — DX: Other stimulant abuse, uncomplicated: F15.10

## 2021-11-26 LAB — CBC
HCT: 38.8 % — ABNORMAL LOW (ref 39.0–52.0)
Hemoglobin: 13.1 g/dL (ref 13.0–17.0)
MCH: 30 pg (ref 26.0–34.0)
MCHC: 33.8 g/dL (ref 30.0–36.0)
MCV: 88.8 fL (ref 80.0–100.0)
Platelets: 273 10*3/uL (ref 150–400)
RBC: 4.37 MIL/uL (ref 4.22–5.81)
RDW: 13 % (ref 11.5–15.5)
WBC: 7.9 10*3/uL (ref 4.0–10.5)
nRBC: 0 % (ref 0.0–0.2)

## 2021-11-26 LAB — BASIC METABOLIC PANEL
Anion gap: 8 (ref 5–15)
BUN: 14 mg/dL (ref 6–20)
CO2: 25 mmol/L (ref 22–32)
Calcium: 9.4 mg/dL (ref 8.9–10.3)
Chloride: 104 mmol/L (ref 98–111)
Creatinine, Ser: 1.36 mg/dL — ABNORMAL HIGH (ref 0.61–1.24)
GFR, Estimated: >60 mL/min (ref >60)
Glucose, Bld: 114 mg/dL — ABNORMAL HIGH (ref 70–99)
Potassium: 3.4 mmol/L — ABNORMAL LOW (ref 3.5–5.1)
Sodium: 137 mmol/L (ref 135–145)

## 2021-11-26 LAB — TROPONIN I (HIGH SENSITIVITY)
Troponin I (High Sensitivity): 2 ng/L (ref <18)
Troponin I (High Sensitivity): 2 ng/L (ref <18)

## 2021-11-26 LAB — CK: Total CK: 412 U/L — ABNORMAL HIGH (ref 49–397)

## 2021-11-26 MED ORDER — LORAZEPAM 1 MG PO TABS
1.0000 mg | ORAL_TABLET | Freq: Once | ORAL | Status: AC
  Administered 2021-11-26: 1 mg via ORAL
  Filled 2021-11-26: qty 1

## 2021-11-26 NOTE — ED Provider Triage Note (Signed)
Emergency Medicine Provider Triage Evaluation Note  Brian [name] , a 36 y.o. male  was evaluated in triage.  Pt complains of numbness to the fingertips and toes.  This started 2 days ago.  He is now having numbness up to the knees and elbows.  No recent GI illness.  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  Last use was 2 days ago.  Some chest pain.  No nausea, vomiting, fever, chills, abdominal pain.  He describes as numbness as pins-and-needles.  Review of Systems  Positive:  Negative: See above   Physical Exam  BP (!) 123/102 (BP Location: Right Arm)    Pulse 85    Temp 98.8 F (37.1 C) (Oral)    Resp 20    Ht 5\' 7"  (1.702 m)    Wt 70.3 kg    SpO2 99%    BMI 24.28 kg/m  Gen:   Awake, no distress   Resp:  Normal effort  MSK:   Moves extremities without difficulty  Other:  5/5 strength to the upper and lower extremities.  No pronator drift.  Negative Spurling maneuver.  Subjective decrease numbness to the upper and lower extremities.  Medical Decision Making  Medically screening exam initiated at 4:53 PM.  Appropriate orders placed.  Brian [name] was informed that the remainder of the evaluation will be completed by another provider, this initial triage assessment does not replace that evaluation, and the importance of remaining in the ED until their evaluation is complete.     [name] [name], [place] 11/26/21 1655

## 2021-11-26 NOTE — Discharge Instructions (Addendum)
You were seen here today for evaluation of your numbness and tingling of your hands and feet. Your lab work and imaging were normal.  This is likely due to your meth use. Please stop using methamphetamines. Additional resources for substance use included in this discharge paperwork. Please return home and drink plenty of fluids. If you have any concern, new or worsening symptoms, please return to the ED.

## 2021-11-26 NOTE — ED Notes (Signed)
Lab to add on ck

## 2021-11-26 NOTE — ED Provider Notes (Signed)
[facility] DEPT Provider Note   CSN: [id number] Arrival date & time: 11/26/21  1628     History Chief Complaint  Patient presents with   Numbness    Brian [name] is a 36 y.o. male presents the emergency department for eval of some tingling to hands and feet.  Patient reports he is recently relapsed with methamphetamines after being clean for a year and has used daily for the past 10 days.  He reports this numbness/tingling feeling has been happening for the past 4 days unchanged.  Denies any weakness.  He denies any current IV drug use although used IV drug use 6 years ago.  He denies any body aches, reports mild chest pain with occasional shortness of breath.  He denies any nausea or diaphoresis.  He reports he has not been eating or drinking as well due to the meth.  He denies any other drug use other than methamphetamines with occasional THC use. Patient ports he has a history of coronary artery disease and bipolar type II although has been off his medication for the past month of Seroquel and gabapentin.  He has no known drug allergies.  Daily tobacco user along with snorting or oral consumption of meth.  Denies any current IV drug use. HPI    Home Medications Prior to Admission medications   Medication Sig Start Date End Date Taking? Authorizing Provider  mirtazapine (REMERON) 30 MG tablet Take 30 mg by mouth at bedtime.    [provider]  PROTONIX 40 MG tablet Take 40 mg by mouth 2 (two) times daily. 02/20/21   [provider]  QUEtiapine (SEROQUEL) 200 MG tablet Take 1 tablet (200 mg total) by mouth 2 (two) times daily. 04/18/21   [name], MD  FLUoxetine (PROZAC) 20 MG capsule Take 1 capsule (20 mg total) by mouth daily. For depression Patient not taking: Reported on 11/04/2019 04/01/18 05/04/20  Lindell Spar I, NP  sucralfate (CARAFATE) 1 g tablet Take 1 tablet (1 g total) by mouth 4 (four) times daily -  with meals and  at bedtime. Patient not taking: Reported on 11/04/2019 08/30/19 05/04/20  Joy, Helane Gunther, PA-C  traZODone (DESYREL) 50 MG tablet Take 1 tablet (50 mg total) by mouth at bedtime as needed for sleep. Patient not taking: Reported on 08/30/2019 03/31/18 08/30/19  Lindell Spar I, NP      Allergies    Fish allergy, Shellfish allergy, and Shellfish allergy    Review of Systems   Review of Systems  Constitutional:  Negative for chills and fever.  Respiratory:  Positive for shortness of breath. Negative for cough.   Cardiovascular:  Positive for chest pain. Negative for palpitations.  Gastrointestinal:  Negative for abdominal pain, constipation, diarrhea, nausea and vomiting.  Musculoskeletal:  Negative for myalgias.  Skin:  Negative for rash.  Neurological:  Positive for numbness. Negative for weakness.  All other systems reviewed and are negative.  Physical Exam Updated Vital Signs BP (!) 108/44 (BP Location: Right Arm)    Pulse 81    Temp 98.4 F (36.9 C) (Oral)    Resp 16    Ht 5\' 7"  (1.702 m)    Wt 70.3 kg    SpO2 99%    BMI 24.28 kg/m  Physical Exam Vitals and nursing note reviewed.  Constitutional:      General: He is not in acute distress.    Appearance: He is not toxic-appearing.     Comments: Anxious appearing  HENT:     Head: Normocephalic and atraumatic.     Mouth/Throat:     Mouth: Mucous membranes are moist.     Pharynx: No oropharyngeal exudate or posterior oropharyngeal erythema.  Eyes:     General: No scleral icterus.    Extraocular Movements: Extraocular movements intact.     Pupils: Pupils are equal, round, and reactive to light.  Cardiovascular:     Rate and Rhythm: Normal rate and regular rhythm.     Heart sounds: No murmur heard.    Comments: Palpable DP, PT, radial pulses.  Cap refill less than 2 seconds. Pulmonary:     Effort: Pulmonary effort is normal. No respiratory distress.     Breath sounds: Normal breath sounds.  Abdominal:     General: Abdomen is flat.  Bowel sounds are normal.     Palpations: Abdomen is soft.     Tenderness: There is no abdominal tenderness. There is no guarding or rebound.  Musculoskeletal:        General: No swelling, tenderness or deformity. Normal range of motion.     Cervical back: Normal range of motion.     Comments: No midline or paraspinal back tenderness to palpation.  No overlying skin changes noted.  Skin:    General: Skin is warm and dry.     Comments: Clean ACs.  Neurological:     General: No focal deficit present.     Mental Status: He is alert. Mental status is at baseline.     Cranial Nerves: No cranial nerve deficit.     Sensory: Sensory deficit present.     Motor: No weakness.     Gait: Gait normal.     Comments: Subjective decree sensation to the bilateral hands and bilateral feet.  No overlying skin changes or skin discoloration noted to the area.  Cap refill less than 2 seconds.  Psychiatric:        Behavior: Behavior is hyperactive. Behavior is not combative.    ED Results / Procedures / Treatments   Labs (all labs ordered are listed, but only abnormal results are displayed) Labs Reviewed  BASIC METABOLIC PANEL - Abnormal; Notable for the following components:      Result Value   Potassium 3.4 (*)    Glucose, Bld 114 (*)    Creatinine, Ser 1.36 (*)    All other components within normal limits  CBC - Abnormal; Notable for the following components:   HCT 38.8 (*)    All other components within normal limits  TROPONIN I (HIGH SENSITIVITY)  TROPONIN I (HIGH SENSITIVITY)    EKG None  Radiology DG Chest 2 View  Result Date: 11/26/2021 CLINICAL DATA:  chest pain EXAM: CHEST - 2 VIEW COMPARISON:  Chest x-ray 05/18/2021 FINDINGS: The heart and mediastinal contours are within normal limits. No focal consolidation. No pulmonary edema. No pleural effusion. No pneumothorax. No acute osseous abnormality. IMPRESSION: No active cardiopulmonary disease. Electronically Signed   By: Iven Finn  M.D.   On: 11/26/2021 18:12   CT Head Wo Contrast  Result Date: 11/26/2021 CLINICAL DATA:  Methamphetamine abuse, extremity numbness EXAM: CT HEAD WITHOUT CONTRAST TECHNIQUE: Contiguous axial images were obtained from the base of the skull through the vertex without intravenous contrast. RADIATION DOSE REDUCTION: This exam was performed according to the departmental dose-optimization program which includes automated exposure control, adjustment of the mA and/or kV according to patient size and/or use of iterative reconstruction technique. COMPARISON:  01/26/2021 FINDINGS: Brain: No acute  infarct or hemorrhage. Lateral ventricles and midline structures are unremarkable. No acute extra-axial fluid collections. No mass effect. Vascular: No hyperdense vessel or unexpected calcification. Skull: Normal. Negative for fracture or focal lesion. Sinuses/Orbits: No acute finding. Other: None. IMPRESSION: 1. Stable head CT, no acute intracranial process. Electronically Signed   By: Randa Ngo M.D.   On: 11/26/2021 17:21    Procedures Procedures   Medications Ordered in ED Medications - No data to display  ED Course/ Medical Decision Making/ A&P                           Medical Decision Making Amount and/or Complexity of Data Reviewed Labs: ordered.  Risk Prescription drug management.   36 year old male presents emerged department for evaluation of bilateral hand and feet numbness/tingling after methamphetamine use without weakness.  Differential diagnosis includes was not limited to radiculopathy, abscess, electrolyte imbalance, ACS, pneumonia, pneumothorax, PE, rhabdomyolysis.  Low suspicion for spinal abscess as the patient denies any IV drug use and only admits to methamphetamine use by snort or p.o.  Low suspicion for PE given the patient's meth use and mentions that his chest pain and short of breath really is not concerning to him as he typically has this with his methamphetamine use.  Head CT  and chest x-ray along with labs ordered in triage.  I added on a CK.  I had a 1 mg given for the patient or his hyperactivity and anxiousness.  I independently reviewed and interpreted the patient's lab and imaging.  BMP shows mild decrease potassium 3.4.  Mildly elevated glucose at 114.  Creatinine mildly at 1.36 although this is improved from patient's creatinine seventh ago.  CBC shows no signs of leukocytosis, slightly decreased hematocrit, otherwise no signs of anemia.  CK elevated 412.  Initial troponin of 2 with repeat of 2, delta 0.  I agree with radiologist findings with the chest x-ray and head CT showing no acute cardiopulmonary process of the chest CT showing no acute intracranial process.  EKG shows sinus rhythm with nonspecific ST abnormality.  Given the bilateral nature as well affecting both upper and lower extremities, this is most likely radiculopathy affecting her levels of the spinal cord.  Patient is on a white count or fever.  I suspect that this numbness/tingling is due to the patient's methamphetamine use as he is now having weakness.  Equal grip strength and is ambulatory with ease.  Low suspicion for ACS given the flat troponins and reassuring EKG. not concern for rhabdomyolysis given a moderately elevated CK no signs of pneumonia or pneumothorax visualized on chest x-ray.  Had a long discussion with the patient to discontinue his methamphetamine use as this can lead to sudden death.  I provided palpation and inpatient resources for substance use disorders.  Recommended the patient drink plenty of fluids.  Strict return precautions discussed.  Patient agrees with plan.  Patient is stable being discharged home in good condition.  Final Clinical Impression(s) / ED Diagnoses Final diagnoses:  Paresthesia  Methamphetamine use [facility])    Rx / DC Orders ED Discharge Orders     None         Sherrell Puller, PA-C 11/27/21 Evalee Jefferson    Blanchie Dessert, MD 11/28/21 [phone]

## 2021-11-26 NOTE — ED Notes (Signed)
Patient complaining of relapse. He states that both of his feet are numb and both hands finger tips. Patient states that he has had heart problems with meth use in the past.

## 2021-11-26 NOTE — ED Triage Notes (Signed)
"  Relapsed on meth 10 days ago and last usage was yesterday morning, The past 3 days my hands, arms, feet and toes have been numb and it is worrying me" per pt

## 2021-11-27 ENCOUNTER — Encounter (HOSPITAL_COMMUNITY): Payer: Self-pay

## 2021-11-27 ENCOUNTER — Other Ambulatory Visit: Payer: Self-pay

## 2021-11-27 ENCOUNTER — Emergency Department (HOSPITAL_COMMUNITY)
Admission: EM | Admit: 2021-11-27 | Discharge: 2021-11-28 | Disposition: A | Discharge status: Home / Self Care | Payer: 59 | Attending: Emergency Medicine | Admitting: Emergency Medicine

## 2021-11-27 DIAGNOSIS — F419 Anxiety disorder, unspecified: Secondary | ICD-10-CM | POA: Insufficient documentation

## 2021-11-27 DIAGNOSIS — T43655A Adverse effect of methamphetamines, initial encounter: Secondary | ICD-10-CM | POA: Diagnosis not present

## 2021-11-27 DIAGNOSIS — R202 Paresthesia of skin: Secondary | ICD-10-CM | POA: Diagnosis present

## 2021-11-27 DIAGNOSIS — T887XXA Unspecified adverse effect of drug or medicament, initial encounter: Secondary | ICD-10-CM

## 2021-11-27 DIAGNOSIS — F1510 Other stimulant abuse, uncomplicated: Secondary | ICD-10-CM | POA: Diagnosis not present

## 2021-11-27 NOTE — ED Triage Notes (Signed)
Patient arrived stating that he used meth yesterday, seen for same stating "it was bad dope." Reports feeling tingling and weird. Declines use since. States he has felt anxious since.

## 2021-11-28 ENCOUNTER — Ambulatory Visit (HOSPITAL_COMMUNITY)
Admission: RE | Admit: 2021-11-28 | Discharge: 2021-11-28 | Disposition: A | Discharge status: Home / Self Care | Payer: Self-pay | Attending: Psychiatry | Admitting: Psychiatry

## 2021-11-28 NOTE — ED Provider Notes (Signed)
[facility] DEPT Provider Note   CSN: [id number] Arrival date & time: 11/27/21  2252     History  Chief Complaint  Patient presents with   Addiction Problem    Brian [name] is a 36 y.o. male.  The history is provided by the patient and medical records. No language interpreter was used.   36 year old male significant history of polysubstance abuse including methamphetamine use who presents complaining of tearing sensation throughout his body.  Patient report he was clean from drug use for approximately 11 months but relapsed several days ago.  Since resuming using, he has been having tingling sensation throughout his body which makes him very anxious.  He was last seen in the ED for similar complaints 2 days ago and was subsequent discharged.  He admits to relapse using meth again and now having tingling sensation.  No SI or HI.  Denies any drug use.  Home Medications Prior to Admission medications   Medication Sig Start Date End Date Taking? Authorizing Provider  buPROPion (WELLBUTRIN XL) 300 MG 24 hr tablet Take 300 mg by mouth daily.    [provider]  gabapentin (NEURONTIN) 300 MG capsule Take 300 mg by mouth 3 (three) times daily.    [provider]  QUEtiapine (SEROQUEL) 200 MG tablet Take 1 tablet (200 mg total) by mouth 2 (two) times daily. Patient taking differently: Take 200 mg by mouth at bedtime. 04/18/21   [name], MD  FLUoxetine (PROZAC) 20 MG capsule Take 1 capsule (20 mg total) by mouth daily. For depression Patient not taking: Reported on 11/04/2019 04/01/18 05/04/20  Lindell Spar I, NP  sucralfate (CARAFATE) 1 g tablet Take 1 tablet (1 g total) by mouth 4 (four) times daily -  with meals and at bedtime. Patient not taking: Reported on 11/04/2019 08/30/19 05/04/20  Joy, Helane Gunther, PA-C  traZODone (DESYREL) 50 MG tablet Take 1 tablet (50 mg total) by mouth at bedtime as needed for sleep. Patient not taking: Reported  on 08/30/2019 03/31/18 08/30/19  Lindell Spar I, NP      Allergies    Fish allergy, Shellfish allergy, and Shellfish allergy    Review of Systems   Review of Systems  All other systems reviewed and are negative.  Physical Exam Updated Vital Signs BP 111/76    Pulse 81    Temp 98 F (36.7 C) (Oral)    Resp 16    SpO2 99%  Physical Exam Vitals and nursing note reviewed.  Constitutional:      General: He is not in acute distress.    Appearance: He is well-developed.  HENT:     Head: Atraumatic.  Eyes:     Conjunctiva/sclera: Conjunctivae normal.  Musculoskeletal:     Cervical back: Neck supple.  Skin:    Findings: No rash.  Neurological:     Mental Status: He is alert. Mental status is at baseline.    ED Results / Procedures / Treatments   Labs (all labs ordered are listed, but only abnormal results are displayed) Labs Reviewed - No data to display  EKG None  Radiology DG Chest 2 View  Result Date: 11/26/2021 CLINICAL DATA:  chest pain EXAM: CHEST - 2 VIEW COMPARISON:  Chest x-ray 05/18/2021 FINDINGS: The heart and mediastinal contours are within normal limits. No focal consolidation. No pulmonary edema. No pleural effusion. No pneumothorax. No acute osseous abnormality. IMPRESSION: No active cardiopulmonary disease. Electronically Signed   By: Clelia Croft.D.  On: 11/26/2021 18:12   CT Head Wo Contrast  Result Date: 11/26/2021 CLINICAL DATA:  Methamphetamine abuse, extremity numbness EXAM: CT HEAD WITHOUT CONTRAST TECHNIQUE: Contiguous axial images were obtained from the base of the skull through the vertex without intravenous contrast. RADIATION DOSE REDUCTION: This exam was performed according to the departmental dose-optimization program which includes automated exposure control, adjustment of the mA and/or kV according to patient size and/or use of iterative reconstruction technique. COMPARISON:  01/26/2021 FINDINGS: Brain: No acute infarct or hemorrhage. Lateral  ventricles and midline structures are unremarkable. No acute extra-axial fluid collections. No mass effect. Vascular: No hyperdense vessel or unexpected calcification. Skull: Normal. Negative for fracture or focal lesion. Sinuses/Orbits: No acute finding. Other: None. IMPRESSION: 1. Stable head CT, no acute intracranial process. Electronically Signed   By: Randa Ngo M.D.   On: 11/26/2021 17:21    Procedures Procedures    Medications Ordered in ED Medications - No data to display  ED Course/ Medical Decision Making/ A&P                           Medical Decision Making Problems Addressed: Methamphetamine abuse [facility]): chronic illness or injury    Details: -recommend seeking rehab -resources provided Side effect of drug: self-limited or minor problem    Details: -side effect of meth use -improvement of sxs after prolonged monitoring in the ER  Amount and/or Complexity of Data Reviewed External Data Reviewed: labs and notes.   BP 111/76    Pulse 81    Temp 98 F (36.7 C) (Oral)    Resp 16    SpO2 99%   6:28 AM Patient with significant history of methamphetamine use presents today with complaints of tingling sensation throughout body after use.  He was seen for similar complaint 2 days had a thorough work-up that I independently reviewed.  Patient has been monitored in the ED for the past 7 hours and at this time he feels better.  He requesting for something to eat.  I do believe his symptoms secondary to methamphetamine use.  I encouraged patient drug cessation and will provide resource.  At this time patient stable for discharge.        Final Clinical Impression(s) / ED Diagnoses Final diagnoses:  Side effect of drug  Methamphetamine abuse [facility])    Rx / DC Orders ED Discharge Orders     None         Domenic Moras, PA-C 52/84/13 2440    Delora Fuel, MD 08/22/24 [phone]
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
## 2021-11-28 NOTE — H&P (Signed)
Behavioral Health Medical Screening Exam  Brian [name] is a 36 YO male with frequent ER admission who was discharged from [facility] this morning for multiple substance use. Patient is seen, chart reviewed. The chart findings discussed with the treatment team and case discussed with Dr. Dwyane Dee.   On assessment patient is found laying down on the bench. When awaken, becomes restless, states he has been taking multiple illegal drugs including delta, methamphetamines, marijuana, and cocaine. He started using delta marijuana when he was 36 years old and methamphetamines when he was 36 years old. Last use of marijuana is today after being discharge from the emergency room and last use of methamphetamines was 2 days ago. Patient requested for food to eat and was provided with breakfast and water to drink. Reports that he was clean for 11 months but relapsed one month ago. Patient had previously been in [place], [place], [place] and [facility] for management and treatment of for substance use. States,triggers include using the drugs again. Endorsed aggressive behavior in the past but not currently. States, his has 2 best friends and a girlfriend who is now in drug recovery therapy as his support systems. Denies SI, HI, and AVH. Denies access and having firearms at home. When asked by this provider on what help we could provide for him today responded, "NO sure, I cannot sit still, and I want to stay here for a few days." Provider offered community resources for out-patient therapy however, patient refused. Call placed to [place] for drug treatment help, but was made aware that patient is not qualified for out-patient because he recently taken some stimulant. Patient was educated on side effects of illegal drugs in the system and how intensive outpatient therapy could help. He reluctantly took the community resources and asked for another meal before he left [facility].   Total Time spent with  patient: 30 minutes  Psychiatric Specialty Exam:  Presentation  General Appearance: Disheveled; Appropriate for Environment  Eye Contact:Fair  Speech:Pressured; Clear and Coherent  Speech Volume:Normal  Handedness:Right  Mood and Affect  Mood:Anxious  Affect:Congruent  Thought Process  Thought Processes:Coherent; Goal Directed  Descriptions of Associations:Intact  Orientation:Full (Time, Place and Person)  Thought Content:WDL  History of Schizophrenia/Schizoaffective disorder:No  Duration of Psychotic Symptoms:No data recorded Hallucinations:No data recorded Ideas of Reference:None  Suicidal Thoughts:No data recorded Homicidal Thoughts:No data recorded  Sensorium  Memory:Immediate Fair; Recent Fair  Judgment:Poor  Insight:Fair  Executive Functions  Concentration:Fair  Attention Span:Fair  [place]  Language:Good  Psychomotor Activity  Psychomotor Activity:No data recorded  Assets  Assets:Resilience; Desire for Improvement  Sleep  Sleep:No data recorded  Physical Exam: Physical Exam Vitals and nursing note reviewed.  HENT:     Head: Normocephalic and atraumatic.     Right Ear: External ear normal.     Left Ear: External ear normal.     Nose: Nose normal.     Mouth/Throat:     Pharynx: Oropharynx is clear.  Eyes:     Extraocular Movements: Extraocular movements intact.     Pupils: Pupils are equal, round, and reactive to light.  Cardiovascular:     Pulses: Normal pulses.  Pulmonary:     Effort: Pulmonary effort is normal.  Abdominal:     Palpations: Abdomen is soft.  Skin:    General: Skin is dry.  Neurological:     General: No focal deficit present.     Mental Status: He is alert and oriented to  person, place, and time.   Review of Systems  Constitutional: Negative.   HENT: Negative.    Eyes: Negative.   Cardiovascular: Negative.  Negative for chest pain, palpitations and orthopnea.       Pulse  rate-77  Skin: Negative.   There were no vitals taken for this visit. There is no height or weight on file to calculate BMI.  Musculoskeletal: Strength & Muscle Tone: within normal limits Gait & Station: normal Patient leans: N/A  Recommendations:  Based on my evaluation the patient does not appear to have an emergency medical condition. Nor require an inpatient hospitalization.  Laretta Bolster, FNP 11/28/2021, 11:11 AM

## 2021-12-09 ENCOUNTER — Emergency Department (HOSPITAL_COMMUNITY)
Admission: EM | Admit: 2021-12-09 | Discharge: 2021-12-10 | Disposition: A | Discharge status: Home / Self Care | Payer: 59 | Attending: Student | Admitting: Student

## 2021-12-09 ENCOUNTER — Encounter (HOSPITAL_COMMUNITY): Payer: Self-pay

## 2021-12-09 ENCOUNTER — Emergency Department (HOSPITAL_COMMUNITY): Payer: 59

## 2021-12-09 ENCOUNTER — Other Ambulatory Visit: Payer: Self-pay

## 2021-12-09 DIAGNOSIS — R1084 Generalized abdominal pain: Secondary | ICD-10-CM | POA: Insufficient documentation

## 2021-12-09 DIAGNOSIS — R309 Painful micturition, unspecified: Secondary | ICD-10-CM | POA: Diagnosis not present

## 2021-12-09 DIAGNOSIS — F17210 Nicotine dependence, cigarettes, uncomplicated: Secondary | ICD-10-CM | POA: Insufficient documentation

## 2021-12-09 DIAGNOSIS — K5900 Constipation, unspecified: Secondary | ICD-10-CM

## 2021-12-09 DIAGNOSIS — Z20822 Contact with and (suspected) exposure to covid-19: Secondary | ICD-10-CM | POA: Diagnosis not present

## 2021-12-09 DIAGNOSIS — R140 Abdominal distension (gaseous): Secondary | ICD-10-CM | POA: Insufficient documentation

## 2021-12-09 DIAGNOSIS — K219 Gastro-esophageal reflux disease without esophagitis: Secondary | ICD-10-CM | POA: Diagnosis not present

## 2021-12-09 DIAGNOSIS — R1030 Lower abdominal pain, unspecified: Secondary | ICD-10-CM

## 2021-12-09 LAB — URINALYSIS, ROUTINE W REFLEX MICROSCOPIC
Bilirubin Urine: NEGATIVE
Glucose, UA: NEGATIVE mg/dL
Hgb urine dipstick: NEGATIVE
Ketones, ur: NEGATIVE mg/dL
Leukocytes,Ua: NEGATIVE
Nitrite: NEGATIVE
Protein, ur: NEGATIVE mg/dL
Specific Gravity, Urine: 1.01 (ref 1.005–1.030)
pH: 6 (ref 5.0–8.0)

## 2021-12-09 LAB — CBC WITH DIFFERENTIAL/PLATELET
Abs Immature Granulocytes: 0.04 10*3/uL (ref 0.00–0.07)
Basophils Absolute: 0.1 10*3/uL (ref 0.0–0.1)
Basophils Relative: 0 %
Eosinophils Absolute: 0.7 10*3/uL — ABNORMAL HIGH (ref 0.0–0.5)
Eosinophils Relative: 5 %
HCT: 35.4 % — ABNORMAL LOW (ref 39.0–52.0)
Hemoglobin: 11.5 g/dL — ABNORMAL LOW (ref 13.0–17.0)
Immature Granulocytes: 0 %
Lymphocytes Relative: 14 %
Lymphs Abs: 1.8 10*3/uL (ref 0.7–4.0)
MCH: 29 pg (ref 26.0–34.0)
MCHC: 32.5 g/dL (ref 30.0–36.0)
MCV: 89.4 fL (ref 80.0–100.0)
Monocytes Absolute: 1.5 10*3/uL — ABNORMAL HIGH (ref 0.1–1.0)
Monocytes Relative: 11 %
Neutro Abs: 9.2 10*3/uL — ABNORMAL HIGH (ref 1.7–7.7)
Neutrophils Relative %: 70 %
Platelets: 294 10*3/uL (ref 150–400)
RBC: 3.96 MIL/uL — ABNORMAL LOW (ref 4.22–5.81)
RDW: 13.2 % (ref 11.5–15.5)
WBC: 13.2 10*3/uL — ABNORMAL HIGH (ref 4.0–10.5)
nRBC: 0 % (ref 0.0–0.2)

## 2021-12-09 LAB — BASIC METABOLIC PANEL
Anion gap: 7 (ref 5–15)
BUN: 16 mg/dL (ref 6–20)
CO2: 27 mmol/L (ref 22–32)
Calcium: 8.8 mg/dL — ABNORMAL LOW (ref 8.9–10.3)
Chloride: 102 mmol/L (ref 98–111)
Creatinine, Ser: 0.96 mg/dL (ref 0.61–1.24)
GFR, Estimated: >60 mL/min (ref >60)
Glucose, Bld: 112 mg/dL — ABNORMAL HIGH (ref 70–99)
Potassium: 4.1 mmol/L (ref 3.5–5.1)
Sodium: 136 mmol/L (ref 135–145)

## 2021-12-09 NOTE — ED Provider Triage Note (Signed)
Emergency Medicine Provider Triage Evaluation Note  Brian [name] , a 36 y.o. male  was evaluated in triage.  Pt complains of dysuria, urinary urgency and frequency in addition to bilateral flank pain.  Patient does have a history of kidney stones and has had a number of them in the past.  Patient was being treated for UTI and was on 5-day course of antibiotics with no improvement.  Here for worsening symptoms.  No fever or chills.  Review of Systems  Positive:  Negative: See above   Physical Exam  BP (!) 152/99 (BP Location: Left Arm)    Pulse (!) 118    Temp 98.4 F (36.9 C) (Oral)    Resp 17    Ht 5\' 8"  (1.727 m)    Wt 72.6 kg    SpO2 94%    BMI 24.33 kg/m  Gen:   Awake, no distress   Resp:  Normal effort  MSK:   Moves extremities without difficulty  Other:    Medical Decision Making  Medically screening exam initiated at 8:57 PM.  Appropriate orders placed.  Brian [name] was informed that the remainder of the evaluation will be completed by another provider, this initial triage assessment does not replace that evaluation, and the importance of remaining in the ED until their evaluation is complete.     Brian [name] [place], [place] 12/09/21 2058

## 2021-12-09 NOTE — ED Triage Notes (Signed)
Pt states that he has had painful urination x 5 days. Pt states that he feels like his prostate is swollen.

## 2021-12-10 ENCOUNTER — Emergency Department (HOSPITAL_COMMUNITY): Payer: 59

## 2021-12-10 LAB — RESP PANEL BY RT-PCR (FLU A&B, COVID) ARPGX2
Influenza A by PCR: NEGATIVE
Influenza B by PCR: NEGATIVE
SARS Coronavirus 2 by RT PCR: NEGATIVE

## 2021-12-10 LAB — RAPID HIV SCREEN (HIV 1/2 AB+AG)
HIV 1/2 Antibodies: NONREACTIVE
HIV-1 P24 Antigen - HIV24: NONREACTIVE

## 2021-12-10 LAB — TROPONIN I (HIGH SENSITIVITY): Troponin I (High Sensitivity): 12 ng/L (ref <18)

## 2021-12-10 MED ORDER — DICYCLOMINE HCL 20 MG PO TABS: 20.0000 mg | ORAL_TABLET | Freq: Two times a day (BID) | ORAL | 0 refills | Status: DC | PRN

## 2021-12-10 MED ORDER — DICYCLOMINE HCL 10 MG PO CAPS
20.0000 mg | ORAL_CAPSULE | Freq: Once | ORAL | Status: AC
Start: 2021-12-10 — End: 2021-12-10
  Administered 2021-12-10: 20 mg via ORAL
  Filled 2021-12-10: qty 2

## 2021-12-10 MED ORDER — POLYETHYLENE GLYCOL 3350 17 G PO PACK
17.0000 g | PACK | Freq: Every day | ORAL | Status: DC
  Administered 2021-12-10: 17 g via ORAL
  Filled 2021-12-10: qty 1

## 2021-12-10 MED ORDER — MAGNESIUM OXIDE -MG SUPPLEMENT 400 (240 MG) MG PO TABS: 800.0000 mg | ORAL_TABLET | Freq: Once | ORAL | Status: DC

## 2021-12-10 MED ORDER — POLYETHYLENE GLYCOL 3350 17 G PO PACK: 17.0000 g | PACK | Freq: Two times a day (BID) | ORAL | 0 refills | Status: DC

## 2021-12-10 NOTE — ED Provider Notes (Signed)
[facility] DEPT Provider Note  CSN: [id number] Arrival date & time: 12/09/21 1942  Chief Complaint(s) painful urination  HPI Brian [name] is a 36 y.o. male with PMH bipolar 2, methamphetamine use, MI, endocarditis, hep C who presents the emergency department for evaluation of urinary pain and abdominal distention.  Patient states that he has been in rehab for the last 2 weeks and is currently on Suboxone.  He has not used methamphetamine in the last 2 weeks or recently.  He states that he was on antibiotics for previous UTI and has completed this 5-day course.  He states that he feels that he cannot empty his bladder all the way.  Endorses generalized abdominal pain and states last bowel movement was 2 days ago.  Denies chest pain, shortness of breath, headache or other systemic symptoms.  Of note, he states that over the last 2 weeks he has had frequent night sweats where he needs to change his shirt.  HPI  Past Medical History Past Medical History:  Diagnosis Date   Alcohol abuse    Anxiety    Bipolar 2 disorder ([place])    Cocaine abuse ([place])    Depression    Kidney stones    Methamphetamine abuse, episodic ([place])    Myocardial infarction ([place])    Narcotic abuse ([place])    Peptic ulcer    Polysubstance abuse ([place])    Renal disorder    kidney stones   Patient Active Problem List   Diagnosis Date Noted   Methamphetamine use ([place])    Bipolar II disorder ([place]) 12/21/2020   Alcohol abuse 12/17/2020   PUD (peptic ulcer disease) 12/17/2020   Drug overdose 12/17/2020   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[place]) 10/10/2020   Gastroesophageal reflux disease 10/10/2020   Bupropion overdose 08/13/2020   Substance use disorder 08/13/2020   Suicidal ideation 08/13/2020   Pericarditis 07/02/2020   Depression 07/02/2020   Elevated troponin    Non-ST elevation (NSTEMI) myocardial infarction [facility])    Chest pain 06/19/2020   Psychoactive substance-induced psychosis  ([place]) 06/04/2020   Malingering 06/04/2020   Paranoia ([place])    Amphetamine use disorder, severe, 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   Substance induced mood disorder ([place]) 03/26/2018   Bipolar II disorder, severe, depressed, with anxious distress ([place]) 03/25/2018   Cellulitis 05/20/2015   Dysuria 05/20/2015   Smoker 05/20/2015   Homeless single person 05/20/2015   Drug abuse ([place])    Opioid use disorder, severe, dependence ([place]) 03/08/2015   Ileus of unspecified type ([place]) 05/28/2014   Abdominal pain 05/28/2014   AKI (acute kidney injury) ([place]) 05/28/2014   Esophagitis, acute 05/28/2014   Polysubstance abuse ([place]) 05/28/2014   Hematemesis 05/28/2014   Home Medication(s) Prior to Admission medications   Medication Sig Start Date End Date Taking? Authorizing Provider  buPROPion (WELLBUTRIN XL) 300 MG 24 hr tablet Take 300 mg by mouth daily.    [provider]  gabapentin (NEURONTIN) 300 MG capsule Take 300 mg by mouth 3 (three) times daily.    [provider]  QUEtiapine (SEROQUEL) 200 MG tablet Take 1 tablet (200 mg total) by mouth 2 (two) times daily. Patient taking differently: Take 200 mg by mouth at bedtime. 04/18/21   [name], MD  FLUoxetine (PROZAC) 20 MG capsule Take 1 capsule (20 mg total) by mouth daily. For depression Patient not taking: Reported on 11/04/2019 04/01/18 05/04/20  Lindell Spar I, NP  sucralfate (CARAFATE) 1 g tablet Take 1 tablet (1 g total)  by mouth 4 (four) times daily -  with meals and at bedtime. Patient not taking: Reported on 11/04/2019 08/30/19 05/04/20  Joy, Helane Gunther, PA-C  traZODone (DESYREL) 50 MG tablet Take 1 tablet (50 mg total) by mouth at bedtime as needed for sleep. Patient not taking: Reported on 08/30/2019 03/31/18 08/30/19  Encarnacion Slates, NP                                                                                                                                    Past Surgical History Past Surgical History:  Procedure  Laterality Date   ESOPHAGOGASTRODUODENOSCOPY N/A 05/29/2014   Procedure: ESOPHAGOGASTRODUODENOSCOPY (EGD) ;  Surgeon: Beryle Beams, MD;  Location: [facility] ENDOSCOPY;  Service: Endoscopy;  Laterality: N/A;  check with Dr. Collene Mares about sedation type/timinng - I recommend MAC   HAND SURGERY  2006   LEFT HEART CATH AND CORONARY ANGIOGRAPHY N/A 06/21/2020   Procedure: LEFT HEART CATH AND CORONARY ANGIOGRAPHY;  Surgeon: Nelva Bush, MD;  Location: [facility];  Service: Cardiovascular;  Laterality: N/A;   Family History Family History  Problem Relation Age of Onset   Heart disease Father     Social History Social History   Tobacco Use   Smoking status: Every Day    Packs/day: 0.50    Types: Cigarettes   Smokeless tobacco: Never  Vaping Use   Vaping Use: Never used  Substance Use Topics   Alcohol use: Not Currently   Drug use: Yes    Types: Marijuana, Cocaine, IV, Methamphetamines    Comment: "meth yesterday morning" per pt   Allergies Fish allergy, Shellfish allergy, and Shellfish allergy  Review of Systems Review of Systems  Constitutional:  Positive for diaphoresis and fever.  Gastrointestinal:  Positive for abdominal distention and constipation.  Genitourinary:  Positive for dysuria.   Physical Exam Vital Signs  I have reviewed the triage vital signs BP 137/81 (BP Location: Right Arm)    Pulse (!) 111    Temp 99.9 F (37.7 C) (Oral)    Resp 16    Ht _0  (1.727 m)    Wt 72.6 kg    SpO2 97%    BMI 24.33 kg/m   Physical Exam Vitals and nursing note reviewed.  Constitutional:      General: He is not in acute distress.    Appearance: He is well-developed.  HENT:     Head: Normocephalic and atraumatic.  Eyes:     Conjunctiva/sclera: Conjunctivae normal.  Cardiovascular:     Rate and Rhythm: Normal rate and regular rhythm.     Heart sounds: No murmur heard. Pulmonary:     Effort: Pulmonary effort is normal. No respiratory distress.     Breath sounds: Normal  breath sounds.  Abdominal:     General: There is distension.     Palpations: Abdomen is soft.     Tenderness: There is abdominal tenderness.  Musculoskeletal:        General: No swelling.     Cervical back: Neck supple.  Skin:    General: Skin is warm and dry.     Capillary Refill: Capillary refill takes less than 2 seconds.  Neurological:     Mental Status: He is alert.  Psychiatric:        Mood and Affect: Mood normal.     Comments: Accelerated speech    ED Results and Treatments Labs (all labs ordered are listed, but only abnormal results are displayed) Labs Reviewed  CBC WITH DIFFERENTIAL/PLATELET - Abnormal; Notable for the following components:      Result Value   WBC 13.2 (*)    RBC 3.96 (*)    Hemoglobin 11.5 (*)    HCT 35.4 (*)    Neutro Abs 9.2 (*)    Monocytes Absolute 1.5 (*)    Eosinophils Absolute 0.7 (*)    All other components within normal limits  BASIC METABOLIC PANEL - Abnormal; Notable for the following components:   Glucose, Bld 112 (*)    Calcium 8.8 (*)    All other components within normal limits  URINE CULTURE  URINALYSIS, ROUTINE W REFLEX MICROSCOPIC                                                                                                                          Radiology CT Renal Stone Study  Result Date: 12/09/2021 CLINICAL DATA:  Nephrolithiasis.  Dysuria.  Bilateral flank pain. EXAM: CT ABDOMEN AND PELVIS WITHOUT CONTRAST TECHNIQUE: Multidetector CT imaging of the abdomen and pelvis was performed following the standard protocol without IV contrast. RADIATION DOSE REDUCTION: This exam was performed according to the departmental dose-optimization program which includes automated exposure control, adjustment of the mA and/or kV according to patient size and/or use of iterative reconstruction technique. COMPARISON:  06/19/2020 FINDINGS: Lower chest: Ground-glass nodular densities seen in the right middle lobe and right lower lobe. Left lung  clear. No effusions. Hepatobiliary: No focal hepatic abnormality. Gallbladder unremarkable. Pancreas: No focal abnormality or ductal dilatation. Spleen: No focal abnormality.  Normal size. Adrenals/Urinary Tract: 2-3 mm nonobstructing stone in the lower pole of the right kidney. No ureteral stones or hydronephrosis. Adrenal glands and urinary bladder unremarkable. Stomach/Bowel: Large stool burden throughout the colon. Prior appendectomy. Stomach and small bowel grossly unremarkable. No evidence of bowel obstruction. Vascular/Lymphatic: No evidence of aneurysm or adenopathy. Reproductive: No visible focal abnormality. Other: No free fluid or free air. Musculoskeletal: No acute bony abnormality. IMPRESSION: Punctate right lower pole nephrolithiasis. No ureteral stones or hydronephrosis. Scattered ground-glass nodular densities in the right middle lobe and right lower lobe. Favor infectious/inflammatory process. Large stool burden throughout the colon. Electronically Signed   By: Rolm Baptise M.D.   On: 12/09/2021 22:03    Pertinent labs & imaging results that were available during my care of the patient were reviewed by me and considered in my medical decision making (see MDM for  details).  Medications Ordered in ED Medications - No data to display                                                                                                                                   Procedures Procedures  (including critical care time)  Medical Decision Making / ED Course   This patient presents to the ED for concern of abdominal distention, urinary pain and night sweats, this involves an extensive number of treatment options, and is a complaint that carries with it a high risk of complications and morbidity.  The differential diagnosis includes UTI, obstruction, constipation, endocarditis, HIV, malignancy  MDM: Patient seen in the emergency department for evaluation of multiple complaints as described  above.  Physical exam reveals abdominal distention and mild tenderness in left lower quadrant.  Laboratory valuation with a leukocytosis to 13.2, hemoglobin 11.5 but creatinine normal at 0.96.  Urinalysis unremarkable.  COVID flu negative.  High-sensitivity troponin unremarkable.  ECG with no evidence of pericarditis, nonischemic.  Chest x-ray with likely pneumonitis versus bronchiolitis.  No antibiotics indicated for this.  CT stone study with large volume stool likely opioid-induced constipation as the patient is currently on Suboxone.  Patient given Bentyl and MiraLAX and on reevaluation states that his symptoms are improved and that he will go home and attempt to have a bowel movement.  In the setting of the patient's night sweats and previous history of IV drug abuse, blood cultures were obtained as well as a rapid HIV and these are pending.  Patient will be notified of positive tests if they arise.  Patient then discharged with Bentyl and MiraLAX.   Additional history obtained:  -External records from outside source obtained and reviewed including: Chart review including previous notes, labs, imaging, consultation notes   Lab Tests: -I ordered, reviewed, and interpreted labs.   The pertinent results include:   Labs Reviewed  CBC WITH DIFFERENTIAL/PLATELET - Abnormal; Notable for the following components:      Result Value   WBC 13.2 (*)    RBC 3.96 (*)    Hemoglobin 11.5 (*)    HCT 35.4 (*)    Neutro Abs 9.2 (*)    Monocytes Absolute 1.5 (*)    Eosinophils Absolute 0.7 (*)    All other components within normal limits  BASIC METABOLIC PANEL - Abnormal; Notable for the following components:   Glucose, Bld 112 (*)    Calcium 8.8 (*)    All other components within normal limits  URINE CULTURE  URINALYSIS, ROUTINE W REFLEX MICROSCOPIC      EKG   EKG Interpretation  Date/Time:  Tuesday December 10 2021 10:49:20 EST Ventricular Rate:  93 PR Interval:  142 QRS Duration: 84 QT  Interval:  341 QTC Calculation: 425 R Axis:   89 Text Interpretation: Sinus rhythm Confirmed by Chris Cripps ([id number]) on 12/10/2021 12:39:40 PM  Imaging Studies ordered: I ordered imaging studies including CXR I independently visualized and interpreted imaging. I agree with the radiologist interpretation   Medicines ordered and prescription drug management: No orders of the defined types were placed in this encounter.   -I have reviewed the patients home medicines and have made adjustments as needed  Critical interventions none   Cardiac Monitoring: The patient was maintained on a cardiac monitor.  I personally viewed and interpreted the cardiac monitored which showed an underlying rhythm of: NSR  Social Determinants of Health:  Factors impacting patients care include: Polysubstance abuse history with chronic therapy   Reevaluation: After the interventions noted above, I reevaluated the patient and found that they have :improved  Co morbidities that complicate the patient evaluation  Past Medical History:  Diagnosis Date   Alcohol abuse    Anxiety    Bipolar 2 disorder ([place])    Cocaine abuse ([place])    Depression    Kidney stones    Methamphetamine abuse, episodic ([place])    Myocardial infarction ([place])    Narcotic abuse ([place])    Peptic ulcer    Polysubstance abuse ([place])    Renal disorder    kidney stones      Dispostion: I considered admission for this patient, but patient work-up here relatively benign and consistent with constipation.  Patient does not meet admission criteria.     Final Clinical Impression(s) / ED Diagnoses Final diagnoses:  None     _0 @    Teressa Lower, MD 12/10/21 1241

## 2021-12-11 LAB — URINE CULTURE: Culture: NO GROWTH

## 2021-12-15 LAB — CULTURE, BLOOD (ROUTINE X 2)
Culture: NO GROWTH
Culture: NO GROWTH
Special Requests: ADEQUATE
Special Requests: ADEQUATE

## 2022-03-04 ENCOUNTER — Ambulatory Visit (HOSPITAL_COMMUNITY)
Admission: EM | Admit: 2022-03-04 | Discharge: 2022-03-04 | Disposition: A | Discharge status: Home / Self Care | Payer: No Payment, Other

## 2022-03-04 NOTE — BH Assessment (Signed)
Pt learned that his girlfriend has been entertaining other men her DM. He relapsed about 4 months ago on meth, used $20 last night. Having SI with plan to "blow my fucking brain out". ? ?Pt is emergent  ?

## 2022-03-25 ENCOUNTER — Emergency Department (HOSPITAL_COMMUNITY)
Admission: EM | Admit: 2022-03-25 | Discharge: 2022-03-25 | Disposition: A | Discharge status: Home / Self Care | Payer: PRIVATE HEALTH INSURANCE | Attending: Emergency Medicine | Admitting: Emergency Medicine

## 2022-03-25 ENCOUNTER — Telehealth (HOSPITAL_COMMUNITY): Payer: Self-pay | Admitting: *Deleted

## 2022-03-25 ENCOUNTER — Encounter (HOSPITAL_COMMUNITY): Payer: Self-pay

## 2022-03-25 ENCOUNTER — Other Ambulatory Visit: Payer: Self-pay

## 2022-03-25 DIAGNOSIS — F1910 Other psychoactive substance abuse, uncomplicated: Secondary | ICD-10-CM | POA: Insufficient documentation

## 2022-03-25 NOTE — BH Assessment (Signed)
Care Management - [place] Follow Up Discharges   Writer attempted to make contact with patient today and was unsuccessful.  Voicemail is not set up.

## 2022-03-25 NOTE — ED Triage Notes (Addendum)
Pt states that he is doing drugs that he hasn't done in 7 years, pt reports doing alcohol and Fentanyl. Last alcohol 2 hrs ago, 2 shots of Vodka. Last Fentanyl use yesterday around 5 pm. Pt wants to detox. Pt states that he was clean for 4 years but over the past year he has been off and on using drugs and alcohol.

## 2022-03-25 NOTE — ED Provider Notes (Signed)
[place] DEPT  Provider Note  CSN: [id number] Arrival date & time: 03/25/22 0011  History No chief complaint on file.   Brian [name] is a 36 y.o. male with long history of substance abuse has been in and out of treatment and had a 4 year span of sobriety until he relapsed a few months ago. Currently using alcohol and snorting fentanyl. He is looking for detox, not necessarily interested in long term residential treatment. He denies any SI but has depressed mood. He denies any acute medical concerns.    Home Medications Prior to Admission medications   Medication Sig Start Date End Date Taking? Authorizing Provider  buPROPion (WELLBUTRIN XL) 300 MG 24 hr tablet Take 300 mg by mouth daily.    [provider]  dicyclomine (BENTYL) 20 MG tablet Take 1 tablet (20 mg total) by mouth 2 (two) times daily as needed for spasms (abdominal cramping). 12/10/21   Kommor, [place], MD  gabapentin (NEURONTIN) 300 MG capsule Take 300 mg by mouth 3 (three) times daily.    [provider]  polyethylene glycol (MIRALAX) 17 g packet Take 17 g by mouth 2 (two) times daily. 12/10/21   Kommor, Madison, MD  QUEtiapine (SEROQUEL) 200 MG tablet Take 1 tablet (200 mg total) by mouth 2 (two) times daily. Patient taking differently: Take 200 mg by mouth at bedtime. 04/18/21   [name], MD  FLUoxetine (PROZAC) 20 MG capsule Take 1 capsule (20 mg total) by mouth daily. For depression Patient not taking: Reported on 11/04/2019 04/01/18 05/04/20  Lindell Spar I, NP  sucralfate (CARAFATE) 1 g tablet Take 1 tablet (1 g total) by mouth 4 (four) times daily -  with meals and at bedtime. Patient not taking: Reported on 11/04/2019 08/30/19 05/04/20  Joy, Helane Gunther, PA-C  traZODone (DESYREL) 50 MG tablet Take 1 tablet (50 mg total) by mouth at bedtime as needed for sleep. Patient not taking: Reported on 08/30/2019 03/31/18 08/30/19  Lindell Spar I, NP     Allergies    Fish  allergy, Shellfish allergy, and Shellfish allergy   Review of Systems   Review of Systems Please see HPI for pertinent positives and negatives  Physical Exam BP (!) 132/117 (BP Location: Right Arm)   Pulse 97   Temp 98.3 F (36.8 C) (Oral)   Resp 19   Ht 5\' 8"  (1.727 m)   Wt 68 kg   SpO2 96%   BMI 22.81 kg/m   Physical Exam Vitals and nursing note reviewed.  Constitutional:      Appearance: Normal appearance.  HENT:     Head: Normocephalic and atraumatic.     Nose: Nose normal.     Mouth/Throat:     Mouth: Mucous membranes are moist.  Eyes:     Extraocular Movements: Extraocular movements intact.     Conjunctiva/sclera: Conjunctivae normal.  Cardiovascular:     Rate and Rhythm: Normal rate.  Pulmonary:     Effort: Pulmonary effort is normal.     Breath sounds: Normal breath sounds.  Abdominal:     General: Abdomen is flat.     Palpations: Abdomen is soft.     Tenderness: There is no abdominal tenderness.  Musculoskeletal:        General: No swelling. Normal range of motion.     Cervical back: Neck supple.  Skin:    General: Skin is warm and dry.  Neurological:     General: No focal deficit present.  Mental Status: He is alert.  Psychiatric:        Mood and Affect: Mood normal.    ED Results / Procedures / Treatments   EKG None  Procedures Procedures  Medications Ordered in the ED Medications - No data to display  Initial Impression and Plan  Patient is here seeking help with substance abuse. He is well appearing, no signs of acute withdrawal. He does not meet criteria for inpatient psychiatric admission. Given resource guide for area substance abuse facilities and encouraged to call around tomorrow morning to find out which may have beds available for him. Given sandwich/snack.   ED Course       MDM Rules/Calculators/A&P Medical Decision Making Problems Addressed: Substance abuse [facility]): chronic illness or injury with exacerbation,  progression, or side effects of treatment    Final Clinical Impression(s) / ED Diagnoses Final diagnoses:  Substance abuse [facility])    Rx / DC Orders ED Discharge Orders     None        Truddie Hidden, MD 03/25/22 [phone]

## 2022-03-29 ENCOUNTER — Emergency Department (HOSPITAL_COMMUNITY): Payer: BLUE CROSS/BLUE SHIELD

## 2022-03-29 ENCOUNTER — Encounter (HOSPITAL_COMMUNITY): Payer: Self-pay

## 2022-03-29 ENCOUNTER — Other Ambulatory Visit: Payer: Self-pay

## 2022-03-29 ENCOUNTER — Emergency Department (HOSPITAL_COMMUNITY)
Admission: EM | Admit: 2022-03-29 | Discharge: 2022-03-30 | Disposition: A | Discharge status: Home / Self Care | Payer: BLUE CROSS/BLUE SHIELD | Attending: Emergency Medicine | Admitting: Emergency Medicine

## 2022-03-29 DIAGNOSIS — R079 Chest pain, unspecified: Secondary | ICD-10-CM | POA: Diagnosis present

## 2022-03-29 DIAGNOSIS — F1914 Other psychoactive substance abuse with psychoactive substance-induced mood disorder: Secondary | ICD-10-CM | POA: Insufficient documentation

## 2022-03-29 DIAGNOSIS — Z20822 Contact with and (suspected) exposure to covid-19: Secondary | ICD-10-CM | POA: Diagnosis not present

## 2022-03-29 DIAGNOSIS — F1520 Other stimulant dependence, uncomplicated: Secondary | ICD-10-CM | POA: Diagnosis not present

## 2022-03-29 DIAGNOSIS — R45851 Suicidal ideations: Secondary | ICD-10-CM | POA: Diagnosis not present

## 2022-03-29 DIAGNOSIS — F1994 Other psychoactive substance use, unspecified with psychoactive substance-induced mood disorder: Secondary | ICD-10-CM | POA: Diagnosis not present

## 2022-03-29 DIAGNOSIS — Z79899 Other long term (current) drug therapy: Secondary | ICD-10-CM | POA: Insufficient documentation

## 2022-03-29 DIAGNOSIS — F1910 Other psychoactive substance abuse, uncomplicated: Secondary | ICD-10-CM | POA: Diagnosis not present

## 2022-03-29 LAB — COMPREHENSIVE METABOLIC PANEL
ALT: 48 U/L — ABNORMAL HIGH (ref 0–44)
AST: 61 U/L — ABNORMAL HIGH (ref 15–41)
Albumin: 4.2 g/dL (ref 3.5–5.0)
Alkaline Phosphatase: 64 U/L (ref 38–126)
Anion gap: 8 (ref 5–15)
BUN: 13 mg/dL (ref 6–20)
CO2: 24 mmol/L (ref 22–32)
Calcium: 9.2 mg/dL (ref 8.9–10.3)
Chloride: 110 mmol/L (ref 98–111)
Creatinine, Ser: 1.19 mg/dL (ref 0.61–1.24)
GFR, Estimated: >60 mL/min (ref >60)
Glucose, Bld: 108 mg/dL — ABNORMAL HIGH (ref 70–99)
Potassium: 3.5 mmol/L (ref 3.5–5.1)
Sodium: 142 mmol/L (ref 135–145)
Total Bilirubin: 0.9 mg/dL (ref 0.3–1.2)
Total Protein: 7.7 g/dL (ref 6.5–8.1)

## 2022-03-29 LAB — CBC
HCT: 38.1 % — ABNORMAL LOW (ref 39.0–52.0)
Hemoglobin: 12.2 g/dL — ABNORMAL LOW (ref 13.0–17.0)
MCH: 29 pg (ref 26.0–34.0)
MCHC: 32 g/dL (ref 30.0–36.0)
MCV: 90.5 fL (ref 80.0–100.0)
Platelets: 228 10*3/uL (ref 150–400)
RBC: 4.21 MIL/uL — ABNORMAL LOW (ref 4.22–5.81)
RDW: 15.3 % (ref 11.5–15.5)
WBC: 7.5 10*3/uL (ref 4.0–10.5)
nRBC: 0 % (ref 0.0–0.2)

## 2022-03-29 LAB — RAPID URINE DRUG SCREEN, HOSP PERFORMED
Amphetamines: POSITIVE — AB
Barbiturates: NOT DETECTED
Benzodiazepines: NOT DETECTED
Cocaine: NOT DETECTED
Opiates: NOT DETECTED
Tetrahydrocannabinol: POSITIVE — AB

## 2022-03-29 LAB — ACETAMINOPHEN LEVEL: Acetaminophen (Tylenol), Serum: <10 ug/mL — ABNORMAL LOW (ref 10–30)

## 2022-03-29 LAB — SARS CORONAVIRUS 2 BY RT PCR: SARS Coronavirus 2 by RT PCR: NEGATIVE

## 2022-03-29 LAB — SALICYLATE LEVEL: Salicylate Lvl: <7 mg/dL — ABNORMAL LOW (ref 7.0–30.0)

## 2022-03-29 LAB — TROPONIN I (HIGH SENSITIVITY): Troponin I (High Sensitivity): 3 ng/L (ref <18)

## 2022-03-29 LAB — ETHANOL: Alcohol, Ethyl (B): <10 mg/dL (ref <10)

## 2022-03-29 MED ORDER — LORAZEPAM 1 MG PO TABS: 0.0000 mg | ORAL_TABLET | Freq: Two times a day (BID) | ORAL | Status: DC

## 2022-03-29 MED ORDER — LORAZEPAM 2 MG/ML IJ SOLN
0.0000 mg | Freq: Four times a day (QID) | INTRAMUSCULAR | Status: DC
  Filled 2022-03-29: qty 2

## 2022-03-29 MED ORDER — LORAZEPAM 2 MG/ML IJ SOLN: 0.0000 mg | Freq: Two times a day (BID) | INTRAMUSCULAR | Status: DC

## 2022-03-29 MED ORDER — LORAZEPAM 1 MG PO TABS
1.0000 mg | ORAL_TABLET | Freq: Once | ORAL | Status: AC
  Administered 2022-03-29: 1 mg via ORAL
  Filled 2022-03-29: qty 1

## 2022-03-29 MED ORDER — THIAMINE HCL 100 MG/ML IJ SOLN: 100.0000 mg | Freq: Every day | INTRAMUSCULAR | Status: DC

## 2022-03-29 MED ORDER — LORAZEPAM 1 MG PO TABS: 0.0000 mg | ORAL_TABLET | Freq: Four times a day (QID) | ORAL | Status: DC

## 2022-03-29 MED ORDER — THIAMINE HCL 100 MG PO TABS: 100.0000 mg | ORAL_TABLET | Freq: Every day | ORAL | Status: DC

## 2022-03-29 NOTE — ED Triage Notes (Signed)
Patient said that he used meth and now his chest hurts. He is feeling suicidal because his girlfriend cheated on him. He said he would use a gun to shoot himself.

## 2022-03-29 NOTE — ED Provider Notes (Signed)
[facility] DEPT Provider Note   CSN: [id number] Arrival date & time: 03/29/22  2019     History  Chief Complaint  Patient presents with   Chest Pain   Suicidal    Brian [name] is a 36 y.o. male.  Patient with history of polysubstance abuse, NSTEMI, pericarditis presents today with chest pain and SI.  He states over the past few months he has had increasing substance abuse after his girlfriends ex-husband got out of jail and she subsequently left the patient for her ex-husband.  He states that she officially left him today and he subsequently smoked a significant amount of meth and injected fentanyl and a drank a pint of liquor.  He states that after this he developed chest pain.  He states that the pain was substernal in nature and did not radiate.  He denies any associated shortness of breath.  Upon my examination sometime after the patient arrived, he states that he is no longer having chest pain.  He states he is only here now for mental health evaluation and suicidal ideation.  He states that for the past several days he has had thoughts of shooting himself in the head.  He does endorse access to firearms.  He denies any homicidal ideation or auditory/visual hallucinations.  The history is provided by the patient. No language interpreter was used.  Chest Pain     Home Medications Prior to Admission medications   Medication Sig Start Date End Date Taking? Authorizing Provider  Buprenorphine HCl-Naloxone HCl 2-0.5 MG FILM Place 1 strip under the tongue 3 (three) times daily. 12/09/21  Yes [provider]  buPROPion (WELLBUTRIN XL) 300 MG 24 hr tablet Take 300 mg by mouth daily.   Yes [provider]  gabapentin (NEURONTIN) 300 MG capsule Take 300 mg by mouth 3 (three) times daily.   Yes [provider]  LIPITOR 80 MG tablet Take 80 mg by mouth at bedtime. 03/25/22  Yes [provider]  QUEtiapine (SEROQUEL) 200 MG  tablet Take 1 tablet (200 mg total) by mouth 2 (two) times daily. Patient taking differently: Take 200 mg by mouth at bedtime. 04/18/21  Yes Pashayan, Redgie Grayer, MD  dicyclomine (BENTYL) 20 MG tablet Take 1 tablet (20 mg total) by mouth 2 (two) times daily as needed for spasms (abdominal cramping). Patient not taking: Reported on 03/29/2022 12/10/21   Kommor, Debe Coder, MD  isosorbide mononitrate (IMDUR) 30 MG 24 hr tablet Take 15 mg by mouth daily. 03/25/22   [provider]  polyethylene glycol (MIRALAX) 17 g packet Take 17 g by mouth 2 (two) times daily. Patient not taking: Reported on 03/29/2022 12/10/21   Kommor, Debe Coder, MD  FLUoxetine (PROZAC) 20 MG capsule Take 1 capsule (20 mg total) by mouth daily. For depression Patient not taking: Reported on 11/04/2019 04/01/18 05/04/20  Lindell Spar I, NP  sucralfate (CARAFATE) 1 g tablet Take 1 tablet (1 g total) by mouth 4 (four) times daily -  with meals and at bedtime. Patient not taking: Reported on 11/04/2019 08/30/19 05/04/20  Joy, Helane Gunther, PA-C  traZODone (DESYREL) 50 MG tablet Take 1 tablet (50 mg total) by mouth at bedtime as needed for sleep. Patient not taking: Reported on 08/30/2019 03/31/18 08/30/19  Lindell Spar I, NP      Allergies    Fish allergy, Shellfish allergy, and Shellfish allergy    Review of Systems   Review of Systems  Cardiovascular:  Positive for chest pain.  Psychiatric/Behavioral:  Positive for suicidal ideas.   All other systems reviewed and are negative.  Physical Exam Updated Vital Signs BP (!) 162/90 (BP Location: Right Arm)   Pulse (!) 111   Temp 98 F (36.7 C) (Oral)   Resp (!) 22   Ht 5\' 8"  (1.727 m)   Wt 68 kg   SpO2 100%   BMI 22.81 kg/m  Physical Exam Vitals and nursing note reviewed.  Constitutional:      General: He is not in acute distress.    Appearance: Normal appearance. He is well-developed and normal weight. He is not ill-appearing, toxic-appearing or diaphoretic.  HENT:     Head:  Normocephalic and atraumatic.  Cardiovascular:     Rate and Rhythm: Normal rate and regular rhythm.     Pulses:          Radial pulses are 2+ on the right side and 2+ on the left side.     Heart sounds: Normal heart sounds. No murmur heard. Pulmonary:     Effort: Pulmonary effort is normal. No respiratory distress.     Breath sounds: Normal breath sounds.  Abdominal:     Palpations: Abdomen is soft.  Musculoskeletal:        General: Normal range of motion.     Cervical back: Normal range of motion.     Right lower leg: No tenderness. No edema.     Left lower leg: No tenderness. No edema.  Skin:    General: Skin is warm and dry.  Neurological:     General: No focal deficit present.     Mental Status: He is alert.  Psychiatric:        Mood and Affect: Mood normal.        Behavior: Behavior normal.    ED Results / Procedures / Treatments   Labs (all labs ordered are listed, but only abnormal results are displayed) Labs Reviewed  COMPREHENSIVE METABOLIC PANEL - Abnormal; Notable for the following components:      Result Value   Glucose, Bld 108 (*)    AST 61 (*)    ALT 48 (*)    All other components within normal limits  SALICYLATE LEVEL - Abnormal; Notable for the following components:   Salicylate Lvl <9.5 (*)    All other components within normal limits  ACETAMINOPHEN LEVEL - Abnormal; Notable for the following components:   Acetaminophen (Tylenol), Serum <10 (*)    All other components within normal limits  CBC - Abnormal; Notable for the following components:   RBC 4.21 (*)    Hemoglobin 12.2 (*)    HCT 38.1 (*)    All other components within normal limits  RAPID URINE DRUG SCREEN, HOSP PERFORMED - Abnormal; Notable for the following components:   Amphetamines POSITIVE (*)    Tetrahydrocannabinol POSITIVE (*)    All other components within normal limits  SARS CORONAVIRUS 2 BY RT PCR  ETHANOL  TROPONIN I (HIGH SENSITIVITY)  TROPONIN I (HIGH SENSITIVITY)     EKG None  Radiology DG Chest 2 View  Result Date: 03/29/2022 CLINICAL DATA:  Chest pain. EXAM: CHEST - 2 VIEW COMPARISON:  12/10/2021 FINDINGS: Improvement in heterogeneous bilateral lung opacities from prior exam, essentially resolved, with minor residual scarring in the right mid lung. There is no new or acute airspace disease. The heart is normal in size. Normal mediastinal contours. No pulmonary edema, pleural effusion, or pneumothorax no acute osseous findings. IMPRESSION: No acute chest findings. Essentially resolved  bilateral lung opacities from prior exam with minor residual scarring in the right mid lung. Electronically Signed   By: Keith Rake M.D.   On: 03/29/2022 20:53    Procedures Procedures    Medications Ordered in ED Medications  LORazepam (ATIVAN) tablet 1 mg (has no administration in time range)    ED Course/ Medical Decision Making/ A&P                           Medical Decision Making Amount and/or Complexity of Data Reviewed Labs: ordered. Radiology: ordered.  Risk Prescription drug management.   This patient presents to the ED for concern of chest pain and suicidal ideation, this involves an extensive number of treatment options, and is a complaint that carries with it a high risk of complications and morbidity.    Co morbidities that complicate the patient evaluation  Hx polysubstance abuse and NSTEMI   Lab Tests:  I Ordered, and personally interpreted labs.  The pertinent results include:  AST 61, ALT 48. Hemoglobin 12.2 improved from previous. Troponin negative. UDS positive for amphetamines and THC.   Imaging Studies ordered:  I ordered imaging studies including CXR  I independently visualized and interpreted imaging which showed  No acute chest findings. Essentially resolved bilateral lung opacities from prior exam with minor residual scarring in the right mid lung. I agree with the radiologist interpretation   Cardiac  Monitoring: / EKG:  The patient was maintained on a cardiac monitor.  I personally viewed and interpreted the cardiac monitored which showed an underlying rhythm of: no STEMI   Problem List / ED Course / Critical interventions / Medication management  I ordered medication including Ativan  for alcohol withdrawal  Reevaluation of the patient after these medicines showed that the patient improved I have reviewed the patients home medicines and have made adjustments as needed   Social Determinants of Health:  Hx polysubstance abuse   Test / Admission - Considered:  After evaluating all of the data points in this case, the presentation of Brian [name] is NOT consistent with Acute Coronary Syndrome (ACS) and/or myocardial ischemia, pulmonary embolism, aortic dissection; significant arrythmia, pneumothorax, cardiac tamponade, or other emergent cardiopulmonary condition.  Further, the presentation of Brian [name] is NOT consistent with pericarditis, myocarditis, cholecystitis, pancreatitis, mediastinitis, endocarditis, new valvular disease.  Additionally, the presentation of Brian A Craddockis NOT consistent with flail chest, cardiac contusion, ARDS, or significant intra-thoracic bleeding.  Moreover, this presentation is NOT consistent with pneumonia or sepsis.  The patient has a   low risk heart score.  Upon reevaluation, patient states that he is completely asymptomatic and that his pain was extremely short-lived after smoking a significant amount of methamphetamines.  I suspect that this is the etiology of his symptoms.  He is somewhat tachycardic, however he states that he feels that he is in alcohol withdrawal.  Given Ativan with significant improvement.  Given benign physical exam and laboratory evaluation, no further emergent concerns at this time.  He is medically clear for TTS consult.  Patient is currently willing to stay voluntarily.   Final Clinical Impression(s) / ED  Diagnoses Final diagnoses:  Suicidal ideation  Chest pain, unspecified type    Rx / DC Orders ED Discharge Orders     None         Nestor Lewandowsky 03/29/22 2339    Lacretia Leigh, MD 03/31/22 1504

## 2022-03-29 NOTE — ED Notes (Signed)
Patient changed out into burgandy scrubs. 2 patient belonging bags located at triage nurse station.

## 2022-03-29 NOTE — ED Notes (Signed)
Patient wanded by security. 

## 2022-03-29 NOTE — ED Notes (Signed)
Pt attempting to provide urine specimen

## 2022-03-30 ENCOUNTER — Other Ambulatory Visit: Payer: Self-pay

## 2022-03-30 ENCOUNTER — Other Ambulatory Visit (HOSPITAL_COMMUNITY)
Admission: EM | Admit: 2022-03-30 | Discharge: 2022-04-01 | Payer: No Payment, Other | Attending: Psychiatry | Admitting: Psychiatry

## 2022-03-30 DIAGNOSIS — Z765 Malingerer [conscious simulation]: Secondary | ICD-10-CM | POA: Insufficient documentation

## 2022-03-30 DIAGNOSIS — F1910 Other psychoactive substance abuse, uncomplicated: Secondary | ICD-10-CM | POA: Diagnosis not present

## 2022-03-30 DIAGNOSIS — Z20822 Contact with and (suspected) exposure to covid-19: Secondary | ICD-10-CM | POA: Diagnosis not present

## 2022-03-30 DIAGNOSIS — F1994 Other psychoactive substance use, unspecified with psychoactive substance-induced mood disorder: Secondary | ICD-10-CM

## 2022-03-30 DIAGNOSIS — F1510 Other stimulant abuse, uncomplicated: Secondary | ICD-10-CM

## 2022-03-30 DIAGNOSIS — F319 Bipolar disorder, unspecified: Secondary | ICD-10-CM | POA: Insufficient documentation

## 2022-03-30 DIAGNOSIS — Z5900 Homelessness unspecified: Secondary | ICD-10-CM | POA: Insufficient documentation

## 2022-03-30 DIAGNOSIS — R45851 Suicidal ideations: Secondary | ICD-10-CM | POA: Diagnosis not present

## 2022-03-30 DIAGNOSIS — F3181 Bipolar II disorder: Secondary | ICD-10-CM

## 2022-03-30 LAB — POCT URINE DRUG SCREEN - MANUAL ENTRY (I-SCREEN)
POC Amphetamine UR: POSITIVE — AB
POC Buprenorphine (BUP): NOT DETECTED
POC Cocaine UR: NOT DETECTED
POC Marijuana UR: POSITIVE — AB
POC Methadone UR: NOT DETECTED
POC Methamphetamine UR: POSITIVE — AB
POC Morphine: NOT DETECTED
POC Oxazepam (BZO): NOT DETECTED
POC Oxycodone UR: NOT DETECTED
POC Secobarbital (BAR): NOT DETECTED

## 2022-03-30 LAB — RESP PANEL BY RT-PCR (FLU A&B, COVID) ARPGX2
Influenza A by PCR: NEGATIVE
Influenza B by PCR: NEGATIVE
SARS Coronavirus 2 by RT PCR: NEGATIVE

## 2022-03-30 LAB — POC SARS CORONAVIRUS 2 AG: SARSCOV2ONAVIRUS 2 AG: NEGATIVE

## 2022-03-30 MED ORDER — STERILE WATER FOR INJECTION IJ SOLN
INTRAMUSCULAR | Status: AC
  Administered 2022-03-30: 1.2 mL
  Filled 2022-03-30: qty 10

## 2022-03-30 MED ORDER — GABAPENTIN 300 MG PO CAPS: 300.0000 mg | ORAL_CAPSULE | Freq: Three times a day (TID) | ORAL | Status: DC

## 2022-03-30 MED ORDER — BUPROPION HCL ER (XL) 300 MG PO TB24
300.0000 mg | ORAL_TABLET | Freq: Every day | ORAL | Status: DC
  Administered 2022-03-31: 300 mg via ORAL
  Filled 2022-03-30: qty 1

## 2022-03-30 MED ORDER — DIPHENHYDRAMINE HCL 25 MG PO CAPS
50.0000 mg | ORAL_CAPSULE | Freq: Once | ORAL | Status: AC
  Administered 2022-03-30: 50 mg via ORAL
  Filled 2022-03-30: qty 2

## 2022-03-30 MED ORDER — ACETAMINOPHEN 325 MG PO TABS: 650.0000 mg | ORAL_TABLET | Freq: Four times a day (QID) | ORAL | Status: DC | PRN

## 2022-03-30 MED ORDER — GABAPENTIN 300 MG PO CAPS
300.0000 mg | ORAL_CAPSULE | Freq: Three times a day (TID) | ORAL | Status: DC
  Administered 2022-03-30 – 2022-04-01 (×5): 300 mg via ORAL
  Filled 2022-03-30 (×5): qty 1
  Filled 2022-03-30: qty 42

## 2022-03-30 MED ORDER — ZIPRASIDONE MESYLATE 20 MG IM SOLR
20.0000 mg | Freq: Once | INTRAMUSCULAR | Status: AC
  Administered 2022-03-30: 20 mg via INTRAMUSCULAR
  Filled 2022-03-30: qty 20

## 2022-03-30 MED ORDER — MAGNESIUM HYDROXIDE 400 MG/5ML PO SUSP: 30.0000 mL | Freq: Every day | ORAL | Status: DC | PRN

## 2022-03-30 MED ORDER — TRAZODONE HCL 50 MG PO TABS
50.0000 mg | ORAL_TABLET | Freq: Every evening | ORAL | Status: DC | PRN
  Filled 2022-03-30: qty 14

## 2022-03-30 MED ORDER — ATORVASTATIN CALCIUM 40 MG PO TABS: 80.0000 mg | ORAL_TABLET | Freq: Every day | ORAL | Status: DC

## 2022-03-30 MED ORDER — ALUM & MAG HYDROXIDE-SIMETH 200-200-20 MG/5ML PO SUSP: 30.0000 mL | ORAL | Status: DC | PRN

## 2022-03-30 MED ORDER — ATORVASTATIN CALCIUM 40 MG PO TABS
80.0000 mg | ORAL_TABLET | Freq: Every day | ORAL | Status: DC
  Administered 2022-03-30 – 2022-03-31 (×2): 80 mg via ORAL
  Filled 2022-03-30: qty 28
  Filled 2022-03-30 (×2): qty 2

## 2022-03-30 MED ORDER — QUETIAPINE FUMARATE 200 MG PO TABS
200.0000 mg | ORAL_TABLET | Freq: Every day | ORAL | Status: DC
  Administered 2022-03-30 – 2022-03-31 (×2): 200 mg via ORAL
  Filled 2022-03-30: qty 1
  Filled 2022-03-30: qty 14
  Filled 2022-03-30: qty 1

## 2022-03-30 MED ORDER — QUETIAPINE FUMARATE 100 MG PO TABS: 200.0000 mg | ORAL_TABLET | Freq: Every day | ORAL | Status: DC

## 2022-03-30 MED ORDER — BUPROPION HCL ER (XL) 150 MG PO TB24: 300.0000 mg | ORAL_TABLET | Freq: Every day | ORAL | Status: DC

## 2022-03-30 MED ORDER — ISOSORBIDE MONONITRATE ER 30 MG PO TB24
15.0000 mg | ORAL_TABLET | Freq: Every day | ORAL | Status: DC
  Administered 2022-03-31 – 2022-04-01 (×2): 15 mg via ORAL
  Filled 2022-03-30 (×2): qty 1
  Filled 2022-03-30: qty 7

## 2022-03-30 MED ORDER — HYDROXYZINE HCL 25 MG PO TABS: 25.0000 mg | ORAL_TABLET | Freq: Three times a day (TID) | ORAL | Status: DC | PRN

## 2022-03-30 NOTE — ED Provider Notes (Signed)
Emergency Medicine Observation Re-evaluation Note  Brian [name] is a 36 y.o. male, seen on rounds today.  Pt initially presented to the ED for complaints of Chest Pain and Suicidal Currently, the patient is resting.  Physical Exam  BP 110/72   Pulse 60   Temp 98 F (36.7 C) (Oral)   Resp 16   Ht 5\' 8"  (1.727 m)   Wt 68 kg   SpO2 100%   BMI 22.81 kg/m  Physical Exam HENT:     Head: Normocephalic.  Eyes:     Pupils: Pupils are equal, round, and reactive to light.  Neurological:     Mental Status: He is alert.  Psychiatric:        Mood and Affect: Mood normal.     ED Course / MDM  EKG:EKG Interpretation  Date/Time:  Saturday March 29 2022 20:30:47 EDT Ventricular Rate:  103 PR Interval:  128 QRS Duration: 85 QT Interval:  333 QTC Calculation: 436 R Axis:   99 Text Interpretation: Sinus tachycardia Right atrial enlargement Borderline right axis deviation Baseline wander in lead(s) V3 Partial missing lead(s): V3 Confirmed by Quintella Reichert [phone]) on 03/30/2022 2:53:16 AM  I have reviewed the labs performed to date as well as medications administered while in observation.  Recent changes in the last 24 hours include seen by psych.  Plan  Current plan is for inpatient treatment.  Brian [name] is IVC     Brian Sites, DO 03/30/22 0745

## 2022-03-30 NOTE — ED Notes (Signed)
Pt refusing d/c vitals.

## 2022-03-30 NOTE — ED Provider Notes (Signed)
Cleared by psychiatry, substance abuse counseling resources provided.   Lennice Sites, DO 03/30/22 [phone]

## 2022-03-30 NOTE — BH Assessment (Signed)
Brian [name], [facility] at [facility], says "With his verbal abusiveness and hostility he is not appropriate for the [facility] unit."  Other facilities will be contacted for placement.   Evelena Peat, [facility], [facility] Triage Specialist [phone]
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
## 2022-03-30 NOTE — ED Notes (Signed)
Following geodon administration, pt began to get verbally abusive and cussing to staff, stating doctor is a "bitch" and "You all don't know how to do anything" Pt then walked out of room and walked down to triage and began picking up other pt's belongings. Pt continued to yell and pace around hall. Pt was escorted back to room by security. Pt now laying in bed, resting. Pt presents to be calmed down from [place].

## 2022-03-30 NOTE — Progress Notes (Signed)
   03/30/22 2130  [place] Triage Screening (Walk-ins at [facility] only)  How Did You Hear About [place]? Self  What Is the Reason for Your Visit/Call Today? Pt has a history of mental health and substance abuse problems and was psychiatrically cleared earlier today at [facility]. He says he has been using alcohol, methamphetamines, and fentanyl daily. He reports drinking 2-3 "hard lemonaids" today and denies using drugs. He says he is depressed and currently experiencing suicidal ideation with plan to shoot himself or overdose on fentanyl. He says he does not have access to firearms. He denies homicidal ideation. He denies psychotic symptoms. He is currently homeless. He is requesting inpatient crisis stabilization followed by residential subsatnce abuse treatment.  How Long Has This Been Causing You Problems? > than 6 months  Have You Recently Had Any Thoughts About Hurting Yourself? Yes  How long ago did you have thoughts about hurting yourself? Today  Are You Planning to Commit Suicide/Harm Yourself At This time? No  Have you Recently Had Thoughts About [place]? No  Are You Planning To Harm Someone At This Time? No  Are you currently experiencing any auditory, visual or other hallucinations? No  Have You Used Any Alcohol or Drugs in the Past 24 Hours? Yes  How long ago did you use Drugs or Alcohol? Today  What Did You Use and How Much? Pt reports drinking 2-3 "hard lemonaids" today  Do you have any current medical co-morbidities that require immediate attention? No  Clinician description of patient physical appearance/behavior: Pt is casually dressed, alert and oriented x4. Pt speaks in a clear tone, at moderate volume and normal pace. Motor behavior appears normal. Eye contact is good. Pt's mood is depressed and affect is congruent with mood. Thought process is coherent and relevant. There is no indication Pt is currently responding to internal stimuli or experiencing delusional thought content. He is  cooperative.  What Do You Feel Would Help You the Most Today? Alcohol or Drug Use Treatment;Treatment for Depression or other mood problem;Medication(s)  If access to [facility] Urgent Care was not available, would you have sought care in the Emergency Department? Yes  Determination of Need Urgent (48 hours)  Options For Referral Facility-Based Crisis;Outpatient Therapy;BH Urgent Care;Medication Management

## 2022-03-30 NOTE — ED Provider Notes (Signed)
Patient agitated, in and out of the room.  He is here for suicidal ideation and seeking detox.  He has a plan to shoot himself with a gun.  IVC completed for patient's safety.  He was treated with Geodon for agitation.  After treatment patient resting comfortably in the room.   Quintella Reichert, MD 03/30/22 [phone]

## 2022-03-30 NOTE — ED Notes (Signed)
Pt upset when giving d/c paperwork. Pt threw paperwork in the trash and stated "y'all don't want to help anyone get clean." Pt reassured that resources are in the paperwork and that this RN would help pt follow up w/ desired outpatient treatment areas. Pt continuing to yell about how we do not want to help him. Pt dressed and exited the ED.

## 2022-03-30 NOTE — ED Notes (Signed)
Pt advised this writer that he would be really mad if I was bringing him benadryl right now instead of Suboxin and Gababpentin. Pt states that " I am the only one up in this place getting those meds I guarantee it." Pt states he doesn't want the Benadryl until he can get all of his meds.

## 2022-03-30 NOTE — ED Provider Notes (Signed)
[facility] Continuous Assessment Admission H&P  Date: 03/30/22 Patient Name: BROWNING SOUTHWOOD MRN: [id number] Chief Complaint:  Chief Complaint  Patient presents with   Suicidal   Addiction Problem      Diagnoses:  Final diagnoses:  Substance induced mood disorder ([place])  Suicide ideation    HPI: LAMARCO GUDIEL is a 36 year old male with a psychiatric history of bipolar disorder, polysubstance abuse (meth, cannabis, alcohol, opioids), depression, and suicidal ideation.  Patient presented voluntarily to [facility] complaining of worsening depression, suicidal ideation, and requesting substance abuse treatment.  Off note, patient presented to [facility] on 06/03 with similar complaint he was evaluated and observed overnight.  Patient was psychiatrically cleared and discharged earlier today 03/30/22.  This nurse practitioner and reviewed patient's chart and met with him face-to-face.  On assessment, patient is alert and oriented x4.  Patient is calm and cooperative.  Patient's speech is clear and coherent, patient makes good eye contact.  Patient's mood is anxious with congruent affect and his his thought process is coherent.  Patient reported that he is depressed and having suicidal ideation with plan to overdose on fentanyl, shoot himself, or jump off a bridge.  Patient identifies recent break-up from his girlfriend as his main stressor.  He says he was untruthful about his depressive symptoms and suicidal ideation while being evaluated in the emergency room today.  He is endorsing depressive symptoms of crying, hopelessness, worthlessness, poor sleep, poor concentration, and increased anxiety.  Patient reported that he is unable to contract for safety and continues to endorse suicidal ideation with plan to harm himself if discharged.  Patient denies homicidal ideation, paranoia, and hallucinations.  Patient endorses using methamphetamine and fentanyl daily however he denies using these  substances today.  Patient reports that he drank "2 Mike's hard lemonade" today 03-30-2022.  He denies all other substance use.  Patient says he would like to detox and then enroll in substance abuse treatment program. Patient says he plans to follow up with [facility] for residential substance abuse treatment.    PHQ 2-9:  Flowsheet Row ED from 05/02/2020 in [place] DEPT  Thoughts that you would be better off dead, or of hurting yourself in some way More than half the days  PHQ-9 Total Score 12       [place] ED from 03/29/2022 in [place] DEPT ED from 03/25/2022 in [place] DEPT ED from 12/09/2021 in [place] DEPT  C-SSRS RISK CATEGORY High Risk No Risk Error: Q3, 4, or 5 should not be populated when Q2 is No        Total Time spent with patient: 15 minutes  Musculoskeletal  Strength & Muscle Tone: within normal limits Gait & Station: normal Patient leans: Right  Psychiatric Specialty Exam  Presentation General Appearance: Appropriate for Environment  Eye Contact:Good  Speech:Clear and Coherent  Speech Volume:Normal  Handedness:Right   Mood and Affect  Mood:Depressed  Affect:Congruent   Thought Process  Thought Processes:Coherent  Descriptions of Associations:Intact  Orientation:Full (Time, Place and Person)  Thought Content:WDL  Diagnosis of Schizophrenia or Schizoaffective disorder in past: No   Hallucinations:Hallucinations: None  Ideas of Reference:None  Suicidal Thoughts:Suicidal Thoughts: Yes, Active SI Active Intent and/or Plan: With Plan  Homicidal Thoughts:Homicidal Thoughts: No   Sensorium  Memory:Immediate Good; Recent Good; Remote Good  Judgment:Fair  Insight:Good   Executive Functions  Concentration:Good  Attention Span:Fair  [place]  Psychomotor Activity   Psychomotor Activity:Psychomotor Activity: Normal   Assets  Assets:Communication Skills; Desire for Improvement; Physical Health   Sleep  Sleep:Sleep: Fair Number of Hours of Sleep: 6   Nutritional Assessment (For OBS and FBC admissions only) Has the patient had a weight loss or gain of 10 pounds or more in the last 3 months?: No Has the patient had a decrease in food intake/or appetite?: No Does the patient have dental problems?: No Does the patient have eating habits or behaviors that may be indicators of an eating disorder including binging or inducing vomiting?: No Has the patient recently lost weight without trying?: 0 Has the patient been eating poorly because of a decreased appetite?: 0 Malnutrition Screening Tool Score: 0    Physical Exam Vitals and nursing note reviewed.  Constitutional:      General: He is not in acute distress.    Appearance: He is well-developed. He is not ill-appearing.  HENT:     Head: Normocephalic and atraumatic.  Eyes:     Conjunctiva/sclera: Conjunctivae normal.  Cardiovascular:     Rate and Rhythm: Normal rate.  Pulmonary:     Effort: Pulmonary effort is normal. No respiratory distress.     Breath sounds: Normal breath sounds.  Abdominal:     Palpations: Abdomen is soft.     Tenderness: There is no abdominal tenderness.  Musculoskeletal:        General: No swelling.     Cervical back: Neck supple.  Skin:    General: Skin is warm and dry.     Capillary Refill: Capillary refill takes less than 2 seconds.  Neurological:     Mental Status: He is alert and oriented to person, place, and time.  Psychiatric:        Attention and Perception: Attention and perception normal.        Mood and Affect: Mood is anxious and depressed.        Speech: Speech normal.        Behavior: Behavior normal. Behavior is cooperative.        Thought Content: Thought content includes suicidal ideation. Thought content includes suicidal plan.         Cognition and Memory: Cognition normal.   Review of Systems  Constitutional: Negative.   HENT: Negative.    Eyes: Negative.   Respiratory: Negative.    Cardiovascular: Negative.   Gastrointestinal: Negative.   Genitourinary: Negative.   Skin: Negative.   Neurological: Negative.   Endo/Heme/Allergies: Negative.   Psychiatric/Behavioral:  Positive for depression, substance abuse and suicidal ideas. The patient is nervous/anxious.    Blood pressure 133/87, pulse 99, temperature 98.9 F (37.2 C), temperature source Oral, resp. rate 18, SpO2 100 %. There is no height or weight on file to calculate BMI.  Past Psychiatric History:   bipolar disorder, polysubstance abuse (meth, cannabis, alcohol, opioids), depression, and suicidal ideation.   Is the patient at risk to self? Yes  Has the patient been a risk to self in the past 6 months? Yes .    Has the patient been a risk to self within the distant past? Yes   Is the patient a risk to others? No   Has the patient been a risk to others in the past 6 months? No   Has the patient been a risk to others within the distant past? 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 Collene Mares about sedation type/timinng - I recommend MAC   HAND SURGERY  2006   LEFT HEART CATH AND CORONARY ANGIOGRAPHY N/A 06/21/2020   Procedure: LEFT HEART CATH AND CORONARY ANGIOGRAPHY;  Surgeon: Nelva Bush,  MD;  Location: [facility];  Service: Cardiovascular;  Laterality: N/A;    Family History:  Family History  Problem Relation Age of Onset   Heart disease Father     Social History:  Social History   Socioeconomic History   Marital status: Single    Spouse name: Not on file   Number of children: Not on file   Years of education: Not on file   Highest education level: Not on file  Occupational History   Not on file  Tobacco Use   Smoking status: Every Day    Packs/day: 0.50    Types: Cigarettes   Smokeless tobacco: Never  Vaping Use   Vaping Use: Never used  Substance and Sexual Activity   Alcohol use: Not Currently   Drug use: Yes    Types: Marijuana, Cocaine, IV, Methamphetamines    Comment: "meth yesterday morning" per pt   Sexual activity: Not Currently  Other Topics Concern   Not on file  Social History Narrative   ** Merged History Encounter **       Lives with a friend   Lost house and custody of son 9broke up with fiancee)   Nature conservation officer   Social Determinants of Radio broadcast assistant Strain: Not on file  Food Insecurity: Not on file  Transportation Needs: Not on file  Physical Activity: Not on file  Stress: Not on file  Social Connections: Not on file  Intimate Partner Violence: Not on file    SDOH:  SDOH Screenings   Alcohol Screen: Not on file  Depression (PHQ2-9): Not on file  Financial Resource Strain: Not on file  Food Insecurity: Not on file  Housing: Not on file  Physical Activity: Not on file  Social Connections: Not on file  Stress: Not on file  Tobacco Use: High Risk   Smoking Tobacco Use: Every Day   Smokeless Tobacco Use: Never   Passive Exposure: Not on file  Transportation Needs: Not on file    Last Labs:  Admission on 03/30/2022  Component Date Value Ref Range Status   POC Amphetamine UR 03/30/2022 Positive (A)  NONE DETECTED (Cut Off Level 1000 ng/mL) Final   POC Secobarbital (BAR) 03/30/2022 None  Detected  NONE DETECTED (Cut Off Level 300 ng/mL) Final   POC Buprenorphine (BUP) 03/30/2022 None Detected  NONE DETECTED (Cut Off Level 10 ng/mL) Final   POC Oxazepam (BZO) 03/30/2022 None Detected  NONE DETECTED (Cut Off Level 300 ng/mL) Final   POC Cocaine UR 03/30/2022 None Detected  NONE DETECTED (Cut Off Level 300 ng/mL) Final   POC Methamphetamine UR 03/30/2022 Positive (A)  NONE DETECTED (Cut Off Level 1000 ng/mL)  Final   POC Morphine 03/30/2022 None Detected  NONE DETECTED (Cut Off Level 300 ng/mL) Final   POC Methadone UR 03/30/2022 None Detected  NONE DETECTED (Cut Off Level 300 ng/mL) Final   POC Oxycodone UR 03/30/2022 None Detected  NONE DETECTED (Cut Off Level 100 ng/mL) Final   POC Marijuana UR 03/30/2022 Positive (A)  NONE DETECTED (Cut Off Level 50 ng/mL) Final   SARSCOV2ONAVIRUS 2 AG 03/30/2022 NEGATIVE  NEGATIVE Final   Comment: (NOTE) SARS-CoV-2 antigen NOT DETECTED.   Negative results are presumptive.  Negative results do not preclude SARS-CoV-2 infection and should not be used as the sole basis for treatment or other patient management decisions, including infection  control decisions, particularly in the presence of clinical signs and  symptoms consistent with COVID-19, or in those who have been in contact with the virus.  Negative results must be combined with clinical observations, patient history, and epidemiological information. The expected result is Negative.  Fact Sheet for Patients: HandmadeRecipes.com.cy  Fact Sheet for Healthcare Providers: FuneralLife.at  This test is not yet approved or cleared by the [place] FDA and  has been authorized for detection and/or diagnosis of SARS-CoV-2 by FDA under an Emergency Use Authorization (EUA).  This EUA will remain in effect (meaning this test can be used) for the duration of  the COV                          ID-19 declaration under Section 564(b)(1) of the  Act, 21 U.S.C. section 360bbb-3(b)(1), unless the authorization is terminated or revoked sooner.    Admission on 03/29/2022, Discharged on 03/30/2022  Component Date Value Ref Range Status   Sodium 03/29/2022 142  135 - 145 mmol/L Final   Potassium 03/29/2022 3.5  3.5 - 5.1 mmol/L Final   Chloride 03/29/2022 110  98 - 111 mmol/L Final   CO2 03/29/2022 24  22 - 32 mmol/L Final   Glucose, Bld 03/29/2022 108 (H)  70 - 99 mg/dL Final   Glucose reference range applies only to samples taken after fasting for at least 8 hours.   BUN 03/29/2022 13  6 - 20 mg/dL Final   Creatinine, Ser 03/29/2022 1.19  0.61 - 1.24 mg/dL Final   Calcium 03/29/2022 9.2  8.9 - 10.3 mg/dL Final   Total Protein 03/29/2022 7.7  6.5 - 8.1 g/dL Final   Albumin 03/29/2022 4.2  3.5 - 5.0 g/dL Final   AST 03/29/2022 61 (H)  15 - 41 U/L Final   ALT 03/29/2022 48 (H)  0 - 44 U/L Final   Alkaline Phosphatase 03/29/2022 64  38 - 126 U/L Final   Total Bilirubin 03/29/2022 0.9  0.3 - 1.2 mg/dL Final   GFR, Estimated 03/29/2022 >60  >60 mL/min Final   Comment: (NOTE) Calculated using the CKD-EPI Creatinine Equation (2021)    Anion gap 03/29/2022 8  5 - 15 Final   Performed at [facility], [place] [address], [place], [place] [address]   Alcohol, Ethyl (B) 03/29/2022 <10  <10 mg/dL Final   Comment: (NOTE) Lowest detectable limit for serum alcohol is 10 mg/dL.  For medical purposes only. Performed at [facility], [place] [address] [place], [place] [address]    Salicylate Lvl 21/30/8657 <7.0 (L)  7.0 - 30.0 mg/dL Final   Performed at [place] [address], [place], [place] [address]   Acetaminophen (Tylenol), Serum 03/29/2022 <10 (L)  10 -  30 ug/mL Final   Comment: (NOTE) Therapeutic concentrations vary significantly. A range of 10-30 ug/mL  may be an effective concentration for many patients. However, some  are best treated at concentrations outside of this  range. Acetaminophen concentrations >150 ug/mL at 4 hours after ingestion  and >50 ug/mL at 12 hours after ingestion are often associated with  toxic reactions.  Performed at [facility], [place] [address] [place] [address], [place], [place] [address]    WBC 03/29/2022 7.5  4.0 - 10.5 K/uL Final   RBC 03/29/2022 4.21 (L)  4.22 - 5.81 MIL/uL Final   Hemoglobin 03/29/2022 12.2 (L)  13.0 - 17.0 g/dL Final   HCT 03/29/2022 38.1 (L)  39.0 - 52.0 % Final   MCV 03/29/2022 90.5  80.0 - 100.0 fL Final   MCH 03/29/2022 29.0  26.0 - 34.0 pg Final   MCHC 03/29/2022 32.0  30.0 - 36.0 g/dL Final   RDW 03/29/2022 15.3  11.5 - 15.5 % Final   Platelets 03/29/2022 228  150 - 400 K/uL Final   nRBC 03/29/2022 0.0  0.0 - 0.2 % Final   Performed at [facility] [place] [facility], [place] [address], [place], [place] [address]   Opiates 03/29/2022 NONE DETECTED  NONE DETECTED Final   Cocaine 03/29/2022 NONE DETECTED  NONE DETECTED Final   Benzodiazepines 03/29/2022 NONE DETECTED  NONE DETECTED Final   Amphetamines 03/29/2022 POSITIVE (A)  NONE DETECTED Final   Tetrahydrocannabinol 03/29/2022 POSITIVE (A)  NONE DETECTED Final   Barbiturates 03/29/2022 NONE DETECTED  NONE DETECTED Final   Comment: (NOTE) DRUG SCREEN FOR MEDICAL PURPOSES ONLY.  IF CONFIRMATION IS NEEDED FOR ANY PURPOSE, NOTIFY LAB WITHIN 5 DAYS.  LOWEST DETECTABLE LIMITS FOR URINE DRUG SCREEN Drug Class                     Cutoff (ng/mL) Amphetamine and metabolites    1000 Barbiturate and metabolites    200 Benzodiazepine                 417 Tricyclics and metabolites     300 Opiates and metabolites        300 Cocaine and metabolites        300 THC                            50 Performed at [facility], [place] [address], [place], [place] [address]    Troponin I (High Sensitivity) 03/29/2022 3  <18 ng/L Final   Comment: (NOTE) Elevated high sensitivity troponin I (hsTnI) values and significant  changes across serial  measurements may suggest ACS but many other  chronic and acute conditions are known to elevate hsTnI results.  Refer to the "Links" section for chest pain algorithms and additional  guidance. Performed at [facility], [place] [address] [place], [place] [address]    SARS Coronavirus 2 by RT PCR 03/29/2022 NEGATIVE  NEGATIVE Final   Comment: (NOTE) SARS-CoV-2 target nucleic acids are NOT DETECTED.  The SARS-CoV-2 RNA is generally detectable in upper and lower respiratory specimens during the acute phase of infection. The lowest concentration of SARS-CoV-2 viral copies this assay can detect is 250 copies / mL. A negative result does not preclude SARS-CoV-2 infection and should not be used as the sole basis for treatment or other patient management decisions.  A negative result may occur with improper specimen collection / handling, submission of specimen  other than nasopharyngeal swab, presence of viral mutation(s) within the areas targeted by this assay, and inadequate number of viral copies (<250 copies / mL). A negative result must be combined with clinical observations, patient history, and epidemiological information.  Fact Sheet for Patients:   https://www.patel.info/  Fact Sheet for Healthcare Providers: https://hall.com/  This test is not yet approved or                           cleared by the [place] FDA and has been authorized for detection and/or diagnosis of SARS-CoV-2 by FDA under an Emergency Use Authorization (EUA).  This EUA will remain in effect (meaning this test can be used) for the duration of the COVID-19 declaration under Section 564(b)(1) of the Act, 21 U.S.C. section 360bbb-3(b)(1), unless the authorization is terminated or revoked sooner.  Performed at [facility], [place] [address], [place], [place] [address]    SARS Coronavirus 2 by RT PCR 03/30/2022 NEGATIVE  NEGATIVE  Final   Comment: (NOTE) SARS-CoV-2 target nucleic acids are NOT DETECTED.  The SARS-CoV-2 RNA is generally detectable in upper respiratory specimens during the acute phase of infection. The lowest concentration of SARS-CoV-2 viral copies this assay can detect is 138 copies/mL. A negative result does not preclude SARS-Cov-2 infection and should not be used as the sole basis for treatment or other patient management decisions. A negative result may occur with  improper specimen collection/handling, submission of specimen other than nasopharyngeal swab, presence of viral mutation(s) within the areas targeted by this assay, and inadequate number of viral copies(<138 copies/mL). A negative result must be combined with clinical observations, patient history, and epidemiological information. The expected result is Negative.  Fact Sheet for Patients:  EntrepreneurPulse.com.au  Fact Sheet for Healthcare Providers:  IncredibleEmployment.be  This test is no                          t yet approved or cleared by the [place] FDA and  has been authorized for detection and/or diagnosis of SARS-CoV-2 by FDA under an Emergency Use Authorization (EUA). This EUA will remain  in effect (meaning this test can be used) for the duration of the COVID-19 declaration under Section 564(b)(1) of the Act, 21 U.S.C.section 360bbb-3(b)(1), unless the authorization is terminated  or revoked sooner.       Influenza A by PCR 03/30/2022 NEGATIVE  NEGATIVE Final   Influenza B by PCR 03/30/2022 NEGATIVE  NEGATIVE Final   Comment: (NOTE) The Xpert Xpress SARS-CoV-2/FLU/RSV plus assay is intended as an aid in the diagnosis of influenza from Nasopharyngeal swab specimens and should not be used as a sole basis for treatment. Nasal washings and aspirates are unacceptable for Xpert Xpress SARS-CoV-2/FLU/RSV testing.  Fact Sheet for  Patients: EntrepreneurPulse.com.au  Fact Sheet for Healthcare Providers: IncredibleEmployment.be  This test is not yet approved or cleared by the [place] FDA and has been authorized for detection and/or diagnosis of SARS-CoV-2 by FDA under an Emergency Use Authorization (EUA). This EUA will remain in effect (meaning this test can be used) for the duration of the COVID-19 declaration under Section 564(b)(1) of the Act, 21 U.S.C. section 360bbb-3(b)(1), unless the authorization is terminated or revoked.  Performed at [facility], [place] [address] [place], [place] [address]   Admission on 12/09/2021, Discharged on 12/10/2021  Component Date Value Ref Range Status   Color, Urine 12/09/2021 YELLOW  YELLOW Final   APPearance 12/09/2021 CLEAR  CLEAR Final   Specific Gravity, Urine 12/09/2021 1.010  1.005 - 1.030 Final   pH 12/09/2021 6.0  5.0 - 8.0 Final   Glucose, UA 12/09/2021 NEGATIVE  NEGATIVE mg/dL Final   Hgb urine dipstick 12/09/2021 NEGATIVE  NEGATIVE Final   Bilirubin Urine 12/09/2021 NEGATIVE  NEGATIVE Final   Ketones, ur 12/09/2021 NEGATIVE  NEGATIVE mg/dL Final   Protein, ur 12/09/2021 NEGATIVE  NEGATIVE mg/dL Final   Nitrite 12/09/2021 NEGATIVE  NEGATIVE Final   Leukocytes,Ua 12/09/2021 NEGATIVE  NEGATIVE Final   Performed at [place] [address], [place], [place] [address]   WBC 12/09/2021 13.2 (H)  4.0 - 10.5 K/uL Final   RBC 12/09/2021 3.96 (L)  4.22 - 5.81 MIL/uL Final   Hemoglobin 12/09/2021 11.5 (L)  13.0 - 17.0 g/dL Final   HCT 12/09/2021 35.4 (L)  39.0 - 52.0 % Final   MCV 12/09/2021 89.4  80.0 - 100.0 fL Final   MCH 12/09/2021 29.0  26.0 - 34.0 pg Final   MCHC 12/09/2021 32.5  30.0 - 36.0 g/dL Final   RDW 12/09/2021 13.2  11.5 - 15.5 % Final   Platelets 12/09/2021 294  150 - 400 K/uL Final   nRBC 12/09/2021 0.0  0.0 - 0.2 % Final   Neutrophils Relative % 12/09/2021 70  %  Final   Neutro Abs 12/09/2021 9.2 (H)  1.7 - 7.7 K/uL Final   Lymphocytes Relative 12/09/2021 14  % Final   Lymphs Abs 12/09/2021 1.8  0.7 - 4.0 K/uL Final   Monocytes Relative 12/09/2021 11  % Final   Monocytes Absolute 12/09/2021 1.5 (H)  0.1 - 1.0 K/uL Final   Eosinophils Relative 12/09/2021 5  % Final   Eosinophils Absolute 12/09/2021 0.7 (H)  0.0 - 0.5 K/uL Final   Basophils Relative 12/09/2021 0  % Final   Basophils Absolute 12/09/2021 0.1  0.0 - 0.1 K/uL Final   Immature Granulocytes 12/09/2021 0  % Final   Abs Immature Granulocytes 12/09/2021 0.04  0.00 - 0.07 K/uL Final   Performed at [facility], [place] [address], [place], [place] [address]   Sodium 12/09/2021 136  135 - 145 mmol/L Final   Potassium 12/09/2021 4.1  3.5 - 5.1 mmol/L Final   Chloride 12/09/2021 102  98 - 111 mmol/L Final   CO2 12/09/2021 27  22 - 32 mmol/L Final   Glucose, Bld 12/09/2021 112 (H)  70 - 99 mg/dL Final   Glucose reference range applies only to samples taken after fasting for at least 8 hours.   BUN 12/09/2021 16  6 - 20 mg/dL Final   Creatinine, Ser 12/09/2021 0.96  0.61 - 1.24 mg/dL Final   Calcium 12/09/2021 8.8 (L)  8.9 - 10.3 mg/dL Final   GFR, Estimated 12/09/2021 >60  >60 mL/min Final   Comment: (NOTE) Calculated using the CKD-EPI Creatinine Equation (2021)    Anion gap 12/09/2021 7  5 - 15 Final   Performed at [facility], [place] [address] [place], [place] [address]   Specimen Description 12/09/2021    Final                   Value:URINE, CLEAN CATCH Performed at [facility], [place] [address], [place], [place] [address]    Special Requests 12/09/2021    Final  Value:NONE Performed at [facility], [place] [address] [place], [place] [address]    Culture 12/09/2021    Final                   Value:NO GROWTH Performed at [facility], [place] [address], [place], [place] [address]    Report  Status 12/09/2021 12/11/2021 FINAL   Final   SARS Coronavirus 2 by RT PCR 12/10/2021 NEGATIVE  NEGATIVE Final   Comment: (NOTE) SARS-CoV-2 target nucleic acids are NOT DETECTED.  The SARS-CoV-2 RNA is generally detectable in upper respiratory specimens during the acute phase of infection. The lowest concentration of SARS-CoV-2 viral copies this assay can detect is 138 copies/mL. A negative result does not preclude SARS-Cov-2 infection and should not be used as the sole basis for treatment or other patient management decisions. A negative result may occur with  improper specimen collection/handling, submission of specimen other than nasopharyngeal swab, presence of viral mutation(s) within the areas targeted by this assay, and inadequate number of viral copies(<138 copies/mL). A negative result must be combined with clinical observations, patient history, and epidemiological information. The expected result is Negative.  Fact Sheet for Patients:  EntrepreneurPulse.com.au  Fact Sheet for Healthcare Providers:  IncredibleEmployment.be  This test is no                          t yet approved or cleared by the [place] FDA and  has been authorized for detection and/or diagnosis of SARS-CoV-2 by FDA under an Emergency Use Authorization (EUA). This EUA will remain  in effect (meaning this test can be used) for the duration of the COVID-19 declaration under Section 564(b)(1) of the Act, 21 U.S.C.section 360bbb-3(b)(1), unless the authorization is terminated  or revoked sooner.       Influenza A by PCR 12/10/2021 NEGATIVE  NEGATIVE Final   Influenza B by PCR 12/10/2021 NEGATIVE  NEGATIVE Final   Comment: (NOTE) The Xpert Xpress SARS-CoV-2/FLU/RSV plus assay is intended as an aid in the diagnosis of influenza from Nasopharyngeal swab specimens and should not be used as a sole basis for treatment. Nasal washings and aspirates are unacceptable for  Xpert Xpress SARS-CoV-2/FLU/RSV testing.  Fact Sheet for Patients: EntrepreneurPulse.com.au  Fact Sheet for Healthcare Providers: IncredibleEmployment.be  This test is not yet approved or cleared by the [place] FDA and has been authorized for detection and/or diagnosis of SARS-CoV-2 by FDA under an Emergency Use Authorization (EUA). This EUA will remain in effect (meaning this test can be used) for the duration of the COVID-19 declaration under Section 564(b)(1) of the Act, 21 U.S.C. section 360bbb-3(b)(1), unless the authorization is terminated or revoked.  Performed at [facility], [place] [address] [place], [place] [address]    Troponin I (High Sensitivity) 12/10/2021 12  <18 ng/L Final   Comment: (NOTE) Elevated high sensitivity troponin I (hsTnI) values and significant  changes across serial measurements may suggest ACS but many other  chronic and acute conditions are known to elevate hsTnI results.  Refer to the "Links" section for chest pain algorithms and additional  guidance. Performed at [facility], [place] [address] [place], [place] [address]    Specimen Description 12/10/2021    Final                   Value:BLOOD BLOOD RIGHT HAND Performed at [facility], [place] [address] [place], [place]  [address]    Special Requests 12/10/2021    Final                   Value:BOTTLES DRAWN AEROBIC AND ANAEROBIC Blood Culture adequate volume Performed at [place] [address], [place], [place] [address]    Culture 12/10/2021    Final                   Value:NO GROWTH 5 DAYS Performed at [place] [address], [place], [place] [address]    Report Status 12/10/2021 12/15/2021 FINAL   Final   Specimen Description 12/10/2021    Final                   Value:BLOOD BLOOD RIGHT ARM Performed at [facility], [place] [address],  [place], [place] [address]    Special Requests 12/10/2021    Final                   Value:BOTTLES DRAWN AEROBIC AND ANAEROBIC Blood Culture adequate volume Performed at [place] [address], [place], [place] [address]    Culture 12/10/2021    Final                   Value:NO GROWTH 5 DAYS Performed at [place] [address], [place], [place] [address]    Report Status 12/10/2021 12/15/2021 FINAL   Final   HIV-1 P24 Antigen - HIV24 12/10/2021 NON REACTIVE  NON REACTIVE Final   Comment: (NOTE) Detection of p24 may be inhibited by biotin in the sample, causing false negative results in acute infection.    HIV 1/2 Antibodies 12/10/2021 NON REACTIVE  NON REACTIVE Final   Interpretation (HIV Ag Ab) 12/10/2021 A non reactive test result means that HIV 1 or HIV 2 antibodies and HIV 1 p24 antigen were not detected in the specimen.   Final   Comment: RESULT CALLED TO, READ BACK BY AND VERIFIED WITH: BRATU,D. EMTP AT 1237 12/10/21 MULLINS,T Performed at [facility] [place] [address] [place], [place] [address]   Admission on 11/26/2021, Discharged on 11/26/2021  Component Date Value Ref Range Status   Sodium 11/26/2021 137  135 - 145 mmol/L Final   Potassium 11/26/2021 3.4 (L)  3.5 - 5.1 mmol/L Final   Chloride 11/26/2021 104  98 - 111 mmol/L Final   CO2 11/26/2021 25  22 - 32 mmol/L Final   Glucose, Bld 11/26/2021 114 (H)  70 - 99 mg/dL Final   Glucose reference range applies only to samples taken after fasting for at least 8 hours.   BUN 11/26/2021 14  6 - 20 mg/dL Final   Creatinine, Ser 11/26/2021 1.36 (H)  0.61 - 1.24 mg/dL Final   Calcium 11/26/2021 9.4  8.9 - 10.3 mg/dL Final   GFR, Estimated 11/26/2021 >60  >60 mL/min Final   Comment: (NOTE) Calculated using the CKD-EPI Creatinine Equation (2021)    Anion gap 11/26/2021 8  5 - 15 Final   Performed at [facility], [place] [address], [place], [place] [address]    WBC 11/26/2021 7.9  4.0 - 10.5 K/uL Final   RBC 11/26/2021 4.37  4.22 - 5.81 MIL/uL Final   Hemoglobin 11/26/2021 13.1  13.0 - 17.0 g/dL Final   HCT 11/26/2021 38.8 (L)  39.0 - 52.0 % Final   MCV 11/26/2021 88.8  80.0 - 100.0 fL Final  MCH 11/26/2021 30.0  26.0 - 34.0 pg Final   MCHC 11/26/2021 33.8  30.0 - 36.0 g/dL Final   RDW 11/26/2021 13.0  11.5 - 15.5 % Final   Platelets 11/26/2021 273  150 - 400 K/uL Final   nRBC 11/26/2021 0.0  0.0 - 0.2 % Final   Performed at [facility], [place] [address] [place], [place] [address]   Troponin I (High Sensitivity) 11/26/2021 2  <18 ng/L Final   Comment: (NOTE) Elevated high sensitivity troponin I (hsTnI) values and significant  changes across serial measurements may suggest ACS but many other  chronic and acute conditions are known to elevate hsTnI results.  Refer to the "Links" section for chest pain algorithms and additional  guidance. Performed at [facility], [place] [address] [place], [place] [address]    Troponin I (High Sensitivity) 11/26/2021 2  <18 ng/L Final   Comment: (NOTE) Elevated high sensitivity troponin I (hsTnI) values and significant  changes across serial measurements may suggest ACS but many other  chronic and acute conditions are known to elevate hsTnI results.  Refer to the "Links" section for chest pain algorithms and additional  guidance. Performed at [facility], [place] [address], [place], [place] [address]    Total CK 11/26/2021 412 (H)  49 - 397 U/L Final   Performed at [facility], [place] [address] [place], [place] [address]    Allergies: Fish allergy, Shellfish allergy, and Shellfish allergy  PTA Medications: (Not in a hospital admission)   Medical Decision Making  Patient with complaint of suicidal ideation, patient states that he is unable to contract for safety at this moment.  Patient will be admitted to [facility] for continuous  assessment with follow-up by psychiatry on day shift on 03/31/22  -reviewed available lab  -placed admission orders  -resumed home medication -       Recommendations  Based on my evaluation the patient does not appear to have an emergency medical condition.   Ophelia Shoulder, NP 03/30/22  11:47 PM

## 2022-03-30 NOTE — Consult Note (Signed)
Telepsych Consultation   Reason for Consult:  psych consult Referring Physician:  Nestor [name] Location of Patient:  Brian [name] [id number] Location of Provider: [place] Department  Patient Identification: Brian [name] MRN:  [id number] Principal Diagnosis: Substance induced mood disorder ([place]) Diagnosis:  Principal Problem:   Substance induced mood disorder ([place]) Active Problems:   Polysubstance abuse ([place])   Amphetamine use disorder, severe, dependence ([place])   Total Time spent with patient: 20 minutes  Subjective:   Brian [name] is a 36 y.o. male patient admitted with substance induced disorder.  Patient presents laying in bed, says he "just woke up". Alert and oriented to person, place, and situation. "I've been drinking and shooting a lot dope. [name] been doing a lot of Fentanyl and that's what bought me in". Endorses daily Fentanyl use for past 2 months; UDS+amphetamines, THC. Denies any other "issues" outside of substance use. Last attempt at treatment was over 1 year; states that he is ready for treatment. He denies any suicidal or homicidal thoughts, auditory or visual hallucinations.    HPI:  Brian [name] is a 36 year old male patient with a past psychiatric history of polysubstance abuse, substance induced disorders, homelessness, malingering who presented to [facility] for chest pain after using meth and suicidal ideations. Patient was later [facility] by EDP for safety. UDS+amphetamine, THC, BAL<10.   Past Psychiatric History: polysubstance abuse, substance induced disorders, homelessness, malingering  Risk to Self:  pt denies Risk to Others:  pt denies Prior Inpatient Therapy:  pt denies Prior Outpatient Therapy:  pt denies  Past Medical History:  Past Medical History:  Diagnosis Date   Alcohol abuse    Anxiety    Bipolar 2 disorder ([place])    Cocaine abuse ([place])    Depression    Kidney stones    Methamphetamine abuse, episodic ([place])    Myocardial  infarction ([place])    Narcotic abuse ([place])    Peptic ulcer    Polysubstance abuse ([place])    Renal disorder    kidney stones    Past Surgical History:  Procedure Laterality Date   ESOPHAGOGASTRODUODENOSCOPY N/A 05/29/2014   Procedure: ESOPHAGOGASTRODUODENOSCOPY (EGD) ;  Surgeon: Beryle Beams, MD;  Location: [facility] ENDOSCOPY;  Service: Endoscopy;  Laterality: N/A;  check with Dr. Collene Mares about sedation type/timinng - I recommend MAC   HAND SURGERY  2006   LEFT HEART CATH AND CORONARY ANGIOGRAPHY N/A 06/21/2020   Procedure: LEFT HEART CATH AND CORONARY ANGIOGRAPHY;  Surgeon: Nelva Bush, MD;  Location: [place] CV LAB;  Service: Cardiovascular;  Laterality: N/A;   Family History:  Family History  Problem Relation Age of Onset   Heart disease Father    Family Psychiatric  History: not noted Social History:  Social History   Substance and Sexual Activity  Alcohol Use Not Currently     Social History   Substance and Sexual Activity  Drug Use Yes   Types: Marijuana, Cocaine, IV, Methamphetamines   Comment: "meth yesterday morning" per pt    Social History   Socioeconomic History   Marital status: Single    Spouse name: Not on file   Number of children: Not on file   Years of education: Not on file   Highest education level: Not on file  Occupational History   Not on file  Tobacco Use   Smoking status: Every Day    Packs/day: 0.50    Types: Cigarettes   Smokeless tobacco: Never  Vaping Use  Vaping Use: Never used  Substance and Sexual Activity   Alcohol use: Not Currently   Drug use: Yes    Types: Marijuana, Cocaine, IV, Methamphetamines    Comment: "meth yesterday morning" per pt   Sexual activity: Not Currently  Other Topics Concern   Not on file  Social History Narrative   ** Merged History Encounter **       Lives with a friend   Lost house and custody of son 9broke up with fiancee)   Nature conservation officer   Social Determinants of Systems developer Strain: Not on file  Food Insecurity: Not on file  Transportation Needs: Not on file  Physical Activity: Not on file  Stress: Not on file  Social Connections: Not on file   Additional Social History:    Allergies:   Allergies  Allergen Reactions   Fish Allergy Anaphylaxis   Shellfish Allergy Anaphylaxis   Shellfish Allergy Anaphylaxis    Labs:  Results for orders placed or performed during the hospital encounter of 03/29/22 (from the past 48 hour(s))  Comprehensive metabolic panel     Status: Abnormal   Collection Time: 03/29/22  8:40 PM  Result Value Ref Range   Sodium 142 135 - 145 mmol/L   Potassium 3.5 3.5 - 5.1 mmol/L   Chloride 110 98 - 111 mmol/L   CO2 24 22 - 32 mmol/L   Glucose, Bld 108 (H) 70 - 99 mg/dL    Comment: Glucose reference range applies only to samples taken after fasting for at least 8 hours.   BUN 13 6 - 20 mg/dL   Creatinine, Ser 1.19 0.61 - 1.24 mg/dL   Calcium 9.2 8.9 - 10.3 mg/dL   Total Protein 7.7 6.5 - 8.1 g/dL   Albumin 4.2 3.5 - 5.0 g/dL   AST 61 (H) 15 - 41 U/L   ALT 48 (H) 0 - 44 U/L   Alkaline Phosphatase 64 38 - 126 U/L   Total Bilirubin 0.9 0.3 - 1.2 mg/dL   GFR, Estimated >60 >60 mL/min    Comment: (NOTE) Calculated using the CKD-EPI Creatinine Equation (2021)    Anion gap 8 5 - 15    Comment: Performed at [facility], [place] [address] [place], [place] [address]  Ethanol     Status: None   Collection Time: 03/29/22  8:40 PM  Result Value Ref Range   Alcohol, Ethyl (B) <10 <10 mg/dL    Comment: (NOTE) Lowest detectable limit for serum alcohol is 10 mg/dL.  For medical purposes only. Performed at [facility], [place] [address], [place], [place] [address]   Salicylate level     Status: Abnormal   Collection Time: 03/29/22  8:40 PM  Result Value Ref Range   Salicylate Lvl <6.3 (L) 7.0 - 30.0 mg/dL    Comment: Performed at [facility], [place] [address]  [place], [place] [address]  Acetaminophen level     Status: Abnormal   Collection Time: 03/29/22  8:40 PM  Result Value Ref Range   Acetaminophen (Tylenol), Serum <10 (L) 10 - 30 ug/mL    Comment: (NOTE) Therapeutic concentrations vary significantly. A range of 10-30 ug/mL  may be an effective concentration for many patients. However, some  are best treated at concentrations outside of this range. Acetaminophen concentrations >150 ug/mL at 4 hours after ingestion  and >50 ug/mL at 12 hours after ingestion are often associated with  toxic reactions.  Performed at  [facility], [place] [address], [place], [place] [address]   cbc     Status: Abnormal   Collection Time: 03/29/22  8:40 PM  Result Value Ref Range   WBC 7.5 4.0 - 10.5 K/uL   RBC 4.21 (L) 4.22 - 5.81 MIL/uL   Hemoglobin 12.2 (L) 13.0 - 17.0 g/dL   HCT 38.1 (L) 39.0 - 52.0 %   MCV 90.5 80.0 - 100.0 fL   MCH 29.0 26.0 - 34.0 pg   MCHC 32.0 30.0 - 36.0 g/dL   RDW 15.3 11.5 - 15.5 %   Platelets 228 150 - 400 K/uL   nRBC 0.0 0.0 - 0.2 %    Comment: Performed at [facility], [place] [address], [place], [place] [address]  Troponin I (High Sensitivity)     Status: None   Collection Time: 03/29/22  8:40 PM  Result Value Ref Range   Troponin I (High Sensitivity) 3 <18 ng/L    Comment: (NOTE) Elevated high sensitivity troponin I (hsTnI) values and significant  changes across serial measurements may suggest ACS but many other  chronic and acute conditions are known to elevate hsTnI results.  Refer to the "Links" section for chest pain algorithms and additional  guidance. Performed at [facility], [place] [address] [place], [place] [address]   SARS Coronavirus 2 by RT PCR (hospital order, performed in [facility] hospital lab) *cepheid single result test* Anterior Nasal Swab     Status: None   Collection Time: 03/29/22  8:43 PM   Specimen: Anterior Nasal Swab  Result Value Ref  Range   SARS Coronavirus 2 by RT PCR NEGATIVE NEGATIVE    Comment: (NOTE) SARS-CoV-2 target nucleic acids are NOT DETECTED.  The SARS-CoV-2 RNA is generally detectable in upper and lower respiratory specimens during the acute phase of infection. The lowest concentration of SARS-CoV-2 viral copies this assay can detect is 250 copies / mL. A negative result does not preclude SARS-CoV-2 infection and should not be used as the sole basis for treatment or other patient management decisions.  A negative result may occur with improper specimen collection / handling, submission of specimen other than nasopharyngeal swab, presence of viral mutation(s) within the areas targeted by this assay, and inadequate number of viral copies (<250 copies / mL). A negative result must be combined with clinical observations, patient history, and epidemiological information.  Fact Sheet for Patients:   https://www.patel.info/  Fact Sheet for Healthcare Providers: https://hall.com/  This test is not yet approved or  cleared by the [place] FDA and has been authorized for detection and/or diagnosis of SARS-CoV-2 by FDA under an Emergency Use Authorization (EUA).  This EUA will remain in effect (meaning this test can be used) for the duration of the COVID-19 declaration under Section 564(b)(1) of the Act, 21 U.S.C. section 360bbb-3(b)(1), unless the authorization is terminated or revoked sooner.  Performed at [facility], [place] [address], [place], [place] [address]   Rapid urine drug screen (hospital performed)     Status: Abnormal   Collection Time: 03/29/22 10:30 PM  Result Value Ref Range   Opiates NONE DETECTED NONE DETECTED   Cocaine NONE DETECTED NONE DETECTED   Benzodiazepines NONE DETECTED NONE DETECTED   Amphetamines POSITIVE (A) NONE DETECTED   Tetrahydrocannabinol POSITIVE (A) NONE DETECTED   Barbiturates NONE DETECTED NONE  DETECTED    Comment: (NOTE) DRUG SCREEN FOR MEDICAL PURPOSES ONLY.  IF CONFIRMATION IS NEEDED FOR ANY  PURPOSE, NOTIFY LAB WITHIN 5 DAYS.  LOWEST DETECTABLE LIMITS FOR URINE DRUG SCREEN Drug Class                     Cutoff (ng/mL) Amphetamine and metabolites    1000 Barbiturate and metabolites    200 Benzodiazepine                 921 Tricyclics and metabolites     300 Opiates and metabolites        300 Cocaine and metabolites        300 THC                            50 Performed at [facility], [place] [address], [place], [place] [address]   Resp Panel by RT-PCR (Flu A&B, Covid) Anterior Nasal Swab     Status: None   Collection Time: 03/30/22  1:26 AM   Specimen: Anterior Nasal Swab  Result Value Ref Range   SARS Coronavirus 2 by RT PCR NEGATIVE NEGATIVE    Comment: (NOTE) SARS-CoV-2 target nucleic acids are NOT DETECTED.  The SARS-CoV-2 RNA is generally detectable in upper respiratory specimens during the acute phase of infection. The lowest concentration of SARS-CoV-2 viral copies this assay can detect is 138 copies/mL. A negative result does not preclude SARS-Cov-2 infection and should not be used as the sole basis for treatment or other patient management decisions. A negative result may occur with  improper specimen collection/handling, submission of specimen other than nasopharyngeal swab, presence of viral mutation(s) within the areas targeted by this assay, and inadequate number of viral copies(<138 copies/mL). A negative result must be combined with clinical observations, patient history, and epidemiological information. The expected result is Negative.  Fact Sheet for Patients:  EntrepreneurPulse.com.au  Fact Sheet for Healthcare Providers:  IncredibleEmployment.be  This test is no t yet approved or cleared by the [place] FDA and  has been authorized for detection and/or diagnosis of SARS-CoV-2  by FDA under an Emergency Use Authorization (EUA). This EUA will remain  in effect (meaning this test can be used) for the duration of the COVID-19 declaration under Section 564(b)(1) of the Act, 21 U.S.C.section 360bbb-3(b)(1), unless the authorization is terminated  or revoked sooner.       Influenza A by PCR NEGATIVE NEGATIVE   Influenza B by PCR NEGATIVE NEGATIVE    Comment: (NOTE) The Xpert Xpress SARS-CoV-2/FLU/RSV plus assay is intended as an aid in the diagnosis of influenza from Nasopharyngeal swab specimens and should not be used as a sole basis for treatment. Nasal washings and aspirates are unacceptable for Xpert Xpress SARS-CoV-2/FLU/RSV testing.  Fact Sheet for Patients: EntrepreneurPulse.com.au  Fact Sheet for Healthcare Providers: IncredibleEmployment.be  This test is not yet approved or cleared by the [place] FDA and has been authorized for detection and/or diagnosis of SARS-CoV-2 by FDA under an Emergency Use Authorization (EUA). This EUA will remain in effect (meaning this test can be used) for the duration of the COVID-19 declaration under Section 564(b)(1) of the Act, 21 U.S.C. section 360bbb-3(b)(1), unless the authorization is terminated or revoked.  Performed at [facility], [place] [address] [place], [place] [address]     Medications:  Current Facility-Administered Medications  Medication Dose Route Frequency Provider Last Rate Last Admin   atorvastatin (LIPITOR) tablet 80 mg  80 mg Oral QHS Quintella Reichert, MD  buPROPion (WELLBUTRIN XL) 24 hr tablet 300 mg  300 mg Oral Daily Quintella Reichert, MD       gabapentin (NEURONTIN) capsule 300 mg  300 mg Oral TID Quintella Reichert, MD       LORazepam (ATIVAN) injection 0-4 mg  0-4 mg Intravenous Q6H Smoot, Sarah A, PA-C       Or   LORazepam (ATIVAN) tablet 0-4 mg  0-4 mg Oral Q6H Smoot, Sarah A, PA-C       [START ON 04/01/2022] LORazepam  (ATIVAN) injection 0-4 mg  0-4 mg Intravenous Q12H Smoot, Sarah A, PA-C       Or   [START ON 04/01/2022] LORazepam (ATIVAN) tablet 0-4 mg  0-4 mg Oral Q12H Smoot, Sarah A, PA-C       QUEtiapine (SEROQUEL) tablet 200 mg  200 mg Oral QHS Quintella Reichert, MD       thiamine tablet 100 mg  100 mg Oral Daily Smoot, Sarah A, PA-C       Or   thiamine (B-1) injection 100 mg  100 mg Intravenous Daily Smoot, Leary Roca, PA-C       Current Outpatient Medications  Medication Sig Dispense Refill   Buprenorphine HCl-Naloxone HCl 2-0.5 MG FILM Place 1 strip under the tongue 3 (three) times daily.     buPROPion (WELLBUTRIN XL) 300 MG 24 hr tablet Take 300 mg by mouth daily.     gabapentin (NEURONTIN) 300 MG capsule Take 300 mg by mouth 3 (three) times daily.     LIPITOR 80 MG tablet Take 80 mg by mouth at bedtime.     QUEtiapine (SEROQUEL) 200 MG tablet Take 1 tablet (200 mg total) by mouth 2 (two) times daily. (Patient taking differently: Take 200 mg by mouth at bedtime.) 30 tablet 1   dicyclomine (BENTYL) 20 MG tablet Take 1 tablet (20 mg total) by mouth 2 (two) times daily as needed for spasms (abdominal cramping). (Patient not taking: Reported on 03/29/2022) 20 tablet 0   isosorbide mononitrate (IMDUR) 30 MG 24 hr tablet Take 15 mg by mouth daily.     polyethylene glycol (MIRALAX) 17 g packet Take 17 g by mouth 2 (two) times daily. (Patient not taking: Reported on 03/29/2022) 14 each 0    Musculoskeletal: Strength & Muscle Tone: within normal limits Gait & Station: normal Patient leans: N/A  Psychiatric Specialty Exam:  Presentation  General Appearance: Casual  Eye Contact:Good  Speech:Clear and Coherent  Speech Volume:Normal  Handedness:Right   Mood and Affect  Mood:Euthymic  Affect:Blunt; Congruent; Appropriate   Thought Process  Thought Processes:Goal Directed  Descriptions of Associations:Intact  Orientation:Full (Time, Place and Person)  Thought Content:WDL  History of  Schizophrenia/Schizoaffective disorder:No  Duration of Psychotic Symptoms:No data recorded Hallucinations:Hallucinations: None  Ideas of Reference:None  Suicidal Thoughts:Suicidal Thoughts: No  Homicidal Thoughts:Homicidal Thoughts: No   Sensorium  Memory:Immediate Fair; Recent Fair; Remote Fair  Judgment:Fair  Insight:Fair; Shallow   Executive Functions  Concentration:Fair  Attention Span:Fair  [place]   Psychomotor Activity  Psychomotor Activity:Psychomotor Activity: Normal  Assets  Assets:Physical Health; Resilience; Communication Skills   Sleep  Sleep:Sleep: Good   Physical Exam: Physical Exam Vitals and nursing note reviewed.  Constitutional:      General: He is not in acute distress.    Appearance: He is normal weight. He is not ill-appearing or toxic-appearing.  HENT:     Head: Normocephalic.  Cardiovascular:     Rate and Rhythm: Normal rate.  Pulmonary:  Effort: Pulmonary effort is normal.  Abdominal:     Palpations: Abdomen is soft.  Musculoskeletal:        General: Normal range of motion.     Cervical back: Normal range of motion.  Skin:    General: Skin is dry.  Neurological:     General: No focal deficit present.     Mental Status: He is alert.  Psychiatric:        Attention and Perception: Attention and perception normal.        Mood and Affect: Mood and affect normal.        Speech: Speech normal.        Behavior: Behavior is cooperative.        Thought Content: Thought content normal. Thought content does not include homicidal or suicidal ideation. Thought content does not include homicidal or suicidal plan.        Cognition and Memory: Cognition and memory normal.        Judgment: Judgment normal.   Review of Systems  Psychiatric/Behavioral:  Positive for substance abuse.   All other systems reviewed and are negative. Blood pressure 110/72, pulse 60, temperature 98 F (36.7 C),  temperature source Oral, resp. rate 16, height _0  (1.727 m), weight 68 kg, SpO2 100 %. Body mass index is 22.81 kg/m.  Treatment Plan Summary: Plan Patient psychiatrically cleared; TOC consult placed to assist with substance abuse resources.   Disposition: No evidence of imminent risk to self or others at present.   Patient does not meet criteria for psychiatric inpatient admission. Supportive therapy provided about ongoing stressors. Discussed crisis plan, support from social network, calling 911, coming to the Emergency Department, and calling Suicide Hotline.  This service was provided via telemedicine using a 2-way, interactive audio and video technology.  Names of all persons participating in this telemedicine service and their role in this encounter. Name: [name] Role: PMHNP  Name: Hampton Abbot Role: Attending MD  Name: [name] Role: patient  Name:  Role:     Inda Merlin, NP 03/30/2022 8:57 AM

## 2022-03-30 NOTE — Progress Notes (Signed)
Inpatient and outpatient substance abuse resources attached to AVS.

## 2022-03-31 ENCOUNTER — Encounter (HOSPITAL_COMMUNITY): Payer: Self-pay | Admitting: Emergency Medicine

## 2022-03-31 DIAGNOSIS — Z20822 Contact with and (suspected) exposure to covid-19: Secondary | ICD-10-CM | POA: Diagnosis not present

## 2022-03-31 DIAGNOSIS — F319 Bipolar disorder, unspecified: Secondary | ICD-10-CM | POA: Diagnosis not present

## 2022-03-31 DIAGNOSIS — R45851 Suicidal ideations: Secondary | ICD-10-CM | POA: Diagnosis not present

## 2022-03-31 DIAGNOSIS — F1910 Other psychoactive substance abuse, uncomplicated: Secondary | ICD-10-CM | POA: Diagnosis not present

## 2022-03-31 LAB — RESP PANEL BY RT-PCR (FLU A&B, COVID) ARPGX2
Influenza A by PCR: NEGATIVE
Influenza B by PCR: NEGATIVE
SARS Coronavirus 2 by RT PCR: NEGATIVE

## 2022-03-31 MED ORDER — ISOSORBIDE MONONITRATE ER 30 MG PO TB24: 15.0000 mg | ORAL_TABLET | Freq: Every day | ORAL | 1 refills | Status: DC

## 2022-03-31 MED ORDER — LOPERAMIDE HCL 2 MG PO CAPS: 2.0000 mg | ORAL_CAPSULE | ORAL | Status: DC | PRN

## 2022-03-31 MED ORDER — LIPITOR 80 MG PO TABS: 80.0000 mg | ORAL_TABLET | Freq: Every day | ORAL | 1 refills | Status: DC

## 2022-03-31 MED ORDER — DICYCLOMINE HCL 20 MG PO TABS: 20.0000 mg | ORAL_TABLET | Freq: Four times a day (QID) | ORAL | Status: DC | PRN

## 2022-03-31 MED ORDER — THIAMINE HCL 100 MG PO TABS
100.0000 mg | ORAL_TABLET | Freq: Every day | ORAL | Status: DC
  Administered 2022-04-01: 100 mg via ORAL
  Filled 2022-03-31: qty 1

## 2022-03-31 MED ORDER — TRAZODONE HCL 50 MG PO TABS: 50.0000 mg | ORAL_TABLET | Freq: Every evening | ORAL | 1 refills | Status: DC | PRN

## 2022-03-31 MED ORDER — BUPROPION HCL ER (XL) 300 MG PO TB24: 300.0000 mg | ORAL_TABLET | Freq: Every day | ORAL | Status: DC

## 2022-03-31 MED ORDER — QUETIAPINE FUMARATE 200 MG PO TABS: 200.0000 mg | ORAL_TABLET | Freq: Every day | ORAL | 1 refills | Status: DC

## 2022-03-31 MED ORDER — BUPROPION HCL ER (XL) 300 MG PO TB24: 300.0000 mg | ORAL_TABLET | Freq: Every day | ORAL | 1 refills | Status: DC

## 2022-03-31 MED ORDER — METHOCARBAMOL 500 MG PO TABS: 500.0000 mg | ORAL_TABLET | Freq: Three times a day (TID) | ORAL | Status: DC | PRN

## 2022-03-31 MED ORDER — ADULT MULTIVITAMIN W/MINERALS CH
1.0000 | ORAL_TABLET | Freq: Every day | ORAL | Status: DC
  Administered 2022-03-31: 1 via ORAL
  Filled 2022-03-31 (×2): qty 1

## 2022-03-31 MED ORDER — NAPROXEN 500 MG PO TABS: 500.0000 mg | ORAL_TABLET | Freq: Two times a day (BID) | ORAL | Status: DC | PRN

## 2022-03-31 MED ORDER — BUPROPION HCL ER (XL) 300 MG PO TB24
300.0000 mg | ORAL_TABLET | Freq: Every day | ORAL | Status: DC
  Administered 2022-04-01: 300 mg via ORAL
  Filled 2022-03-31: qty 1
  Filled 2022-03-31: qty 14

## 2022-03-31 MED ORDER — GABAPENTIN 300 MG PO CAPS: 300.0000 mg | ORAL_CAPSULE | Freq: Three times a day (TID) | ORAL | 1 refills | Status: DC

## 2022-03-31 MED ORDER — LORAZEPAM 1 MG PO TABS: 1.0000 mg | ORAL_TABLET | Freq: Four times a day (QID) | ORAL | Status: DC | PRN

## 2022-03-31 MED ORDER — ONDANSETRON 4 MG PO TBDP: 4.0000 mg | ORAL_TABLET | Freq: Four times a day (QID) | ORAL | Status: DC | PRN

## 2022-03-31 MED ORDER — THIAMINE HCL 100 MG/ML IJ SOLN
100.0000 mg | Freq: Once | INTRAMUSCULAR | Status: AC
  Administered 2022-03-31: 100 mg via INTRAMUSCULAR
  Filled 2022-03-31: qty 2

## 2022-03-31 NOTE — ED Notes (Signed)
Patient A&Ox4. Patient admits to [facility] without plan or intent. Patient denies HI at this time. Denies A/VH. Patient denies any physical complaints when asked. No acute distress noted. Support and encouragement provided. Routine safety checks conducted according to facility protocol. Encouraged patient to notify staff if thoughts of harm toward self or others arise. Patient verbalize understanding and agreement. Will continue to monitor for safety.

## 2022-03-31 NOTE — ED Notes (Signed)
Pt was given cereal this morning

## 2022-03-31 NOTE — ED Notes (Signed)
Patient resting quietly in bed with eyes closed, Respirations equal and unlabored, skin warm and dry, NAD. No change in assessment or acuity. Routine safety checks conducted according to facility protocol. Will continue to monitor for safety.      

## 2022-03-31 NOTE — ED Notes (Signed)
Pt A&O x 4, presents with suicidal ideations, plan to shoot self or overdose on Fentanyl. Pt is depressed and homeless.  Denies HI or AVH.  Monitoring for safety.
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
## 2022-03-31 NOTE — ED Notes (Signed)
Snacks given 

## 2022-03-31 NOTE — ED Notes (Signed)
Pt sleeping at present, no distress noted.  Monitoring for safety. 

## 2022-03-31 NOTE — ED Notes (Signed)
Pt arrived at [facility], took a shower and drinking coffee in [place] [name]

## 2022-03-31 NOTE — Progress Notes (Signed)
Patient is guarded, sarcastic and avoidant.  He has poor eye contact and is difficult to engage in a conversation.  He deflects as a defense mechanism and has difficulty standing still.  Patient was given recovery materials and encouraged to read them and start the first step worksheet.  Patient  verbalizes a desire to stop using methamphetamine although has a limited insight into the recovery process.  He responded that "I just need to get out of here."  Support provided and education attempted.  Will continue to monitor for signs of withdrawal and provide a safe environment.

## 2022-03-31 NOTE — Discharge Instructions (Addendum)
Take all medications as prescribed by his/her mental healthcare provider. Report any adverse effects and or reactions from the medicines to your outpatient provider promptly. Do not engage in alcohol and or illegal drug use while on prescription medicines. In the event of worsening symptoms, call the crisis hotline, 911 and or go to the nearest ED for appropriate evaluation and treatment of symptoms. follow-up with your primary care provider for your other medical issues, concerns and or health care needs.    Please come to [facility] (this facility) during walk in hours for appointment with psychiatrist/provider for further medication management and for therapists for therapy.    Walk-Ins for medication management  are available on Monday, Wednesday, Thursday and Friday from 8am-11am.  It is first come, first -serve; it is best to arrive by 7:00 AM.    Walk-Ins for therapy are  available on Monday and Wednesday's  8am-11am.  It is first come, first -serve; it is best to arrive by 7:00 AM.    When you arrive please go upstairs for your appointment. If you are unsure of where to go, inform the front desk that you are here for a walk in appointment and they will assist you with directions upstairs.  Address:  [address] in [place], [place] Ph: [phone]        Refills will not be given by the physician you saw on this admission as this physician is not your regular outpatient provider. Please follow up with your outpatient provider for refills. If you do  not have a psychiatrist or psychiatry provider, you may ask your primary care provider for assistance with refills

## 2022-03-31 NOTE — ED Provider Notes (Addendum)
Facility Based Crisis Admission H&P  Date: 03/31/22 Patient Name: Brian [name] MRN: [id number] Chief Complaint:  Chief Complaint  Patient presents with   Suicidal   Addiction Problem      Diagnoses:  Final diagnoses:  Substance induced mood disorder ([place])  Suicide ideation    HPI: Patient is a 36 year old male with past psychiatric history of bipolar disorder, depression, polysubstance abuse (meth, cannabis, opioids, alcohol), homelessness, and malingering admitted to [facility] for substance abuse treatment.  Patient was seen yesterday and was observed overnight at [facility] before [facility] admission on 03/31/2022  Evaluation by NP at [facility] on 03/30/2022-  Brian [name] is a 36 year old male with a psychiatric history of bipolar disorder, polysubstance abuse (meth, cannabis, alcohol, opioids), depression, and suicidal ideation.  Patient presented voluntarily to [facility] complaining of worsening depression, suicidal ideation, and requesting substance abuse treatment.   Off note, patient presented to [facility] on 06/03 with similar complaint he was evaluated and observed overnight.  Patient was psychiatrically cleared and discharged earlier today 03/30/22.   This nurse practitioner and reviewed patient's chart and met with him face-to-face.  On assessment, patient is alert and oriented x4.  Patient is calm and cooperative.  Patient's speech is clear and coherent, patient makes good eye contact.  Patient's mood is anxious with congruent affect and his his thought process is coherent.   Patient reported that he is depressed and having suicidal ideation with plan to overdose on fentanyl, shoot himself, or jump off a bridge.  Patient identifies recent break-up from his girlfriend as his main stressor.  He says he was untruthful about his depressive symptoms and suicidal ideation while being evaluated in the emergency room today.  He is endorsing depressive symptoms of crying, hopelessness, worthlessness, poor  sleep, poor concentration, and increased anxiety.   Patient reported that he is unable to contract for safety and continues to endorse suicidal ideation with plan to harm himself if discharged.  Patient denies homicidal ideation, paranoia, and hallucinations.  Patient endorses using methamphetamine and fentanyl daily however he denies using these substances today.  Patient reports that he drank "2 [employer]'s hard lemonade" today 03-30-2022.  He denies all other substance use.  Patient says he would like to detox and then enroll in substance abuse treatment program. Patient says he plans to follow up with [facility] for residential substance abuse treatment.    Evaluation on 03/31/2022-Patient was seen this morning.  Patient states he is here for substance abuse treatment.  He reports that he is feeling tired but overall better than yesterday.  He is still feeling depressed and anxious.  He slept well last night and reports stable appetite.  He reports that he has not eaten his breakfast yet.  He reports that he has been using fentanyl, marijuana, meth, and alcohol.  He last use fentanyl 3 days ago and drank a few beers yesterday.  He states he wants to go to [facility] for treatment for minimum 7 days and possibly long-term.  He is currently homeless and was living on streets.  He reports that when he came in yesterday he was having SI.  Currently, he denies having any active suicidal ideations but reports passive suicidal ideations without a plan or intent.  He denies homicidal ideations, and auditory and visual hallucinations.  He denies any physical symptoms today.  Patient reports that he drinks 1-2 hard lemonade here and there but does not drink every day.  He used fentanyl last week after recent  break-up with girlfriend.  He uses meth every day  and has been using that for many years.  He got sober for 1 week in February and then started using again.  He has been to multiple rehab facilities in the past with most  recent rehab admission in 2019.  He reports that he has not been compliant with his psych medications.  He reports family history of bipolar and grand mother and substance abuse history in multiple members of his family.  He reports 8 previous suicidal attempts.  He denies access to guns.  He denies any problem with law enforcement.  He reports medical history of high cholesterol. He was started on Wellbutrin XL 300 mg, Neurontin 300 mg 3 times daily, Atarax, Seroquel 200 mg nightly and trazodone 50 mg nightly as needed for sleep at [facility].   His UDS was positive for amphetamine, methamphetamine, and THC and AST (61), ALT (48) mildly elevated.  CBC shows mild anemia with hemoglobin 12.2, HCT 38.1.  Patient agreed to go to [facility] for substance abuse treatment and to wait for bed at [facility] or other substance abuse facility.  Chart review shows that-patient has been admitted to psychiatric unit multiple times at [facility], [facility], and [facility] with most recent admission at [facility] from 12/21/2020 to 12/24/2020 when he was admitted for drug overdose on a mixture of methamphetamine, and gabapentin and AMS.  At that time, patient was discharged on Wellbutrin XL 300 mg daily, and Seroquel 200 mg nightly.   PHQ 2-9:  [place] ED from 05/02/2020 in [place] DEPT  Thoughts that you would be better off dead, or of hurting yourself in some way More than half the days  PHQ-9 Total Score 12       [place] ED from 03/30/2022 in [facility] ED from 03/29/2022 in [facility] ED from 03/25/2022 in [place] DEPT  C-SSRS RISK CATEGORY No Risk High Risk No Risk        Total Time spent with patient: 45 minutes  Musculoskeletal  Strength & Muscle Tone: within normal limits Gait & Station: normal Patient leans: N/A  Psychiatric Specialty Exam  Presentation General Appearance: Appropriate  for Environment  Eye Contact:Fair  Speech:Clear and Coherent  Speech Volume:Normal  Handedness:Right   Mood and Affect  Mood:Depressed  Affect:Congruent; Constricted   Thought Process  Thought Processes:Coherent  Descriptions of Associations:Intact  Orientation:Full (Time, Place and Person)  Thought Content:WDL  Diagnosis of Schizophrenia or Schizoaffective disorder in past: No   Hallucinations:Hallucinations: None  Ideas of Reference:None  Suicidal Thoughts:Suicidal Thoughts: Yes, Passive SI Active Intent and/or Plan: With Plan SI Passive Intent and/or Plan: Without Intent; Without Plan (Contracts for safety at this time)  Homicidal Thoughts:Homicidal Thoughts: No   Sensorium  Memory:Immediate Good; Recent Good; Remote Fair  Judgment:Poor  Insight:Good   Executive Functions  Concentration:Good  Attention Span:Fair  [place]  Language:Good   Psychomotor Activity  Psychomotor Activity:Psychomotor Activity: Normal   Assets  Assets:Communication Skills; Desire for Improvement; Physical Health   Sleep  Sleep:Sleep: Good Number of Hours of Sleep: 6   Nutritional Assessment (For OBS and FBC admissions only) Has the patient had a weight loss or gain of 10 pounds or more in the last 3 months?: No Has the patient had a decrease in food intake/or appetite?: No Does the patient have dental problems?: No Does the patient have eating habits or behaviors  that may be indicators of an eating disorder including binging or inducing vomiting?: No Has the patient recently lost weight without trying?: 0 Has the patient been eating poorly because of a decreased appetite?: 0 Malnutrition Screening Tool Score: 0    Physical Exam Vitals and nursing note reviewed.  Constitutional:      General: He is not in acute distress.    Appearance: Normal appearance. He is not ill-appearing, toxic-appearing or diaphoretic.  HENT:     Head:  Normocephalic.  Cardiovascular:     Pulses: Normal pulses.  Neurological:     Mental Status: He is alert and oriented to person, place, and time.   Review of Systems  Constitutional:  Negative for chills and fever.  Respiratory:  Negative for cough and shortness of breath.   Cardiovascular:  Negative for chest pain.  Gastrointestinal:  Negative for abdominal pain, diarrhea, nausea and vomiting.  Neurological:  Negative for dizziness and headaches.  Psychiatric/Behavioral:  Positive for depression and suicidal ideas. Negative for hallucinations. The patient does not have insomnia.        Passive SI. Contracts for safety   Blood pressure (!) 140/98, pulse 78, temperature 98.2 F (36.8 C), temperature source Oral, resp. rate 18, SpO2 100 %. There is no height or weight on file to calculate BMI.  Past Psychiatric History: bipolar disorder, polysubstance abuse (meth, cannabis, alcohol, opioids), depression, and suicidal ideation.   Is the patient at risk to self? No  Has the patient been a risk to self in the past 6 months? No .    Has the patient been a risk to self within the distant past? Yes   Is the patient a risk to others? No   Has the patient been a risk to others in the past 6 months? No   Has the patient been a risk to others within the distant past? 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 Collene Mares about sedation  type/timinng - I recommend MAC   HAND SURGERY  2006   LEFT HEART CATH AND CORONARY ANGIOGRAPHY N/A 06/21/2020   Procedure: LEFT HEART CATH AND CORONARY ANGIOGRAPHY;  Surgeon: Nelva Bush, MD;  Location: [facility];  Service: Cardiovascular;  Laterality: N/A;    Family History:  Family History  Problem Relation Age of Onset   Heart disease Father   Family psychiatric history-grandmother-bipolar Substance abuse  in multiple members of his family  Social History:  Social History   Socioeconomic History   Marital status: Single    Spouse name: Not on file   Number of children: Not on file   Years of education: Not on file   Highest education level: Not on file  Occupational History   Not on file  Tobacco Use   Smoking status: Every Day    Packs/day:  0.50    Types: Cigarettes   Smokeless tobacco: Never  Vaping Use   Vaping Use: Never used  Substance and Sexual Activity   Alcohol use: Not Currently   Drug use: Yes    Types: Marijuana, Cocaine, IV, Methamphetamines    Comment: "meth yesterday morning" per pt   Sexual activity: Not Currently  Other Topics Concern   Not on file  Social History Narrative   ** Merged History Encounter **       Lives with a friend   Lost house and custody of son 9broke up with fiancee)   Nature conservation officer   Social Determinants of Radio broadcast assistant Strain: Not on file  Food Insecurity: Not on file  Transportation Needs: Not on file  Physical Activity: Not on file  Stress: Not on file  Social Connections: Not on file  Intimate Partner Violence: Not on file    SDOH:  SDOH Screenings   Alcohol Screen: Not on file  Depression (PHQ2-9): Not on file  Financial Resource Strain: Not on file  Food Insecurity: Not on file  Housing: Not on file  Physical Activity: Not on file  Social Connections: Not on file  Stress: Not on file  Tobacco Use: High Risk   Smoking Tobacco Use: Every Day   Smokeless Tobacco Use:  Never   Passive Exposure: Not on file  Transportation Needs: Not on file    Last Labs:  Admission on 03/30/2022  Component Date Value Ref Range Status   SARS Coronavirus 2 by RT PCR 03/30/2022 NEGATIVE  NEGATIVE Final   Comment: (NOTE) SARS-CoV-2 target nucleic acids are NOT DETECTED.  The SARS-CoV-2 RNA is generally detectable in upper respiratory specimens during the acute phase of infection. The lowest concentration of SARS-CoV-2 viral copies this assay can detect is 138 copies/mL. A negative result does not preclude SARS-Cov-2 infection and should not be used as the sole basis for treatment or other patient management decisions. A negative result may occur with  improper specimen collection/handling, submission of specimen other than nasopharyngeal swab, presence of viral mutation(s) within the areas targeted by this assay, and inadequate number of viral copies(<138 copies/mL). A negative result must be combined with clinical observations, patient history, and epidemiological information. The expected result is Negative.  Fact Sheet for Patients:  EntrepreneurPulse.com.au  Fact Sheet for Healthcare Providers:  IncredibleEmployment.be  This test is no                          t yet approved or cleared by the [place] FDA and  has been authorized for detection and/or diagnosis of SARS-CoV-2 by FDA under an Emergency Use Authorization (EUA). This EUA will remain  in effect (meaning this test can be used) for the duration of the COVID-19 declaration under Section 564(b)(1) of the Act, 21 U.S.C.section 360bbb-3(b)(1), unless the authorization is terminated  or revoked sooner.       Influenza A by PCR 03/30/2022 NEGATIVE  NEGATIVE Final   Influenza B by PCR 03/30/2022 NEGATIVE  NEGATIVE Final   Comment: (NOTE) The Xpert Xpress SARS-CoV-2/FLU/RSV plus assay is intended as an aid in the diagnosis of influenza from Nasopharyngeal swab  specimens and should not be used as a sole basis for treatment. Nasal washings and aspirates are unacceptable for Xpert Xpress SARS-CoV-2/FLU/RSV testing.  Fact Sheet for Patients: EntrepreneurPulse.com.au  Fact Sheet for Healthcare Providers: IncredibleEmployment.be  This test is not yet approved or cleared by the  [place] Architectural technologist and has been authorized for detection and/or diagnosis of SARS-CoV-2 by FDA under an Print production planner (EUA). This EUA will remain in effect (meaning this test can be used) for the duration of the COVID-19 declaration under Section 564(b)(1) of the Act, 21 U.S.C. section 360bbb-3(b)(1), unless the authorization is terminated or revoked.  Performed at [facility], [place] [address], [place], [place] [address]    POC Amphetamine UR 03/30/2022 Positive (A)  NONE DETECTED (Cut Off Level 1000 ng/mL) Final   POC Secobarbital (BAR) 03/30/2022 None Detected  NONE DETECTED (Cut Off Level 300 ng/mL) Final   POC Buprenorphine (BUP) 03/30/2022 None Detected  NONE DETECTED (Cut Off Level 10 ng/mL) Final   POC Oxazepam (BZO) 03/30/2022 None Detected  NONE DETECTED (Cut Off Level 300 ng/mL) Final   POC Cocaine UR 03/30/2022 None Detected  NONE DETECTED (Cut Off Level 300 ng/mL) Final   POC Methamphetamine UR 03/30/2022 Positive (A)  NONE DETECTED (Cut Off Level 1000 ng/mL) Final   POC Morphine 03/30/2022 None Detected  NONE DETECTED (Cut Off Level 300 ng/mL) Final   POC Methadone UR 03/30/2022 None Detected  NONE DETECTED (Cut Off Level 300 ng/mL) Final   POC Oxycodone UR 03/30/2022 None Detected  NONE DETECTED (Cut Off Level 100 ng/mL) Final   POC Marijuana UR 03/30/2022 Positive (A)  NONE DETECTED (Cut Off Level 50 ng/mL) Final   SARSCOV2ONAVIRUS 2 AG 03/30/2022 NEGATIVE  NEGATIVE Final   Comment: (NOTE) SARS-CoV-2 antigen NOT DETECTED.   Negative results are presumptive.  Negative results do not preclude SARS-CoV-2  infection and should not be used as the sole basis for treatment or other patient management decisions, including infection  control decisions, particularly in the presence of clinical signs and  symptoms consistent with COVID-19, or in those who have been in contact with the virus.  Negative results must be combined with clinical observations, patient history, and epidemiological information. The expected result is Negative.  Fact Sheet for Patients: HandmadeRecipes.com.cy  Fact Sheet for Healthcare Providers: FuneralLife.at  This test is not yet approved or cleared by the [place] FDA and  has been authorized for detection and/or diagnosis of SARS-CoV-2 by FDA under an Emergency Use Authorization (EUA).  This EUA will remain in effect (meaning this test can be used) for the duration of  the COV                          ID-19 declaration under Section 564(b)(1) of the Act, 21 U.S.C. section 360bbb-3(b)(1), unless the authorization is terminated or revoked sooner.    Admission on 03/29/2022, Discharged on 03/30/2022  Component Date Value Ref Range Status   Sodium 03/29/2022 142  135 - 145 mmol/L Final   Potassium 03/29/2022 3.5  3.5 - 5.1 mmol/L Final   Chloride 03/29/2022 110  98 - 111 mmol/L Final   CO2 03/29/2022 24  22 - 32 mmol/L Final   Glucose, Bld 03/29/2022 108 (H)  70 - 99 mg/dL Final   Glucose reference range applies only to samples taken after fasting for at least 8 hours.   BUN 03/29/2022 13  6 - 20 mg/dL Final   Creatinine, Ser 03/29/2022 1.19  0.61 - 1.24 mg/dL Final   Calcium 03/29/2022 9.2  8.9 - 10.3 mg/dL Final   Total Protein 03/29/2022 7.7  6.5 - 8.1 g/dL Final   Albumin 03/29/2022 4.2  3.5 - 5.0 g/dL Final   AST 03/29/2022  61 (H)  15 - 41 U/L Final   ALT 03/29/2022 48 (H)  0 - 44 U/L Final   Alkaline Phosphatase 03/29/2022 64  38 - 126 U/L Final   Total Bilirubin 03/29/2022 0.9  0.3 - 1.2 mg/dL Final    GFR, Estimated 03/29/2022 >60  >60 mL/min Final   Comment: (NOTE) Calculated using the CKD-EPI Creatinine Equation (2021)    Anion gap 03/29/2022 8  5 - 15 Final   Performed at [facility], [place] [address], [place], [place] [address]   Alcohol, Ethyl (B) 03/29/2022 <10  <10 mg/dL Final   Comment: (NOTE) Lowest detectable limit for serum alcohol is 10 mg/dL.  For medical purposes only. Performed at [facility], [place] [address] [place], [place] [address]    Salicylate Lvl 88/41/6606 <7.0 (L)  7.0 - 30.0 mg/dL Final   Performed at [place] [address] [place], [place] [address]   Acetaminophen (Tylenol), Serum 03/29/2022 <10 (L)  10 - 30 ug/mL Final   Comment: (NOTE) Therapeutic concentrations vary significantly. A range of 10-30 ug/mL  may be an effective concentration for many patients. However, some  are best treated at concentrations outside of this range. Acetaminophen concentrations >150 ug/mL at 4 hours after ingestion  and >50 ug/mL at 12 hours after ingestion are often associated with  toxic reactions.  Performed at [facility], [place] [address], [place], [place] [address]    WBC 03/29/2022 7.5  4.0 - 10.5 K/uL Final   RBC 03/29/2022 4.21 (L)  4.22 - 5.81 MIL/uL Final   Hemoglobin 03/29/2022 12.2 (L)  13.0 - 17.0 g/dL Final   HCT 03/29/2022 38.1 (L)  39.0 - 52.0 % Final   MCV 03/29/2022 90.5  80.0 - 100.0 fL Final   MCH 03/29/2022 29.0  26.0 - 34.0 pg Final   MCHC 03/29/2022 32.0  30.0 - 36.0 g/dL Final   RDW 03/29/2022 15.3  11.5 - 15.5 % Final   Platelets 03/29/2022 228  150 - 400 K/uL Final   nRBC 03/29/2022 0.0  0.0 - 0.2 % Final   Performed at [facility], [place] [address] [place], [place] [address]   Opiates 03/29/2022 NONE DETECTED  NONE DETECTED Final   Cocaine 03/29/2022 NONE DETECTED  NONE DETECTED Final   Benzodiazepines 03/29/2022 NONE DETECTED  NONE  DETECTED Final   Amphetamines 03/29/2022 POSITIVE (A)  NONE DETECTED Final   Tetrahydrocannabinol 03/29/2022 POSITIVE (A)  NONE DETECTED Final   Barbiturates 03/29/2022 NONE DETECTED  NONE DETECTED Final   Comment: (NOTE) DRUG SCREEN FOR MEDICAL PURPOSES ONLY.  IF CONFIRMATION IS NEEDED FOR ANY PURPOSE, NOTIFY LAB WITHIN 5 DAYS.  LOWEST DETECTABLE LIMITS FOR URINE DRUG SCREEN Drug Class                     Cutoff (ng/mL) Amphetamine and metabolites    1000 Barbiturate and metabolites    200 Benzodiazepine                 220 Tricyclics and metabolites     300 Opiates and metabolites        300 Cocaine and metabolites        300 THC                            50 Performed at [facility], [place] [address] [place], [place] [address]  Troponin I (High Sensitivity) 03/29/2022 3  <18 ng/L Final   Comment: (NOTE) Elevated high sensitivity troponin I (hsTnI) values and significant  changes across serial measurements may suggest ACS but many other  chronic and acute conditions are known to elevate hsTnI results.  Refer to the "Links" section for chest pain algorithms and additional  guidance. Performed at [facility], [place] [address], [place], [place] [address]    SARS Coronavirus 2 by RT PCR 03/29/2022 NEGATIVE  NEGATIVE Final   Comment: (NOTE) SARS-CoV-2 target nucleic acids are NOT DETECTED.  The SARS-CoV-2 RNA is generally detectable in upper and lower respiratory specimens during the acute phase of infection. The lowest concentration of SARS-CoV-2 viral copies this assay can detect is 250 copies / mL. A negative result does not preclude SARS-CoV-2 infection and should not be used as the sole basis for treatment or other patient management decisions.  A negative result may occur with improper specimen collection / handling, submission of specimen other than nasopharyngeal swab, presence of viral mutation(s) within the areas targeted by  this assay, and inadequate number of viral copies (<250 copies / mL). A negative result must be combined with clinical observations, patient history, and epidemiological information.  Fact Sheet for Patients:   https://www.patel.info/  Fact Sheet for Healthcare Providers: https://hall.com/  This test is not yet approved or                           cleared by the [place] FDA and has been authorized for detection and/or diagnosis of SARS-CoV-2 by FDA under an Emergency Use Authorization (EUA).  This EUA will remain in effect (meaning this test can be used) for the duration of the COVID-19 declaration under Section 564(b)(1) of the Act, 21 U.S.C. section 360bbb-3(b)(1), unless the authorization is terminated or revoked sooner.  Performed at [facility], [place] [address] [place], [place] [address]    SARS Coronavirus 2 by RT PCR 03/30/2022 NEGATIVE  NEGATIVE Final   Comment: (NOTE) SARS-CoV-2 target nucleic acids are NOT DETECTED.  The SARS-CoV-2 RNA is generally detectable in upper respiratory specimens during the acute phase of infection. The lowest concentration of SARS-CoV-2 viral copies this assay can detect is 138 copies/mL. A negative result does not preclude SARS-Cov-2 infection and should not be used as the sole basis for treatment or other patient management decisions. A negative result may occur with  improper specimen collection/handling, submission of specimen other than nasopharyngeal swab, presence of viral mutation(s) within the areas targeted by this assay, and inadequate number of viral copies(<138 copies/mL). A negative result must be combined with clinical observations, patient history, and epidemiological information. The expected result is Negative.  Fact Sheet for Patients:  EntrepreneurPulse.com.au  Fact Sheet for Healthcare Providers:   IncredibleEmployment.be  This test is no                          t yet approved or cleared by the [place] FDA and  has been authorized for detection and/or diagnosis of SARS-CoV-2 by FDA under an Emergency Use Authorization (EUA). This EUA will remain  in effect (meaning this test can be used) for the duration of the COVID-19 declaration under Section 564(b)(1) of the Act, 21 U.S.C.section 360bbb-3(b)(1), unless the authorization is terminated  or revoked sooner.       Influenza A by PCR 03/30/2022 NEGATIVE  NEGATIVE Final  Influenza B by PCR 03/30/2022 NEGATIVE  NEGATIVE Final   Comment: (NOTE) The Xpert Xpress SARS-CoV-2/FLU/RSV plus assay is intended as an aid in the diagnosis of influenza from Nasopharyngeal swab specimens and should not be used as a sole basis for treatment. Nasal washings and aspirates are unacceptable for Xpert Xpress SARS-CoV-2/FLU/RSV testing.  Fact Sheet for Patients: EntrepreneurPulse.com.au  Fact Sheet for Healthcare Providers: IncredibleEmployment.be  This test is not yet approved or cleared by the [place] FDA and has been authorized for detection and/or diagnosis of SARS-CoV-2 by FDA under an Emergency Use Authorization (EUA). This EUA will remain in effect (meaning this test can be used) for the duration of the COVID-19 declaration under Section 564(b)(1) of the Act, 21 U.S.C. section 360bbb-3(b)(1), unless the authorization is terminated or revoked.  Performed at [facility], [place] [address] [place], [place] [address]   Admission on 12/09/2021, Discharged on 12/10/2021  Component Date Value Ref Range Status   Color, Urine 12/09/2021 YELLOW  YELLOW Final   APPearance 12/09/2021 CLEAR  CLEAR Final   Specific Gravity, Urine 12/09/2021 1.010  1.005 - 1.030 Final   pH 12/09/2021 6.0  5.0 - 8.0 Final   Glucose, UA 12/09/2021 NEGATIVE  NEGATIVE mg/dL Final    Hgb urine dipstick 12/09/2021 NEGATIVE  NEGATIVE Final   Bilirubin Urine 12/09/2021 NEGATIVE  NEGATIVE Final   Ketones, ur 12/09/2021 NEGATIVE  NEGATIVE mg/dL Final   Protein, ur 12/09/2021 NEGATIVE  NEGATIVE mg/dL Final   Nitrite 12/09/2021 NEGATIVE  NEGATIVE Final   Leukocytes,Ua 12/09/2021 NEGATIVE  NEGATIVE Final   Performed at [place] [address] [place], [place] [address]   WBC 12/09/2021 13.2 (H)  4.0 - 10.5 K/uL Final   RBC 12/09/2021 3.96 (L)  4.22 - 5.81 MIL/uL Final   Hemoglobin 12/09/2021 11.5 (L)  13.0 - 17.0 g/dL Final   HCT 12/09/2021 35.4 (L)  39.0 - 52.0 % Final   MCV 12/09/2021 89.4  80.0 - 100.0 fL Final   MCH 12/09/2021 29.0  26.0 - 34.0 pg Final   MCHC 12/09/2021 32.5  30.0 - 36.0 g/dL Final   RDW 12/09/2021 13.2  11.5 - 15.5 % Final   Platelets 12/09/2021 294  150 - 400 K/uL Final   nRBC 12/09/2021 0.0  0.0 - 0.2 % Final   Neutrophils Relative % 12/09/2021 70  % Final   Neutro Abs 12/09/2021 9.2 (H)  1.7 - 7.7 K/uL Final   Lymphocytes Relative 12/09/2021 14  % Final   Lymphs Abs 12/09/2021 1.8  0.7 - 4.0 K/uL Final   Monocytes Relative 12/09/2021 11  % Final   Monocytes Absolute 12/09/2021 1.5 (H)  0.1 - 1.0 K/uL Final   Eosinophils Relative 12/09/2021 5  % Final   Eosinophils Absolute 12/09/2021 0.7 (H)  0.0 - 0.5 K/uL Final   Basophils Relative 12/09/2021 0  % Final   Basophils Absolute 12/09/2021 0.1  0.0 - 0.1 K/uL Final   Immature Granulocytes 12/09/2021 0  % Final   Abs Immature Granulocytes 12/09/2021 0.04  0.00 - 0.07 K/uL Final   Performed at [facility], [place] [address], [place], [place] [address]   Sodium 12/09/2021 136  135 - 145 mmol/L Final   Potassium 12/09/2021 4.1  3.5 - 5.1 mmol/L Final   Chloride 12/09/2021 102  98 - 111 mmol/L Final   CO2 12/09/2021 27  22 - 32 mmol/L Final   Glucose, Bld 12/09/2021 112 (H)  70 - 99  mg/dL Final   Glucose reference range applies only to samples taken after  fasting for at least 8 hours.   BUN 12/09/2021 16  6 - 20 mg/dL Final   Creatinine, Ser 12/09/2021 0.96  0.61 - 1.24 mg/dL Final   Calcium 12/09/2021 8.8 (L)  8.9 - 10.3 mg/dL Final   GFR, Estimated 12/09/2021 >60  >60 mL/min Final   Comment: (NOTE) Calculated using the CKD-EPI Creatinine Equation (2021)    Anion gap 12/09/2021 7  5 - 15 Final   Performed at [facility], [place] [address], [place], [place] [address]   Specimen Description 12/09/2021    Final                   Value:URINE, CLEAN CATCH Performed at [facility], [place] [address] [place], [place] [address]    Special Requests 12/09/2021    Final                   Value:NONE Performed at [facility], [place] [address] [place], [place] [address]    Culture 12/09/2021    Final                   Value:NO GROWTH Performed at [facility], [place] [address] [place], [place] [address]    Report Status 12/09/2021 12/11/2021 FINAL   Final   SARS Coronavirus 2 by RT PCR 12/10/2021 NEGATIVE  NEGATIVE Final   Comment: (NOTE) SARS-CoV-2 target nucleic acids are NOT DETECTED.  The SARS-CoV-2 RNA is generally detectable in upper respiratory specimens during the acute phase of infection. The lowest concentration of SARS-CoV-2 viral copies this assay can detect is 138 copies/mL. A negative result does not preclude SARS-Cov-2 infection and should not be used as the sole basis for treatment or other patient management decisions. A negative result may occur with  improper specimen collection/handling, submission of specimen other than nasopharyngeal swab, presence of viral mutation(s) within the areas targeted by this assay, and inadequate number of viral copies(<138 copies/mL). A negative result must be combined with clinical observations, patient history, and epidemiological information. The expected result is Negative.  Fact Sheet for Patients:   EntrepreneurPulse.com.au  Fact Sheet for Healthcare Providers:  IncredibleEmployment.be  This test is no                          t yet approved or cleared by the [place] FDA and  has been authorized for detection and/or diagnosis of SARS-CoV-2 by FDA under an Emergency Use Authorization (EUA). This EUA will remain  in effect (meaning this test can be used) for the duration of the COVID-19 declaration under Section 564(b)(1) of the Act, 21 U.S.C.section 360bbb-3(b)(1), unless the authorization is terminated  or revoked sooner.       Influenza A by PCR 12/10/2021 NEGATIVE  NEGATIVE Final   Influenza B by PCR 12/10/2021 NEGATIVE  NEGATIVE Final   Comment: (NOTE) The Xpert Xpress SARS-CoV-2/FLU/RSV plus assay is intended as an aid in the diagnosis of influenza from Nasopharyngeal swab specimens and should not be used as a sole basis for treatment. Nasal washings and aspirates are unacceptable for Xpert Xpress SARS-CoV-2/FLU/RSV testing.  Fact Sheet for Patients: EntrepreneurPulse.com.au  Fact Sheet for Healthcare Providers: IncredibleEmployment.be  This test is not yet approved or cleared by the [place] FDA and has been authorized for detection and/or diagnosis of SARS-CoV-2 by FDA under an  Emergency Use Authorization (EUA). This EUA will remain in effect (meaning this test can be used) for the duration of the COVID-19 declaration under Section 564(b)(1) of the Act, 21 U.S.C. section 360bbb-3(b)(1), unless the authorization is terminated or revoked.  Performed at [facility], [place] [address], [place], [place] [address]    Troponin I (High Sensitivity) 12/10/2021 12  <18 ng/L Final   Comment: (NOTE) Elevated high sensitivity troponin I (hsTnI) values and significant  changes across serial measurements may suggest ACS but many other  chronic and acute conditions are known to  elevate hsTnI results.  Refer to the "Links" section for chest pain algorithms and additional  guidance. Performed at [facility], [place] [address] [place], [place] [address]    Specimen Description 12/10/2021    Final                   Value:BLOOD BLOOD RIGHT HAND Performed at [facility], [place] [address] [place], [place] [address]    Special Requests 12/10/2021    Final                   Value:BOTTLES DRAWN AEROBIC AND ANAEROBIC Blood Culture adequate volume Performed at [place] [address] [place], [place] [address]    Culture 12/10/2021    Final                   Value:NO GROWTH 5 DAYS Performed at [place] [address] [place], [place] [address]    Report Status 12/10/2021 12/15/2021 FINAL   Final   Specimen Description 12/10/2021    Final                   Value:BLOOD BLOOD RIGHT ARM Performed at [facility], [place] [address], [place], [place] [address]    Special Requests 12/10/2021    Final                   Value:BOTTLES DRAWN AEROBIC AND ANAEROBIC Blood Culture adequate volume Performed at [place] [address], [place], [place] [address]    Culture 12/10/2021    Final                   Value:NO GROWTH 5 DAYS Performed at [place] [address] [place], [place] [address]    Report Status 12/10/2021 12/15/2021 FINAL   Final   HIV-1 P24 Antigen - HIV24 12/10/2021 NON REACTIVE  NON REACTIVE Final   Comment: (NOTE) Detection of p24 may be inhibited by biotin in the sample, causing false negative results in acute infection.    HIV 1/2 Antibodies 12/10/2021 NON REACTIVE  NON REACTIVE Final   Interpretation (HIV Ag Ab) 12/10/2021 A non reactive test result means that HIV 1 or HIV 2 antibodies and HIV 1 p24 antigen were not detected in the specimen.   Final   Comment: RESULT CALLED TO, READ BACK BY AND VERIFIED WITH: BRATU,D. EMTP AT 1237  12/10/21 MULLINS,T Performed at [facility], [place] [address], [place], [place] [address]   Admission on 11/26/2021, Discharged on 11/26/2021  Component Date Value Ref Range Status   Sodium 11/26/2021 137  135 - 145 mmol/L Final   Potassium 11/26/2021 3.4 (L)  3.5 - 5.1 mmol/L Final   Chloride 11/26/2021 104  98 - 111 mmol/L Final   CO2 11/26/2021 25  22 -  32 mmol/L Final   Glucose, Bld 11/26/2021 114 (H)  70 - 99 mg/dL Final   Glucose reference range applies only to samples taken after fasting for at least 8 hours.   BUN 11/26/2021 14  6 - 20 mg/dL Final   Creatinine, Ser 11/26/2021 1.36 (H)  0.61 - 1.24 mg/dL Final   Calcium 11/26/2021 9.4  8.9 - 10.3 mg/dL Final   GFR, Estimated 11/26/2021 >60  >60 mL/min Final   Comment: (NOTE) Calculated using the CKD-EPI Creatinine Equation (2021)    Anion gap 11/26/2021 8  5 - 15 Final   Performed at [facility], [place] [address], [place], [place] [address]   WBC 11/26/2021 7.9  4.0 - 10.5 K/uL Final   RBC 11/26/2021 4.37  4.22 - 5.81 MIL/uL Final   Hemoglobin 11/26/2021 13.1  13.0 - 17.0 g/dL Final   HCT 11/26/2021 38.8 (L)  39.0 - 52.0 % Final   MCV 11/26/2021 88.8  80.0 - 100.0 fL Final   MCH 11/26/2021 30.0  26.0 - 34.0 pg Final   MCHC 11/26/2021 33.8  30.0 - 36.0 g/dL Final   RDW 11/26/2021 13.0  11.5 - 15.5 % Final   Platelets 11/26/2021 273  150 - 400 K/uL Final   nRBC 11/26/2021 0.0  0.0 - 0.2 % Final   Performed at [facility], [place] [address], [place], [place] [address]   Troponin I (High Sensitivity) 11/26/2021 2  <18 ng/L Final   Comment: (NOTE) Elevated high sensitivity troponin I (hsTnI) values and significant  changes across serial measurements may suggest ACS but many other  chronic and acute conditions are known to elevate hsTnI results.  Refer to the "Links" section for chest pain algorithms and additional  guidance. Performed at [facility], [place] [address] [place], [place] [address]    Troponin I (High Sensitivity) 11/26/2021 2  <18 ng/L Final   Comment: (NOTE) Elevated high sensitivity troponin I (hsTnI) values and significant  changes across serial measurements may suggest ACS but many other  chronic and acute conditions are known to elevate hsTnI results.  Refer to the "Links" section for chest pain algorithms and additional  guidance. Performed at [facility], [place] [address], [place], [place] [address]    Total CK 11/26/2021 412 (H)  49 - 397 U/L Final   Performed at [facility], [place] [address], [place], [place] [address]    Allergies: Fish allergy and Shellfish allergy  PTA Medications: (Not in a hospital admission)   Long Term Goals: Improvement in symptoms so as ready for discharge  Short Term Goals: Patient will verbalize feelings in meetings with treatment team members., Patient will attend at least of 50% of the groups daily., Pt will complete the PHQ9 on admission, day 3 and discharge., Patient will participate in completing the [place] q shift., Patient will remain free of seclusion/restraint/assault incidents 24 hours prior to discharge., and Patient will take medications as prescribed daily.  Medical Decision Making  Patient is a 36 year old male with past psychiatric history of bipolar disorder, depression, polysubstance abuse (meth, cannabis, opioids, alcohol), homelessness, and malingering admitted to [facility] for substance abuse treatment.  Patient was seen yesterday and was observed overnight at [facility]31/[facility]22.  -DDX -Bipolar disorder -Recent opioid use -Alcohol use disorder -Cannabis use disorder -Methamphetamine abuse disorder  Labs reviewed  Cbc-hemoglobin 12.2, HCT 38.1, WBC within normal limits CMP -  WNL except AST 61, ALT 48.  Will send hepatitis panel HbA1c -Will order, 5.8 last year Lipid Panel -Will  order UDS- + for amphetamine, methamphetamine and THC TSH-Will order Respiratory panel -Influenza A and B negative, Covid Negative Ethanol less than 10 Salicylate level <7  Acetaminophen level <10  EKG- Qtc 436, Sinus tachycardia, Right atrial enlargement, borderline right axis deviation Troponin 3 Safety and Monitoring --  Admission to [facility] for stabilization and treatment -- Daily contact with patient to assess and evaluate symptoms and progress in treatment -- Patient's case to be discussed in multi-disciplinary team meeting. -- Patient will be encouraged to participate in the therapeutic group milieu. -- Vital signs:  q12 hours  Plan  -Monitor Vitals. -Monitor for Suicidal Ideation. -Monitor for withdrawal symptoms. -Monitor for medication side effects.  -Bipolar disorder -Recent opioid use -Alcohol use disorder -Cannabis use disorder -Methamphetamine abuse disorder  -Continue Wellbutrin XL 300 mg daily for depression. -Continue gabapentin 300 mg 3 times daily for anxiety -Continue Seroquel 200 mg nightly for mood stabilization and bipolar depression -CSW to help with substance abuse program placement.  -Start COWS, CIWA with as needed medications for withdrawal symptoms -Start thiamine and multivitamin.  Management of Medical illnesses -Continue home Lipitor 80 mg nightly  PRN's  -Continue Tylenol 650 mg every 6 hours as needed for pain or fever -Continue Milk of Magnesia 30 ml PRN Daily for Constipation. -Continue Maalox/Mylanta 30 ml Q4H PRN for Indigestion. -Continue Hydroxyzine 25 mg TID PRN for Anxiety. -Continue Trazodone 50 mg QHS PRN for sleep.   Recommendations  Based on my evaluation the patient does not appear to have an emergency medical condition.  Armando Reichert, MD PGY 2 03/31/22  12:39 PM
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
## 2022-03-31 NOTE — ED Notes (Signed)
Patient admitted to [facility] from [facility] with the intention of going to rehab in order to detox from etoh and methamphetamine.  Patient is restless with mild irritability and rapid speech and thought.  Patient states " I just need to get out of [place] and I will be fine."  Limited insight and judgement with poor comprehension of addiction/recovery.  Patient was cooperative with the admission process, was shown to his room and he took a shower.  Patient then given a cup of coffee at his request.  He is now sitting in the dayroom watching TV and waiting for lunch.  Patient denied avh shi or plan at this time.  He stated that he had been feeling suicidal earlier but now that he is here he is less so.  Patient encouraged to seek out staff if overwhelmed by thoughts or feelings that overwhelm him.  Will monitor and provide a safe supportive environment for him.

## 2022-04-01 DIAGNOSIS — R45851 Suicidal ideations: Secondary | ICD-10-CM | POA: Diagnosis not present

## 2022-04-01 DIAGNOSIS — F319 Bipolar disorder, unspecified: Secondary | ICD-10-CM | POA: Diagnosis not present

## 2022-04-01 DIAGNOSIS — F1910 Other psychoactive substance abuse, uncomplicated: Secondary | ICD-10-CM | POA: Diagnosis not present

## 2022-04-01 DIAGNOSIS — Z20822 Contact with and (suspected) exposure to covid-19: Secondary | ICD-10-CM | POA: Diagnosis not present

## 2022-04-01 NOTE — ED Notes (Signed)
Patient A+Ox4. Denies SI, HI, and AVH. Patient compliant with medications.patient denies s/s of withdrawal. Now in bed appears asleep . No acute distress noted.

## 2022-04-11 ENCOUNTER — Other Ambulatory Visit (HOSPITAL_COMMUNITY)
Admission: EM | Admit: 2022-04-11 | Discharge: 2022-04-14 | Disposition: A | Discharge status: Rehabilitation Facility | Payer: BLUE CROSS/BLUE SHIELD | Attending: Psychiatry | Admitting: Psychiatry

## 2022-04-11 DIAGNOSIS — Z20822 Contact with and (suspected) exposure to covid-19: Secondary | ICD-10-CM | POA: Insufficient documentation

## 2022-04-11 DIAGNOSIS — F319 Bipolar disorder, unspecified: Secondary | ICD-10-CM | POA: Insufficient documentation

## 2022-04-11 DIAGNOSIS — F17210 Nicotine dependence, cigarettes, uncomplicated: Secondary | ICD-10-CM | POA: Diagnosis not present

## 2022-04-11 DIAGNOSIS — F1994 Other psychoactive substance use, unspecified with psychoactive substance-induced mood disorder: Secondary | ICD-10-CM | POA: Insufficient documentation

## 2022-04-11 DIAGNOSIS — F1510 Other stimulant abuse, uncomplicated: Secondary | ICD-10-CM | POA: Diagnosis not present

## 2022-04-11 LAB — CBC WITH DIFFERENTIAL/PLATELET
Abs Immature Granulocytes: 0.07 10*3/uL (ref 0.00–0.07)
Basophils Absolute: 0 10*3/uL (ref 0.0–0.1)
Basophils Relative: 0 %
Eosinophils Absolute: 0.3 10*3/uL (ref 0.0–0.5)
Eosinophils Relative: 2 %
HCT: 40 % (ref 39.0–52.0)
Hemoglobin: 13 g/dL (ref 13.0–17.0)
Immature Granulocytes: 1 %
Lymphocytes Relative: 18 %
Lymphs Abs: 2.5 10*3/uL (ref 0.7–4.0)
MCH: 29.7 pg (ref 26.0–34.0)
MCHC: 32.5 g/dL (ref 30.0–36.0)
MCV: 91.5 fL (ref 80.0–100.0)
Monocytes Absolute: 1.1 10*3/uL — ABNORMAL HIGH (ref 0.1–1.0)
Monocytes Relative: 8 %
Neutro Abs: 9.9 10*3/uL — ABNORMAL HIGH (ref 1.7–7.7)
Neutrophils Relative %: 71 %
Platelets: 322 10*3/uL (ref 150–400)
RBC: 4.37 MIL/uL (ref 4.22–5.81)
RDW: 15.1 % (ref 11.5–15.5)
WBC: 13.9 10*3/uL — ABNORMAL HIGH (ref 4.0–10.5)
nRBC: 0 % (ref 0.0–0.2)

## 2022-04-11 LAB — COMPREHENSIVE METABOLIC PANEL
ALT: 47 U/L — ABNORMAL HIGH (ref 0–44)
AST: 33 U/L (ref 15–41)
Albumin: 3.9 g/dL (ref 3.5–5.0)
Alkaline Phosphatase: 69 U/L (ref 38–126)
Anion gap: 10 (ref 5–15)
BUN: 12 mg/dL (ref 6–20)
CO2: 24 mmol/L (ref 22–32)
Calcium: 9.2 mg/dL (ref 8.9–10.3)
Chloride: 104 mmol/L (ref 98–111)
Creatinine, Ser: 1.17 mg/dL (ref 0.61–1.24)
GFR, Estimated: >60 mL/min (ref >60)
Glucose, Bld: 80 mg/dL (ref 70–99)
Potassium: 4.3 mmol/L (ref 3.5–5.1)
Sodium: 138 mmol/L (ref 135–145)
Total Bilirubin: 0.5 mg/dL (ref 0.3–1.2)
Total Protein: 6.9 g/dL (ref 6.5–8.1)

## 2022-04-11 LAB — POCT URINE DRUG SCREEN - MANUAL ENTRY (I-SCREEN)
POC Amphetamine UR: NOT DETECTED
POC Buprenorphine (BUP): NOT DETECTED
POC Cocaine UR: NOT DETECTED
POC Marijuana UR: NOT DETECTED
POC Methadone UR: NOT DETECTED
POC Methamphetamine UR: NOT DETECTED
POC Morphine: NOT DETECTED
POC Oxazepam (BZO): NOT DETECTED
POC Oxycodone UR: NOT DETECTED
POC Secobarbital (BAR): NOT DETECTED

## 2022-04-11 LAB — RESP PANEL BY RT-PCR (FLU A&B, COVID) ARPGX2
Influenza A by PCR: NEGATIVE
Influenza B by PCR: NEGATIVE
SARS Coronavirus 2 by RT PCR: NEGATIVE

## 2022-04-11 MED ORDER — MAGNESIUM HYDROXIDE 400 MG/5ML PO SUSP: 30.0000 mL | Freq: Every day | ORAL | Status: DC | PRN

## 2022-04-11 MED ORDER — ISOSORBIDE MONONITRATE ER 30 MG PO TB24
15.0000 mg | ORAL_TABLET | Freq: Every day | ORAL | Status: DC
  Administered 2022-04-11 – 2022-04-14 (×4): 15 mg via ORAL
  Filled 2022-04-11: qty 7
  Filled 2022-04-11 (×4): qty 1

## 2022-04-11 MED ORDER — BUPROPION HCL ER (XL) 300 MG PO TB24
300.0000 mg | ORAL_TABLET | Freq: Every day | ORAL | Status: DC
  Administered 2022-04-11 – 2022-04-14 (×4): 300 mg via ORAL
  Filled 2022-04-11 (×2): qty 1
  Filled 2022-04-11: qty 14
  Filled 2022-04-11 (×2): qty 1

## 2022-04-11 MED ORDER — ATORVASTATIN CALCIUM 10 MG PO TABS
80.0000 mg | ORAL_TABLET | Freq: Every day | ORAL | Status: DC
  Administered 2022-04-11 – 2022-04-13 (×3): 80 mg via ORAL
  Filled 2022-04-11: qty 2
  Filled 2022-04-11: qty 28
  Filled 2022-04-11: qty 2
  Filled 2022-04-11: qty 8

## 2022-04-11 MED ORDER — ACETAMINOPHEN 325 MG PO TABS: 650.0000 mg | ORAL_TABLET | Freq: Four times a day (QID) | ORAL | Status: DC | PRN

## 2022-04-11 MED ORDER — GABAPENTIN 300 MG PO CAPS
300.0000 mg | ORAL_CAPSULE | Freq: Three times a day (TID) | ORAL | Status: DC
  Administered 2022-04-11 – 2022-04-14 (×8): 300 mg via ORAL
  Filled 2022-04-11: qty 42
  Filled 2022-04-11 (×8): qty 1

## 2022-04-11 MED ORDER — ALUM & MAG HYDROXIDE-SIMETH 200-200-20 MG/5ML PO SUSP: 30.0000 mL | ORAL | Status: DC | PRN

## 2022-04-11 MED ORDER — HYDROXYZINE HCL 25 MG PO TABS
25.0000 mg | ORAL_TABLET | Freq: Three times a day (TID) | ORAL | Status: DC | PRN
  Filled 2022-04-11: qty 10

## 2022-04-11 MED ORDER — TRAZODONE HCL 50 MG PO TABS: 50.0000 mg | ORAL_TABLET | Freq: Every evening | ORAL | Status: DC | PRN

## 2022-04-11 MED ORDER — QUETIAPINE FUMARATE 200 MG PO TABS
200.0000 mg | ORAL_TABLET | Freq: Every day | ORAL | Status: DC
  Administered 2022-04-11 – 2022-04-13 (×3): 200 mg via ORAL
  Filled 2022-04-11 (×3): qty 1
  Filled 2022-04-11: qty 14

## 2022-04-11 NOTE — ED Provider Notes (Signed)
[facility] Continuous Assessment Admission H&P  Date: 04/11/22 Patient Name: Brian [name] MRN: [id number] Chief Complaint:  Chief Complaint  Patient presents with   Addiction Problem      Diagnoses:  Final diagnoses:  Methamphetamine abuse ([place])  Substance induced mood disorder (HCC)    HPI: Brian [name] is a 36 year old male with psychiatric history of polysubstance abuse, depression, and suicidal ideation.  Patient presented voluntarily to [facility] for a walk-in assessment.   Patient was seen face-to-face and his chart was reviewed by this nurse practitioner.  On assessment, he is alert and oriented x4.  Patient is calm and cooperative.  Patient's speech is clear and coherent.  He is able to maintain good eye contact.  Patient's mood is anxious with congruent affect.  His thought process is coherent.  Patient did not appear to be responding to any internal/external stimuli or experiencing any delusional thought content during assessment.  Patient was discharged from this [facility] on 03/31/22 to [facility] for residential substance abuse treatment. Patient states he was sent from [facility] to [facility] in [place], [place] for stabilization because he was experiencing paranoia and other withdrawal symptoms. He says he was discharged back to [facility] today to continue his residential treatment however upon his arrival to [facility], he was informed that they do not admit patients on Friday afternoon. He says that [facility] informed him his bed will be available on Monday 04/14/2022. Patient reports that he came to [facility] due to fear of relapsing while awaiting bed availability. He denies any substance use since his most recent admission here at [facility] on 03/30/22. He denies suicidal ideation, homicidal ideation, paranoia, and hallucination.   PHQ 2-9:  [place] ED from 03/30/2022 in [facility] ED from 05/02/2020 in [place] DEPT   Thoughts that you would be better off dead, or of hurting yourself in some way Not at all More than half the days  PHQ-9 Total Score 5 12       [place] ED from 03/30/2022 in [facility] ED from 03/29/2022 in [place] DEPT ED from 03/25/2022 in [place] DEPT  C-SSRS RISK CATEGORY No Risk High Risk No Risk        Total Time spent with patient: 15 minutes  Musculoskeletal  Strength & Muscle Tone: within normal limits Gait & Station: normal Patient leans: Right  Psychiatric Specialty Exam  Presentation General Appearance: Appropriate for Environment  Eye Contact:Good  Speech:Clear and Coherent  Speech Volume:Normal  Handedness:Right   Mood and Affect  Mood:Depressed  Affect:Congruent   Thought Process  Thought Processes:Coherent  Descriptions of Associations:Intact  Orientation:Full (Time, Place and Person)  Thought Content:WDL  Diagnosis of Schizophrenia or Schizoaffective disorder in past: No   Hallucinations:Hallucinations: None  Ideas of Reference:None  Suicidal Thoughts:Suicidal Thoughts: No  Homicidal Thoughts:Homicidal Thoughts: No   Sensorium  Memory:Immediate Good; Recent Good; Remote Good  Judgment:Good  Insight:Good   Executive Functions  Concentration:Good  Attention Span:Good  [place] of Knowledge:Good  Language:Good   Psychomotor Activity  Psychomotor Activity:Psychomotor Activity: Normal   Assets  Assets:Communication Skills; Desire for Improvement; Physical Health   Sleep  Sleep:No data recorded  Nutritional Assessment (For OBS and FBC admissions only) Has the patient had a weight loss or gain of 10 pounds or more in the last 3 months?: No Has the patient had a decrease in food intake/or appetite?: No Does the patient have dental  problems?: No Does the patient have eating habits or behaviors that may be  indicators of an eating disorder including binging or inducing vomiting?: No Has the patient recently lost weight without trying?: 0 Has the patient been eating poorly because of a decreased appetite?: 0 Malnutrition Screening Tool Score: 0    Physical Exam Vitals and nursing note reviewed.  Constitutional:      General: He is not in acute distress.    Appearance: He is well-developed. He is not ill-appearing.  HENT:     Head: Normocephalic and atraumatic.  Eyes:     Conjunctiva/sclera: Conjunctivae normal.  Cardiovascular:     Rate and Rhythm: Normal rate.  Pulmonary:     Effort: Pulmonary effort is normal. No respiratory distress.     Breath sounds: Normal breath sounds.  Abdominal:     Palpations: Abdomen is soft.     Tenderness: There is no abdominal tenderness.  Musculoskeletal:        General: No swelling.     Cervical back: Neck supple.  Skin:    General: Skin is warm and dry.  Neurological:     Mental Status: He is alert and oriented to person, place, and time.  Psychiatric:        Attention and Perception: Attention and perception normal. He does not perceive auditory or visual hallucinations.        Mood and Affect: Mood is depressed.        Speech: Speech normal.        Behavior: Behavior normal. Behavior is cooperative.        Thought Content: Thought content normal.        Cognition and Memory: Cognition normal.    Review of Systems  Constitutional: Negative.   HENT: Negative.    Eyes: Negative.   Respiratory: Negative.    Cardiovascular: Negative.   Gastrointestinal: Negative.   Genitourinary: Negative.   Musculoskeletal: Negative.   Skin: Negative.   Neurological: Negative.   Endo/Heme/Allergies: Negative.   Psychiatric/Behavioral:  The patient is nervous/anxious.     Blood pressure 131/82, pulse 100, temperature 98.3 F (36.8 C), resp. rate 20, SpO2 100 %. There is no height or weight on file to calculate BMI.  Past Psychiatric History:  Polysubstance abuse, substance-induced mood disorder, anxiety, and depression  Is the patient at risk to self? No  Has the patient been a risk to self in the past 6 months? Yes .    Has the patient been a risk to self within the distant past? Yes   Is the patient a risk to others? No   Has the patient been a risk to others in the past 6 months? No   Has the patient been a risk to others within the distant past? No   Past Medical History:  Past Medical History:  Diagnosis Date   Alcohol abuse    Anxiety    Bipolar 2 disorder ([place])    Cocaine abuse ([place])    Depression    Kidney stones    Methamphetamine abuse, episodic ([place])    Myocardial infarction ([place])    Narcotic abuse ([place])    Peptic ulcer    Polysubstance abuse ([place])    Renal disorder    kidney stones    Past Surgical History:  Procedure Laterality Date   ESOPHAGOGASTRODUODENOSCOPY N/A 05/29/2014   Procedure: ESOPHAGOGASTRODUODENOSCOPY (EGD) ;  Surgeon: Beryle Beams, MD;  Location: [facility] ENDOSCOPY;  Service: Endoscopy;  Laterality: N/A;  check with Dr.  Mann about sedation type/timinng - I recommend MAC   HAND SURGERY  2006   LEFT HEART CATH AND CORONARY ANGIOGRAPHY N/A 06/21/2020   Procedure: LEFT HEART CATH AND CORONARY ANGIOGRAPHY;  Surgeon: Nelva Bush, MD;  Location: [place] CV LAB;  Service: Cardiovascular;  Laterality: N/A;    Family History:  Family History  Problem Relation Age of Onset   Heart disease Father     Social History:  Social History   Socioeconomic History   Marital status: Single    Spouse name: Not on file   Number of children: Not on file   Years of education: Not on file   Highest education level: Not on file  Occupational History   Not on file  Tobacco Use   Smoking status: Every Day    Packs/day: 0.50    Types: Cigarettes   Smokeless tobacco: Never  Vaping Use   Vaping Use: Never used  Substance and Sexual Activity   Alcohol use: Not Currently   Drug use: Yes    Types:  Marijuana, Cocaine, IV, Methamphetamines    Comment: "meth yesterday morning" per pt   Sexual activity: Not Currently  Other Topics Concern   Not on file  Social History Narrative   ** Merged History Encounter **       Lives with a friend   Lost house and custody of son 9broke up with fiancee)   Nature conservation officer   Social Determinants of Radio broadcast assistant Strain: Not on file  Food Insecurity: Not on file  Transportation Needs: Not on file  Physical Activity: Not on file  Stress: Not on file  Social Connections: Not on file  Intimate Partner Violence: Not on file    SDOH:  SDOH Screenings   Alcohol Screen: Not on file  Depression (PHQ2-9): Medium Risk (03/31/2022)   Depression (PHQ2-9)    PHQ-2 Score: 5  Financial Resource Strain: Not on file  Food Insecurity: Not on file  Housing: High Risk (08/09/2020)   Housing    Last Housing Risk Score: 2  Physical Activity: Not on file  Social Connections: Not on file  Stress: Not on file  Tobacco Use: High Risk (03/31/2022)   Patient History    Smoking Tobacco Use: Every Day    Smokeless Tobacco Use: Never    Passive Exposure: Not on file  Transportation Needs: Not on file    Last Labs:  Admission on 03/30/2022, Discharged on 04/01/2022  Component Date Value Ref Range Status   SARS Coronavirus 2 by RT PCR 03/30/2022 NEGATIVE  NEGATIVE Final   Comment: (NOTE) SARS-CoV-2 target nucleic acids are NOT DETECTED.  The SARS-CoV-2 RNA is generally detectable in upper respiratory specimens during the acute phase of infection. The lowest concentration of SARS-CoV-2 viral copies this assay can detect is 138 copies/mL. A negative result does not preclude SARS-Cov-2 infection and should not be used as the sole basis for treatment or other patient management decisions. A negative result may occur with  improper specimen collection/handling, submission of specimen other than nasopharyngeal swab, presence of viral mutation(s)  within the areas targeted by this assay, and inadequate number of viral copies(<138 copies/mL). A negative result must be combined with clinical observations, patient history, and epidemiological information. The expected result is Negative.  Fact Sheet for Patients:  EntrepreneurPulse.com.au  Fact Sheet for Healthcare Providers:  IncredibleEmployment.be  This test is no  t yet approved or cleared by the [place] and  has been authorized for detection and/or diagnosis of SARS-CoV-2 by FDA under an Emergency Use Authorization (EUA). This EUA will remain  in effect (meaning this test can be used) for the duration of the COVID-19 declaration under Section 564(b)(1) of the Act, 21 U.S.C.section 360bbb-3(b)(1), unless the authorization is terminated  or revoked sooner.       Influenza A by PCR 03/30/2022 NEGATIVE  NEGATIVE Final   Influenza B by PCR 03/30/2022 NEGATIVE  NEGATIVE Final   Comment: (NOTE) The Xpert Xpress SARS-CoV-2/FLU/RSV plus assay is intended as an aid in the diagnosis of influenza from Nasopharyngeal swab specimens and should not be used as a sole basis for treatment. Nasal washings and aspirates are unacceptable for Xpert Xpress SARS-CoV-2/FLU/RSV testing.  Fact Sheet for Patients: EntrepreneurPulse.com.au  Fact Sheet for Healthcare Providers: IncredibleEmployment.be  This test is not yet approved or cleared by the [place] FDA and has been authorized for detection and/or diagnosis of SARS-CoV-2 by FDA under an Emergency Use Authorization (EUA). This EUA will remain in effect (meaning this test can be used) for the duration of the COVID-19 declaration under Section 564(b)(1) of the Act, 21 U.S.C. section 360bbb-3(b)(1), unless the authorization is terminated or revoked.  Performed at [facility], [place] [address], [place], [place] [address]     POC Amphetamine UR 03/30/2022 Positive (A)  NONE DETECTED (Cut Off Level 1000 ng/mL) Final   POC Secobarbital (BAR) 03/30/2022 None Detected  NONE DETECTED (Cut Off Level 300 ng/mL) Final   POC Buprenorphine (BUP) 03/30/2022 None Detected  NONE DETECTED (Cut Off Level 10 ng/mL) Final   POC Oxazepam (BZO) 03/30/2022 None Detected  NONE DETECTED (Cut Off Level 300 ng/mL) Final   POC Cocaine UR 03/30/2022 None Detected  NONE DETECTED (Cut Off Level 300 ng/mL) Final   POC Methamphetamine UR 03/30/2022 Positive (A)  NONE DETECTED (Cut Off Level 1000 ng/mL) Final   POC Morphine 03/30/2022 None Detected  NONE DETECTED (Cut Off Level 300 ng/mL) Final   POC Methadone UR 03/30/2022 None Detected  NONE DETECTED (Cut Off Level 300 ng/mL) Final   POC Oxycodone UR 03/30/2022 None Detected  NONE DETECTED (Cut Off Level 100 ng/mL) Final   POC Marijuana UR 03/30/2022 Positive (A)  NONE DETECTED (Cut Off Level 50 ng/mL) Final   SARSCOV2ONAVIRUS 2 AG 03/30/2022 NEGATIVE  NEGATIVE Final   Comment: (NOTE) SARS-CoV-2 antigen NOT DETECTED.   Negative results are presumptive.  Negative results do not preclude SARS-CoV-2 infection and should not be used as the sole basis for treatment or other patient management decisions, including infection  control decisions, particularly in the presence of clinical signs and  symptoms consistent with COVID-19, or in those who have been in contact with the virus.  Negative results must be combined with clinical observations, patient history, and epidemiological information. The expected result is Negative.  Fact Sheet for Patients: HandmadeRecipes.com.cy  Fact Sheet for Healthcare Providers: FuneralLife.at  This test is not yet approved or cleared by the [place] FDA and  has been authorized for detection and/or diagnosis of SARS-CoV-2 by FDA under an Emergency Use Authorization (EUA).  This EUA will remain in effect  (meaning this test can be used) for the duration of  the COV                          ID-19 declaration under Section 564(b)(1) of the  Act, 21 U.S.C. section 360bbb-3(b)(1), unless the authorization is terminated or revoked sooner.    Admission on 03/29/2022, Discharged on 03/30/2022  Component Date Value Ref Range Status   Sodium 03/29/2022 142  135 - 145 mmol/L Final   Potassium 03/29/2022 3.5  3.5 - 5.1 mmol/L Final   Chloride 03/29/2022 110  98 - 111 mmol/L Final   CO2 03/29/2022 24  22 - 32 mmol/L Final   Glucose, Bld 03/29/2022 108 (H)  70 - 99 mg/dL Final   Glucose reference range applies only to samples taken after fasting for at least 8 hours.   BUN 03/29/2022 13  6 - 20 mg/dL Final   Creatinine, Ser 03/29/2022 1.19  0.61 - 1.24 mg/dL Final   Calcium 03/29/2022 9.2  8.9 - 10.3 mg/dL Final   Total Protein 03/29/2022 7.7  6.5 - 8.1 g/dL Final   Albumin 03/29/2022 4.2  3.5 - 5.0 g/dL Final   AST 03/29/2022 61 (H)  15 - 41 U/L Final   ALT 03/29/2022 48 (H)  0 - 44 U/L Final   Alkaline Phosphatase 03/29/2022 64  38 - 126 U/L Final   Total Bilirubin 03/29/2022 0.9  0.3 - 1.2 mg/dL Final   GFR, Estimated 03/29/2022 >60  >60 mL/min Final   Comment: (NOTE) Calculated using the CKD-EPI Creatinine Equation (2021)    Anion gap 03/29/2022 8  5 - 15 Final   Performed at  [facility], [place] [address] [place], [place] [address]   Alcohol, Ethyl (B) 03/29/2022 <10  <10 mg/dL Final   Comment: (NOTE) Lowest detectable limit for serum alcohol is 10 mg/dL.  For medical purposes only. Performed at [facility], [place] [address] [place], [place] [address]    Salicylate Lvl 70/26/3785 <7.0 (L)  7.0 - 30.0 mg/dL Final   Performed at [place] [address] [place], [place] [address]   Acetaminophen (Tylenol), Serum 03/29/2022 <10 (L)  10 - 30 ug/mL Final   Comment: (NOTE) Therapeutic concentrations vary significantly. A range of  10-30 ug/mL  may be an effective concentration for many patients. However, some  are best treated at concentrations outside of this range. Acetaminophen concentrations >150 ug/mL at 4 hours after ingestion  and >50 ug/mL at 12 hours after ingestion are often associated with  toxic reactions.  Performed at [facility], [place] [address], [place], [place] [address]    WBC 03/29/2022 7.5  4.0 - 10.5 K/uL Final   RBC 03/29/2022 4.21 (L)  4.22 - 5.81 MIL/uL Final   Hemoglobin 03/29/2022 12.2 (L)  13.0 - 17.0 g/dL Final   HCT 03/29/2022 38.1 (L)  39.0 - 52.0 % Final   MCV 03/29/2022 90.5  80.0 - 100.0 fL Final   MCH 03/29/2022 29.0  26.0 - 34.0 pg Final   MCHC 03/29/2022 32.0  30.0 - 36.0 g/dL Final   RDW 03/29/2022 15.3  11.5 - 15.5 % Final   Platelets 03/29/2022 228  150 - 400 K/uL Final   nRBC 03/29/2022 0.0  0.0 - 0.2 % Final   Performed at [facility], [place] [address], [place], [place] [address]   Opiates 03/29/2022 NONE DETECTED  NONE DETECTED Final   Cocaine 03/29/2022 NONE DETECTED  NONE DETECTED Final   Benzodiazepines 03/29/2022 NONE DETECTED  NONE DETECTED Final   Amphetamines 03/29/2022 POSITIVE (A)  NONE DETECTED Final   Tetrahydrocannabinol 03/29/2022 POSITIVE (A)  NONE DETECTED Final   Barbiturates 03/29/2022 NONE DETECTED  NONE DETECTED Final   Comment: (NOTE) DRUG SCREEN FOR MEDICAL PURPOSES ONLY.  IF CONFIRMATION IS NEEDED FOR ANY PURPOSE, NOTIFY LAB WITHIN 5 DAYS.  LOWEST DETECTABLE LIMITS FOR URINE DRUG SCREEN Drug Class                     Cutoff (ng/mL) Amphetamine and metabolites    1000 Barbiturate and metabolites    200 Benzodiazepine                 542 Tricyclics and metabolites     300 Opiates and metabolites        300 Cocaine and metabolites        300 THC                            50 Performed at [facility], [place] [address] [place], [place] [address]    Troponin I (High Sensitivity)  03/29/2022 3  <18 ng/L Final   Comment: (NOTE) Elevated high sensitivity troponin I (hsTnI) values and significant  changes across serial measurements may suggest ACS but many other  chronic and acute conditions are known to elevate hsTnI results.  Refer to the "Links" section for chest pain algorithms and additional  guidance. Performed at [facility], [place] [address] [place], [place] [address]    SARS Coronavirus 2 by RT PCR 03/29/2022 NEGATIVE  NEGATIVE Final   Comment: (NOTE) SARS-CoV-2 target nucleic acids are NOT DETECTED.  The SARS-CoV-2 RNA is generally detectable in upper and lower respiratory specimens during the acute phase of infection. The lowest concentration of SARS-CoV-2 viral copies this assay can detect is 250 copies / mL. A negative result does not preclude SARS-CoV-2 infection and should not be used as the sole basis for treatment or other patient management decisions.  A negative result may occur with improper specimen collection / handling, submission of specimen other than nasopharyngeal swab, presence of viral mutation(s) within the areas targeted by this assay, and inadequate number of viral copies (<250 copies / mL). A negative result must be combined with clinical observations, patient history, and epidemiological information.  Fact Sheet for Patients:   https://www.patel.info/  Fact Sheet for Healthcare Providers: https://hall.com/  This test is not yet approved or                           cleared by the [place] FDA and has been authorized for detection and/or diagnosis of SARS-CoV-2 by FDA under an Emergency Use Authorization (EUA).  This EUA will remain in effect (meaning this test can be used) for the duration of the COVID-19 declaration under Section 564(b)(1) of the Act, 21 U.S.C. section 360bbb-3(b)(1), unless the authorization is terminated or revoked sooner.  Performed at  [facility], [place] [address] [place], [place] [address]    SARS Coronavirus 2 by RT PCR 03/30/2022 NEGATIVE  NEGATIVE Final   Comment: (NOTE) SARS-CoV-2 target nucleic acids are NOT DETECTED.  The SARS-CoV-2 RNA is generally detectable in upper respiratory specimens during the acute phase of infection. The lowest concentration of SARS-CoV-2 viral copies this assay can detect is 138 copies/mL. A negative result does not preclude SARS-Cov-2 infection and should not be used as the sole basis for treatment or other patient management decisions. A negative result may occur with  improper specimen collection/handling, submission of specimen other than nasopharyngeal swab, presence  of viral mutation(s) within the areas targeted by this assay, and inadequate number of viral copies(<138 copies/mL). A negative result must be combined with clinical observations, patient history, and epidemiological information. The expected result is Negative.  Fact Sheet for Patients:  EntrepreneurPulse.com.au  Fact Sheet for Healthcare Providers:  IncredibleEmployment.be  This test is no                          t yet approved or cleared by the [place] FDA and  has been authorized for detection and/or diagnosis of SARS-CoV-2 by FDA under an Emergency Use Authorization (EUA). This EUA will remain  in effect (meaning this test can be used) for the duration of the COVID-19 declaration under Section 564(b)(1) of the Act, 21 U.S.C.section 360bbb-3(b)(1), unless the authorization is terminated  or revoked sooner.       Influenza A by PCR 03/30/2022 NEGATIVE  NEGATIVE Final   Influenza B by PCR 03/30/2022 NEGATIVE  NEGATIVE Final   Comment: (NOTE) The Xpert Xpress SARS-CoV-2/FLU/RSV plus assay is intended as an aid in the diagnosis of influenza from Nasopharyngeal swab specimens and should not be used as a sole basis for treatment. Nasal washings  and aspirates are unacceptable for Xpert Xpress SARS-CoV-2/FLU/RSV testing.  Fact Sheet for Patients: EntrepreneurPulse.com.au  Fact Sheet for Healthcare Providers: IncredibleEmployment.be  This test is not yet approved or cleared by the [place] FDA and has been authorized for detection and/or diagnosis of SARS-CoV-2 by FDA under an Emergency Use Authorization (EUA). This EUA will remain in effect (meaning this test can be used) for the duration of the COVID-19 declaration under Section 564(b)(1) of the Act, 21 U.S.C. section 360bbb-3(b)(1), unless the authorization is terminated or revoked.  Performed at [facility], [place] [address], [place], [place] [address]   Admission on 12/09/2021, Discharged on 12/10/2021  Component Date Value Ref Range Status   Color, Urine 12/09/2021 YELLOW  YELLOW Final   APPearance 12/09/2021 CLEAR  CLEAR Final   Specific Gravity, Urine 12/09/2021 1.010  1.005 - 1.030 Final   pH 12/09/2021 6.0  5.0 - 8.0 Final   Glucose, UA 12/09/2021 NEGATIVE  NEGATIVE mg/dL Final   Hgb urine dipstick 12/09/2021 NEGATIVE  NEGATIVE Final   Bilirubin Urine 12/09/2021 NEGATIVE  NEGATIVE Final   Ketones, ur 12/09/2021 NEGATIVE  NEGATIVE mg/dL Final   Protein, ur 12/09/2021 NEGATIVE  NEGATIVE mg/dL Final   Nitrite 12/09/2021 NEGATIVE  NEGATIVE Final   Leukocytes,Ua 12/09/2021 NEGATIVE  NEGATIVE Final   Performed at [place] [address] [place], [place] [address]   WBC 12/09/2021 13.2 (H)  4.0 - 10.5 K/uL Final   RBC 12/09/2021 3.96 (L)  4.22 - 5.81 MIL/uL Final   Hemoglobin 12/09/2021 11.5 (L)  13.0 - 17.0 g/dL Final   HCT 12/09/2021 35.4 (L)  39.0 - 52.0 % Final   MCV 12/09/2021 89.4  80.0 - 100.0 fL Final   MCH 12/09/2021 29.0  26.0 - 34.0 pg Final   MCHC 12/09/2021 32.5  30.0 - 36.0 g/dL Final   RDW 12/09/2021 13.2  11.5 - 15.5 % Final   Platelets 12/09/2021 294  150 - 400 K/uL  Final   nRBC 12/09/2021 0.0  0.0 - 0.2 % Final   Neutrophils Relative % 12/09/2021 70  % Final   Neutro Abs 12/09/2021 9.2 (H)  1.7 - 7.7 K/uL Final   Lymphocytes Relative 12/09/2021 14  % Final   Lymphs  Abs 12/09/2021 1.8  0.7 - 4.0 K/uL Final   Monocytes Relative 12/09/2021 11  % Final   Monocytes Absolute 12/09/2021 1.5 (H)  0.1 - 1.0 K/uL Final   Eosinophils Relative 12/09/2021 5  % Final   Eosinophils Absolute 12/09/2021 0.7 (H)  0.0 - 0.5 K/uL Final   Basophils Relative 12/09/2021 0  % Final   Basophils Absolute 12/09/2021 0.1  0.0 - 0.1 K/uL Final   Immature Granulocytes 12/09/2021 0  % Final   Abs Immature Granulocytes 12/09/2021 0.04  0.00 - 0.07 K/uL Final   Performed at [facility], [place] [address], [place], [place] [address]   Sodium 12/09/2021 136  135 - 145 mmol/L Final   Potassium 12/09/2021 4.1  3.5 - 5.1 mmol/L Final   Chloride 12/09/2021 102  98 - 111 mmol/L Final   CO2 12/09/2021 27  22 - 32 mmol/L Final   Glucose, Bld 12/09/2021 112 (H)  70 - 99 mg/dL Final   Glucose reference range applies only to samples taken after fasting for at least 8 hours.   BUN 12/09/2021 16  6 - 20 mg/dL Final   Creatinine, Ser 12/09/2021 0.96  0.61 - 1.24 mg/dL Final   Calcium 12/09/2021 8.8 (L)  8.9 - 10.3 mg/dL Final   GFR, Estimated 12/09/2021 >60  >60 mL/min Final   Comment: (NOTE) Calculated using the CKD-EPI Creatinine Equation (2021)    Anion gap 12/09/2021 7  5 - 15 Final   Performed at [facility], [place] [address], [place], [place] [address]   Specimen Description 12/09/2021    Final                   Value:URINE, CLEAN CATCH Performed at [facility], [place] [address] [place], [place] [address]    Special Requests 12/09/2021    Final                   Value:NONE Performed at [facility], [place] [address], [place], [place] [address]    Culture 12/09/2021    Final                   Value:NO  GROWTH Performed at [facility], [place] [address] [place], [place] [address]    Report Status 12/09/2021 12/11/2021 FINAL   Final   SARS Coronavirus 2 by RT PCR 12/10/2021 NEGATIVE  NEGATIVE Final   Comment: (NOTE) SARS-CoV-2 target nucleic acids are NOT DETECTED.  The SARS-CoV-2 RNA is generally detectable in upper respiratory specimens during the acute phase of infection. The lowest concentration of SARS-CoV-2 viral copies this assay can detect is 138 copies/mL. A negative result does not preclude SARS-Cov-2 infection and should not be used as the sole basis for treatment or other patient management decisions. A negative result may occur with  improper specimen collection/handling, submission of specimen other than nasopharyngeal swab, presence of viral mutation(s) within the areas targeted by this assay, and inadequate number of viral copies(<138 copies/mL). A negative result must be combined with clinical observations, patient history, and epidemiological information. The expected result is Negative.  Fact Sheet for Patients:  EntrepreneurPulse.com.au  Fact Sheet for Healthcare Providers:  IncredibleEmployment.be  This test is no                          t yet approved or cleared by the [place] and  has been authorized  for detection and/or diagnosis of SARS-CoV-2 by FDA under an Emergency Use Authorization (EUA). This EUA will remain  in effect (meaning this test can be used) for the duration of the COVID-19 declaration under Section 564(b)(1) of the Act, 21 U.S.C.section 360bbb-3(b)(1), unless the authorization is terminated  or revoked sooner.       Influenza A by PCR 12/10/2021 NEGATIVE  NEGATIVE Final   Influenza B by PCR 12/10/2021 NEGATIVE  NEGATIVE Final   Comment: (NOTE) The Xpert Xpress SARS-CoV-2/FLU/RSV plus assay is intended as an aid in the diagnosis of influenza from Nasopharyngeal swab specimens  and should not be used as a sole basis for treatment. Nasal washings and aspirates are unacceptable for Xpert Xpress SARS-CoV-2/FLU/RSV testing.  Fact Sheet for Patients: EntrepreneurPulse.com.au  Fact Sheet for Healthcare Providers: IncredibleEmployment.be  This test is not yet approved or cleared by the [place] FDA and has been authorized for detection and/or diagnosis of SARS-CoV-2 by FDA under an Emergency Use Authorization (EUA). This EUA will remain in effect (meaning this test can be used) for the duration of the COVID-19 declaration under Section 564(b)(1) of the Act, 21 U.S.C. section 360bbb-3(b)(1), unless the authorization is terminated or revoked.  Performed at [facility], [place] [address] [place], [place] [address]    Troponin I (High Sensitivity) 12/10/2021 12  <18 ng/L Final   Comment: (NOTE) Elevated high sensitivity troponin I (hsTnI) values and significant  changes across serial measurements may suggest ACS but many other  chronic and acute conditions are known to elevate hsTnI results.  Refer to the "Links" section for chest pain algorithms and additional  guidance. Performed at [facility], [place] [address], [place], [place] [address]    Specimen Description 12/10/2021    Final                   Value:BLOOD BLOOD RIGHT HAND Performed at [facility], [place] [address], [place], [place] [address]    Special Requests 12/10/2021    Final                   Value:BOTTLES DRAWN AEROBIC AND ANAEROBIC Blood Culture adequate volume Performed at [place] [address], [place], [place] [address]    Culture 12/10/2021    Final                   Value:NO GROWTH 5 DAYS Performed at [place] [address], [place], [place] [address]    Report Status 12/10/2021 12/15/2021 FINAL   Final   Specimen Description 12/10/2021    Final                    Value:BLOOD BLOOD RIGHT ARM Performed at [facility], [place] [address], [place], [place] [address]    Special Requests 12/10/2021    Final                   Value:BOTTLES DRAWN AEROBIC AND ANAEROBIC Blood Culture adequate volume Performed at [place] [address], [place], [place] [address]    Culture 12/10/2021    Final                   Value:NO GROWTH 5 DAYS Performed at [place] [address] [place], [place] [address]    Report Status 12/10/2021 12/15/2021 FINAL   Final   HIV-1 P24 Antigen -  HIV24 12/10/2021 NON REACTIVE  NON REACTIVE Final   Comment: (NOTE) Detection of p24 may be inhibited by biotin in the sample, causing false negative results in acute infection.    HIV 1/2 Antibodies 12/10/2021 NON REACTIVE  NON REACTIVE Final   Interpretation (HIV Ag Ab) 12/10/2021 A non reactive test result means that HIV 1 or HIV 2 antibodies and HIV 1 p24 antigen were not detected in the specimen.   Final   Comment: RESULT CALLED TO, READ BACK BY AND VERIFIED WITH: BRATU,D. EMTP AT 1237 12/10/21 MULLINS,T Performed at [facility], [place] [address], [place], [place] [address]   Admission on 11/26/2021, Discharged on 11/26/2021  Component Date Value Ref Range Status   Sodium 11/26/2021 137  135 - 145 mmol/L Final   Potassium 11/26/2021 3.4 (L)  3.5 - 5.1 mmol/L Final   Chloride 11/26/2021 104  98 - 111 mmol/L Final   CO2 11/26/2021 25  22 - 32 mmol/L Final   Glucose, Bld 11/26/2021 114 (H)  70 - 99 mg/dL Final   Glucose reference range applies only to samples taken after fasting for at least 8 hours.   BUN 11/26/2021 14  6 - 20 mg/dL Final   Creatinine, Ser 11/26/2021 1.36 (H)  0.61 - 1.24 mg/dL Final   Calcium 11/26/2021 9.4  8.9 - 10.3 mg/dL Final   GFR, Estimated 11/26/2021 >60  >60 mL/min Final   Comment: (NOTE) Calculated using the CKD-EPI Creatinine Equation (2021)    Anion gap 11/26/2021 8  5 -  15 Final   Performed at [facility], [place] [address], [place], [place] [address]   WBC 11/26/2021 7.9  4.0 - 10.5 K/uL Final   RBC 11/26/2021 4.37  4.22 - 5.81 MIL/uL Final   Hemoglobin 11/26/2021 13.1  13.0 - 17.0 g/dL Final   HCT 11/26/2021 38.8 (L)  39.0 - 52.0 % Final   MCV 11/26/2021 88.8  80.0 - 100.0 fL Final   MCH 11/26/2021 30.0  26.0 - 34.0 pg Final   MCHC 11/26/2021 33.8  30.0 - 36.0 g/dL Final   RDW 11/26/2021 13.0  11.5 - 15.5 % Final   Platelets 11/26/2021 273  150 - 400 K/uL Final   nRBC 11/26/2021 0.0  0.0 - 0.2 % Final   Performed at [facility], [place] [address], [place], [place] [address]   Troponin I (High Sensitivity) 11/26/2021 2  <18 ng/L Final   Comment: (NOTE) Elevated high sensitivity troponin I (hsTnI) values and significant  changes across serial measurements may suggest ACS but many other  chronic and acute conditions are known to elevate hsTnI results.  Refer to the "Links" section for chest pain algorithms and additional  guidance. Performed at [facility], [place] [address] [place], [place] [address]    Troponin I (High Sensitivity) 11/26/2021 2  <18 ng/L Final   Comment: (NOTE) Elevated high sensitivity troponin I (hsTnI) values and significant  changes across serial measurements may suggest ACS but many other  chronic and acute conditions are known to elevate hsTnI results.  Refer to the "Links" section for chest pain algorithms and additional  guidance. Performed at [facility], [place] [address] [place], [place] [address]    Total CK 11/26/2021 412 (H)  49 - 397 U/L Final   Performed at [facility], [place] [address] [place], [place] [address]    Allergies: Fish allergy and Shellfish allergy  PTA Medications: (Not in  a hospital admission)   Medical Decision Making  Patient will be admitted to [facility] for continuous assessment Lab Orders          Resp Panel by RT-PCR (Flu A&B, Covid) Anterior Nasal Swab         CBC with Differential/Platelet         Comprehensive metabolic panel         POCT Urine Drug Screen - (I-Screen)    -Resume home medications     Recommendations  Based on my evaluation the patient does not appear to have an emergency medical condition.  Ophelia Shoulder, NP 04/11/22  9:00 PM

## 2022-04-11 NOTE — Progress Notes (Signed)
   04/11/22 1940  [place] Triage Screening (Walk-ins at [facility] only)  How Did You Hear About [place]? Other (Comment) ([facility])  What Is the Reason for Your Visit/Call Today? Pt has diagnosis of major depressive disorder and history of substance use. He was in [facility] continuous assessment on 03/31/2022 and admitted to [facility] 04/01/2022. He states he was there a few days but was exhibiting paranoia and was sent to [facility] in [place]. He says from there he has sent to [facility] in [place]. He says today he was returned to [facility] to resume residential treatment. He states staff there said his bed is reserved but there was a miscommunication and they do not admit patients on Friday afternoon. He says bed will not be available until Monday. Pt says he brought here because there were no other options. Pt denies any substance use since 03/31/2022. He says he has had suicidal ideation recently but denies current suicidal ideation. He denies homicidal ideation. He denies psychotic symptoms.  How Long Has This Been Causing You Problems? > than 6 months  Have You Recently Had Any Thoughts About Hurting Yourself? No  Are You Planning to Commit Suicide/Harm Yourself At This time? No  Have you Recently Had Thoughts About [place]? No  Are You Planning To Harm Someone At This Time? No  Are you currently experiencing any auditory, visual or other hallucinations? No  Have You Used Any Alcohol or Drugs in the Past 24 Hours? No  How long ago did you use Drugs or Alcohol? 03/31/2022  Do you have any current medical co-morbidities that require immediate attention? No  Clinician description of patient physical appearance/behavior: Pt is casually dressed, alert and oriented x4. Pt speaks in a clear tone, at moderate volume and normal pace. Motor behavior appears normal. Eye contact is good. Pt's mood is euthymic and affect is congruent with mood. Thought process is coherent and relevant. There is no  indication Pt is currently responding to internal stimuli or experiencing delusional thought content. He is pleasant and cooperative.  What Do You Feel Would Help You the Most Today? Alcohol or Drug Use Treatment;Treatment for Depression or other mood problem  If access to [facility] was not available, would you have sought care in the Emergency Department? Yes  Determination of Need Routine (7 days)  Options For Referral [facility];Medication Management;Outpatient Therapy

## 2022-04-12 DIAGNOSIS — F1994 Other psychoactive substance use, unspecified with psychoactive substance-induced mood disorder: Secondary | ICD-10-CM | POA: Diagnosis not present

## 2022-04-12 DIAGNOSIS — F1510 Other stimulant abuse, uncomplicated: Secondary | ICD-10-CM

## 2022-04-12 NOTE — ED Notes (Signed)
Pt  admitted to [facility] due to being unable to contract for safety if discharged from facility. Pt states, "I'm afraid if I don't stay, I won't be able to stay clean until Monday when I go to [facility]". Calm, cooperative throughout interview process. Skin assessment completed. Oriented to unit. Meal and drink offered. Will monitor for safety.

## 2022-04-12 NOTE — ED Notes (Signed)
Pt sitting in dayroom watching tv. Denies needs or concerns at present. Informed pt to notify staff with any concerns. Will continue to monitor for safety.

## 2022-04-12 NOTE — ED Notes (Signed)
Pt sitting in dining room watching TV. A&O x4, calm and cooperative. Denies SI/HI/AVH. No signs of acute distress noted. Will continue to monitor for safety.  

## 2022-04-12 NOTE — ED Provider Notes (Signed)
Facility Based Crisis Admission H&P  Date: 04/12/22 Patient Name: Brian [name] MRN: [id number] Chief Complaint:  Chief Complaint  Patient presents with   Addiction Problem      Diagnoses:  Final diagnoses:  Methamphetamine abuse ([place])  Substance induced mood disorder ([place])    HPI: Brian [name] 36 y.o., male patient presented to [facility] as a walk-in on 04/11/2022 and admitted to the continuous assessment unit.  He was reevaluated and will be transferred to the [facility] unit while awaiting bed availability at [place], 36 y.o., male patient seen face to face by this provider, consulted with Dr. Serafina Mitchell; and chart reviewed on 04/12/22.  Per chart review patient has a past psychiatric history of bipolar disorder, polysubstance abuse (methamphetamines, cannabis, alcohol, and opioids), depression and suicidal ideations.  He denies any history of alcohol withdrawal seizures or delirium tremors. UDS on admission is neg. Bal not completed.   Of note: Patient was discharged from the [facility] on 03/31/2022 to [facility] for residential substance treatment.  Reports he was not stable enough at that time for residential and [facility] sent him to the [facility] in [place] for stabilization.  Reports he was having paranoia and auditory hallucinations.  He was discharged back to [facility] on 04/11/2022 but upon arrival [facility] was closed for admissions.  Patient states he was told by Sharyn Lull at [facility] that he has an available bed for Monday 04/14/2022.  Patient presents to [facility] due to fear of relapsing throughout the weekend.  Reports he does not think he can keep himself safe from using substances and states, "I do not know what will happen to me".   On today's evaluation Brian [name] is laying in his bed awake.  He is alert/oriented x4, cooperative, attentive and pleasant. He reports some mild depression related to his situation and appears slightly anxious. He denies any  concerns with appetite or sleep.He denies SI/HI/AVH. Objectively there is no evidence of psychosis/mania or delusional thinking.  Patient is able to converse coherently, goal directed thoughts, no distractibility, or pre-occupation.     Attempted to contact [facility] and no answer.  Discussed with patient that [facility] is unable to be reached due to it being a weekend and his bed cannot be verified.  Patient continues to request discharge for Monday.  Plan will be to discharge and transfer to [facility] residential treatment center in [facility] on Monday 04/14/2022.   PHQ 2-9:  [place] ED from 04/11/2022 in [facility] ED from 03/30/2022 in [facility] ED from 05/02/2020 in [place] DEPT  Thoughts that you would be better off dead, or of hurting yourself in some way Not at all Not at all More than half the days  PHQ-9 Total Score _0 Flowsheet Row ED from 04/11/2022 in [facility] ED from 03/30/2022 in [facility] ED from 03/29/2022 in [place] DEPT  C-SSRS RISK CATEGORY Error: Q3, 4, or 5 should not be populated when Q2 is No No Risk High Risk        Total Time spent with patient: 30 minutes  Musculoskeletal  Strength & Muscle Tone: within normal limits Gait & Station: normal Patient leans: N/A  Psychiatric Specialty Exam  Presentation General Appearance: Appropriate for Environment; Casual  Eye Contact:Good  Speech:Clear and Coherent; Normal Rate  Speech Volume:Normal  Handedness:Right   Mood and Affect  Mood:Anxious; Depressed  Affect:Congruent   Thought Process  Thought Processes:Coherent  Descriptions of Associations:Intact  Orientation:Full (Time, Place and Person)  Thought Content:WDL  Diagnosis of Schizophrenia or Schizoaffective disorder in past: No    Hallucinations:Hallucinations: None  Ideas of Reference:None  Suicidal Thoughts:Suicidal Thoughts: No  Homicidal Thoughts:Homicidal Thoughts: No   Sensorium  Memory:Immediate Good; Recent Good; Remote Good  Judgment:Good  Insight:Good   Executive Functions  Concentration:Good  Attention Span:Good  [place] of Knowledge:Good  Language:Good   Psychomotor Activity  Psychomotor Activity:Psychomotor Activity: Normal   Assets  Assets:Desire for Improvement; Armed forces logistics/support/administrative officer; Financial Resources/Insurance; Physical Health; Leisure Time; Resilience   Sleep  Sleep:Sleep: Fair   Nutritional Assessment (For OBS and FBC admissions only) Has the patient had a weight loss or gain of 10 pounds or more in the last 3 months?: No Has the patient had a decrease in food intake/or appetite?: No Does the patient have dental problems?: No Does the patient have eating habits or behaviors that may be indicators of an eating disorder including binging or inducing vomiting?: No Has the patient recently lost weight without trying?: 0 Has the patient been eating poorly because of a decreased appetite?: 0 Malnutrition Screening Tool Score: 0    Physical Exam ROS  Blood pressure 93/68, pulse 98, temperature 97.8 F (36.6 C), temperature source Oral, resp. rate 18, SpO2 97 %. There is no height or weight on file to calculate BMI.  Past Psychiatric History: see h&p   Is the patient at risk to self? No  Has the patient been a risk to self in the past 6 months? No .    Has the patient been a risk to self within the distant past? No   Is the patient a risk to others? No   Has the patient been a risk to others in the past 6 months? No   Has the patient been a risk to others within the distant past? No   Past Medical History:  Past Medical History:  Diagnosis Date   Alcohol abuse    Anxiety    Bipolar 2 disorder ([place])    Cocaine abuse ([place])    Depression    Kidney  stones    Methamphetamine abuse, episodic ([place])    Myocardial infarction ([place])    Narcotic abuse ([place])    Peptic ulcer    Polysubstance abuse ([place])    Renal disorder    kidney stones    Past Surgical History:  Procedure Laterality Date   ESOPHAGOGASTRODUODENOSCOPY N/A 05/29/2014   Procedure: ESOPHAGOGASTRODUODENOSCOPY (EGD) ;  Surgeon: Beryle Beams, MD;  Location: [facility] ENDOSCOPY;  Service: Endoscopy;  Laterality: N/A;  check with Dr. Collene Mares about sedation type/timinng - I recommend MAC   HAND SURGERY  2006   LEFT HEART CATH AND CORONARY ANGIOGRAPHY N/A 06/21/2020   Procedure: LEFT HEART CATH AND CORONARY ANGIOGRAPHY;  Surgeon: Nelva Bush, MD;  Location: [facility];  Service: Cardiovascular;  Laterality: N/A;    Family History:  Family History  Problem Relation Age of Onset   Heart disease Father     Social History:  Social History   Socioeconomic History   Marital status: Single    Spouse name: Not on file   Number of children: Not on file   Years of education: Not on file   Highest education level: Not on file  Occupational History   Not on file  Tobacco  Use   Smoking status: Every Day    Packs/day: 0.50    Types: Cigarettes   Smokeless tobacco: Never  Vaping Use   Vaping Use: Never used  Substance and Sexual Activity   Alcohol use: Not Currently   Drug use: Yes    Types: Marijuana, Cocaine, IV, Methamphetamines    Comment: "meth yesterday morning" per pt   Sexual activity: Not Currently  Other Topics Concern   Not on file  Social History Narrative   ** Merged History Encounter **       Lives with a friend   Lost house and custody of son 9broke up with fiancee)   Nature conservation officer   Social Determinants of Health   Financial Resource Strain: Not on file  Food Insecurity: Not on file  Transportation Needs: Not on file  Physical Activity: Not on file  Stress: Not on file  Social Connections: Not on file  Intimate Partner Violence: Not on file     SDOH:  SDOH Screenings   Alcohol Screen: Low Risk  (12/21/2020)   Alcohol Screen    Last Alcohol Screening Score (AUDIT): 0  Depression (PHQ2-9): Low Risk  (04/12/2022)   Depression (PHQ2-9)    PHQ-2 Score: 3  Recent Concern: Depression (PHQ2-9) - Medium Risk (03/31/2022)   Depression (PHQ2-9)    PHQ-2 Score: 5  Financial Resource Strain: Not on file  Food Insecurity: Not on file  Housing: High Risk (08/09/2020)   Housing    Last Housing Risk Score: 2  Physical Activity: Not on file  Social Connections: Not on file  Stress: Not on file  Tobacco Use: High Risk (03/31/2022)   Patient History    Smoking Tobacco Use: Every Day    Smokeless Tobacco Use: Never    Passive Exposure: Not on file  Transportation Needs: Not on file    Last Labs:  Admission on 04/11/2022  Component Date Value Ref Range Status   SARS Coronavirus 2 by RT PCR 04/11/2022 NEGATIVE  NEGATIVE Final   Comment: (NOTE) SARS-CoV-2 target nucleic acids are NOT DETECTED.  The SARS-CoV-2 RNA is generally detectable in upper respiratory specimens during the acute phase of infection. The lowest concentration of SARS-CoV-2 viral copies this assay can detect is 138 copies/mL. A negative result does not preclude SARS-Cov-2 infection and should not be used as the sole basis for treatment or other patient management decisions. A negative result may occur with  improper specimen collection/handling, submission of specimen other than nasopharyngeal swab, presence of viral mutation(s) within the areas targeted by this assay, and inadequate number of viral copies(<138 copies/mL). A negative result must be combined with clinical observations, patient history, and epidemiological information. The expected result is Negative.  Fact Sheet for Patients:  EntrepreneurPulse.com.au  Fact Sheet for Healthcare Providers:  IncredibleEmployment.be  This test is no                          t  yet approved or cleared by the [place] FDA and  has been authorized for detection and/or diagnosis of SARS-CoV-2 by FDA under an Emergency Use Authorization (EUA). This EUA will remain  in effect (meaning this test can be used) for the duration of the COVID-19 declaration under Section 564(b)(1) of the Act, 21 U.S.C.section 360bbb-3(b)(1), unless the authorization is terminated  or revoked sooner.       Influenza A by PCR 04/11/2022 NEGATIVE  NEGATIVE Final   Influenza B by PCR 04/11/2022  NEGATIVE  NEGATIVE Final   Comment: (NOTE) The Xpert Xpress SARS-CoV-2/FLU/RSV plus assay is intended as an aid in the diagnosis of influenza from Nasopharyngeal swab specimens and should not be used as a sole basis for treatment. Nasal washings and aspirates are unacceptable for Xpert Xpress SARS-CoV-2/FLU/RSV testing.  Fact Sheet for Patients: EntrepreneurPulse.com.au  Fact Sheet for Healthcare Providers: IncredibleEmployment.be  This test is not yet approved or cleared by the [place] FDA and has been authorized for detection and/or diagnosis of SARS-CoV-2 by FDA under an Emergency Use Authorization (EUA). This EUA will remain in effect (meaning this test can be used) for the duration of the COVID-19 declaration under Section 564(b)(1) of the Act, 21 U.S.C. section 360bbb-3(b)(1), unless the authorization is terminated or revoked.  Performed at [facility], [place] [address] [place], [place] [address]    WBC 04/11/2022 13.9 (H)  4.0 - 10.5 K/uL Final   RBC 04/11/2022 4.37  4.22 - 5.81 MIL/uL Final   Hemoglobin 04/11/2022 13.0  13.0 - 17.0 g/dL Final   HCT 04/11/2022 40.0  39.0 - 52.0 % Final   MCV 04/11/2022 91.5  80.0 - 100.0 fL Final   MCH 04/11/2022 29.7  26.0 - 34.0 pg Final   MCHC 04/11/2022 32.5  30.0 - 36.0 g/dL Final   RDW 04/11/2022 15.1  11.5 - 15.5 % Final   Platelets 04/11/2022 322  150 - 400 K/uL Final   nRBC 04/11/2022  0.0  0.0 - 0.2 % Final   Neutrophils Relative % 04/11/2022 71  % Final   Neutro Abs 04/11/2022 9.9 (H)  1.7 - 7.7 K/uL Final   Lymphocytes Relative 04/11/2022 18  % Final   Lymphs Abs 04/11/2022 2.5  0.7 - 4.0 K/uL Final   Monocytes Relative 04/11/2022 8  % Final   Monocytes Absolute 04/11/2022 1.1 (H)  0.1 - 1.0 K/uL Final   Eosinophils Relative 04/11/2022 2  % Final   Eosinophils Absolute 04/11/2022 0.3  0.0 - 0.5 K/uL Final   Basophils Relative 04/11/2022 0  % Final   Basophils Absolute 04/11/2022 0.0  0.0 - 0.1 K/uL Final   Immature Granulocytes 04/11/2022 1  % Final   Abs Immature Granulocytes 04/11/2022 0.07  0.00 - 0.07 K/uL Final   Performed at [facility], [place] [address] [place], [place] [address]   Sodium 04/11/2022 138  135 - 145 mmol/L Final   Potassium 04/11/2022 4.3  3.5 - 5.1 mmol/L Final   Chloride 04/11/2022 104  98 - 111 mmol/L Final   CO2 04/11/2022 24  22 - 32 mmol/L Final   Glucose, Bld 04/11/2022 80  70 - 99 mg/dL Final   Glucose reference range applies only to samples taken after fasting for at least 8 hours.   BUN 04/11/2022 12  6 - 20 mg/dL Final   Creatinine, Ser 04/11/2022 1.17  0.61 - 1.24 mg/dL Final   Calcium 04/11/2022 9.2  8.9 - 10.3 mg/dL Final   Total Protein 04/11/2022 6.9  6.5 - 8.1 g/dL Final   Albumin 04/11/2022 3.9  3.5 - 5.0 g/dL Final   AST 04/11/2022 33  15 - 41 U/L Final   ALT 04/11/2022 47 (H)  0 - 44 U/L Final   Alkaline Phosphatase 04/11/2022 69  38 - 126 U/L Final   Total Bilirubin 04/11/2022 0.5  0.3 - 1.2 mg/dL Final   GFR, Estimated 04/11/2022 >60  >60 mL/min Final   Comment: (NOTE) Calculated using the CKD-EPI Creatinine Equation (  2021)    Anion gap 04/11/2022 10  5 - 15 Final   Performed at [facility], [place] [address] [place], [place] [address]   POC Amphetamine UR 04/11/2022 None Detected  NONE DETECTED (Cut Off Level 1000 ng/mL) Final   POC Secobarbital (BAR) 04/11/2022 None Detected  NONE DETECTED (Cut Off  Level 300 ng/mL) Final   POC Buprenorphine (BUP) 04/11/2022 None Detected  NONE DETECTED (Cut Off Level 10 ng/mL) Final   POC Oxazepam (BZO) 04/11/2022 None Detected  NONE DETECTED (Cut Off Level 300 ng/mL) Final   POC Cocaine UR 04/11/2022 None Detected  NONE DETECTED (Cut Off Level 300 ng/mL) Final   POC Methamphetamine UR 04/11/2022 None Detected  NONE DETECTED (Cut Off Level 1000 ng/mL) Final   POC Morphine 04/11/2022 None Detected  NONE DETECTED (Cut Off Level 300 ng/mL) Final   POC Methadone UR 04/11/2022 None Detected  NONE DETECTED (Cut Off Level 300 ng/mL) Final   POC Oxycodone UR 04/11/2022 None Detected  NONE DETECTED (Cut Off Level 100 ng/mL) Final   POC Marijuana UR 04/11/2022 None Detected  NONE DETECTED (Cut Off Level 50 ng/mL) Final  Admission on 03/30/2022, Discharged on 04/01/2022  Component Date Value Ref Range Status   SARS Coronavirus 2 by RT PCR 03/30/2022 NEGATIVE  NEGATIVE Final   Comment: (NOTE) SARS-CoV-2 target nucleic acids are NOT DETECTED.  The SARS-CoV-2 RNA is generally detectable in upper respiratory specimens during the acute phase of infection. The lowest concentration of SARS-CoV-2 viral copies this assay can detect is 138 copies/mL. A negative result does not preclude SARS-Cov-2 infection and should not be used as the sole basis for treatment or other patient management decisions. A negative result may occur with  improper specimen collection/handling, submission of specimen other than nasopharyngeal swab, presence of viral mutation(s) within the areas targeted by this assay, and inadequate number of viral copies(<138 copies/mL). A negative result must be combined with clinical observations, patient history, and epidemiological information. The expected result is Negative.  Fact Sheet for Patients:  EntrepreneurPulse.com.au  Fact Sheet for Healthcare Providers:  IncredibleEmployment.be  This test is no                           t yet approved or cleared by the [place] FDA and  has been authorized for detection and/or diagnosis of SARS-CoV-2 by FDA under an Emergency Use Authorization (EUA). This EUA will remain  in effect (meaning this test can be used) for the duration of the COVID-19 declaration under Section 564(b)(1) of the Act, 21 U.S.C.section 360bbb-3(b)(1), unless the authorization is terminated  or revoked sooner.       Influenza A by PCR 03/30/2022 NEGATIVE  NEGATIVE Final   Influenza B by PCR 03/30/2022 NEGATIVE  NEGATIVE Final   Comment: (NOTE) The Xpert Xpress SARS-CoV-2/FLU/RSV plus assay is intended as an aid in the diagnosis of influenza from Nasopharyngeal swab specimens and should not be used as a sole basis for treatment. Nasal washings and aspirates are unacceptable for Xpert Xpress SARS-CoV-2/FLU/RSV testing.  Fact Sheet for Patients: EntrepreneurPulse.com.au  Fact Sheet for Healthcare Providers: IncredibleEmployment.be  This test is not yet approved or cleared by the [place] FDA and has been authorized for detection and/or diagnosis of SARS-CoV-2 by FDA under an Emergency Use Authorization (EUA). This EUA will remain in effect (meaning this test can be used) for the duration of the COVID-19 declaration under Section 564(b)(1) of the Act,  21 U.S.C. section 360bbb-3(b)(1), unless the authorization is terminated or revoked.  Performed at [facility], [place] [address] [place], [place] [address]    POC Amphetamine UR 03/30/2022 Positive (A)  NONE DETECTED (Cut Off Level 1000 ng/mL) Final   POC Secobarbital (BAR) 03/30/2022 None Detected  NONE DETECTED (Cut Off Level 300 ng/mL) Final   POC Buprenorphine (BUP) 03/30/2022 None Detected  NONE DETECTED (Cut Off Level 10 ng/mL) Final   POC Oxazepam (BZO) 03/30/2022 None Detected  NONE DETECTED (Cut Off Level 300 ng/mL) Final   POC Cocaine UR 03/30/2022 None Detected  NONE  DETECTED (Cut Off Level 300 ng/mL) Final   POC Methamphetamine UR 03/30/2022 Positive (A)  NONE DETECTED (Cut Off Level 1000 ng/mL) Final   POC Morphine 03/30/2022 None Detected  NONE DETECTED (Cut Off Level 300 ng/mL) Final   POC Methadone UR 03/30/2022 None Detected  NONE DETECTED (Cut Off Level 300 ng/mL) Final   POC Oxycodone UR 03/30/2022 None Detected  NONE DETECTED (Cut Off Level 100 ng/mL) Final   POC Marijuana UR 03/30/2022 Positive (A)  NONE DETECTED (Cut Off Level 50 ng/mL) Final   SARSCOV2ONAVIRUS 2 AG 03/30/2022 NEGATIVE  NEGATIVE Final   Comment: (NOTE) SARS-CoV-2 antigen NOT DETECTED.   Negative results are presumptive.  Negative results do not preclude SARS-CoV-2 infection and should not be used as the sole basis for treatment or other patient management decisions, including infection  control decisions, particularly in the presence of clinical signs and  symptoms consistent with COVID-19, or in those who have been in contact with the virus.  Negative results must be combined with clinical observations, patient history, and epidemiological information. The expected result is Negative.  Fact Sheet for Patients: HandmadeRecipes.com.cy  Fact Sheet for Healthcare Providers: FuneralLife.at  This test is not yet approved or cleared by the [place] FDA and  has been authorized for detection and/or diagnosis of SARS-CoV-2 by FDA under an Emergency Use Authorization (EUA).  This EUA will remain in effect (meaning this test can be used) for the duration of  the COV                          ID-19 declaration under Section 564(b)(1) of the Act, 21 U.S.C. section 360bbb-3(b)(1), unless the authorization is terminated or revoked sooner.    Admission on 03/29/2022, Discharged on 03/30/2022  Component Date Value Ref Range Status   Sodium 03/29/2022 142  135 - 145 mmol/L Final   Potassium 03/29/2022 3.5  3.5 - 5.1 mmol/L Final    Chloride 03/29/2022 110  98 - 111 mmol/L Final   CO2 03/29/2022 24  22 - 32 mmol/L Final   Glucose, Bld 03/29/2022 108 (H)  70 - 99 mg/dL Final   Glucose reference range applies only to samples taken after fasting for at least 8 hours.   BUN 03/29/2022 13  6 - 20 mg/dL Final   Creatinine, Ser 03/29/2022 1.19  0.61 - 1.24 mg/dL Final   Calcium 03/29/2022 9.2  8.9 - 10.3 mg/dL Final   Total Protein 03/29/2022 7.7  6.5 - 8.1 g/dL Final   Albumin 03/29/2022 4.2  3.5 - 5.0 g/dL Final   AST 03/29/2022 61 (H)  15 - 41 U/L Final   ALT 03/29/2022 48 (H)  0 - 44 U/L Final   Alkaline Phosphatase 03/29/2022 64  38 - 126 U/L Final   Total Bilirubin 03/29/2022 0.9  0.3 - 1.2 mg/dL Final  GFR, Estimated 03/29/2022 >60  >60 mL/min Final   Comment: (NOTE) Calculated using the CKD-EPI Creatinine Equation (2021)    Anion gap 03/29/2022 8  5 - 15 Final   Performed at [facility], [place] [address], [place], [place] [address]   Alcohol, Ethyl (B) 03/29/2022 <10  <10 mg/dL Final   Comment: (NOTE) Lowest detectable limit for serum alcohol is 10 mg/dL.  For medical purposes only. Performed at [facility], [place] [address], [place], [place] [address]    Salicylate Lvl 09/40/7680 <7.0 (L)  7.0 - 30.0 mg/dL Final   Performed at [place] [address], [place], [place] [address]   Acetaminophen (Tylenol), Serum 03/29/2022 <10 (L)  10 - 30 ug/mL Final   Comment: (NOTE) Therapeutic concentrations vary significantly. A range of 10-30 ug/mL  may be an effective concentration for many patients. However, some  are best treated at concentrations outside of this range. Acetaminophen concentrations >150 ug/mL at 4 hours after ingestion  and >50 ug/mL at 12 hours after ingestion are often associated with  toxic reactions.  Performed at  Hospital, [place] [address] [place], [place] [address]    WBC 03/29/2022 7.5  4.0 - 10.5 K/uL  Final   RBC 03/29/2022 4.21 (L)  4.22 - 5.81 MIL/uL Final   Hemoglobin 03/29/2022 12.2 (L)  13.0 - 17.0 g/dL Final   HCT 03/29/2022 38.1 (L)  39.0 - 52.0 % Final   MCV 03/29/2022 90.5  80.0 - 100.0 fL Final   MCH 03/29/2022 29.0  26.0 - 34.0 pg Final   MCHC 03/29/2022 32.0  30.0 - 36.0 g/dL Final   RDW 03/29/2022 15.3  11.5 - 15.5 % Final   Platelets 03/29/2022 228  150 - 400 K/uL Final   nRBC 03/29/2022 0.0  0.0 - 0.2 % Final   Performed at [facility], [place] [address] [place], [place] [address]   Opiates 03/29/2022 NONE DETECTED  NONE DETECTED Final   Cocaine 03/29/2022 NONE DETECTED  NONE DETECTED Final   Benzodiazepines 03/29/2022 NONE DETECTED  NONE DETECTED Final   Amphetamines 03/29/2022 POSITIVE (A)  NONE DETECTED Final   Tetrahydrocannabinol 03/29/2022 POSITIVE (A)  NONE DETECTED Final   Barbiturates 03/29/2022 NONE DETECTED  NONE DETECTED Final   Comment: (NOTE) DRUG SCREEN FOR MEDICAL PURPOSES ONLY.  IF CONFIRMATION IS NEEDED FOR ANY PURPOSE, NOTIFY LAB WITHIN 5 DAYS.  LOWEST DETECTABLE LIMITS FOR URINE DRUG SCREEN Drug Class                     Cutoff (ng/mL) Amphetamine and metabolites    1000 Barbiturate and metabolites    200 Benzodiazepine                 924 Tricyclics and metabolites     300 Opiates and metabolites        300 Cocaine and metabolites        300 THC                            50 Performed at [facility], [place] [address], [place], [place] [address]    Troponin I (High Sensitivity) 03/29/2022 3  <18 ng/L Final   Comment: (NOTE) Elevated high sensitivity troponin I (hsTnI) values and significant  changes across serial measurements may suggest ACS but many other  chronic and acute conditions are known to elevate hsTnI  results.  Refer to the "Links" section for chest pain algorithms and additional  guidance. Performed at [facility], [place] [address], [place], [place] [address]    SARS  Coronavirus 2 by RT PCR 03/29/2022 NEGATIVE  NEGATIVE Final   Comment: (NOTE) SARS-CoV-2 target nucleic acids are NOT DETECTED.  The SARS-CoV-2 RNA is generally detectable in upper and lower respiratory specimens during the acute phase of infection. The lowest concentration of SARS-CoV-2 viral copies this assay can detect is 250 copies / mL. A negative result does not preclude SARS-CoV-2 infection and should not be used as the sole basis for treatment or other patient management decisions.  A negative result may occur with improper specimen collection / handling, submission of specimen other than nasopharyngeal swab, presence of viral mutation(s) within the areas targeted by this assay, and inadequate number of viral copies (<250 copies / mL). A negative result must be combined with clinical observations, patient history, and epidemiological information.  Fact Sheet for Patients:   https://www.patel.info/  Fact Sheet for Healthcare Providers: https://hall.com/  This test is not yet approved or                           cleared by the [place] FDA and has been authorized for detection and/or diagnosis of SARS-CoV-2 by FDA under an Emergency Use Authorization (EUA).  This EUA will remain in effect (meaning this test can be used) for the duration of the COVID-19 declaration under Section 564(b)(1) of the Act, 21 U.S.C. section 360bbb-3(b)(1), unless the authorization is terminated or revoked sooner.  Performed at [facility], [place] [address] [place],  [address]    SARS Coronavirus 2 by RT PCR 03/30/2022 NEGATIVE  NEGATIVE Final   Comment: (NOTE) SARS-CoV-2 target nucleic acids are NOT DETECTED.  The SARS-CoV-2 RNA is generally detectable in upper respiratory specimens during the acute phase of infection. The lowest concentration of SARS-CoV-2 viral copies this assay can detect is 138 copies/mL. A negative  result does not preclude SARS-Cov-2 infection and should not be used as the sole basis for treatment or other patient management decisions. A negative result may occur with  improper specimen collection/handling, submission of specimen other than nasopharyngeal swab, presence of viral mutation(s) within the areas targeted by this assay, and inadequate number of viral copies(<138 copies/mL). A negative result must be combined with clinical observations, patient history, and epidemiological information. The expected result is Negative.  Fact Sheet for Patients:  EntrepreneurPulse.com.au  Fact Sheet for Healthcare Providers:  IncredibleEmployment.be  This test is no                          t yet approved or cleared by the [place] FDA and  has been authorized for detection and/or diagnosis of SARS-CoV-2 by FDA under an Emergency Use Authorization (EUA). This EUA will remain  in effect (meaning this test can be used) for the duration of the COVID-19 declaration under Section 564(b)(1) of the Act, 21 U.S.C.section 360bbb-3(b)(1), unless the authorization is terminated  or revoked sooner.       Influenza A by PCR 03/30/2022 NEGATIVE  NEGATIVE Final   Influenza B by PCR 03/30/2022 NEGATIVE  NEGATIVE Final   Comment: (NOTE) The Xpert Xpress SARS-CoV-2/FLU/RSV plus assay is intended as an aid in the diagnosis of influenza from Nasopharyngeal swab specimens and should not be used as a sole basis for treatment.  Nasal washings and aspirates are unacceptable for Xpert Xpress SARS-CoV-2/FLU/RSV testing.  Fact Sheet for Patients: EntrepreneurPulse.com.au  Fact Sheet for Healthcare Providers: IncredibleEmployment.be  This test is not yet approved or cleared by the [place] FDA and has been authorized for detection and/or diagnosis of SARS-CoV-2 by FDA under an Emergency Use Authorization (EUA). This EUA will  remain in effect (meaning this test can be used) for the duration of the COVID-19 declaration under Section 564(b)(1) of the Act, 21 U.S.C. section 360bbb-3(b)(1), unless the authorization is terminated or revoked.  Performed at [facility], [place] [address] [place], [place] [address]   Admission on 12/09/2021, Discharged on 12/10/2021  Component Date Value Ref Range Status   Color, Urine 12/09/2021 YELLOW  YELLOW Final   APPearance 12/09/2021 CLEAR  CLEAR Final   Specific Gravity, Urine 12/09/2021 1.010  1.005 - 1.030 Final   pH 12/09/2021 6.0  5.0 - 8.0 Final   Glucose, UA 12/09/2021 NEGATIVE  NEGATIVE mg/dL Final   Hgb urine dipstick 12/09/2021 NEGATIVE  NEGATIVE Final   Bilirubin Urine 12/09/2021 NEGATIVE  NEGATIVE Final   Ketones, ur 12/09/2021 NEGATIVE  NEGATIVE mg/dL Final   Protein, ur 12/09/2021 NEGATIVE  NEGATIVE mg/dL Final   Nitrite 12/09/2021 NEGATIVE  NEGATIVE Final   Leukocytes,Ua 12/09/2021 NEGATIVE  NEGATIVE Final   Performed at [place] [address], [place], [place] [address]   WBC 12/09/2021 13.2 (H)  4.0 - 10.5 K/uL Final   RBC 12/09/2021 3.96 (L)  4.22 - 5.81 MIL/uL Final   Hemoglobin 12/09/2021 11.5 (L)  13.0 - 17.0 g/dL Final   HCT 12/09/2021 35.4 (L)  39.0 - 52.0 % Final   MCV 12/09/2021 89.4  80.0 - 100.0 fL Final   MCH 12/09/2021 29.0  26.0 - 34.0 pg Final   MCHC 12/09/2021 32.5  30.0 - 36.0 g/dL Final   RDW 12/09/2021 13.2  11.5 - 15.5 % Final   Platelets 12/09/2021 294  150 - 400 K/uL Final   nRBC 12/09/2021 0.0  0.0 - 0.2 % Final   Neutrophils Relative % 12/09/2021 70  % Final   Neutro Abs 12/09/2021 9.2 (H)  1.7 - 7.7 K/uL Final   Lymphocytes Relative 12/09/2021 14  % Final   Lymphs Abs 12/09/2021 1.8  0.7 - 4.0 K/uL Final   Monocytes Relative 12/09/2021 11  % Final   Monocytes Absolute 12/09/2021 1.5 (H)  0.1 - 1.0 K/uL Final   Eosinophils Relative 12/09/2021 5  % Final   Eosinophils Absolute  12/09/2021 0.7 (H)  0.0 - 0.5 K/uL Final   Basophils Relative 12/09/2021 0  % Final   Basophils Absolute 12/09/2021 0.1  0.0 - 0.1 K/uL Final   Immature Granulocytes 12/09/2021 0  % Final   Abs Immature Granulocytes 12/09/2021 0.04  0.00 - 0.07 K/uL Final   Performed at [facility], [place] [address] [place], [place] [address]   Sodium 12/09/2021 136  135 - 145 mmol/L Final   Potassium 12/09/2021 4.1  3.5 - 5.1 mmol/L Final   Chloride 12/09/2021 102  98 - 111 mmol/L Final   CO2 12/09/2021 27  22 - 32 mmol/L Final   Glucose, Bld 12/09/2021 112 (H)  70 - 99 mg/dL Final   Glucose reference range applies only to samples taken after fasting for at least 8 hours.   BUN 12/09/2021 16  6 - 20 mg/dL Final   Creatinine, Ser 12/09/2021 0.96  0.61 - 1.24 mg/dL Final  Calcium 12/09/2021 8.8 (L)  8.9 - 10.3 mg/dL Final   GFR, Estimated 12/09/2021 >60  >60 mL/min Final   Comment: (NOTE) Calculated using the CKD-EPI Creatinine Equation (2021)    Anion gap 12/09/2021 7  5 - 15 Final   Performed at [facility], [place] [address], [place], [place] [address]   Specimen Description 12/09/2021    Final                   Value:URINE, CLEAN CATCH Performed at [facility], [place] [address] [place], [place] [address]    Special Requests 12/09/2021    Final                   Value:NONE Performed at [facility], [place] [address], [place], [place] [address]    Culture 12/09/2021    Final                   Value:NO GROWTH Performed at [facility], [place] [address] [place],  [address]    Report Status 12/09/2021 12/11/2021 FINAL   Final   SARS Coronavirus 2 by RT PCR 12/10/2021 NEGATIVE  NEGATIVE Final   Comment: (NOTE) SARS-CoV-2 target nucleic acids are NOT DETECTED.  The SARS-CoV-2 RNA is generally detectable in upper respiratory specimens during the acute phase of infection. The lowest concentration of SARS-CoV-2 viral  copies this assay can detect is 138 copies/mL. A negative result does not preclude SARS-Cov-2 infection and should not be used as the sole basis for treatment or other patient management decisions. A negative result may occur with  improper specimen collection/handling, submission of specimen other than nasopharyngeal swab, presence of viral mutation(s) within the areas targeted by this assay, and inadequate number of viral copies(<138 copies/mL). A negative result must be combined with clinical observations, patient history, and epidemiological information. The expected result is Negative.  Fact Sheet for Patients:  EntrepreneurPulse.com.au  Fact Sheet for Healthcare Providers:  IncredibleEmployment.be  This test is no                          t yet approved or cleared by the [place] FDA and  has been authorized for detection and/or diagnosis of SARS-CoV-2 by FDA under an Emergency Use Authorization (EUA). This EUA will remain  in effect (meaning this test can be used) for the duration of the COVID-19 declaration under Section 564(b)(1) of the Act, 21 U.S.C.section 360bbb-3(b)(1), unless the authorization is terminated  or revoked sooner.       Influenza A by PCR 12/10/2021 NEGATIVE  NEGATIVE Final   Influenza B by PCR 12/10/2021 NEGATIVE  NEGATIVE Final   Comment: (NOTE) The Xpert Xpress SARS-CoV-2/FLU/RSV plus assay is intended as an aid in the diagnosis of influenza from Nasopharyngeal swab specimens and should not be used as a sole basis for treatment. Nasal washings and aspirates are unacceptable for Xpert Xpress SARS-CoV-2/FLU/RSV testing.  Fact Sheet for Patients: EntrepreneurPulse.com.au  Fact Sheet for Healthcare Providers: IncredibleEmployment.be  This test is not yet approved or cleared by the [place] FDA and has been authorized for detection and/or diagnosis of SARS-CoV-2 by FDA  under an Emergency Use Authorization (EUA). This EUA will remain in effect (meaning this test can be used) for the duration of the COVID-19 declaration under Section 564(b)(1) of the Act, 21 U.S.C. section 360bbb-3(b)(1), unless the authorization is terminated or revoked.  Performed at [facility]  [place] [address] [place], [place] [address]    Troponin I (High Sensitivity) 12/10/2021 12  <18 ng/L Final   Comment: (NOTE) Elevated high sensitivity troponin I (hsTnI) values and significant  changes across serial measurements may suggest ACS but many other  chronic and acute conditions are known to elevate hsTnI results.  Refer to the "Links" section for chest pain algorithms and additional  guidance. Performed at [facility], [place] [address] [place], [place] [address]    Specimen Description 12/10/2021    Final                   Value:BLOOD BLOOD RIGHT HAND Performed at [facility], [place] [address], [place], [place] [address]    Special Requests 12/10/2021    Final                   Value:BOTTLES DRAWN AEROBIC AND ANAEROBIC Blood Culture adequate volume Performed at [place] [address], [place], [place] [address]    Culture 12/10/2021    Final                   Value:NO GROWTH 5 DAYS Performed at [place] [address] [place], [place] [address]    Report Status 12/10/2021 12/15/2021 FINAL   Final   Specimen Description 12/10/2021    Final                   Value:BLOOD BLOOD RIGHT ARM Performed at [facility], [place] [address], [place], [place] [address]    Special Requests 12/10/2021    Final                   Value:BOTTLES DRAWN AEROBIC AND ANAEROBIC Blood Culture adequate volume Performed at [place] [address] [place], [place] [address]    Culture 12/10/2021    Final                   Value:NO GROWTH 5 DAYS Performed at [place] [address] [place], [place] [address]    Report Status 12/10/2021 12/15/2021 FINAL   Final   HIV-1 P24 Antigen - HIV24 12/10/2021 NON REACTIVE  NON REACTIVE Final   Comment: (NOTE) Detection of p24 may be inhibited by biotin in the sample, causing false negative results in acute infection.    HIV 1/2 Antibodies 12/10/2021 NON REACTIVE  NON REACTIVE Final   Interpretation (HIV Ag Ab) 12/10/2021 A non reactive test result means that HIV 1 or HIV 2 antibodies and HIV 1 p24 antigen were not detected in the specimen.   Final   Comment: RESULT CALLED TO, READ BACK BY AND VERIFIED WITH: BRATU,D. EMTP AT 1237 12/10/21 MULLINS,T Performed at [facility], [place] [address] [place], [place] [address]   Admission on 11/26/2021, Discharged on 11/26/2021  Component Date Value Ref Range Status   Sodium 11/26/2021 137  135 - 145 mmol/L Final   Potassium 11/26/2021 3.4 (L)  3.5 - 5.1 mmol/L Final   Chloride 11/26/2021 104  98 - 111 mmol/L Final   CO2 11/26/2021 25  22 - 32 mmol/L Final   Glucose, Bld 11/26/2021 114 (H)  70 - 99 mg/dL Final   Glucose reference range applies only to samples taken after fasting for at least 8 hours.   BUN 11/26/2021 14  6 - 20 mg/dL Final  Creatinine, Ser 11/26/2021 1.36 (H)  0.61 - 1.24 mg/dL Final   Calcium 11/26/2021 9.4  8.9 - 10.3 mg/dL Final   GFR, Estimated 11/26/2021 >60  >60 mL/min Final   Comment: (NOTE) Calculated using the CKD-EPI Creatinine Equation (2021)    Anion gap 11/26/2021 8  5 - 15 Final   Performed at [facility], [place] [address], [place], [place] [address]   WBC 11/26/2021 7.9  4.0 - 10.5 K/uL Final   RBC 11/26/2021 4.37  4.22 - 5.81 MIL/uL Final   Hemoglobin 11/26/2021 13.1  13.0 - 17.0 g/dL Final   HCT 11/26/2021 38.8 (L)  39.0 - 52.0 % Final   MCV 11/26/2021 88.8  80.0 - 100.0 fL Final   MCH 11/26/2021 30.0  26.0 - 34.0 pg Final   MCHC 11/26/2021 33.8  30.0 - 36.0 g/dL Final   RDW  11/26/2021 13.0  11.5 - 15.5 % Final   Platelets 11/26/2021 273  150 - 400 K/uL Final   nRBC 11/26/2021 0.0  0.0 - 0.2 % Final   Performed at [facility], [place] [address] [place], [place] [address]   Troponin I (High Sensitivity) 11/26/2021 2  <18 ng/L Final   Comment: (NOTE) Elevated high sensitivity troponin I (hsTnI) values and significant  changes across serial measurements may suggest ACS but many other  chronic and acute conditions are known to elevate hsTnI results.  Refer to the "Links" section for chest pain algorithms and additional  guidance. Performed at [facility], [place] [address], [place], [place] [address]    Troponin I (High Sensitivity) 11/26/2021 2  <18 ng/L Final   Comment: (NOTE) Elevated high sensitivity troponin I (hsTnI) values and significant  changes across serial measurements may suggest ACS but many other  chronic and acute conditions are known to elevate hsTnI results.  Refer to the "Links" section for chest pain algorithms and additional  guidance. Performed at [facility], [place] [address], [place], [place] [address]    Total CK 11/26/2021 412 (H)  49 - 397 U/L Final   Performed at [facility], [place] [address], [place], [place] [address]    Allergies: Fish allergy and Shellfish allergy  PTA Medications: (Not in a hospital admission)   Long Term Goals: Improvement in symptoms so as ready for discharge  Short Term Goals: Patient will verbalize feelings in meetings with treatment team members., Patient will attend at least of 50% of the groups daily., Pt will complete the PHQ9 on admission, day 3 and discharge., Patient will participate in completing the [place] q shift., Patient will remain free of seclusion/restraint/assault incidents 24 hours prior to discharge., and Patient will take medications as prescribed daily.  Medical Decision Making   Patient presents to [facility] unable to confirm his safety if he were to be discharged. States he is not sure what would happen to him, he is concerned he cant refrain from substance use. Per patient he has a bed available at [facility] on [facility]14/2022. [facility].      Recommendations  Based on my evaluation the patient does not appear to have an emergency medical condition.  Disposition: Per patient he has talked to [place] and has an available bed at [facility] residential on 04/14/2022.  He is requesting to be discharged at that time.  We will continue to have daily contact with patient to assess and evaluate  symptoms and progress in treatment and Medication management  Revonda Humphrey, NP 04/12/22  3:15 PM

## 2022-04-12 NOTE — ED Provider Notes (Signed)
Behavioral Health Progress Note  Date and Time: 04/12/2022 9:55 AM Name: Brian [name] MRN:  [id number]  Subjective:  Brian [name] 36 y.o., male patient presented to [facility] as a walk-in on 04/11/2022 and admitted to the continuous assessment unit.  He was reevaluated and will be transferred to the [facility] unit while awaiting bed availability at [place], 36 y.o., male patient seen face to face by this provider, consulted with Dr. Serafina Mitchell; and chart reviewed on 04/12/22.  Per chart review patient has a past psychiatric history of bipolar disorder, polysubstance abuse (methamphetamines, cannabis, alcohol, and opioids), depression and suicidal ideations.  He denies any history of alcohol withdrawal seizures or delirium tremors. UDS on admission is neg. Bal not completed.  Of note: Patient was discharged from the [facility] on 03/31/2022 to [facility] for residential substance treatment.  Reports he was not stable enough at that time for residential and [facility] sent him to the [facility] in [place] for stabilization.  Reports he was having paranoia and auditory hallucinations.  He was discharged back to [facility] on 04/11/2022 but upon arrival [facility] was closed for admissions.  Patient states he was told by Brian [name] at [facility] that he has an available bed for Monday 04/14/2022.  Patient presents to [facility] due to fear of relapsing throughout the weekend.  Reports he does not think he can keep himself safe from using substances and states, "I do not know what will happen to me".  On today's evaluation Brian [name] is laying in his bed awake.  He is alert/oriented x4, cooperative, attentive and pleasant. He reports some mild depression related to his situation and appears slightly anxious. He denies any concerns with appetite or sleep.He denies SI/HI/AVH. Objectively there is no evidence of psychosis/mania or delusional thinking.  Patient is able to converse coherently, goal directed  thoughts, no distractibility, or pre-occupation.    Attempted to contact [facility] and no answer.  Discussed with patient that [facility] is unable to be reached due to it being a weekend and his bed cannot be verified.  Patient continues to request discharge for Monday.  Plan will be to discharge and transfer to [facility] residential treatment center in [facility] on Monday 04/14/2022.   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[employer])  Substance induced mood disorder ([place])    Total Time spent with patient: 30 minutes  Past Psychiatric History: See H&P Past Medical History:  Past Medical History:  Diagnosis Date   Alcohol abuse    Anxiety    Bipolar 2 disorder ([place])    Cocaine abuse ([place])    Depression    Kidney stones    Methamphetamine abuse, episodic ([place])    Myocardial infarction ([place])    Narcotic abuse ([place])    Peptic ulcer    Polysubstance abuse ([place])    Renal disorder    kidney stones    Past Surgical History:  Procedure Laterality Date   ESOPHAGOGASTRODUODENOSCOPY N/A 05/29/2014   Procedure: ESOPHAGOGASTRODUODENOSCOPY (EGD) ;  Surgeon: Beryle Beams, MD;  Location: [facility] ENDOSCOPY;  Service: Endoscopy;  Laterality: N/A;  check with Dr. Collene Mares about sedation type/timinng - I recommend MAC   HAND SURGERY  2006   LEFT HEART CATH AND CORONARY ANGIOGRAPHY N/A 06/21/2020   Procedure: LEFT HEART CATH AND CORONARY ANGIOGRAPHY;  Surgeon: Nelva Bush, MD;  Location: [place] [facility];  Service: Cardiovascular;  Laterality: N/A;   Family History:  Family History  Problem Relation Age of Onset   Heart disease Father    Family Psychiatric  History: See H&P Social History:  Social History   Substance and Sexual Activity  Alcohol Use Not Currently     Social History   Substance and Sexual Activity  Drug Use Yes   Types: Marijuana, Cocaine, IV, Methamphetamines   Comment: "meth yesterday morning" per pt    Social History   Socioeconomic  History   Marital status: Single    Spouse name: Not on file   Number of children: Not on file   Years of education: Not on file   Highest education level: Not on file  Occupational History   Not on file  Tobacco Use   Smoking status: Every Day    Packs/day: 0.50    Types: Cigarettes   Smokeless tobacco: Never  Vaping Use   Vaping Use: Never used  Substance and Sexual Activity   Alcohol use: Not Currently   Drug use: Yes    Types: Marijuana, Cocaine, IV, Methamphetamines    Comment: "meth yesterday morning" per pt   Sexual activity: Not Currently  Other Topics Concern   Not on file  Social History Narrative   ** Merged History Encounter **       Lives with a friend   Lost house and custody of son 9broke up with fiancee)   Nature conservation officer   Social Determinants of Radio broadcast assistant Strain: Not on file  Food Insecurity: Not on file  Transportation Needs: Not on file  Physical Activity: Not on file  Stress: Not on file  Social Connections: Not on file   SDOH:  SDOH Screenings   Alcohol Screen: Low Risk  (12/21/2020)   Alcohol Screen    Last Alcohol Screening Score (AUDIT): 0  Depression (PHQ2-9): Medium Risk (03/31/2022)   Depression (PHQ2-9)    PHQ-2 Score: 5  Financial Resource Strain: Not on file  Food Insecurity: Not on file  Housing: High Risk (08/09/2020)   Housing    Last Housing Risk Score: 2  Physical Activity: Not on file  Social Connections: Not on file  Stress: Not on file  Tobacco Use: High Risk (03/31/2022)   Patient History    Smoking Tobacco Use: Every Day    Smokeless Tobacco Use: Never    Passive Exposure: Not on file  Transportation Needs: Not on file   Additional Social History:                         Sleep: Good  Appetite:  Good  Current Medications:  Current Facility-Administered Medications  Medication Dose Route Frequency Provider Last Rate Last Admin   acetaminophen (TYLENOL) tablet 650 mg  650 mg Oral  Q6H PRN Ajibola, Ene A, NP       alum & mag hydroxide-simeth (MAALOX/MYLANTA) 200-200-20 MG/5ML suspension 30 mL  30 mL Oral Q4H PRN Ajibola, Ene A, NP       atorvastatin (LIPITOR) tablet 80 mg  80 mg Oral QHS Ajibola, Ene A, NP   80 mg at 04/11/22 2130   buPROPion (WELLBUTRIN XL) 24 hr tablet 300 mg  300 mg Oral Daily Ajibola, Ene A, NP   300 mg at 04/12/22 0918   gabapentin (NEURONTIN) capsule 300 mg  300 mg Oral TID Ajibola, Ene A, NP   300 mg at 04/12/22 0918   hydrOXYzine (ATARAX) tablet 25 mg  25 mg Oral TID PRN Ajibola, Ene A, NP  isosorbide mononitrate (IMDUR) 24 hr tablet 15 mg  15 mg Oral Daily Ajibola, Ene A, NP   15 mg at 04/12/22 6578   magnesium hydroxide (MILK OF MAGNESIA) suspension 30 mL  30 mL Oral Daily PRN Ajibola, Ene A, NP       QUEtiapine (SEROQUEL) tablet 200 mg  200 mg Oral QHS Ajibola, Ene A, NP   200 mg at 04/11/22 2131   traZODone (DESYREL) tablet 50 mg  50 mg Oral QHS PRN Ajibola, Ene A, NP       Current Outpatient Medications  Medication Sig Dispense Refill   buPROPion (WELLBUTRIN XL) 300 MG 24 hr tablet Take 1 tablet (300 mg total) by mouth daily. 30 tablet 1   gabapentin (NEURONTIN) 300 MG capsule Take 1 capsule (300 mg total) by mouth 3 (three) times daily. 90 capsule 1   isosorbide mononitrate (IMDUR) 30 MG 24 hr tablet Take 0.5 tablets (15 mg total) by mouth daily. 15 tablet 1   LIPITOR 80 MG tablet Take 1 tablet (80 mg total) by mouth at bedtime. 30 tablet 1   QUEtiapine (SEROQUEL) 200 MG tablet Take 1 tablet (200 mg total) by mouth at bedtime. 30 tablet 1   traZODone (DESYREL) 50 MG tablet Take 1 tablet (50 mg total) by mouth at bedtime as needed for sleep. 30 tablet 1    Labs  Lab Results:  Admission on 04/11/2022  Component Date Value Ref Range Status   SARS Coronavirus 2 by RT PCR 04/11/2022 NEGATIVE  NEGATIVE Final   Comment: (NOTE) SARS-CoV-2 target nucleic acids are NOT DETECTED.  The SARS-CoV-2 RNA is generally detectable in upper  respiratory specimens during the acute phase of infection. The lowest concentration of SARS-CoV-2 viral copies this assay can detect is 138 copies/mL. A negative result does not preclude SARS-Cov-2 infection and should not be used as the sole basis for treatment or other patient management decisions. A negative result may occur with  improper specimen collection/handling, submission of specimen other than nasopharyngeal swab, presence of viral mutation(s) within the areas targeted by this assay, and inadequate number of viral copies(<138 copies/mL). A negative result must be combined with clinical observations, patient history, and epidemiological information. The expected result is Negative.  Fact Sheet for Patients:  EntrepreneurPulse.com.au  Fact Sheet for Healthcare Providers:  IncredibleEmployment.be  This test is no                          t yet approved or cleared by the [place] FDA and  has been authorized for detection and/or diagnosis of SARS-CoV-2 by FDA under an Emergency Use Authorization (EUA). This EUA will remain  in effect (meaning this test can be used) for the duration of the COVID-19 declaration under Section 564(b)(1) of the Act, 21 U.S.C.section 360bbb-3(b)(1), unless the authorization is terminated  or revoked sooner.       Influenza A by PCR 04/11/2022 NEGATIVE  NEGATIVE Final   Influenza B by PCR 04/11/2022 NEGATIVE  NEGATIVE Final   Comment: (NOTE) The Xpert Xpress SARS-CoV-2/FLU/RSV plus assay is intended as an aid in the diagnosis of influenza from Nasopharyngeal swab specimens and should not be used as a sole basis for treatment. Nasal washings and aspirates are unacceptable for Xpert Xpress SARS-CoV-2/FLU/RSV testing.  Fact Sheet for Patients: EntrepreneurPulse.com.au  Fact Sheet for Healthcare Providers: IncredibleEmployment.be  This test is not yet approved or  cleared by the [place] and has been authorized for  detection and/or diagnosis of SARS-CoV-2 by FDA under an Emergency Use Authorization (EUA). This EUA will remain in effect (meaning this test can be used) for the duration of the COVID-19 declaration under Section 564(b)(1) of the Act, 21 U.S.C. section 360bbb-3(b)(1), unless the authorization is terminated or revoked.  Performed at [facility], [place] [address], [place], [place] [address]    WBC 04/11/2022 13.9 (H)  4.0 - 10.5 K/uL Final   RBC 04/11/2022 4.37  4.22 - 5.81 MIL/uL Final   Hemoglobin 04/11/2022 13.0  13.0 - 17.0 g/dL Final   HCT 04/11/2022 40.0  39.0 - 52.0 % Final   MCV 04/11/2022 91.5  80.0 - 100.0 fL Final   MCH 04/11/2022 29.7  26.0 - 34.0 pg Final   MCHC 04/11/2022 32.5  30.0 - 36.0 g/dL Final   RDW 04/11/2022 15.1  11.5 - 15.5 % Final   Platelets 04/11/2022 322  150 - 400 K/uL Final   nRBC 04/11/2022 0.0  0.0 - 0.2 % Final   Neutrophils Relative % 04/11/2022 71  % Final   Neutro Abs 04/11/2022 9.9 (H)  1.7 - 7.7 K/uL Final   Lymphocytes Relative 04/11/2022 18  % Final   Lymphs Abs 04/11/2022 2.5  0.7 - 4.0 K/uL Final   Monocytes Relative 04/11/2022 8  % Final   Monocytes Absolute 04/11/2022 1.1 (H)  0.1 - 1.0 K/uL Final   Eosinophils Relative 04/11/2022 2  % Final   Eosinophils Absolute 04/11/2022 0.3  0.0 - 0.5 K/uL Final   Basophils Relative 04/11/2022 0  % Final   Basophils Absolute 04/11/2022 0.0  0.0 - 0.1 K/uL Final   Immature Granulocytes 04/11/2022 1  % Final   Abs Immature Granulocytes 04/11/2022 0.07  0.00 - 0.07 K/uL Final   Performed at [facility], [place] [address], [place], [place] [address]   Sodium 04/11/2022 138  135 - 145 mmol/L Final   Potassium 04/11/2022 4.3  3.5 - 5.1 mmol/L Final   Chloride 04/11/2022 104  98 - 111 mmol/L Final   CO2 04/11/2022 24  22 - 32 mmol/L Final   Glucose, Bld 04/11/2022 80  70 - 99 mg/dL Final   Glucose reference range applies only to  samples taken after fasting for at least 8 hours.   BUN 04/11/2022 12  6 - 20 mg/dL Final   Creatinine, Ser 04/11/2022 1.17  0.61 - 1.24 mg/dL Final   Calcium 04/11/2022 9.2  8.9 - 10.3 mg/dL Final   Total Protein 04/11/2022 6.9  6.5 - 8.1 g/dL Final   Albumin 04/11/2022 3.9  3.5 - 5.0 g/dL Final   AST 04/11/2022 33  15 - 41 U/L Final   ALT 04/11/2022 47 (H)  0 - 44 U/L Final   Alkaline Phosphatase 04/11/2022 69  38 - 126 U/L Final   Total Bilirubin 04/11/2022 0.5  0.3 - 1.2 mg/dL Final   GFR, Estimated 04/11/2022 >60  >60 mL/min Final   Comment: (NOTE) Calculated using the CKD-EPI Creatinine Equation (2021)    Anion gap 04/11/2022 10  5 - 15 Final   Performed at [place] [address], [place], [place] [address]   POC Amphetamine UR 04/11/2022 None Detected  NONE DETECTED (Cut Off Level 1000 ng/mL) Final   POC Secobarbital (BAR) 04/11/2022 None Detected  NONE DETECTED (Cut Off Level 300 ng/mL) Final   POC Buprenorphine (BUP) 04/11/2022 None Detected  NONE DETECTED (Cut Off Level 10 ng/mL) Final   POC  Oxazepam (BZO) 04/11/2022 None Detected  NONE DETECTED (Cut Off Level 300 ng/mL) Final   POC Cocaine UR 04/11/2022 None Detected  NONE DETECTED (Cut Off Level 300 ng/mL) Final   POC Methamphetamine UR 04/11/2022 None Detected  NONE DETECTED (Cut Off Level 1000 ng/mL) Final   POC Morphine 04/11/2022 None Detected  NONE DETECTED (Cut Off Level 300 ng/mL) Final   POC Methadone UR 04/11/2022 None Detected  NONE DETECTED (Cut Off Level 300 ng/mL) Final   POC Oxycodone UR 04/11/2022 None Detected  NONE DETECTED (Cut Off Level 100 ng/mL) Final   POC Marijuana UR 04/11/2022 None Detected  NONE DETECTED (Cut Off Level 50 ng/mL) Final  Admission on 03/30/2022, Discharged on 04/01/2022  Component Date Value Ref Range Status   SARS Coronavirus 2 by RT PCR 03/30/2022 NEGATIVE  NEGATIVE Final   Comment: (NOTE) SARS-CoV-2 target nucleic acids are NOT DETECTED.  The SARS-CoV-2 RNA is  generally detectable in upper respiratory specimens during the acute phase of infection. The lowest concentration of SARS-CoV-2 viral copies this assay can detect is 138 copies/mL. A negative result does not preclude SARS-Cov-2 infection and should not be used as the sole basis for treatment or other patient management decisions. A negative result may occur with  improper specimen collection/handling, submission of specimen other than nasopharyngeal swab, presence of viral mutation(s) within the areas targeted by this assay, and inadequate number of viral copies(<138 copies/mL). A negative result must be combined with clinical observations, patient history, and epidemiological information. The expected result is Negative.  Fact Sheet for Patients:  EntrepreneurPulse.com.au  Fact Sheet for Healthcare Providers:  IncredibleEmployment.be  This test is no                          t yet approved or cleared by the [place] FDA and  has been authorized for detection and/or diagnosis of SARS-CoV-2 by FDA under an Emergency Use Authorization (EUA). This EUA will remain  in effect (meaning this test can be used) for the duration of the COVID-19 declaration under Section 564(b)(1) of the Act, 21 U.S.C.section 360bbb-3(b)(1), unless the authorization is terminated  or revoked sooner.       Influenza A by PCR 03/30/2022 NEGATIVE  NEGATIVE Final   Influenza B by PCR 03/30/2022 NEGATIVE  NEGATIVE Final   Comment: (NOTE) The Xpert Xpress SARS-CoV-2/FLU/RSV plus assay is intended as an aid in the diagnosis of influenza from Nasopharyngeal swab specimens and should not be used as a sole basis for treatment. Nasal washings and aspirates are unacceptable for Xpert Xpress SARS-CoV-2/FLU/RSV testing.  Fact Sheet for Patients: EntrepreneurPulse.com.au  Fact Sheet for Healthcare Providers: IncredibleEmployment.be  This  test is not yet approved or cleared by the [place] FDA and has been authorized for detection and/or diagnosis of SARS-CoV-2 by FDA under an Emergency Use Authorization (EUA). This EUA will remain in effect (meaning this test can be used) for the duration of the COVID-19 declaration under Section 564(b)(1) of the Act, 21 U.S.C. section 360bbb-3(b)(1), unless the authorization is terminated or revoked.  Performed at [facility], [place] [address] [place], [place] [address]    POC Amphetamine UR 03/30/2022 Positive (A)  NONE DETECTED (Cut Off Level 1000 ng/mL) Final   POC Secobarbital (BAR) 03/30/2022 None Detected  NONE DETECTED (Cut Off Level 300 ng/mL) Final   POC Buprenorphine (BUP) 03/30/2022 None Detected  NONE DETECTED (Cut Off Level 10 ng/mL) Final   POC Oxazepam (BZO)  03/30/2022 None Detected  NONE DETECTED (Cut Off Level 300 ng/mL) Final   POC Cocaine UR 03/30/2022 None Detected  NONE DETECTED (Cut Off Level 300 ng/mL) Final   POC Methamphetamine UR 03/30/2022 Positive (A)  NONE DETECTED (Cut Off Level 1000 ng/mL) Final   POC Morphine 03/30/2022 None Detected  NONE DETECTED (Cut Off Level 300 ng/mL) Final   POC Methadone UR 03/30/2022 None Detected  NONE DETECTED (Cut Off Level 300 ng/mL) Final   POC Oxycodone UR 03/30/2022 None Detected  NONE DETECTED (Cut Off Level 100 ng/mL) Final   POC Marijuana UR 03/30/2022 Positive (A)  NONE DETECTED (Cut Off Level 50 ng/mL) Final   SARSCOV2ONAVIRUS 2 AG 03/30/2022 NEGATIVE  NEGATIVE Final   Comment: (NOTE) SARS-CoV-2 antigen NOT DETECTED.   Negative results are presumptive.  Negative results do not preclude SARS-CoV-2 infection and should not be used as the sole basis for treatment or other patient management decisions, including infection  control decisions, particularly in the presence of clinical signs and  symptoms consistent with COVID-19, or in those who have been in contact with the virus.  Negative results must be  combined with clinical observations, patient history, and epidemiological information. The expected result is Negative.  Fact Sheet for Patients: HandmadeRecipes.com.cy  Fact Sheet for Healthcare Providers: FuneralLife.at  This test is not yet approved or cleared by the [place] FDA and  has been authorized for detection and/or diagnosis of SARS-CoV-2 by FDA under an Emergency Use Authorization (EUA).  This EUA will remain in effect (meaning this test can be used) for the duration of  the COV                          ID-19 declaration under Section 564(b)(1) of the Act, 21 U.S.C. section 360bbb-3(b)(1), unless the authorization is terminated or revoked sooner.    Admission on 03/29/2022, Discharged on 03/30/2022  Component Date Value Ref Range Status   Sodium 03/29/2022 142  135 - 145 mmol/L Final   Potassium 03/29/2022 3.5  3.5 - 5.1 mmol/L Final   Chloride 03/29/2022 110  98 - 111 mmol/L Final   CO2 03/29/2022 24  22 - 32 mmol/L Final   Glucose, Bld 03/29/2022 108 (H)  70 - 99 mg/dL Final   Glucose reference range applies only to samples taken after fasting for at least 8 hours.   BUN 03/29/2022 13  6 - 20 mg/dL Final   Creatinine, Ser 03/29/2022 1.19  0.61 - 1.24 mg/dL Final   Calcium 03/29/2022 9.2  8.9 - 10.3 mg/dL Final   Total Protein 03/29/2022 7.7  6.5 - 8.1 g/dL Final   Albumin 03/29/2022 4.2  3.5 - 5.0 g/dL Final   AST 03/29/2022 61 (H)  15 - 41 U/L Final   ALT 03/29/2022 48 (H)  0 - 44 U/L Final   Alkaline Phosphatase 03/29/2022 64  38 - 126 U/L Final   Total Bilirubin 03/29/2022 0.9  0.3 - 1.2 mg/dL Final   GFR, Estimated 03/29/2022 >60  >60 mL/min Final   Comment: (NOTE) Calculated using the CKD-EPI Creatinine Equation (2021)    Anion gap 03/29/2022 8  5 - 15 Final   Performed at [facility], [place] [address], [place], [place] [address]   Alcohol, Ethyl (B) 03/29/2022 <10  <10 mg/dL Final    Comment: (NOTE) Lowest detectable limit for serum alcohol is 10 mg/dL.  For medical purposes only. Performed at [facility],  [place] [address] [place], [place] [address]    Salicylate Lvl 25/85/2778 <7.0 (L)  7.0 - 30.0 mg/dL Final   Performed at [place] [address] [place], [place] [address]   Acetaminophen (Tylenol), Serum 03/29/2022 <10 (L)  10 - 30 ug/mL Final   Comment: (NOTE) Therapeutic concentrations vary significantly. A range of 10-30 ug/mL  may be an effective concentration for many patients. However, some  are best treated at concentrations outside of this range. Acetaminophen concentrations >150 ug/mL at 4 hours after ingestion  and >50 ug/mL at 12 hours after ingestion are often associated with  toxic reactions.  Performed at [facility], [place] [address] [place], [place] [address]    WBC 03/29/2022 7.5  4.0 - 10.5 K/uL Final   RBC 03/29/2022 4.21 (L)  4.22 - 5.81 MIL/uL Final   Hemoglobin 03/29/2022 12.2 (L)  13.0 - 17.0 g/dL Final   HCT 03/29/2022 38.1 (L)  39.0 - 52.0 % Final   MCV 03/29/2022 90.5  80.0 - 100.0 fL Final   MCH 03/29/2022 29.0  26.0 - 34.0 pg Final   MCHC 03/29/2022 32.0  30.0 - 36.0 g/dL Final   RDW 03/29/2022 15.3  11.5 - 15.5 % Final   Platelets 03/29/2022 228  150 - 400 K/uL Final   nRBC 03/29/2022 0.0  0.0 - 0.2 % Final   Performed at [facility], [place] [address], [place], [place] [address]   Opiates 03/29/2022 NONE DETECTED  NONE DETECTED Final   Cocaine 03/29/2022 NONE DETECTED  NONE DETECTED Final   Benzodiazepines 03/29/2022 NONE DETECTED  NONE DETECTED Final   Amphetamines 03/29/2022 POSITIVE (A)  NONE DETECTED Final   Tetrahydrocannabinol 03/29/2022 POSITIVE (A)  NONE DETECTED Final   Barbiturates 03/29/2022 NONE DETECTED  NONE DETECTED Final   Comment: (NOTE) DRUG SCREEN FOR MEDICAL PURPOSES ONLY.  IF CONFIRMATION IS NEEDED FOR ANY PURPOSE, NOTIFY  LAB WITHIN 5 DAYS.  LOWEST DETECTABLE LIMITS FOR URINE DRUG SCREEN Drug Class                     Cutoff (ng/mL) Amphetamine and metabolites    1000 Barbiturate and metabolites    200 Benzodiazepine                 086 Tricyclics and metabolites     300 Opiates and metabolites        300 Cocaine and metabolites        300 THC                            50 Performed at [facility], [place] [address], [place], [place] [address]    Troponin I (High Sensitivity) 03/29/2022 3  <18 ng/L Final   Comment: (NOTE) Elevated high sensitivity troponin I (hsTnI) values and significant  changes across serial measurements may suggest ACS but many other  chronic and acute conditions are known to elevate hsTnI results.  Refer to the "Links" section for chest pain algorithms and additional  guidance. Performed at [facility], [place] [address] [place], [place] [address]    SARS Coronavirus 2 by RT PCR 03/29/2022 NEGATIVE  NEGATIVE Final   Comment: (NOTE) SARS-CoV-2 target nucleic acids are NOT DETECTED.  The SARS-CoV-2 RNA is generally detectable in upper and lower respiratory specimens during the acute phase of infection. The lowest concentration of SARS-CoV-2 viral copies this assay  can detect is 250 copies / mL. A negative result does not preclude SARS-CoV-2 infection and should not be used as the sole basis for treatment or other patient management decisions.  A negative result may occur with improper specimen collection / handling, submission of specimen other than nasopharyngeal swab, presence of viral mutation(s) within the areas targeted by this assay, and inadequate number of viral copies (<250 copies / mL). A negative result must be combined with clinical observations, patient history, and epidemiological information.  Fact Sheet for Patients:   https://www.patel.info/  Fact Sheet for Healthcare  Providers: https://hall.com/  This test is not yet approved or                           cleared by the [place] FDA and has been authorized for detection and/or diagnosis of SARS-CoV-2 by FDA under an Emergency Use Authorization (EUA).  This EUA will remain in effect (meaning this test can be used) for the duration of the COVID-19 declaration under Section 564(b)(1) of the Act, 21 U.S.C. section 360bbb-3(b)(1), unless the authorization is terminated or revoked sooner.  Performed at [facility], [place] [address], [place], [place] [address]    SARS Coronavirus 2 by RT PCR 03/30/2022 NEGATIVE  NEGATIVE Final   Comment: (NOTE) SARS-CoV-2 target nucleic acids are NOT DETECTED.  The SARS-CoV-2 RNA is generally detectable in upper respiratory specimens during the acute phase of infection. The lowest concentration of SARS-CoV-2 viral copies this assay can detect is 138 copies/mL. A negative result does not preclude SARS-Cov-2 infection and should not be used as the sole basis for treatment or other patient management decisions. A negative result may occur with  improper specimen collection/handling, submission of specimen other than nasopharyngeal swab, presence of viral mutation(s) within the areas targeted by this assay, and inadequate number of viral copies(<138 copies/mL). A negative result must be combined with clinical observations, patient history, and epidemiological information. The expected result is Negative.  Fact Sheet for Patients:  EntrepreneurPulse.com.au  Fact Sheet for Healthcare Providers:  IncredibleEmployment.be  This test is no                          t yet approved or cleared by the [place] FDA and  has been authorized for detection and/or diagnosis of SARS-CoV-2 by FDA under an Emergency Use Authorization (EUA). This EUA will remain  in effect (meaning this test can be  used) for the duration of the COVID-19 declaration under Section 564(b)(1) of the Act, 21 U.S.C.section 360bbb-3(b)(1), unless the authorization is terminated  or revoked sooner.       Influenza A by PCR 03/30/2022 NEGATIVE  NEGATIVE Final   Influenza B by PCR 03/30/2022 NEGATIVE  NEGATIVE Final   Comment: (NOTE) The Xpert Xpress SARS-CoV-2/FLU/RSV plus assay is intended as an aid in the diagnosis of influenza from Nasopharyngeal swab specimens and should not be used as a sole basis for treatment. Nasal washings and aspirates are unacceptable for Xpert Xpress SARS-CoV-2/FLU/RSV testing.  Fact Sheet for Patients: EntrepreneurPulse.com.au  Fact Sheet for Healthcare Providers: IncredibleEmployment.be  This test is not yet approved or cleared by the [place] FDA and has been authorized for detection and/or diagnosis of SARS-CoV-2 by FDA under an Emergency Use Authorization (EUA). This EUA will remain in effect (meaning this test can be used) for the duration of the COVID-19 declaration under Section 564(b)(1) of the Act,  21 U.S.C. section 360bbb-3(b)(1), unless the authorization is terminated or revoked.  Performed at [facility], [place] [address] [place], [place] [address]   Admission on 12/09/2021, Discharged on 12/10/2021  Component Date Value Ref Range Status   Color, Urine 12/09/2021 YELLOW  YELLOW Final   APPearance 12/09/2021 CLEAR  CLEAR Final   Specific Gravity, Urine 12/09/2021 1.010  1.005 - 1.030 Final   pH 12/09/2021 6.0  5.0 - 8.0 Final   Glucose, UA 12/09/2021 NEGATIVE  NEGATIVE mg/dL Final   Hgb urine dipstick 12/09/2021 NEGATIVE  NEGATIVE Final   Bilirubin Urine 12/09/2021 NEGATIVE  NEGATIVE Final   Ketones, ur 12/09/2021 NEGATIVE  NEGATIVE mg/dL Final   Protein, ur 12/09/2021 NEGATIVE  NEGATIVE mg/dL Final   Nitrite 12/09/2021 NEGATIVE  NEGATIVE Final   Leukocytes,Ua 12/09/2021 NEGATIVE  NEGATIVE  Final   Performed at [place] [address] [place], [place] [address]   WBC 12/09/2021 13.2 (H)  4.0 - 10.5 K/uL Final   RBC 12/09/2021 3.96 (L)  4.22 - 5.81 MIL/uL Final   Hemoglobin 12/09/2021 11.5 (L)  13.0 - 17.0 g/dL Final   HCT 12/09/2021 35.4 (L)  39.0 - 52.0 % Final   MCV 12/09/2021 89.4  80.0 - 100.0 fL Final   MCH 12/09/2021 29.0  26.0 - 34.0 pg Final   MCHC 12/09/2021 32.5  30.0 - 36.0 g/dL Final   RDW 12/09/2021 13.2  11.5 - 15.5 % Final   Platelets 12/09/2021 294  150 - 400 K/uL Final   nRBC 12/09/2021 0.0  0.0 - 0.2 % Final   Neutrophils Relative % 12/09/2021 70  % Final   Neutro Abs 12/09/2021 9.2 (H)  1.7 - 7.7 K/uL Final   Lymphocytes Relative 12/09/2021 14  % Final   Lymphs Abs 12/09/2021 1.8  0.7 - 4.0 K/uL Final   Monocytes Relative 12/09/2021 11  % Final   Monocytes Absolute 12/09/2021 1.5 (H)  0.1 - 1.0 K/uL Final   Eosinophils Relative 12/09/2021 5  % Final   Eosinophils Absolute 12/09/2021 0.7 (H)  0.0 - 0.5 K/uL Final   Basophils Relative 12/09/2021 0  % Final   Basophils Absolute 12/09/2021 0.1  0.0 - 0.1 K/uL Final   Immature Granulocytes 12/09/2021 0  % Final   Abs Immature Granulocytes 12/09/2021 0.04  0.00 - 0.07 K/uL Final   Performed at [facility], [place] [address] [place], [place] [address]   Sodium 12/09/2021 136  135 - 145 mmol/L Final   Potassium 12/09/2021 4.1  3.5 - 5.1 mmol/L Final   Chloride 12/09/2021 102  98 - 111 mmol/L Final   CO2 12/09/2021 27  22 - 32 mmol/L Final   Glucose, Bld 12/09/2021 112 (H)  70 - 99 mg/dL Final   Glucose reference range applies only to samples taken after fasting for at least 8 hours.   BUN 12/09/2021 16  6 - 20 mg/dL Final   Creatinine, Ser 12/09/2021 0.96  0.61 - 1.24 mg/dL Final   Calcium 12/09/2021 8.8 (L)  8.9 - 10.3 mg/dL Final   GFR, Estimated 12/09/2021 >60  >60 mL/min Final   Comment: (NOTE) Calculated using the CKD-EPI Creatinine Equation (2021)    Anion  gap 12/09/2021 7  5 - 15 Final   Performed at [facility], [place] [address] [place], [place] [address]   Specimen Description 12/09/2021    Final  Value:URINE, CLEAN CATCH Performed at [facility], [place] [address] [place], [place] [address]    Special Requests 12/09/2021    Final                   Value:NONE Performed at [facility], [place] [address] [place], [place] [address]    Culture 12/09/2021    Final                   Value:NO GROWTH Performed at [facility], [place] [address], [place], [place] [address]    Report Status 12/09/2021 12/11/2021 FINAL   Final   SARS Coronavirus 2 by RT PCR 12/10/2021 NEGATIVE  NEGATIVE Final   Comment: (NOTE) SARS-CoV-2 target nucleic acids are NOT DETECTED.  The SARS-CoV-2 RNA is generally detectable in upper respiratory specimens during the acute phase of infection. The lowest concentration of SARS-CoV-2 viral copies this assay can detect is 138 copies/mL. A negative result does not preclude SARS-Cov-2 infection and should not be used as the sole basis for treatment or other patient management decisions. A negative result may occur with  improper specimen collection/handling, submission of specimen other than nasopharyngeal swab, presence of viral mutation(s) within the areas targeted by this assay, and inadequate number of viral copies(<138 copies/mL). A negative result must be combined with clinical observations, patient history, and epidemiological information. The expected result is Negative.  Fact Sheet for Patients:  EntrepreneurPulse.com.au  Fact Sheet for Healthcare Providers:  IncredibleEmployment.be  This test is no                          t yet approved or cleared by the [place] FDA and  has been authorized for detection and/or diagnosis of SARS-CoV-2 by FDA under an Emergency Use Authorization (EUA). This  EUA will remain  in effect (meaning this test can be used) for the duration of the COVID-19 declaration under Section 564(b)(1) of the Act, 21 U.S.C.section 360bbb-3(b)(1), unless the authorization is terminated  or revoked sooner.       Influenza A by PCR 12/10/2021 NEGATIVE  NEGATIVE Final   Influenza B by PCR 12/10/2021 NEGATIVE  NEGATIVE Final   Comment: (NOTE) The Xpert Xpress SARS-CoV-2/FLU/RSV plus assay is intended as an aid in the diagnosis of influenza from Nasopharyngeal swab specimens and should not be used as a sole basis for treatment. Nasal washings and aspirates are unacceptable for Xpert Xpress SARS-CoV-2/FLU/RSV testing.  Fact Sheet for Patients: EntrepreneurPulse.com.au  Fact Sheet for Healthcare Providers: IncredibleEmployment.be  This test is not yet approved or cleared by the [place] FDA and has been authorized for detection and/or diagnosis of SARS-CoV-2 by FDA under an Emergency Use Authorization (EUA). This EUA will remain in effect (meaning this test can be used) for the duration of the COVID-19 declaration under Section 564(b)(1) of the Act, 21 U.S.C. section 360bbb-3(b)(1), unless the authorization is terminated or revoked.  Performed at [facility], [place] [address], [place], [place] [address]    Troponin I (High Sensitivity) 12/10/2021 12  <18 ng/L Final   Comment: (NOTE) Elevated high sensitivity troponin I (hsTnI) values and significant  changes across serial measurements may suggest ACS but many other  chronic and acute conditions are known to elevate hsTnI results.  Refer to the "Links" section for chest pain algorithms and additional  guidance. Performed at [facility], [place] [address] [place], [place] [address]  Specimen Description 12/10/2021    Final                   Value:BLOOD BLOOD RIGHT HAND Performed at [facility], [place]  [address], [place], [place] [address]    Special Requests 12/10/2021    Final                   Value:BOTTLES DRAWN AEROBIC AND ANAEROBIC Blood Culture adequate volume Performed at [place] [address], [place], [place] [address]    Culture 12/10/2021    Final                   Value:NO GROWTH 5 DAYS Performed at [place] [address], [place], [place] [address]    Report Status 12/10/2021 12/15/2021 FINAL   Final   Specimen Description 12/10/2021    Final                   Value:BLOOD BLOOD RIGHT ARM Performed at [facility], [place] [address] [place], [place] [address]    Special Requests 12/10/2021    Final                   Value:BOTTLES DRAWN AEROBIC AND ANAEROBIC Blood Culture adequate volume Performed at [place] [address] [place], [place] [address]    Culture 12/10/2021    Final                   Value:NO GROWTH 5 DAYS Performed at [place] [address], [place], [place] [address]    Report Status 12/10/2021 12/15/2021 FINAL   Final   HIV-1 P24 Antigen - HIV24 12/10/2021 NON REACTIVE  NON REACTIVE Final   Comment: (NOTE) Detection of p24 may be inhibited by biotin in the sample, causing false negative results in acute infection.    HIV 1/2 Antibodies 12/10/2021 NON REACTIVE  NON REACTIVE Final   Interpretation (HIV Ag Ab) 12/10/2021 A non reactive test result means that HIV 1 or HIV 2 antibodies and HIV 1 p24 antigen were not detected in the specimen.   Final   Comment: RESULT CALLED TO, READ BACK BY AND VERIFIED WITH: BRATU,D. EMTP AT 1237 12/10/21 MULLINS,T Performed at [facility], [place] [address], [place], [place] [address]   Admission on 11/26/2021, Discharged on 11/26/2021  Component Date Value Ref Range Status   Sodium 11/26/2021 137  135 - 145 mmol/L Final   Potassium 11/26/2021 3.4 (L)  3.5 - 5.1 mmol/L Final   Chloride 11/26/2021 104  98 - 111  mmol/L Final   CO2 11/26/2021 25  22 - 32 mmol/L Final   Glucose, Bld 11/26/2021 114 (H)  70 - 99 mg/dL Final   Glucose reference range applies only to samples taken after fasting for at least 8 hours.   BUN 11/26/2021 14  6 - 20 mg/dL Final   Creatinine, Ser 11/26/2021 1.36 (H)  0.61 - 1.24 mg/dL Final   Calcium 11/26/2021 9.4  8.9 - 10.3 mg/dL Final   GFR, Estimated 11/26/2021 >60  >60 mL/min Final   Comment: (NOTE) Calculated using the CKD-EPI Creatinine Equation (2021)    Anion gap 11/26/2021 8  5 - 15 Final   Performed at [facility], [place] [address], [place], [place] [address]   WBC 11/26/2021 7.9  4.0 - 10.5 K/uL Final   RBC 11/26/2021  4.37  4.22 - 5.81 MIL/uL Final   Hemoglobin 11/26/2021 13.1  13.0 - 17.0 g/dL Final   HCT 11/26/2021 38.8 (L)  39.0 - 52.0 % Final   MCV 11/26/2021 88.8  80.0 - 100.0 fL Final   MCH 11/26/2021 30.0  26.0 - 34.0 pg Final   MCHC 11/26/2021 33.8  30.0 - 36.0 g/dL Final   RDW 11/26/2021 13.0  11.5 - 15.5 % Final   Platelets 11/26/2021 273  150 - 400 K/uL Final   nRBC 11/26/2021 0.0  0.0 - 0.2 % Final   Performed at [facility], [place] [address] [place], [place] [address]   Troponin I (High Sensitivity) 11/26/2021 2  <18 ng/L Final   Comment: (NOTE) Elevated high sensitivity troponin I (hsTnI) values and significant  changes across serial measurements may suggest ACS but many other  chronic and acute conditions are known to elevate hsTnI results.  Refer to the "Links" section for chest pain algorithms and additional  guidance. Performed at [facility], [place] [address] [place], [place] [address]    Troponin I (High Sensitivity) 11/26/2021 2  <18 ng/L Final   Comment: (NOTE) Elevated high sensitivity troponin I (hsTnI) values and significant  changes across serial measurements may suggest ACS but many other  chronic and acute conditions are known to elevate hsTnI results.  Refer to the  "Links" section for chest pain algorithms and additional  guidance. Performed at [facility], [place] [address], [place], [place] [address]    Total CK 11/26/2021 412 (H)  49 - 397 U/L Final   Performed at [facility], [place] [address] [place], [place] [address]    Blood Alcohol level:  Lab Results  Component Value Date   [facility] <10 03/29/2022   ETH <10 86/76/7209    Metabolic Disorder Labs: Lab Results  Component Value Date   HGBA1C 5.8 (H) 01/27/2021   MPG 119.76 01/27/2021   MPG 122.63 12/21/2020   No results found for: "PROLACTIN" Lab Results  Component Value Date   CHOL 122 01/27/2021   TRIG 75 01/27/2021   HDL 40 (L) 01/27/2021   CHOLHDL 3.1 01/27/2021   VLDL 15 01/27/2021   LDLCALC 67 01/27/2021   LDLCALC 72 12/21/2020    Therapeutic Lab Levels: No results found for: "LITHIUM" No results found for: "VALPROATE" No results found for: "CBMZ"  Physical Findings   AIMS    Flowsheet Row Admission (Discharged) from 12/21/2020 in [place] [facility] Admission (Discharged) from 03/25/2018 in [place] [facility]  AIMS Total Score 0 0      AUDIT    Flowsheet Row Admission (Discharged) from 12/21/2020 in [facility] ED from 05/02/2020 in [facility] Admission (Discharged) from 03/25/2018 in [facility]  Alcohol Use Disorder Identification Test Final Score (AUDIT) 0 4 0      PHQ2-9    Flowsheet Row ED from 03/30/2022 in [facility] ED from 05/02/2020 in [facility] DEPT  PHQ-2 Total Score 4 3  PHQ-9 Total Score 5 12      Flowsheet Row ED from 03/30/2022 in [facility] ED from 03/29/2022 in [facility] DEPT ED from 03/25/2022 in [facility] DEPT  C-SSRS  RISK CATEGORY No Risk High Risk No Risk        Musculoskeletal  Strength &  Muscle Tone: within normal limits Gait & Station: normal Patient leans: N/A  Psychiatric Specialty Exam  Presentation  General Appearance: Appropriate for Environment; Casual  Eye Contact:Good  Speech:Clear and Coherent; Normal Rate  Speech Volume:Normal  Handedness:Right   Mood and Affect  Mood:Anxious; Depressed  Affect:Congruent   Thought Process  Thought Processes:Coherent  Descriptions of Associations:Intact  Orientation:Full (Time, Place and Person)  Thought Content:WDL  Diagnosis of Schizophrenia or Schizoaffective disorder in past: No    Hallucinations:Hallucinations: None  Ideas of Reference:None  Suicidal Thoughts:Suicidal Thoughts: No  Homicidal Thoughts:Homicidal Thoughts: No   Sensorium  Memory:Immediate Good; Recent Good; Remote Good  Judgment:Good  Insight:Good   Executive Functions  Concentration:Good  Attention Span:Good  [place] of Knowledge:Good  Language:Good   Psychomotor Activity  Psychomotor Activity:Psychomotor Activity: Normal   Assets  Assets:Desire for Improvement; Armed forces logistics/support/administrative officer; Financial Resources/Insurance; Physical Health; Leisure Time; Resilience   Sleep  Sleep:Sleep: Fair   Nutritional Assessment (For OBS and [facility]BC admissions only) Has the patient had a weight loss or gain of 10 pounds or more in the last 3 months?: No Has the patient had a decrease in food intake/or appetite?: No Does the patient have dental problems?: No Does the patient have eating habits or behaviors that may be indicators of an eating disorder including binging or inducing vomiting?: No Has the patient recently lost weight without trying?: 0 Has the patient been eating poorly because of a decreased appetite?: 0 Malnutrition Screening Tool Score: 0    Physical Exam  Physical Exam Vitals and nursing note reviewed.  Constitutional:       General: He is not in acute distress.    Appearance: He is well-developed.  HENT:     Head: Normocephalic and atraumatic.  Eyes:     General:        Right eye: No discharge.        Left eye: No discharge.     Conjunctiva/sclera: Conjunctivae normal.  Cardiovascular:     Rate and Rhythm: Normal rate and regular rhythm.     Heart sounds: No murmur heard. Pulmonary:     Effort: Pulmonary effort is normal. No respiratory distress.     Breath sounds: Normal breath sounds.  Abdominal:     Palpations: Abdomen is soft.     Tenderness: There is no abdominal tenderness.  Musculoskeletal:        General: No swelling. Normal range of motion.     Cervical back: Normal range of motion and neck supple.  Skin:    General: Skin is warm and dry.     Capillary Refill: Capillary refill takes less than 2 seconds.     Coloration: Skin is not jaundiced or pale.  Neurological:     Mental Status: He is alert and oriented to person, place, and time.  Psychiatric:        Attention and Perception: Attention and perception normal.        Mood and Affect: Mood is anxious.        Speech: Speech normal.        Behavior: Behavior normal. Behavior is cooperative.        Thought Content: Thought content normal.        Cognition and Memory: Cognition normal.        Judgment: Judgment is impulsive.    Review of Systems  Constitutional: Negative.   HENT: Negative.    Eyes: Negative.   Respiratory: Negative.    Cardiovascular: Negative.  Musculoskeletal: Negative.   Skin: Negative.   Neurological: Negative.   Psychiatric/Behavioral:  Positive for depression. The patient is nervous/anxious.    Blood pressure 93/68, pulse 98, temperature 97.8 F (36.6 C), temperature source Oral, resp. rate 18, SpO2 97 %. There is no height or weight on file to calculate BMI.  Treatment Plan Summary: Disposition: Per patient he has talked to [facility] and has an available bed at [facility] on 04/14/2022.  He  is requesting to be discharged at that time.  We will continue to have daily contact with patient to assess and evaluate symptoms and progress in treatment and Medication management  Revonda Humphrey, NP 04/12/2022 9:55 AM

## 2022-04-12 NOTE — ED Notes (Signed)
Brian [name] when to sleep shortly after having a meal, remains asleep at this time no distressed or disturbed sleep pattern noted.

## 2022-04-12 NOTE — ED Notes (Signed)
Pt asleep in bed. Respirations even and unlabored. Will continue to monitor for safety. ?

## 2022-04-12 NOTE — ED Notes (Signed)
Pt was given cereal, coffee, and milk for breakfast.

## 2022-04-13 MED ORDER — GABAPENTIN 300 MG PO CAPS
300.0000 mg | ORAL_CAPSULE | Freq: Three times a day (TID) | ORAL | 1 refills | Status: DC
Start: 2022-04-13 — End: 2022-05-06

## 2022-04-13 MED ORDER — LIPITOR 80 MG PO TABS: 80.0000 mg | ORAL_TABLET | Freq: Every day | ORAL | 1 refills | Status: DC

## 2022-04-13 MED ORDER — GABAPENTIN 300 MG PO CAPS: 300.0000 mg | ORAL_CAPSULE | Freq: Three times a day (TID) | ORAL | 1 refills | Status: DC

## 2022-04-13 MED ORDER — QUETIAPINE FUMARATE 200 MG PO TABS: 200.0000 mg | ORAL_TABLET | Freq: Every day | ORAL | 1 refills | Status: DC

## 2022-04-13 MED ORDER — BUPROPION HCL ER (XL) 300 MG PO TB24: 300.0000 mg | ORAL_TABLET | Freq: Every day | ORAL | 1 refills | Status: DC

## 2022-04-13 MED ORDER — HYDROXYZINE HCL 25 MG PO TABS
25.0000 mg | ORAL_TABLET | Freq: Three times a day (TID) | ORAL | 1 refills | Status: DC | PRN
Start: 2022-04-13 — End: 2022-05-06

## 2022-04-13 MED ORDER — ISOSORBIDE MONONITRATE ER 30 MG PO TB24
15.0000 mg | ORAL_TABLET | Freq: Every day | ORAL | 1 refills | Status: DC
Start: 2022-04-14 — End: 2022-04-13

## 2022-04-13 MED ORDER — ISOSORBIDE MONONITRATE ER 30 MG PO TB24
15.0000 mg | ORAL_TABLET | Freq: Every day | ORAL | 1 refills | Status: DC
Start: 2022-04-14 — End: 2022-05-06

## 2022-04-13 MED ORDER — HYDROXYZINE HCL 25 MG PO TABS
25.0000 mg | ORAL_TABLET | Freq: Three times a day (TID) | ORAL | 1 refills | Status: DC | PRN
Start: 2022-04-13 — End: 2022-04-13

## 2022-04-13 NOTE — Progress Notes (Signed)
Patient doing well.  He is pleasant, organized and logical.  He is recovery focused and has good insight into illness.  [facility] and is aware and pleased with plan.  Will continue top monitor.

## 2022-04-13 NOTE — ED Notes (Signed)
Snacks given 

## 2022-04-13 NOTE — ED Notes (Signed)
Patient is awake and alert on unit.  He is pleasant, organized and logical.  He has been reading recovery literature and writing.  He is recovery focused and appears motivated for treatment.  Patient makes needs known appropriately.  Will monitor and provide a safe environment for him.  Will attempt to meet needs as they arise

## 2022-04-13 NOTE — Discharge Instructions (Addendum)
Substance Abuse Treatment Programs ° °Intensive Outpatient Programs °[facility]     °[address]      °[place], [place]                   °[phone]      ° °[facility] °[address] °[place], [place] °336-379-7146 ° °[facility]     °(Inpatient and outpatient)     °[address]           °[phone]   ° °[facility] °336-288-1484 (Suboxone and Methadone) ° °[address]      °[place], [place] [address]      °336-882-2125      ° °[address] °[place], [place] °852-3033 ° °[facility] (Outpatient/Inpatient, [facility])    °(insurance only) [phone]      °       °[facility] (Groups & Residential) °[place], [place] °336-389-1413 ° °   °[facility]     °[address]     °[place], [place]      °336-389-1413      ° °[facility] (for caregivers and family) °[address] °[place], [place] °336-299-4655 ° ° ° ° ° °Residential Treatment Programs °[facility]      °[address], [place], [place] [address]  °([phone]      ° °[facility] °[address] [place], [place] [address] °[phone] ° °[facility]        °336-248-8914      ° °[facility] °1-800-659-3381 ° °[facility]             °[address]                                         °[place], [place]                                                °877-615-2722 or 336-784-9470                              ° °[facility] [place] °[address] °[place], [address] °1.877.941.8954 ° °[facility]    °[address]      °[place], [place]     °[phone]      ° °[facility] °[address] °[place], [place] °336-285-9073 ° °[facility]   °[address]     °[place], [place] [address]     °336-899-1550      °Admissions: 8am-3pm M-F ° °[facility] °[address] °[place] °336-227-7417 ° °[facility]: Residential Program (90 Days)   °[place], [place]      °336-725-8389 or 800-758-6077    ° °[facility] °[place], [place] °(Walk in Hours over the weekend or by referral) ° °[facility] °[address], [place], [place] [address] °(336) [phone] ° °Crisis Mobile: [facility]:  1-877-626-1772 (for crisis response 24 hours a day) °[facility] Hotline:      1-800-256-2452 °Outpatient Psychiatry and Counseling ° °[facility]: Mobile Crisis   Management 24 hours:  1-877-626-1772 ° °[facility] sliding scale fee and walk in schedule: M-F 8am-12pm/1pm-3pm °[address]  °[place], [place] [address] °336-387-6161 ° °[facility] °[address] °[place], [place] [address] °336-703-9650 ° °[facility] ([facility])- new patient walk-in appointments available Monday - Friday 8am -3pm.          °[address] °[place], [place] [address] °336-676-6840 or crisis line- 336-676-6905 ° °[facility] Intensive Outpatient Therapy Program °[address] °[place], [place] [address] °336-832-9804 ° °[facility]                  °Crisis Services      °336.641.4993      °[address]     °[place], [place] [address]                ° °[facility]   °[facility] °[phone] °[address] °[place], [place] [address] ° ° °[facility]          °[address],  °[place], [place] [address]       °(336) [phone] ° °[facility] °[address] °[place], [place] [address] °336-292-1510 ° °[facility]    °[address]    °[place], [place] [address]     °336-632-3505      ° °Parish McKinney, MD     °[address]     °[place] [address]     °336-282-1251     °  °[facility] °[address] °[place] [address] ° °[facility]     °[address]     °[place], [place]      °336-542-2076      ° °[facility] °Shamsher Ahluwalia, MD °[address] °[place], [place] [address] °336-420-9558 ° °[facility]     °[address]     °[place], [place] [address]     °336-274-1237      ° °[facility] °[address] °[place], [place] [address] °336-854-4450 °Resources for Temporary Residential Assistance/Crisis Centers ° °[facility] °[facility] °M-F 8am-3pm   °[address] [place], [place] [address]   336-332-0824 °Services include: laundry, barbering, support groups, case management, phone  & computer access, showers, AA/NA mtgs, mental health/substance abuse nurse, job skills class, disability information, VA assistance, spiritual classes, etc.  ° °HOMELESS SHELTERS ° °[facility]     °[facility]   °[address], [place]     °336.271.5959       °       °[facility] (women and children)       °[address] °[place], [place] [address] °336-275-0820 °[email or web address] for application and process °Application Required ° °[facility]   °[address]    °[place] [address]     °[phone]       °             °[facility] °[address] °,  [address] °336.273.5572 °336-235-0363(schedule application appt.) °Application Required ° °[facility] (women only)    °[address]     °[place],  [address]     °336-884-1039      °  Intake starts 6pm daily °Need valid ID, SSC, & Police report °[facility] [place] °[address] °[place], [place] °336-881-5420 °Application Required ° °[facility] (men only)     °[address]      °[place], [place]     °[phone]      ° °[facility] °(Pregnant women only) °[address] °[place], [place] °336-275-0206 ° °[facility]      °[address]      °[place], [place] [address]     °336-722-9951      °       °[facility] °[address] °[place], [place] °336-723-1848 °90 day commitment/SA/Application process ° °[facility](men only)     °[address]     °[place], [place]     °336-748-1962       °Check-in at 7pm     °       °[facility] °[address] °[place], [place] [address] °336-248-6684 °Men/Women/Women and Children must be there by 7 pm ° °[facility] °[place], [place] °336-722-8721                ° °

## 2022-04-13 NOTE — ED Notes (Signed)
Pt asleep in bed. Respirations even and unlabored. Will continue to monitor for safety. ?

## 2022-04-13 NOTE — ED Provider Notes (Signed)
FBC/OBS ASAP Discharge Summary  Date and Time: 04/13/2022 3:14 PM  Name: Brian [name]  MRN:  [id number]   Discharge Diagnoses:  Final diagnoses:  Methamphetamine abuse ([place])  Substance induced mood disorder ([employer])    Subjective:  Brian [name] 36 y.o., male patient who initially presented presented to [facility] as a walk-in on 04/11/2022 he was admitted to the continuous assessment unit.  He was reevaluated and transferred to the [facility] on 04/12/2022 while awaiting bed availability at [facility], 36 y.o., male patient seen face to face by this provider, consulted with Dr. Dwyane Dee; and chart reviewed on 04/13/22.  Per chart review patient has a past psychiatric history of bipolar disorder, polysubstance (methamphetamines, cannabis, alcohol, and opioids), depression and suicidal ideations.  He denies any history of alcohol withdrawal seizure or delirium tremors.  He denies any recent substance use.  UDS is negative on admission.  BAL was not completed.  Patient was discharged from the [facility] on 03/31/2022 and transferred to [facility] for residential treatment.  [facility] determined he was not stable enough at that time and sent him to their [facility] in [place] for stabilization.  He was to return to [facility] on 04/11/2022 but upon arrival [facility] was closed for admissions.  Patient has spoken with Sharyn Lull and states that he has a bed available for 04/14/2022.  During evaluation Brian [name] is observed sitting in the day room eating his breakfast.  He is alert/oriented x4 and cooperative.  He is pleasant and makes good eye contact.  He took continues to report some mild depression, but overall states he feels "good".  He has normal speech and behavior.  He has a euthymic affect.  He expresses his appreciation for being admitted to the [facility] while awaiting bed availability at [facility].  He denies SI/HI/AVH.  He is able to contract for safety.  He denies access to firearms/weapons.   He is able to converse coherently and is able to answer questions logically.  He appears motivated for treatment and has engaged with staff with substance abuse information/education.  Stay Summary:   Patient has remained calm and cooperative while on the unit.  He has been pleasant and appropriate with staff.  He has been compliant with medication.  He has exhibited no unsafe behaviors.  Patient is requesting to be discharged in the a.m. and transported to [facility] residential treatment in [facility] _0 :45am. He is adamant that he has a bed waiting for him at the facility.  He will be discharged with 14-day sample and printed prescriptions that include 1 refill of the following medications: Wellbutrin 300 mg 24-hour tablet daily, gabapentin 300 mg 3 times daily, isosorbide mononitrate 15 mg daily, Lipitor 80 mg daily, Seroquel 200 mg nightly, and hydroxyzine 25 mg 3 times daily as needed. Medications, which were tolerated with no adverse reactions. He was instructed on how to take medications as prescribed.    Upon completion of this admission the Brian [name] was both mentally and medically stable for discharge denying suicidal/homicidal ideation, auditory/visual/tactile hallucinations, delusional thoughts and paranoia.     Total Time spent with patient: 30 minutes  Past Psychiatric History: See H&P Past Medical History:  Past Medical History:  Diagnosis Date   Alcohol abuse    Anxiety    Bipolar 2 disorder ([place])    Cocaine abuse ([place])    Depression    Kidney stones    Methamphetamine abuse, episodic ([facility])    Myocardial  infarction [facility])    Narcotic abuse ([place])    Peptic ulcer    Polysubstance abuse ([place])    Renal disorder    kidney stones    Past Surgical History:  Procedure Laterality Date   ESOPHAGOGASTRODUODENOSCOPY N/A 05/29/2014   Procedure: ESOPHAGOGASTRODUODENOSCOPY (EGD) ;  Surgeon: Beryle Beams, MD;  Location: [facility] ENDOSCOPY;  Service: Endoscopy;  Laterality: N/A;  check  with Dr. Collene Mares about sedation type/timinng - I recommend MAC   HAND SURGERY  2006   LEFT HEART CATH AND CORONARY ANGIOGRAPHY N/A 06/21/2020   Procedure: LEFT HEART CATH AND CORONARY ANGIOGRAPHY;  Surgeon: Nelva Bush, MD;  Location: [facility];  Service: Cardiovascular;  Laterality: N/A;   Family History:  Family History  Problem Relation Age of Onset   Heart disease Father    Family Psychiatric History: See H&P Social History:  Social History   Substance and Sexual Activity  Alcohol Use Not Currently     Social History   Substance and Sexual Activity  Drug Use Yes   Types: Marijuana, Cocaine, IV, Methamphetamines   Comment: "meth yesterday morning" per pt    Social History   Socioeconomic History   Marital status: Single    Spouse name: Not on file   Number of children: Not on file   Years of education: Not on file   Highest education level: Not on file  Occupational History   Not on file  Tobacco Use   Smoking status: Every Day    Packs/day: 0.50    Types: Cigarettes   Smokeless tobacco: Never  Vaping Use   Vaping Use: Never used  Substance and Sexual Activity   Alcohol use: Not Currently   Drug use: Yes    Types: Marijuana, Cocaine, IV, Methamphetamines    Comment: "meth yesterday morning" per pt   Sexual activity: Not Currently  Other Topics Concern   Not on file  Social History Narrative   ** Merged History Encounter **       Lives with a friend   Lost house and custody of son 9broke up with fiancee)   Nature conservation officer   Social Determinants of Health   Financial Resource Strain: Not on file  Food Insecurity: Not on file  Transportation Needs: Not on file  Physical Activity: Not on file  Stress: Not on file  Social Connections: Not on file   SDOH:  SDOH Screenings   Alcohol Screen: Low Risk  (12/21/2020)   Alcohol Screen    Last Alcohol Screening Score (AUDIT): 0  Depression (PHQ2-9): Low Risk  (04/12/2022)   Depression (PHQ2-9)     PHQ-2 Score: 3  Recent Concern: Depression (PHQ2-9) - Medium Risk (03/31/2022)   Depression (PHQ2-9)    PHQ-2 Score: 5  Financial Resource Strain: Not on file  Food Insecurity: Not on file  Housing: High Risk (08/09/2020)   Housing    Last Housing Risk Score: 2  Physical Activity: Not on file  Social Connections: Not on file  Stress: Not on file  Tobacco Use: High Risk (03/31/2022)   Patient History    Smoking Tobacco Use: Every Day    Smokeless Tobacco Use: Never    Passive Exposure: Not on file  Transportation Needs: Not on file    Tobacco Cessation:  N/A, patient does not currently use tobacco products  Current Medications:  Current Facility-Administered Medications  Medication Dose Route Frequency Provider Last Rate Last Admin   acetaminophen (TYLENOL) tablet 650 mg  650 mg Oral  Q6H PRN Ajibola, Ene A, NP       alum & mag hydroxide-simeth (MAALOX/MYLANTA) 200-200-20 MG/5ML suspension 30 mL  30 mL Oral Q4H PRN Ajibola, Ene A, NP       atorvastatin (LIPITOR) tablet 80 mg  80 mg Oral QHS Ajibola, Ene A, NP   80 mg at 04/12/22 2102   buPROPion (WELLBUTRIN XL) 24 hr tablet 300 mg  300 mg Oral Daily Ajibola, Ene A, NP   300 mg at 04/13/22 0940   gabapentin (NEURONTIN) capsule 300 mg  300 mg Oral TID Ajibola, Ene A, NP   300 mg at 04/13/22 0940   hydrOXYzine (ATARAX) tablet 25 mg  25 mg Oral TID PRN Ajibola, Ene A, NP       isosorbide mononitrate (IMDUR) 24 hr tablet 15 mg  15 mg Oral Daily Ajibola, Ene A, NP   15 mg at 04/13/22 0939   magnesium hydroxide (MILK OF MAGNESIA) suspension 30 mL  30 mL Oral Daily PRN Ajibola, Ene A, NP       QUEtiapine (SEROQUEL) tablet 200 mg  200 mg Oral QHS Ajibola, Ene A, NP   200 mg at 04/12/22 2102   traZODone (DESYREL) tablet 50 mg  50 mg Oral QHS PRN Ajibola, Ene A, NP       Current Outpatient Medications  Medication Sig Dispense Refill   buPROPion (WELLBUTRIN XL) 300 MG 24 hr tablet Take 1 tablet (300 mg total) by mouth daily. 30 tablet 1    gabapentin (NEURONTIN) 300 MG capsule Take 1 capsule (300 mg total) by mouth 3 (three) times daily. 90 capsule 1   hydrOXYzine (ATARAX) 25 MG tablet Take 1 tablet (25 mg total) by mouth 3 (three) times daily as needed for anxiety. 90 tablet 1   [START ON 04/14/2022] isosorbide mononitrate (IMDUR) 30 MG 24 hr tablet Take 0.5 tablets (15 mg total) by mouth daily. 15 tablet 1   LIPITOR 80 MG tablet Take 1 tablet (80 mg total) by mouth at bedtime. 30 tablet 1   QUEtiapine (SEROQUEL) 200 MG tablet Take 1 tablet (200 mg total) by mouth at bedtime. 30 tablet 1    PTA Medications: (Not in a hospital admission)      04/12/2022   11:15 AM 03/31/2022    3:32 PM 05/02/2020    3:19 PM  Depression screen PHQ 2/9  Decreased Interest 0 3 1  Down, Depressed, Hopeless _0 PHQ - 2 Score _1 Altered sleeping 0 0 1  Tired, decreased energy 1 0 2  Feeling bad or failure about yourself  _2 Trouble concentrating 0 0   Moving slowly or fidgety/restless 0 0 1  Suicidal thoughts 0 0 2  PHQ-9 Score _3 Difficult doing work/chores Somewhat difficult Somewhat difficult     Flowsheet Row ED from 04/11/2022 in [facility] ED from 03/30/2022 in [facility] ED from 03/29/2022 in [facility]  C-SSRS RISK CATEGORY No Risk No Risk High Risk       Musculoskeletal  Strength & Muscle Tone: within normal limits Gait & Station: normal Patient leans: N/A  Psychiatric Specialty Exam  Presentation  General Appearance: Appropriate for Environment; Casual  Eye Contact:Good  Speech:Clear and Coherent; Normal Rate  Speech Volume:Normal  Handedness:Right   Mood and Affect  Mood:Euthymic  Affect:Congruent   Thought Process  Thought Processes:Coherent  Descriptions of Associations:Intact  Orientation:Full (Time, Place  and Person)  Thought Content:Logical  Diagnosis of Schizophrenia or Schizoaffective  disorder in past: No    Hallucinations:Hallucinations: None  Ideas of Reference:None  Suicidal Thoughts:Suicidal Thoughts: No  Homicidal Thoughts:Homicidal Thoughts: No   Sensorium  Memory:Immediate Good; Recent Good; Remote Good  Judgment:Good  Insight:Good   Executive Functions  Concentration:Good  Attention Span:Good  [place] of Knowledge:Good  Language:Good   Psychomotor Activity  Psychomotor Activity:Psychomotor Activity: Normal   Assets  Assets:Communication Skills; Desire for Improvement; Financial Resources/Insurance; Physical Health; Resilience; Leisure Time   Sleep  Sleep:Sleep: Good   No data recorded  Physical Exam  Physical Exam Vitals and nursing note reviewed.  Constitutional:      General: He is not in acute distress.    Appearance: Normal appearance. He is well-developed.  HENT:     Head: Normocephalic and atraumatic.  Eyes:     General:        Right eye: No discharge.     Conjunctiva/sclera: Conjunctivae normal.  Cardiovascular:     Rate and Rhythm: Normal rate and regular rhythm.     Heart sounds: No murmur heard. Pulmonary:     Effort: Pulmonary effort is normal. No respiratory distress.     Breath sounds: Normal breath sounds.  Abdominal:     Palpations: Abdomen is soft.     Tenderness: There is no abdominal tenderness.  Musculoskeletal:        General: No swelling. Normal range of motion.     Cervical back: Normal range of motion and neck supple.  Skin:    General: Skin is warm and dry.     Capillary Refill: Capillary refill takes more than 3 seconds.     Coloration: Skin is not jaundiced or pale.  Neurological:     Mental Status: He is alert and oriented to person, place, and time.  Psychiatric:        Attention and Perception: Attention and perception normal.        Mood and Affect: Mood normal.        Speech: Speech normal.        Behavior: Behavior normal. Behavior is cooperative.        Thought  Content: Thought content normal.        Cognition and Memory: Cognition normal.        Judgment: Judgment normal.    Review of Systems  Constitutional: Negative.   HENT: Negative.    Eyes: Negative.   Respiratory: Negative.    Cardiovascular: Negative.   Musculoskeletal: Negative.   Skin: Negative.   Neurological: Negative.   Psychiatric/Behavioral: Negative.     Blood pressure 100/69, pulse 90, temperature 98.7 F (37.1 C), temperature source Tympanic, resp. rate 18, SpO2 100 %. There is no height or weight on file to calculate BMI.  Demographic Factors:  Male, Caucasian, Low socioeconomic status, Living alone, and Unemployed  Loss Factors: Financial problems/change in socioeconomic status  Historical Factors: Impulsivity  Risk Reduction Factors:   Positive therapeutic relationship and Positive coping skills or problem solving skills  Continued Clinical Symptoms:  Severe Anxiety and/or Agitation Bipolar Disorder:   Depressive phase Depression:   Comorbid alcohol abuse/dependence Impulsivity Alcohol/Substance Abuse/Dependencies  Cognitive Features That Contribute To Risk:  None    Suicide Risk:  Minimal: No identifiable suicidal ideation.  Patients presenting with no risk factors but with morbid ruminations; may be classified as minimal risk based on the severity of the depressive symptoms  Plan Of Care/Follow-up recommendations:  Activity:  as tolerated  Diet:  Regular   Disposition:   Discharge patient.   Patient is requesting to be discharged in the a.m. and transported to [facility] residential treatment in [facility] _0 :45am. He is adamant that he has a bed waiting for him at the facility.  He will be discharged with 14-day sample and printed prescriptions that include 1 refill of the following medications: Wellbutrin 300 mg 24-hour tablet daily, gabapentin 300 mg 3 times daily, isosorbide mononitrate 15 mg daily, Lipitor 80 mg daily, Seroquel 200 mg nightly, and  hydroxyzine 25 mg 3 times daily as needed.   Substance abuse resources provided on AVS.  Revonda Humphrey, NP 04/13/2022, 3:14 PM

## 2022-04-13 NOTE — ED Notes (Signed)
Patient resting with no sxs of distress noted - will continue to monitor for safety 

## 2022-04-13 NOTE — Progress Notes (Signed)
No change this evening.  Patient is calm, pleasant and upbeat.  He is recovery focused and motivated for treatment.  Will monitor and meet needs as they arise.

## 2022-04-13 NOTE — ED Notes (Signed)
Watching movie with peer

## 2022-04-14 NOTE — ED Notes (Signed)
Patient continues to rest with no sxs of discomfort - will continue to monitor for safety

## 2022-04-14 NOTE — Progress Notes (Signed)
Received Brian [name] in the dinning room, he completed the surveys, received his medication. The taxi was called, he received his prescriptions, medication and AVS. He retrieved his clothes and escorted to the lobby to wait for his taxi.

## 2022-05-06 ENCOUNTER — Ambulatory Visit: Payer: BLUE CROSS/BLUE SHIELD | Admitting: Physician Assistant

## 2022-05-06 ENCOUNTER — Encounter: Payer: Self-pay | Admitting: Physician Assistant

## 2022-05-06 VITALS — BP 128/86 | HR 89 | Resp 18 | Ht 68.0 in | Wt 171.0 lb

## 2022-05-06 DIAGNOSIS — I10 Essential (primary) hypertension: Secondary | ICD-10-CM

## 2022-05-06 DIAGNOSIS — E785 Hyperlipidemia, unspecified: Secondary | ICD-10-CM

## 2022-05-06 DIAGNOSIS — F3181 Bipolar II disorder: Secondary | ICD-10-CM

## 2022-05-06 DIAGNOSIS — F1510 Other stimulant abuse, uncomplicated: Secondary | ICD-10-CM | POA: Diagnosis not present

## 2022-05-06 DIAGNOSIS — F1010 Alcohol abuse, uncomplicated: Secondary | ICD-10-CM

## 2022-05-06 MED ORDER — HYDROXYZINE HCL 25 MG PO TABS: 25.0000 mg | ORAL_TABLET | Freq: Three times a day (TID) | ORAL | 1 refills | Status: DC | PRN

## 2022-05-06 MED ORDER — LIPITOR 80 MG PO TABS: 80.0000 mg | ORAL_TABLET | Freq: Every day | ORAL | 1 refills | Status: DC

## 2022-05-06 MED ORDER — BUPROPION HCL ER (XL) 300 MG PO TB24: 300.0000 mg | ORAL_TABLET | Freq: Every day | ORAL | 1 refills | Status: DC

## 2022-05-06 MED ORDER — ISOSORBIDE MONONITRATE ER 30 MG PO TB24: 15.0000 mg | ORAL_TABLET | Freq: Every day | ORAL | 1 refills | Status: DC

## 2022-05-06 MED ORDER — QUETIAPINE FUMARATE 50 MG PO TABS: ORAL_TABLET | ORAL | 0 refills | Status: DC

## 2022-05-06 MED ORDER — GABAPENTIN 300 MG PO CAPS: ORAL_CAPSULE | ORAL | 0 refills | Status: DC

## 2022-05-06 NOTE — Progress Notes (Unsigned)
New Patient Office Visit  Subjective    Patient ID: COLSEN Brian [name], male    DOB: 1986-02-26  Age: 36 y.o. MRN: [id number]  CC: No chief complaint on file.   HPI Brian [name] presents to establish care [facility] June 19th - [facility] for LTC but cannot use gabapentin and seroquel July 19th discharge   Seroquel for a couple of years Gabapentin - neuropathy - nervedamage in hands  Zyprexa helped  Takes melatonin on top of it      ***  Wellbutrin 300 mg 24-hour tablet daily, gabapentin 300 mg 3 times daily, isosorbide mononitrate 15 mg daily, Lipitor 80 mg daily, Seroquel 200 mg nightly, and hydroxyzine 25 mg 3 times daily as needed. Medications, which were tolerated with no adverse reactions. He was instructed on how to take medications as prescribed.   Outpatient Encounter Medications as of 05/06/2022  Medication Sig   buPROPion (WELLBUTRIN XL) 300 MG 24 hr tablet Take 1 tablet (300 mg total) by mouth daily.   gabapentin (NEURONTIN) 300 MG capsule Take 1 capsule (300 mg total) by mouth 3 (three) times daily.   hydrOXYzine (ATARAX) 25 MG tablet Take 1 tablet (25 mg total) by mouth 3 (three) times daily as needed for anxiety.   isosorbide mononitrate (IMDUR) 30 MG 24 hr tablet Take 0.5 tablets (15 mg total) by mouth daily.   LIPITOR 80 MG tablet Take 1 tablet (80 mg total) by mouth at bedtime.   QUEtiapine (SEROQUEL) 200 MG tablet Take 1 tablet (200 mg total) by mouth at bedtime.   [DISCONTINUED] FLUoxetine (PROZAC) 20 MG capsule Take 1 capsule (20 mg total) by mouth daily. For depression (Patient not taking: Reported on 11/04/2019)   [DISCONTINUED] sucralfate (CARAFATE) 1 g tablet Take 1 tablet (1 g total) by mouth 4 (four) times daily -  with meals and at bedtime. (Patient not taking: Reported on 11/04/2019)   No facility-administered encounter medications on file as of 05/06/2022.    Past Medical History:  Diagnosis Date   Alcohol abuse    Anxiety    Bipolar 2  disorder ([place])    Cocaine abuse ([place])    Depression    Kidney stones    Methamphetamine abuse, episodic ([facility])    Myocardial infarction ([place])    Narcotic abuse ([place])    Peptic ulcer    Polysubstance abuse ([place])    Renal disorder    kidney stones    Past Surgical History:  Procedure Laterality Date   ESOPHAGOGASTRODUODENOSCOPY N/A 05/29/2014   Procedure: ESOPHAGOGASTRODUODENOSCOPY (EGD) ;  Surgeon: Beryle Beams, MD;  Location: [facility] ENDOSCOPY;  Service: Endoscopy;  Laterality: N/A;  check with Dr. Collene Mares about sedation type/timinng - I recommend MAC   HAND SURGERY  2006   LEFT HEART CATH AND CORONARY ANGIOGRAPHY N/A 06/21/2020   Procedure: LEFT HEART CATH AND CORONARY ANGIOGRAPHY;  Surgeon: Nelva Bush, MD;  Location: [facility];  Service: Cardiovascular;  Laterality: N/A;    Family History  Problem Relation Age of Onset   Heart disease Father     Social History   Socioeconomic History   Marital status: Single    Spouse name: Not on file   Number of children: Not on file   Years of education: Not on file   Highest education level: Not on file  Occupational History   Not on file  Tobacco Use   Smoking status: Every Day    Packs/day: 0.50    Types: Cigarettes  Smokeless tobacco: Never  Vaping Use   Vaping Use: Never used  Substance and Sexual Activity   Alcohol use: Not Currently   Drug use: Yes    Types: Marijuana, Cocaine, IV, Methamphetamines    Comment: "meth yesterday morning" per pt   Sexual activity: Not Currently  Other Topics Concern   Not on file  Social History Narrative   ** Merged History Encounter **       Lives with a friend   Lost house and custody of son 9broke up with fiancee)   Nature conservation officer   Social Determinants of Radio broadcast assistant Strain: Not on file  Food Insecurity: Not on file  Transportation Needs: Not on file  Physical Activity: Not on file  Stress: Not on file  Social Connections: Not on file  Intimate  Partner Violence: Not on file    ROS      Objective    There were no vitals taken for this visit.  Physical Exam  {Labs (Optional):23779}    Assessment & Plan:   Problem List Items Addressed This Visit   None   No follow-ups on file.   [name], PA-C

## 2022-05-06 NOTE — Patient Instructions (Signed)
You are going to taper gabapentin and Seroquel.  You will take gabapentin twice a day for 1 week then you will take it once a day at bedtime for 1 week and then discontinue.  You will take Seroquel 100 mg at bedtime for 1 week, then take 50 mg at bedtime for 1 week and then discontinue.  You will follow-up with the mobile unit for further evaluation in 2 to 3 weeks.  Kennieth Rad, PA-C Physician Assistant [facility] [email or web address]   Health Maintenance, Male Adopting a healthy lifestyle and getting preventive care are important in promoting health and wellness. Ask your health care provider about: The right schedule for you to have regular tests and exams. Things you can do on your own to prevent diseases and keep yourself healthy. What should I know about diet, weight, and exercise? Eat a healthy diet  Eat a diet that includes plenty of vegetables, fruits, low-fat dairy products, and lean protein. Do not eat a lot of foods that are high in solid fats, added sugars, or sodium. Maintain a healthy weight Body mass index (BMI) is a measurement that can be used to identify possible weight problems. It estimates body fat based on height and weight. Your health care provider can help determine your BMI and help you achieve or maintain a healthy weight. Get regular exercise Get regular exercise. This is one of the most important things you can do for your health. Most adults should: Exercise for at least 150 minutes each week. The exercise should increase your heart rate and make you sweat (moderate-intensity exercise). Do strengthening exercises at least twice a week. This is in addition to the moderate-intensity exercise. Spend less time sitting. Even light physical activity can be beneficial. Watch cholesterol and blood lipids Have your blood tested for lipids and cholesterol at 38 years of age, then have this test every 5  years. You may need to have your cholesterol levels checked more often if: Your lipid or cholesterol levels are high. You are older than 38 years of age. You are at high risk for heart disease. What should I know about cancer screening? Many types of cancers can be detected early and may often be prevented. Depending on your health history and family history, you may need to have cancer screening at various ages. This may include screening for: Colorectal cancer. Prostate cancer. Skin cancer. Lung cancer. What should I know about heart disease, diabetes, and high blood pressure? Blood pressure and heart disease High blood pressure causes heart disease and increases the risk of stroke. This is more likely to develop in people who have high blood pressure readings or are overweight. Talk with your health care provider about your target blood pressure readings. Have your blood pressure checked: Every 3-5 years if you are 57-37 years of age. Every year if you are 4 years old or older. If you are between the ages of 95 and 18 and are a current or former smoker, ask your health care provider if you should have a one-time screening for abdominal aortic aneurysm (AAA). Diabetes Have regular diabetes screenings. This checks your fasting blood sugar level. Have the screening done: Once every three years after age 52 if you are at a normal weight and have a low risk for diabetes. More often and at a younger age if you are overweight or have a high risk for diabetes. What should I know about preventing infection? Hepatitis B If you have a higher  risk for hepatitis B, you should be screened for this virus. Talk with your health care provider to find out if you are at risk for hepatitis B infection. Hepatitis C Blood testing is recommended for: Everyone born from [birth year] through 1967. Anyone with known risk factors for hepatitis C. Sexually transmitted infections (STIs) You should be screened each  year for STIs, including gonorrhea and chlamydia, if: You are sexually active and are younger than 38 years of age. You are older than 38 years of age and your health care provider tells you that you are at risk for this type of infection. Your sexual activity has changed since you were last screened, and you are at increased risk for chlamydia or gonorrhea. Ask your health care provider if you are at risk. Ask your health care provider about whether you are at high risk for HIV. Your health care provider may recommend a prescription medicine to help prevent HIV infection. If you choose to take medicine to prevent HIV, you should first get tested for HIV. You should then be tested every 3 months for as long as you are taking the medicine. Follow these instructions at home: Alcohol use Do not drink alcohol if your health care provider tells you not to drink. If you drink alcohol: Limit how much you have to 0-2 drinks a day. Know how much alcohol is in your drink. In the [place] one drink equals one 12 oz bottle of beer (355 mL), one 5 oz glass of wine (148 mL), or one 1 oz glass of hard liquor (44 mL). Lifestyle Do not use any products that contain nicotine or tobacco. These products include cigarettes, chewing tobacco, and vaping devices, such as e-cigarettes. If you need help quitting, ask your health care provider. Do not use street drugs. Do not share needles. Ask your health care provider for help if you need support or information about quitting drugs. General instructions Schedule regular health, dental, and eye exams. Stay current with your vaccines. Tell your health care provider if: You often feel depressed. You have ever been abused or do not feel safe at home. Summary Adopting a healthy lifestyle and getting preventive care are important in promoting health and wellness. Follow your health care provider's instructions about healthy diet, exercising, and getting tested or screened  for diseases. Follow your health care provider's instructions on monitoring your cholesterol and blood pressure. This information is not intended to replace advice given to you by your health care provider. Make sure you discuss any questions you have with your health care provider. Document Revised: 03/04/2021 Document Reviewed: 03/04/2021 Elsevier Patient Education  [place].

## 2022-05-07 ENCOUNTER — Encounter: Payer: Self-pay | Admitting: Physician Assistant

## 2022-06-29 ENCOUNTER — Emergency Department (HOSPITAL_COMMUNITY)
Admission: EM | Admit: 2022-06-29 | Discharge: 2022-06-29 | Disposition: A | Discharge status: Home / Self Care | Payer: BLUE CROSS/BLUE SHIELD | Attending: Emergency Medicine | Admitting: Emergency Medicine

## 2022-06-29 ENCOUNTER — Other Ambulatory Visit: Payer: Self-pay

## 2022-06-29 ENCOUNTER — Encounter (HOSPITAL_COMMUNITY): Payer: Self-pay

## 2022-06-29 DIAGNOSIS — F319 Bipolar disorder, unspecified: Secondary | ICD-10-CM | POA: Diagnosis not present

## 2022-06-29 DIAGNOSIS — F1910 Other psychoactive substance abuse, uncomplicated: Secondary | ICD-10-CM | POA: Diagnosis not present

## 2022-06-29 NOTE — Discharge Instructions (Signed)
Today you were seen in the emergency department for your substance use.    When you leave, please refrain from using any recreational substances and take your medications as prescribed.    Follow-up with your primary doctor in 2-3 days regarding your visit.  Please go to the behavioral health urgent care for additional treatment.  Return immediately to the emergency department if you experience any of the following: Muscle rigidity, palpitations, seizures, high fever, or any other concerning symptoms.    Thank you for visiting our Emergency Department. It was a pleasure taking care of you today.

## 2022-06-29 NOTE — ED Provider Notes (Signed)
[facility] DEPT Provider Note   CSN: [id number] Arrival date & time: 06/29/22  1948     History  Chief Complaint  Patient presents with   Drug Problem    Brian [name] is a 36 y.o. male.  36 year old male with a history of polysubstance abuse and bipolar presents emergency department with "drug problems".  Patient states that for the past few weeks he has been taking more of his gabapentin and bupropion than prescribed in order to get high.  States that he was prescribed 300 mg daily of Wellbutrin XR as well as 300 of gabapentin 3 times daily.  Patient states that for the past few weeks he has been occasionally taking higher doses up to 10 of his Wellbutrin a day and 25 of his gabapentin a day to get high.  He occasionally snorts these.  Says that today he took 2 of his gabapentin and 6 of the Wellbutrin.  Denies any palpitations, seizures, muscle cramps or aches.  Says that he is not attempting to harm himself or others.  No hallucinations at this time.  Says that [facility] kicked him out due to his substance use and so he wanted to come here for additional resources.   Drug Problem       Home Medications Prior to Admission medications   Medication Sig Start Date End Date Taking? Authorizing Provider  buPROPion (WELLBUTRIN XL) 300 MG 24 hr tablet Take 1 tablet (300 mg total) by mouth daily. 05/06/22   Mayers, Cari S, PA-C  gabapentin (NEURONTIN) 300 MG capsule Take 1 cap PO BID for one week, then take 1 cap PO qhs for one week, then d/c 05/06/22   Mayers, Cari S, PA-C  hydrOXYzine (ATARAX) 25 MG tablet Take 1 tablet (25 mg total) by mouth 3 (three) times daily as needed for anxiety. 05/06/22   Mayers, Cari S, PA-C  isosorbide mononitrate (IMDUR) 30 MG 24 hr tablet Take 0.5 tablets (15 mg total) by mouth daily. 05/06/22   Mayers, Cari S, PA-C  LIPITOR 80 MG tablet Take 1 tablet (80 mg total) by mouth at bedtime. 05/06/22   Mayers, Cari S, PA-C   QUEtiapine (SEROQUEL) 50 MG tablet Take 2 tabs PO qhs for one week , then take 1 tab PO qhs for one week, then D/C 05/06/22   Mayers, Cari S, PA-C  FLUoxetine (PROZAC) 20 MG capsule Take 1 capsule (20 mg total) by mouth daily. For depression Patient not taking: Reported on 11/04/2019 04/01/18 05/04/20  Lindell Spar I, NP  sucralfate (CARAFATE) 1 g tablet Take 1 tablet (1 g total) by mouth 4 (four) times daily -  with meals and at bedtime. Patient not taking: Reported on 11/04/2019 08/30/19 05/04/20  Lorayne Bender, PA-C      Allergies    Fish allergy and Shellfish allergy    Review of Systems   Review of Systems  Physical Exam Updated Vital Signs BP (!) 154/100   Pulse 100   Temp 98.6 F (37 C) (Oral)   Resp 15   Ht 5\' 8"  (1.727 m)   Wt 78 kg   SpO2 98%   BMI 26.15 kg/m  Physical Exam Vitals and nursing note reviewed.  Constitutional:      General: He is not in acute distress.    Appearance: He is well-developed.  HENT:     Head: Normocephalic and atraumatic.     Right Ear: External ear normal.     Left Ear:  External ear normal.     Nose: Nose normal.  Eyes:     Extraocular Movements: Extraocular movements intact.     Conjunctiva/sclera: Conjunctivae normal.     Pupils: Pupils are equal, round, and reactive to light.  Cardiovascular:     Rate and Rhythm: Normal rate and regular rhythm.     Heart sounds: Normal heart sounds.  Pulmonary:     Effort: Pulmonary effort is normal. No respiratory distress.     Breath sounds: Normal breath sounds.  Abdominal:     General: There is no distension.     Palpations: Abdomen is soft. There is no mass.     Tenderness: There is no abdominal tenderness. There is no guarding.  Musculoskeletal:        General: No swelling.     Cervical back: Normal range of motion and neck supple.     Right lower leg: No edema.     Left lower leg: No edema.     Comments: No muscle rigidity.  No hyperreflexia or clonus.  Skin:    General: Skin is warm and  dry.     Capillary Refill: Capillary refill takes less than 2 seconds.  Neurological:     Mental Status: He is alert. Mental status is at baseline.  Psychiatric:     Comments: Somewhat anxious and fidgety     ED Results / Procedures / Treatments   Labs (all labs ordered are listed, but only abnormal results are displayed) Labs Reviewed - No data to display  EKG EKG Interpretation  Date/Time:  Sunday June 29 2022 20:14:59 EDT Ventricular Rate:  100 PR Interval:  144 QRS Duration: 83 QT Interval:  326 QTC Calculation: 421 R Axis:   93 Text Interpretation: Sinus tachycardia Borderline right axis deviation ST elev, probable normal early repol pattern Confirmed by Margaretmary Eddy [phone]) on 06/29/2022 8:21:47 PM  Radiology No results found.  Procedures Procedures   Medications Ordered in ED Medications - No data to display  ED Course/ Medical Decision Making/ A&P                           Medical Decision Making  Brian [name] is a 35 y.o. male with history of polysubstance abuse and bipolar who presents with chief complaint of drug problems.  Initial Ddx:  Gabapentin and Wellbutrin overdose, methamphetamine abuse, mania, SI/HI  MDM:  Given the patient's excessive use of gabapentin and Wellbutrin did consider toxicity from the substances.  However, does not appear to be having the depression that would be expected from gabapentin overdose.  No palpitations or seizures that would be expected from Wellbutrin either.  EKG will be obtained to assess for prolonged QRS given his use of this medication.  Also considered methamphetamine abuse given his somewhat anxious affect but feel this is less likely.  Patient is able to provide coherent history and she will likely be able to care for himself if he is discharged so do not feel that he requires admission for a manic episode at this time.  Denies any SI or HI or AVH that would indicate his motion as well.  Plan:   EKG  ED Summary:  Patient was given referral to behavioral health urgent care.  Informed him that he should take his medications as prescribed and refrain from other substance use.  Dispo: DC Home. Return precautions discussed including, but not limited to, those listed in the AVS. Allowed pt  time to ask questions which were answered fully prior to dc.   Records reviewed OP Notes  Final Clinical Impression(s) / ED Diagnoses Final diagnoses:  Substance abuse [facility])    Rx / DC Orders ED Discharge Orders     None         Fransico Meadow, MD 06/29/22 2029

## 2022-06-29 NOTE — ED Triage Notes (Addendum)
Patient said he has been snorting Wellbutrin. He said he wants help, and was taking it the "right way for 97 days." He said the "right way is to take it by mouth." He said he has been taking more gabapentin and meth. He lives in an [place].

## 2022-07-03 ENCOUNTER — Other Ambulatory Visit: Payer: Self-pay

## 2022-07-03 ENCOUNTER — Emergency Department (HOSPITAL_COMMUNITY)
Admission: EM | Admit: 2022-07-03 | Discharge: 2022-07-03 | Disposition: A | Discharge status: Home / Self Care | Payer: BLUE CROSS/BLUE SHIELD | Attending: Emergency Medicine | Admitting: Emergency Medicine

## 2022-07-03 ENCOUNTER — Encounter (HOSPITAL_COMMUNITY): Payer: Self-pay

## 2022-07-03 DIAGNOSIS — F32A Depression, unspecified: Secondary | ICD-10-CM | POA: Insufficient documentation

## 2022-07-03 DIAGNOSIS — Z79899 Other long term (current) drug therapy: Secondary | ICD-10-CM | POA: Diagnosis not present

## 2022-07-03 DIAGNOSIS — F3181 Bipolar II disorder: Secondary | ICD-10-CM | POA: Diagnosis present

## 2022-07-03 DIAGNOSIS — F1510 Other stimulant abuse, uncomplicated: Secondary | ICD-10-CM | POA: Diagnosis not present

## 2022-07-03 DIAGNOSIS — Z20822 Contact with and (suspected) exposure to covid-19: Secondary | ICD-10-CM | POA: Insufficient documentation

## 2022-07-03 DIAGNOSIS — F1590 Other stimulant use, unspecified, uncomplicated: Secondary | ICD-10-CM | POA: Diagnosis present

## 2022-07-03 DIAGNOSIS — F1910 Other psychoactive substance abuse, uncomplicated: Secondary | ICD-10-CM | POA: Insufficient documentation

## 2022-07-03 DIAGNOSIS — Z765 Malingerer [conscious simulation]: Secondary | ICD-10-CM

## 2022-07-03 LAB — RESP PANEL BY RT-PCR (FLU A&B, COVID) ARPGX2
Influenza A by PCR: NEGATIVE
Influenza B by PCR: NEGATIVE
SARS Coronavirus 2 by RT PCR: NEGATIVE

## 2022-07-03 LAB — RAPID URINE DRUG SCREEN, HOSP PERFORMED
Amphetamines: POSITIVE — AB
Barbiturates: NOT DETECTED
Benzodiazepines: NOT DETECTED
Cocaine: POSITIVE — AB
Opiates: NOT DETECTED
Tetrahydrocannabinol: NOT DETECTED

## 2022-07-03 LAB — ETHANOL: Alcohol, Ethyl (B): <10 mg/dL (ref <10)

## 2022-07-03 MED ORDER — BACITRACIN ZINC 500 UNIT/GM EX OINT
TOPICAL_OINTMENT | Freq: Two times a day (BID) | CUTANEOUS | Status: DC
  Filled 2022-07-03: qty 0.9

## 2022-07-03 NOTE — ED Provider Notes (Addendum)
[facility] DEPT Provider Note   CSN: [id number] Arrival date & time: 07/03/22  1452     History  Chief Complaint  Patient presents with   Addiction Problem    Brian [name] is a 36 y.o. male.  Patient is a 36 year old male presenting for psychiatric consult and substance abuse.  Patient presented on 06/29/2022 to [facility] emergency department stating that he was having a hard time coping with the loss of his grandmother and was abusing his Wellbutrin and gabapentin.  States he felt like it was not working for him so he was taking more than prescribed.  Patient states since then he "got rid of them" that he would not harm himself.  Patient states soon after this Monday he started using "ICE" (crystal meth) and has been using for the past 4 days. States in the past when he used meth he was well controlled but he feels out of control during this relapse.  Last meth use was this morning.  Requesting new pysch meds and psychiatric eval. 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  NO SI/HI.   The history is provided by the patient. No language interpreter was used.       Home Medications Prior to Admission medications   Medication Sig Start Date End Date Taking? Authorizing Provider  buPROPion (WELLBUTRIN XL) 300 MG 24 hr tablet Take 1 tablet (300 mg total) by mouth daily. 05/06/22   Mayers, Cari S, PA-C  gabapentin (NEURONTIN) 300 MG capsule Take 1 cap PO BID for one week, then take 1 cap PO qhs for one week, then d/c 05/06/22   Mayers, Cari S, PA-C  hydrOXYzine (ATARAX) 25 MG tablet Take 1 tablet (25 mg total) by mouth 3 (three) times daily as needed for anxiety. 05/06/22   Mayers, Cari S, PA-C  isosorbide mononitrate (IMDUR) 30 MG 24 hr tablet Take 0.5 tablets (15 mg total) by mouth daily. 05/06/22   Mayers, Cari S, PA-C  LIPITOR 80 MG tablet Take 1 tablet (80 mg total) by mouth at bedtime.  05/06/22   Mayers, Cari S, PA-C  QUEtiapine (SEROQUEL) 50 MG tablet Take 2 tabs PO qhs for one week , then take 1 tab PO qhs for one week, then D/C 05/06/22   Mayers, Cari S, PA-C  FLUoxetine (PROZAC) 20 MG capsule Take 1 capsule (20 mg total) by mouth daily. For depression Patient not taking: Reported on 11/04/2019 04/01/18 05/04/20  Lindell Spar I, NP  sucralfate (CARAFATE) 1 g tablet Take 1 tablet (1 g total) by mouth 4 (four) times daily -  with meals and at bedtime. Patient not taking: Reported on 11/04/2019 08/30/19 05/04/20  Lorayne Bender, PA-C      Allergies    Fish allergy and Shellfish allergy    Review of Systems   Review of Systems  Constitutional:  Negative for chills and fever.  HENT:  Negative for ear pain and sore throat.   Eyes:  Negative for pain and visual disturbance.  Respiratory:  Negative for cough and shortness of breath.   Cardiovascular:  Negative for chest pain and palpitations.  Gastrointestinal:  Negative for abdominal pain and vomiting.  Genitourinary:  Negative for dysuria and hematuria.  Musculoskeletal:  Negative for arthralgias and back pain.  Skin:  Negative for color change and rash.  Neurological:  Negative for seizures and syncope.  All other systems reviewed and are negative.   Physical Exam Updated Vital Signs  BP (!) 140/110 (BP Location: Left Arm)   Pulse 97   Temp 98 F (36.7 C) (Oral)   Resp 16   Ht 5\' 8"  (1.727 m)   Wt 77.1 kg   BMI 25.85 kg/m  Physical Exam Vitals and nursing note reviewed.  Constitutional:      General: He is not in acute distress.    Appearance: He is well-developed.  HENT:     Head: Normocephalic and atraumatic.   Eyes:     Conjunctiva/sclera: Conjunctivae normal.  Cardiovascular:     Rate and Rhythm: Normal rate and regular rhythm.     Heart sounds: No murmur heard. Pulmonary:     Effort: Pulmonary effort is normal. No respiratory distress.     Breath sounds: Normal breath sounds.  Abdominal:     Palpations:  Abdomen is soft.     Tenderness: There is no abdominal tenderness.  Musculoskeletal:        General: No swelling.     Cervical back: Neck supple.  Skin:    General: Skin is warm and dry.     Capillary Refill: Capillary refill takes less than 2 seconds.  Neurological:     Mental Status: He is alert.  Psychiatric:        Mood and Affect: Mood normal.     ED Results / Procedures / Treatments   Labs (all labs ordered are listed, but only abnormal results are displayed) Labs Reviewed  RAPID URINE DRUG SCREEN, HOSP PERFORMED - Abnormal; Notable for the following components:      Result Value   Cocaine POSITIVE (*)    Amphetamines POSITIVE (*)    All other components within normal limits  RESP PANEL BY RT-PCR (FLU A&B, COVID) ARPGX2  ETHANOL    EKG None  Radiology No results found.  Procedures Procedures    Medications Ordered in ED Medications  bacitracin ointment (has no administration in time range)    ED Course/ Medical Decision Making/ A&P                           Medical Decision Making Amount and/or Complexity of Data Reviewed Labs: ordered.  Risk OTC drugs.   5:56 PM Patient is a 36 year old male presenting for psychiatric consult and substance abuse.  Is alert and oriented x3, no acute distress, afebrile, stable vital signs.  Physical exam demonstrates excoriations on the face.  Bacitracin ordered.    Medically cleared at this standpoint and safe to be evaluated by [facility] physician.  Remaining patient's UDS will be positive for amphetamines.  Pending psych eval for med recommendations. Otherwise no SI/HI. Pt is not interested in current rehab at this time. Stable for DC home with drug rehab recs.   7:10 PM Evaluated by [facility] and recommended for outpatient follow-up with his established psychiatrist. Patient in no distress and overall condition improved here in the ED. Detailed discussions were had with the patient regarding current findings, and need for  close f/u with PCP or on call doctor. The patient has been instructed to return immediately if the symptoms worsen in any way for re-evaluation. Patient verbalized understanding and is in agreement with current care plan. All questions answered prior to discharge.       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

## 2022-07-03 NOTE — Discharge Summary (Signed)
[facility] Psych ED Discharge  07/03/2022 5:52 PM Brian [name]  MRN:  [id number]  Principal Problem: Methamphetamine use [facility]) Discharge Diagnoses: Principal Problem:   Methamphetamine use (Brian [name]) Active Problems:   Malingering   Bipolar II disorder ([place])  Clinical Impression:  Final diagnoses:  Methamphetamine use ([place])   Subjective: He is a 36 year old male with a history of bipolar 2 disorder, methamphetamine use, and malingering who presents to [facility] ED requesting assistance with substance abuse treatment.  Patient evaluated and case discussed with Dr. Hampton Abbot.  Patient reports that he has been abusing his Wellbutrin and gabapentin.  He states that the last 4 days he has been using methamphetamines.  Patient states that prior to this he was sober for 11 months.  He states a trigger for his relapse is the anniversary of his mother's death.  He states that he "eats" his methamphetamine.  Denies IV drug use.  Patient reports that he was kicked out of the [place] house due to his drug use.  Patient denies use of marijuana, alcohol, and other substances.  He does report a history of cocaine use.  Patient denies suicidal ideations.  He denies homicidal ideations.  He denies auditory and visual hallucinations.  No indication that he is responding to internal stimuli.  On evaluation, patient is alert and oriented x 4.  He is slightly irritable, but cooperative.  His speech is clear and coherent.  Eye contact is good.  He is neatly groomed.  He reports his mood as depressed and anxious.  Affect is congruent with mood.  Thought process is coherent.  Thought content is logical.  Patient denies auditory and visual hallucinations.  He denies paranoia.  No delusions elicited during this assessment.  Patient denies suicidal ideations.  Patient denies homicidal ideations.  Patient reports that he is followed by Brian Gutter, NP [facility] [place] [facility] needs.  Patient  request additional substance abuse treatment resources.  ED Assessment Time Calculation: Start Time: 1630 Stop Time: 1650 Total Time in Minutes (Assessment Completion): 20    Past Medical History:  Past Medical History:  Diagnosis Date   Alcohol abuse    Anxiety    Bipolar 2 disorder ([place])    Cocaine abuse ([place])    Depression    Kidney stones    Methamphetamine abuse, episodic (HCC)    Myocardial infarction ([place])    Narcotic abuse ([place])    Peptic ulcer    Polysubstance abuse ([place])    Renal disorder    kidney stones    Past Surgical History:  Procedure Laterality Date   ESOPHAGOGASTRODUODENOSCOPY N/A 05/29/2014   Procedure: ESOPHAGOGASTRODUODENOSCOPY (EGD) ;  Surgeon: Beryle Beams, MD;  Location: [facility] ENDOSCOPY;  Service: Endoscopy;  Laterality: N/A;  check with Dr. Collene Mares about sedation type/timinng - I recommend MAC   HAND SURGERY  2006   LEFT HEART CATH AND CORONARY ANGIOGRAPHY N/A 06/21/2020   Procedure: LEFT HEART CATH AND CORONARY ANGIOGRAPHY;  Surgeon: Nelva Bush, MD;  Location: [place] CV LAB;  Service: Cardiovascular;  Laterality: N/A;   Family History:  Family History  Problem Relation Age of Onset   Heart disease Father     Social History:  Social History   Substance and Sexual Activity  Alcohol Use Not Currently     Social History   Substance and Sexual Activity  Drug Use Yes   Types: IV, Methamphetamines    Social History   Socioeconomic History   Marital  status: Single    Spouse name: Not on file   Number of children: Not on file   Years of education: Not on file   Highest education level: Not on file  Occupational History   Not on file  Tobacco Use   Smoking status: Every Day    Packs/day: 0.50    Types: Cigarettes   Smokeless tobacco: Never  Vaping Use   Vaping Use: Never used  Substance and Sexual Activity   Alcohol use: Not Currently   Drug use: Yes    Types: IV, Methamphetamines   Sexual activity: Not Currently  Other  Topics Concern   Not on file  Social History Narrative   ** Merged History Encounter **       Lives with a friend   Lost house and custody of son 9broke up with fiancee)   Nature conservation officer   Social Determinants of Radio broadcast assistant Strain: Not on file  Food Insecurity: Not on file  Transportation Needs: Not on file  Physical Activity: Not on file  Stress: Not on file  Social Connections: Not on file    Tobacco Cessation:  A prescription for an FDA-approved tobacco cessation medication was offered at discharge and the patient refused  Current Medications: Current Facility-Administered Medications  Medication Dose Route Frequency Provider Last Rate Last Admin   bacitracin ointment   Topical BID Lianne Cure, DO       Current Outpatient Medications  Medication Sig Dispense Refill   buPROPion (WELLBUTRIN XL) 300 MG 24 hr tablet Take 1 tablet (300 mg total) by mouth daily. 30 tablet 1   gabapentin (NEURONTIN) 300 MG capsule Take 1 cap PO BID for one week, then take 1 cap PO qhs for one week, then d/c 21 capsule 0   hydrOXYzine (ATARAX) 25 MG tablet Take 1 tablet (25 mg total) by mouth 3 (three) times daily as needed for anxiety. 90 tablet 1   isosorbide mononitrate (IMDUR) 30 MG 24 hr tablet Take 0.5 tablets (15 mg total) by mouth daily. 15 tablet 1   LIPITOR 80 MG tablet Take 1 tablet (80 mg total) by mouth at bedtime. 30 tablet 1   QUEtiapine (SEROQUEL) 50 MG tablet Take 2 tabs PO qhs for one week , then take 1 tab PO qhs for one week, then D/C 21 tablet 0   PTA Medications: (Not in a hospital admission)   [place] Scale:  [place] ED from 07/03/2022 in [place] DEPT ED from 06/29/2022 in [place] DEPT ED from 04/11/2022 in [place] No Risk No Risk No Risk       Musculoskeletal: Strength & Muscle Tone: within normal limits Gait & Station:  normal Patient leans: N/A  Psychiatric Specialty Exam: Presentation  General Appearance: Appropriate for Environment; Casual  Eye Contact:Good  Speech:Clear and Coherent; Normal Rate  Speech Volume:Normal  Handedness:Right   Mood and Affect  Mood:Depressed; Anxious  Affect:Congruent   Thought Process  Thought Processes:Coherent; Goal Directed; Linear  Descriptions of Associations:Intact  Orientation:Full (Time, Place and Person)  Thought Content:Logical  History of Schizophrenia/Schizoaffective disorder:No  Duration of Psychotic Symptoms:No data recorded Hallucinations:Hallucinations: None  Ideas of Reference:None  Suicidal Thoughts:Suicidal Thoughts: No  Homicidal Thoughts:Homicidal Thoughts: No   Sensorium  Memory:Immediate Good; Recent Good  Judgment:Fair  Insight:Fair   Executive Functions  Concentration:Fair  Attention Span:Fair  Recall:Good  Fund of Knowledge:Good  Language:Good   Psychomotor Activity  Psychomotor Activity:Psychomotor Activity: Normal   Assets  Assets:Communication Skills; Desire for Improvement; Financial Resources/Insurance; Physical Health   Sleep  Sleep:Sleep: Fair    Physical Exam: Physical Exam Constitutional:      General: He is not in acute distress.    Appearance: He is not ill-appearing, toxic-appearing or diaphoretic.  HENT:     Right Ear: External ear normal.     Left Ear: External ear normal.  Eyes:     General:        Right eye: No discharge.        Left eye: No discharge.  Cardiovascular:     Rate and Rhythm: Normal rate.  Pulmonary:     Effort: Pulmonary effort is normal. No respiratory distress.  Musculoskeletal:        General: Normal range of motion.  Neurological:     Mental Status: He is alert and oriented to person, place, and time.  Psychiatric:        Mood and Affect: Mood is anxious and depressed.        Thought Content: Thought content is not paranoid or delusional.  Thought content does not include homicidal or suicidal ideation.    Review of Systems  Constitutional:  Negative for chills, diaphoresis, fever, malaise/fatigue and weight loss.  Cardiovascular:  Negative for chest pain and palpitations.  Gastrointestinal:  Negative for diarrhea, nausea and vomiting.  Neurological:  Negative for dizziness and seizures.  Psychiatric/Behavioral:  Positive for depression and substance abuse. Negative for hallucinations, memory loss and suicidal ideas. The patient is nervous/anxious and has insomnia.    Blood pressure (!) 140/110, pulse 97, temperature 98 F (36.7 C), temperature source Oral, resp. rate 16, height _0  (1.727 m), weight 77.1 kg. Body mass index is 25.85 kg/m.   Demographic Factors:  Male and Low socioeconomic status  Loss Factors: Loss of significant relationship and Financial problems/change in socioeconomic status  Historical Factors: Family history of mental illness or substance abuse  Risk Reduction Factors:   Sense of responsibility to family, Religious beliefs about death, and Positive social support  Continued Clinical Symptoms:  Alcohol/Substance Abuse/Dependencies Previous Psychiatric Diagnoses and Treatments  Cognitive Features That Contribute To Risk:  Closed-mindedness    Suicide Risk:  Minimal: No identifiable suicidal ideation.  Patients presenting with no risk factors but with morbid ruminations; may be classified as minimal risk based on the severity of the depressive symptoms   Follow-up Information     Placey, Audrea Muscat, NP. Schedule an appointment as soon as possible for a visit.   Contact information: [place] [address] [phone]         [facility] Follow up.   Specialty: Urgent Care Why: As needed, If symptoms worsen  Behavioral [facility] Urgent Care is open 24 hours/7 days a week for urgent mental health needs Contact information: [place] [phone]                Medical Decision Making: Patient is seeking substance abuse treatment due to use of methamphetamines.   Problem 1: Methamphetamine abuse   Disposition: No evidence of imminent risk to self or others at present.   Patient does not meet criteria for psychiatric inpatient admission. Discussed crisis plan, support from social network, calling 911, coming to the Emergency Department, and calling Suicide Hotline.    Discharge Instructions       Discharge recommendations:  Patient is to take medications  as prescribed. Please see information for follow-up appointment with psychiatry and therapy. Please follow up with your primary care provider for all medical related needs.   Therapy: We recommend that patient participate in individual therapy to address mental health concerns.  Medications: The patient is to contact a medical professional and/or outpatient provider to address any new side effects that develop. Patient should update outpatient providers of any new medications and/or medication changes.   Atypical antipsychotics: If you are prescribed an atypical antipsychotic, it is recommended that your height, weight, BMI, blood pressure, fasting lipid panel, and fasting blood sugar be monitored by your outpatient providers.  Safety:  The patient should abstain from use of illicit substances/drugs and abuse of any medications. If symptoms worsen or do not continue to improve or if the patient becomes actively suicidal or homicidal then it is recommended that the patient return to the closest hospital emergency department, the [facility], or call 911 for further evaluation and treatment. National Suicide Prevention Lifeline 1-800-SUICIDE or 202-769-4604.  About 988 988 offers 24/7 access to trained crisis counselors who can help people experiencing mental health-related distress.  People can call or text 988 or chat 988lifeline.org for themselves or if they are worried about a loved one who may need crisis support.  Crisis Mobile: [facility]:                     (616) 568-0493 (for crisis response 24 hours a day) [place]:                                            [phone]        Rozetta Nunnery, NP 07/03/2022, 5:52 PM

## 2022-07-03 NOTE — Discharge Instructions (Signed)
  Discharge recommendations:  Patient is to take medications as prescribed. Please see information for follow-up appointment with psychiatry and therapy. Please follow up with your primary care provider for all medical related needs.   Therapy: We recommend that patient participate in individual therapy to address mental health concerns.  Medications: The patient is to contact a medical professional and/or outpatient provider to address any new side effects that develop. Patient should update outpatient providers of any new medications and/or medication changes.   Atypical antipsychotics: If you are prescribed an atypical antipsychotic, it is recommended that your height, weight, BMI, blood pressure, fasting lipid panel, and fasting blood sugar be monitored by your outpatient providers.  Safety:  The patient should abstain from use of illicit substances/drugs and abuse of any medications. If symptoms worsen or do not continue to improve or if the patient becomes actively suicidal or homicidal then it is recommended that the patient return to the closest hospital emergency department, the [facility], or call 911 for further evaluation and treatment. National Suicide Prevention Lifeline 1-800-SUICIDE or 1-800-273-8255.  About 988 988 offers 24/7 access to trained crisis counselors who can help people experiencing mental health-related distress. People can call or text 988 or chat 988lifeline.org for themselves or if they are worried about a loved one who may need crisis support.  Crisis Mobile: [facility]:                     1-877-626-1772 (for crisis response 24 hours a day) [place] Hotline:                                            1-800-256-2452  

## 2022-07-03 NOTE — ED Triage Notes (Addendum)
Patient states he "ate meth approx 2 hours ago. Patient states he consumed more than usual. Patient also reports that he has been using meth x 4 days straight.  Patient also c/o right arm tingling.

## 2022-07-04 ENCOUNTER — Emergency Department (HOSPITAL_COMMUNITY)
Admission: EM | Admit: 2022-07-04 | Discharge: 2022-07-04 | Disposition: A | Discharge status: Home / Self Care | Payer: BLUE CROSS/BLUE SHIELD | Attending: Emergency Medicine | Admitting: Emergency Medicine

## 2022-07-04 ENCOUNTER — Other Ambulatory Visit: Payer: Self-pay

## 2022-07-04 ENCOUNTER — Encounter (HOSPITAL_COMMUNITY): Payer: Self-pay

## 2022-07-04 DIAGNOSIS — T7840XA Allergy, unspecified, initial encounter: Secondary | ICD-10-CM

## 2022-07-04 DIAGNOSIS — L509 Urticaria, unspecified: Secondary | ICD-10-CM | POA: Diagnosis not present

## 2022-07-04 DIAGNOSIS — R21 Rash and other nonspecific skin eruption: Secondary | ICD-10-CM | POA: Diagnosis present

## 2022-07-04 IMAGING — CR DG CHEST 2V
2 series · 2 of 2 positions shown · non-contrast
Comparison: June 19, 2020.

CLINICAL DATA: Chest pain.

EXAM:
CHEST - 2 VIEW

[w chest pa]
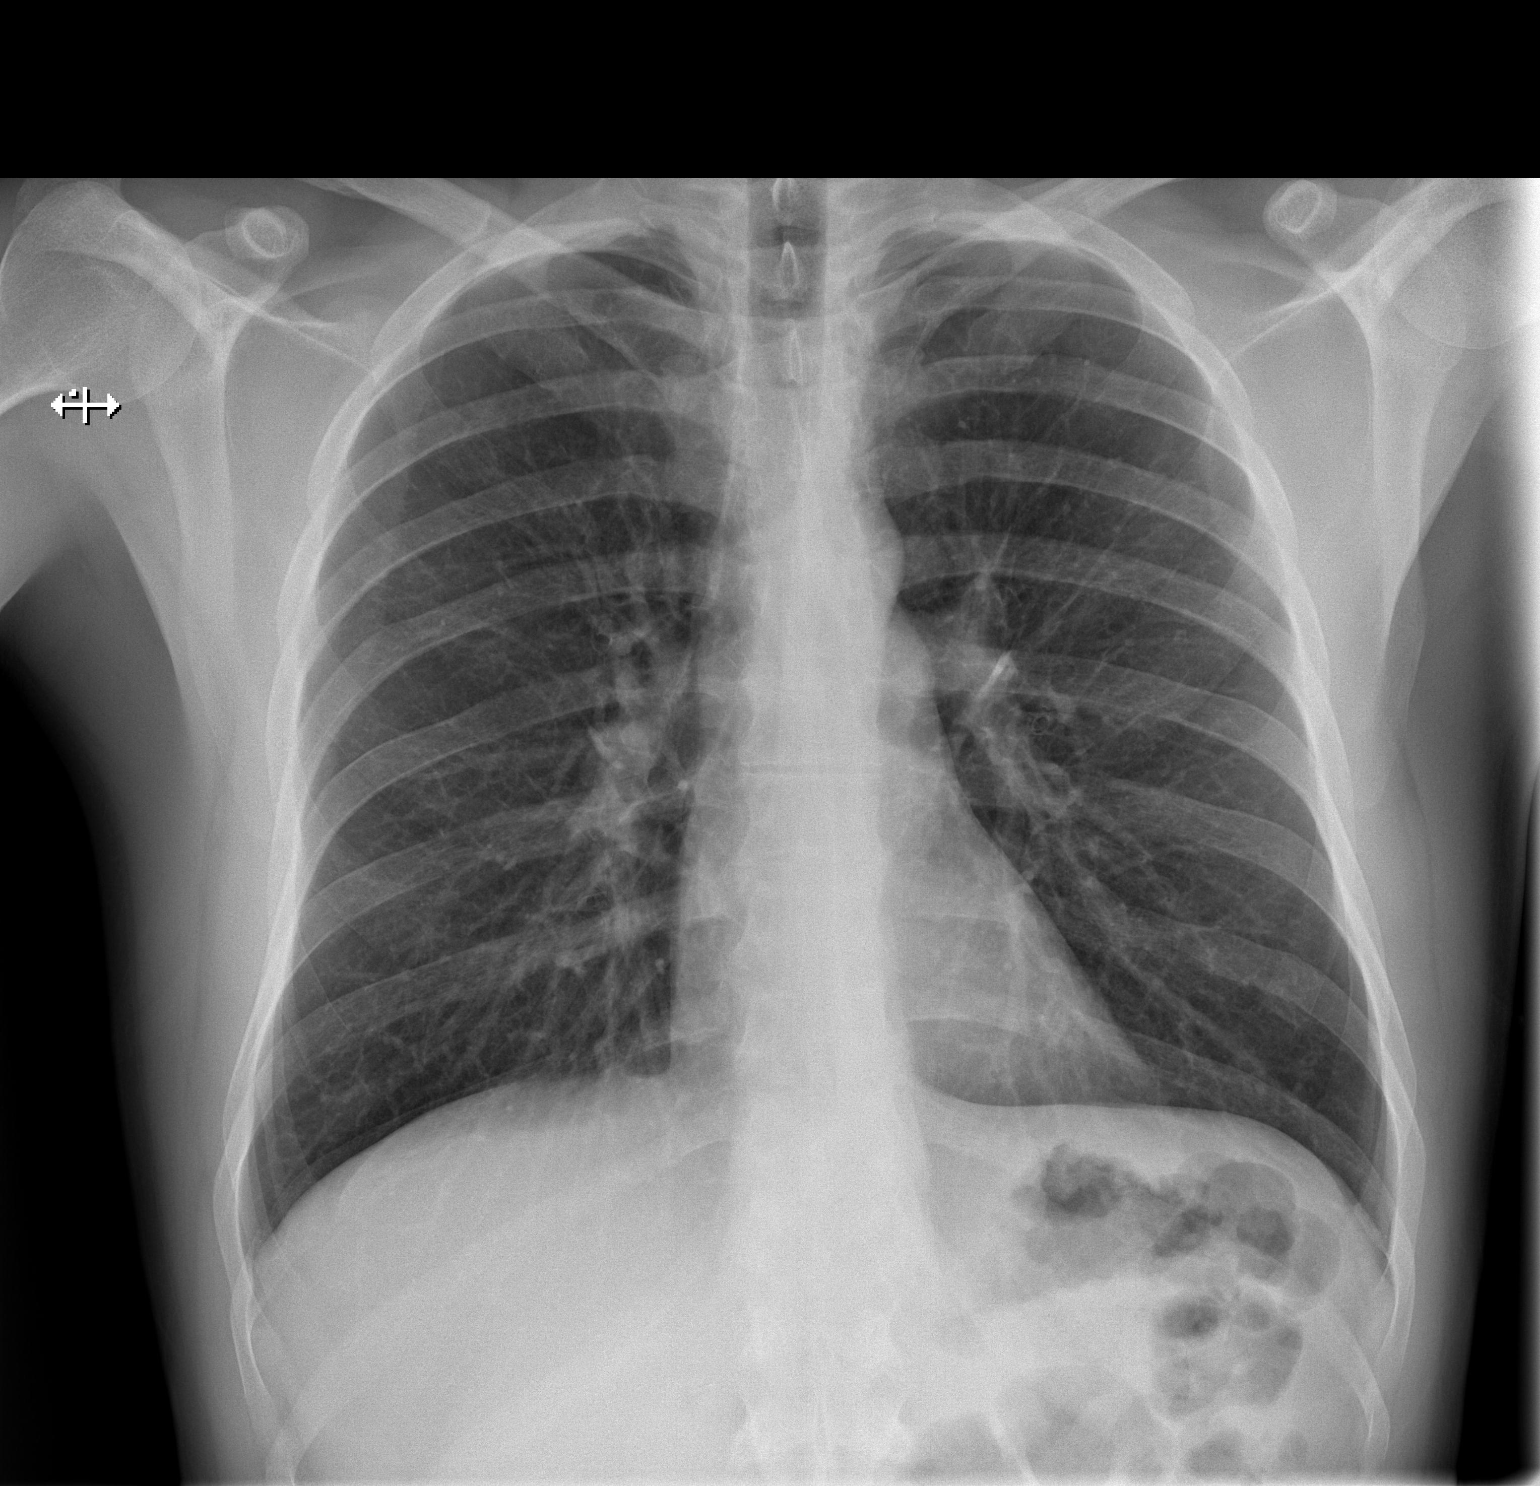

[w chest lat]
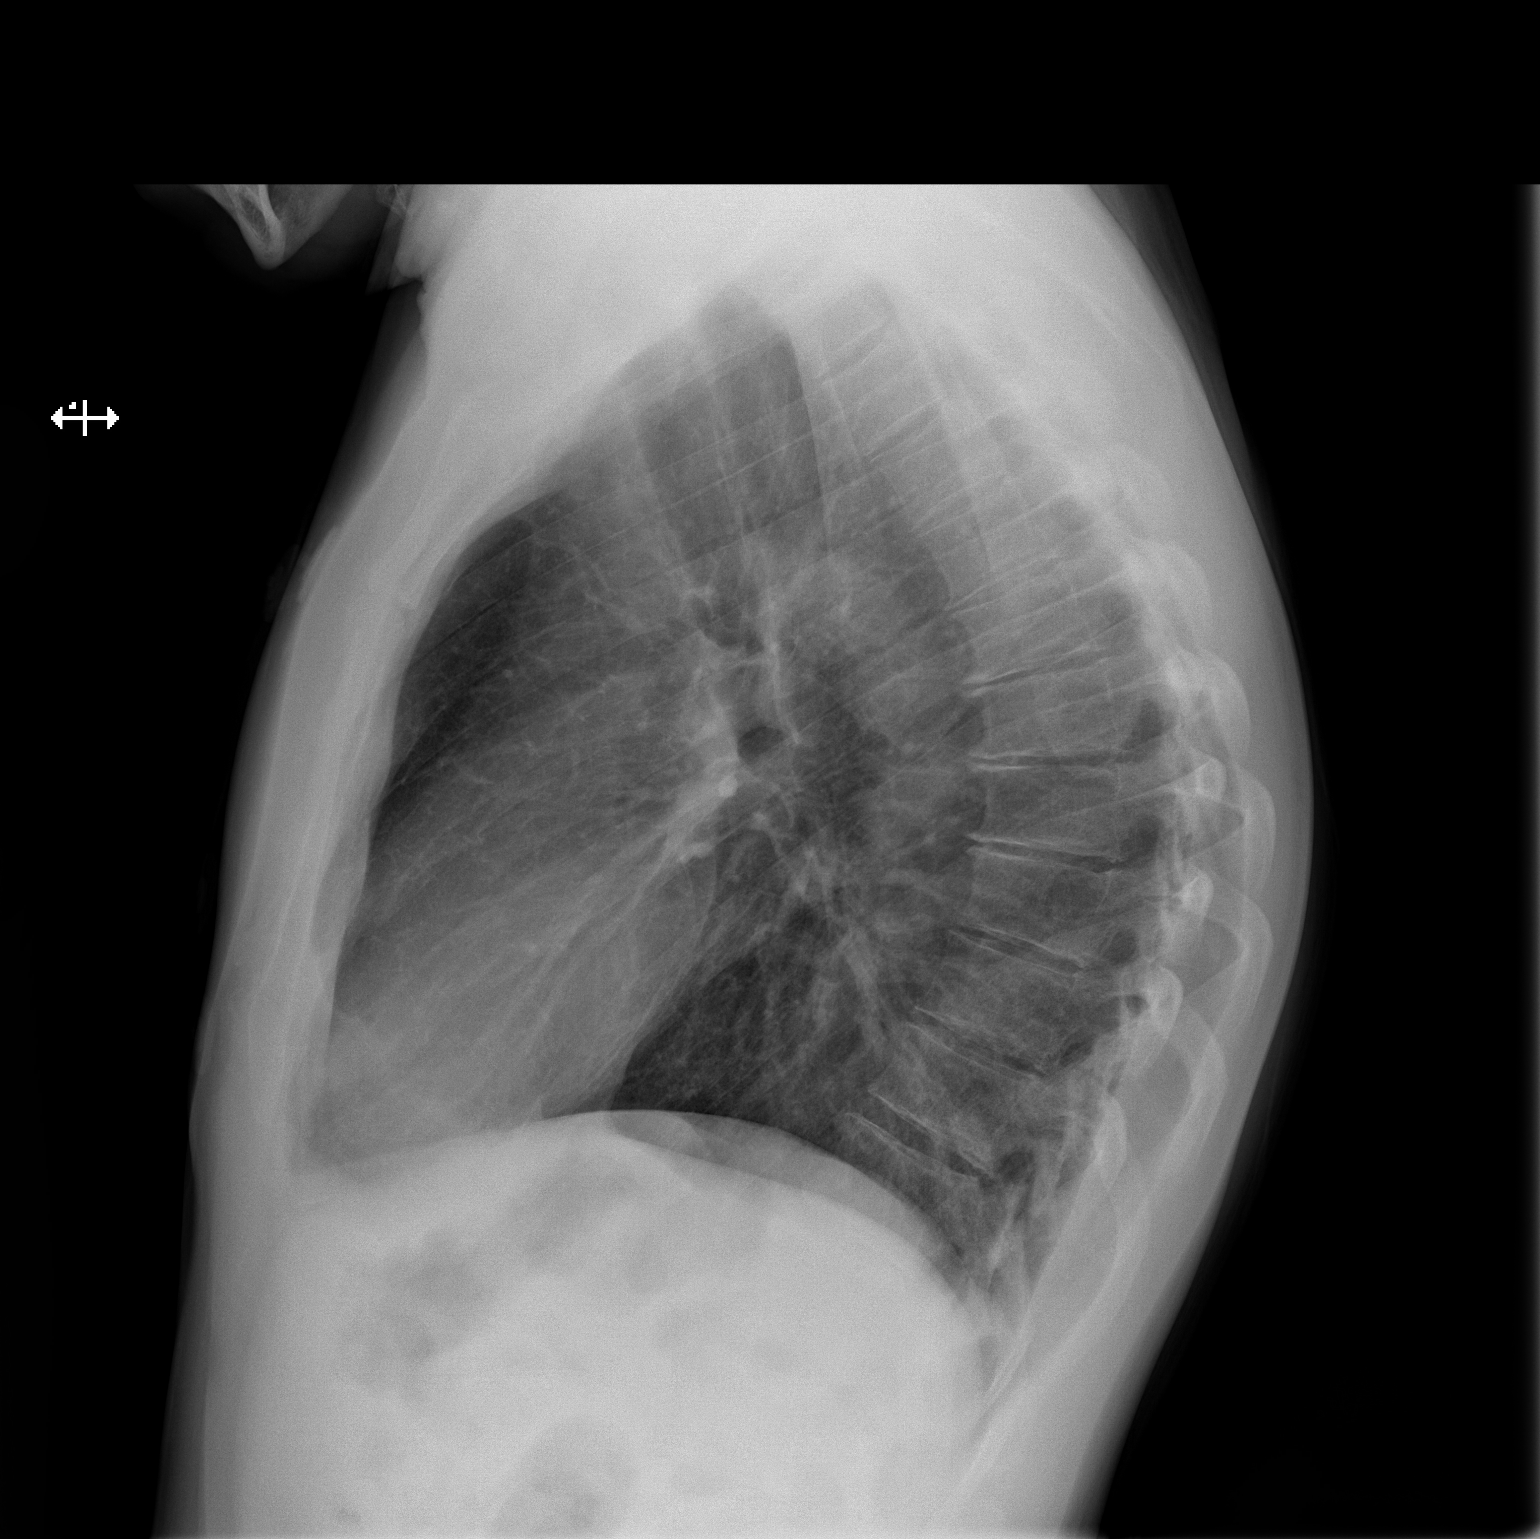

[2 of 2 positions shown; findings below may reference images not displayed]

FINDINGS: The heart size and mediastinal contours are within normal limits.
Both lungs are clear. No pneumothorax or pleural effusion is noted.
The visualized skeletal structures are unremarkable.
IMPRESSION: No active cardiopulmonary disease.

## 2022-07-04 MED ORDER — PREDNISONE 20 MG PO TABS
60.0000 mg | ORAL_TABLET | Freq: Once | ORAL | Status: AC
  Administered 2022-07-04: 60 mg via ORAL
  Filled 2022-07-04: qty 3

## 2022-07-04 MED ORDER — DIPHENHYDRAMINE HCL 25 MG PO CAPS
25.0000 mg | ORAL_CAPSULE | Freq: Once | ORAL | Status: AC
  Administered 2022-07-04: 25 mg via ORAL
  Filled 2022-07-04: qty 1

## 2022-07-04 NOTE — ED Triage Notes (Signed)
Patient said he took suboxone yesterday and tonight his face is chapped, red, he said he cant stop throwing up. He said it has to be the suboxone, he said this is an allergic reaction.

## 2022-07-04 NOTE — ED Provider Notes (Signed)
[facility] DEPT Provider Note   CSN: [id number] Arrival date & time: 07/04/22  0041     History  Chief Complaint  Patient presents with   Medication Reaction    Brian [name] is a 36 y.o. male.  Patient is a 36 year old male with history of polysubstance abuse, substance-induced psychosis.  Patient presenting today for evaluation of itching.  He tells me he had been using crystal meth for the past few days, then yesterday took a dose of Suboxone to "bring him down".  Shortly after taking the Suboxone, he began itching all over and developed rash to his face and chest.  He is concerned he is having an allergic reaction.  He denies to me he is having throat swelling or difficulty breathing.  He reports taking hydroxyzine at home prior to coming here.  The history is provided by the patient.       Home Medications Prior to Admission medications   Medication Sig Start Date End Date Taking? Authorizing Provider  buPROPion (WELLBUTRIN XL) 300 MG 24 hr tablet Take 1 tablet (300 mg total) by mouth daily. 05/06/22   Mayers, Cari S, PA-C  gabapentin (NEURONTIN) 300 MG capsule Take 1 cap PO BID for one week, then take 1 cap PO qhs for one week, then d/c 05/06/22   Mayers, Cari S, PA-C  hydrOXYzine (ATARAX) 25 MG tablet Take 1 tablet (25 mg total) by mouth 3 (three) times daily as needed for anxiety. 05/06/22   Mayers, Cari S, PA-C  isosorbide mononitrate (IMDUR) 30 MG 24 hr tablet Take 0.5 tablets (15 mg total) by mouth daily. 05/06/22   Mayers, Cari S, PA-C  LIPITOR 80 MG tablet Take 1 tablet (80 mg total) by mouth at bedtime. 05/06/22   Mayers, Cari S, PA-C  QUEtiapine (SEROQUEL) 50 MG tablet Take 2 tabs PO qhs for one week , then take 1 tab PO qhs for one week, then D/C 05/06/22   Mayers, Cari S, PA-C  FLUoxetine (PROZAC) 20 MG capsule Take 1 capsule (20 mg total) by mouth daily. For depression Patient not taking: Reported on 11/04/2019 04/01/18 05/04/20  Lindell Spar  I, NP  sucralfate (CARAFATE) 1 g tablet Take 1 tablet (1 g total) by mouth 4 (four) times daily -  with meals and at bedtime. Patient not taking: Reported on 11/04/2019 08/30/19 05/04/20  Lorayne Bender, PA-C      Allergies    Fish allergy and Shellfish allergy    Review of Systems   Review of Systems  All other systems reviewed and are negative.   Physical Exam Updated Vital Signs BP 120/85   Pulse 96   Temp 98.6 F (37 C) (Oral)   Resp 20   SpO2 99%  Physical Exam Vitals and nursing note reviewed.  Constitutional:      General: He is not in acute distress.    Appearance: He is well-developed. He is not diaphoretic.  HENT:     Head: Normocephalic and atraumatic.     Mouth/Throat:     Pharynx: No oropharyngeal exudate or posterior oropharyngeal erythema.  Cardiovascular:     Rate and Rhythm: Normal rate and regular rhythm.     Heart sounds: No murmur heard.    No friction rub.  Pulmonary:     Effort: Pulmonary effort is normal. No respiratory distress.     Breath sounds: Normal breath sounds. No stridor. No wheezing or rales.  Abdominal:     General: Bowel sounds  are normal. There is no distension.     Palpations: Abdomen is soft.     Tenderness: There is no abdominal tenderness.  Musculoskeletal:        General: Normal range of motion.     Cervical back: Normal range of motion and neck supple.  Skin:    General: Skin is warm and dry.     Comments: There is some urticarial lesions noted to the upper chest and face.  Neurological:     Mental Status: He is alert and oriented to person, place, and time.     Coordination: Coordination normal.     ED Results / Procedures / Treatments   Labs (all labs ordered are listed, but only abnormal results are displayed) Labs Reviewed - No data to display  EKG None  Radiology No results found.  Procedures Procedures    Medications Ordered in ED Medications  predniSONE (DELTASONE) tablet 60 mg (has no administration in  time range)  diphenhydrAMINE (BENADRYL) capsule 25 mg (has no administration in time range)    ED Course/ Medical Decision Making/ A&P  Patient with history of polysubstance abuse presenting for a possible reaction to Suboxone.  He reports taking 1 of these pills last night, then developing rash to his face and chest.  Patient was given prednisone and Benadryl and observed for several hours.  He seems to be improving and I feel can safely be discharged.  Final Clinical Impression(s) / ED Diagnoses Final diagnoses:  None    Rx / DC Orders ED Discharge Orders     None         Veryl Speak, MD 07/04/22 [phone]

## 2022-07-04 NOTE — Discharge Instructions (Signed)
Take Benadryl 25 mg every 6 hours as needed for itching.  Return to the ER if symptoms significantly worsen or change.

## 2022-07-06 ENCOUNTER — Other Ambulatory Visit (HOSPITAL_COMMUNITY)
Admission: EM | Admit: 2022-07-06 | Discharge: 2022-07-07 | Disposition: A | Discharge status: Home / Self Care | Payer: BLUE CROSS/BLUE SHIELD | Attending: Psychiatry | Admitting: Psychiatry

## 2022-07-06 ENCOUNTER — Other Ambulatory Visit: Payer: Self-pay

## 2022-07-06 DIAGNOSIS — Z5900 Homelessness unspecified: Secondary | ICD-10-CM

## 2022-07-06 DIAGNOSIS — F1994 Other psychoactive substance use, unspecified with psychoactive substance-induced mood disorder: Secondary | ICD-10-CM

## 2022-07-06 DIAGNOSIS — Z20822 Contact with and (suspected) exposure to covid-19: Secondary | ICD-10-CM | POA: Diagnosis not present

## 2022-07-06 DIAGNOSIS — F1920 Other psychoactive substance dependence, uncomplicated: Secondary | ICD-10-CM

## 2022-07-06 DIAGNOSIS — R45851 Suicidal ideations: Secondary | ICD-10-CM | POA: Diagnosis not present

## 2022-07-06 DIAGNOSIS — F314 Bipolar disorder, current episode depressed, severe, without psychotic features: Secondary | ICD-10-CM | POA: Diagnosis not present

## 2022-07-06 DIAGNOSIS — F1990 Other psychoactive substance use, unspecified, uncomplicated: Secondary | ICD-10-CM | POA: Diagnosis present

## 2022-07-06 DIAGNOSIS — F1520 Other stimulant dependence, uncomplicated: Secondary | ICD-10-CM | POA: Insufficient documentation

## 2022-07-06 DIAGNOSIS — F1120 Opioid dependence, uncomplicated: Secondary | ICD-10-CM

## 2022-07-06 DIAGNOSIS — F1910 Other psychoactive substance abuse, uncomplicated: Secondary | ICD-10-CM | POA: Diagnosis present

## 2022-07-06 LAB — POCT URINE DRUG SCREEN - MANUAL ENTRY (I-SCREEN)
POC Amphetamine UR: POSITIVE — AB
POC Buprenorphine (BUP): POSITIVE — AB
POC Cocaine UR: NOT DETECTED — NL
POC Marijuana UR: NOT DETECTED — NL
POC Methadone UR: NOT DETECTED — NL
POC Methamphetamine UR: POSITIVE — AB
POC Morphine: NOT DETECTED — NL
POC Oxazepam (BZO): NOT DETECTED — NL
POC Oxycodone UR: NOT DETECTED — NL
POC Secobarbital (BAR): NOT DETECTED — NL

## 2022-07-06 LAB — COMPREHENSIVE METABOLIC PANEL
ALT: 60 U/L — ABNORMAL HIGH (ref 0–44)
AST: 72 U/L — ABNORMAL HIGH (ref 15–41)
Albumin: 3.9 g/dL (ref 3.5–5.0)
Alkaline Phosphatase: 82 U/L (ref 38–126)
Anion gap: 10 (ref 5–15)
BUN: 16 mg/dL (ref 6–20)
CO2: 25 mmol/L (ref 22–32)
Calcium: 9.6 mg/dL (ref 8.9–10.3)
Chloride: 102 mmol/L (ref 98–111)
Creatinine, Ser: 1.18 mg/dL (ref 0.61–1.24)
GFR, Estimated: >60 mL/min (ref >60)
Glucose, Bld: 83 mg/dL (ref 70–99)
Potassium: 4 mmol/L (ref 3.5–5.1)
Sodium: 137 mmol/L (ref 135–145)
Total Bilirubin: 0.6 mg/dL (ref 0.3–1.2)
Total Protein: 7.2 g/dL (ref 6.5–8.1)

## 2022-07-06 LAB — CBC WITH DIFFERENTIAL/PLATELET
Abs Immature Granulocytes: 0.03 10*3/uL (ref 0.00–0.07)
Basophils Absolute: 0.1 10*3/uL (ref 0.0–0.1)
Basophils Relative: 1 %
Eosinophils Absolute: 0.1 10*3/uL (ref 0.0–0.5)
Eosinophils Relative: 1 %
HCT: 39.3 % (ref 39.0–52.0)
Hemoglobin: 13.4 g/dL (ref 13.0–17.0)
Immature Granulocytes: 0 %
Lymphocytes Relative: 20 %
Lymphs Abs: 2 10*3/uL (ref 0.7–4.0)
MCH: 29.8 pg (ref 26.0–34.0)
MCHC: 34.1 g/dL (ref 30.0–36.0)
MCV: 87.3 fL (ref 80.0–100.0)
Monocytes Absolute: 1 10*3/uL (ref 0.1–1.0)
Monocytes Relative: 10 %
Neutro Abs: 6.9 10*3/uL (ref 1.7–7.7)
Neutrophils Relative %: 68 %
Platelets: 273 10*3/uL (ref 150–400)
RBC: 4.5 MIL/uL (ref 4.22–5.81)
RDW: 13.2 % (ref 11.5–15.5)
WBC: 10.2 10*3/uL (ref 4.0–10.5)
nRBC: 0 % (ref 0.0–0.2)

## 2022-07-06 LAB — POC SARS CORONAVIRUS 2 AG: SARSCOV2ONAVIRUS 2 AG: NEGATIVE

## 2022-07-06 LAB — RESP PANEL BY RT-PCR (FLU A&B, COVID) ARPGX2
Influenza A by PCR: NEGATIVE
Influenza B by PCR: NEGATIVE
SARS Coronavirus 2 by RT PCR: NEGATIVE

## 2022-07-06 LAB — ETHANOL: Alcohol, Ethyl (B): <10 mg/dL (ref <10)

## 2022-07-06 MED ORDER — GABAPENTIN 100 MG PO CAPS
200.0000 mg | ORAL_CAPSULE | Freq: Three times a day (TID) | ORAL | Status: DC
  Administered 2022-07-06 – 2022-07-07 (×5): 200 mg via ORAL
  Filled 2022-07-06 (×5): qty 2

## 2022-07-06 MED ORDER — CLONIDINE HCL 0.1 MG PO TABS: 0.1000 mg | ORAL_TABLET | Freq: Every day | ORAL | Status: DC

## 2022-07-06 MED ORDER — QUETIAPINE FUMARATE 100 MG PO TABS
100.0000 mg | ORAL_TABLET | Freq: Every day | ORAL | Status: DC
  Administered 2022-07-06: 100 mg via ORAL
  Filled 2022-07-06: qty 1

## 2022-07-06 MED ORDER — CLONIDINE HCL 0.1 MG PO TABS
0.1000 mg | ORAL_TABLET | Freq: Four times a day (QID) | ORAL | Status: DC
  Administered 2022-07-06 (×2): 0.1 mg via ORAL
  Filled 2022-07-06 (×3): qty 1

## 2022-07-06 MED ORDER — LOPERAMIDE HCL 2 MG PO CAPS: 2.0000 mg | ORAL_CAPSULE | ORAL | Status: DC | PRN

## 2022-07-06 MED ORDER — ONDANSETRON 4 MG PO TBDP: 4.0000 mg | ORAL_TABLET | Freq: Four times a day (QID) | ORAL | Status: DC | PRN

## 2022-07-06 MED ORDER — LORAZEPAM 1 MG PO TABS
1.0000 mg | ORAL_TABLET | ORAL | Status: DC | PRN
  Administered 2022-07-06 (×2): 1 mg via ORAL
  Filled 2022-07-06 (×2): qty 1

## 2022-07-06 MED ORDER — OLANZAPINE 5 MG PO TBDP: 5.0000 mg | ORAL_TABLET | Freq: Three times a day (TID) | ORAL | Status: DC | PRN

## 2022-07-06 MED ORDER — NAPROXEN 500 MG PO TABS
500.0000 mg | ORAL_TABLET | Freq: Two times a day (BID) | ORAL | Status: DC | PRN
  Administered 2022-07-06: 500 mg via ORAL
  Filled 2022-07-06 (×2): qty 1

## 2022-07-06 MED ORDER — MAGNESIUM HYDROXIDE 400 MG/5ML PO SUSP: 30.0000 mL | Freq: Every day | ORAL | Status: DC | PRN

## 2022-07-06 MED ORDER — DICYCLOMINE HCL 20 MG PO TABS
20.0000 mg | ORAL_TABLET | Freq: Four times a day (QID) | ORAL | Status: DC | PRN
  Administered 2022-07-06: 20 mg via ORAL
  Filled 2022-07-06: qty 1

## 2022-07-06 MED ORDER — ALUM & MAG HYDROXIDE-SIMETH 200-200-20 MG/5ML PO SUSP: 30.0000 mL | ORAL | Status: DC | PRN

## 2022-07-06 MED ORDER — HYDROXYZINE HCL 25 MG PO TABS
25.0000 mg | ORAL_TABLET | Freq: Four times a day (QID) | ORAL | Status: DC | PRN
  Administered 2022-07-06 (×3): 25 mg via ORAL
  Filled 2022-07-06 (×3): qty 1

## 2022-07-06 MED ORDER — ACETAMINOPHEN 325 MG PO TABS
650.0000 mg | ORAL_TABLET | Freq: Four times a day (QID) | ORAL | Status: DC | PRN
  Administered 2022-07-06: 650 mg via ORAL
  Filled 2022-07-06: qty 2

## 2022-07-06 MED ORDER — METHOCARBAMOL 500 MG PO TABS
500.0000 mg | ORAL_TABLET | Freq: Three times a day (TID) | ORAL | Status: DC | PRN
  Administered 2022-07-06: 500 mg via ORAL
  Filled 2022-07-06 (×2): qty 1

## 2022-07-06 MED ORDER — HYDROCORTISONE 0.5 % EX CREA
TOPICAL_CREAM | Freq: Three times a day (TID) | CUTANEOUS | Status: DC | PRN
  Administered 2022-07-07: 1 via TOPICAL

## 2022-07-06 MED ORDER — TRAZODONE HCL 50 MG PO TABS: 50.0000 mg | ORAL_TABLET | Freq: Every evening | ORAL | Status: DC | PRN

## 2022-07-06 MED ORDER — CLONIDINE HCL 0.1 MG PO TABS: 0.1000 mg | ORAL_TABLET | ORAL | Status: DC

## 2022-07-06 MED ORDER — TRIPLE ANTIBIOTIC 3.5-400-5000 EX OINT
1.0000 | TOPICAL_OINTMENT | Freq: Three times a day (TID) | CUTANEOUS | Status: DC | PRN
  Administered 2022-07-06 (×2): 1 via TOPICAL
  Filled 2022-07-06 (×3): qty 1

## 2022-07-06 NOTE — ED Notes (Signed)
Pt st window asking for food and his very loud and demanding. A salad was given. No c/o pain or distress. Will continue to monitor for safety

## 2022-07-06 NOTE — ED Provider Notes (Signed)
[facility] Continuous Assessment Admission H&P  Date: 07/06/22 Patient Name: JAGER KOSKA MRN: [id number] Chief Complaint:  Chief Complaint  Patient presents with   Addiction Problem      Diagnoses:  Final diagnoses:  Bipolar disorder, current episode depressed, severe, without psychotic features ([place])  Addiction ([place])  Suicidal ideation    NAT:[id number] Bearce is a 36 y/o single male with a history of bipolar disorder, suicidal ideation, polysubstance abuse, heroin, methamphetamine and cannabis use presenting voluntarily to [facility] to detox from methamphetamine use.  Patient reports that he started using methamphetamine on Monday and last weekend he started snorting Wellbutrin.  Patient states that he was at an [employer] and he saw his girlfriend there with her boss and that upset him because he believes that there is something going on between them.. Patient was most recently in a sober living home [facility], but was discharged from the program due to his substance use.  Patient states he can return to [place] after he has received substance abuse treatment for two weeks. Patient states that he has been using suboxone that he was prescribed when he was in treatment at [facility].   Patients stated that he feels that he has not properly grieved for his grandmother that passed away last year. Patient endorses having some suicidal ideations but no plan or intent to harm himself.  Patient wants to go to [facility] for residential treatment for substance abuse treatment and is interested in having therapy. Patient is alert oriented x4, anxious but cooperative, mood is depressed with congruent affect does not appear he is responding to internal or external stimuli, or having delusions or paranoia at this time. Patient meets criteria for inpatient treatment and will be admitted to [facility] continuous assessment for crisis management, safety and stabilization.   PHQ 2-9:   [place] ED from 07/06/2022 in [facility] ED from 04/11/2022 in [facility] ED from 03/30/2022 in [facility]  Thoughts that you would be better off dead, or of hurting yourself in some way Several days Not at all Not at all  PHQ-9 Total Score _0 Flowsheet Row ED from 07/06/2022 in [facility] ED from 07/04/2022 in [place] [facility] ED from 07/03/2022 in [place] DEPT  C-SSRS RISK CATEGORY No Risk No Risk No Risk        Total Time spent with patient: 30 minutes  Musculoskeletal  Strength & Muscle Tone: within normal limits Gait & Station: normal Patient leans: N/A  Psychiatric Specialty Exam  Presentation General Appearance: Disheveled  Eye Contact:Good  Speech:Clear and Coherent  Speech Volume:Normal  Handedness:Right   Mood and Affect  Mood:Depressed; Anxious  Affect:Congruent   Thought Process  Thought Processes:Coherent  Descriptions of Associations:Intact  Orientation:Full (Time, Place and Person)  Thought Content:WDL  Diagnosis of Schizophrenia or Schizoaffective disorder in past: No   Hallucinations:Hallucinations: None  Ideas of Reference:None  Suicidal Thoughts:Suicidal Thoughts: Yes, Passive SI Passive Intent and/or Plan: Without Intent; Without Plan  Homicidal Thoughts:Homicidal Thoughts: No   Sensorium  Memory:Immediate Good; Recent Good; Remote Good  Judgment:Fair  Insight:Fair   Executive Functions  Concentration:Fair  Attention Span:Fair  [place] of Knowledge:Good  Language:Good   Psychomotor Activity  Psychomotor Activity:Psychomotor Activity: Normal   Assets  Assets:Communication Skills; Desire for Improvement; Housing; Physical Health; Social Support  Sleep  Sleep:Sleep: Fair Number of Hours of Sleep:  -1   Nutritional Assessment (For OBS and FBC admissions only) Has the patient had a weight loss or gain of 10 pounds or more in the last 3 months?: No Has the patient had a decrease in food intake/or appetite?: No Does the patient have dental problems?: No Does the patient have eating habits or behaviors that may be indicators of an eating disorder including binging or inducing vomiting?: No Has the patient recently lost weight without trying?: 0    Physical Exam Constitutional:      Appearance: Normal appearance.  HENT:     Head: Normocephalic and atraumatic.     Nose: Nose normal.  Eyes:     Pupils: Pupils are equal, round, and reactive to light.  Cardiovascular:     Rate and Rhythm: Normal rate.  Pulmonary:     Effort: Pulmonary effort is normal.  Abdominal:     General: Abdomen is flat.  Musculoskeletal:        General: Normal range of motion.     Cervical back: Normal range of motion.  Skin:    General: Skin is warm.  Neurological:     Mental Status: He is alert and oriented to person, place, and time.  Psychiatric:        Attention and Perception: Attention normal.        Mood and Affect: Mood is anxious and depressed.        Speech: Speech is delayed.        Behavior: Behavior is cooperative.        Thought Content: Thought content is not paranoid or delusional. Thought content includes suicidal ideation. Thought content does not include homicidal ideation. Thought content does not include homicidal or suicidal plan.        Cognition and Memory: Cognition normal.        Judgment: Judgment is impulsive.    Review of Systems  Constitutional: Negative.   HENT: Negative.    Eyes: Negative.   Cardiovascular: Negative.   Gastrointestinal: Negative.   Genitourinary: Negative.   Musculoskeletal: Negative.   Skin: Negative.   Neurological: Negative.   Endo/Heme/Allergies: Negative.   Psychiatric/Behavioral: Negative.      Blood pressure (!) 129/90, pulse 98,  temperature 98.7 F (37.1 C), resp. rate 20, SpO2 98 %. There is no height or weight on file to calculate BMI.  Past Psychiatric History:[facility], [facility], [facility],   Is the patient at risk to self? Yes  Has the patient been a risk to self in the past 6 months? No .    Has the patient been a risk to self within the distant past? Yes   Is the patient a risk to others? No   Has the patient been a risk to others in the past 6 months? No   Has the patient been a risk to others within the distant past? No   Past Medical History:  Past Medical History:  Diagnosis Date   Alcohol abuse    Anxiety    Bipolar 2 disorder ([place])    Cocaine abuse ([place])    Depression    Kidney stones    Methamphetamine abuse, episodic ([place])    Myocardial infarction ([place])    Narcotic abuse ([place])    Peptic ulcer    Polysubstance abuse ([place])    Renal disorder    kidney stones    Past Surgical History:  Procedure Laterality Date   ESOPHAGOGASTRODUODENOSCOPY  N/A 05/29/2014   Procedure: ESOPHAGOGASTRODUODENOSCOPY (EGD) ;  Surgeon: Beryle Beams, MD;  Location: [facility] ENDOSCOPY;  Service: Endoscopy;  Laterality: N/A;  check with Dr. Collene Mares about sedation type/timinng - I recommend MAC   HAND SURGERY  2006   LEFT HEART CATH AND CORONARY ANGIOGRAPHY N/A 06/21/2020   Procedure: LEFT HEART CATH AND CORONARY ANGIOGRAPHY;  Surgeon: Nelva Bush, MD;  Location: [facility];  Service: Cardiovascular;  Laterality: N/A;    Family History:  Family History  Problem Relation Age of Onset   Heart disease Father     Social History:  Social History   Socioeconomic History   Marital status: Single    Spouse name: Not on file   Number of children: Not on file   Years of education: Not on file   Highest education level: Not on file  Occupational History   Not on file  Tobacco Use   Smoking status: Every Day    Packs/day: 0.50    Types: Cigarettes   Smokeless tobacco: Never  Vaping Use   Vaping Use: Never  used  Substance and Sexual Activity   Alcohol use: Not Currently   Drug use: Yes    Types: IV, Methamphetamines   Sexual activity: Not Currently  Other Topics Concern   Not on file  Social History Narrative   ** Merged History Encounter **       Lives with a friend   Lost house and custody of son 9broke up with fiancee)   Nature conservation officer   Social Determinants of Health   Financial Resource Strain: Not on file  Food Insecurity: Not on file  Transportation Needs: Not on file  Physical Activity: Not on file  Stress: Not on file  Social Connections: Not on file  Intimate Partner Violence: Not on file    SDOH:  [place]: High Risk (08/09/2020)  Alcohol Screen: Low Risk  (12/21/2020)  Depression (PHQ2-9): Medium Risk (07/06/2022)  Tobacco Use: High Risk (07/04/2022)    Last Labs:  Admission on 07/06/2022  Component Date Value Ref Range Status   WBC 07/06/2022 10.2  4.0 - 10.5 K/uL Final   RBC 07/06/2022 4.50  4.22 - 5.81 MIL/uL Final   Hemoglobin 07/06/2022 13.4  13.0 - 17.0 g/dL Final   HCT 07/06/2022 39.3  39.0 - 52.0 % Final   MCV 07/06/2022 87.3  80.0 - 100.0 fL Final   MCH 07/06/2022 29.8  26.0 - 34.0 pg Final   MCHC 07/06/2022 34.1  30.0 - 36.0 g/dL Final   RDW 07/06/2022 13.2  11.5 - 15.5 % Final   Platelets 07/06/2022 273  150 - 400 K/uL Final   nRBC 07/06/2022 0.0  0.0 - 0.2 % Final   Neutrophils Relative % 07/06/2022 68  % Final   Neutro Abs 07/06/2022 6.9  1.7 - 7.7 K/uL Final   Lymphocytes Relative 07/06/2022 20  % Final   Lymphs Abs 07/06/2022 2.0  0.7 - 4.0 K/uL Final   Monocytes Relative 07/06/2022 10  % Final   Monocytes Absolute 07/06/2022 1.0  0.1 - 1.0 K/uL Final   Eosinophils Relative 07/06/2022 1  % Final   Eosinophils Absolute 07/06/2022 0.1  0.0 - 0.5 K/uL Final   Basophils Relative 07/06/2022 1  % Final   Basophils Absolute 07/06/2022 0.1  0.0 - 0.1 K/uL Final   Immature Granulocytes 07/06/2022 0  % Final   Abs Immature  Granulocytes 07/06/2022 0.03  0.00 - 0.07 K/uL Final  Performed at [facility], [place] [address] [place], [place] [address]   Sodium 07/06/2022 137  135 - 145 mmol/L Final   Potassium 07/06/2022 4.0  3.5 - 5.1 mmol/L Final   Chloride 07/06/2022 102  98 - 111 mmol/L Final   CO2 07/06/2022 25  22 - 32 mmol/L Final   Glucose, Bld 07/06/2022 83  70 - 99 mg/dL Final   Glucose reference range applies only to samples taken after fasting for at least 8 hours.   BUN 07/06/2022 16  6 - 20 mg/dL Final   Creatinine, Ser 07/06/2022 1.18  0.61 - 1.24 mg/dL Final   Calcium 07/06/2022 9.6  8.9 - 10.3 mg/dL Final   Total Protein 07/06/2022 7.2  6.5 - 8.1 g/dL Final   Albumin 07/06/2022 3.9  3.5 - 5.0 g/dL Final   AST 07/06/2022 72 (H)  15 - 41 U/L Final   ALT 07/06/2022 60 (H)  0 - 44 U/L Final   Alkaline Phosphatase 07/06/2022 82  38 - 126 U/L Final   Total Bilirubin 07/06/2022 0.6  0.3 - 1.2 mg/dL Final   GFR, Estimated 07/06/2022 >60  >60 mL/min Final   Comment: (NOTE) Calculated using the CKD-EPI Creatinine Equation (2021)    Anion gap 07/06/2022 10  5 - 15 Final   Performed at [facility] [address] [place], [place] [address]   Alcohol, Ethyl (B) 07/06/2022 <10  <10 mg/dL Final   Comment: (NOTE) Lowest detectable limit for serum alcohol is 10 mg/dL.  For medical purposes only. Performed at [facility], [place] [address] [place], [place] [address]    POC Amphetamine UR 07/06/2022 Positive (PE)  NONE DETECTED (Cut Off Level 1000 ng/mL) Final   POC Secobarbital (BAR) 07/06/2022 None Detected  NONE DETECTED (Cut Off Level 300 ng/mL) Final   POC Buprenorphine (BUP) 07/06/2022 Positive (PE)  NONE DETECTED (Cut Off Level 10 ng/mL) Final   POC Oxazepam (BZO) 07/06/2022 None Detected  NONE DETECTED (Cut Off Level 300 ng/mL) Final   POC Cocaine UR 07/06/2022 None Detected  NONE DETECTED (Cut Off Level 300 ng/mL) Final   POC Methamphetamine UR 07/06/2022 Positive (PE)  NONE  DETECTED (Cut Off Level 1000 ng/mL) Final   POC Morphine 07/06/2022 None Detected  NONE DETECTED (Cut Off Level 300 ng/mL) Final   POC Methadone UR 07/06/2022 None Detected  NONE DETECTED (Cut Off Level 300 ng/mL) Final   POC Oxycodone UR 07/06/2022 None Detected  NONE DETECTED (Cut Off Level 100 ng/mL) Final   POC Marijuana UR 07/06/2022 None Detected  NONE DETECTED (Cut Off Level 50 ng/mL) Final   SARSCOV2ONAVIRUS 2 AG 07/06/2022 NEGATIVE  NEGATIVE Final   Comment: (NOTE) SARS-CoV-2 antigen NOT DETECTED.   Negative results are presumptive.  Negative results do not preclude SARS-CoV-2 infection and should not be used as the sole basis for treatment or other patient management decisions, including infection  control decisions, particularly in the presence of clinical signs and  symptoms consistent with COVID-19, or in those who have been in contact with the virus.  Negative results must be combined with clinical observations, patient history, and epidemiological information. The expected result is Negative.  Fact Sheet for Patients: HandmadeRecipes.com.cy  Fact Sheet for Healthcare Providers: FuneralLife.at  This test is not yet approved or cleared by the [place] FDA and  has been authorized for detection and/or diagnosis of SARS-CoV-2 by FDA under an Emergency Use Authorization (EUA).  This EUA will remain in  effect (meaning this test can be used) for the duration of  the COV                          ID-19 declaration under Section 564(b)(1) of the Act, 21 U.S.C. section 360bbb-3(b)(1), unless the authorization is terminated or revoked sooner.    Admission on 07/03/2022, Discharged on 07/03/2022  Component Date Value Ref Range Status   SARS Coronavirus 2 by RT PCR 07/03/2022 NEGATIVE  NEGATIVE Final   Comment: (NOTE) SARS-CoV-2 target nucleic acids are NOT DETECTED.  The SARS-CoV-2 RNA is generally detectable in upper  respiratory specimens during the acute phase of infection. The lowest concentration of SARS-CoV-2 viral copies this assay can detect is 138 copies/mL. A negative result does not preclude SARS-Cov-2 infection and should not be used as the sole basis for treatment or other patient management decisions. A negative result may occur with  improper specimen collection/handling, submission of specimen other than nasopharyngeal swab, presence of viral mutation(s) within the areas targeted by this assay, and inadequate number of viral copies(<138 copies/mL). A negative result must be combined with clinical observations, patient history, and epidemiological information. The expected result is Negative.  Fact Sheet for Patients:  EntrepreneurPulse.com.au  Fact Sheet for Healthcare Providers:  IncredibleEmployment.be  This test is no                          t yet approved or cleared by the [place] FDA and  has been authorized for detection and/or diagnosis of SARS-CoV-2 by FDA under an Emergency Use Authorization (EUA). This EUA will remain  in effect (meaning this test can be used) for the duration of the COVID-19 declaration under Section 564(b)(1) of the Act, 21 U.S.C.section 360bbb-3(b)(1), unless the authorization is terminated  or revoked sooner.       Influenza A by PCR 07/03/2022 NEGATIVE  NEGATIVE Final   Influenza B by PCR 07/03/2022 NEGATIVE  NEGATIVE Final   Comment: (NOTE) The Xpert Xpress SARS-CoV-2/FLU/RSV plus assay is intended as an aid in the diagnosis of influenza from Nasopharyngeal swab specimens and should not be used as a sole basis for treatment. Nasal washings and aspirates are unacceptable for Xpert Xpress SARS-CoV-2/FLU/RSV testing.  Fact Sheet for Patients: EntrepreneurPulse.com.au  Fact Sheet for Healthcare Providers: IncredibleEmployment.be  This test is not yet approved or  cleared by the [place] FDA and has been authorized for detection and/or diagnosis of SARS-CoV-2 by FDA under an Emergency Use Authorization (EUA). This EUA will remain in effect (meaning this test can be used) for the duration of the COVID-19 declaration under Section 564(b)(1) of the Act, 21 U.S.C. section 360bbb-3(b)(1), unless the authorization is terminated or revoked.  Performed at [facility], [place] [address] [place], [place] [address]    Alcohol, Ethyl (B) 07/03/2022 <10  <10 mg/dL Final   Comment: (NOTE) Lowest detectable limit for serum alcohol is 10 mg/dL.  For medical purposes only. Performed at [facility], [place] [address], [place], [place] [address]    Opiates 07/03/2022 NONE DETECTED  NONE DETECTED Final   Cocaine 07/03/2022 POSITIVE (A)  NONE DETECTED Final   Benzodiazepines 07/03/2022 NONE DETECTED  NONE DETECTED Final   Amphetamines 07/03/2022 POSITIVE (A)  NONE DETECTED Final   Tetrahydrocannabinol 07/03/2022 NONE DETECTED  NONE DETECTED Final   Barbiturates 07/03/2022 NONE DETECTED  NONE DETECTED Final   Comment: (NOTE) DRUG SCREEN FOR  MEDICAL PURPOSES ONLY.  IF CONFIRMATION IS NEEDED FOR ANY PURPOSE, NOTIFY LAB WITHIN 5 DAYS.  LOWEST DETECTABLE LIMITS FOR URINE DRUG SCREEN Drug Class                     Cutoff (ng/mL) Amphetamine and metabolites    1000 Barbiturate and metabolites    200 Benzodiazepine                 009 Tricyclics and metabolites     300 Opiates and metabolites        300 Cocaine and metabolites        300 THC                            50 Performed at [facility], [place] [address] [place], [place] [address]   Admission on 04/11/2022, Discharged on 04/14/2022  Component Date Value Ref Range Status   SARS Coronavirus 2 by RT PCR 04/11/2022 NEGATIVE  NEGATIVE Final   Comment: (NOTE) SARS-CoV-2 target nucleic acids are NOT DETECTED.  The SARS-CoV-2 RNA is generally  detectable in upper respiratory specimens during the acute phase of infection. The lowest concentration of SARS-CoV-2 viral copies this assay can detect is 138 copies/mL. A negative result does not preclude SARS-Cov-2 infection and should not be used as the sole basis for treatment or other patient management decisions. A negative result may occur with  improper specimen collection/handling, submission of specimen other than nasopharyngeal swab, presence of viral mutation(s) within the areas targeted by this assay, and inadequate number of viral copies(<138 copies/mL). A negative result must be combined with clinical observations, patient history, and epidemiological information. The expected result is Negative.  Fact Sheet for Patients:  EntrepreneurPulse.com.au  Fact Sheet for Healthcare Providers:  IncredibleEmployment.be  This test is no                          t yet approved or cleared by the [place] FDA and  has been authorized for detection and/or diagnosis of SARS-CoV-2 by FDA under an Emergency Use Authorization (EUA). This EUA will remain  in effect (meaning this test can be used) for the duration of the COVID-19 declaration under Section 564(b)(1) of the Act, 21 U.S.C.section 360bbb-3(b)(1), unless the authorization is terminated  or revoked sooner.       Influenza A by PCR 04/11/2022 NEGATIVE  NEGATIVE Final   Influenza B by PCR 04/11/2022 NEGATIVE  NEGATIVE Final   Comment: (NOTE) The Xpert Xpress SARS-CoV-2/FLU/RSV plus assay is intended as an aid in the diagnosis of influenza from Nasopharyngeal swab specimens and should not be used as a sole basis for treatment. Nasal washings and aspirates are unacceptable for Xpert Xpress SARS-CoV-2/FLU/RSV testing.  Fact Sheet for Patients: EntrepreneurPulse.com.au  Fact Sheet for Healthcare Providers: IncredibleEmployment.be  This test is not  yet approved or cleared by the [place] FDA and has been authorized for detection and/or diagnosis of SARS-CoV-2 by FDA under an Emergency Use Authorization (EUA). This EUA will remain in effect (meaning this test can be used) for the duration of the COVID-19 declaration under Section 564(b)(1) of the Act, 21 U.S.C. section 360bbb-3(b)(1), unless the authorization is terminated or revoked.  Performed at [facility], [place] [address], [place], [place] [address]    WBC 04/11/2022 13.9 (H)  4.0 - 10.5 K/uL Final   RBC 04/11/2022 4.37  4.22 - 5.81 MIL/uL Final   Hemoglobin 04/11/2022 13.0  13.0 - 17.0 g/dL Final   HCT 04/11/2022 40.0  39.0 - 52.0 % Final   MCV 04/11/2022 91.5  80.0 - 100.0 fL Final   MCH 04/11/2022 29.7  26.0 - 34.0 pg Final   MCHC 04/11/2022 32.5  30.0 - 36.0 g/dL Final   RDW 04/11/2022 15.1  11.5 - 15.5 % Final   Platelets 04/11/2022 322  150 - 400 K/uL Final   nRBC 04/11/2022 0.0  0.0 - 0.2 % Final   Neutrophils Relative % 04/11/2022 71  % Final   Neutro Abs 04/11/2022 9.9 (H)  1.7 - 7.7 K/uL Final   Lymphocytes Relative 04/11/2022 18  % Final   Lymphs Abs 04/11/2022 2.5  0.7 - 4.0 K/uL Final   Monocytes Relative 04/11/2022 8  % Final   Monocytes Absolute 04/11/2022 1.1 (H)  0.1 - 1.0 K/uL Final   Eosinophils Relative 04/11/2022 2  % Final   Eosinophils Absolute 04/11/2022 0.3  0.0 - 0.5 K/uL Final   Basophils Relative 04/11/2022 0  % Final   Basophils Absolute 04/11/2022 0.0  0.0 - 0.1 K/uL Final   Immature Granulocytes 04/11/2022 1  % Final   Abs Immature Granulocytes 04/11/2022 0.07  0.00 - 0.07 K/uL Final   Performed at [facility], [place] [address], [place], [place] [address]   Sodium 04/11/2022 138  135 - 145 mmol/L Final   Potassium 04/11/2022 4.3  3.5 - 5.1 mmol/L Final   Chloride 04/11/2022 104  98 - 111 mmol/L Final   CO2 04/11/2022 24  22 - 32 mmol/L Final   Glucose, Bld 04/11/2022 80  70 - 99 mg/dL Final   Glucose reference range  applies only to samples taken after fasting for at least 8 hours.   BUN 04/11/2022 12  6 - 20 mg/dL Final   Creatinine, Ser 04/11/2022 1.17  0.61 - 1.24 mg/dL Final   Calcium 04/11/2022 9.2  8.9 - 10.3 mg/dL Final   Total Protein 04/11/2022 6.9  6.5 - 8.1 g/dL Final   Albumin 04/11/2022 3.9  3.5 - 5.0 g/dL Final   AST 04/11/2022 33  15 - 41 U/L Final   ALT 04/11/2022 47 (H)  0 - 44 U/L Final   Alkaline Phosphatase 04/11/2022 69  38 - 126 U/L Final   Total Bilirubin 04/11/2022 0.5  0.3 - 1.2 mg/dL Final   GFR, Estimated 04/11/2022 >60  >60 mL/min Final   Comment: (NOTE) Calculated using the CKD-EPI Creatinine Equation (2021)    Anion gap 04/11/2022 10  5 - 15 Final   Performed at [place] [address] [place], [place] [address]   POC Amphetamine UR 04/11/2022 None Detected  NONE DETECTED (Cut Off Level 1000 ng/mL) Final   POC Secobarbital (BAR) 04/11/2022 None Detected  NONE DETECTED (Cut Off Level 300 ng/mL) Final   POC Buprenorphine (BUP) 04/11/2022 None Detected  NONE DETECTED (Cut Off Level 10 ng/mL) Final   POC Oxazepam (BZO) 04/11/2022 None Detected  NONE DETECTED (Cut Off Level 300 ng/mL) Final   POC Cocaine UR 04/11/2022 None Detected  NONE DETECTED (Cut Off Level 300 ng/mL) Final   POC Methamphetamine UR 04/11/2022 None Detected  NONE DETECTED (Cut Off Level 1000 ng/mL) Final   POC Morphine 04/11/2022 None Detected  NONE DETECTED (Cut Off Level 300 ng/mL) Final   POC Methadone UR 04/11/2022 None Detected  NONE DETECTED (Cut Off Level 300 ng/mL) Final  POC Oxycodone UR 04/11/2022 None Detected  NONE DETECTED (Cut Off Level 100 ng/mL) Final   POC Marijuana UR 04/11/2022 None Detected  NONE DETECTED (Cut Off Level 50 ng/mL) Final  Admission on 03/30/2022, Discharged on 04/01/2022  Component Date Value Ref Range Status   SARS Coronavirus 2 by RT PCR 03/30/2022 NEGATIVE  NEGATIVE Final   Comment: (NOTE) SARS-CoV-2 target nucleic acids are NOT DETECTED.  The  SARS-CoV-2 RNA is generally detectable in upper respiratory specimens during the acute phase of infection. The lowest concentration of SARS-CoV-2 viral copies this assay can detect is 138 copies/mL. A negative result does not preclude SARS-Cov-2 infection and should not be used as the sole basis for treatment or other patient management decisions. A negative result may occur with  improper specimen collection/handling, submission of specimen other than nasopharyngeal swab, presence of viral mutation(s) within the areas targeted by this assay, and inadequate number of viral copies(<138 copies/mL). A negative result must be combined with clinical observations, patient history, and epidemiological information. The expected result is Negative.  Fact Sheet for Patients:  EntrepreneurPulse.com.au  Fact Sheet for Healthcare Providers:  IncredibleEmployment.be  This test is no                          t yet approved or cleared by the [place] FDA and  has been authorized for detection and/or diagnosis of SARS-CoV-2 by FDA under an Emergency Use Authorization (EUA). This EUA will remain  in effect (meaning this test can be used) for the duration of the COVID-19 declaration under Section 564(b)(1) of the Act, 21 U.S.C.section 360bbb-3(b)(1), unless the authorization is terminated  or revoked sooner.       Influenza A by PCR 03/30/2022 NEGATIVE  NEGATIVE Final   Influenza B by PCR 03/30/2022 NEGATIVE  NEGATIVE Final   Comment: (NOTE) The Xpert Xpress SARS-CoV-2/FLU/RSV plus assay is intended as an aid in the diagnosis of influenza from Nasopharyngeal swab specimens and should not be used as a sole basis for treatment. Nasal washings and aspirates are unacceptable for Xpert Xpress SARS-CoV-2/FLU/RSV testing.  Fact Sheet for Patients: EntrepreneurPulse.com.au  Fact Sheet for Healthcare  Providers: IncredibleEmployment.be  This test is not yet approved or cleared by the [place] FDA and has been authorized for detection and/or diagnosis of SARS-CoV-2 by FDA under an Emergency Use Authorization (EUA). This EUA will remain in effect (meaning this test can be used) for the duration of the COVID-19 declaration under Section 564(b)(1) of the Act, 21 U.S.C. section 360bbb-3(b)(1), unless the authorization is terminated or revoked.  Performed at [facility], [place] [address], [place], [place] [address]    POC Amphetamine UR 03/30/2022 Positive (A)  NONE DETECTED (Cut Off Level 1000 ng/mL) Final   POC Secobarbital (BAR) 03/30/2022 None Detected  NONE DETECTED (Cut Off Level 300 ng/mL) Final   POC Buprenorphine (BUP) 03/30/2022 None Detected  NONE DETECTED (Cut Off Level 10 ng/mL) Final   POC Oxazepam (BZO) 03/30/2022 None Detected  NONE DETECTED (Cut Off Level 300 ng/mL) Final   POC Cocaine UR 03/30/2022 None Detected  NONE DETECTED (Cut Off Level 300 ng/mL) Final   POC Methamphetamine UR 03/30/2022 Positive (A)  NONE DETECTED (Cut Off Level 1000 ng/mL) Final   POC Morphine 03/30/2022 None Detected  NONE DETECTED (Cut Off Level 300 ng/mL) Final   POC Methadone UR 03/30/2022 None Detected  NONE DETECTED (Cut Off Level 300 ng/mL) Final   POC Oxycodone  UR 03/30/2022 None Detected  NONE DETECTED (Cut Off Level 100 ng/mL) Final   POC Marijuana UR 03/30/2022 Positive (A)  NONE DETECTED (Cut Off Level 50 ng/mL) Final   SARSCOV2ONAVIRUS 2 AG 03/30/2022 NEGATIVE  NEGATIVE Final   Comment: (NOTE) SARS-CoV-2 antigen NOT DETECTED.   Negative results are presumptive.  Negative results do not preclude SARS-CoV-2 infection and should not be used as the sole basis for treatment or other patient management decisions, including infection  control decisions, particularly in the presence of clinical signs and  symptoms consistent with COVID-19, or in those who have  been in contact with the virus.  Negative results must be combined with clinical observations, patient history, and epidemiological information. The expected result is Negative.  Fact Sheet for Patients: HandmadeRecipes.com.cy  Fact Sheet for Healthcare Providers: FuneralLife.at  This test is not yet approved or cleared by the [place] FDA and  has been authorized for detection and/or diagnosis of SARS-CoV-2 by FDA under an Emergency Use Authorization (EUA).  This EUA will remain in effect (meaning this test can be used) for the duration of  the COV                          ID-19 declaration under Section 564(b)(1) of the Act, 21 U.S.C. section 360bbb-3(b)(1), unless the authorization is terminated or revoked sooner.    Admission on 03/29/2022, Discharged on 03/30/2022  Component Date Value Ref Range Status   Sodium 03/29/2022 142  135 - 145 mmol/L Final   Potassium 03/29/2022 3.5  3.5 - 5.1 mmol/L Final   Chloride 03/29/2022 110  98 - 111 mmol/L Final   CO2 03/29/2022 24  22 - 32 mmol/L Final   Glucose, Bld 03/29/2022 108 (H)  70 - 99 mg/dL Final   Glucose reference range applies only to samples taken after fasting for at least 8 hours.   BUN 03/29/2022 13  6 - 20 mg/dL Final   Creatinine, Ser 03/29/2022 1.19  0.61 - 1.24 mg/dL Final   Calcium 03/29/2022 9.2  8.9 - 10.3 mg/dL Final   Total Protein 03/29/2022 7.7  6.5 - 8.1 g/dL Final   Albumin 03/29/2022 4.2  3.5 - 5.0 g/dL Final   AST 03/29/2022 61 (H)  15 - 41 U/L Final   ALT 03/29/2022 48 (H)  0 - 44 U/L Final   Alkaline Phosphatase 03/29/2022 64  38 - 126 U/L Final   Total Bilirubin 03/29/2022 0.9  0.3 - 1.2 mg/dL Final   GFR, Estimated 03/29/2022 >60  >60 mL/min Final   Comment: (NOTE) Calculated using the CKD-EPI Creatinine Equation (2021)    Anion gap 03/29/2022 8  5 - 15 Final   Performed at [facility], [place] [address], [place], [place] [address]    Alcohol, Ethyl (B) 03/29/2022 <10  <10 mg/dL Final   Comment: (NOTE) Lowest detectable limit for serum alcohol is 10 mg/dL.  For medical purposes only. Performed at [facility], [place] [address], [place], [place] [address]    Salicylate Lvl 67/34/1939 <7.0 (L)  7.0 - 30.0 mg/dL Final   Performed at [place] [address] [place], [place] [address]   Acetaminophen (Tylenol), Serum 03/29/2022 <10 (L)  10 - 30 ug/mL Final   Comment: (NOTE) Therapeutic concentrations vary significantly. A range of 10-30 ug/mL  may be an effective concentration for many patients. However, some  are best treated at concentrations outside of this  range. Acetaminophen concentrations >150 ug/mL at 4 hours after ingestion  and >50 ug/mL at 12 hours after ingestion are often associated with  toxic reactions.  Performed at [facility], [place] [address], [place], [place] [address]    WBC 03/29/2022 7.5  4.0 - 10.5 K/uL Final   RBC 03/29/2022 4.21 (L)  4.22 - 5.81 MIL/uL Final   Hemoglobin 03/29/2022 12.2 (L)  13.0 - 17.0 g/dL Final   HCT 03/29/2022 38.1 (L)  39.0 - 52.0 % Final   MCV 03/29/2022 90.5  80.0 - 100.0 fL Final   MCH 03/29/2022 29.0  26.0 - 34.0 pg Final   MCHC 03/29/2022 32.0  30.0 - 36.0 g/dL Final   RDW 03/29/2022 15.3  11.5 - 15.5 % Final   Platelets 03/29/2022 228  150 - 400 K/uL Final   nRBC 03/29/2022 0.0  0.0 - 0.2 % Final   Performed at [facility], [place] [address] [place], [place] [address]   Opiates 03/29/2022 NONE DETECTED  NONE DETECTED Final   Cocaine 03/29/2022 NONE DETECTED  NONE DETECTED Final   Benzodiazepines 03/29/2022 NONE DETECTED  NONE DETECTED Final   Amphetamines 03/29/2022 POSITIVE (A)  NONE DETECTED Final   Tetrahydrocannabinol 03/29/2022 POSITIVE (A)  NONE DETECTED Final   Barbiturates 03/29/2022 NONE DETECTED  NONE DETECTED Final   Comment: (NOTE) DRUG SCREEN FOR MEDICAL  PURPOSES ONLY.  IF CONFIRMATION IS NEEDED FOR ANY PURPOSE, NOTIFY LAB WITHIN 5 DAYS.  LOWEST DETECTABLE LIMITS FOR URINE DRUG SCREEN Drug Class                     Cutoff (ng/mL) Amphetamine and metabolites    1000 Barbiturate and metabolites    200 Benzodiazepine                 308 Tricyclics and metabolites     300 Opiates and metabolites        300 Cocaine and metabolites        300 THC                            50 Performed at [facility], [place] [address], [place], [place] [address]    Troponin I (High Sensitivity) 03/29/2022 3  <18 ng/L Final   Comment: (NOTE) Elevated high sensitivity troponin I (hsTnI) values and significant  changes across serial measurements may suggest ACS but many other  chronic and acute conditions are known to elevate hsTnI results.  Refer to the "Links" section for chest pain algorithms and additional  guidance. Performed at [facility], [place] [address] [place], [place] [address]    SARS Coronavirus 2 by RT PCR 03/29/2022 NEGATIVE  NEGATIVE Final   Comment: (NOTE) SARS-CoV-2 target nucleic acids are NOT DETECTED.  The SARS-CoV-2 RNA is generally detectable in upper and lower respiratory specimens during the acute phase of infection. The lowest concentration of SARS-CoV-2 viral copies this assay can detect is 250 copies / mL. A negative result does not preclude SARS-CoV-2 infection and should not be used as the sole basis for treatment or other patient management decisions.  A negative result may occur with improper specimen collection / handling, submission of specimen other than nasopharyngeal swab, presence of viral mutation(s) within the areas targeted by this assay, and inadequate number of viral copies (<250 copies / mL). A negative result must be combined with clinical observations, patient history, and  epidemiological information.  Fact Sheet for Patients:    https://www.patel.info/  Fact Sheet for Healthcare Providers: https://hall.com/  This test is not yet approved or                           cleared by the [place] FDA and has been authorized for detection and/or diagnosis of SARS-CoV-2 by FDA under an Emergency Use Authorization (EUA).  This EUA will remain in effect (meaning this test can be used) for the duration of the COVID-19 declaration under Section 564(b)(1) of the Act, 21 U.S.C. section 360bbb-3(b)(1), unless the authorization is terminated or revoked sooner.  Performed at [facility], [place] [address], [place], [place] [address]    SARS Coronavirus 2 by RT PCR 03/30/2022 NEGATIVE  NEGATIVE Final   Comment: (NOTE) SARS-CoV-2 target nucleic acids are NOT DETECTED.  The SARS-CoV-2 RNA is generally detectable in upper respiratory specimens during the acute phase of infection. The lowest concentration of SARS-CoV-2 viral copies this assay can detect is 138 copies/mL. A negative result does not preclude SARS-Cov-2 infection and should not be used as the sole basis for treatment or other patient management decisions. A negative result may occur with  improper specimen collection/handling, submission of specimen other than nasopharyngeal swab, presence of viral mutation(s) within the areas targeted by this assay, and inadequate number of viral copies(<138 copies/mL). A negative result must be combined with clinical observations, patient history, and epidemiological information. The expected result is Negative.  Fact Sheet for Patients:  EntrepreneurPulse.com.au  Fact Sheet for Healthcare Providers:  IncredibleEmployment.be  This test is no                          t yet approved or cleared by the [place] FDA and  has been authorized for detection and/or diagnosis of SARS-CoV-2 by FDA under an Emergency Use  Authorization (EUA). This EUA will remain  in effect (meaning this test can be used) for the duration of the COVID-19 declaration under Section 564(b)(1) of the Act, 21 U.S.C.section 360bbb-3(b)(1), unless the authorization is terminated  or revoked sooner.       Influenza A by PCR 03/30/2022 NEGATIVE  NEGATIVE Final   Influenza B by PCR 03/30/2022 NEGATIVE  NEGATIVE Final   Comment: (NOTE) The Xpert Xpress SARS-CoV-2/FLU/RSV plus assay is intended as an aid in the diagnosis of influenza from Nasopharyngeal swab specimens and should not be used as a sole basis for treatment. Nasal washings and aspirates are unacceptable for Xpert Xpress SARS-CoV-2/FLU/RSV testing.  Fact Sheet for Patients: EntrepreneurPulse.com.au  Fact Sheet for Healthcare Providers: IncredibleEmployment.be  This test is not yet approved or cleared by the [place] FDA and has been authorized for detection and/or diagnosis of SARS-CoV-2 by FDA under an Emergency Use Authorization (EUA). This EUA will remain in effect (meaning this test can be used) for the duration of the COVID-19 declaration under Section 564(b)(1) of the Act, 21 U.S.C. section 360bbb-3(b)(1), unless the authorization is terminated or revoked.  Performed at [facility], [place] [address], [place], [place] [address]     Allergies: Fish allergy and Shellfish allergy  PTA Medications: (Not in a hospital admission)  No current facility-administered medications on file prior to encounter.   Current Outpatient Medications on File Prior to Encounter  Medication Sig Dispense Refill   buPROPion (WELLBUTRIN XL) 300 MG 24 hr tablet Take 1 tablet (300 mg  total) by mouth daily. 30 tablet 1   gabapentin (NEURONTIN) 300 MG capsule Take 1 cap PO BID for one week, then take 1 cap PO qhs for one week, then d/c 21 capsule 0   hydrOXYzine (ATARAX) 25 MG tablet Take 1 tablet (25 mg total) by mouth 3  (three) times daily as needed for anxiety. 90 tablet 1   isosorbide mononitrate (IMDUR) 30 MG 24 hr tablet Take 0.5 tablets (15 mg total) by mouth daily. 15 tablet 1   LIPITOR 80 MG tablet Take 1 tablet (80 mg total) by mouth at bedtime. 30 tablet 1   QUEtiapine (SEROQUEL) 50 MG tablet Take 2 tabs PO qhs for one week , then take 1 tab PO qhs for one week, then D/C 21 tablet 0   [DISCONTINUED] FLUoxetine (PROZAC) 20 MG capsule Take 1 capsule (20 mg total) by mouth daily. For depression (Patient not taking: Reported on 11/04/2019) 30 capsule 0   [DISCONTINUED] sucralfate (CARAFATE) 1 g tablet Take 1 tablet (1 g total) by mouth 4 (four) times daily -  with meals and at bedtime. (Patient not taking: Reported on 11/04/2019) 60 tablet 0     Medical Decision Making  Cable Fearn is a 36 y/o single male with a history of bipolar disorder, suicidal ideation, polysubstance abuse, heroin, methamphetamine and cannabis use presenting voluntarily to [facility] to detox from methamphetamine use.    Recommendations  Based on my evaluation the patient does not appear to have an emergency medical condition. Patient will be admitted to [facility]  continuous observation unit for crisis management, safety and stabilization.   Lucia Bitter, NP 07/06/22  6:05 AM

## 2022-07-06 NOTE — ED Notes (Signed)
Pt reports no pain this morning.  When speaking with this writer pt would stand up then sit down again several  time, reports restlessness.  He denies SI, HI, and AVH.  Breathing is even and unlabored.  Will continue to monitor for safety.

## 2022-07-06 NOTE — ED Notes (Signed)
Pt pacing unit with blanket over shoulders mumbling to self.  He reports feeling things crawling on his skin and continue to report a headache.  Provider notified

## 2022-07-06 NOTE — Progress Notes (Signed)
07/06/22 0112  [place] Triage Screening (Walk-ins at [facility] only)  How Did You Hear About [place]? Family/Friend  What Is the Reason for Your Visit/Call Today? Pt presents to [facility] unaccompanied. Pt reports that his friend transported him to the [facility] from attending "The Last call NA Meeting." Pt reports that surprisingly his girlfriend and her boss were at the meeting. Pt states that he thinks his girlfriend has something going on with her boss. Pt states that he is exhibiting some HI towards girlfriend's boss. Pt admits to SI without a plan but states he no longer wants to live like this. Pt admits to using Meth which he reports is his drug of choice. Pt also shares that he has been abusing suboxone which he was prescribed when he was once at [facility] but not currently prescribed. Pt shares that this most recent relapse stems from pt abusing his Wellbutrin by Intranasal use this past Saturday and then on Monday started using Meth. Pt shared he had 97 days clean. Pt reports that he feels he has some unresolved grieving from the loss of his grandmother that passed last year on his birthday [birth year]. Pt denies AVH and paranoia. Pt appears anxious and exhibits signs of intoxication under the influence of Meth and evident by pt's verbally sharing with this CSW and pt actively picking his skin. Pt also reports that his nose burns from skin picking. [facility], along with multiple other inpatient stays. Pt reports he does not feel safe to be alone. Pt states he was honest and can not return back to the [facility] until he has 2 weeks of clean time. Pt's support system consist of his best friend, and NA family. Pt has an estrange relationship with his family however pt's son is being raised by maternal grandmother. Pt states he speaks with his mother at times but he is not close with his mother or father. Pt shares he has a closer relationship with his sister but she lives  with his parents. Pt also reports that he first used drugs with his parents at age 28 but shares that they no longer use but did not truly support his recovery or want to be involved. CSW staffed case with provider Quintella Reichert, NP and pt is Emergent and has been accepted for continue assessment per Quintella Reichert, NP and [facility] Orlando Penner, RN.  How Long Has This Been Causing You Problems? 1 wk - 1 month  Have You Recently Had Any Thoughts About Hurting Yourself? Yes  How long ago did you have thoughts about hurting yourself? Today  Are You Planning to Commit Suicide/Harm Yourself At This time? Yes ("Yes I just don't want to be here like this.")  Have you Recently Had Thoughts About [place]? Yes  How long ago did you have thoughts of harming others? "Today when I saw my girfriend with his boss, I asked him are you messing with my girl."  Are You Planning To Harm Someone At This Time? Yes  Explanation: "Girlfriend's boss."  Are you currently experiencing any auditory, visual or other hallucinations? No  Have You Used Any Alcohol or Drugs in the Past 24 Hours? Yes  How long ago did you use Drugs or Alcohol? Meth and Suboxone  What Did You Use and How Much? Unknown  Do you have any current medical co-morbidities that require immediate attention? No  Clinician description of patient physical appearance/behavior: Pt  exhibits behaviors of being under the influence of substance as evident by restlessness, and skin picking. Pt is present and was willing to speak with this CSW.  What Do You Feel Would Help You the Most Today? Alcohol or Drug Use Treatment;Treatment for Depression or other mood problem;Housing Assistance  If access to [facility] Urgent Care was not available, would you have sought care in the Emergency Department? Yes  Determination of Need Emergent (2 hours)  Options For Referral Chemical Dependency Intensive Outpatient Therapy (CDIOP);Intensive Outpatient Therapy;Medication  Management;Mobile Crisis;Facility-Based Crisis   Benjaman Kindler, MSW, [facility] 07/06/2022 3:02 AM

## 2022-07-06 NOTE — ED Notes (Signed)
Patient is breaking out in what looks like hives on his trunk and his arms - provider made aware

## 2022-07-06 NOTE — ED Notes (Signed)
Pt is maniac, loud and acting very bizarre tonight. He is redirectable will continue to monitor for safety

## 2022-07-06 NOTE — ED Notes (Signed)
Pt asleep.  Breathing is even and unlabored will continue to monitor for safety.

## 2022-07-06 NOTE — ED Notes (Signed)
Pt presents to [facility] unaccompanied for detox. Pt states that he is exhibiting some HI towards girlfriend's boss. Pt admits to SI without a plan but states he no longer wants to live like this. Pt admits to using Meth which he reports is his drug of choice. Pt also shares that he has been abusing suboxone which he was prescribed when he was once at [facility] but not currently prescribed. Pt shares that this most recent relapse stems from pt abusing his Wellbutrin by Intranasal use this past Saturday and then on Monday started using Meth. Pt denies AVH and paranoia. Pt appears anxious and exhibits signs of intoxication under the influence of Meth and evident by pt's verbally sharing with staff and pt actively picking his skin. Pt also reports that his nose burns from skin picking. Pt has [facility] to [facility], along with multiple other inpatient stays. Pt reports he does not feel safe to be alone. Pt states he was honest and can not return back to the [facility] until he has 2 weeks of clean time. Skin assessment completed, medications given to the patient, now in bed trying to sleep. Will continue to monitor and update as needed

## 2022-07-06 NOTE — ED Notes (Addendum)
Pt has hives to stomach and bilateral legs. Pt states hives began when he got to [facility] and he believes it is from the clothes he has on. Leandro Reasoner, NP notified. Pt requested for staff to get new clothes out of his locker so that he could change. MHT went to locker to get pt's clothes. Pt became very upset when MHT got back to unit, stating staff should have allowed him to go back into the locker himself. RN advised pt that he is not allowed to go back into locker himself. Pt remains irritated. Orlando Penner, RN/AC made aware.

## 2022-07-06 NOTE — ED Notes (Signed)
NP to unit to see pt. Pt adamant that he has not taken anything since he has been here other than prescribed medications that were given to him. NP placed orders for PRN hydrocortisone cream for pt's abdomen and bilateral legs.

## 2022-07-06 NOTE — ED Notes (Addendum)
Pt asleep in room while sitting on floor, back propped up against bed and leaning towards right side. Vital signs obtained. Respirations even and unlabored. No signs of distress noted. Staff assisted pt to get in bed to lay down. When asked when is the last time pt used substances, pt stated "here." Pt then stated, "it's what y'all gave me." Leandro Reasoner, NP notified of pt's behaviors and statements. Monitoring for safety.
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
## 2022-07-06 NOTE — ED Notes (Addendum)
Pt admitted to [facility] due to SI and substance use. Pt ambulated independently to unit. Oriented to unit/staff. Pt A&O x4, irritable but cooperative. Pt has rapid, slurred speech and difficulty sitting still. Pt tolerated skin assessment well but nodded off to sleep while sitting at dining room table several times while completing admission paperwork. Pt asks "you didn't put that I'm suicidal right?" while completing paperwork. Pt denies current SI/HI/AVH. Snack and juice provided per pt request. No signs of distress noted. Monitoring for safety.

## 2022-07-06 NOTE — ED Provider Notes (Signed)
Behavioral Health Progress Note  Date and Time: 07/06/2022 10:39 AM Name: Brian [name] MRN:  [id number]  Subjective: Brian [name] is a 36 y/o single male with a history of bipolar disorder, suicidal ideation, polysubstance abuse, heroin, methamphetamine and cannabis use presenting voluntarily to [facility] to detox from methamphetamine use. Patient reports that he started using methamphetamine on Monday and last weekend he started snorting Wellbutrin. UDS is positive for amphetamines, opiates, cocaine and methamphetamines.  Today, patient seen and evaluated face-to-face by this provider, and chart reviewed.  On evaluation, patient is alert and oriented x 4. His thought process is linear. His speech is pressured and rapid. His mood is anxious and affect is congruent. He is noted to be fidgety and restless throughout the assessment. He states that he has only slept for about 1 hour. States that he relapsed on meth on Tuesday and has been snorting his mental health medications Wellbutrin. He states that he has been using meth his whole life, 8 to 10 years but he started back using on Tuesday. He reports only snorting Wellbutrin for 1 day from Saturday to Sunday. He denies drinking alcohol. He states that he is grieving the loss of his grandmother and that his grandmother died on his birthday. Patient's birthday noted to be [birth year]. It is unclear as to what year his grandmother passed away. He states that he never cried when his grandmother died and that he has been having weird feelings. However, he denies depressive symptoms. He denies SI/HI. He denies AVH. There is no objective evidence that the patient is currently responding to internal or external stimuli. He reports a fair appetite.  He reports withdrawal symptoms of sweats, nausea, anxiety and racing thoughts. He denies medication side effects this time. He reports a past psychiatric history of bipolar manic. He states that in the past he has been  prescribed Wellbutrin, gabapentin, Seroquel, Latuda, and Remeron. Per chart review, on 04/11/22 at the [facility] behavioral health urgent care, patient was discharged home on Seroquel 200 mg nightly, gabapentin 300 mg 3 times daily, Wellbutrin 300 mg p.o. daily, Lipitor 80 mg p.o. daily and Imdur 15 mg p.o. daily.    Diagnosis:  Final diagnoses:  Bipolar disorder, current episode depressed, severe, without psychotic features ([place])  Addiction ([place])  Suicidal ideation    Total Time spent with patient: 20 minutes  Past Psychiatric History: History of bipolar, polysubstance abuse, cocaine abuse, methamphetamine abuse and anxiety.  Past Medical History:  Past Medical History:  Diagnosis Date   Alcohol abuse    Anxiety    Bipolar 2 disorder ([place])    Cocaine abuse ([place])    Depression    Kidney stones    Methamphetamine abuse, episodic ([facility])    Myocardial infarction ([place])    Narcotic abuse ([place])    Peptic ulcer    Polysubstance abuse ([place])    Renal disorder    kidney stones    Past Surgical History:  Procedure Laterality Date   ESOPHAGOGASTRODUODENOSCOPY N/A 05/29/2014   Procedure: ESOPHAGOGASTRODUODENOSCOPY (EGD) ;  Surgeon: Beryle Beams, MD;  Location: [facility] ENDOSCOPY;  Service: Endoscopy;  Laterality: N/A;  check with Dr. Collene Mares about sedation type/timinng - I recommend MAC   HAND SURGERY  2006   LEFT HEART CATH AND CORONARY ANGIOGRAPHY N/A 06/21/2020   Procedure: LEFT HEART CATH AND CORONARY ANGIOGRAPHY;  Surgeon: Nelva Bush, MD;  Location: [facility];  Service: Cardiovascular;  Laterality: N/A;   Family History:  Family History  Problem Relation Age of Onset   Heart disease Father    Family Psychiatric  History: No history reported. Social History:  Social History   Substance and Sexual Activity  Alcohol Use Not Currently     Social History   Substance and Sexual Activity  Drug Use Yes   Types: IV, Methamphetamines    Social History   Socioeconomic  History   Marital status: Single    Spouse name: Not on file   Number of children: Not on file   Years of education: Not on file   Highest education level: Not on file  Occupational History   Not on file  Tobacco Use   Smoking status: Every Day    Packs/day: 0.50    Types: Cigarettes   Smokeless tobacco: Never  Vaping Use   Vaping Use: Never used  Substance and Sexual Activity   Alcohol use: Not Currently   Drug use: Yes    Types: IV, Methamphetamines   Sexual activity: Not Currently  Other Topics Concern   Not on file  Social History Narrative   ** Merged History Encounter **       Lives with a friend   Lost house and custody of son 9broke up with fiancee)   Nature conservation officer   Social Determinants of Health   Financial Resource Strain: Not on file  Food Insecurity: Not on file  Transportation Needs: Not on file  Physical Activity: Not on file  Stress: Not on file  Social Connections: Not on file   SDOH:  [place]: High Risk (08/09/2020)  Alcohol Screen: Low Risk  (12/21/2020)  Depression (PHQ2-9): Medium Risk (07/06/2022)  Tobacco Use: High Risk (07/04/2022)   Additional Social History:    Pain Medications: Pt has history of abusing opiates Prescriptions: Gabapentin 369m 3x/D; Seroquel 2040mat night.  Hydrozyzine, Lipitor Has not taken prescription meds since 09/05 Over the Counter: None History of alcohol / drug use?: Yes Longest period of sobriety (when/how long): 4 years Name of Substance 1: Suboxone 1 - Amount (size/oz): 62m9m day starting 07/01/22 1 - Frequency: Daily 1 - Duration: First time in 97 days 1 - Last Use / Amount: 09/09 around 21:30 1 - Method of Aquiring: purchase 1- Route of Use: orally Name of Substance 2: Methamphetamines 2 - Amount (size/oz): 1 gram (eats it) 2 - Frequency: Daily 2 - Duration: Tuesday, 07/01/22 first use in 97 days 2 - Last Use / Amount: 09/09 around 21:00 2 - Method of Aquiring: purchase 2 -  Route of Substance Use: eating    Current Medications:  Current Facility-Administered Medications  Medication Dose Route Frequency Provider Last Rate Last Admin   acetaminophen (TYLENOL) tablet 650 mg  650 mg Oral Q6H PRN Bobbitt, Shalon E, NP   650 mg at 07/06/22 0916   alum & mag hydroxide-simeth (MAALOX/MYLANTA) 200-200-20 MG/5ML suspension 30 mL  30 mL Oral Q4H PRN Bobbitt, Shalon E, NP       cloNIDine (CATAPRES) tablet 0.1 mg  0.1 mg Oral QID Bobbitt, Shalon E, NP   0.1 mg at 07/06/22 0914   Followed by   [STDerrill Memo 07/08/2022] cloNIDine (CATAPRES) tablet 0.1 mg  0.1 mg Oral BH-qamhs Bobbitt, Shalon E, NP       Followed by   [STDerrill Memo 07/11/2022] cloNIDine (CATAPRES) tablet 0.1 mg  0.1 mg Oral QAC breakfast Bobbitt, Shalon E, NP       dicyclomine (BENTYL) tablet 20 mg  20 mg Oral  Q6H PRN Bobbitt, Lennie Muckle, NP   20 mg at 07/06/22 0915   hydrOXYzine (ATARAX) tablet 25 mg  25 mg Oral Q6H PRN Bobbitt, Shalon E, NP   25 mg at 07/06/22 0413   loperamide (IMODIUM) capsule 2-4 mg  2-4 mg Oral PRN Bobbitt, Shalon E, NP       LORazepam (ATIVAN) tablet 1 mg  1 mg Oral Q4H PRN Lanett Lasorsa L, NP       magnesium hydroxide (MILK OF MAGNESIA) suspension 30 mL  30 mL Oral Daily PRN Bobbitt, Shalon E, NP       methocarbamol (ROBAXIN) tablet 500 mg  500 mg Oral Q8H PRN Bobbitt, Shalon E, NP   500 mg at 07/06/22 0413   naproxen (NAPROSYN) tablet 500 mg  500 mg Oral BID PRN Bobbitt, Shalon E, NP   500 mg at 07/06/22 0413   neomycin-bacitracin-polymyxin [id number] OINT 1 Application  1 Application Topical TID PRN Bobbitt, Shalon E, NP   1 Application at 33/82/50 0412   OLANZapine zydis (ZYPREXA) disintegrating tablet 5 mg  5 mg Oral Q8H PRN Ayjah Show L, NP       ondansetron (ZOFRAN-ODT) disintegrating tablet 4 mg  4 mg Oral Q6H PRN Bobbitt, Shalon E, NP       traZODone (DESYREL) tablet 50 mg  50 mg Oral QHS PRN Bobbitt, Shalon E, NP       Current Outpatient Medications  Medication Sig Dispense Refill    buPROPion (WELLBUTRIN XL) 300 MG 24 hr tablet Take 1 tablet (300 mg total) by mouth daily. 30 tablet 1   gabapentin (NEURONTIN) 300 MG capsule Take 1 cap PO BID for one week, then take 1 cap PO qhs for one week, then d/c 21 capsule 0   hydrOXYzine (ATARAX) 25 MG tablet Take 1 tablet (25 mg total) by mouth 3 (three) times daily as needed for anxiety. 90 tablet 1   isosorbide mononitrate (IMDUR) 30 MG 24 hr tablet Take 0.5 tablets (15 mg total) by mouth daily. 15 tablet 1   LIPITOR 80 MG tablet Take 1 tablet (80 mg total) by mouth at bedtime. 30 tablet 1   QUEtiapine (SEROQUEL) 50 MG tablet Take 2 tabs PO qhs for one week , then take 1 tab PO qhs for one week, then D/C 21 tablet 0    Labs  Lab Results:  Admission on 07/06/2022  Component Date Value Ref Range Status   SARS Coronavirus 2 by RT PCR 07/06/2022 NEGATIVE  NEGATIVE Final   Comment: (NOTE) SARS-CoV-2 target nucleic acids are NOT DETECTED.  The SARS-CoV-2 RNA is generally detectable in upper respiratory specimens during the acute phase of infection. The lowest concentration of SARS-CoV-2 viral copies this assay can detect is 138 copies/mL. A negative result does not preclude SARS-Cov-2 infection and should not be used as the sole basis for treatment or other patient management decisions. A negative result may occur with  improper specimen collection/handling, submission of specimen other than nasopharyngeal swab, presence of viral mutation(s) within the areas targeted by this assay, and inadequate number of viral copies(<138 copies/mL). A negative result must be combined with clinical observations, patient history, and epidemiological information. The expected result is Negative.  Fact Sheet for Patients:  EntrepreneurPulse.com.au  Fact Sheet for Healthcare Providers:  IncredibleEmployment.be  This test is no                          t yet approved or cleared  by the [place] and   has been authorized for detection and/or diagnosis of SARS-CoV-2 by FDA under an Emergency Use Authorization (EUA). This EUA will remain  in effect (meaning this test can be used) for the duration of the COVID-19 declaration under Section 564(b)(1) of the Act, 21 U.S.C.section 360bbb-3(b)(1), unless the authorization is terminated  or revoked sooner.       Influenza A by PCR 07/06/2022 NEGATIVE  NEGATIVE Final   Influenza B by PCR 07/06/2022 NEGATIVE  NEGATIVE Final   Comment: (NOTE) The Xpert Xpress SARS-CoV-2/FLU/RSV plus assay is intended as an aid in the diagnosis of influenza from Nasopharyngeal swab specimens and should not be used as a sole basis for treatment. Nasal washings and aspirates are unacceptable for Xpert Xpress SARS-CoV-2/FLU/RSV testing.  Fact Sheet for Patients: EntrepreneurPulse.com.au  Fact Sheet for Healthcare Providers: IncredibleEmployment.be  This test is not yet approved or cleared by the [place] FDA and has been authorized for detection and/or diagnosis of SARS-CoV-2 by FDA under an Emergency Use Authorization (EUA). This EUA will remain in effect (meaning this test can be used) for the duration of the COVID-19 declaration under Section 564(b)(1) of the Act, 21 U.S.C. section 360bbb-3(b)(1), unless the authorization is terminated or revoked.  Performed at [facility], [place] [address] [place], [place] [address]    WBC 07/06/2022 10.2  4.0 - 10.5 K/uL Final   RBC 07/06/2022 4.50  4.22 - 5.81 MIL/uL Final   Hemoglobin 07/06/2022 13.4  13.0 - 17.0 g/dL Final   HCT 07/06/2022 39.3  39.0 - 52.0 % Final   MCV 07/06/2022 87.3  80.0 - 100.0 fL Final   MCH 07/06/2022 29.8  26.0 - 34.0 pg Final   MCHC 07/06/2022 34.1  30.0 - 36.0 g/dL Final   RDW 07/06/2022 13.2  11.5 - 15.5 % Final   Platelets 07/06/2022 273  150 - 400 K/uL Final   nRBC 07/06/2022 0.0  0.0 - 0.2 % Final   Neutrophils Relative %  07/06/2022 68  % Final   Neutro Abs 07/06/2022 6.9  1.7 - 7.7 K/uL Final   Lymphocytes Relative 07/06/2022 20  % Final   Lymphs Abs 07/06/2022 2.0  0.7 - 4.0 K/uL Final   Monocytes Relative 07/06/2022 10  % Final   Monocytes Absolute 07/06/2022 1.0  0.1 - 1.0 K/uL Final   Eosinophils Relative 07/06/2022 1  % Final   Eosinophils Absolute 07/06/2022 0.1  0.0 - 0.5 K/uL Final   Basophils Relative 07/06/2022 1  % Final   Basophils Absolute 07/06/2022 0.1  0.0 - 0.1 K/uL Final   Immature Granulocytes 07/06/2022 0  % Final   Abs Immature Granulocytes 07/06/2022 0.03  0.00 - 0.07 K/uL Final   Performed at [facility], [place] [address], [place], [place] [address]   Sodium 07/06/2022 137  135 - 145 mmol/L Final   Potassium 07/06/2022 4.0  3.5 - 5.1 mmol/L Final   Chloride 07/06/2022 102  98 - 111 mmol/L Final   CO2 07/06/2022 25  22 - 32 mmol/L Final   Glucose, Bld 07/06/2022 83  70 - 99 mg/dL Final   Glucose reference range applies only to samples taken after fasting for at least 8 hours.   BUN 07/06/2022 16  6 - 20 mg/dL Final   Creatinine, Ser 07/06/2022 1.18  0.61 - 1.24 mg/dL Final   Calcium 07/06/2022 9.6  8.9 - 10.3 mg/dL Final   Total Protein 07/06/2022 7.2  6.5 -  8.1 g/dL Final   Albumin 07/06/2022 3.9  3.5 - 5.0 g/dL Final   AST 07/06/2022 72 (H)  15 - 41 U/L Final   ALT 07/06/2022 60 (H)  0 - 44 U/L Final   Alkaline Phosphatase 07/06/2022 82  38 - 126 U/L Final   Total Bilirubin 07/06/2022 0.6  0.3 - 1.2 mg/dL Final   GFR, Estimated 07/06/2022 >60  >60 mL/min Final   Comment: (NOTE) Calculated using the CKD-EPI Creatinine Equation (2021)    Anion gap 07/06/2022 10  5 - 15 Final   Performed at [facility], [place] [address], [place], [place] [address]   Alcohol, Ethyl (B) 07/06/2022 <10  <10 mg/dL Final   Comment: (NOTE) Lowest detectable limit for serum alcohol is 10 mg/dL.  For medical purposes only. Performed at [facility], [place] [address] [place],  [place] [address]    POC Amphetamine UR 07/06/2022 Positive (PE)  NONE DETECTED (Cut Off Level 1000 ng/mL) Final   POC Secobarbital (BAR) 07/06/2022 None Detected  NONE DETECTED (Cut Off Level 300 ng/mL) Final   POC Buprenorphine (BUP) 07/06/2022 Positive (PE)  NONE DETECTED (Cut Off Level 10 ng/mL) Final   POC Oxazepam (BZO) 07/06/2022 None Detected  NONE DETECTED (Cut Off Level 300 ng/mL) Final   POC Cocaine UR 07/06/2022 None Detected  NONE DETECTED (Cut Off Level 300 ng/mL) Final   POC Methamphetamine UR 07/06/2022 Positive (PE)  NONE DETECTED (Cut Off Level 1000 ng/mL) Final   POC Morphine 07/06/2022 None Detected  NONE DETECTED (Cut Off Level 300 ng/mL) Final   POC Methadone UR 07/06/2022 None Detected  NONE DETECTED (Cut Off Level 300 ng/mL) Final   POC Oxycodone UR 07/06/2022 None Detected  NONE DETECTED (Cut Off Level 100 ng/mL) Final   POC Marijuana UR 07/06/2022 None Detected  NONE DETECTED (Cut Off Level 50 ng/mL) Final   SARSCOV2ONAVIRUS 2 AG 07/06/2022 NEGATIVE  NEGATIVE Final   Comment: (NOTE) SARS-CoV-2 antigen NOT DETECTED.   Negative results are presumptive.  Negative results do not preclude SARS-CoV-2 infection and should not be used as the sole basis for treatment or other patient management decisions, including infection  control decisions, particularly in the presence of clinical signs and  symptoms consistent with COVID-19, or in those who have been in contact with the virus.  Negative results must be combined with clinical observations, patient history, and epidemiological information. The expected result is Negative.  Fact Sheet for Patients: HandmadeRecipes.com.cy  Fact Sheet for Healthcare Providers: FuneralLife.at  This test is not yet approved or cleared by the [place] FDA and  has been authorized for detection and/or diagnosis of SARS-CoV-2 by FDA under an Emergency Use Authorization (EUA).  This EUA  will remain in effect (meaning this test can be used) for the duration of  the COV                          ID-19 declaration under Section 564(b)(1) of the Act, 21 U.S.C. section 360bbb-3(b)(1), unless the authorization is terminated or revoked sooner.    Admission on 07/03/2022, Discharged on 07/03/2022  Component Date Value Ref Range Status   SARS Coronavirus 2 by RT PCR 07/03/2022 NEGATIVE  NEGATIVE Final   Comment: (NOTE) SARS-CoV-2 target nucleic acids are NOT DETECTED.  The SARS-CoV-2 RNA is generally detectable in upper respiratory specimens during the acute phase of infection. The lowest concentration of SARS-CoV-2 viral copies this assay can detect is  138 copies/mL. A negative result does not preclude SARS-Cov-2 infection and should not be used as the sole basis for treatment or other patient management decisions. A negative result may occur with  improper specimen collection/handling, submission of specimen other than nasopharyngeal swab, presence of viral mutation(s) within the areas targeted by this assay, and inadequate number of viral copies(<138 copies/mL). A negative result must be combined with clinical observations, patient history, and epidemiological information. The expected result is Negative.  Fact Sheet for Patients:  EntrepreneurPulse.com.au  Fact Sheet for Healthcare Providers:  IncredibleEmployment.be  This test is no                          t yet approved or cleared by the [place] FDA and  has been authorized for detection and/or diagnosis of SARS-CoV-2 by FDA under an Emergency Use Authorization (EUA). This EUA will remain  in effect (meaning this test can be used) for the duration of the COVID-19 declaration under Section 564(b)(1) of the Act, 21 U.S.C.section 360bbb-3(b)(1), unless the authorization is terminated  or revoked sooner.       Influenza A by PCR 07/03/2022 NEGATIVE  NEGATIVE Final    Influenza B by PCR 07/03/2022 NEGATIVE  NEGATIVE Final   Comment: (NOTE) The Xpert Xpress SARS-CoV-2/FLU/RSV plus assay is intended as an aid in the diagnosis of influenza from Nasopharyngeal swab specimens and should not be used as a sole basis for treatment. Nasal washings and aspirates are unacceptable for Xpert Xpress SARS-CoV-2/FLU/RSV testing.  Fact Sheet for Patients: EntrepreneurPulse.com.au  Fact Sheet for Healthcare Providers: IncredibleEmployment.be  This test is not yet approved or cleared by the [place] FDA and has been authorized for detection and/or diagnosis of SARS-CoV-2 by FDA under an Emergency Use Authorization (EUA). This EUA will remain in effect (meaning this test can be used) for the duration of the COVID-19 declaration under Section 564(b)(1) of the Act, 21 U.S.C. section 360bbb-3(b)(1), unless the authorization is terminated or revoked.  Performed at [facility], [place] [address] [place], [place] [address]    Alcohol, Ethyl (B) 07/03/2022 <10  <10 mg/dL Final   Comment: (NOTE) Lowest detectable limit for serum alcohol is 10 mg/dL.  For medical purposes only. Performed at [place] [facility], [place] [address] [place], [place] [address]    Opiates 07/03/2022 NONE DETECTED  NONE DETECTED Final   Cocaine 07/03/2022 POSITIVE (A)  NONE DETECTED Final   Benzodiazepines 07/03/2022 NONE DETECTED  NONE DETECTED Final   Amphetamines 07/03/2022 POSITIVE (A)  NONE DETECTED Final   Tetrahydrocannabinol 07/03/2022 NONE DETECTED  NONE DETECTED Final   Barbiturates 07/03/2022 NONE DETECTED  NONE DETECTED Final   Comment: (NOTE) DRUG SCREEN FOR MEDICAL PURPOSES ONLY.  IF CONFIRMATION IS NEEDED FOR ANY PURPOSE, NOTIFY LAB WITHIN 5 DAYS.  LOWEST DETECTABLE LIMITS FOR URINE DRUG SCREEN Drug Class                     Cutoff (ng/mL) Amphetamine and metabolites    1000 Barbiturate and metabolites     200 Benzodiazepine                 353 Tricyclics and metabolites     300 Opiates and metabolites        300 Cocaine and metabolites        300 THC  50 Performed at [facility], [place] [address] [place], [place] [address]   Admission on 04/11/2022, Discharged on 04/14/2022  Component Date Value Ref Range Status   SARS Coronavirus 2 by RT PCR 04/11/2022 NEGATIVE  NEGATIVE Final   Comment: (NOTE) SARS-CoV-2 target nucleic acids are NOT DETECTED.  The SARS-CoV-2 RNA is generally detectable in upper respiratory specimens during the acute phase of infection. The lowest concentration of SARS-CoV-2 viral copies this assay can detect is 138 copies/mL. A negative result does not preclude SARS-Cov-2 infection and should not be used as the sole basis for treatment or other patient management decisions. A negative result may occur with  improper specimen collection/handling, submission of specimen other than nasopharyngeal swab, presence of viral mutation(s) within the areas targeted by this assay, and inadequate number of viral copies(<138 copies/mL). A negative result must be combined with clinical observations, patient history, and epidemiological information. The expected result is Negative.  Fact Sheet for Patients:  EntrepreneurPulse.com.au  Fact Sheet for Healthcare Providers:  IncredibleEmployment.be  This test is no                          t yet approved or cleared by the [place] FDA and  has been authorized for detection and/or diagnosis of SARS-CoV-2 by FDA under an Emergency Use Authorization (EUA). This EUA will remain  in effect (meaning this test can be used) for the duration of the COVID-19 declaration under Section 564(b)(1) of the Act, 21 U.S.C.section 360bbb-3(b)(1), unless the authorization is terminated  or revoked sooner.       Influenza A by PCR 04/11/2022 NEGATIVE   NEGATIVE Final   Influenza B by PCR 04/11/2022 NEGATIVE  NEGATIVE Final   Comment: (NOTE) The Xpert Xpress SARS-CoV-2/FLU/RSV plus assay is intended as an aid in the diagnosis of influenza from Nasopharyngeal swab specimens and should not be used as a sole basis for treatment. Nasal washings and aspirates are unacceptable for Xpert Xpress SARS-CoV-2/FLU/RSV testing.  Fact Sheet for Patients: EntrepreneurPulse.com.au  Fact Sheet for Healthcare Providers: IncredibleEmployment.be  This test is not yet approved or cleared by the [place] FDA and has been authorized for detection and/or diagnosis of SARS-CoV-2 by FDA under an Emergency Use Authorization (EUA). This EUA will remain in effect (meaning this test can be used) for the duration of the COVID-19 declaration under Section 564(b)(1) of the Act, 21 U.S.C. section 360bbb-3(b)(1), unless the authorization is terminated or revoked.  Performed at [facility], [place] [address] [place], [place] [address]    WBC 04/11/2022 13.9 (H)  4.0 - 10.5 K/uL Final   RBC 04/11/2022 4.37  4.22 - 5.81 MIL/uL Final   Hemoglobin 04/11/2022 13.0  13.0 - 17.0 g/dL Final   HCT 04/11/2022 40.0  39.0 - 52.0 % Final   MCV 04/11/2022 91.5  80.0 - 100.0 fL Final   MCH 04/11/2022 29.7  26.0 - 34.0 pg Final   MCHC 04/11/2022 32.5  30.0 - 36.0 g/dL Final   RDW 04/11/2022 15.1  11.5 - 15.5 % Final   Platelets 04/11/2022 322  150 - 400 K/uL Final   nRBC 04/11/2022 0.0  0.0 - 0.2 % Final   Neutrophils Relative % 04/11/2022 71  % Final   Neutro Abs 04/11/2022 9.9 (H)  1.7 - 7.7 K/uL Final   Lymphocytes Relative 04/11/2022 18  % Final   Lymphs Abs 04/11/2022 2.5  0.7 - 4.0 K/uL Final  Monocytes Relative 04/11/2022 8  % Final   Monocytes Absolute 04/11/2022 1.1 (H)  0.1 - 1.0 K/uL Final   Eosinophils Relative 04/11/2022 2  % Final   Eosinophils Absolute 04/11/2022 0.3  0.0 - 0.5 K/uL Final   Basophils Relative  04/11/2022 0  % Final   Basophils Absolute 04/11/2022 0.0  0.0 - 0.1 K/uL Final   Immature Granulocytes 04/11/2022 1  % Final   Abs Immature Granulocytes 04/11/2022 0.07  0.00 - 0.07 K/uL Final   Performed at [facility], [place] [address] [place], [place] [address]   Sodium 04/11/2022 138  135 - 145 mmol/L Final   Potassium 04/11/2022 4.3  3.5 - 5.1 mmol/L Final   Chloride 04/11/2022 104  98 - 111 mmol/L Final   CO2 04/11/2022 24  22 - 32 mmol/L Final   Glucose, Bld 04/11/2022 80  70 - 99 mg/dL Final   Glucose reference range applies only to samples taken after fasting for at least 8 hours.   BUN 04/11/2022 12  6 - 20 mg/dL Final   Creatinine, Ser 04/11/2022 1.17  0.61 - 1.24 mg/dL Final   Calcium 04/11/2022 9.2  8.9 - 10.3 mg/dL Final   Total Protein 04/11/2022 6.9  6.5 - 8.1 g/dL Final   Albumin 04/11/2022 3.9  3.5 - 5.0 g/dL Final   AST 04/11/2022 33  15 - 41 U/L Final   ALT 04/11/2022 47 (H)  0 - 44 U/L Final   Alkaline Phosphatase 04/11/2022 69  38 - 126 U/L Final   Total Bilirubin 04/11/2022 0.5  0.3 - 1.2 mg/dL Final   GFR, Estimated 04/11/2022 >60  >60 mL/min Final   Comment: (NOTE) Calculated using the CKD-EPI Creatinine Equation (2021)    Anion gap 04/11/2022 10  5 - 15 Final   Performed at [facility] [address] [place], [place] [address]   POC Amphetamine UR 04/11/2022 None Detected  NONE DETECTED (Cut Off Level 1000 ng/mL) Final   POC Secobarbital (BAR) 04/11/2022 None Detected  NONE DETECTED (Cut Off Level 300 ng/mL) Final   POC Buprenorphine (BUP) 04/11/2022 None Detected  NONE DETECTED (Cut Off Level 10 ng/mL) Final   POC Oxazepam (BZO) 04/11/2022 None Detected  NONE DETECTED (Cut Off Level 300 ng/mL) Final   POC Cocaine UR 04/11/2022 None Detected  NONE DETECTED (Cut Off Level 300 ng/mL) Final   POC Methamphetamine UR 04/11/2022 None Detected  NONE DETECTED (Cut Off Level 1000 ng/mL) Final   POC Morphine 04/11/2022 None Detected  NONE DETECTED (Cut  Off Level 300 ng/mL) Final   POC Methadone UR 04/11/2022 None Detected  NONE DETECTED (Cut Off Level 300 ng/mL) Final   POC Oxycodone UR 04/11/2022 None Detected  NONE DETECTED (Cut Off Level 100 ng/mL) Final   POC Marijuana UR 04/11/2022 None Detected  NONE DETECTED (Cut Off Level 50 ng/mL) Final  Admission on 03/30/2022, Discharged on 04/01/2022  Component Date Value Ref Range Status   SARS Coronavirus 2 by RT PCR 03/30/2022 NEGATIVE  NEGATIVE Final   Comment: (NOTE) SARS-CoV-2 target nucleic acids are NOT DETECTED.  The SARS-CoV-2 RNA is generally detectable in upper respiratory specimens during the acute phase of infection. The lowest concentration of SARS-CoV-2 viral copies this assay can detect is 138 copies/mL. A negative result does not preclude SARS-Cov-2 infection and should not be used as the sole basis for treatment or other patient management decisions. A negative result may occur with  improper specimen collection/handling, submission of  specimen other than nasopharyngeal swab, presence of viral mutation(s) within the areas targeted by this assay, and inadequate number of viral copies(<138 copies/mL). A negative result must be combined with clinical observations, patient history, and epidemiological information. The expected result is Negative.  Fact Sheet for Patients:  EntrepreneurPulse.com.au  Fact Sheet for Healthcare Providers:  IncredibleEmployment.be  This test is no                          t yet approved or cleared by the [place] FDA and  has been authorized for detection and/or diagnosis of SARS-CoV-2 by FDA under an Emergency Use Authorization (EUA). This EUA will remain  in effect (meaning this test can be used) for the duration of the COVID-19 declaration under Section 564(b)(1) of the Act, 21 U.S.C.section 360bbb-3(b)(1), unless the authorization is terminated  or revoked sooner.       Influenza A by PCR  03/30/2022 NEGATIVE  NEGATIVE Final   Influenza B by PCR 03/30/2022 NEGATIVE  NEGATIVE Final   Comment: (NOTE) The Xpert Xpress SARS-CoV-2/FLU/RSV plus assay is intended as an aid in the diagnosis of influenza from Nasopharyngeal swab specimens and should not be used as a sole basis for treatment. Nasal washings and aspirates are unacceptable for Xpert Xpress SARS-CoV-2/FLU/RSV testing.  Fact Sheet for Patients: EntrepreneurPulse.com.au  Fact Sheet for Healthcare Providers: IncredibleEmployment.be  This test is not yet approved or cleared by the [place] FDA and has been authorized for detection and/or diagnosis of SARS-CoV-2 by FDA under an Emergency Use Authorization (EUA). This EUA will remain in effect (meaning this test can be used) for the duration of the COVID-19 declaration under Section 564(b)(1) of the Act, 21 U.S.C. section 360bbb-3(b)(1), unless the authorization is terminated or revoked.  Performed at [facility], [place] [address] [place], [place] [address]    POC Amphetamine UR 03/30/2022 Positive (A)  NONE DETECTED (Cut Off Level 1000 ng/mL) Final   POC Secobarbital (BAR) 03/30/2022 None Detected  NONE DETECTED (Cut Off Level 300 ng/mL) Final   POC Buprenorphine (BUP) 03/30/2022 None Detected  NONE DETECTED (Cut Off Level 10 ng/mL) Final   POC Oxazepam (BZO) 03/30/2022 None Detected  NONE DETECTED (Cut Off Level 300 ng/mL) Final   POC Cocaine UR 03/30/2022 None Detected  NONE DETECTED (Cut Off Level 300 ng/mL) Final   POC Methamphetamine UR 03/30/2022 Positive (A)  NONE DETECTED (Cut Off Level 1000 ng/mL) Final   POC Morphine 03/30/2022 None Detected  NONE DETECTED (Cut Off Level 300 ng/mL) Final   POC Methadone UR 03/30/2022 None Detected  NONE DETECTED (Cut Off Level 300 ng/mL) Final   POC Oxycodone UR 03/30/2022 None Detected  NONE DETECTED (Cut Off Level 100 ng/mL) Final   POC Marijuana UR 03/30/2022 Positive (A)   NONE DETECTED (Cut Off Level 50 ng/mL) Final   SARSCOV2ONAVIRUS 2 AG 03/30/2022 NEGATIVE  NEGATIVE Final   Comment: (NOTE) SARS-CoV-2 antigen NOT DETECTED.   Negative results are presumptive.  Negative results do not preclude SARS-CoV-2 infection and should not be used as the sole basis for treatment or other patient management decisions, including infection  control decisions, particularly in the presence of clinical signs and  symptoms consistent with COVID-19, or in those who have been in contact with the virus.  Negative results must be combined with clinical observations, patient history, and epidemiological information. The expected result is Negative.  Fact Sheet for Patients: HandmadeRecipes.com.cy  Fact Sheet for Healthcare  Providers: FuneralLife.at  This test is not yet approved or cleared by the [place] and  has been authorized for detection and/or diagnosis of SARS-CoV-2 by FDA under an Emergency Use Authorization (EUA).  This EUA will remain in effect (meaning this test can be used) for the duration of  the COV                          ID-19 declaration under Section 564(b)(1) of the Act, 21 U.S.C. section 360bbb-3(b)(1), unless the authorization is terminated or revoked sooner.    Admission on 03/29/2022, Discharged on 03/30/2022  Component Date Value Ref Range Status   Sodium 03/29/2022 142  135 - 145 mmol/L Final   Potassium 03/29/2022 3.5  3.5 - 5.1 mmol/L Final   Chloride 03/29/2022 110  98 - 111 mmol/L Final   CO2 03/29/2022 24  22 - 32 mmol/L Final   Glucose, Bld 03/29/2022 108 (H)  70 - 99 mg/dL Final   Glucose reference range applies only to samples taken after fasting for at least 8 hours.   BUN 03/29/2022 13  6 - 20 mg/dL Final   Creatinine, Ser 03/29/2022 1.19  0.61 - 1.24 mg/dL Final   Calcium 03/29/2022 9.2  8.9 - 10.3 mg/dL Final   Total Protein 03/29/2022 7.7  6.5 - 8.1 g/dL Final   Albumin  03/29/2022 4.2  3.5 - 5.0 g/dL Final   AST 03/29/2022 61 (H)  15 - 41 U/L Final   ALT 03/29/2022 48 (H)  0 - 44 U/L Final   Alkaline Phosphatase 03/29/2022 64  38 - 126 U/L Final   Total Bilirubin 03/29/2022 0.9  0.3 - 1.2 mg/dL Final   GFR, Estimated 03/29/2022 >60  >60 mL/min Final   Comment: (NOTE) Calculated using the CKD-EPI Creatinine Equation (2021)    Anion gap 03/29/2022 8  5 - 15 Final   Performed at [facility], [place] [address] [place], [place] [address]   Alcohol, Ethyl (B) 03/29/2022 <10  <10 mg/dL Final   Comment: (NOTE) Lowest detectable limit for serum alcohol is 10 mg/dL.  For medical purposes only. Performed at [facility], [place] [address], [place], [place] [address]    Salicylate Lvl 47/82/9562 <7.0 (L)  7.0 - 30.0 mg/dL Final   Performed at [facility] [address] [place], [place] [address]   Acetaminophen (Tylenol), Serum 03/29/2022 <10 (L)  10 - 30 ug/mL Final   Comment: (NOTE) Therapeutic concentrations vary significantly. A range of 10-30 ug/mL  may be an effective concentration for many patients. However, some  are best treated at concentrations outside of this range. Acetaminophen concentrations >150 ug/mL at 4 hours after ingestion  and >50 ug/mL at 12 hours after ingestion are often associated with  toxic reactions.  Performed at [facility], [place] [address], [place], [place] [address]    WBC 03/29/2022 7.5  4.0 - 10.5 K/uL Final   RBC 03/29/2022 4.21 (L)  4.22 - 5.81 MIL/uL Final   Hemoglobin 03/29/2022 12.2 (L)  13.0 - 17.0 g/dL Final   HCT 03/29/2022 38.1 (L)  39.0 - 52.0 % Final   MCV 03/29/2022 90.5  80.0 - 100.0 fL Final   MCH 03/29/2022 29.0  26.0 - 34.0 pg Final   MCHC 03/29/2022 32.0  30.0 - 36.0 g/dL Final   RDW 03/29/2022 15.3  11.5 - 15.5 % Final   Platelets 03/29/2022 228  150 - 400 K/uL Final   nRBC 03/29/2022 0.0  0.0 - 0.2 % Final   Performed at  [facility], [place] [address] [place], [place] [address]   Opiates 03/29/2022 NONE DETECTED  NONE DETECTED Final   Cocaine 03/29/2022 NONE DETECTED  NONE DETECTED Final   Benzodiazepines 03/29/2022 NONE DETECTED  NONE DETECTED Final   Amphetamines 03/29/2022 POSITIVE (A)  NONE DETECTED Final   Tetrahydrocannabinol 03/29/2022 POSITIVE (A)  NONE DETECTED Final   Barbiturates 03/29/2022 NONE DETECTED  NONE DETECTED Final   Comment: (NOTE) DRUG SCREEN FOR MEDICAL PURPOSES ONLY.  IF CONFIRMATION IS NEEDED FOR ANY PURPOSE, NOTIFY LAB WITHIN 5 DAYS.  LOWEST DETECTABLE LIMITS FOR URINE DRUG SCREEN Drug Class                     Cutoff (ng/mL) Amphetamine and metabolites    1000 Barbiturate and metabolites    200 Benzodiazepine                 623 Tricyclics and metabolites     300 Opiates and metabolites        300 Cocaine and metabolites        300 THC                            50 Performed at [facility], [place] [address], [place], [place] [address]    Troponin I (High Sensitivity) 03/29/2022 3  <18 ng/L Final   Comment: (NOTE) Elevated high sensitivity troponin I (hsTnI) values and significant  changes across serial measurements may suggest ACS but many other  chronic and acute conditions are known to elevate hsTnI results.  Refer to the "Links" section for chest pain algorithms and additional  guidance. Performed at [facility], [place] [address] [place], [place] [address]    SARS Coronavirus 2 by RT PCR 03/29/2022 NEGATIVE  NEGATIVE Final   Comment: (NOTE) SARS-CoV-2 target nucleic acids are NOT DETECTED.  The SARS-CoV-2 RNA is generally detectable in upper and lower respiratory specimens during the acute phase of infection. The lowest concentration of SARS-CoV-2 viral copies this assay can detect is 250 copies / mL. A negative result does not preclude SARS-CoV-2 infection and should not be used as the sole basis for  treatment or other patient management decisions.  A negative result may occur with improper specimen collection / handling, submission of specimen other than nasopharyngeal swab, presence of viral mutation(s) within the areas targeted by this assay, and inadequate number of viral copies (<250 copies / mL). A negative result must be combined with clinical observations, patient history, and epidemiological information.  Fact Sheet for Patients:   https://www.patel.info/  Fact Sheet for Healthcare Providers: https://hall.com/  This test is not yet approved or                           cleared by the [place] FDA and has been authorized for detection and/or diagnosis of SARS-CoV-2 by FDA under an Emergency Use Authorization (EUA).  This EUA will remain in effect (meaning this test can be used) for the duration of the COVID-19 declaration under Section 564(b)(1) of the Act, 21 U.S.C. section 360bbb-3(b)(1), unless the authorization is terminated or revoked sooner.  Performed at [facility], [place] [address], [place], [place] [address]    SARS Coronavirus 2 by RT PCR 03/30/2022  NEGATIVE  NEGATIVE Final   Comment: (NOTE) SARS-CoV-2 target nucleic acids are NOT DETECTED.  The SARS-CoV-2 RNA is generally detectable in upper respiratory specimens during the acute phase of infection. The lowest concentration of SARS-CoV-2 viral copies this assay can detect is 138 copies/mL. A negative result does not preclude SARS-Cov-2 infection and should not be used as the sole basis for treatment or other patient management decisions. A negative result may occur with  improper specimen collection/handling, submission of specimen other than nasopharyngeal swab, presence of viral mutation(s) within the areas targeted by this assay, and inadequate number of viral copies(<138 copies/mL). A negative result must be combined with clinical  observations, patient history, and epidemiological information. The expected result is Negative.  Fact Sheet for Patients:  EntrepreneurPulse.com.au  Fact Sheet for Healthcare Providers:  IncredibleEmployment.be  This test is no                          t yet approved or cleared by the [place] FDA and  has been authorized for detection and/or diagnosis of SARS-CoV-2 by FDA under an Emergency Use Authorization (EUA). This EUA will remain  in effect (meaning this test can be used) for the duration of the COVID-19 declaration under Section 564(b)(1) of the Act, 21 U.S.C.section 360bbb-3(b)(1), unless the authorization is terminated  or revoked sooner.       Influenza A by PCR 03/30/2022 NEGATIVE  NEGATIVE Final   Influenza B by PCR 03/30/2022 NEGATIVE  NEGATIVE Final   Comment: (NOTE) The Xpert Xpress SARS-CoV-2/FLU/RSV plus assay is intended as an aid in the diagnosis of influenza from Nasopharyngeal swab specimens and should not be used as a sole basis for treatment. Nasal washings and aspirates are unacceptable for Xpert Xpress SARS-CoV-2/FLU/RSV testing.  Fact Sheet for Patients: EntrepreneurPulse.com.au  Fact Sheet for Healthcare Providers: IncredibleEmployment.be  This test is not yet approved or cleared by the [place] FDA and has been authorized for detection and/or diagnosis of SARS-CoV-2 by FDA under an Emergency Use Authorization (EUA). This EUA will remain in effect (meaning this test can be used) for the duration of the COVID-19 declaration under Section 564(b)(1) of the Act, 21 U.S.C. section 360bbb-3(b)(1), unless the authorization is terminated or revoked.  Performed at [facility], [place] [address] [place], [place] [address]     Blood Alcohol level:  Lab Results  Component Value Date   ETH <10 07/06/2022   ETH <10 93/79/0240    Metabolic Disorder  Labs: Lab Results  Component Value Date   HGBA1C 5.8 (H) 01/27/2021   MPG 119.76 01/27/2021   MPG 122.63 12/21/2020   No results found for: "PROLACTIN" Lab Results  Component Value Date   CHOL 122 01/27/2021   TRIG 75 01/27/2021   HDL 40 (L) 01/27/2021   CHOLHDL 3.1 01/27/2021   VLDL 15 01/27/2021   LDLCALC 67 01/27/2021   LDLCALC 72 12/21/2020    Therapeutic Lab Levels: No results found for: "LITHIUM" No results found for: "VALPROATE" No results found for: "CBMZ"  Physical Findings   AIMS    Flowsheet Row Admission (Discharged) from 12/21/2020 in [facility] Admission (Discharged) from 03/25/2018 in [facility]  AIMS Total Score 0 0      AUDIT    Flowsheet Row Admission (Discharged) from 12/21/2020 in [facility] ED from 05/02/2020 in [facility] Admission (Discharged) from  03/25/2018 in [facility]  Alcohol Use Disorder Identification Test Final Score (AUDIT) 0 4 0      PHQ2-9    Flowsheet Row ED from 07/06/2022 in [facility] ED from 04/11/2022 in [facility] ED from 03/30/2022 in [facility] ED from 05/02/2020 in [facility] DEPT  PHQ-2 Total Score _0 PHQ-9 Total Score _1 Flowsheet Row ED from 07/06/2022 in [facility] ED from 07/04/2022 in [facility] DEPT ED from 07/03/2022 in [facility] DEPT  C-SSRS RISK CATEGORY No Risk No Risk No Risk        Musculoskeletal  Strength & Muscle Tone: within normal limits Gait & Station: normal Patient leans: N/A  Psychiatric Specialty Exam  Presentation  General Appearance: Disheveled  Eye Contact:Minimal  Speech:Pressured  Speech  Volume:Increased  Handedness:Right   Mood and Affect  Mood:Anxious  Affect:Congruent   Thought Process  Thought Processes:Coherent  Descriptions of Associations:Intact  Orientation:Full (Time, Place and Person)  Thought Content:Logical  Diagnosis of Schizophrenia or Schizoaffective disorder in past: No    Hallucinations:Hallucinations: None  Ideas of Reference:None  Suicidal Thoughts:Suicidal Thoughts: No SI Passive Intent and/or Plan: Without Intent; Without Plan  Homicidal Thoughts:Homicidal Thoughts: No   Sensorium  Memory:Immediate Fair; Recent Fair; Remote Fair  Judgment:Intact  Insight:None   Executive Functions  Concentration:Poor  Attention Span:Poor  [place]   Psychomotor Activity  Psychomotor Activity:Psychomotor Activity: Restlessness   Assets  Assets:Communication Skills; Desire for Improvement; Leisure Time; Physical Health   Sleep  Sleep:Sleep: Poor Number of Hours of Sleep: 1   Nutritional Assessment (For OBS and FBC admissions only) Has the patient had a weight loss or gain of 10 pounds or more in the last 3 months?: No Has the patient had a decrease in food intake/or appetite?: No Does the patient have dental problems?: No Does the patient have eating habits or behaviors that may be indicators of an eating disorder including binging or inducing vomiting?: No Has the patient recently lost weight without trying?: 0    Physical Exam  Physical Exam HENT:     Head: Normocephalic.     Nose: Nose normal.  Eyes:     Conjunctiva/sclera: Conjunctivae normal.  Cardiovascular:     Rate and Rhythm: Normal rate.  Pulmonary:     Effort: Pulmonary effort is normal.  Musculoskeletal:        General: Normal range of motion.     Cervical back: Normal range of motion.  Neurological:     Mental Status: He is alert and oriented to person, place, and time.    Review of Systems   Constitutional: Negative.   HENT: Negative.    Eyes: Negative.   Respiratory: Negative.    Cardiovascular: Negative.   Gastrointestinal: Negative.   Genitourinary: Negative.   Musculoskeletal: Negative.   Neurological: Negative.   Endo/Heme/Allergies: Negative.    Blood pressure 101/84, pulse (!) 53, temperature 97.8 F (36.6 C), temperature source Oral, resp. rate 20, SpO2 100 %. There is no height or weight on file to calculate BMI.  Treatment Plan Summary: Patient is voluntary. Patient admitted to the [facility] behavioral health continuous assessment unit while he awaits bed availability for the [facility] for substance abuse and mood stabilization.  Medications Continue clonidine 0.1 taper and [place] for  opiate use disorder   Restart home medication gabapentin at 200 mg 3 times daily (consider increasing to 300 3 times daily as previously prescribed)   Restart home medication Seroquel at 100 mg p.o. nightly for mood stabilization (consider increasing as previously prescribed)   Add Ativan 1 mg p.o. every 4 hours as needed for agitation  Add Zyprexa 5 mg p.o. every 8 hours as needed for agitation   Darrol Angel L, NP 07/06/2022 10:39 AM

## 2022-07-07 ENCOUNTER — Encounter (HOSPITAL_COMMUNITY): Payer: Self-pay

## 2022-07-07 LAB — LIPID PANEL
Cholesterol: 137 mg/dL (ref 0–200)
HDL: 45 mg/dL (ref >40)
LDL Cholesterol: 81 mg/dL (ref 0–99)
Total CHOL/HDL Ratio: 3 RATIO
Triglycerides: 56 mg/dL (ref <150)
VLDL: 11 mg/dL (ref 0–40)

## 2022-07-07 LAB — TSH: TSH: 1.168 u[IU]/mL (ref 0.350–4.500)

## 2022-07-07 MED ORDER — VENLAFAXINE HCL ER 37.5 MG PO CP24: 37.5000 mg | ORAL_CAPSULE | Freq: Every day | ORAL | Status: DC

## 2022-07-07 MED ORDER — GABAPENTIN 100 MG PO CAPS: 200.0000 mg | ORAL_CAPSULE | Freq: Three times a day (TID) | ORAL | 0 refills | Status: DC

## 2022-07-07 MED ORDER — QUETIAPINE FUMARATE 100 MG PO TABS: 100.0000 mg | ORAL_TABLET | Freq: Every day | ORAL | 0 refills | Status: DC

## 2022-07-07 NOTE — ED Notes (Signed)
Pt reports he does not want residential care after [place].  He states "all I want is detox.  I have a home.  I do not need residential. "  Pt refused clonidine.  Has been speaking in elevated tones with staff, fidgety movement when ambulating.

## 2022-07-07 NOTE — ED Notes (Signed)
Pt woke up late for group but attended the last few minutes.

## 2022-07-07 NOTE — ED Provider Notes (Signed)
FBC/OBS ASAP Discharge Summary  Date and Time: 07/07/2022 6:24 PM  Name: Brian [name]  MRN:  [id number]   Discharge Diagnoses:  Final diagnoses:  Suicidal ideation  Substance induced mood disorder ([place])  Homeless single person  Opioid use disorder, severe, dependence ([place])  Amphetamine use disorder, severe, dependence ([place])   HPI: Brian [name] is a 36 y/o single male with a history of bipolar disorder, suicidal ideation, polysubstance abuse, heroin, methamphetamine and cannabis use presenting voluntarily to [facility] to detox from methamphetamine use. Patient reports that he started using methamphetamine on Monday and last weekend he started snorting Wellbutrin. UDS is positive for amphetamines, opiates, cocaine and methamphetamines.  Per H&P: " Patient states that he was at an [employer] and he saw his girlfriend there with her boss and that upset him because he believes that there is something going on between them.. Patient was most recently in a sober living home [facility], but was discharged from the program due to his substance use.  Patient states he can return to [facility] after he has received substance abuse treatment for two weeks. Patient states that he has been using suboxone that he was prescribed when he was in treatment at [facility].    Patients stated that he feels that he has not properly grieved for his grandmother that passed away last year. Patient endorses having some suicidal ideations but no plan or intent to harm himself.  Patient wants to go to [facility] for residential treatment for substance abuse treatment and is interested in having therapy. Patient is alert oriented x4, anxious but cooperative, mood is depressed with congruent affect does not appear he is responding to internal or external stimuli, or having delusions or paranoia at this time. Patient meets criteria for inpatient treatment and will be admitted to [facility] continuous assessment  for crisis management, safety and stabilization.  "  Subjective:  Patient stated that he is here seeking treatment for his mental health.  Stated that his grandmother died some time ago, he was never able to grieve properly.  Which prompted his substance use.  Patient stated that would lead him to come during this time, was that he was just "tired of being high".  Patient stated that current medications of clonidine taper are not helpful to him, which is why he has been refusing it.  Stated that he has had considerable withdrawal symptoms and cravings, per below.  Patient denied SI/HI/AVH.discussed 988 and 911 and [facility]. Patient declined to answer further questions at this time.   ROS: SI: No HI:No PXT:GGYI   Mood: Anxious; Dysphoric Sleep:Fair Appetite: Fair Review of Systems  Respiratory:  Negative for cough and shortness of breath.   Cardiovascular:  Negative for chest pain and palpitations.  Gastrointestinal:  Positive for abdominal pain and diarrhea. Negative for nausea and vomiting.  Musculoskeletal:  Positive for myalgias.  Neurological:  Positive for headaches. Negative for dizziness and tremors.    Today, patient denied SI/HI/AVH, delusions, paranoia, first rank symptoms.   Stay Summary:  Behavior: Declined schedule medications, did not attend groups, demanding of certain medications  Brian [name] is a 36 y/o single male with a history of bipolar disorder, suicidal ideation, polysubstance abuse, heroin, methamphetamine and cannabis use presenting voluntarily to [facility] to detox from methamphetamine use. Patient reports that he started using methamphetamine on Monday and last weekend he started snorting Wellbutrin. UDS is positive for amphetamines, opiates, cocaine and methamphetamines. Patient is wellknown to [facility]/FBC.  Since admission, patient has declined scheduled clonidine and requested uptitration of seroquel and gabapentin. Concerning for secondary gain, as these Rx  does have street value. Patient requested to be d/c'd after 24 hrs of admission to [facility].   Methamphetamine and heroin detox Continued clonidine taper with COWS per protocol Continued comfort PRNs   SIMD Continued seroquel 100 mg qHS Continued gabapentin 200 mg TID Home wellbutrin was d/c for medication misuse and ineffectiveness  Plan to start effexor 37.5 mg on 07/08/2022, however patient requested to be d/c'd   Total Time spent with patient: 30 minutes  Past Psychiatric History: Per H&P Past Medical History:  Past Medical History:  Diagnosis Date   Alcohol abuse    Anxiety    Bipolar 2 disorder ([place])    Cocaine abuse ([place])    Depression    Kidney stones    Methamphetamine abuse, episodic ([place])    Myocardial infarction ([place])    Narcotic abuse ([place])    Peptic ulcer    Polysubstance abuse ([place])    Renal disorder    kidney stones    Past Surgical History:  Procedure Laterality Date   ESOPHAGOGASTRODUODENOSCOPY N/A 05/29/2014   Procedure: ESOPHAGOGASTRODUODENOSCOPY (EGD) ;  Surgeon: Beryle Beams, MD;  Location: [facility] ENDOSCOPY;  Service: Endoscopy;  Laterality: N/A;  check with Dr. Collene Mares about sedation type/timinng - I recommend MAC   HAND SURGERY  2006   LEFT HEART CATH AND CORONARY ANGIOGRAPHY N/A 06/21/2020   Procedure: LEFT HEART CATH AND CORONARY ANGIOGRAPHY;  Surgeon: Nelva Bush, MD;  Location: [place] CV LAB;  Service: Cardiovascular;  Laterality: N/A;   Family History:  Family History  Problem Relation Age of Onset   Heart disease Father    Family Psychiatric History: Per H&P Social History:  Social History   Substance and Sexual Activity  Alcohol Use Not Currently     Social History   Substance and Sexual Activity  Drug Use Yes   Types: IV, Methamphetamines    Social History   Socioeconomic History   Marital status: Single    Spouse name: Not on file   Number of children: Not on file   Years of education: Not on file   Highest education  level: Not on file  Occupational History   Not on file  Tobacco Use   Smoking status: Every Day    Packs/day: 0.50    Types: Cigarettes   Smokeless tobacco: Never  Vaping Use   Vaping Use: Never used  Substance and Sexual Activity   Alcohol use: Not Currently   Drug use: Yes    Types: IV, Methamphetamines   Sexual activity: Not Currently  Other Topics Concern   Not on file  Social History Narrative   ** Merged History Encounter **       Lives with a friend   Lost house and custody of son 9broke up with fiancee)   Nature conservation officer   Social Determinants of Health   Financial Resource Strain: Not on file  Food Insecurity: Not on file  Transportation Needs: Not on file  Physical Activity: Not on file  Stress: Not on file  Social Connections: Not on file   SDOH:  [place]: High Risk (08/09/2020)  Alcohol Screen: Low Risk  (12/21/2020)  Depression (PHQ2-9): Medium Risk (07/06/2022)  Tobacco Use: High Risk (07/04/2022)   Tobacco Cessation:  A prescription for an FDA-approved tobacco cessation medication was offered at discharge and the patient refused  Current Medications:  Current Facility-Administered Medications  Medication Dose Route Frequency Provider Last Rate Last Admin   acetaminophen (TYLENOL) tablet 650 mg  650 mg Oral Q6H PRN Bobbitt, Shalon E, NP   650 mg at 07/06/22 0916   alum & mag hydroxide-simeth (MAALOX/MYLANTA) 200-200-20 MG/5ML suspension 30 mL  30 mL Oral Q4H PRN Bobbitt, Shalon E, NP       cloNIDine (CATAPRES) tablet 0.1 mg  0.1 mg Oral QID Bobbitt, Shalon E, NP   0.1 mg at 07/06/22 2113   Followed by   Derrill Memo ON 07/08/2022] cloNIDine (CATAPRES) tablet 0.1 mg  0.1 mg Oral BH-qamhs Bobbitt, Shalon E, NP       Followed by   Derrill Memo ON 07/11/2022] cloNIDine (CATAPRES) tablet 0.1 mg  0.1 mg Oral QAC breakfast Bobbitt, Shalon E, NP       dicyclomine (BENTYL) tablet 20 mg  20 mg Oral Q6H PRN Bobbitt, Shalon E, NP   20 mg at 07/06/22 0915    gabapentin (NEURONTIN) capsule 200 mg  200 mg Oral TID White, Patrice L, NP   200 mg at 07/07/22 1602   hydrocortisone cream 0.5 %   Topical TID PRN [place], Ene A, NP   1 Application at 85/27/78 1358   hydrOXYzine (ATARAX) tablet 25 mg  25 mg Oral Q6H PRN Bobbitt, Shalon E, NP   25 mg at 07/06/22 1948   loperamide (IMODIUM) capsule 2-4 mg  2-4 mg Oral PRN Bobbitt, Shalon E, NP       LORazepam (ATIVAN) tablet 1 mg  1 mg Oral Q4H PRN White, Patrice L, NP   1 mg at 07/06/22 1947   magnesium hydroxide (MILK OF MAGNESIA) suspension 30 mL  30 mL Oral Daily PRN Bobbitt, Shalon E, NP       methocarbamol (ROBAXIN) tablet 500 mg  500 mg Oral Q8H PRN Bobbitt, Shalon E, NP   500 mg at 07/06/22 0413   naproxen (NAPROSYN) tablet 500 mg  500 mg Oral BID PRN Bobbitt, Shalon E, NP   500 mg at 07/06/22 0413   neomycin-bacitracin-polymyxin [id number] OINT 1 Application  1 Application Topical TID PRN Bobbitt, Shalon E, NP   1 Application at 43/15/40 1952   OLANZapine zydis (ZYPREXA) disintegrating tablet 5 mg  5 mg Oral Q8H PRN White, Patrice L, NP       ondansetron (ZOFRAN-ODT) disintegrating tablet 4 mg  4 mg Oral Q6H PRN Bobbitt, Shalon E, NP       QUEtiapine (SEROQUEL) tablet 100 mg  100 mg Oral QHS White, Patrice L, NP   100 mg at 07/06/22 2114   traZODone (DESYREL) tablet 50 mg  50 mg Oral QHS PRN Bobbitt, Shalon E, NP       [START ON 07/08/2022] venlafaxine XR (EFFEXOR-XR) 24 hr capsule 37.5 mg  37.5 mg Oral Q breakfast Merrily Brittle, DO       Current Outpatient Medications  Medication Sig Dispense Refill   hydrOXYzine (ATARAX) 25 MG tablet Take 1 tablet (25 mg total) by mouth 3 (three) times daily as needed for anxiety. 90 tablet 1   isosorbide mononitrate (IMDUR) 30 MG 24 hr tablet Take 0.5 tablets (15 mg total) by mouth daily. 15 tablet 1   LIPITOR 80 MG tablet Take 1 tablet (80 mg total) by mouth at bedtime. 30 tablet 1   gabapentin (NEURONTIN) 100 MG capsule Take 2 capsules (200 mg total) by mouth 3  (three) times daily. 180 capsule 0   QUEtiapine (SEROQUEL) 100 MG tablet Take 1 tablet (  100 mg total) by mouth at bedtime. 30 tablet 0    PTA Medications: (Not in a hospital admission)     07/06/2022    2:35 PM 07/06/2022    2:45 AM 04/12/2022   11:15 AM  Depression screen PHQ 2/9  Decreased Interest 1 1 0  Down, Depressed, Hopeless 0 1 1  PHQ - 2 Score _0 Altered sleeping 0 1 0  Tired, decreased energy 0  1  Change in appetite 2 2   Feeling bad or failure about yourself  _1 Trouble concentrating 1 1 0  Moving slowly or fidgety/restless 3 1 0  Suicidal thoughts 0 1 0  PHQ-9 Score _2 Difficult doing work/chores Very difficult Somewhat difficult Somewhat difficult    Flowsheet Row ED from 07/06/2022 in [facility] ED from 07/04/2022 in [place] DEPT ED from 07/03/2022 in [place] DEPT  C-SSRS RISK CATEGORY No Risk No Risk No Risk       Musculoskeletal  Strength & Muscle Tone: within normal limits Gait & Station: normal Patient leans: N/A   Psychiatric Specialty Exam   General Appearance: Disheveled   Eye Contact: Minimal   Speech: Clear and Coherent; Normal Rate   Volume: Normal    Mood: Anxious; Dysphoric  Affect: Congruent; Appropriate; Restricted    Thought Process: Goal Directed; Coherent  Descriptions of Associations: Circumstantial   Orientation: Full (Time, Place and Person)   Thought Content: Rumination; Scattered (Avoidant and circumstantial of questions, did not give concrete answers to goals for treatment and stay)  Hallucinations: None   Ideas of Reference: None   Suicidal Thoughts: No  Homicidal Thoughts: No   Memory: Immediate Good    Judgement: Fair  Insight: Shallow    Psychomotor Activity: Restlessness    Concentration: Fair  Attention Span: Fair  Recall: [place] of Knowledge: Good     Language: Good    Handed: Right    Assets: Communication Skills; Desire for Improvement    Sleep: Fair       AIMS:    ,   ,   ,   ,      CIWA:  COWS:COWS Total Score: 3     Physical Exam  Physical Exam Vitals and nursing note reviewed.  Constitutional:      General: He is not in acute distress.    Appearance: He is not ill-appearing, toxic-appearing or diaphoretic.  HENT:     Head: Normocephalic.  Pulmonary:     Effort: Pulmonary effort is normal. No respiratory distress.  Neurological:     Mental Status: He is alert.  Psychiatric:        Behavior: Behavior is cooperative.    BP 124/89   Pulse 73   Temp 98.7 F (37.1 C)   Resp 18   SpO2 98%    Demographic Factors:  Caucasian, Low socioeconomic status, Living alone, and Unemployed  Loss Factors: Financial problems/change in socioeconomic status  Historical Factors: Impulsivity  Risk Reduction Factors:   Religious beliefs about death  Continued Clinical Symptoms:  Alcohol/Substance Abuse/Dependencies Unstable or Poor Therapeutic Relationship Previous Psychiatric Diagnoses and Treatments  Cognitive Features That Contribute To Risk:  Closed-mindedness, Loss of executive function, Polarized thinking, and Thought constriction (tunnel vision)    Suicide Risk:  Mild:  Suicidal ideation of limited frequency, intensity, duration, and specificity.  There are no identifiable plans, no associated intent, mild  dysphoria and related symptoms, good self-control (both objective and subjective assessment), few other risk factors, and identifiable protective factors, including available and accessible social support.  Plan Of Care/Follow-up recommendations:  Activity and diet at tolerated.  Please: Take all medications as prescribed by your mental healthcare provider. Report any adverse effects and or reactions from the medicines to your outpatient provider promptly. Do not engage in alcohol and or illegal  drug use while on prescription medicines.  Disposition: Home/self  Signed: Merrily Brittle, DO Psychiatry Resident, PGY-2 [facility] 07/07/2022, 6:24 PM

## 2022-07-07 NOTE — ED Notes (Signed)
Pt asleep in bed. Respirations even and unlabored. Monitoring for safety. 

## 2022-07-07 NOTE — ED Notes (Signed)
Pt in dayroom watching tv.  No complaint of pain or discomfort at this time.  Will continue to monitor for safety.

## 2022-07-07 NOTE — ED Notes (Signed)
Pt resting with eyes closed.  Pt was difficult to speak with this morning, he continued to fall back asleep.  Pt denies SI, HI, and AVH.  Breathing is even and unlabored.  Will continue to monitor for safety.

## 2022-07-07 NOTE — ED Notes (Signed)
Patient A&O x 4, ambulatory. Patient discharged in no acute distress. Patient denied SI/HI, A/VH upon discharge. Patient verbalized understanding of all discharge instructions explained by staff, to include follow up appointments, RX's and safety plan. Patient reported mood 10/10.  Pt belongings returned to patient from locker intact. Patient escorted to lobby via staff with bus pass in hand. Safety maintained.

## 2022-07-07 NOTE — ED Provider Notes (Signed)
Facility Based Crisis Admission H&P  Date: 07/07/22 Patient Name: Brian [name] MRN: [id number] Chief Complaint:  Chief Complaint  Patient presents with   Addiction Problem   Suicidal      Diagnoses:  Final diagnoses:  Suicidal ideation  Substance induced mood disorder ([place])  Homeless single person  Opioid use disorder, severe, dependence ([place])  Amphetamine use disorder, severe, dependence ([place])    HPI:  Brian [name] is a 36 y/o single male with a history of bipolar disorder, suicidal ideation, polysubstance abuse, heroin, methamphetamine and cannabis use presenting voluntarily to [facility] to detox from methamphetamine use. Patient reports that he started using methamphetamine on Monday and last weekend he started snorting Wellbutrin. UDS is positive for amphetamines, opiates, cocaine and methamphetamines.  Patient stated that he is here seeking treatment for his mental health.  Stated that his grandmother died some time ago, he was never able to grieve properly.  Which prompted his substance use.  Patient stated that would lead him to come during this time, was that he was just "tired of being high".  Patient stated that current medications of clonidine taper are not helpful to him, which is why he has been refusing it.  Stated that he has had considerable withdrawal symptoms and cravings, per below.  Patient denied SI/HI/AVH.discussed 988 and 911 and FBC/BHUC. Patient declined to answer further questions at this time.  ROS: SI: No HI:No HYW:VPXT  Mood: Anxious; Dysphoric Sleep:Fair Appetite: Fair Review of Systems  Respiratory:  Negative for cough and shortness of breath.   Cardiovascular:  Negative for chest pain and palpitations.  Gastrointestinal:  Positive for abdominal pain and diarrhea. Negative for nausea and vomiting.  Musculoskeletal:  Positive for myalgias.  Neurological:  Positive for headaches. Negative for dizziness and tremors.     PHQ 2-9:  [facility] ED from 07/06/2022 in [facility] ED from 04/11/2022 in [facility] ED from 03/30/2022 in [facility]  Thoughts that you would be better off dead, or of hurting yourself in some way Not at all Not at all Not at all  PHQ-9 Total Score _0 Flowsheet Row ED from 07/06/2022 in [facility] ED from 07/04/2022 in [facility] ED from 07/03/2022 in [facility]  C-SSRS RISK CATEGORY No Risk No Risk No Risk        Total Time spent with patient: 30 minutes  Musculoskeletal  Strength & Muscle Tone: within normal limits Gait & Station: normal Patient leans: N/A  Psychiatric Specialty Exam  Presentation General Appearance: Disheveled  Eye Contact:Minimal  Speech:Clear and Coherent; Normal Rate  Speech Volume:Normal  Handedness:Right   Mood and Affect  Mood:Anxious; Dysphoric  Affect:Congruent; Appropriate; Restricted   Thought Process  Thought Processes:Goal Directed; Coherent  Descriptions of Associations:Circumstantial  Orientation:Full (Time, Place and Person)  Thought Content:Rumination; Scattered (Avoidant and circumstantial of questions, did not give concrete answers to goals for treatment and stay)  Diagnosis of Schizophrenia or Schizoaffective disorder in past: No   Hallucinations:Hallucinations: None  Ideas of Reference:None  Suicidal Thoughts:Suicidal Thoughts: No SI Passive Intent and/or Plan: Without Intent; Without Plan  Homicidal Thoughts:Homicidal Thoughts: No   Sensorium  Memory:Immediate Good  Judgment:Fair  Insight:Shallow   Executive Functions  Concentration:Fair  Attention Span:Fair  [place]  Language:Good   Psychomotor Activity  Psychomotor Activity:Psychomotor Activity: Restlessness  Assets  Assets:Communication  Skills; Desire for Improvement   Sleep  Sleep:Sleep: Fair Number of Hours of Sleep: 1   Nutritional Assessment (For OBS and FBC admissions only) Has the patient had a weight loss or gain of 10 pounds or more in the last 3 months?: No Has the patient had a decrease in food intake/or appetite?: No Does the patient have dental problems?: No Does the patient have eating habits or behaviors that may be indicators of an eating disorder including binging or inducing vomiting?: No Has the patient recently lost weight without trying?: 0 Has the patient been eating poorly because of a decreased appetite?: 0 Malnutrition Screening Tool Score: 0    Physical Exam Vitals and nursing note reviewed.  HENT:     Head: Normocephalic and atraumatic.  Pulmonary:     Effort: Pulmonary effort is normal. No respiratory distress.  Neurological:     General: No focal deficit present.     Mental Status: He is alert.      Blood pressure 124/89, pulse 73, temperature 98.7 F (37.1 C), resp. rate 18, SpO2 98 %. There is no height or weight on file to calculate BMI.    Is the patient at risk to self? No  Has the patient been a risk to self in the past 6 months? No .    Has the patient been a risk to self within the distant past? No   Is the patient a risk to others? No   Has the patient been a risk to others in the past 6 months? No   Has the patient been a risk to others within the distant past? No  Past Psychiatric History: per below Past Medical History:  Past Medical History:  Diagnosis Date   Alcohol abuse    Anxiety    Bipolar 2 disorder ([place])    Cocaine abuse ([place])    Depression    Kidney stones    Methamphetamine abuse, episodic ([place])    Myocardial infarction ([place])    Narcotic abuse ([place])    Peptic ulcer    Polysubstance abuse ([place])    Renal disorder    kidney stones    Past Surgical History:  Procedure Laterality Date   ESOPHAGOGASTRODUODENOSCOPY N/A 05/29/2014   Procedure:  ESOPHAGOGASTRODUODENOSCOPY (EGD) ;  Surgeon: Beryle Beams, MD;  Location: [facility] ENDOSCOPY;  Service: Endoscopy;  Laterality: N/A;  check with Dr. Collene Mares about sedation type/timinng - I recommend MAC   HAND SURGERY  2006   LEFT HEART CATH AND CORONARY ANGIOGRAPHY N/A 06/21/2020   Procedure: LEFT HEART CATH AND CORONARY ANGIOGRAPHY;  Surgeon: Nelva Bush, MD;  Location: [facility];  Service: Cardiovascular;  Laterality: N/A;    Family History:  Family History  Problem Relation Age of Onset   Heart disease Father     Social History:  Social History   Socioeconomic History   Marital status: Single    Spouse name: Not on file   Number of children: Not on file   Years of education: Not on file   Highest education level: Not on file  Occupational History   Not on file  Tobacco Use   Smoking status: Every Day    Packs/day: 0.50    Types: Cigarettes   Smokeless tobacco: Never  Vaping Use   Vaping Use: Never used  Substance and Sexual Activity   Alcohol use: Not Currently   Drug use: Yes    Types: IV, Methamphetamines   Sexual activity:  Not Currently  Other Topics Concern   Not on file  Social History Narrative   ** Merged History Encounter **       Lives with a friend   Lost house and custody of son 9broke up with fiancee)   Nature conservation officer   Social Determinants of Health   Financial Resource Strain: Not on file  Food Insecurity: Not on file  Transportation Needs: Not on file  Physical Activity: Not on file  Stress: Not on file  Social Connections: Not on file  Intimate Partner Violence: Not on file    SDOH:  [place]: High Risk (08/09/2020)  Alcohol Screen: Low Risk  (12/21/2020)  Depression (PHQ2-9): Medium Risk (07/06/2022)  Tobacco Use: High Risk (07/04/2022)    Last Labs:  Admission on 07/06/2022  Component Date Value Ref Range Status   SARS Coronavirus 2 by RT PCR 07/06/2022 NEGATIVE  NEGATIVE Final   Comment:  (NOTE) SARS-CoV-2 target nucleic acids are NOT DETECTED.  The SARS-CoV-2 RNA is generally detectable in upper respiratory specimens during the acute phase of infection. The lowest concentration of SARS-CoV-2 viral copies this assay can detect is 138 copies/mL. A negative result does not preclude SARS-Cov-2 infection and should not be used as the sole basis for treatment or other patient management decisions. A negative result may occur with  improper specimen collection/handling, submission of specimen other than nasopharyngeal swab, presence of viral mutation(s) within the areas targeted by this assay, and inadequate number of viral copies(<138 copies/mL). A negative result must be combined with clinical observations, patient history, and epidemiological information. The expected result is Negative.  Fact Sheet for Patients:  EntrepreneurPulse.com.au  Fact Sheet for Healthcare Providers:  IncredibleEmployment.be  This test is no                          t yet approved or cleared by the [place] FDA and  has been authorized for detection and/or diagnosis of SARS-CoV-2 by FDA under an Emergency Use Authorization (EUA). This EUA will remain  in effect (meaning this test can be used) for the duration of the COVID-19 declaration under Section 564(b)(1) of the Act, 21 U.S.C.section 360bbb-3(b)(1), unless the authorization is terminated  or revoked sooner.       Influenza A by PCR 07/06/2022 NEGATIVE  NEGATIVE Final   Influenza B by PCR 07/06/2022 NEGATIVE  NEGATIVE Final   Comment: (NOTE) The Xpert Xpress SARS-CoV-2/FLU/RSV plus assay is intended as an aid in the diagnosis of influenza from Nasopharyngeal swab specimens and should not be used as a sole basis for treatment. Nasal washings and aspirates are unacceptable for Xpert Xpress SARS-CoV-2/FLU/RSV testing.  Fact Sheet for Patients: EntrepreneurPulse.com.au  Fact  Sheet for Healthcare Providers: IncredibleEmployment.be  This test is not yet approved or cleared by the [place] FDA and has been authorized for detection and/or diagnosis of SARS-CoV-2 by FDA under an Emergency Use Authorization (EUA). This EUA will remain in effect (meaning this test can be used) for the duration of the COVID-19 declaration under Section 564(b)(1) of the Act, 21 U.S.C. section 360bbb-3(b)(1), unless the authorization is terminated or revoked.  Performed at [facility], [place] [address], [place], [place] [address]    WBC 07/06/2022 10.2  4.0 - 10.5 K/uL Final   RBC 07/06/2022 4.50  4.22 - 5.81 MIL/uL Final   Hemoglobin 07/06/2022 13.4  13.0 - 17.0 g/dL Final   HCT 07/06/2022 39.3  39.0 - 52.0 % Final   MCV 07/06/2022 87.3  80.0 - 100.0 fL Final   MCH 07/06/2022 29.8  26.0 - 34.0 pg Final   MCHC 07/06/2022 34.1  30.0 - 36.0 g/dL Final   RDW 07/06/2022 13.2  11.5 - 15.5 % Final   Platelets 07/06/2022 273  150 - 400 K/uL Final   nRBC 07/06/2022 0.0  0.0 - 0.2 % Final   Neutrophils Relative % 07/06/2022 68  % Final   Neutro Abs 07/06/2022 6.9  1.7 - 7.7 K/uL Final   Lymphocytes Relative 07/06/2022 20  % Final   Lymphs Abs 07/06/2022 2.0  0.7 - 4.0 K/uL Final   Monocytes Relative 07/06/2022 10  % Final   Monocytes Absolute 07/06/2022 1.0  0.1 - 1.0 K/uL Final   Eosinophils Relative 07/06/2022 1  % Final   Eosinophils Absolute 07/06/2022 0.1  0.0 - 0.5 K/uL Final   Basophils Relative 07/06/2022 1  % Final   Basophils Absolute 07/06/2022 0.1  0.0 - 0.1 K/uL Final   Immature Granulocytes 07/06/2022 0  % Final   Abs Immature Granulocytes 07/06/2022 0.03  0.00 - 0.07 K/uL Final   Performed at [facility], [place] [address], [place], [place] [address]   Sodium 07/06/2022 137  135 - 145 mmol/L Final   Potassium 07/06/2022 4.0  3.5 - 5.1 mmol/L Final   Chloride 07/06/2022 102  98 - 111 mmol/L Final   CO2 07/06/2022 25  22 - 32 mmol/L Final    Glucose, Bld 07/06/2022 83  70 - 99 mg/dL Final   Glucose reference range applies only to samples taken after fasting for at least 8 hours.   BUN 07/06/2022 16  6 - 20 mg/dL Final   Creatinine, Ser 07/06/2022 1.18  0.61 - 1.24 mg/dL Final   Calcium 07/06/2022 9.6  8.9 - 10.3 mg/dL Final   Total Protein 07/06/2022 7.2  6.5 - 8.1 g/dL Final   Albumin 07/06/2022 3.9  3.5 - 5.0 g/dL Final   AST 07/06/2022 72 (H)  15 - 41 U/L Final   ALT 07/06/2022 60 (H)  0 - 44 U/L Final   Alkaline Phosphatase 07/06/2022 82  38 - 126 U/L Final   Total Bilirubin 07/06/2022 0.6  0.3 - 1.2 mg/dL Final   GFR, Estimated 07/06/2022 >60  >60 mL/min Final   Comment: (NOTE) Calculated using the CKD-EPI Creatinine Equation (2021)    Anion gap 07/06/2022 10  5 - 15 Final   Performed at [facility] [address], [place], [place] [address]   Alcohol, Ethyl (B) 07/06/2022 <10  <10 mg/dL Final   Comment: (NOTE) Lowest detectable limit for serum alcohol is 10 mg/dL.  For medical purposes only. Performed at [facility], [place] [address] [place], [place] [address]    POC Amphetamine UR 07/06/2022 Positive (PE)  NONE DETECTED (Cut Off Level 1000 ng/mL) Final   POC Secobarbital (BAR) 07/06/2022 None Detected  NONE DETECTED (Cut Off Level 300 ng/mL) Final   POC Buprenorphine (BUP) 07/06/2022 Positive (PE)  NONE DETECTED (Cut Off Level 10 ng/mL) Final   POC Oxazepam (BZO) 07/06/2022 None Detected  NONE DETECTED (Cut Off Level 300 ng/mL) Final   POC Cocaine UR 07/06/2022 None Detected  NONE DETECTED (Cut Off Level 300 ng/mL) Final   POC Methamphetamine UR 07/06/2022 Positive (PE)  NONE DETECTED (Cut Off Level 1000 ng/mL) Final   POC Morphine 07/06/2022 None Detected  NONE DETECTED (Cut Off Level 300  ng/mL) Final   POC Methadone UR 07/06/2022 None Detected  NONE DETECTED (Cut Off Level 300 ng/mL) Final   POC Oxycodone UR 07/06/2022 None Detected  NONE DETECTED (Cut Off Level 100 ng/mL) Final   POC Marijuana  UR 07/06/2022 None Detected  NONE DETECTED (Cut Off Level 50 ng/mL) Final   SARSCOV2ONAVIRUS 2 AG 07/06/2022 NEGATIVE  NEGATIVE Final   Comment: (NOTE) SARS-CoV-2 antigen NOT DETECTED.   Negative results are presumptive.  Negative results do not preclude SARS-CoV-2 infection and should not be used as the sole basis for treatment or other patient management decisions, including infection  control decisions, particularly in the presence of clinical signs and  symptoms consistent with COVID-19, or in those who have been in contact with the virus.  Negative results must be combined with clinical observations, patient history, and epidemiological information. The expected result is Negative.  Fact Sheet for Patients: HandmadeRecipes.com.cy  Fact Sheet for Healthcare Providers: FuneralLife.at  This test is not yet approved or cleared by the [place] FDA and  has been authorized for detection and/or diagnosis of SARS-CoV-2 by FDA under an Emergency Use Authorization (EUA).  This EUA will remain in effect (meaning this test can be used) for the duration of  the COV                          ID-19 declaration under Section 564(b)(1) of the Act, 21 U.S.C. section 360bbb-3(b)(1), unless the authorization is terminated or revoked sooner.     Cholesterol 07/06/2022 137  0 - 200 mg/dL Final   Triglycerides 07/06/2022 56  <150 mg/dL Final   HDL 07/06/2022 45  >40 mg/dL Final   Total CHOL/HDL Ratio 07/06/2022 3.0  RATIO Final   VLDL 07/06/2022 11  0 - 40 mg/dL Final   LDL Cholesterol 07/06/2022 81  0 - 99 mg/dL Final   Comment:        Total Cholesterol/HDL:CHD Risk Coronary Heart Disease Risk Table                     Men   Women  1/2 Average Risk   3.4   3.3  Average Risk       5.0   4.4  2 X Average Risk   9.6   7.1  3 X Average Risk  23.4   11.0        Use the calculated Patient Ratio above and the CHD Risk Table to determine the  patient's CHD Risk.        ATP III CLASSIFICATION (LDL):  <100     mg/dL   Optimal  100-129  mg/dL   Near or Above                    Optimal  130-159  mg/dL   Borderline  160-189  mg/dL   High  >190     mg/dL   Very High Performed at [facility] [address], [place], [place] [address]    TSH 07/06/2022 1.168  0.350 - 4.500 uIU/mL Final   Comment: Performed by a 3rd Generation assay with a functional sensitivity of <=0.01 uIU/mL. Performed at [facility], [place] [address] [place], [place] [address]   Admission on 07/03/2022, Discharged on 07/03/2022  Component Date Value Ref Range Status   SARS Coronavirus 2 by RT PCR 07/03/2022 NEGATIVE  NEGATIVE Final   Comment: (NOTE) SARS-CoV-2 target nucleic  acids are NOT DETECTED.  The SARS-CoV-2 RNA is generally detectable in upper respiratory specimens during the acute phase of infection. The lowest concentration of SARS-CoV-2 viral copies this assay can detect is 138 copies/mL. A negative result does not preclude SARS-Cov-2 infection and should not be used as the sole basis for treatment or other patient management decisions. A negative result may occur with  improper specimen collection/handling, submission of specimen other than nasopharyngeal swab, presence of viral mutation(s) within the areas targeted by this assay, and inadequate number of viral copies(<138 copies/mL). A negative result must be combined with clinical observations, patient history, and epidemiological information. The expected result is Negative.  Fact Sheet for Patients:  EntrepreneurPulse.com.au  Fact Sheet for Healthcare Providers:  IncredibleEmployment.be  This test is no                          t yet approved or cleared by the [place] FDA and  has been authorized for detection and/or diagnosis of SARS-CoV-2 by FDA under an Emergency Use Authorization (EUA). This EUA will remain  in effect  (meaning this test can be used) for the duration of the COVID-19 declaration under Section 564(b)(1) of the Act, 21 U.S.C.section 360bbb-3(b)(1), unless the authorization is terminated  or revoked sooner.       Influenza A by PCR 07/03/2022 NEGATIVE  NEGATIVE Final   Influenza B by PCR 07/03/2022 NEGATIVE  NEGATIVE Final   Comment: (NOTE) The Xpert Xpress SARS-CoV-2/FLU/RSV plus assay is intended as an aid in the diagnosis of influenza from Nasopharyngeal swab specimens and should not be used as a sole basis for treatment. Nasal washings and aspirates are unacceptable for Xpert Xpress SARS-CoV-2/FLU/RSV testing.  Fact Sheet for Patients: EntrepreneurPulse.com.au  Fact Sheet for Healthcare Providers: IncredibleEmployment.be  This test is not yet approved or cleared by the [place] FDA and has been authorized for detection and/or diagnosis of SARS-CoV-2 by FDA under an Emergency Use Authorization (EUA). This EUA will remain in effect (meaning this test can be used) for the duration of the COVID-19 declaration under Section 564(b)(1) of the Act, 21 U.S.C. section 360bbb-3(b)(1), unless the authorization is terminated or revoked.  Performed at [facility], [place] [address] [place], [place] [address]    Alcohol, Ethyl (B) 07/03/2022 <10  <10 mg/dL Final   Comment: (NOTE) Lowest detectable limit for serum alcohol is 10 mg/dL.  For medical purposes only. Performed at [facility], [place] [address], [place], [place] [address]    Opiates 07/03/2022 NONE DETECTED  NONE DETECTED Final   Cocaine 07/03/2022 POSITIVE (A)  NONE DETECTED Final   Benzodiazepines 07/03/2022 NONE DETECTED  NONE DETECTED Final   Amphetamines 07/03/2022 POSITIVE (A)  NONE DETECTED Final   Tetrahydrocannabinol 07/03/2022 NONE DETECTED  NONE DETECTED Final   Barbiturates 07/03/2022 NONE DETECTED  NONE DETECTED Final   Comment:  (NOTE) DRUG SCREEN FOR MEDICAL PURPOSES ONLY.  IF CONFIRMATION IS NEEDED FOR ANY PURPOSE, NOTIFY LAB WITHIN 5 DAYS.  LOWEST DETECTABLE LIMITS FOR URINE DRUG SCREEN Drug Class                     Cutoff (ng/mL) Amphetamine and metabolites    1000 Barbiturate and metabolites    200 Benzodiazepine                 371 Tricyclics and metabolites     300 Opiates and metabolites  300 Cocaine and metabolites        300 THC                            50 Performed at [facility], [place] [address], [place], [place] [address]   Admission on 04/11/2022, Discharged on 04/14/2022  Component Date Value Ref Range Status   SARS Coronavirus 2 by RT PCR 04/11/2022 NEGATIVE  NEGATIVE Final   Comment: (NOTE) SARS-CoV-2 target nucleic acids are NOT DETECTED.  The SARS-CoV-2 RNA is generally detectable in upper respiratory specimens during the acute phase of infection. The lowest concentration of SARS-CoV-2 viral copies this assay can detect is 138 copies/mL. A negative result does not preclude SARS-Cov-2 infection and should not be used as the sole basis for treatment or other patient management decisions. A negative result may occur with  improper specimen collection/handling, submission of specimen other than nasopharyngeal swab, presence of viral mutation(s) within the areas targeted by this assay, and inadequate number of viral copies(<138 copies/mL). A negative result must be combined with clinical observations, patient history, and epidemiological information. The expected result is Negative.  Fact Sheet for Patients:  EntrepreneurPulse.com.au  Fact Sheet for Healthcare Providers:  IncredibleEmployment.be  This test is no                          t yet approved or cleared by the [place] FDA and  has been authorized for detection and/or diagnosis of SARS-CoV-2 by FDA under an Emergency Use Authorization (EUA). This EUA will  remain  in effect (meaning this test can be used) for the duration of the COVID-19 declaration under Section 564(b)(1) of the Act, 21 U.S.C.section 360bbb-3(b)(1), unless the authorization is terminated  or revoked sooner.       Influenza A by PCR 04/11/2022 NEGATIVE  NEGATIVE Final   Influenza B by PCR 04/11/2022 NEGATIVE  NEGATIVE Final   Comment: (NOTE) The Xpert Xpress SARS-CoV-2/FLU/RSV plus assay is intended as an aid in the diagnosis of influenza from Nasopharyngeal swab specimens and should not be used as a sole basis for treatment. Nasal washings and aspirates are unacceptable for Xpert Xpress SARS-CoV-2/FLU/RSV testing.  Fact Sheet for Patients: EntrepreneurPulse.com.au  Fact Sheet for Healthcare Providers: IncredibleEmployment.be  This test is not yet approved or cleared by the [place] FDA and has been authorized for detection and/or diagnosis of SARS-CoV-2 by FDA under an Emergency Use Authorization (EUA). This EUA will remain in effect (meaning this test can be used) for the duration of the COVID-19 declaration under Section 564(b)(1) of the Act, 21 U.S.C. section 360bbb-3(b)(1), unless the authorization is terminated or revoked.  Performed at [facility], [place] [address], [place], [place] [address]    WBC 04/11/2022 13.9 (H)  4.0 - 10.5 K/uL Final   RBC 04/11/2022 4.37  4.22 - 5.81 MIL/uL Final   Hemoglobin 04/11/2022 13.0  13.0 - 17.0 g/dL Final   HCT 04/11/2022 40.0  39.0 - 52.0 % Final   MCV 04/11/2022 91.5  80.0 - 100.0 fL Final   MCH 04/11/2022 29.7  26.0 - 34.0 pg Final   MCHC 04/11/2022 32.5  30.0 - 36.0 g/dL Final   RDW 04/11/2022 15.1  11.5 - 15.5 % Final   Platelets 04/11/2022 322  150 - 400 K/uL Final   nRBC 04/11/2022 0.0  0.0 - 0.2 % Final   Neutrophils Relative % 04/11/2022  71  % Final   Neutro Abs 04/11/2022 9.9 (H)  1.7 - 7.7 K/uL Final   Lymphocytes Relative 04/11/2022 18  % Final   Lymphs Abs  04/11/2022 2.5  0.7 - 4.0 K/uL Final   Monocytes Relative 04/11/2022 8  % Final   Monocytes Absolute 04/11/2022 1.1 (H)  0.1 - 1.0 K/uL Final   Eosinophils Relative 04/11/2022 2  % Final   Eosinophils Absolute 04/11/2022 0.3  0.0 - 0.5 K/uL Final   Basophils Relative 04/11/2022 0  % Final   Basophils Absolute 04/11/2022 0.0  0.0 - 0.1 K/uL Final   Immature Granulocytes 04/11/2022 1  % Final   Abs Immature Granulocytes 04/11/2022 0.07  0.00 - 0.07 K/uL Final   Performed at [facility], [place] [address] [place], [place] [address]   Sodium 04/11/2022 138  135 - 145 mmol/L Final   Potassium 04/11/2022 4.3  3.5 - 5.1 mmol/L Final   Chloride 04/11/2022 104  98 - 111 mmol/L Final   CO2 04/11/2022 24  22 - 32 mmol/L Final   Glucose, Bld 04/11/2022 80  70 - 99 mg/dL Final   Glucose reference range applies only to samples taken after fasting for at least 8 hours.   BUN 04/11/2022 12  6 - 20 mg/dL Final   Creatinine, Ser 04/11/2022 1.17  0.61 - 1.24 mg/dL Final   Calcium 04/11/2022 9.2  8.9 - 10.3 mg/dL Final   Total Protein 04/11/2022 6.9  6.5 - 8.1 g/dL Final   Albumin 04/11/2022 3.9  3.5 - 5.0 g/dL Final   AST 04/11/2022 33  15 - 41 U/L Final   ALT 04/11/2022 47 (H)  0 - 44 U/L Final   Alkaline Phosphatase 04/11/2022 69  38 - 126 U/L Final   Total Bilirubin 04/11/2022 0.5  0.3 - 1.2 mg/dL Final   GFR, Estimated 04/11/2022 >60  >60 mL/min Final   Comment: (NOTE) Calculated using the CKD-EPI Creatinine Equation (2021)    Anion gap 04/11/2022 10  5 - 15 Final   Performed at [facility] [address] [place], [place] [address]   POC Amphetamine UR 04/11/2022 None Detected  NONE DETECTED (Cut Off Level 1000 ng/mL) Final   POC Secobarbital (BAR) 04/11/2022 None Detected  NONE DETECTED (Cut Off Level 300 ng/mL) Final   POC Buprenorphine (BUP) 04/11/2022 None Detected  NONE DETECTED (Cut Off Level 10 ng/mL) Final   POC Oxazepam (BZO) 04/11/2022 None Detected  NONE DETECTED (Cut Off  Level 300 ng/mL) Final   POC Cocaine UR 04/11/2022 None Detected  NONE DETECTED (Cut Off Level 300 ng/mL) Final   POC Methamphetamine UR 04/11/2022 None Detected  NONE DETECTED (Cut Off Level 1000 ng/mL) Final   POC Morphine 04/11/2022 None Detected  NONE DETECTED (Cut Off Level 300 ng/mL) Final   POC Methadone UR 04/11/2022 None Detected  NONE DETECTED (Cut Off Level 300 ng/mL) Final   POC Oxycodone UR 04/11/2022 None Detected  NONE DETECTED (Cut Off Level 100 ng/mL) Final   POC Marijuana UR 04/11/2022 None Detected  NONE DETECTED (Cut Off Level 50 ng/mL) Final  Admission on 03/30/2022, Discharged on 04/01/2022  Component Date Value Ref Range Status   SARS Coronavirus 2 by RT PCR 03/30/2022 NEGATIVE  NEGATIVE Final   Comment: (NOTE) SARS-CoV-2 target nucleic acids are NOT DETECTED.  The SARS-CoV-2 RNA is generally detectable in upper respiratory specimens during the acute phase of infection. The lowest concentration of SARS-CoV-2 viral copies this assay can  detect is 138 copies/mL. A negative result does not preclude SARS-Cov-2 infection and should not be used as the sole basis for treatment or other patient management decisions. A negative result may occur with  improper specimen collection/handling, submission of specimen other than nasopharyngeal swab, presence of viral mutation(s) within the areas targeted by this assay, and inadequate number of viral copies(<138 copies/mL). A negative result must be combined with clinical observations, patient history, and epidemiological information. The expected result is Negative.  Fact Sheet for Patients:  EntrepreneurPulse.com.au  Fact Sheet for Healthcare Providers:  IncredibleEmployment.be  This test is no                          t yet approved or cleared by the [place] FDA and  has been authorized for detection and/or diagnosis of SARS-CoV-2 by FDA under an Emergency Use Authorization (EUA).  This EUA will remain  in effect (meaning this test can be used) for the duration of the COVID-19 declaration under Section 564(b)(1) of the Act, 21 U.S.C.section 360bbb-3(b)(1), unless the authorization is terminated  or revoked sooner.       Influenza A by PCR 03/30/2022 NEGATIVE  NEGATIVE Final   Influenza B by PCR 03/30/2022 NEGATIVE  NEGATIVE Final   Comment: (NOTE) The Xpert Xpress SARS-CoV-2/FLU/RSV plus assay is intended as an aid in the diagnosis of influenza from Nasopharyngeal swab specimens and should not be used as a sole basis for treatment. Nasal washings and aspirates are unacceptable for Xpert Xpress SARS-CoV-2/FLU/RSV testing.  Fact Sheet for Patients: EntrepreneurPulse.com.au  Fact Sheet for Healthcare Providers: IncredibleEmployment.be  This test is not yet approved or cleared by the [place] FDA and has been authorized for detection and/or diagnosis of SARS-CoV-2 by FDA under an Emergency Use Authorization (EUA). This EUA will remain in effect (meaning this test can be used) for the duration of the COVID-19 declaration under Section 564(b)(1) of the Act, 21 U.S.C. section 360bbb-3(b)(1), unless the authorization is terminated or revoked.  Performed at [facility], [place] [address] [place], [place] [address]    POC Amphetamine UR 03/30/2022 Positive (A)  NONE DETECTED (Cut Off Level 1000 ng/mL) Final   POC Secobarbital (BAR) 03/30/2022 None Detected  NONE DETECTED (Cut Off Level 300 ng/mL) Final   POC Buprenorphine (BUP) 03/30/2022 None Detected  NONE DETECTED (Cut Off Level 10 ng/mL) Final   POC Oxazepam (BZO) 03/30/2022 None Detected  NONE DETECTED (Cut Off Level 300 ng/mL) Final   POC Cocaine UR 03/30/2022 None Detected  NONE DETECTED (Cut Off Level 300 ng/mL) Final   POC Methamphetamine UR 03/30/2022 Positive (A)  NONE DETECTED (Cut Off Level 1000 ng/mL) Final   POC Morphine 03/30/2022 None Detected  NONE  DETECTED (Cut Off Level 300 ng/mL) Final   POC Methadone UR 03/30/2022 None Detected  NONE DETECTED (Cut Off Level 300 ng/mL) Final   POC Oxycodone UR 03/30/2022 None Detected  NONE DETECTED (Cut Off Level 100 ng/mL) Final   POC Marijuana UR 03/30/2022 Positive (A)  NONE DETECTED (Cut Off Level 50 ng/mL) Final   SARSCOV2ONAVIRUS 2 AG 03/30/2022 NEGATIVE  NEGATIVE Final   Comment: (NOTE) SARS-CoV-2 antigen NOT DETECTED.   Negative results are presumptive.  Negative results do not preclude SARS-CoV-2 infection and should not be used as the sole basis for treatment or other patient management decisions, including infection  control decisions, particularly in the presence of clinical signs and  symptoms consistent with COVID-19, or  in those who have been in contact with the virus.  Negative results must be combined with clinical observations, patient history, and epidemiological information. The expected result is Negative.  Fact Sheet for Patients: HandmadeRecipes.com.cy  Fact Sheet for Healthcare Providers: FuneralLife.at  This test is not yet approved or cleared by the [place] FDA and  has been authorized for detection and/or diagnosis of SARS-CoV-2 by FDA under an Emergency Use Authorization (EUA).  This EUA will remain in effect (meaning this test can be used) for the duration of  the COV                          ID-19 declaration under Section 564(b)(1) of the Act, 21 U.S.C. section 360bbb-3(b)(1), unless the authorization is terminated or revoked sooner.    Admission on 03/29/2022, Discharged on 03/30/2022  Component Date Value Ref Range Status   Sodium 03/29/2022 142  135 - 145 mmol/L Final   Potassium 03/29/2022 3.5  3.5 - 5.1 mmol/L Final   Chloride 03/29/2022 110  98 - 111 mmol/L Final   CO2 03/29/2022 24  22 - 32 mmol/L Final   Glucose, Bld 03/29/2022 108 (H)  70 - 99 mg/dL Final   Glucose reference range applies only  to samples taken after fasting for at least 8 hours.   BUN 03/29/2022 13  6 - 20 mg/dL Final   Creatinine, Ser 03/29/2022 1.19  0.61 - 1.24 mg/dL Final   Calcium 03/29/2022 9.2  8.9 - 10.3 mg/dL Final   Total Protein 03/29/2022 7.7  6.5 - 8.1 g/dL Final   Albumin 03/29/2022 4.2  3.5 - 5.0 g/dL Final   AST 03/29/2022 61 (H)  15 - 41 U/L Final   ALT 03/29/2022 48 (H)  0 - 44 U/L Final   Alkaline Phosphatase 03/29/2022 64  38 - 126 U/L Final   Total Bilirubin 03/29/2022 0.9  0.3 - 1.2 mg/dL Final   GFR, Estimated 03/29/2022 >60  >60 mL/min Final   Comment: (NOTE) Calculated using the CKD-EPI Creatinine Equation (2021)    Anion gap 03/29/2022 8  5 - 15 Final   Performed at [facility], [place] [address], [place], [place] [address]   Alcohol, Ethyl (B) 03/29/2022 <10  <10 mg/dL Final   Comment: (NOTE) Lowest detectable limit for serum alcohol is 10 mg/dL.  For medical purposes only. Performed at [facility], [place] [address], [place], [place] [address]    Salicylate Lvl 16/57/9038 <7.0 (L)  7.0 - 30.0 mg/dL Final   Performed at [facility] [address] [place], [place] [address]   Acetaminophen (Tylenol), Serum 03/29/2022 <10 (L)  10 - 30 ug/mL Final   Comment: (NOTE) Therapeutic concentrations vary significantly. A range of 10-30 ug/mL  may be an effective concentration for many patients. However, some  are best treated at concentrations outside of this range. Acetaminophen concentrations >150 ug/mL at 4 hours after ingestion  and >50 ug/mL at 12 hours after ingestion are often associated with  toxic reactions.  Performed at [facility], [place] [address], [place], [place] [address]    WBC 03/29/2022 7.5  4.0 - 10.5 K/uL Final   RBC 03/29/2022 4.21 (L)  4.22 - 5.81 MIL/uL Final   Hemoglobin 03/29/2022 12.2 (L)  13.0 - 17.0 g/dL Final   HCT 03/29/2022 38.1 (L)  39.0 - 52.0 % Final   MCV 03/29/2022 90.5   80.0 - 100.0  fL Final   MCH 03/29/2022 29.0  26.0 - 34.0 pg Final   MCHC 03/29/2022 32.0  30.0 - 36.0 g/dL Final   RDW 03/29/2022 15.3  11.5 - 15.5 % Final   Platelets 03/29/2022 228  150 - 400 K/uL Final   nRBC 03/29/2022 0.0  0.0 - 0.2 % Final   Performed at [facility], [place] [address] [place], [place] [address]   Opiates 03/29/2022 NONE DETECTED  NONE DETECTED Final   Cocaine 03/29/2022 NONE DETECTED  NONE DETECTED Final   Benzodiazepines 03/29/2022 NONE DETECTED  NONE DETECTED Final   Amphetamines 03/29/2022 POSITIVE (A)  NONE DETECTED Final   Tetrahydrocannabinol 03/29/2022 POSITIVE (A)  NONE DETECTED Final   Barbiturates 03/29/2022 NONE DETECTED  NONE DETECTED Final   Comment: (NOTE) DRUG SCREEN FOR MEDICAL PURPOSES ONLY.  IF CONFIRMATION IS NEEDED FOR ANY PURPOSE, NOTIFY LAB WITHIN 5 DAYS.  LOWEST DETECTABLE LIMITS FOR URINE DRUG SCREEN Drug Class                     Cutoff (ng/mL) Amphetamine and metabolites    1000 Barbiturate and metabolites    200 Benzodiazepine                 856 Tricyclics and metabolites     300 Opiates and metabolites        300 Cocaine and metabolites        300 THC                            50 Performed at [facility], [place] [address] [place], [place] [address]    Troponin I (High Sensitivity) 03/29/2022 3  <18 ng/L Final   Comment: (NOTE) Elevated high sensitivity troponin I (hsTnI) values and significant  changes across serial measurements may suggest ACS but many other  chronic and acute conditions are known to elevate hsTnI results.  Refer to the "Links" section for chest pain algorithms and additional  guidance. Performed at [facility], [place] [address] [place], [place] [address]    SARS Coronavirus 2 by RT PCR 03/29/2022 NEGATIVE  NEGATIVE Final   Comment: (NOTE) SARS-CoV-2 target nucleic acids are NOT DETECTED.  The SARS-CoV-2 RNA is generally detectable in upper and  lower respiratory specimens during the acute phase of infection. The lowest concentration of SARS-CoV-2 viral copies this assay can detect is 250 copies / mL. A negative result does not preclude SARS-CoV-2 infection and should not be used as the sole basis for treatment or other patient management decisions.  A negative result may occur with improper specimen collection / handling, submission of specimen other than nasopharyngeal swab, presence of viral mutation(s) within the areas targeted by this assay, and inadequate number of viral copies (<250 copies / mL). A negative result must be combined with clinical observations, patient history, and epidemiological information.  Fact Sheet for Patients:   https://www.patel.info/  Fact Sheet for Healthcare Providers: https://hall.com/  This test is not yet approved or                           cleared by the [place] FDA and has been authorized for detection and/or diagnosis of SARS-CoV-2 by FDA under an Emergency Use Authorization (EUA).  This EUA will remain in effect (meaning this test can be used) for the duration of the COVID-19 declaration under Section  564(b)(1) of the Act, 21 U.S.C. section 360bbb-3(b)(1), unless the authorization is terminated or revoked sooner.  Performed at [facility], [place] [address] [place], [place] [address]    SARS Coronavirus 2 by RT PCR 03/30/2022 NEGATIVE  NEGATIVE Final   Comment: (NOTE) SARS-CoV-2 target nucleic acids are NOT DETECTED.  The SARS-CoV-2 RNA is generally detectable in upper respiratory specimens during the acute phase of infection. The lowest concentration of SARS-CoV-2 viral copies this assay can detect is 138 copies/mL. A negative result does not preclude SARS-Cov-2 infection and should not be used as the sole basis for treatment or other patient management decisions. A negative result may occur with  improper  specimen collection/handling, submission of specimen other than nasopharyngeal swab, presence of viral mutation(s) within the areas targeted by this assay, and inadequate number of viral copies(<138 copies/mL). A negative result must be combined with clinical observations, patient history, and epidemiological information. The expected result is Negative.  Fact Sheet for Patients:  EntrepreneurPulse.com.au  Fact Sheet for Healthcare Providers:  IncredibleEmployment.be  This test is no                          t yet approved or cleared by the [place] FDA and  has been authorized for detection and/or diagnosis of SARS-CoV-2 by FDA under an Emergency Use Authorization (EUA). This EUA will remain  in effect (meaning this test can be used) for the duration of the COVID-19 declaration under Section 564(b)(1) of the Act, 21 U.S.C.section 360bbb-3(b)(1), unless the authorization is terminated  or revoked sooner.       Influenza A by PCR 03/30/2022 NEGATIVE  NEGATIVE Final   Influenza B by PCR 03/30/2022 NEGATIVE  NEGATIVE Final   Comment: (NOTE) The Xpert Xpress SARS-CoV-2/FLU/RSV plus assay is intended as an aid in the diagnosis of influenza from Nasopharyngeal swab specimens and should not be used as a sole basis for treatment. Nasal washings and aspirates are unacceptable for Xpert Xpress SARS-CoV-2/FLU/RSV testing.  Fact Sheet for Patients: EntrepreneurPulse.com.au  Fact Sheet for Healthcare Providers: IncredibleEmployment.be  This test is not yet approved or cleared by the [place] FDA and has been authorized for detection and/or diagnosis of SARS-CoV-2 by FDA under an Emergency Use Authorization (EUA). This EUA will remain in effect (meaning this test can be used) for the duration of the COVID-19 declaration under Section 564(b)(1) of the Act, 21 U.S.C. section 360bbb-3(b)(1), unless the  authorization is terminated or revoked.  Performed at [facility], [place] [address], [place], [place] [address]     Allergies: Fish allergy and Shellfish allergy  PTA Medications: (Not in a hospital admission)   Long Term Goals: Improvement in symptoms so as ready for discharge  Short Term Goals: Patient will verbalize feelings in meetings with treatment team members., Patient will attend at least of 50% of the groups daily., Pt will complete the PHQ9 on admission, day 3 and discharge., Patient will participate in completing the [employer], Patient will score a low risk of violence for 24 hours prior to discharge, and Patient will take medications as prescribed daily.  Medical Decision Making  Patient presented to [facility] for detox from meth, cocaine, and snorting Wellbutrin. As well as treatment of substance-induced mood d/o.     Recommendations  Based on my evaluation the patient does not appear to have an emergency medical condition.  Brian [name] is a 36 y/o single male  with a history of bipolar disorder, suicidal ideation, polysubstance abuse, heroin, methamphetamine and cannabis use presenting voluntarily to [facility] to detox from methamphetamine use. Patient reports that he started using methamphetamine on Monday and last weekend he started snorting Wellbutrin. UDS is positive for amphetamines, opiates, cocaine and methamphetamines. Patient is wellknown to [facility]. Since admission, patient has declined scheduled clonidine and requested uptitration of seroquel and gabapentin. Concerning for secondary gain, as these Rx does have street value.   Methamphetamine and heroin detox Continued clonidine taper with COWS per protocol Continued comfort PRNs  SIMD Continued seroquel 100 mg qHS Continued gabapentin 200 mg TID Home wellbutrin was d/c Plan to start effexor 37.5 mg on 07/08/2022  Dispo: Residential: Pending  availability Tentative date: pending   Merrily Brittle, DO 07/07/22  6:04 PM

## 2022-07-07 NOTE — Discharge Instructions (Signed)
Dear Brian [name],  Most effective treatment for your mental health disease involves BOTH a psychiatrist AND a therapist Psychiatrist to manage medications Therapist to help identify personal goals, barriers from those goals, and plan to achieve those goals by understanding emotions Please make regular appointments with an outpatient psychiatrist and other doctors once you leave the hospital (if any, otherwise, please see below for resources to make an appointment).  For therapy outside the hospital, please ask for these specific types of therapy: DBT ________________________________________________________  SAFETY  Dial 988 for [place]    Text [phone] for Crisis Text Line:     [facility]:  [address] FIRST FLOOR.  [place], [place] [address].  [phone]  Mobile Crisis Response Teams Listed by counties in vicinity of [place]. [phone] [place] [phone] [place] [phone] [facility] [phone] [place] [phone] Olivia Lopez de Gutierrez. [phone] [place].  [place] [phone] ________________________________________________________  To see which pharmacy near you is the CHEAPEST for certain medications, please use GoodRx. It is free website and has a free phone app.    Also consider looking at [facility] $4.00 or [facility]'s $7.00 prescription list. Both are free to view if googled "[facility]4 prescription" and "public[facility] $7 prescription". These are set prices, no insurance required. [facility]'s low cost medications: $4-$15 for 30days prescriptions or  $10-$38 for 90days prescriptions  ________________________________________________________  For issues with sleep, please use this free app for insomnia called CBT-I. Let your doctors and therapists know so they can help with extra tips and tricks or for guidance and accountability. NO ADDS on the app.     ________________________________________________________  [place], [place] [address] [phone] OUTPATIENT Walk-in information: Please note, all walk-ins are first come & first serve, with limited number of availability.  Please note that to be eligible for services you must bring: ID or a piece of mail with your name [facility] address  Therapist for therapy:  Monday & Wednesdays: Please ARRIVE at 7:15 AM for registration Will START at 8:00 AM Every 1st & 2nd Friday of the month: Please ARRIVE at 10:15 AM for registration Will START at 1 PM - 5 PM  Psychiatrist for medication management: Monday - Friday:  Please ARRIVE at 7:15 AM for registration Will START at 8:00 AM  Regretfully, due to limited availability, please be aware that you may not been seen on the same day as walk-in. Please consider making an appoint or try again. Thank you for your patience and understanding.

## 2022-07-07 NOTE — ED Notes (Signed)
Pt notified of AA group but refused to attend.  Snacks found in pt's room.  Informed pt food items cannot be taken into private room and must remain in cafeteria.  Pt provided no response and returned to sleep.

## 2022-07-07 NOTE — BH IP Treatment Plan (Signed)
Interdisciplinary Treatment and Diagnostic Plan Update  07/07/2022 Time of Session: [place] MRN: [id number]  Diagnosis:  Final diagnoses:  Bipolar disorder, current episode depressed, severe, without psychotic features ([place])  Addiction ([place])  Suicidal ideation     Current Medications:  Current Facility-Administered Medications  Medication Dose Route Frequency Provider Last Rate Last Admin   acetaminophen (TYLENOL) tablet 650 mg  650 mg Oral Q6H PRN Bobbitt, Shalon E, NP   650 mg at 07/06/22 0916   alum & mag hydroxide-simeth (MAALOX/MYLANTA) 200-200-20 MG/5ML suspension 30 mL  30 mL Oral Q4H PRN Bobbitt, Shalon E, NP       cloNIDine (CATAPRES) tablet 0.1 mg  0.1 mg Oral QID Bobbitt, Shalon E, NP   0.1 mg at 07/06/22 2113   Followed by   Derrill Memo ON 07/08/2022] cloNIDine (CATAPRES) tablet 0.1 mg  0.1 mg Oral BH-qamhs Bobbitt, Shalon E, NP       Followed by   Derrill Memo ON 07/11/2022] cloNIDine (CATAPRES) tablet 0.1 mg  0.1 mg Oral QAC breakfast Bobbitt, Shalon E, NP       dicyclomine (BENTYL) tablet 20 mg  20 mg Oral Q6H PRN Bobbitt, Shalon E, NP   20 mg at 07/06/22 0915   gabapentin (NEURONTIN) capsule 200 mg  200 mg Oral TID White, Patrice L, NP   200 mg at 07/07/22 0953   hydrocortisone cream 0.5 %   Topical TID PRN [place], Ene A, NP   Given at 07/06/22 2311   hydrOXYzine (ATARAX) tablet 25 mg  25 mg Oral Q6H PRN Bobbitt, Shalon E, NP   25 mg at 07/06/22 1948   loperamide (IMODIUM) capsule 2-4 mg  2-4 mg Oral PRN Bobbitt, Shalon E, NP       LORazepam (ATIVAN) tablet 1 mg  1 mg Oral Q4H PRN White, Patrice L, NP   1 mg at 07/06/22 1947   magnesium hydroxide (MILK OF MAGNESIA) suspension 30 mL  30 mL Oral Daily PRN Bobbitt, Shalon E, NP       methocarbamol (ROBAXIN) tablet 500 mg  500 mg Oral Q8H PRN Bobbitt, Shalon E, NP   500 mg at 07/06/22 0413   naproxen (NAPROSYN) tablet 500 mg  500 mg Oral BID PRN Bobbitt, Shalon E, NP   500 mg at 07/06/22 0413    neomycin-bacitracin-polymyxin [phone] OINT 1 Application  1 Application Topical TID PRN Bobbitt, Shalon E, NP   1 Application at 78/29/56 1952   OLANZapine zydis (ZYPREXA) disintegrating tablet 5 mg  5 mg Oral Q8H PRN White, Patrice L, NP       ondansetron (ZOFRAN-ODT) disintegrating tablet 4 mg  4 mg Oral Q6H PRN Bobbitt, Shalon E, NP       QUEtiapine (SEROQUEL) tablet 100 mg  100 mg Oral QHS White, Patrice L, NP   100 mg at 07/06/22 2114   traZODone (DESYREL) tablet 50 mg  50 mg Oral QHS PRN Bobbitt, Shalon E, NP       Current Outpatient Medications  Medication Sig Dispense Refill   buPROPion (WELLBUTRIN XL) 300 MG 24 hr tablet Take 1 tablet (300 mg total) by mouth daily. 30 tablet 1   gabapentin (NEURONTIN) 300 MG capsule Take 1 cap PO BID for one week, then take 1 cap PO qhs for one week, then d/c (Patient taking differently: Take 300 mg by mouth 3 (three) times daily. Take 1 cap PO BID for one week, then take 1 cap PO qhs for one week, then d/c) 21 capsule  0   hydrOXYzine (ATARAX) 25 MG tablet Take 1 tablet (25 mg total) by mouth 3 (three) times daily as needed for anxiety. 90 tablet 1   isosorbide mononitrate (IMDUR) 30 MG 24 hr tablet Take 0.5 tablets (15 mg total) by mouth daily. 15 tablet 1   LIPITOR 80 MG tablet Take 1 tablet (80 mg total) by mouth at bedtime. 30 tablet 1   QUEtiapine (SEROQUEL) 200 MG tablet Take 200 mg by mouth at bedtime.     PTA Medications: Prior to Admission medications   Medication Sig Start Date End Date Taking? Authorizing Provider  buPROPion (WELLBUTRIN XL) 300 MG 24 hr tablet Take 1 tablet (300 mg total) by mouth daily. 05/06/22  Yes Mayers, Cari S, PA-C  gabapentin (NEURONTIN) 300 MG capsule Take 1 cap PO BID for one week, then take 1 cap PO qhs for one week, then d/c Patient taking differently: Take 300 mg by mouth 3 (three) times daily. Take 1 cap PO BID for one week, then take 1 cap PO qhs for one week, then d/c 05/06/22  Yes Mayers, Cari S, PA-C   hydrOXYzine (ATARAX) 25 MG tablet Take 1 tablet (25 mg total) by mouth 3 (three) times daily as needed for anxiety. 05/06/22  Yes Mayers, Cari S, PA-C  isosorbide mononitrate (IMDUR) 30 MG 24 hr tablet Take 0.5 tablets (15 mg total) by mouth daily. 05/06/22  Yes Mayers, Cari S, PA-C  LIPITOR 80 MG tablet Take 1 tablet (80 mg total) by mouth at bedtime. 05/06/22  Yes Mayers, Cari S, PA-C  QUEtiapine (SEROQUEL) 200 MG tablet Take 200 mg by mouth at bedtime.   Yes [provider]  FLUoxetine (PROZAC) 20 MG capsule Take 1 capsule (20 mg total) by mouth daily. For depression Patient not taking: Reported on 11/04/2019 04/01/18 05/04/20  Lindell Spar I, NP  sucralfate (CARAFATE) 1 g tablet Take 1 tablet (1 g total) by mouth 4 (four) times daily -  with meals and at bedtime. Patient not taking: Reported on 11/04/2019 08/30/19 05/04/20  Lorayne Bender, PA-C    Patient Stressors: Substance Use; cannot return to [facility] until after two weeks; still grieving the loss of his grandmother in September 2022 - his birthday is a trigger for him; conflict with current girlfriend; estranged relationship with his parents; poor insight.  Patient Strengths: Sister is supportive of his recovery and he has a good relationship with her.  He has the support of his NA family.  He is employed.  He states he has a home to go to.  Treatment Modalities: Medication Management, Group therapy, Case management,  1 to 1 session with clinician, Psychoeducation, Recreational therapy.   Physician Treatment Plan for Primary and Secondary Diagnosis:  Final diagnoses:  Bipolar disorder, current episode depressed, severe, without psychotic features ([place])  Addiction ([place])  Suicidal ideation   Long Term Goal(s):    Short Term Goals:    Medication Management: Evaluate patient's response, side effects, and tolerance of medication regimen.  Therapeutic Interventions: 1 to 1 sessions, Unit Group sessions and Medication  administration.  Evaluation of Outcomes: Not Progressing  LCSW Treatment Plan for Primary Diagnosis:  Final diagnoses:  Bipolar disorder, current episode depressed, severe, without psychotic features ([place])  Addiction ([place])  Suicidal ideation    Long Term Goal(s): Safe transition to appropriate next level of care at discharge.  Short Term Goals: Facilitate acceptance of mental health diagnosis and concerns through verbal commitment to aftercare plan and appointments at discharge.,  Patient will identify one social support prior to discharge to aid in patient's recovery., Patient will attend AA/NA groups as scheduled., Identify minimum of 2 triggers associated with mental health/substance abuse issues with treatment team members., and Increase skills for wellness and recovery by attending 50% of scheduled groups.  Therapeutic Interventions: Assess for all discharge needs, 1 to 1 time with Education officer, museum, Explore available resources and support systems, Assess for adequacy in community support network, Educate family and significant other(s) on suicide prevention, Complete Psychosocial Assessment, Interpersonal group therapy.  Evaluation of Outcomes: Not Progressing - so far, there has been difficulty in getting the pt aroused to attend the AA/NA meeting this morning.   Progress in Treatment: Attending groups: Only for the last few minutes. Participating in groups: No Taking medication as prescribed: No. Toleration medication: No. Family/Significant other contact made: No Patient understands diagnosis: Yes. Discussing patient identified problems/goals with staff: Yes. Medical problems stabilized or resolved: No. Denies suicidal/homicidal ideation: Yes. Issues/concerns per patient self-inventory: Yes. Other: Medical concern regarding to pt breaking out into hives during the first day of admission.  New problem(s) identified: No  New Short Term/Long Term Goal(s):  To detox off of meth and  suboxone.  Patient Goals:    Discharge Plan or Barriers: Discussed residential/rehab options but is not stating that all he wants is detox.  Reason for Continuation of Hospitalization: Suicidal ideation Withdrawal symptoms  Estimated Length of Stay:  Last 3 [place] Suicide Severity Risk Score: [place] ED from 07/06/2022 in [facility] ED from 07/04/2022 in [place] DEPT ED from 07/03/2022 in [place] DEPT  C-SSRS RISK CATEGORY No Risk No Risk No Risk       Last PHQ 2/9 Scores:    07/06/2022    2:45 AM 04/12/2022   11:15 AM 03/31/2022    3:32 PM  Depression screen PHQ 2/9  Decreased Interest 1 0 3  Down, Depressed, Hopeless 1 1 1   PHQ - 2 Score 2 1 4   Altered sleeping 1 0 0  Tired, decreased energy  1 0  Change in appetite 2    Feeling bad or failure about yourself  2 1 1   Trouble concentrating 1 0 0  Moving slowly or fidgety/restless 1 0 0  Suicidal thoughts 1 0 0  PHQ-9 Score 10 3 5   Difficult doing work/chores Somewhat difficult Somewhat difficult Somewhat difficult    Scribe for Treatment Team: Kerri Perches, LCSW 07/07/2022 1:34 PM

## 2022-08-01 ENCOUNTER — Emergency Department (HOSPITAL_COMMUNITY)
Admission: EM | Admit: 2022-08-01 | Discharge: 2022-08-03 | Disposition: A | Discharge status: Home / Self Care | Payer: BLUE CROSS/BLUE SHIELD | Attending: Emergency Medicine | Admitting: Emergency Medicine

## 2022-08-01 ENCOUNTER — Other Ambulatory Visit: Payer: Self-pay

## 2022-08-01 ENCOUNTER — Encounter (HOSPITAL_COMMUNITY): Payer: Self-pay | Admitting: Emergency Medicine

## 2022-08-01 DIAGNOSIS — F1994 Other psychoactive substance use, unspecified with psychoactive substance-induced mood disorder: Secondary | ICD-10-CM | POA: Diagnosis present

## 2022-08-01 DIAGNOSIS — F1910 Other psychoactive substance abuse, uncomplicated: Secondary | ICD-10-CM | POA: Diagnosis not present

## 2022-08-01 DIAGNOSIS — F3181 Bipolar II disorder: Secondary | ICD-10-CM | POA: Diagnosis not present

## 2022-08-01 DIAGNOSIS — F1914 Other psychoactive substance abuse with psychoactive substance-induced mood disorder: Secondary | ICD-10-CM | POA: Insufficient documentation

## 2022-08-01 LAB — COMPREHENSIVE METABOLIC PANEL
ALT: 29 U/L (ref 0–44)
AST: 38 U/L (ref 15–41)
Albumin: 3.8 g/dL (ref 3.5–5.0)
Alkaline Phosphatase: 64 U/L (ref 38–126)
Anion gap: 9 (ref 5–15)
BUN: 12 mg/dL (ref 6–20)
CO2: 22 mmol/L (ref 22–32)
Calcium: 8.9 mg/dL (ref 8.9–10.3)
Chloride: 111 mmol/L (ref 98–111)
Creatinine, Ser: 1.5 mg/dL — ABNORMAL HIGH (ref 0.61–1.24)
GFR, Estimated: >60 mL/min (ref >60)
Glucose, Bld: 94 mg/dL (ref 70–99)
Potassium: 3.8 mmol/L (ref 3.5–5.1)
Sodium: 142 mmol/L (ref 135–145)
Total Bilirubin: 0.7 mg/dL (ref 0.3–1.2)
Total Protein: 6.9 g/dL (ref 6.5–8.1)

## 2022-08-01 LAB — RAPID URINE DRUG SCREEN, HOSP PERFORMED
Amphetamines: POSITIVE — AB
Barbiturates: NOT DETECTED
Benzodiazepines: NOT DETECTED
Cocaine: POSITIVE — AB
Opiates: NOT DETECTED
Tetrahydrocannabinol: POSITIVE — AB

## 2022-08-01 LAB — CBC WITH DIFFERENTIAL/PLATELET
Abs Immature Granulocytes: 0.03 10*3/uL (ref 0.00–0.07)
Basophils Absolute: 0.1 10*3/uL (ref 0.0–0.1)
Basophils Relative: 0 %
Eosinophils Absolute: 0.2 10*3/uL (ref 0.0–0.5)
Eosinophils Relative: 2 %
HCT: 38.4 % — ABNORMAL LOW (ref 39.0–52.0)
Hemoglobin: 12.5 g/dL — ABNORMAL LOW (ref 13.0–17.0)
Immature Granulocytes: 0 %
Lymphocytes Relative: 10 %
Lymphs Abs: 1.2 10*3/uL (ref 0.7–4.0)
MCH: 30 pg (ref 26.0–34.0)
MCHC: 32.6 g/dL (ref 30.0–36.0)
MCV: 92.3 fL (ref 80.0–100.0)
Monocytes Absolute: 1.1 10*3/uL — ABNORMAL HIGH (ref 0.1–1.0)
Monocytes Relative: 10 %
Neutro Abs: 8.7 10*3/uL — ABNORMAL HIGH (ref 1.7–7.7)
Neutrophils Relative %: 78 %
Platelets: 247 10*3/uL (ref 150–400)
RBC: 4.16 MIL/uL — ABNORMAL LOW (ref 4.22–5.81)
RDW: 15.2 % (ref 11.5–15.5)
WBC: 11.3 10*3/uL — ABNORMAL HIGH (ref 4.0–10.5)
nRBC: 0 % (ref 0.0–0.2)

## 2022-08-01 LAB — ETHANOL: Alcohol, Ethyl (B): <10 mg/dL (ref <10)

## 2022-08-01 MED ORDER — LORAZEPAM 2 MG/ML IJ SOLN
1.0000 mg | Freq: Once | INTRAMUSCULAR | Status: AC
  Administered 2022-08-01: 1 mg via INTRAMUSCULAR
  Filled 2022-08-01: qty 1

## 2022-08-01 NOTE — ED Notes (Signed)
IVC paperwork completed. [place] [facility].

## 2022-08-01 NOTE — BH Assessment (Signed)
Received TTS consult order. Per RN, Pt was given medication and is currently too somnolent to participate in tele-assessment. Pt will be assessed when able to participate.   Evelena Peat, [facility], [facility] Triage Specialist [phone]

## 2022-08-01 NOTE — ED Provider Triage Note (Signed)
Emergency Medicine Provider Triage Evaluation Note  Brian [name] , a 36 y.o. male  was evaluated in triage.  Pt complains of reaction to drugs.  Patient was brought in by police.  He was found sitting on a roof with manic type behavior.  Patient admits to drug use this evening.  He denies wanting to hurt himself.  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  Review of Systems  Positive:  Negative:   Physical Exam  BP (!) 142/94   Pulse 93   Temp 98.7 F (37.1 C) (Oral)   Resp 20   SpO2 97%  Gen:   Awake, pacing Resp:  Normal effort  MSK:   Moves extremities without difficulty  Other:  Rapid and pressured speech, pacing, tangential, denies suicidal ideation, cooperative  Medical Decision Making  Medically screening exam initiated at 8:38 PM.  Appropriate orders placed.  HAMLIN DEVINE was informed that the remainder of the evaluation will be completed by another provider, this initial triage assessment does not replace that evaluation, and the importance of remaining in the ED until their evaluation is complete.     Brian Rank, MD 08/01/22 2040

## 2022-08-01 NOTE — ED Triage Notes (Signed)
Pt brought to ED by [facility] for psychiatric evaluation after being found on roof. Pt presents as manic and is pacing back and forth at time and has rapid speech.    EMS Vitals BP 160/100 HR 100 SPO2 98% RA CBG 116

## 2022-08-01 NOTE — ED Provider Notes (Signed)
[facility] EMERGENCY DEPARTMENT Provider Note   CSN: [id number] Arrival date & time: 08/01/22  2016     History  Chief Complaint  Patient presents with   Psychiatric Evaluation    FLEET HIGHAM is a 36 y.o. male.  HPI     HUZAIFA VINEY , a 36 y.o. male  was evaluated in triage by me.  Pt complains of reaction to drugs.  Patient was brought in by police.  He was found sitting on a roof with manic type behavior.  Patient admits to drug use this evening.  He denies wanting to hurt himself.  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 Patient was placed under IVC.  He was noted to be sweating, hyperactive he was agitated and yelling.  He told police that he did use a bath salts.  Home Medications Prior to Admission medications   Medication Sig Start Date End Date Taking? Authorizing Provider  gabapentin (NEURONTIN) 100 MG capsule Take 2 capsules (200 mg total) by mouth 3 (three) times daily. 07/07/22 08/06/22  Merrily Brittle, DO  hydrOXYzine (ATARAX) 25 MG tablet Take 1 tablet (25 mg total) by mouth 3 (three) times daily as needed for anxiety. 05/06/22   Mayers, Cari S, PA-C  isosorbide mononitrate (IMDUR) 30 MG 24 hr tablet Take 0.5 tablets (15 mg total) by mouth daily. 05/06/22   Mayers, Cari S, PA-C  LIPITOR 80 MG tablet Take 1 tablet (80 mg total) by mouth at bedtime. 05/06/22   Mayers, Cari S, PA-C  QUEtiapine (SEROQUEL) 100 MG tablet Take 1 tablet (100 mg total) by mouth at bedtime. 07/07/22 08/06/22  Merrily Brittle, DO  FLUoxetine (PROZAC) 20 MG capsule Take 1 capsule (20 mg total) by mouth daily. For depression Patient not taking: Reported on 11/04/2019 04/01/18 05/04/20  Lindell Spar I, NP  sucralfate (CARAFATE) 1 g tablet Take 1 tablet (1 g total) by mouth 4 (four) times daily -  with meals and at bedtime. Patient not taking: Reported on 11/04/2019 08/30/19 05/04/20  Lorayne Bender, PA-C      Allergies    Fish allergy and Shellfish allergy     Review of Systems   Review of Systems  Physical Exam Updated Vital Signs BP (!) 142/94   Pulse 93   Temp 98.7 F (37.1 C) (Oral)   Resp 20   SpO2 97%  Physical Exam Vitals and nursing note reviewed.  Constitutional:      General: He is not in acute distress.    Appearance: He is well-developed.  HENT:     Head: Normocephalic and atraumatic.     Right Ear: External ear normal.     Left Ear: External ear normal.  Eyes:     General: No scleral icterus.       Right eye: No discharge.        Left eye: No discharge.     Conjunctiva/sclera: Conjunctivae normal.  Neck:     Trachea: No tracheal deviation.  Cardiovascular:     Rate and Rhythm: Normal rate.  Pulmonary:     Effort: Pulmonary effort is normal. No respiratory distress.     Breath sounds: No stridor.  Abdominal:     General: There is no distension.  Musculoskeletal:        General: No swelling or deformity.     Cervical back: Neck supple.  Skin:    General: Skin is warm and dry.     Findings: No rash.  Neurological:  Mental Status: He is alert.     Cranial Nerves: Cranial nerve deficit: no gross deficits.  Psychiatric:        Mood and Affect: Mood is not elated. Affect is labile.        Speech: Speech is rapid and pressured and tangential.        Behavior: Behavior is hyperactive. Behavior is not aggressive or combative.        Thought Content: Thought content is paranoid. Thought content does not include homicidal or suicidal ideation.     ED Results / Procedures / Treatments   Labs (all labs ordered are listed, but only abnormal results are displayed) Labs Reviewed  RESP PANEL BY RT-PCR (FLU A&B, COVID) ARPGX2  COMPREHENSIVE METABOLIC PANEL  ETHANOL  RAPID URINE DRUG SCREEN, HOSP PERFORMED  CBC WITH DIFFERENTIAL/PLATELET    EKG None  Radiology No results found.  Procedures Procedures    Medications Ordered in ED Medications  LORazepam (ATIVAN) injection 1 mg (1 mg Intramuscular  Given 08/01/22 2044)    ED Course/ Medical Decision Making/ A&P                           Medical Decision Making Amount and/or Complexity of Data Reviewed Labs: ordered.  Risk Prescription drug management.  Patient admits to drug use today.  He appears to be under the influence of a stimulant.  He denies any suicidal homicidal ideation.  Will initiate laboratory testing.  Otherwise appears medically stable at this time. The patient has been placed in psychiatric observation due to the need to provide a safe environment for the patient while obtaining psychiatric consultation and evaluation, as well as ongoing medical and medication management to treat the patient's condition.  The patient has been placed under full IVC at this time. Plan on laboratory evaluation and monitoring.  Psychiatric consultation as he was placed under IVC        Final Clinical Impression(s) / ED Diagnoses Final diagnoses:  Substance abuse [facility])    Rx / DC Orders ED Discharge Orders     None         [name], MD 08/01/22 2057

## 2022-08-02 DIAGNOSIS — F3181 Bipolar II disorder: Secondary | ICD-10-CM

## 2022-08-02 MED ORDER — HYDROXYZINE HCL 25 MG PO TABS
25.0000 mg | ORAL_TABLET | Freq: Three times a day (TID) | ORAL | Status: DC | PRN
  Administered 2022-08-02: 25 mg via ORAL
  Filled 2022-08-02: qty 1

## 2022-08-02 MED ORDER — ATORVASTATIN CALCIUM 40 MG PO TABS
40.0000 mg | ORAL_TABLET | Freq: Every day | ORAL | Status: DC
  Administered 2022-08-02 – 2022-08-03 (×2): 40 mg via ORAL
  Filled 2022-08-02 (×2): qty 1

## 2022-08-02 MED ORDER — QUETIAPINE FUMARATE 100 MG PO TABS
100.0000 mg | ORAL_TABLET | Freq: Every day | ORAL | Status: DC
  Administered 2022-08-02: 100 mg via ORAL
  Filled 2022-08-02: qty 1

## 2022-08-02 MED ORDER — BUPROPION HCL ER (XL) 150 MG PO TB24
300.0000 mg | ORAL_TABLET | Freq: Every day | ORAL | Status: DC
  Administered 2022-08-02 – 2022-08-03 (×2): 300 mg via ORAL
  Filled 2022-08-02 (×2): qty 2

## 2022-08-02 MED ORDER — GABAPENTIN 300 MG PO CAPS
300.0000 mg | ORAL_CAPSULE | Freq: Three times a day (TID) | ORAL | Status: DC
  Administered 2022-08-02 – 2022-08-03 (×4): 300 mg via ORAL
  Filled 2022-08-02 (×5): qty 1

## 2022-08-02 MED ORDER — LORAZEPAM 1 MG PO TABS
2.0000 mg | ORAL_TABLET | Freq: Once | ORAL | Status: AC
  Administered 2022-08-02: 2 mg via ORAL
  Filled 2022-08-02: qty 2

## 2022-08-02 MED ORDER — LORAZEPAM 1 MG PO TABS
1.0000 mg | ORAL_TABLET | Freq: Once | ORAL | Status: AC
  Administered 2022-08-02: 1 mg via ORAL
  Filled 2022-08-02: qty 1

## 2022-08-02 NOTE — ED Provider Notes (Cosign Needed Addendum)
Emergency Medicine Observation Re-evaluation Note  Brian [name] is a 36 y.o. male, seen on rounds today.  Pt initially presented to the ED for complaints of Psychiatric Evaluation Currently, the patient is somewhat anxious and requesting something to help with his anxiety.  He was brought in by police under IVC which was maintained by ED staff.  Denies any pain  Physical Exam  BP 131/80 (BP Location: Right Arm)   Pulse 82   Temp 98.3 F (36.8 C) (Oral)   Resp 16   SpO2 99%  Physical Exam Vitals and nursing note reviewed.  Constitutional:      General: He is not in acute distress.    Appearance: Normal appearance. He is not ill-appearing.  HENT:     Head: Normocephalic and atraumatic.  Eyes:     General: No scleral icterus.       Right eye: No discharge.        Left eye: No discharge.     Conjunctiva/sclera: Conjunctivae normal.  Pulmonary:     Effort: Pulmonary effort is normal.     Breath sounds: No stridor.  Neurological:     Mental Status: He is alert and oriented to person, place, and time. Mental status is at baseline.  Psychiatric:     Comments: Anxious, somewhat pressured speech      ED Course / MDM  EKG:   I have reviewed the labs performed to date as well as medications administered while in observation.  Recent changes in the last 24 hours include some agitation.  Plan  Current plan is for psychiatric evaluation.  Currently under IVC  At the request of RN and patient provided p.o. dose of Ativan.  Also placed order for patient's nightly Seroquel.   Tedd Sias, [place] 08/02/22 1036    Pati Gallo [place], [place] 08/02/22 1039    Blanchie Dessert, MD 08/03/22 1223
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
## 2022-08-02 NOTE — ED Notes (Signed)
TTS at bedside. 

## 2022-08-02 NOTE — ED Notes (Signed)
Pt's belongings placed in locker #3.

## 2022-08-02 NOTE — ED Notes (Signed)
Pt requested to speak with RN and other healthcare providers. Pt stated that he was feeling anxious and a "nark" and felt like his anxiety levels were rising. This RN informed the PA.

## 2022-08-02 NOTE — ED Notes (Signed)
IVC paperwork brought to [facility]

## 2022-08-02 NOTE — ED Notes (Signed)
Pt showered, linen changed, and vitals signs entered.

## 2022-08-02 NOTE — ED Notes (Signed)
Pt tearful, reporting anxiety and requesting ativan. [place] MD made aware

## 2022-08-02 NOTE — ED Triage Notes (Signed)
IVC PAPERWORK EXPIRES ON 08/08/22,NOTIFIED NURSE [facility] RN

## 2022-08-03 DIAGNOSIS — F1910 Other psychoactive substance abuse, uncomplicated: Secondary | ICD-10-CM

## 2022-08-03 MED ORDER — BUPROPION HCL ER (XL) 300 MG PO TB24: 300.0000 mg | ORAL_TABLET | Freq: Every day | ORAL | 0 refills | Status: DC

## 2022-08-03 MED ORDER — GABAPENTIN 100 MG PO CAPS: 100.0000 mg | ORAL_CAPSULE | Freq: Two times a day (BID) | ORAL | 0 refills | Status: DC
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
## 2022-08-03 NOTE — Progress Notes (Signed)
Per Shnese Mills, NP, patient meets criteria for inpatient treatment. There are no available beds at [facility] today. CSW faxed referrals to the following facilities for review:  [facility]  Pending - Request Sent N/A [address] [place] [address] [phone] --  [facility]  Pending - Request Sent N/A [address] [place] [address] [phone] --  [facility]  Pending - Request Sent N/A [address] [place] [address] [phone] --  [facility]  Pending - Request Sent N/A [address], [place] [address] [phone] --  [facility]  Pending - Request Sent N/A [address], [place] [address] [phone] --  [facility]  Pending - Request Sent N/A [address] [place] [address] [phone] --  [facility]  Pending - Request Sent N/A [address] [place] [address] [phone] --  [facility]  Pending - Request Sent N/A [address] [place] [address] [phone] --  [facility]  Pending - Request Sent N/A [address] [place] [address] [phone] --  [facility]  Pending - Request Sent N/A [address], [place] [address] [phone] --  [facility]  Pending - Request Sent N/A [address], [place] [address] [phone] --  [facility]  Pending - Request Sent N/A [address] [place] [address] [phone] --  [facility]  Pending - Request Sent N/A [address], [place] [address] [phone] --  [facility]  Pending - Request Sent N/A [address], [place] [address] [phone] --   TTS will continue to seek bed placement.  Arva Slaugh, MSW, LCSW-A, LCAS Phone: [phone] Disposition/TOC  

## 2022-08-03 NOTE — Care Management (Signed)
Spoke to patient about clothing.  He doesn't have any shoes , all his clothing is at a girls house, he has a case number and will call the police to escort him to obtain his items. CSW looking for items that are available for the patient. SA resources on discharge instructions

## 2022-08-03 NOTE — Discharge Instructions (Addendum)
                  Intensive Outpatient Programs  [facility]    [facility] [address]     [address] [place]     [place], [place] [phone]      [phone]  [facility]   [facility]  (Inpatient and outpatient)  [phone] (Suboxone and Methadone) [address]           [phone]           [facility]    [facility] - Intensive Outpatient [address]     [address] [place], [place] [address]     [place], [place]  [phone]      [phone]  [facility] (Outpatient, Inpatient, [facility]  [facility] (Groups and Residental) (insurance only) [phone]    [place], [place]          [phone]       [facility]    [facility] (for caregivers and family) [address]     [address] [place], [place]     [place], [place] [phone]      [phone]  Residential Treatment Programs  [facility] [facility]  [facility](2 years) Residential: 90 days)  [facility] [address]      [address] [place], [place]     [place], [place] [phone]      [phone] or [phone]  [facility]    [facility] [address]      [address] [place], [place]     [place], [place] [phone]      [phone]  [facility]   [facility] ([facility]) [address]     [address] [place], [place] [address]     [place], [place] [phone]      [phone] Admissions: 8am-3pm M-F  [facility] Program: Residential Program (90 Days)              [facility]: [facility]  [place], [place]     [place], [place]  [phone] or [phone]    (Walk in Hours over the weekend or by referral)   [place]: [facility]:[phone] (for crisis  response 24 hours a day) 

## 2022-08-03 NOTE — ED Provider Notes (Signed)
Patient was initially placed under IVC for manic behavior.  He was evaluated by psychiatry this morning and was psychiatrically cleared for outpatient management.  He was recommended outpatient substance use resources and was recommended to be prescribed gabapentin and Wellbutrin until he is able to follow-up at the behavioral health urgent care or primary care.   Brian Asal K, DO 08/03/22 1108

## 2022-08-09 ENCOUNTER — Other Ambulatory Visit (HOSPITAL_COMMUNITY)
Admission: EM | Admit: 2022-08-09 | Discharge: 2022-08-13 | Disposition: A | Discharge status: Rehabilitation Facility | Payer: BLUE CROSS/BLUE SHIELD | Attending: Psychiatry | Admitting: Psychiatry

## 2022-08-09 DIAGNOSIS — Z59812 Housing instability, housed, homelessness in past 12 months: Secondary | ICD-10-CM

## 2022-08-09 DIAGNOSIS — F1210 Cannabis abuse, uncomplicated: Secondary | ICD-10-CM | POA: Diagnosis not present

## 2022-08-09 DIAGNOSIS — F1410 Cocaine abuse, uncomplicated: Secondary | ICD-10-CM | POA: Diagnosis not present

## 2022-08-09 DIAGNOSIS — F419 Anxiety disorder, unspecified: Secondary | ICD-10-CM | POA: Insufficient documentation

## 2022-08-09 DIAGNOSIS — F3181 Bipolar II disorder: Secondary | ICD-10-CM

## 2022-08-09 DIAGNOSIS — E785 Hyperlipidemia, unspecified: Secondary | ICD-10-CM

## 2022-08-09 DIAGNOSIS — F1120 Opioid dependence, uncomplicated: Secondary | ICD-10-CM

## 2022-08-09 DIAGNOSIS — Z5900 Homelessness unspecified: Secondary | ICD-10-CM | POA: Insufficient documentation

## 2022-08-09 DIAGNOSIS — F17210 Nicotine dependence, cigarettes, uncomplicated: Secondary | ICD-10-CM | POA: Diagnosis not present

## 2022-08-09 DIAGNOSIS — Z1152 Encounter for screening for COVID-19: Secondary | ICD-10-CM | POA: Diagnosis not present

## 2022-08-09 DIAGNOSIS — F1994 Other psychoactive substance use, unspecified with psychoactive substance-induced mood disorder: Secondary | ICD-10-CM | POA: Diagnosis present

## 2022-08-09 DIAGNOSIS — F1920 Other psychoactive substance dependence, uncomplicated: Secondary | ICD-10-CM | POA: Diagnosis not present

## 2022-08-09 LAB — COMPREHENSIVE METABOLIC PANEL
ALT: 23 U/L (ref 0–44)
AST: 23 U/L (ref 15–41)
Albumin: 4 g/dL (ref 3.5–5.0)
Alkaline Phosphatase: 65 U/L (ref 38–126)
Anion gap: 8 (ref 5–15)
BUN: 11 mg/dL (ref 6–20)
CO2: 26 mmol/L (ref 22–32)
Calcium: 9.5 mg/dL (ref 8.9–10.3)
Chloride: 102 mmol/L (ref 98–111)
Creatinine, Ser: 1.26 mg/dL — ABNORMAL HIGH (ref 0.61–1.24)
GFR, Estimated: >60 mL/min (ref >60)
Glucose, Bld: 101 mg/dL — ABNORMAL HIGH (ref 70–99)
Potassium: 4.1 mmol/L (ref 3.5–5.1)
Sodium: 136 mmol/L (ref 135–145)
Total Bilirubin: 0.4 mg/dL (ref 0.3–1.2)
Total Protein: 7.5 g/dL (ref 6.5–8.1)

## 2022-08-09 LAB — CBC WITH DIFFERENTIAL/PLATELET
Abs Immature Granulocytes: 0.07 10*3/uL (ref 0.00–0.07)
Basophils Absolute: 0 10*3/uL (ref 0.0–0.1)
Basophils Relative: 0 %
Eosinophils Absolute: 0.1 10*3/uL (ref 0.0–0.5)
Eosinophils Relative: 1 %
HCT: 43.1 % (ref 39.0–52.0)
Hemoglobin: 14.1 g/dL (ref 13.0–17.0)
Immature Granulocytes: 1 %
Lymphocytes Relative: 12 %
Lymphs Abs: 1.6 10*3/uL (ref 0.7–4.0)
MCH: 30.1 pg (ref 26.0–34.0)
MCHC: 32.7 g/dL (ref 30.0–36.0)
MCV: 91.9 fL (ref 80.0–100.0)
Monocytes Absolute: 1 10*3/uL (ref 0.1–1.0)
Monocytes Relative: 7 %
Neutro Abs: 10.3 10*3/uL — ABNORMAL HIGH (ref 1.7–7.7)
Neutrophils Relative %: 79 %
Platelets: 299 10*3/uL (ref 150–400)
RBC: 4.69 MIL/uL (ref 4.22–5.81)
RDW: 15.6 % — ABNORMAL HIGH (ref 11.5–15.5)
WBC: 13.1 10*3/uL — ABNORMAL HIGH (ref 4.0–10.5)
nRBC: 0 % (ref 0.0–0.2)

## 2022-08-09 LAB — LIPID PANEL
Cholesterol: 139 mg/dL (ref 0–200)
HDL: 61 mg/dL (ref >40)
LDL Cholesterol: 71 mg/dL (ref 0–99)
Total CHOL/HDL Ratio: 2.3 RATIO
Triglycerides: 33 mg/dL (ref <150)
VLDL: 7 mg/dL (ref 0–40)

## 2022-08-09 LAB — TSH: TSH: 2.293 u[IU]/mL (ref 0.350–4.500)

## 2022-08-09 LAB — POCT URINE DRUG SCREEN - MANUAL ENTRY (I-SCREEN)
POC Amphetamine UR: NOT DETECTED
POC Buprenorphine (BUP): NOT DETECTED
POC Cocaine UR: NOT DETECTED
POC Marijuana UR: POSITIVE — AB
POC Methadone UR: NOT DETECTED
POC Methamphetamine UR: POSITIVE — AB
POC Morphine: NOT DETECTED
POC Oxazepam (BZO): NOT DETECTED
POC Oxycodone UR: NOT DETECTED
POC Secobarbital (BAR): NOT DETECTED

## 2022-08-09 LAB — RESP PANEL BY RT-PCR (FLU A&B, COVID) ARPGX2
Influenza A by PCR: NEGATIVE
Influenza B by PCR: NEGATIVE
SARS Coronavirus 2 by RT PCR: NEGATIVE

## 2022-08-09 LAB — HEMOGLOBIN A1C
Hgb A1c MFr Bld: 5.7 % — ABNORMAL HIGH (ref 4.8–5.6)
Mean Plasma Glucose: 116.89 mg/dL

## 2022-08-09 LAB — POC SARS CORONAVIRUS 2 AG: SARSCOV2ONAVIRUS 2 AG: NEGATIVE

## 2022-08-09 LAB — ETHANOL: Alcohol, Ethyl (B): <10 mg/dL (ref <10)

## 2022-08-09 MED ORDER — BACITRACIN-NEOMYCIN-POLYMYXIN OINTMENT TUBE: TOPICAL_OINTMENT | CUTANEOUS | Status: DC | PRN

## 2022-08-09 MED ORDER — MAGNESIUM HYDROXIDE 400 MG/5ML PO SUSP: 30.0000 mL | Freq: Every day | ORAL | Status: DC | PRN

## 2022-08-09 MED ORDER — LORAZEPAM 1 MG PO TABS: 1.0000 mg | ORAL_TABLET | Freq: Two times a day (BID) | ORAL | Status: DC

## 2022-08-09 MED ORDER — QUETIAPINE FUMARATE 50 MG PO TABS
50.0000 mg | ORAL_TABLET | Freq: Two times a day (BID) | ORAL | Status: DC
  Administered 2022-08-09 – 2022-08-10 (×2): 50 mg via ORAL
  Filled 2022-08-09 (×2): qty 1

## 2022-08-09 MED ORDER — ADULT MULTIVITAMIN W/MINERALS CH: 1.0000 | ORAL_TABLET | Freq: Every day | ORAL | Status: DC

## 2022-08-09 MED ORDER — ACETAMINOPHEN 325 MG PO TABS
650.0000 mg | ORAL_TABLET | Freq: Four times a day (QID) | ORAL | Status: DC | PRN
  Administered 2022-08-09: 650 mg via ORAL
  Filled 2022-08-09: qty 2

## 2022-08-09 MED ORDER — LORAZEPAM 1 MG PO TABS: 1.0000 mg | ORAL_TABLET | Freq: Four times a day (QID) | ORAL | Status: DC | PRN

## 2022-08-09 MED ORDER — THIAMINE MONONITRATE 100 MG PO TABS: 100.0000 mg | ORAL_TABLET | Freq: Every day | ORAL | Status: DC

## 2022-08-09 MED ORDER — LORAZEPAM 1 MG PO TABS: 1.0000 mg | ORAL_TABLET | Freq: Every day | ORAL | Status: DC

## 2022-08-09 MED ORDER — GABAPENTIN 600 MG PO TABS
300.0000 mg | ORAL_TABLET | Freq: Two times a day (BID) | ORAL | Status: DC
  Administered 2022-08-09 – 2022-08-11 (×4): 300 mg via ORAL
  Filled 2022-08-09 (×4): qty 1

## 2022-08-09 MED ORDER — LORAZEPAM 1 MG PO TABS: 1.0000 mg | ORAL_TABLET | Freq: Three times a day (TID) | ORAL | Status: DC

## 2022-08-09 MED ORDER — METHOCARBAMOL 500 MG PO TABS
500.0000 mg | ORAL_TABLET | Freq: Three times a day (TID) | ORAL | Status: DC | PRN
  Administered 2022-08-09 – 2022-08-12 (×5): 500 mg via ORAL
  Filled 2022-08-09 (×5): qty 1

## 2022-08-09 MED ORDER — LOPERAMIDE HCL 2 MG PO CAPS: 2.0000 mg | ORAL_CAPSULE | ORAL | Status: DC | PRN

## 2022-08-09 MED ORDER — LORAZEPAM 1 MG PO TABS: 1.0000 mg | ORAL_TABLET | Freq: Four times a day (QID) | ORAL | Status: DC

## 2022-08-09 MED ORDER — ALUM & MAG HYDROXIDE-SIMETH 200-200-20 MG/5ML PO SUSP: 30.0000 mL | ORAL | Status: DC | PRN

## 2022-08-09 MED ORDER — ONDANSETRON 4 MG PO TBDP: 4.0000 mg | ORAL_TABLET | Freq: Four times a day (QID) | ORAL | Status: DC | PRN

## 2022-08-09 MED ORDER — HYDROXYZINE HCL 25 MG PO TABS: 25.0000 mg | ORAL_TABLET | Freq: Four times a day (QID) | ORAL | Status: DC | PRN

## 2022-08-09 MED ORDER — THIAMINE HCL 100 MG/ML IJ SOLN: 100.0000 mg | Freq: Once | INTRAMUSCULAR | Status: DC

## 2022-08-09 MED ORDER — DICYCLOMINE HCL 20 MG PO TABS: 20.0000 mg | ORAL_TABLET | Freq: Four times a day (QID) | ORAL | Status: DC | PRN

## 2022-08-09 MED ORDER — ATORVASTATIN CALCIUM 10 MG PO TABS
20.0000 mg | ORAL_TABLET | Freq: Every day | ORAL | Status: DC
  Administered 2022-08-09 – 2022-08-11 (×3): 20 mg via ORAL
  Filled 2022-08-09 (×3): qty 2

## 2022-08-09 MED ORDER — BUPROPION HCL ER (XL) 150 MG PO TB24
150.0000 mg | ORAL_TABLET | Freq: Every day | ORAL | Status: DC
  Administered 2022-08-09 – 2022-08-13 (×5): 150 mg via ORAL
  Filled 2022-08-09 (×5): qty 1

## 2022-08-09 MED ORDER — HYDROXYZINE HCL 25 MG PO TABS
25.0000 mg | ORAL_TABLET | Freq: Four times a day (QID) | ORAL | Status: DC | PRN
  Administered 2022-08-09 – 2022-08-12 (×5): 25 mg via ORAL
  Filled 2022-08-09 (×6): qty 1

## 2022-08-09 NOTE — ED Notes (Signed)
Pt is awake and alert. Sitting up in bed.  He has flat affect but good eye contact. Given toiletries and offered breakfast.
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
## 2022-08-09 NOTE — Progress Notes (Signed)
Patient remains awake and alert however he appears to be tired however is resisting sleep.  He can ramble verbally and remains fidgety.  He has been ruminating about his relapse and past life events.  He is negativistic despite RN's attempt to help him reframe his thoughts.  Patient feels he is still "coming down" from the durgs he was using.  He states he was using "everything."  He appears demoralized and has been making random comments about past events and life trauma without specific content.  He is pleasant however and cooperative with care.  He denies avh shi or plan and will seek staff if overwhelmed.  RN continues to encourage him to attempt rest.  Will monitor and provide safe environment.
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
## 2022-08-09 NOTE — Progress Notes (Signed)
Pt admitted to Continuous Observation due to substance abuse and passive SI. Pt verbally contracts for safety on the unit. Pt is alert and oriented X4. Pt is ambulatory and is oriented to staff/unit. Pt complained of headache. PRN Tylenol administered with no issue. Pt denies current HI/AVH. Staff will monitor for pt's safety.

## 2022-08-09 NOTE — ED Notes (Signed)
Pt asleep in bed. Respirations even and unlabored. Monitoring for safety. 

## 2022-08-09 NOTE — Care Management (Signed)
Care Management   Per Ludger Nutting, NP - patient meets criteria for substance abuse inpatient treatment.  Writer referred patient to the [facility].  The patient is out of network and not able to go to the facility.   Patient was provided with substance abuse resources.   Writer informed the NP.

## 2022-08-09 NOTE — ED Notes (Signed)
Pt is in [place] group meeting

## 2022-08-09 NOTE — Progress Notes (Signed)
Pt is in bed awake. No signs of acute distress noted or complaints voiced. Staff will monitor for pt's safety.

## 2022-08-09 NOTE — ED Provider Notes (Cosign Needed Addendum)
Facility Based Crisis Admission H&P  Date: 08/09/22 Patient Name: Brian [name] MRN: [id number] Chief Complaint:      Diagnoses:  Final diagnoses:  Polysubstance dependence including opioid type drug, episodic abuse [facility])  Housing instability after recent homelessness    HPI: Brian [name] 36 year old Caucasian male presents with a history of bipolar disorder, substance-induced mood disorder and generalized anxiety disorder.  Reports he is seeking residential treatment facility.  UDS positive for cocaine, amphetamines and marijuana.  Patient was recently seen and evaluated and recommended to follow-up with additional outpatient resources.  Denied that he is followed up.  However,  is requesting residential treatment for substance abuse.  NP attempted to follow-up with [facility] treatment center however patient was not in network.   Brian [name] reports " I am tired of going to the same residential treatment facilities seeing the same people."  Patient reported he needs a new environment so he can stop using. States he is hopeful to get into a residential treatment facility out of the state. Currently denying suicidal or homicidal ideations.    During evaluation Brian [name] is sitting in no acute distress. he is alert/oriented x 4; calm/cooperative; and mood congruent with affect. He is speaking in a clear tone at moderate volume, and normal pace; with good eye contact. His thought process is coherent and relevant; There is no indication that he is currently responding to internal/external stimuli or experiencing delusional thought content; and he has denied suicidal/self-harm/homicidal ideation, psychosis, and paranoia.   Patient has remained calm throughout assessment and has answered questions appropriately.     Per initial assessment note: "Patient's most recent ED visit was on 08/03/2022 under IVC for manic behavior due to substance abuse.  Patient was offered residential treatment  option for substance abuse, but declined, stating it was too long of a program.  Patient was then provided with outpatient psychiatric resources in addition to a 7-day supply of his Wellbutrin and gabapentin.  Patient was encouraged to follow-up with the [facility] outpatient psychiatric office.   Patient reported that prior to presenting to the ED on 08/13/2022, he was seen 2 weeks earlier by [facility] [place] [facility].  Patient reports his provider was able to get him a bed at [facility] for substance abuse treatment.  Patient reports he left that program AMA because "they didn't want to give me the medications I asked for."     PHQ 2-9:  [place] ED from 07/06/2022 in [facility] ED from 04/11/2022 in [facility] ED from 03/30/2022 in [facility]  Thoughts that you would be better off dead, or of hurting yourself in some way Not at all Not at all Not at all  PHQ-9 Total Score _0 Flowsheet Row ED from 08/01/2022 in [facility] [place] ED from 07/06/2022 in [facility] ED from 07/04/2022 in [place] DEPT  C-SSRS RISK CATEGORY No Risk No Risk No Risk        Total Time spent with patient: 15 minutes  Musculoskeletal  Strength & Muscle Tone: within normal limits Gait & Station: normal Patient leans: N/A  Psychiatric Specialty Exam  Presentation General Appearance:  Casual  Eye Contact: Good  Speech: Clear and Coherent  Speech Volume: Normal  Handedness: Right   Mood and Affect  Mood: Anxious  Affect: Congruent  Thought Process  Thought Processes: Coherent  Descriptions of Associations:Intact  Orientation:Full (Time, Place and Person)  Thought Content:WDL  Diagnosis of Schizophrenia or Schizoaffective disorder in past: No    Hallucinations:Hallucinations: None  Ideas of Reference:None  Suicidal Thoughts:Suicidal Thoughts: Yes, Passive SI Passive Intent and/or Plan: With Intent  Homicidal Thoughts:Homicidal Thoughts: No   Sensorium  Memory: Immediate Fair  Judgment: Poor  Insight: Shallow   Executive Functions  Concentration: Fair  Attention Span: Fair  Recall: [employer] of Knowledge: Fair  Language: Fair   Psychomotor Activity  Psychomotor Activity: Psychomotor Activity: Normal   Assets  Assets: Communication Skills; Desire for Improvement   Sleep  Sleep: Sleep: Poor   Nutritional Assessment (For OBS and FBC admissions only) Has the patient had a weight loss or gain of 10 pounds or more in the last 3 months?: No Has the patient had a decrease in food intake/or appetite?: No Does the patient have dental problems?: No Does the patient have eating habits or behaviors that may be indicators of an eating disorder including binging or inducing vomiting?: No Has the patient recently lost weight without trying?: 0 Has the patient been eating poorly because of a decreased appetite?: 0 Malnutrition Screening Tool Score: 0    Physical Exam Vitals and nursing note reviewed.  HENT:     Head: Normocephalic.  Cardiovascular:     Rate and Rhythm: Normal rate and regular rhythm.  Neurological:     Mental Status: He is oriented to person, place, and time.  Psychiatric:        Mood and Affect: Mood normal.        Behavior: Behavior normal.    Review of Systems  Cardiovascular: Negative.   Gastrointestinal: Negative.   Genitourinary: Negative.   Psychiatric/Behavioral:  Positive for depression and substance abuse. Suicidal ideas: passive ideation.The patient is nervous/anxious.   All other systems reviewed and are negative.   Blood pressure (!) 131/96, pulse 92, temperature 98.3 F (36.8 C), temperature source Oral, resp. rate 18, SpO2 98 %. There is no height or  weight on file to calculate BMI.  Past Psychiatric History:   Is the patient at risk to self? No  Has the patient been a risk to self in the past 6 months? No .    Has the patient been a risk to self within the distant past? No   Is the patient a risk to others? No   Has the patient been a risk to others in the past 6 months? No   Has the patient been a risk to others within the distant past? 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 Collene Mares about sedation type/timinng - I recommend MAC   HAND SURGERY  2006   LEFT HEART CATH AND CORONARY ANGIOGRAPHY N/A 06/21/2020   Procedure:  LEFT HEART CATH AND CORONARY ANGIOGRAPHY;  Surgeon: Nelva Bush, MD;  Location: [facility];  Service: Cardiovascular;  Laterality: N/A;    Family History:  Family History  Problem Relation Age of Onset   Heart disease Father     Social History:  Social History   Socioeconomic History   Marital status: Single    Spouse name: Not on file   Number of children: Not on file   Years of education: Not on file   Highest education level: Not on file  Occupational History   Not on file  Tobacco Use   Smoking status: Every Day    Packs/day: 0.50    Types: Cigarettes   Smokeless tobacco: Never  Vaping Use   Vaping Use: Never used  Substance and Sexual Activity   Alcohol use: Not Currently   Drug use: Yes    Types: IV,  Methamphetamines   Sexual activity: Not Currently  Other Topics Concern   Not on file  Social History Narrative   ** Merged History Encounter **       Lives with a friend   Lost house and custody of son 9broke up with fiancee)   Nature conservation officer   Social Determinants of Health   Financial Resource Strain: Not on file  Food Insecurity: Not on file  Transportation Needs: Not on file  Physical Activity: Not on file  Stress: Not on file  Social Connections: Not on file  Intimate Partner Violence: Not on file    SDOH:  [place]: High Risk (08/09/2020)  Alcohol Screen: Low Risk  (12/21/2020)  Depression (PHQ2-9): Medium Risk (07/06/2022)  Tobacco Use: High Risk (08/01/2022)    Last Labs:  Admission on 08/09/2022  Component Date Value Ref Range Status   SARS Coronavirus 2 by RT PCR 08/09/2022 NEGATIVE  NEGATIVE Final   Comment: (NOTE) SARS-CoV-2 target nucleic acids are NOT DETECTED.  The SARS-CoV-2 RNA is generally detectable in upper respiratory specimens during the acute phase of infection. The lowest concentration of SARS-CoV-2 viral copies this assay can detect is 138 copies/mL. A negative result does not preclude SARS-Cov-2 infection and should not be used as the sole basis for treatment or other patient management decisions. A negative result may occur with  improper specimen collection/handling, submission of specimen other than nasopharyngeal swab, presence of viral mutation(s) within the areas targeted by this assay, and inadequate number of viral copies(<138 copies/mL). A negative result must be combined with clinical observations, patient history, and epidemiological information. The expected result is Negative.  Fact Sheet for Patients:  EntrepreneurPulse.com.au  Fact Sheet for Healthcare Providers:  IncredibleEmployment.be  This test is no                          t yet approved or cleared by the  [place] FDA and  has been authorized for detection and/or diagnosis of SARS-CoV-2 by FDA under an Emergency Use Authorization (EUA). This EUA will remain  in effect (meaning this test can be used) for the duration of the COVID-19 declaration under Section 564(b)(1) of the Act, 21 U.S.C.section 360bbb-3(b)(1), unless the authorization is terminated  or revoked sooner.       Influenza A by PCR 08/09/2022 NEGATIVE  NEGATIVE Final   Influenza B by PCR 08/09/2022 NEGATIVE  NEGATIVE Final   Comment: (NOTE) The Xpert Xpress SARS-CoV-2/FLU/RSV plus assay is intended as an aid in the diagnosis of influenza from Nasopharyngeal  swab specimens and should not be used as a sole basis for treatment. Nasal washings and aspirates are unacceptable for Xpert Xpress SARS-CoV-2/FLU/RSV testing.  Fact Sheet for Patients: EntrepreneurPulse.com.au  Fact Sheet for Healthcare Providers: IncredibleEmployment.be  This test is not yet approved or cleared by the [place] FDA and has been authorized for detection and/or diagnosis of SARS-CoV-2 by FDA under an Emergency Use Authorization (EUA). This EUA will remain in effect (meaning this test can be used) for the duration of the COVID-19 declaration under Section 564(b)(1) of the Act, 21 U.S.C. section 360bbb-3(b)(1), unless the authorization is terminated or revoked.  Performed at [facility], [place] [address], [place], [place] [address]    WBC 08/09/2022 13.1 (H)  4.0 - 10.5 K/uL Final   RBC 08/09/2022 4.69  4.22 - 5.81 MIL/uL Final   Hemoglobin 08/09/2022 14.1  13.0 - 17.0 g/dL Final   HCT 08/09/2022 43.1  39.0 - 52.0 % Final   MCV 08/09/2022 91.9  80.0 - 100.0 fL Final   MCH 08/09/2022 30.1  26.0 - 34.0 pg Final   MCHC 08/09/2022 32.7  30.0 - 36.0 g/dL Final   RDW 08/09/2022 15.6 (H)  11.5 - 15.5 % Final   Platelets 08/09/2022 299  150 - 400 K/uL Final   nRBC 08/09/2022 0.0  0.0 - 0.2 % Final    Neutrophils Relative % 08/09/2022 79  % Final   Neutro Abs 08/09/2022 10.3 (H)  1.7 - 7.7 K/uL Final   Lymphocytes Relative 08/09/2022 12  % Final   Lymphs Abs 08/09/2022 1.6  0.7 - 4.0 K/uL Final   Monocytes Relative 08/09/2022 7  % Final   Monocytes Absolute 08/09/2022 1.0  0.1 - 1.0 K/uL Final   Eosinophils Relative 08/09/2022 1  % Final   Eosinophils Absolute 08/09/2022 0.1  0.0 - 0.5 K/uL Final   Basophils Relative 08/09/2022 0  % Final   Basophils Absolute 08/09/2022 0.0  0.0 - 0.1 K/uL Final   Immature Granulocytes 08/09/2022 1  % Final   Abs Immature Granulocytes 08/09/2022 0.07  0.00 - 0.07 K/uL Final   Performed at [facility], [place] [address], [place], [place] [address]   Sodium 08/09/2022 136  135 - 145 mmol/L Final   Potassium 08/09/2022 4.1  3.5 - 5.1 mmol/L Final   Chloride 08/09/2022 102  98 - 111 mmol/L Final   CO2 08/09/2022 26  22 - 32 mmol/L Final   Glucose, Bld 08/09/2022 101 (H)  70 - 99 mg/dL Final   Glucose reference range applies only to samples taken after fasting for at least 8 hours.   BUN 08/09/2022 11  6 - 20 mg/dL Final   Creatinine, Ser 08/09/2022 1.26 (H)  0.61 - 1.24 mg/dL Final   Calcium 08/09/2022 9.5  8.9 - 10.3 mg/dL Final   Total Protein 08/09/2022 7.5  6.5 - 8.1 g/dL Final   Albumin 08/09/2022 4.0  3.5 - 5.0 g/dL Final   AST 08/09/2022 23  15 - 41 U/L Final   ALT 08/09/2022 23  0 - 44 U/L Final   Alkaline Phosphatase 08/09/2022 65  38 - 126 U/L Final   Total Bilirubin 08/09/2022 0.4  0.3 - 1.2 mg/dL Final   GFR, Estimated 08/09/2022 >60  >60 mL/min Final   Comment: (NOTE) Calculated using the CKD-EPI Creatinine Equation (2021)    Anion gap 08/09/2022 8  5 - 15 Final   Performed at [facility] [address]  [place], [place] [address]   Hgb A1c MFr Bld 08/09/2022 5.7 (H)  4.8 - 5.6 % Final   Comment: (NOTE) Pre diabetes:          5.7%-6.4%  Diabetes:              >6.4%  Glycemic control for   <7.0% adults with diabetes     Mean Plasma Glucose 08/09/2022 116.89  mg/dL Final   Performed at [facility], [place] [address], [place], [place] [address]   Alcohol, Ethyl (B) 08/09/2022 <10  <10 mg/dL Final   Comment: (NOTE) Lowest detectable limit for serum alcohol is 10 mg/dL.  For medical purposes only. Performed at [facility], [place] [address] [place], [place] [address]    POC Amphetamine UR 08/09/2022 None Detected  NONE DETECTED (Cut Off Level 1000 ng/mL) Preliminary   POC Secobarbital (BAR) 08/09/2022 None Detected  NONE DETECTED (Cut Off Level 300 ng/mL) Preliminary   POC Buprenorphine (BUP) 08/09/2022 None Detected  NONE DETECTED (Cut Off Level 10 ng/mL) Preliminary   POC Oxazepam (BZO) 08/09/2022 None Detected  NONE DETECTED (Cut Off Level 300 ng/mL) Preliminary   POC Cocaine UR 08/09/2022 None Detected  NONE DETECTED (Cut Off Level 300 ng/mL) Preliminary   POC Methamphetamine UR 08/09/2022 Positive (A)  NONE DETECTED (Cut Off Level 1000 ng/mL) Preliminary   POC Morphine 08/09/2022 None Detected  NONE DETECTED (Cut Off Level 300 ng/mL) Preliminary   POC Methadone UR 08/09/2022 None Detected  NONE DETECTED (Cut Off Level 300 ng/mL) Preliminary   POC Oxycodone UR 08/09/2022 None Detected  NONE DETECTED (Cut Off Level 100 ng/mL) Preliminary   POC Marijuana UR 08/09/2022 Positive (A)  NONE DETECTED (Cut Off Level 50 ng/mL) Preliminary   SARSCOV2ONAVIRUS 2 AG 08/09/2022 NEGATIVE  NEGATIVE Final   Comment: (NOTE) SARS-CoV-2 antigen NOT DETECTED.   Negative results are presumptive.  Negative results do not preclude SARS-CoV-2 infection and should not be used as the sole basis for treatment or other patient management decisions, including infection  control decisions, particularly in the presence of clinical signs and  symptoms consistent with COVID-19, or in those who have been in contact with the virus.  Negative results must be combined with clinical observations, patient history, and  epidemiological information. The expected result is Negative.  Fact Sheet for Patients: HandmadeRecipes.com.cy  Fact Sheet for Healthcare Providers: FuneralLife.at  This test is not yet approved or cleared by the [place] FDA and  has been authorized for detection and/or diagnosis of SARS-CoV-2 by FDA under an Emergency Use Authorization (EUA).  This EUA will remain in effect (meaning this test can be used) for the duration of  the COV                          ID-19 declaration under Section 564(b)(1) of the Act, 21 U.S.C. section 360bbb-3(b)(1), unless the authorization is terminated or revoked sooner.     Cholesterol 08/09/2022 139  0 - 200 mg/dL Final   Triglycerides 08/09/2022 33  <150 mg/dL Final   HDL 08/09/2022 61  >40 mg/dL Final   Total CHOL/HDL Ratio 08/09/2022 2.3  RATIO Final   VLDL 08/09/2022 7  0 - 40 mg/dL Final   LDL Cholesterol 08/09/2022 71  0 - 99 mg/dL Final   Comment:        Total Cholesterol/HDL:CHD Risk Coronary Heart Disease Risk Table  Men   Women  1/2 Average Risk   3.4   3.3  Average Risk       5.0   4.4  2 X Average Risk   9.6   7.1  3 X Average Risk  23.4   11.0        Use the calculated Patient Ratio above and the CHD Risk Table to determine the patient's CHD Risk.        ATP III CLASSIFICATION (LDL):  <100     mg/dL   Optimal  100-129  mg/dL   Near or Above                    Optimal  130-159  mg/dL   Borderline  160-189  mg/dL   High  >190     mg/dL   Very High Performed at [facility] [address], [place], [place] [address]    TSH 08/09/2022 2.293  0.350 - 4.500 uIU/mL Final   Comment: Performed by a 3rd Generation assay with a functional sensitivity of <=0.01 uIU/mL. Performed at [facility], [place] [address] [place], [place] [address]   Admission on 08/01/2022, Discharged on 08/03/2022  Component Date Value Ref Range Status   Sodium 08/01/2022  142  135 - 145 mmol/L Final   Potassium 08/01/2022 3.8  3.5 - 5.1 mmol/L Final   Chloride 08/01/2022 111  98 - 111 mmol/L Final   CO2 08/01/2022 22  22 - 32 mmol/L Final   Glucose, Bld 08/01/2022 94  70 - 99 mg/dL Final   Glucose reference range applies only to samples taken after fasting for at least 8 hours.   BUN 08/01/2022 12  6 - 20 mg/dL Final   Creatinine, Ser 08/01/2022 1.50 (H)  0.61 - 1.24 mg/dL Final   Calcium 08/01/2022 8.9  8.9 - 10.3 mg/dL Final   Total Protein 08/01/2022 6.9  6.5 - 8.1 g/dL Final   Albumin 08/01/2022 3.8  3.5 - 5.0 g/dL Final   AST 08/01/2022 38  15 - 41 U/L Final   ALT 08/01/2022 29  0 - 44 U/L Final   Alkaline Phosphatase 08/01/2022 64  38 - 126 U/L Final   Total Bilirubin 08/01/2022 0.7  0.3 - 1.2 mg/dL Final   GFR, Estimated 08/01/2022 >60  >60 mL/min Final   Comment: (NOTE) Calculated using the CKD-EPI Creatinine Equation (2021)    Anion gap 08/01/2022 9  5 - 15 Final   Performed at [facility] [address], [place], [place] [address]   Alcohol, Ethyl (B) 08/01/2022 <10  <10 mg/dL Final   Comment: (NOTE) Lowest detectable limit for serum alcohol is 10 mg/dL.  For medical purposes only. Performed at [facility], [place] [address] [place],  [address]    Opiates 08/01/2022 NONE DETECTED  NONE DETECTED Final   Cocaine 08/01/2022 POSITIVE (A)  NONE DETECTED Final   Benzodiazepines 08/01/2022 NONE DETECTED  NONE DETECTED Final   Amphetamines 08/01/2022 POSITIVE (A)  NONE DETECTED Final   Tetrahydrocannabinol 08/01/2022 POSITIVE (A)  NONE DETECTED Final   Barbiturates 08/01/2022 NONE DETECTED  NONE DETECTED Final   Comment: (NOTE) DRUG SCREEN FOR MEDICAL PURPOSES ONLY.  IF CONFIRMATION IS NEEDED FOR ANY PURPOSE, NOTIFY LAB WITHIN 5 DAYS.  LOWEST DETECTABLE LIMITS FOR URINE DRUG SCREEN Drug Class                     Cutoff (ng/mL)  Amphetamine and metabolites    1000 Barbiturate and metabolites    200 Benzodiazepine                  433 Tricyclics and metabolites     300 Opiates and metabolites        300 Cocaine and metabolites        300 THC                            50 Performed at [facility], [place] [address], [place], [place] [address]    WBC 08/01/2022 11.3 (H)  4.0 - 10.5 K/uL Final   RBC 08/01/2022 4.16 (L)  4.22 - 5.81 MIL/uL Final   Hemoglobin 08/01/2022 12.5 (L)  13.0 - 17.0 g/dL Final   HCT 08/01/2022 38.4 (L)  39.0 - 52.0 % Final   MCV 08/01/2022 92.3  80.0 - 100.0 fL Final   MCH 08/01/2022 30.0  26.0 - 34.0 pg Final   MCHC 08/01/2022 32.6  30.0 - 36.0 g/dL Final   RDW 08/01/2022 15.2  11.5 - 15.5 % Final   Platelets 08/01/2022 247  150 - 400 K/uL Final   nRBC 08/01/2022 0.0  0.0 - 0.2 % Final   Neutrophils Relative % 08/01/2022 78  % Final   Neutro Abs 08/01/2022 8.7 (H)  1.7 - 7.7 K/uL Final   Lymphocytes Relative 08/01/2022 10  % Final   Lymphs Abs 08/01/2022 1.2  0.7 - 4.0 K/uL Final   Monocytes Relative 08/01/2022 10  % Final   Monocytes Absolute 08/01/2022 1.1 (H)  0.1 - 1.0 K/uL Final   Eosinophils Relative 08/01/2022 2  % Final   Eosinophils Absolute 08/01/2022 0.2  0.0 - 0.5 K/uL Final   Basophils Relative 08/01/2022 0  % Final   Basophils Absolute 08/01/2022 0.1  0.0 - 0.1 K/uL Final   Immature Granulocytes 08/01/2022 0  % Final   Abs Immature Granulocytes 08/01/2022 0.03  0.00 - 0.07 K/uL Final   Performed at [facility], [place] [address], [place], [place] [address]  Admission on 07/06/2022, Discharged on 07/07/2022  Component Date Value Ref Range Status   SARS Coronavirus 2 by RT PCR 07/06/2022 NEGATIVE  NEGATIVE Final   Comment: (NOTE) SARS-CoV-2 target nucleic acids are NOT DETECTED.  The SARS-CoV-2 RNA is generally detectable in upper respiratory specimens during the acute phase of infection. The lowest concentration of SARS-CoV-2 viral copies this assay can detect is 138 copies/mL. A negative result does not preclude SARS-Cov-2 infection and should not  be used as the sole basis for treatment or other patient management decisions. A negative result may occur with  improper specimen collection/handling, submission of specimen other than nasopharyngeal swab, presence of viral mutation(s) within the areas targeted by this assay, and inadequate number of viral copies(<138 copies/mL). A negative result must be combined with clinical observations, patient history, and epidemiological information. The expected result is Negative.  Fact Sheet for Patients:  EntrepreneurPulse.com.au  Fact Sheet for Healthcare Providers:  IncredibleEmployment.be  This test is no                          t yet approved or cleared by the [place] FDA and  has been authorized for detection and/or diagnosis of SARS-CoV-2 by FDA under an Emergency Use Authorization (EUA). This EUA will remain  in effect (meaning this test can be used)  for the duration of the COVID-19 declaration under Section 564(b)(1) of the Act, 21 U.S.C.section 360bbb-3(b)(1), unless the authorization is terminated  or revoked sooner.       Influenza A by PCR 07/06/2022 NEGATIVE  NEGATIVE Final   Influenza B by PCR 07/06/2022 NEGATIVE  NEGATIVE Final   Comment: (NOTE) The Xpert Xpress SARS-CoV-2/FLU/RSV plus assay is intended as an aid in the diagnosis of influenza from Nasopharyngeal swab specimens and should not be used as a sole basis for treatment. Nasal washings and aspirates are unacceptable for Xpert Xpress SARS-CoV-2/FLU/RSV testing.  Fact Sheet for Patients: EntrepreneurPulse.com.au  Fact Sheet for Healthcare Providers: IncredibleEmployment.be  This test is not yet approved or cleared by the [place] FDA and has been authorized for detection and/or diagnosis of SARS-CoV-2 by FDA under an Emergency Use Authorization (EUA). This EUA will remain in effect (meaning this test can be used) for the  duration of the COVID-19 declaration under Section 564(b)(1) of the Act, 21 U.S.C. section 360bbb-3(b)(1), unless the authorization is terminated or revoked.  Performed at [facility], [place] [address], [place], [place] [address]    WBC 07/06/2022 10.2  4.0 - 10.5 K/uL Final   RBC 07/06/2022 4.50  4.22 - 5.81 MIL/uL Final   Hemoglobin 07/06/2022 13.4  13.0 - 17.0 g/dL Final   HCT 07/06/2022 39.3  39.0 - 52.0 % Final   MCV 07/06/2022 87.3  80.0 - 100.0 fL Final   MCH 07/06/2022 29.8  26.0 - 34.0 pg Final   MCHC 07/06/2022 34.1  30.0 - 36.0 g/dL Final   RDW 07/06/2022 13.2  11.5 - 15.5 % Final   Platelets 07/06/2022 273  150 - 400 K/uL Final   nRBC 07/06/2022 0.0  0.0 - 0.2 % Final   Neutrophils Relative % 07/06/2022 68  % Final   Neutro Abs 07/06/2022 6.9  1.7 - 7.7 K/uL Final   Lymphocytes Relative 07/06/2022 20  % Final   Lymphs Abs 07/06/2022 2.0  0.7 - 4.0 K/uL Final   Monocytes Relative 07/06/2022 10  % Final   Monocytes Absolute 07/06/2022 1.0  0.1 - 1.0 K/uL Final   Eosinophils Relative 07/06/2022 1  % Final   Eosinophils Absolute 07/06/2022 0.1  0.0 - 0.5 K/uL Final   Basophils Relative 07/06/2022 1  % Final   Basophils Absolute 07/06/2022 0.1  0.0 - 0.1 K/uL Final   Immature Granulocytes 07/06/2022 0  % Final   Abs Immature Granulocytes 07/06/2022 0.03  0.00 - 0.07 K/uL Final   Performed at [facility], [place] [address] [place], [place] [address]   Sodium 07/06/2022 137  135 - 145 mmol/L Final   Potassium 07/06/2022 4.0  3.5 - 5.1 mmol/L Final   Chloride 07/06/2022 102  98 - 111 mmol/L Final   CO2 07/06/2022 25  22 - 32 mmol/L Final   Glucose, Bld 07/06/2022 83  70 - 99 mg/dL Final   Glucose reference range applies only to samples taken after fasting for at least 8 hours.   BUN 07/06/2022 16  6 - 20 mg/dL Final   Creatinine, Ser 07/06/2022 1.18  0.61 - 1.24 mg/dL Final   Calcium 07/06/2022 9.6  8.9 - 10.3 mg/dL Final   Total Protein 07/06/2022 7.2  6.5 - 8.1  g/dL Final   Albumin 07/06/2022 3.9  3.5 - 5.0 g/dL Final   AST 07/06/2022 72 (H)  15 - 41 U/L Final   ALT 07/06/2022 60 (H)  0 - 44  U/L Final   Alkaline Phosphatase 07/06/2022 82  38 - 126 U/L Final   Total Bilirubin 07/06/2022 0.6  0.3 - 1.2 mg/dL Final   GFR, Estimated 07/06/2022 >60  >60 mL/min Final   Comment: (NOTE) Calculated using the CKD-EPI Creatinine Equation (2021)    Anion gap 07/06/2022 10  5 - 15 Final   Performed at [facility] [address] [place], [place] [address]   Alcohol, Ethyl (B) 07/06/2022 <10  <10 mg/dL Final   Comment: (NOTE) Lowest detectable limit for serum alcohol is 10 mg/dL.  For medical purposes only. Performed at [facility], [place] [address] [place], [place] [address]    POC Amphetamine UR 07/06/2022 Positive (PE)  NONE DETECTED (Cut Off Level 1000 ng/mL) Final   POC Secobarbital (BAR) 07/06/2022 None Detected  NONE DETECTED (Cut Off Level 300 ng/mL) Final   POC Buprenorphine (BUP) 07/06/2022 Positive (PE)  NONE DETECTED (Cut Off Level 10 ng/mL) Final   POC Oxazepam (BZO) 07/06/2022 None Detected  NONE DETECTED (Cut Off Level 300 ng/mL) Final   POC Cocaine UR 07/06/2022 None Detected  NONE DETECTED (Cut Off Level 300 ng/mL) Final   POC Methamphetamine UR 07/06/2022 Positive (PE)  NONE DETECTED (Cut Off Level 1000 ng/mL) Final   POC Morphine 07/06/2022 None Detected  NONE DETECTED (Cut Off Level 300 ng/mL) Final   POC Methadone UR 07/06/2022 None Detected  NONE DETECTED (Cut Off Level 300 ng/mL) Final   POC Oxycodone UR 07/06/2022 None Detected  NONE DETECTED (Cut Off Level 100 ng/mL) Final   POC Marijuana UR 07/06/2022 None Detected  NONE DETECTED (Cut Off Level 50 ng/mL) Final   SARSCOV2ONAVIRUS 2 AG 07/06/2022 NEGATIVE  NEGATIVE Final   Comment: (NOTE) SARS-CoV-2 antigen NOT DETECTED.   Negative results are presumptive.  Negative results do not preclude SARS-CoV-2 infection and should not be used as the sole basis for treatment  or other patient management decisions, including infection  control decisions, particularly in the presence of clinical signs and  symptoms consistent with COVID-19, or in those who have been in contact with the virus.  Negative results must be combined with clinical observations, patient history, and epidemiological information. The expected result is Negative.  Fact Sheet for Patients: HandmadeRecipes.com.cy  Fact Sheet for Healthcare Providers: FuneralLife.at  This test is not yet approved or cleared by the [place] FDA and  has been authorized for detection and/or diagnosis of SARS-CoV-2 by FDA under an Emergency Use Authorization (EUA).  This EUA will remain in effect (meaning this test can be used) for the duration of  the COV                          ID-19 declaration under Section 564(b)(1) of the Act, 21 U.S.C. section 360bbb-3(b)(1), unless the authorization is terminated or revoked sooner.     Cholesterol 07/06/2022 137  0 - 200 mg/dL Final   Triglycerides 07/06/2022 56  <150 mg/dL Final   HDL 07/06/2022 45  >40 mg/dL Final   Total CHOL/HDL Ratio 07/06/2022 3.0  RATIO Final   VLDL 07/06/2022 11  0 - 40 mg/dL Final   LDL Cholesterol 07/06/2022 81  0 - 99 mg/dL Final   Comment:        Total Cholesterol/HDL:CHD Risk Coronary Heart Disease Risk Table                     Men   Women  1/2 Average Risk   3.4   3.3  Average Risk       5.0   4.4  2 X Average Risk   9.6   7.1  3 X Average Risk  23.4   11.0        Use the calculated Patient Ratio above and the CHD Risk Table to determine the patient's CHD Risk.        ATP III CLASSIFICATION (LDL):  <100     mg/dL   Optimal  100-129  mg/dL   Near or Above                    Optimal  130-159  mg/dL   Borderline  160-189  mg/dL   High  >190     mg/dL   Very High Performed at [facility] [address], [place], [place] [address]    TSH 07/06/2022 1.168  0.350 -  4.500 uIU/mL Final   Comment: Performed by a 3rd Generation assay with a functional sensitivity of <=0.01 uIU/mL. Performed at [facility], [place] [address], [place], [place] [address]   Admission on 07/03/2022, Discharged on 07/03/2022  Component Date Value Ref Range Status   SARS Coronavirus 2 by RT PCR 07/03/2022 NEGATIVE  NEGATIVE Final   Comment: (NOTE) SARS-CoV-2 target nucleic acids are NOT DETECTED.  The SARS-CoV-2 RNA is generally detectable in upper respiratory specimens during the acute phase of infection. The lowest concentration of SARS-CoV-2 viral copies this assay can detect is 138 copies/mL. A negative result does not preclude SARS-Cov-2 infection and should not be used as the sole basis for treatment or other patient management decisions. A negative result may occur with  improper specimen collection/handling, submission of specimen other than nasopharyngeal swab, presence of viral mutation(s) within the areas targeted by this assay, and inadequate number of viral copies(<138 copies/mL). A negative result must be combined with clinical observations, patient history, and epidemiological information. The expected result is Negative.  Fact Sheet for Patients:  EntrepreneurPulse.com.au  Fact Sheet for Healthcare Providers:  IncredibleEmployment.be  This test is no                          t yet approved or cleared by the [place] FDA and  has been authorized for detection and/or diagnosis of SARS-CoV-2 by FDA under an Emergency Use Authorization (EUA). This EUA will remain  in effect (meaning this test can be used) for the duration of the COVID-19 declaration under Section 564(b)(1) of the Act, 21 U.S.C.section 360bbb-3(b)(1), unless the authorization is terminated  or revoked sooner.       Influenza A by PCR 07/03/2022 NEGATIVE  NEGATIVE Final   Influenza B by PCR 07/03/2022 NEGATIVE  NEGATIVE Final   Comment:  (NOTE) The Xpert Xpress SARS-CoV-2/FLU/RSV plus assay is intended as an aid in the diagnosis of influenza from Nasopharyngeal swab specimens and should not be used as a sole basis for treatment. Nasal washings and aspirates are unacceptable for Xpert Xpress SARS-CoV-2/FLU/RSV testing.  Fact Sheet for Patients: EntrepreneurPulse.com.au  Fact Sheet for Healthcare Providers: IncredibleEmployment.be  This test is not yet approved or cleared by the [place] FDA and has been authorized for detection and/or diagnosis of SARS-CoV-2 by FDA under an Emergency Use Authorization (EUA). This EUA will remain in effect (meaning this test can be used) for the duration of the COVID-19 declaration under Section 564(b)(1) of the Act,  21 U.S.C. section 360bbb-3(b)(1), unless the authorization is terminated or revoked.  Performed at [facility], [place] [address] [place], [place] [address]    Alcohol, Ethyl (B) 07/03/2022 <10  <10 mg/dL Final   Comment: (NOTE) Lowest detectable limit for serum alcohol is 10 mg/dL.  For medical purposes only. Performed at [facility], [place] [address] [place], [place] [address]    Opiates 07/03/2022 NONE DETECTED  NONE DETECTED Final   Cocaine 07/03/2022 POSITIVE (A)  NONE DETECTED Final   Benzodiazepines 07/03/2022 NONE DETECTED  NONE DETECTED Final   Amphetamines 07/03/2022 POSITIVE (A)  NONE DETECTED Final   Tetrahydrocannabinol 07/03/2022 NONE DETECTED  NONE DETECTED Final   Barbiturates 07/03/2022 NONE DETECTED  NONE DETECTED Final   Comment: (NOTE) DRUG SCREEN FOR MEDICAL PURPOSES ONLY.  IF CONFIRMATION IS NEEDED FOR ANY PURPOSE, NOTIFY LAB WITHIN 5 DAYS.  LOWEST DETECTABLE LIMITS FOR URINE DRUG SCREEN Drug Class                     Cutoff (ng/mL) Amphetamine and metabolites    1000 Barbiturate and metabolites    200 Benzodiazepine                 322 Tricyclics and  metabolites     300 Opiates and metabolites        300 Cocaine and metabolites        300 THC                            50 Performed at [facility], [place] [address] [place], [place] [address]   Admission on 04/11/2022, Discharged on 04/14/2022  Component Date Value Ref Range Status   SARS Coronavirus 2 by RT PCR 04/11/2022 NEGATIVE  NEGATIVE Final   Comment: (NOTE) SARS-CoV-2 target nucleic acids are NOT DETECTED.  The SARS-CoV-2 RNA is generally detectable in upper respiratory specimens during the acute phase of infection. The lowest concentration of SARS-CoV-2 viral copies this assay can detect is 138 copies/mL. A negative result does not preclude SARS-Cov-2 infection and should not be used as the sole basis for treatment or other patient management decisions. A negative result may occur with  improper specimen collection/handling, submission of specimen other than nasopharyngeal swab, presence of viral mutation(s) within the areas targeted by this assay, and inadequate number of viral copies(<138 copies/mL). A negative result must be combined with clinical observations, patient history, and epidemiological information. The expected result is Negative.  Fact Sheet for Patients:  EntrepreneurPulse.com.au  Fact Sheet for Healthcare Providers:  IncredibleEmployment.be  This test is no                          t yet approved or cleared by the [place] FDA and  has been authorized for detection and/or diagnosis of SARS-CoV-2 by FDA under an Emergency Use Authorization (EUA). This EUA will remain  in effect (meaning this test can be used) for the duration of the COVID-19 declaration under Section 564(b)(1) of the Act, 21 U.S.C.section 360bbb-3(b)(1), unless the authorization is terminated  or revoked sooner.       Influenza A by PCR 04/11/2022 NEGATIVE  NEGATIVE Final   Influenza B by PCR 04/11/2022 NEGATIVE   NEGATIVE Final   Comment: (NOTE) The Xpert Xpress SARS-CoV-2/FLU/RSV plus assay is intended as an aid in the diagnosis of influenza from Nasopharyngeal swab specimens  and should not be used as a sole basis for treatment. Nasal washings and aspirates are unacceptable for Xpert Xpress SARS-CoV-2/FLU/RSV testing.  Fact Sheet for Patients: EntrepreneurPulse.com.au  Fact Sheet for Healthcare Providers: IncredibleEmployment.be  This test is not yet approved or cleared by the [place] FDA and has been authorized for detection and/or diagnosis of SARS-CoV-2 by FDA under an Emergency Use Authorization (EUA). This EUA will remain in effect (meaning this test can be used) for the duration of the COVID-19 declaration under Section 564(b)(1) of the Act, 21 U.S.C. section 360bbb-3(b)(1), unless the authorization is terminated or revoked.  Performed at [facility], [place] [address], [place], [place] [address]    WBC 04/11/2022 13.9 (H)  4.0 - 10.5 K/uL Final   RBC 04/11/2022 4.37  4.22 - 5.81 MIL/uL Final   Hemoglobin 04/11/2022 13.0  13.0 - 17.0 g/dL Final   HCT 04/11/2022 40.0  39.0 - 52.0 % Final   MCV 04/11/2022 91.5  80.0 - 100.0 fL Final   MCH 04/11/2022 29.7  26.0 - 34.0 pg Final   MCHC 04/11/2022 32.5  30.0 - 36.0 g/dL Final   RDW 04/11/2022 15.1  11.5 - 15.5 % Final   Platelets 04/11/2022 322  150 - 400 K/uL Final   nRBC 04/11/2022 0.0  0.0 - 0.2 % Final   Neutrophils Relative % 04/11/2022 71  % Final   Neutro Abs 04/11/2022 9.9 (H)  1.7 - 7.7 K/uL Final   Lymphocytes Relative 04/11/2022 18  % Final   Lymphs Abs 04/11/2022 2.5  0.7 - 4.0 K/uL Final   Monocytes Relative 04/11/2022 8  % Final   Monocytes Absolute 04/11/2022 1.1 (H)  0.1 - 1.0 K/uL Final   Eosinophils Relative 04/11/2022 2  % Final   Eosinophils Absolute 04/11/2022 0.3  0.0 - 0.5 K/uL Final   Basophils Relative 04/11/2022 0  % Final   Basophils Absolute 04/11/2022 0.0   0.0 - 0.1 K/uL Final   Immature Granulocytes 04/11/2022 1  % Final   Abs Immature Granulocytes 04/11/2022 0.07  0.00 - 0.07 K/uL Final   Performed at [facility], [place] [address], [place], [place] [address]   Sodium 04/11/2022 138  135 - 145 mmol/L Final   Potassium 04/11/2022 4.3  3.5 - 5.1 mmol/L Final   Chloride 04/11/2022 104  98 - 111 mmol/L Final   CO2 04/11/2022 24  22 - 32 mmol/L Final   Glucose, Bld 04/11/2022 80  70 - 99 mg/dL Final   Glucose reference range applies only to samples taken after fasting for at least 8 hours.   BUN 04/11/2022 12  6 - 20 mg/dL Final   Creatinine, Ser 04/11/2022 1.17  0.61 - 1.24 mg/dL Final   Calcium 04/11/2022 9.2  8.9 - 10.3 mg/dL Final   Total Protein 04/11/2022 6.9  6.5 - 8.1 g/dL Final   Albumin 04/11/2022 3.9  3.5 - 5.0 g/dL Final   AST 04/11/2022 33  15 - 41 U/L Final   ALT 04/11/2022 47 (H)  0 - 44 U/L Final   Alkaline Phosphatase 04/11/2022 69  38 - 126 U/L Final   Total Bilirubin 04/11/2022 0.5  0.3 - 1.2 mg/dL Final   GFR, Estimated 04/11/2022 >60  >60 mL/min Final   Comment: (NOTE) Calculated using the CKD-EPI Creatinine Equation (2021)    Anion gap 04/11/2022 10  5 - 15 Final   Performed at [facility] [address] [place], [place] [address]  POC Amphetamine UR 04/11/2022 None Detected  NONE DETECTED (Cut Off Level 1000 ng/mL) Final   POC Secobarbital (BAR) 04/11/2022 None Detected  NONE DETECTED (Cut Off Level 300 ng/mL) Final   POC Buprenorphine (BUP) 04/11/2022 None Detected  NONE DETECTED (Cut Off Level 10 ng/mL) Final   POC Oxazepam (BZO) 04/11/2022 None Detected  NONE DETECTED (Cut Off Level 300 ng/mL) Final   POC Cocaine UR 04/11/2022 None Detected  NONE DETECTED (Cut Off Level 300 ng/mL) Final   POC Methamphetamine UR 04/11/2022 None Detected  NONE DETECTED (Cut Off Level 1000 ng/mL) Final   POC Morphine 04/11/2022 None Detected  NONE DETECTED (Cut Off Level 300 ng/mL) Final   POC Methadone UR 04/11/2022  None Detected  NONE DETECTED (Cut Off Level 300 ng/mL) Final   POC Oxycodone UR 04/11/2022 None Detected  NONE DETECTED (Cut Off Level 100 ng/mL) Final   POC Marijuana UR 04/11/2022 None Detected  NONE DETECTED (Cut Off Level 50 ng/mL) Final  Admission on 03/30/2022, Discharged on 04/01/2022  Component Date Value Ref Range Status   SARS Coronavirus 2 by RT PCR 03/30/2022 NEGATIVE  NEGATIVE Final   Comment: (NOTE) SARS-CoV-2 target nucleic acids are NOT DETECTED.  The SARS-CoV-2 RNA is generally detectable in upper respiratory specimens during the acute phase of infection. The lowest concentration of SARS-CoV-2 viral copies this assay can detect is 138 copies/mL. A negative result does not preclude SARS-Cov-2 infection and should not be used as the sole basis for treatment or other patient management decisions. A negative result may occur with  improper specimen collection/handling, submission of specimen other than nasopharyngeal swab, presence of viral mutation(s) within the areas targeted by this assay, and inadequate number of viral copies(<138 copies/mL). A negative result must be combined with clinical observations, patient history, and epidemiological information. The expected result is Negative.  Fact Sheet for Patients:  EntrepreneurPulse.com.au  Fact Sheet for Healthcare Providers:  IncredibleEmployment.be  This test is no                          t yet approved or cleared by the [place] FDA and  has been authorized for detection and/or diagnosis of SARS-CoV-2 by FDA under an Emergency Use Authorization (EUA). This EUA will remain  in effect (meaning this test can be used) for the duration of the COVID-19 declaration under Section 564(b)(1) of the Act, 21 U.S.C.section 360bbb-3(b)(1), unless the authorization is terminated  or revoked sooner.       Influenza A by PCR 03/30/2022 NEGATIVE  NEGATIVE Final   Influenza B by PCR  03/30/2022 NEGATIVE  NEGATIVE Final   Comment: (NOTE) The Xpert Xpress SARS-CoV-2/FLU/RSV plus assay is intended as an aid in the diagnosis of influenza from Nasopharyngeal swab specimens and should not be used as a sole basis for treatment. Nasal washings and aspirates are unacceptable for Xpert Xpress SARS-CoV-2/FLU/RSV testing.  Fact Sheet for Patients: EntrepreneurPulse.com.au  Fact Sheet for Healthcare Providers: IncredibleEmployment.be  This test is not yet approved or cleared by the [place] FDA and has been authorized for detection and/or diagnosis of SARS-CoV-2 by FDA under an Emergency Use Authorization (EUA). This EUA will remain in effect (meaning this test can be used) for the duration of the COVID-19 declaration under Section 564(b)(1) of the Act, 21 U.S.C. section 360bbb-3(b)(1), unless the authorization is terminated or revoked.  Performed at [facility], [place] [address] [place], [place] [address]    POC Amphetamine  UR 03/30/2022 Positive (A)  NONE DETECTED (Cut Off Level 1000 ng/mL) Final   POC Secobarbital (BAR) 03/30/2022 None Detected  NONE DETECTED (Cut Off Level 300 ng/mL) Final   POC Buprenorphine (BUP) 03/30/2022 None Detected  NONE DETECTED (Cut Off Level 10 ng/mL) Final   POC Oxazepam (BZO) 03/30/2022 None Detected  NONE DETECTED (Cut Off Level 300 ng/mL) Final   POC Cocaine UR 03/30/2022 None Detected  NONE DETECTED (Cut Off Level 300 ng/mL) Final   POC Methamphetamine UR 03/30/2022 Positive (A)  NONE DETECTED (Cut Off Level 1000 ng/mL) Final   POC Morphine 03/30/2022 None Detected  NONE DETECTED (Cut Off Level 300 ng/mL) Final   POC Methadone UR 03/30/2022 None Detected  NONE DETECTED (Cut Off Level 300 ng/mL) Final   POC Oxycodone UR 03/30/2022 None Detected  NONE DETECTED (Cut Off Level 100 ng/mL) Final   POC Marijuana UR 03/30/2022 Positive (A)  NONE DETECTED (Cut Off Level 50 ng/mL) Final    SARSCOV2ONAVIRUS 2 AG 03/30/2022 NEGATIVE  NEGATIVE Final   Comment: (NOTE) SARS-CoV-2 antigen NOT DETECTED.   Negative results are presumptive.  Negative results do not preclude SARS-CoV-2 infection and should not be used as the sole basis for treatment or other patient management decisions, including infection  control decisions, particularly in the presence of clinical signs and  symptoms consistent with COVID-19, or in those who have been in contact with the virus.  Negative results must be combined with clinical observations, patient history, and epidemiological information. The expected result is Negative.  Fact Sheet for Patients: HandmadeRecipes.com.cy  Fact Sheet for Healthcare Providers: FuneralLife.at  This test is not yet approved or cleared by the [place] FDA and  has been authorized for detection and/or diagnosis of SARS-CoV-2 by FDA under an Emergency Use Authorization (EUA).  This EUA will remain in effect (meaning this test can be used) for the duration of  the COV                          ID-19 declaration under Section 564(b)(1) of the Act, 21 U.S.C. section 360bbb-3(b)(1), unless the authorization is terminated or revoked sooner.    Admission on 03/29/2022, Discharged on 03/30/2022  Component Date Value Ref Range Status   Sodium 03/29/2022 142  135 - 145 mmol/L Final   Potassium 03/29/2022 3.5  3.5 - 5.1 mmol/L Final   Chloride 03/29/2022 110  98 - 111 mmol/L Final   CO2 03/29/2022 24  22 - 32 mmol/L Final   Glucose, Bld 03/29/2022 108 (H)  70 - 99 mg/dL Final   Glucose reference range applies only to samples taken after fasting for at least 8 hours.   BUN 03/29/2022 13  6 - 20 mg/dL Final   Creatinine, Ser 03/29/2022 1.19  0.61 - 1.24 mg/dL Final   Calcium 03/29/2022 9.2  8.9 - 10.3 mg/dL Final   Total Protein 03/29/2022 7.7  6.5 - 8.1 g/dL Final   Albumin 03/29/2022 4.2  3.5 - 5.0 g/dL Final   AST  03/29/2022 61 (H)  15 - 41 U/L Final   ALT 03/29/2022 48 (H)  0 - 44 U/L Final   Alkaline Phosphatase 03/29/2022 64  38 - 126 U/L Final   Total Bilirubin 03/29/2022 0.9  0.3 - 1.2 mg/dL Final   GFR, Estimated 03/29/2022 >60  >60 mL/min Final   Comment: (NOTE) Calculated using the CKD-EPI Creatinine Equation (2021)    Anion gap 03/29/2022 8  5 -  15 Final   Performed at [facility], [place] [address], [place], [place] [address]   Alcohol, Ethyl (B) 03/29/2022 <10  <10 mg/dL Final   Comment: (NOTE) Lowest detectable limit for serum alcohol is 10 mg/dL.  For medical purposes only. Performed at [facility], [place] [address] [place], [place] [address]    Salicylate Lvl 16/10/930 <7.0 (L)  7.0 - 30.0 mg/dL Final   Performed at [place] [address], [place], [place] [address]   Acetaminophen (Tylenol), Serum 03/29/2022 <10 (L)  10 - 30 ug/mL Final   Comment: (NOTE) Therapeutic concentrations vary significantly. A range of 10-30 ug/mL  may be an effective concentration for many patients. However, some  are best treated at concentrations outside of this range. Acetaminophen concentrations >150 ug/mL at 4 hours after ingestion  and >50 ug/mL at 12 hours after ingestion are often associated with  toxic reactions.  Performed at [facility], [place] [address] [place], [place] [address]    WBC 03/29/2022 7.5  4.0 - 10.5 K/uL Final   RBC 03/29/2022 4.21 (L)  4.22 - 5.81 MIL/uL Final   Hemoglobin 03/29/2022 12.2 (L)  13.0 - 17.0 g/dL Final   HCT 03/29/2022 38.1 (L)  39.0 - 52.0 % Final   MCV 03/29/2022 90.5  80.0 - 100.0 fL Final   MCH 03/29/2022 29.0  26.0 - 34.0 pg Final   MCHC 03/29/2022 32.0  30.0 - 36.0 g/dL Final   RDW 03/29/2022 15.3  11.5 - 15.5 % Final   Platelets 03/29/2022 228  150 - 400 K/uL Final   nRBC 03/29/2022 0.0  0.0 - 0.2 % Final   Performed at [facility], [place]  [address] [place], [place] [address]   Opiates 03/29/2022 NONE DETECTED  NONE DETECTED Final   Cocaine 03/29/2022 NONE DETECTED  NONE DETECTED Final   Benzodiazepines 03/29/2022 NONE DETECTED  NONE DETECTED Final   Amphetamines 03/29/2022 POSITIVE (A)  NONE DETECTED Final   Tetrahydrocannabinol 03/29/2022 POSITIVE (A)  NONE DETECTED Final   Barbiturates 03/29/2022 NONE DETECTED  NONE DETECTED Final   Comment: (NOTE) DRUG SCREEN FOR MEDICAL PURPOSES ONLY.  IF CONFIRMATION IS NEEDED FOR ANY PURPOSE, NOTIFY LAB WITHIN 5 DAYS.  LOWEST DETECTABLE LIMITS FOR URINE DRUG SCREEN Drug Class                     Cutoff (ng/mL) Amphetamine and metabolites    1000 Barbiturate and metabolites    200 Benzodiazepine                 237 Tricyclics and metabolites     300 Opiates and metabolites        300 Cocaine and metabolites        300 THC                            50 Performed at [facility], [place] [address] [place], [place] [address]    Troponin I (High Sensitivity) 03/29/2022 3  <18 ng/L Final   Comment: (NOTE) Elevated high sensitivity troponin I (hsTnI) values and significant  changes across serial measurements may suggest ACS but many other  chronic and acute conditions are known to elevate hsTnI results.  Refer to the "Links" section for chest pain algorithms and additional  guidance. Performed at [facility], [place] [address], [place], [place] [address]  SARS Coronavirus 2 by RT PCR 03/29/2022 NEGATIVE  NEGATIVE Final   Comment: (NOTE) SARS-CoV-2 target nucleic acids are NOT DETECTED.  The SARS-CoV-2 RNA is generally detectable in upper and lower respiratory specimens during the acute phase of infection. The lowest concentration of SARS-CoV-2 viral copies this assay can detect is 250 copies / mL. A negative result does not preclude SARS-CoV-2 infection and should not be used as the sole basis for treatment or other patient management  decisions.  A negative result may occur with improper specimen collection / handling, submission of specimen other than nasopharyngeal swab, presence of viral mutation(s) within the areas targeted by this assay, and inadequate number of viral copies (<250 copies / mL). A negative result must be combined with clinical observations, patient history, and epidemiological information.  Fact Sheet for Patients:   https://www.patel.info/  Fact Sheet for Healthcare Providers: https://hall.com/  This test is not yet approved or                           cleared by the [place] FDA and has been authorized for detection and/or diagnosis of SARS-CoV-2 by FDA under an Emergency Use Authorization (EUA).  This EUA will remain in effect (meaning this test can be used) for the duration of the COVID-19 declaration under Section 564(b)(1) of the Act, 21 U.S.C. section 360bbb-3(b)(1), unless the authorization is terminated or revoked sooner.  Performed at [facility], [name] [address], [place], [place] [address]    SARS Coronavirus 2 by RT PCR 03/30/2022 NEGATIVE  NEGATIVE Final   Comment: (NOTE) SARS-CoV-2 target nucleic acids are NOT DETECTED.  The SARS-CoV-2 RNA is generally detectable in upper respiratory specimens during the acute phase of infection. The lowest concentration of SARS-CoV-2 viral copies this assay can detect is 138 copies/mL. A negative result does not preclude SARS-Cov-2 infection and should not be used as the sole basis for treatment or other patient management decisions. A negative result may occur with  improper specimen collection/handling, submission of specimen other than nasopharyngeal swab, presence of viral mutation(s) within the areas targeted by this assay, and inadequate number of viral copies(<138 copies/mL). A negative result must be combined with clinical observations, patient history, and  epidemiological information. The expected result is Negative.  Fact Sheet for Patients:  EntrepreneurPulse.com.au  Fact Sheet for Healthcare Providers:  IncredibleEmployment.be  This test is no                          t yet approved or cleared by the [place] FDA and  has been authorized for detection and/or diagnosis of SARS-CoV-2 by FDA under an Emergency Use Authorization (EUA). This EUA will remain  in effect (meaning this test can be used) for the duration of the COVID-19 declaration under Section 564(b)(1) of the Act, 21 U.S.C.section 360bbb-3(b)(1), unless the authorization is terminated  or revoked sooner.       Influenza A by PCR 03/30/2022 NEGATIVE  NEGATIVE Final   Influenza B by PCR 03/30/2022 NEGATIVE  NEGATIVE Final   Comment: (NOTE) The Xpert Xpress SARS-CoV-2/FLU/RSV plus assay is intended as an aid in the diagnosis of influenza from Nasopharyngeal swab specimens and should not be used as a sole basis for treatment. Nasal washings and aspirates are unacceptable for Xpert Xpress SARS-CoV-2/FLU/RSV testing.  Fact Sheet for Patients: EntrepreneurPulse.com.au  Fact Sheet for Healthcare Providers: IncredibleEmployment.be  This test is not yet approved  or cleared by the [place] and has been authorized for detection and/or diagnosis of SARS-CoV-2 by FDA under an Emergency Use Authorization (EUA). This EUA will remain in effect (meaning this test can be used) for the duration of the COVID-19 declaration under Section 564(b)(1) of the Act, 21 U.S.C. section 360bbb-3(b)(1), unless the authorization is terminated or revoked.  Performed at [facility], [place] [address], [place], [place] [address]     Allergies: Fish allergy and Shellfish allergy  PTA Medications: (Not in a hospital admission)   Long Term Goals: Improvement in symptoms so as ready for  discharge  Short Term Goals: Patient will verbalize feelings in meetings with treatment team members.  Medical Decision Making  Patient was accepted to [facility])  - Home medication was restarted where appropriate  - CoW's protocol/ Clonidine taper - verbal order for PHQ9    Recommendations  Based on my evaluation the patient does not appear to have an emergency medical condition.  Derrill Center, NP 08/09/22  11:57 AM

## 2022-08-09 NOTE — ED Notes (Signed)
Pt awake and alert.  sitting in day room watching tv and talking with peers.  Flat affect that brightens upon engagement congruent mood.   Pt attended [place] meeting.   Has been taking medications as ordered.  Denies SI, HI or AVH.

## 2022-08-09 NOTE — ED Notes (Signed)
Pt sleeping@this time. Breathing even and unlabored. Will continue to monitor for safety 

## 2022-08-09 NOTE — ED Notes (Signed)
Patient admitted t [facility] from [facility] due to polysubstance use and desire to detox from them.  Patient is well known to unit.  He is fidgety, demoralized and negativistic.  Patient reports not feeling well with decreased appetite and inability to sleep.  He has been pacing since arrival on unit.  He is pleasant and cooperative however appears overwhelmed by current situation.  He denies avh shi or plan.  Patient encouraged to make needs known.  Will monitor and provide safe environment.

## 2022-08-09 NOTE — ED Provider Notes (Signed)
[facility] Continuous Assessment Admission H&P  Date: 08/09/22 Patient Name: Brian [name] MRN: [id number] Chief Complaint: "Seeking help for substance abuse and mental health".    Diagnoses:  Final diagnoses:  Polysubstance dependence including opioid type drug, episodic abuse (HCC)    HPI: Brian [name] is a 36 year old male with past psychiatric history of polysubstance abuse, bipolar affective disorder, THC abuse, SI, alcohol dependence, homelessness, opioid use disorder, amphetamine use disorder, anxiety and treatment non-compliance, who presented voluntarily as a walk-in to [facility] seeking help for substance abuse treatment and mental health.    Per chart review, patient is well-known to behavioral health services line with frequent ED visits for substance abuse and substance-induced mood disorder.  Patient's most recent ED visit was on 08/03/2022 under IVC for manic behavior due to substance abuse.  Patient was offered residential treatment option for substance abuse, but declined, stating it was too long of a program.  Patient was then provided with outpatient psychiatric resources in addition to a 7-day supply of his Wellbutrin and gabapentin.  Patient was encouraged to follow-up with the [facility] outpatient psychiatric office.  Patient reported that prior to presenting to the ED on 08/13/2022, he was seen 2 weeks earlier by his psychiatric provider [facility] [place] for medication management.  Patient reports his provider was able to get him a bed at [facility] for substance abuse treatment.  Patient reports he left that program AMA because "they didn't want to give me the medications I asked for".  On his visit today, patient reports "I'm a recovering addict, and I've been in mental health my whole life".  Patient reports he has been using substances since age 1.   Patient reports he was able to get and remain clean from 2017-20 21 through the 12-step program,  taking his medications, and working.  Patient reports he relapsed off and on after 2021, but his drug use worsened after his birthday last year when his grandmother died.  Patient reports "I watched the casket close on her and I have not been the same ever since".  Reports he is currently abusing cocaine-last used a couple of days ago, MDMA-last used 2 days ago, benzos-last used a couple of days ago, alcohol-drinks about a pint of whiskey daily and last used a couple of days ago.  Patient reports he began abusing alcohol a few months ago.  Fentanyl-last used a couple of days ago, Multimedia programmer used a couple of days ago, and Youth worker used yesterday morning.  Patient endorsed a history of alcohol withdrawal seizures 5 years ago.  The patient denies current withdrawal symptoms.  Patient reports "I don't really get withdrawals these days because I'm using other drugs in addition to the alcohol".  Patient reports his psychiatric provider prescribed Wellbutrin for his depression, Seroquel for mood stabilization, and gabapentin for his neuropathy.  Patient reports he is medication noncompliant because "I don't think these medications really work, and I don't want to keep taking them, but I would like to try something else".  Patient declined to endorse or deny if he is having suicidal ideations.  But states "I've been trying to hurt myself by taking these drugs because I have a bad heart".  "I'm just not where I wanna be, I don't know why God keeps me here and takes these young kids".  Patient denies HI, denies AVH or paranoia.  Patient reports his sleep as poor, and appetite as good.  Patient denies access or means to  a gun or other weapons.  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  Patient reports he is homeless, but staying with friends who are currently drug users.  Patient reports he was living with his girlfriend who is now in recovery.  Support encouragement and reassurance provided about  ongoing stressors.  Patient provided with opportunity for questions.  On evaluation, patient is alert, oriented x 3, and cooperative. Speech is clear and coherent. Pt appears casual. Eye contact is good. Mood is anxious, affect is congruent with mood. Thought process and thought content is coherent. Pt endorses passive SI, denies HI/AVH. There is no indication that the patient is responding to internal stimuli. No delusions elicited during this assessment.     PHQ 2-9:  [place] ED from 07/06/2022 in [facility] ED from 04/11/2022 in [facility] ED from 03/30/2022 in [facility]  Thoughts that you would be better off dead, or of hurting yourself in some way Not at all Not at all Not at all  PHQ-9 Total Score _0 Flowsheet Row ED from 08/01/2022 in [place] ED from 07/06/2022 in [facility] ED from 07/04/2022 in [place] DEPT  C-SSRS RISK CATEGORY No Risk No Risk No Risk        Total Time spent with patient: 20 minutes  Musculoskeletal  Strength & Muscle Tone: within normal limits Gait & Station: normal Patient leans: N/A  Psychiatric Specialty Exam  Presentation General Appearance:  Casual  Eye Contact: Good  Speech: Clear and Coherent  Speech Volume: Normal  Handedness: Right   Mood and Affect  Mood: Anxious  Affect: Congruent   Thought Process  Thought Processes: Coherent  Descriptions of Associations:Intact  Orientation:Full (Time, Place and Person)  Thought Content:WDL  Diagnosis of Schizophrenia or Schizoaffective disorder in past: No   Hallucinations:Hallucinations: None  Ideas of Reference:None  Suicidal Thoughts:Suicidal Thoughts: Yes, Passive SI Passive Intent and/or Plan: With Intent  Homicidal Thoughts:Homicidal Thoughts: No   Sensorium   Memory: Immediate Fair  Judgment: Poor  Insight: Shallow   Executive Functions  Concentration: Fair  Attention Span: Fair  Recall: [employer] of Knowledge: Fair  Language: Fair   Psychomotor Activity  Psychomotor Activity: Psychomotor Activity: Normal   Assets  Assets: Communication Skills; Desire for Improvement   Sleep  Sleep: Sleep: Poor   Nutritional Assessment (For OBS and FBC admissions only) Has the patient had a weight loss or gain of 10 pounds or more in the last 3 months?: No Has the patient had a decrease in food intake/or appetite?: No Does the patient have dental problems?: No Does the patient have eating habits or behaviors that may be indicators of an eating disorder including binging or inducing vomiting?: No Has the patient recently lost weight without trying?: 0 Has the patient been eating poorly because of a decreased appetite?: 0 Malnutrition Screening Tool Score: 0    Physical Exam Constitutional:      General: He is not in acute distress.    Appearance: He is not diaphoretic.  HENT:     Head: Normocephalic.     Right Ear: External ear normal.     Left Ear: External ear normal.     Nose: No congestion.  Eyes:     General:        Right eye: No discharge.  Left eye: No discharge.  Cardiovascular:     Rate and Rhythm: Normal rate.  Pulmonary:     Effort: No respiratory distress.  Chest:     Chest wall: No tenderness.  Neurological:     Mental Status: He is alert and oriented to person, place, and time.  Psychiatric:        Attention and Perception: Attention and perception normal.        Mood and Affect: Affect normal. Mood is anxious.        Speech: Speech normal.        Behavior: Behavior is cooperative.        Thought Content: Thought content normal. Thought content is not paranoid or delusional. Thought content does not include homicidal or suicidal ideation. Thought content does not include homicidal or  suicidal plan.        Cognition and Memory: Cognition and memory normal.        Judgment: Judgment is impulsive.    Review of Systems  Constitutional:  Negative for chills, diaphoresis and fever.  HENT:  Negative for congestion.   Eyes:  Negative for discharge.  Respiratory:  Negative for cough, shortness of breath and wheezing.   Cardiovascular:  Negative for chest pain and palpitations.  Gastrointestinal:  Negative for diarrhea, nausea and vomiting.  Neurological:  Negative for dizziness, seizures, loss of consciousness, weakness and headaches.  Psychiatric/Behavioral:  Positive for substance abuse. Negative for depression and suicidal ideas. The patient is nervous/anxious.     Blood pressure (!) 131/96, pulse 92, temperature 98.3 F (36.8 C), temperature source Oral, resp. rate 18, SpO2 98 %. There is no height or weight on file to calculate BMI.  Past Psychiatric History: See H & P   Is the patient at risk to self? Yes  Has the patient been a risk to self in the past 6 months? Yes .    Has the patient been a risk to self within the distant past? Yes   Is the patient a risk to others? No   Has the patient been a risk to others in the past 6 months? Yes   Has the patient been a risk to others within the distant past? No   Past Medical History:  Past Medical History:  Diagnosis Date   Alcohol abuse    Anxiety    Bipolar 2 disorder ([place])    Cocaine abuse ([place])    Depression    Kidney stones    Methamphetamine abuse, episodic ([place])    Myocardial infarction ([place])    Narcotic abuse ([place])    Peptic ulcer    Polysubstance abuse ([employer])    Renal disorder    kidney stones    Past Surgical History:  Procedure Laterality Date   ESOPHAGOGASTRODUODENOSCOPY N/A 05/29/2014   Procedure: ESOPHAGOGASTRODUODENOSCOPY (EGD) ;  Surgeon: Beryle Beams, MD;  Location: [facility] ENDOSCOPY;  Service: Endoscopy;  Laterality: N/A;  check with Dr. Collene Mares about sedation type/timinng - I recommend MAC   HAND  SURGERY  2006   LEFT HEART CATH AND CORONARY ANGIOGRAPHY N/A 06/21/2020   Procedure: LEFT HEART CATH AND CORONARY ANGIOGRAPHY;  Surgeon: Nelva Bush, MD;  Location: [facility];  Service: Cardiovascular;  Laterality: N/A;    Family History:  Family History  Problem Relation Age of Onset   Heart disease Father     Social History:  Social History   Socioeconomic History   Marital status: Single    Spouse name: Not on  file   Number of children: Not on file   Years of education: Not on file   Highest education level: Not on file  Occupational History   Not on file  Tobacco Use   Smoking status: Every Day    Packs/day: 0.50    Types: Cigarettes   Smokeless tobacco: Never  Vaping Use   Vaping Use: Never used  Substance and Sexual Activity   Alcohol use: Not Currently   Drug use: Yes    Types: IV, Methamphetamines   Sexual activity: Not Currently  Other Topics Concern   Not on file  Social History Narrative   ** Merged History Encounter **       Lives with a friend   Lost house and custody of son 9broke up with fiancee)   Nature conservation officer   Social Determinants of Health   Financial Resource Strain: Not on file  Food Insecurity: Not on file  Transportation Needs: Not on file  Physical Activity: Not on file  Stress: Not on file  Social Connections: Not on file  Intimate Partner Violence: Not on file    SDOH:  [place]: High Risk (08/09/2020)  Alcohol Screen: Low Risk  (12/21/2020)  Depression (PHQ2-9): Medium Risk (07/06/2022)  Tobacco Use: High Risk (08/01/2022)    Last Labs:  Admission on 08/01/2022, Discharged on 08/03/2022  Component Date Value Ref Range Status   Sodium 08/01/2022 142  135 - 145 mmol/L Final   Potassium 08/01/2022 3.8  3.5 - 5.1 mmol/L Final   Chloride 08/01/2022 111  98 - 111 mmol/L Final   CO2 08/01/2022 22  22 - 32 mmol/L Final   Glucose, Bld 08/01/2022 94  70 - 99 mg/dL Final   Glucose reference range  applies only to samples taken after fasting for at least 8 hours.   BUN 08/01/2022 12  6 - 20 mg/dL Final   Creatinine, Ser 08/01/2022 1.50 (H)  0.61 - 1.24 mg/dL Final   Calcium 08/01/2022 8.9  8.9 - 10.3 mg/dL Final   Total Protein 08/01/2022 6.9  6.5 - 8.1 g/dL Final   Albumin 08/01/2022 3.8  3.5 - 5.0 g/dL Final   AST 08/01/2022 38  15 - 41 U/L Final   ALT 08/01/2022 29  0 - 44 U/L Final   Alkaline Phosphatase 08/01/2022 64  38 - 126 U/L Final   Total Bilirubin 08/01/2022 0.7  0.3 - 1.2 mg/dL Final   GFR, Estimated 08/01/2022 >60  >60 mL/min Final   Comment: (NOTE) Calculated using the CKD-EPI Creatinine Equation (2021)    Anion gap 08/01/2022 9  5 - 15 Final   Performed at [facility] [address], [place], [place] [address]   Alcohol, Ethyl (B) 08/01/2022 <10  <10 mg/dL Final   Comment: (NOTE) Lowest detectable limit for serum alcohol is 10 mg/dL.  For medical purposes only. Performed at [facility], [place] [address] [place], [place] [address]    Opiates 08/01/2022 NONE DETECTED  NONE DETECTED Final   Cocaine 08/01/2022 POSITIVE (A)  NONE DETECTED Final   Benzodiazepines 08/01/2022 NONE DETECTED  NONE DETECTED Final   Amphetamines 08/01/2022 POSITIVE (A)  NONE DETECTED Final   Tetrahydrocannabinol 08/01/2022 POSITIVE (A)  NONE DETECTED Final   Barbiturates 08/01/2022 NONE DETECTED  NONE DETECTED Final   Comment: (NOTE) DRUG SCREEN FOR MEDICAL PURPOSES ONLY.  IF CONFIRMATION IS NEEDED FOR ANY PURPOSE, NOTIFY LAB WITHIN 5 DAYS.  LOWEST DETECTABLE LIMITS FOR URINE DRUG  SCREEN Drug Class                     Cutoff (ng/mL) Amphetamine and metabolites    1000 Barbiturate and metabolites    200 Benzodiazepine                 160 Tricyclics and metabolites     300 Opiates and metabolites        300 Cocaine and metabolites        300 THC                            50 Performed at [facility], [place] [address], [place], [place] [address]    WBC  08/01/2022 11.3 (H)  4.0 - 10.5 K/uL Final   RBC 08/01/2022 4.16 (L)  4.22 - 5.81 MIL/uL Final   Hemoglobin 08/01/2022 12.5 (L)  13.0 - 17.0 g/dL Final   HCT 08/01/2022 38.4 (L)  39.0 - 52.0 % Final   MCV 08/01/2022 92.3  80.0 - 100.0 fL Final   MCH 08/01/2022 30.0  26.0 - 34.0 pg Final   MCHC 08/01/2022 32.6  30.0 - 36.0 g/dL Final   RDW 08/01/2022 15.2  11.5 - 15.5 % Final   Platelets 08/01/2022 247  150 - 400 K/uL Final   nRBC 08/01/2022 0.0  0.0 - 0.2 % Final   Neutrophils Relative % 08/01/2022 78  % Final   Neutro Abs 08/01/2022 8.7 (H)  1.7 - 7.7 K/uL Final   Lymphocytes Relative 08/01/2022 10  % Final   Lymphs Abs 08/01/2022 1.2  0.7 - 4.0 K/uL Final   Monocytes Relative 08/01/2022 10  % Final   Monocytes Absolute 08/01/2022 1.1 (H)  0.1 - 1.0 K/uL Final   Eosinophils Relative 08/01/2022 2  % Final   Eosinophils Absolute 08/01/2022 0.2  0.0 - 0.5 K/uL Final   Basophils Relative 08/01/2022 0  % Final   Basophils Absolute 08/01/2022 0.1  0.0 - 0.1 K/uL Final   Immature Granulocytes 08/01/2022 0  % Final   Abs Immature Granulocytes 08/01/2022 0.03  0.00 - 0.07 K/uL Final   Performed at [facility], [place] [address] [place], [place] [address]  Admission on 07/06/2022, Discharged on 07/07/2022  Component Date Value Ref Range Status   SARS Coronavirus 2 by RT PCR 07/06/2022 NEGATIVE  NEGATIVE Final   Comment: (NOTE) SARS-CoV-2 target nucleic acids are NOT DETECTED.  The SARS-CoV-2 RNA is generally detectable in upper respiratory specimens during the acute phase of infection. The lowest concentration of SARS-CoV-2 viral copies this assay can detect is 138 copies/mL. A negative result does not preclude SARS-Cov-2 infection and should not be used as the sole basis for treatment or other patient management decisions. A negative result may occur with  improper specimen collection/handling, submission of specimen other than nasopharyngeal swab, presence of viral mutation(s)  within the areas targeted by this assay, and inadequate number of viral copies(<138 copies/mL). A negative result must be combined with clinical observations, patient history, and epidemiological information. The expected result is Negative.  Fact Sheet for Patients:  EntrepreneurPulse.com.au  Fact Sheet for Healthcare Providers:  IncredibleEmployment.be  This test is no                          t yet approved or cleared by the [place] FDA and  has been authorized for detection  and/or diagnosis of SARS-CoV-2 by FDA under an Emergency Use Authorization (EUA). This EUA will remain  in effect (meaning this test can be used) for the duration of the COVID-19 declaration under Section 564(b)(1) of the Act, 21 U.S.C.section 360bbb-3(b)(1), unless the authorization is terminated  or revoked sooner.       Influenza A by PCR 07/06/2022 NEGATIVE  NEGATIVE Final   Influenza B by PCR 07/06/2022 NEGATIVE  NEGATIVE Final   Comment: (NOTE) The Xpert Xpress SARS-CoV-2/FLU/RSV plus assay is intended as an aid in the diagnosis of influenza from Nasopharyngeal swab specimens and should not be used as a sole basis for treatment. Nasal washings and aspirates are unacceptable for Xpert Xpress SARS-CoV-2/FLU/RSV testing.  Fact Sheet for Patients: EntrepreneurPulse.com.au  Fact Sheet for Healthcare Providers: IncredibleEmployment.be  This test is not yet approved or cleared by the [place] FDA and has been authorized for detection and/or diagnosis of SARS-CoV-2 by FDA under an Emergency Use Authorization (EUA). This EUA will remain in effect (meaning this test can be used) for the duration of the COVID-19 declaration under Section 564(b)(1) of the Act, 21 U.S.C. section 360bbb-3(b)(1), unless the authorization is terminated or revoked.  Performed at [facility], [place] [address] [place], [place] [address]     WBC 07/06/2022 10.2  4.0 - 10.5 K/uL Final   RBC 07/06/2022 4.50  4.22 - 5.81 MIL/uL Final   Hemoglobin 07/06/2022 13.4  13.0 - 17.0 g/dL Final   HCT 07/06/2022 39.3  39.0 - 52.0 % Final   MCV 07/06/2022 87.3  80.0 - 100.0 fL Final   MCH 07/06/2022 29.8  26.0 - 34.0 pg Final   MCHC 07/06/2022 34.1  30.0 - 36.0 g/dL Final   RDW 07/06/2022 13.2  11.5 - 15.5 % Final   Platelets 07/06/2022 273  150 - 400 K/uL Final   nRBC 07/06/2022 0.0  0.0 - 0.2 % Final   Neutrophils Relative % 07/06/2022 68  % Final   Neutro Abs 07/06/2022 6.9  1.7 - 7.7 K/uL Final   Lymphocytes Relative 07/06/2022 20  % Final   Lymphs Abs 07/06/2022 2.0  0.7 - 4.0 K/uL Final   Monocytes Relative 07/06/2022 10  % Final   Monocytes Absolute 07/06/2022 1.0  0.1 - 1.0 K/uL Final   Eosinophils Relative 07/06/2022 1  % Final   Eosinophils Absolute 07/06/2022 0.1  0.0 - 0.5 K/uL Final   Basophils Relative 07/06/2022 1  % Final   Basophils Absolute 07/06/2022 0.1  0.0 - 0.1 K/uL Final   Immature Granulocytes 07/06/2022 0  % Final   Abs Immature Granulocytes 07/06/2022 0.03  0.00 - 0.07 K/uL Final   Performed at [facility], [place] [address], [place], [place] [address]   Sodium 07/06/2022 137  135 - 145 mmol/L Final   Potassium 07/06/2022 4.0  3.5 - 5.1 mmol/L Final   Chloride 07/06/2022 102  98 - 111 mmol/L Final   CO2 07/06/2022 25  22 - 32 mmol/L Final   Glucose, Bld 07/06/2022 83  70 - 99 mg/dL Final   Glucose reference range applies only to samples taken after fasting for at least 8 hours.   BUN 07/06/2022 16  6 - 20 mg/dL Final   Creatinine, Ser 07/06/2022 1.18  0.61 - 1.24 mg/dL Final   Calcium 07/06/2022 9.6  8.9 - 10.3 mg/dL Final   Total Protein 07/06/2022 7.2  6.5 - 8.1 g/dL Final   Albumin 07/06/2022 3.9  3.5 - 5.0  g/dL Final   AST 07/06/2022 72 (H)  15 - 41 U/L Final   ALT 07/06/2022 60 (H)  0 - 44 U/L Final   Alkaline Phosphatase 07/06/2022 82  38 - 126 U/L Final   Total Bilirubin 07/06/2022 0.6  0.3  - 1.2 mg/dL Final   GFR, Estimated 07/06/2022 >60  >60 mL/min Final   Comment: (NOTE) Calculated using the CKD-EPI Creatinine Equation (2021)    Anion gap 07/06/2022 10  5 - 15 Final   Performed at [facility], [place] [address], [place],  [address]   Alcohol, Ethyl (B) 07/06/2022 <10  <10 mg/dL Final   Comment: (NOTE) Lowest detectable limit for serum alcohol is 10 mg/dL.  For medical purposes only. Performed at [facility], [place] [address] [place], [place] [address]    POC Amphetamine UR 07/06/2022 Positive (PE)  NONE DETECTED (Cut Off Level 1000 ng/mL) Final   POC Secobarbital (BAR) 07/06/2022 None Detected  NONE DETECTED (Cut Off Level 300 ng/mL) Final   POC Buprenorphine (BUP) 07/06/2022 Positive (PE)  NONE DETECTED (Cut Off Level 10 ng/mL) Final   POC Oxazepam (BZO) 07/06/2022 None Detected  NONE DETECTED (Cut Off Level 300 ng/mL) Final   POC Cocaine UR 07/06/2022 None Detected  NONE DETECTED (Cut Off Level 300 ng/mL) Final   POC Methamphetamine UR 07/06/2022 Positive (PE)  NONE DETECTED (Cut Off Level 1000 ng/mL) Final   POC Morphine 07/06/2022 None Detected  NONE DETECTED (Cut Off Level 300 ng/mL) Final   POC Methadone UR 07/06/2022 None Detected  NONE DETECTED (Cut Off Level 300 ng/mL) Final   POC Oxycodone UR 07/06/2022 None Detected  NONE DETECTED (Cut Off Level 100 ng/mL) Final   POC Marijuana UR 07/06/2022 None Detected  NONE DETECTED (Cut Off Level 50 ng/mL) Final   SARSCOV2ONAVIRUS 2 AG 07/06/2022 NEGATIVE  NEGATIVE Final   Comment: (NOTE) SARS-CoV-2 antigen NOT DETECTED.   Negative results are presumptive.  Negative results do not preclude SARS-CoV-2 infection and should not be used as the sole basis for treatment or other patient management decisions, including infection  control decisions, particularly in the presence of clinical signs and  symptoms consistent with COVID-19, or in those who have been in contact with the virus.  Negative results  must be combined with clinical observations, patient history, and epidemiological information. The expected result is Negative.  Fact Sheet for Patients: HandmadeRecipes.com.cy  Fact Sheet for Healthcare Providers: FuneralLife.at  This test is not yet approved or cleared by the [place] FDA and  has been authorized for detection and/or diagnosis of SARS-CoV-2 by FDA under an Emergency Use Authorization (EUA).  This EUA will remain in effect (meaning this test can be used) for the duration of  the COV                          ID-19 declaration under Section 564(b)(1) of the Act, 21 U.S.C. section 360bbb-3(b)(1), unless the authorization is terminated or revoked sooner.     Cholesterol 07/06/2022 137  0 - 200 mg/dL Final   Triglycerides 07/06/2022 56  <150 mg/dL Final   HDL 07/06/2022 45  >40 mg/dL Final   Total CHOL/HDL Ratio 07/06/2022 3.0  RATIO Final   VLDL 07/06/2022 11  0 - 40 mg/dL Final   LDL Cholesterol 07/06/2022 81  0 - 99 mg/dL Final   Comment:        Total Cholesterol/HDL:CHD Risk Coronary Heart Disease Risk  Table                     Men   Women  1/2 Average Risk   3.4   3.3  Average Risk       5.0   4.4  2 X Average Risk   9.6   7.1  3 X Average Risk  23.4   11.0        Use the calculated Patient Ratio above and the CHD Risk Table to determine the patient's CHD Risk.        ATP III CLASSIFICATION (LDL):  <100     mg/dL   Optimal  100-129  mg/dL   Near or Above                    Optimal  130-159  mg/dL   Borderline  160-189  mg/dL   High  >190     mg/dL   Very High Performed at [facility] [address] [place], [place] [address]    TSH 07/06/2022 1.168  0.350 - 4.500 uIU/mL Final   Comment: Performed by a 3rd Generation assay with a functional sensitivity of <=0.01 uIU/mL. Performed at [facility], [place] [address], [place], [place] [address]   Admission on 07/03/2022, Discharged on  07/03/2022  Component Date Value Ref Range Status   SARS Coronavirus 2 by RT PCR 07/03/2022 NEGATIVE  NEGATIVE Final   Comment: (NOTE) SARS-CoV-2 target nucleic acids are NOT DETECTED.  The SARS-CoV-2 RNA is generally detectable in upper respiratory specimens during the acute phase of infection. The lowest concentration of SARS-CoV-2 viral copies this assay can detect is 138 copies/mL. A negative result does not preclude SARS-Cov-2 infection and should not be used as the sole basis for treatment or other patient management decisions. A negative result may occur with  improper specimen collection/handling, submission of specimen other than nasopharyngeal swab, presence of viral mutation(s) within the areas targeted by this assay, and inadequate number of viral copies(<138 copies/mL). A negative result must be combined with clinical observations, patient history, and epidemiological information. The expected result is Negative.  Fact Sheet for Patients:  EntrepreneurPulse.com.au  Fact Sheet for Healthcare Providers:  IncredibleEmployment.be  This test is no                          t yet approved or cleared by the [place] FDA and  has been authorized for detection and/or diagnosis of SARS-CoV-2 by FDA under an Emergency Use Authorization (EUA). This EUA will remain  in effect (meaning this test can be used) for the duration of the COVID-19 declaration under Section 564(b)(1) of the Act, 21 U.S.C.section 360bbb-3(b)(1), unless the authorization is terminated  or revoked sooner.       Influenza A by PCR 07/03/2022 NEGATIVE  NEGATIVE Final   Influenza B by PCR 07/03/2022 NEGATIVE  NEGATIVE Final   Comment: (NOTE) The Xpert Xpress SARS-CoV-2/FLU/RSV plus assay is intended as an aid in the diagnosis of influenza from Nasopharyngeal swab specimens and should not be used as a sole basis for treatment. Nasal washings and aspirates are  unacceptable for Xpert Xpress SARS-CoV-2/FLU/RSV testing.  Fact Sheet for Patients: EntrepreneurPulse.com.au  Fact Sheet for Healthcare Providers: IncredibleEmployment.be  This test is not yet approved or cleared by the [place] FDA and has been authorized for detection and/or diagnosis of SARS-CoV-2 by FDA under an Emergency Use Authorization (  EUA). This EUA will remain in effect (meaning this test can be used) for the duration of the COVID-19 declaration under Section 564(b)(1) of the Act, 21 U.S.C. section 360bbb-3(b)(1), unless the authorization is terminated or revoked.  Performed at [facility], [place] [address] [place], [place] [address]    Alcohol, Ethyl (B) 07/03/2022 <10  <10 mg/dL Final   Comment: (NOTE) Lowest detectable limit for serum alcohol is 10 mg/dL.  For medical purposes only. Performed at [facility], [place] [address], [place], [place] [address]    Opiates 07/03/2022 NONE DETECTED  NONE DETECTED Final   Cocaine 07/03/2022 POSITIVE (A)  NONE DETECTED Final   Benzodiazepines 07/03/2022 NONE DETECTED  NONE DETECTED Final   Amphetamines 07/03/2022 POSITIVE (A)  NONE DETECTED Final   Tetrahydrocannabinol 07/03/2022 NONE DETECTED  NONE DETECTED Final   Barbiturates 07/03/2022 NONE DETECTED  NONE DETECTED Final   Comment: (NOTE) DRUG SCREEN FOR MEDICAL PURPOSES ONLY.  IF CONFIRMATION IS NEEDED FOR ANY PURPOSE, NOTIFY LAB WITHIN 5 DAYS.  LOWEST DETECTABLE LIMITS FOR URINE DRUG SCREEN Drug Class                     Cutoff (ng/mL) Amphetamine and metabolites    1000 Barbiturate and metabolites    200 Benzodiazepine                 767 Tricyclics and metabolites     300 Opiates and metabolites        300 Cocaine and metabolites        300 THC                            50 Performed at [facility], [place] [address] [place], [place] [address]   Admission on  04/11/2022, Discharged on 04/14/2022  Component Date Value Ref Range Status   SARS Coronavirus 2 by RT PCR 04/11/2022 NEGATIVE  NEGATIVE Final   Comment: (NOTE) SARS-CoV-2 target nucleic acids are NOT DETECTED.  The SARS-CoV-2 RNA is generally detectable in upper respiratory specimens during the acute phase of infection. The lowest concentration of SARS-CoV-2 viral copies this assay can detect is 138 copies/mL. A negative result does not preclude SARS-Cov-2 infection and should not be used as the sole basis for treatment or other patient management decisions. A negative result may occur with  improper specimen collection/handling, submission of specimen other than nasopharyngeal swab, presence of viral mutation(s) within the areas targeted by this assay, and inadequate number of viral copies(<138 copies/mL). A negative result must be combined with clinical observations, patient history, and epidemiological information. The expected result is Negative.  Fact Sheet for Patients:  EntrepreneurPulse.com.au  Fact Sheet for Healthcare Providers:  IncredibleEmployment.be  This test is no                          t yet approved or cleared by the [place] FDA and  has been authorized for detection and/or diagnosis of SARS-CoV-2 by FDA under an Emergency Use Authorization (EUA). This EUA will remain  in effect (meaning this test can be used) for the duration of the COVID-19 declaration under Section 564(b)(1) of the Act, 21 U.S.C.section 360bbb-3(b)(1), unless the authorization is terminated  or revoked sooner.       Influenza A by PCR 04/11/2022 NEGATIVE  NEGATIVE Final   Influenza B by PCR 04/11/2022 NEGATIVE  NEGATIVE  Final   Comment: (NOTE) The Xpert Xpress SARS-CoV-2/FLU/RSV plus assay is intended as an aid in the diagnosis of influenza from Nasopharyngeal swab specimens and should not be used as a sole basis for treatment. Nasal washings  and aspirates are unacceptable for Xpert Xpress SARS-CoV-2/FLU/RSV testing.  Fact Sheet for Patients: EntrepreneurPulse.com.au  Fact Sheet for Healthcare Providers: IncredibleEmployment.be  This test is not yet approved or cleared by the [place] FDA and has been authorized for detection and/or diagnosis of SARS-CoV-2 by FDA under an Emergency Use Authorization (EUA). This EUA will remain in effect (meaning this test can be used) for the duration of the COVID-19 declaration under Section 564(b)(1) of the Act, 21 U.S.C. section 360bbb-3(b)(1), unless the authorization is terminated or revoked.  Performed at [facility], [place] [address] [place], [place] [address]    WBC 04/11/2022 13.9 (H)  4.0 - 10.5 K/uL Final   RBC 04/11/2022 4.37  4.22 - 5.81 MIL/uL Final   Hemoglobin 04/11/2022 13.0  13.0 - 17.0 g/dL Final   HCT 04/11/2022 40.0  39.0 - 52.0 % Final   MCV 04/11/2022 91.5  80.0 - 100.0 fL Final   MCH 04/11/2022 29.7  26.0 - 34.0 pg Final   MCHC 04/11/2022 32.5  30.0 - 36.0 g/dL Final   RDW 04/11/2022 15.1  11.5 - 15.5 % Final   Platelets 04/11/2022 322  150 - 400 K/uL Final   nRBC 04/11/2022 0.0  0.0 - 0.2 % Final   Neutrophils Relative % 04/11/2022 71  % Final   Neutro Abs 04/11/2022 9.9 (H)  1.7 - 7.7 K/uL Final   Lymphocytes Relative 04/11/2022 18  % Final   Lymphs Abs 04/11/2022 2.5  0.7 - 4.0 K/uL Final   Monocytes Relative 04/11/2022 8  % Final   Monocytes Absolute 04/11/2022 1.1 (H)  0.1 - 1.0 K/uL Final   Eosinophils Relative 04/11/2022 2  % Final   Eosinophils Absolute 04/11/2022 0.3  0.0 - 0.5 K/uL Final   Basophils Relative 04/11/2022 0  % Final   Basophils Absolute 04/11/2022 0.0  0.0 - 0.1 K/uL Final   Immature Granulocytes 04/11/2022 1  % Final   Abs Immature Granulocytes 04/11/2022 0.07  0.00 - 0.07 K/uL Final   Performed at [facility], [place] [address], [place], [place] [address]   Sodium 04/11/2022 138   135 - 145 mmol/L Final   Potassium 04/11/2022 4.3  3.5 - 5.1 mmol/L Final   Chloride 04/11/2022 104  98 - 111 mmol/L Final   CO2 04/11/2022 24  22 - 32 mmol/L Final   Glucose, Bld 04/11/2022 80  70 - 99 mg/dL Final   Glucose reference range applies only to samples taken after fasting for at least 8 hours.   BUN 04/11/2022 12  6 - 20 mg/dL Final   Creatinine, Ser 04/11/2022 1.17  0.61 - 1.24 mg/dL Final   Calcium 04/11/2022 9.2  8.9 - 10.3 mg/dL Final   Total Protein 04/11/2022 6.9  6.5 - 8.1 g/dL Final   Albumin 04/11/2022 3.9  3.5 - 5.0 g/dL Final   AST 04/11/2022 33  15 - 41 U/L Final   ALT 04/11/2022 47 (H)  0 - 44 U/L Final   Alkaline Phosphatase 04/11/2022 69  38 - 126 U/L Final   Total Bilirubin 04/11/2022 0.5  0.3 - 1.2 mg/dL Final   GFR, Estimated 04/11/2022 >60  >60 mL/min Final   Comment: (NOTE) Calculated using the CKD-EPI Creatinine Equation (2021)  Anion gap 04/11/2022 10  5 - 15 Final   Performed at [place] [address], [place], [place] [address]   POC Amphetamine UR 04/11/2022 None Detected  NONE DETECTED (Cut Off Level 1000 ng/mL) Final   POC Secobarbital (BAR) 04/11/2022 None Detected  NONE DETECTED (Cut Off Level 300 ng/mL) Final   POC Buprenorphine (BUP) 04/11/2022 None Detected  NONE DETECTED (Cut Off Level 10 ng/mL) Final   POC Oxazepam (BZO) 04/11/2022 None Detected  NONE DETECTED (Cut Off Level 300 ng/mL) Final   POC Cocaine UR 04/11/2022 None Detected  NONE DETECTED (Cut Off Level 300 ng/mL) Final   POC Methamphetamine UR 04/11/2022 None Detected  NONE DETECTED (Cut Off Level 1000 ng/mL) Final   POC Morphine 04/11/2022 None Detected  NONE DETECTED (Cut Off Level 300 ng/mL) Final   POC Methadone UR 04/11/2022 None Detected  NONE DETECTED (Cut Off Level 300 ng/mL) Final   POC Oxycodone UR 04/11/2022 None Detected  NONE DETECTED (Cut Off Level 100 ng/mL) Final   POC Marijuana UR 04/11/2022 None Detected  NONE DETECTED (Cut Off Level 50 ng/mL) Final   Admission on 03/30/2022, Discharged on 04/01/2022  Component Date Value Ref Range Status   SARS Coronavirus 2 by RT PCR 03/30/2022 NEGATIVE  NEGATIVE Final   Comment: (NOTE) SARS-CoV-2 target nucleic acids are NOT DETECTED.  The SARS-CoV-2 RNA is generally detectable in upper respiratory specimens during the acute phase of infection. The lowest concentration of SARS-CoV-2 viral copies this assay can detect is 138 copies/mL. A negative result does not preclude SARS-Cov-2 infection and should not be used as the sole basis for treatment or other patient management decisions. A negative result may occur with  improper specimen collection/handling, submission of specimen other than nasopharyngeal swab, presence of viral mutation(s) within the areas targeted by this assay, and inadequate number of viral copies(<138 copies/mL). A negative result must be combined with clinical observations, patient history, and epidemiological information. The expected result is Negative.  Fact Sheet for Patients:  EntrepreneurPulse.com.au  Fact Sheet for Healthcare Providers:  IncredibleEmployment.be  This test is no                          t yet approved or cleared by the [place] FDA and  has been authorized for detection and/or diagnosis of SARS-CoV-2 by FDA under an Emergency Use Authorization (EUA). This EUA will remain  in effect (meaning this test can be used) for the duration of the COVID-19 declaration under Section 564(b)(1) of the Act, 21 U.S.C.section 360bbb-3(b)(1), unless the authorization is terminated  or revoked sooner.       Influenza A by PCR 03/30/2022 NEGATIVE  NEGATIVE Final   Influenza B by PCR 03/30/2022 NEGATIVE  NEGATIVE Final   Comment: (NOTE) The Xpert Xpress SARS-CoV-2/FLU/RSV plus assay is intended as an aid in the diagnosis of influenza from Nasopharyngeal swab specimens and should not be used as a sole basis for treatment.  Nasal washings and aspirates are unacceptable for Xpert Xpress SARS-CoV-2/FLU/RSV testing.  Fact Sheet for Patients: EntrepreneurPulse.com.au  Fact Sheet for Healthcare Providers: IncredibleEmployment.be  This test is not yet approved or cleared by the [place] FDA and has been authorized for detection and/or diagnosis of SARS-CoV-2 by FDA under an Emergency Use Authorization (EUA). This EUA will remain in effect (meaning this test can be used) for the duration of the COVID-19 declaration under Section 564(b)(1) of the Act, 21 U.S.C. section 360bbb-3(b)(1),  unless the authorization is terminated or revoked.  Performed at [facility], [place] [address], [place], [place] [address]    POC Amphetamine UR 03/30/2022 Positive (A)  NONE DETECTED (Cut Off Level 1000 ng/mL) Final   POC Secobarbital (BAR) 03/30/2022 None Detected  NONE DETECTED (Cut Off Level 300 ng/mL) Final   POC Buprenorphine (BUP) 03/30/2022 None Detected  NONE DETECTED (Cut Off Level 10 ng/mL) Final   POC Oxazepam (BZO) 03/30/2022 None Detected  NONE DETECTED (Cut Off Level 300 ng/mL) Final   POC Cocaine UR 03/30/2022 None Detected  NONE DETECTED (Cut Off Level 300 ng/mL) Final   POC Methamphetamine UR 03/30/2022 Positive (A)  NONE DETECTED (Cut Off Level 1000 ng/mL) Final   POC Morphine 03/30/2022 None Detected  NONE DETECTED (Cut Off Level 300 ng/mL) Final   POC Methadone UR 03/30/2022 None Detected  NONE DETECTED (Cut Off Level 300 ng/mL) Final   POC Oxycodone UR 03/30/2022 None Detected  NONE DETECTED (Cut Off Level 100 ng/mL) Final   POC Marijuana UR 03/30/2022 Positive (A)  NONE DETECTED (Cut Off Level 50 ng/mL) Final   SARSCOV2ONAVIRUS 2 AG 03/30/2022 NEGATIVE  NEGATIVE Final   Comment: (NOTE) SARS-CoV-2 antigen NOT DETECTED.   Negative results are presumptive.  Negative results do not preclude SARS-CoV-2 infection and should not be used as the sole basis  for treatment or other patient management decisions, including infection  control decisions, particularly in the presence of clinical signs and  symptoms consistent with COVID-19, or in those who have been in contact with the virus.  Negative results must be combined with clinical observations, patient history, and epidemiological information. The expected result is Negative.  Fact Sheet for Patients: HandmadeRecipes.com.cy  Fact Sheet for Healthcare Providers: FuneralLife.at  This test is not yet approved or cleared by the [place] FDA and  has been authorized for detection and/or diagnosis of SARS-CoV-2 by FDA under an Emergency Use Authorization (EUA).  This EUA will remain in effect (meaning this test can be used) for the duration of  the COV                          ID-19 declaration under Section 564(b)(1) of the Act, 21 U.S.C. section 360bbb-3(b)(1), unless the authorization is terminated or revoked sooner.    Admission on 03/29/2022, Discharged on 03/30/2022  Component Date Value Ref Range Status   Sodium 03/29/2022 142  135 - 145 mmol/L Final   Potassium 03/29/2022 3.5  3.5 - 5.1 mmol/L Final   Chloride 03/29/2022 110  98 - 111 mmol/L Final   CO2 03/29/2022 24  22 - 32 mmol/L Final   Glucose, Bld 03/29/2022 108 (H)  70 - 99 mg/dL Final   Glucose reference range applies only to samples taken after fasting for at least 8 hours.   BUN 03/29/2022 13  6 - 20 mg/dL Final   Creatinine, Ser 03/29/2022 1.19  0.61 - 1.24 mg/dL Final   Calcium 03/29/2022 9.2  8.9 - 10.3 mg/dL Final   Total Protein 03/29/2022 7.7  6.5 - 8.1 g/dL Final   Albumin 03/29/2022 4.2  3.5 - 5.0 g/dL Final   AST 03/29/2022 61 (H)  15 - 41 U/L Final   ALT 03/29/2022 48 (H)  0 - 44 U/L Final   Alkaline Phosphatase 03/29/2022 64  38 - 126 U/L Final   Total Bilirubin 03/29/2022 0.9  0.3 - 1.2 mg/dL Final   GFR, Estimated 03/29/2022 >60  >  60 mL/min Final    Comment: (NOTE) Calculated using the CKD-EPI Creatinine Equation (2021)    Anion gap 03/29/2022 8  5 - 15 Final   Performed at [facility], [place] [address] [place], [place] [address]   Alcohol, Ethyl (B) 03/29/2022 <10  <10 mg/dL Final   Comment: (NOTE) Lowest detectable limit for serum alcohol is 10 mg/dL.  For medical purposes only. Performed at [facility], [place] [address], [place], [place] [address]    Salicylate Lvl 32/67/1245 <7.0 (L)  7.0 - 30.0 mg/dL Final   Performed at [place] [address] [place], [place] [address]   Acetaminophen (Tylenol), Serum 03/29/2022 <10 (L)  10 - 30 ug/mL Final   Comment: (NOTE) Therapeutic concentrations vary significantly. A range of 10-30 ug/mL  may be an effective concentration for many patients. However, some  are best treated at concentrations outside of this range. Acetaminophen concentrations >150 ug/mL at 4 hours after ingestion  and >50 ug/mL at 12 hours after ingestion are often associated with  toxic reactions.  Performed at [facility], [place] [address] [place], [place] [address]    WBC 03/29/2022 7.5  4.0 - 10.5 K/uL Final   RBC 03/29/2022 4.21 (L)  4.22 - 5.81 MIL/uL Final   Hemoglobin 03/29/2022 12.2 (L)  13.0 - 17.0 g/dL Final   HCT 03/29/2022 38.1 (L)  39.0 - 52.0 % Final   MCV 03/29/2022 90.5  80.0 - 100.0 fL Final   MCH 03/29/2022 29.0  26.0 - 34.0 pg Final   MCHC 03/29/2022 32.0  30.0 - 36.0 g/dL Final   RDW 03/29/2022 15.3  11.5 - 15.5 % Final   Platelets 03/29/2022 228  150 - 400 K/uL Final   nRBC 03/29/2022 0.0  0.0 - 0.2 % Final   Performed at [facility], [place] [address] [place], [place] [address]   Opiates 03/29/2022 NONE DETECTED  NONE DETECTED Final   Cocaine 03/29/2022 NONE DETECTED  NONE DETECTED Final   Benzodiazepines 03/29/2022 NONE DETECTED  NONE DETECTED Final   Amphetamines 03/29/2022 POSITIVE (A)   NONE DETECTED Final   Tetrahydrocannabinol 03/29/2022 POSITIVE (A)  NONE DETECTED Final   Barbiturates 03/29/2022 NONE DETECTED  NONE DETECTED Final   Comment: (NOTE) DRUG SCREEN FOR MEDICAL PURPOSES ONLY.  IF CONFIRMATION IS NEEDED FOR ANY PURPOSE, NOTIFY LAB WITHIN 5 DAYS.  LOWEST DETECTABLE LIMITS FOR URINE DRUG SCREEN Drug Class                     Cutoff (ng/mL) Amphetamine and metabolites    1000 Barbiturate and metabolites    200 Benzodiazepine                 673 Tricyclics and metabolites     300 Opiates and metabolites        300 Cocaine and metabolites        300 THC                            50 Performed at [facility] [place] [address], [place], [place] [address]    Troponin I (High Sensitivity) 03/29/2022 3  <18 ng/L Final   Comment: (NOTE) Elevated high sensitivity troponin I (hsTnI) values and significant  changes across serial measurements may suggest ACS but many other  chronic and acute conditions are known to elevate hsTnI results.  Refer to  the "Links" section for chest pain algorithms and additional  guidance. Performed at [facility], [place] [address] [place], [place] [address]    SARS Coronavirus 2 by RT PCR 03/29/2022 NEGATIVE  NEGATIVE Final   Comment: (NOTE) SARS-CoV-2 target nucleic acids are NOT DETECTED.  The SARS-CoV-2 RNA is generally detectable in upper and lower respiratory specimens during the acute phase of infection. The lowest concentration of SARS-CoV-2 viral copies this assay can detect is 250 copies / mL. A negative result does not preclude SARS-CoV-2 infection and should not be used as the sole basis for treatment or other patient management decisions.  A negative result may occur with improper specimen collection / handling, submission of specimen other than nasopharyngeal swab, presence of viral mutation(s) within the areas targeted by this assay, and inadequate number of viral copies (<250  copies / mL). A negative result must be combined with clinical observations, patient history, and epidemiological information.  Fact Sheet for Patients:   https://www.patel.info/  Fact Sheet for Healthcare Providers: https://hall.com/  This test is not yet approved or                           cleared by the [place] FDA and has been authorized for detection and/or diagnosis of SARS-CoV-2 by FDA under an Emergency Use Authorization (EUA).  This EUA will remain in effect (meaning this test can be used) for the duration of the COVID-19 declaration under Section 564(b)(1) of the Act, 21 U.S.C. section 360bbb-3(b)(1), unless the authorization is terminated or revoked sooner.  Performed at [facility], [place] [address], [place], [place] [address]    SARS Coronavirus 2 by RT PCR 03/30/2022 NEGATIVE  NEGATIVE Final   Comment: (NOTE) SARS-CoV-2 target nucleic acids are NOT DETECTED.  The SARS-CoV-2 RNA is generally detectable in upper respiratory specimens during the acute phase of infection. The lowest concentration of SARS-CoV-2 viral copies this assay can detect is 138 copies/mL. A negative result does not preclude SARS-Cov-2 infection and should not be used as the sole basis for treatment or other patient management decisions. A negative result may occur with  improper specimen collection/handling, submission of specimen other than nasopharyngeal swab, presence of viral mutation(s) within the areas targeted by this assay, and inadequate number of viral copies(<138 copies/mL). A negative result must be combined with clinical observations, patient history, and epidemiological information. The expected result is Negative.  Fact Sheet for Patients:  EntrepreneurPulse.com.au  Fact Sheet for Healthcare Providers:  IncredibleEmployment.be  This test is no                          t  yet approved or cleared by the [place] FDA and  has been authorized for detection and/or diagnosis of SARS-CoV-2 by FDA under an Emergency Use Authorization (EUA). This EUA will remain  in effect (meaning this test can be used) for the duration of the COVID-19 declaration under Section 564(b)(1) of the Act, 21 U.S.C.section 360bbb-3(b)(1), unless the authorization is terminated  or revoked sooner.       Influenza A by PCR 03/30/2022 NEGATIVE  NEGATIVE Final   Influenza B by PCR 03/30/2022 NEGATIVE  NEGATIVE Final   Comment: (NOTE) The Xpert Xpress SARS-CoV-2/FLU/RSV plus assay is intended as an aid in the diagnosis of influenza from Nasopharyngeal swab specimens and should not be used as a sole basis for treatment. Nasal washings and aspirates  are unacceptable for Xpert Xpress SARS-CoV-2/FLU/RSV testing.  Fact Sheet for Patients: EntrepreneurPulse.com.au  Fact Sheet for Healthcare Providers: IncredibleEmployment.be  This test is not yet approved or cleared by the [place] FDA and has been authorized for detection and/or diagnosis of SARS-CoV-2 by FDA under an Emergency Use Authorization (EUA). This EUA will remain in effect (meaning this test can be used) for the duration of the COVID-19 declaration under Section 564(b)(1) of the Act, 21 U.S.C. section 360bbb-3(b)(1), unless the authorization is terminated or revoked.  Performed at [facility], [place] [address], [place], [place] [address]     Allergies: Fish allergy and Shellfish allergy  PTA Medications: (Not in a hospital admission)  Prior to Admission medications   Medication Sig Start Date End Date Taking? Authorizing Provider  atorvastatin (LIPITOR) 40 MG tablet Take 40 mg by mouth daily. 07/10/22   [provider]  buPROPion (WELLBUTRIN XL) 300 MG 24 hr tablet Take 300 mg by mouth daily.    [provider]  buPROPion (WELLBUTRIN XL)  300 MG 24 hr tablet Take 1 tablet (300 mg total) by mouth daily for 7 days. 08/03/22 08/10/22  Leanord Asal K, DO  gabapentin (NEURONTIN) 100 MG capsule Take 2 capsules (200 mg total) by mouth 3 (three) times daily. Patient not taking: Reported on 08/02/2022 07/07/22 08/06/22  Merrily Brittle, DO  gabapentin (NEURONTIN) 100 MG capsule Take 1 capsule (100 mg total) by mouth 2 (two) times daily for 7 days. 08/03/22 08/10/22  Leanord Asal K, DO  gabapentin (NEURONTIN) 300 MG capsule Take 300 mg by mouth 3 (three) times daily.    [provider]  hydrOXYzine (ATARAX) 25 MG tablet Take 1 tablet (25 mg total) by mouth 3 (three) times daily as needed for anxiety. 05/06/22   Mayers, Cari S, PA-C  isosorbide mononitrate (IMDUR) 30 MG 24 hr tablet Take 0.5 tablets (15 mg total) by mouth daily. 05/06/22   Mayers, Cari S, PA-C  LIPITOR 80 MG tablet Take 1 tablet (80 mg total) by mouth at bedtime. Patient not taking: Reported on 08/02/2022 05/06/22   Mayers, Cari S, PA-C  QUEtiapine (SEROQUEL) 100 MG tablet Take 1 tablet (100 mg total) by mouth at bedtime. Patient not taking: Reported on 08/02/2022 07/07/22 08/06/22  Merrily Brittle, DO  QUEtiapine (SEROQUEL) 200 MG tablet Take 200 mg by mouth at bedtime.    [provider]  FLUoxetine (PROZAC) 20 MG capsule Take 1 capsule (20 mg total) by mouth daily. For depression Patient not taking: Reported on 11/04/2019 04/01/18 05/04/20  Lindell Spar I, NP  sucralfate (CARAFATE) 1 g tablet Take 1 tablet (1 g total) by mouth 4 (four) times daily -  with meals and at bedtime. Patient not taking: Reported on 11/04/2019 08/30/19 05/04/20  Lorayne Bender, PA-C    Medical Decision Making  Recommend overnight admission to the continuous observation unit overnight for safety monitoring and substance abuse treatment/detox.  Patient might be appropriate for the [facility].  BAC normal.  History of alcohol withdrawal seizures 5 years ago.  UDS positive for methamphetamine and THC.   Re-eval for appropriate available treatment options in the a.m.   Lab Orders         Resp Panel by RT-PCR (Flu A&B, Covid) Anterior Nasal Swab         CBC with Differential/Platelet         Comprehensive metabolic panel         Hemoglobin A1c  Ethanol         Lipid panel         TSH         POCT Urine Drug Screen - (I-Screen)         POC SARS Coronavirus 2 Ag      PRNs -Tylenol 650 mg p.o. every 6 hours as needed pain -Mom 30 mL p.o. daily as needed constipation -Maalox 30 mL p.o. every 4 hours as needed indigestion  Prn Clonidine detox protocol meds -hydroxyzine 25 mg PO every 6 hours prn for anxiety -ondansetron 4 mg ODT every 6 hours prn nausea/vomiting -dicyclomine 20 mg PO every 6 hours prn abdominal cramping -loperamide 2-4 mg capsule prn diarrhea -methocarbamol 500 mg PO every 8 hours prn spasms     Recommendations  Based on my evaluation the patient does not appear to have an emergency medical condition.  Recommend admission to the continuous observation unit overnight.   Randon Goldsmith, NP 08/09/22  1:56 AM

## 2022-08-09 NOTE — ED Notes (Signed)
Snacks given 

## 2022-08-10 DIAGNOSIS — F1120 Opioid dependence, uncomplicated: Secondary | ICD-10-CM | POA: Diagnosis not present

## 2022-08-10 DIAGNOSIS — F1920 Other psychoactive substance dependence, uncomplicated: Secondary | ICD-10-CM | POA: Diagnosis not present

## 2022-08-10 MED ORDER — LORAZEPAM 2 MG/ML IJ SOLN: 1.0000 mg | Freq: Four times a day (QID) | INTRAMUSCULAR | Status: DC | PRN

## 2022-08-10 MED ORDER — LORAZEPAM 2 MG/ML IJ SOLN: 1.0000 mg | Freq: Four times a day (QID) | INTRAMUSCULAR | Status: DC

## 2022-08-10 MED ORDER — LORAZEPAM 1 MG PO TABS: 1.0000 mg | ORAL_TABLET | Freq: Four times a day (QID) | ORAL | Status: DC | PRN

## 2022-08-10 MED ORDER — QUETIAPINE FUMARATE 100 MG PO TABS
100.0000 mg | ORAL_TABLET | Freq: Every day | ORAL | Status: DC
  Administered 2022-08-10: 100 mg via ORAL
  Filled 2022-08-10: qty 1

## 2022-08-10 MED ORDER — THIAMINE MONONITRATE 100 MG PO TABS
100.0000 mg | ORAL_TABLET | Freq: Every day | ORAL | Status: DC
  Administered 2022-08-10 – 2022-08-13 (×4): 100 mg via ORAL
  Filled 2022-08-10 (×4): qty 1

## 2022-08-10 MED ORDER — LORAZEPAM 1 MG PO TABS: 1.0000 mg | ORAL_TABLET | Freq: Four times a day (QID) | ORAL | Status: DC

## 2022-08-10 NOTE — ED Notes (Signed)
Snacks given 

## 2022-08-10 NOTE — ED Notes (Signed)
Pt sitting in dining room watching TV. A&O x4, calm and cooperative. Denies current SI/HI/AVH. No signs of distress noted. Monitoring for safety.  

## 2022-08-10 NOTE — ED Notes (Signed)
Patient awake for breakfast.  He is guarded and anxious.  He has difficulty sitting still and has multiple requests which staff attempt to meet.  Patient is calm however and relatively pleasant.  Patient is clearly struggling with his on going substance use issues and appears conflicted.  Patient is open to discussing ways to avoid use although he will also find ways to make excuses for using.  H e denies avh shi or plan.  Will monitor.

## 2022-08-10 NOTE — ED Notes (Signed)
Pt up to nurse's station requesting Robaxin and anxiety medication. PRNs administered per [facility] without difficulty. No signs of distress noted. Monitoring for safety.

## 2022-08-10 NOTE — ED Provider Notes (Addendum)
Behavioral Health Progress Note  Date and Time: 08/10/2022 12:01 PM Name: Brian [name] MRN:  [id number]  Subjective:   Brian [name] is a 36 yr old male who presented to [facility] on 10/14 and was accepted to [facility] for Residential Treatment for substance abuse.  PPHx is significant for Bipolar Disorder, GAD, Substance Induced Mood Disorder, and Polysubstance Abuse (EtOH, Opioids, Cocaine, Amphetamines, THC).  He has been to Residential Treatment prior and has a history of Withdrawal Seizures.  He reports that he is having withdrawal symptoms today.  He reports he is having some shakes, nausea, total body aches, runny nose, and some sweats/chills.  He reports that they are manageable at this point.  He had questions about why his medications.  Discussed tapering and.  He reports no SI, HI, or AVH.  He reports he slept good last night.  He reports his appetite is doing good.  He reports no other concerns at present.  Diagnosis:  Final diagnoses:  Polysubstance dependence including opioid type drug, episodic abuse ([place])  Housing instability after recent homelessness    Total Time spent with patient: 20 minutes  Past Psychiatric History: Bipolar Disorder, GAD, Substance Induced Mood Disorder, and Polysubstance Abuse (EtOH, Opioids, Cocaine, Amphetamines, THC).  He has been to Residential Treatment prior and has a history of Withdrawal Seizures. 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 Collene Mares about sedation type/timinng - I recommend MAC   HAND SURGERY  2006   LEFT HEART CATH AND CORONARY ANGIOGRAPHY N/A 06/21/2020   Procedure: LEFT HEART CATH AND CORONARY ANGIOGRAPHY;  Surgeon: Nelva Bush, MD;  Location: [facility];  Service: Cardiovascular;  Laterality: N/A;   Family History:  Family History  Problem Relation Age of Onset   Heart disease Father    Family Psychiatric  History: Reports None Social History:  Social History   Substance and Sexual Activity  Alcohol Use Not Currently     Social History   Substance and Sexual Activity  Drug Use Yes   Types: IV, Methamphetamines    Social History   Socioeconomic History   Marital status: Single    Spouse name: Not on file   Number of children: Not on file   Years of education: Not on file   Highest education level: Not on file  Occupational History   Not on file  Tobacco Use   Smoking status: Every Day    Packs/day: 0.50    Types: Cigarettes   Smokeless tobacco: Never  Vaping Use   Vaping Use: Never used  Substance and Sexual Activity   Alcohol use: Not Currently   Drug use: Yes    Types: IV, Methamphetamines   Sexual activity: Not Currently  Other Topics Concern   Not on file  Social History Narrative   ** Merged History Encounter **  Lives with a friend   Lost house and custody of son 9broke up with fiancee)   Nature conservation officer   Social Determinants of Health   Financial Resource Strain: Not on file  Food Insecurity: Not on file  Transportation Needs: Not on file  Physical Activity: Not on file  Stress: Not on file  Social Connections: Not on file   SDOH:  [place]: High Risk (08/09/2020)  Alcohol Screen: Low Risk  (12/21/2020)  Depression (PHQ2-9): High Risk (08/09/2022)  Tobacco Use: High Risk (08/01/2022)   Additional Social History:    Pain Medications: See MAR Prescriptions:  See MAR Over the Counter: See MAR History of alcohol / drug use?: Yes Longest period of sobriety (when/how long): Pt reports, three years. Name of Substance 1: Methamphetamines.  Pt thinks it could have been cut with Fentanyl. 1 - Age of First Use: UTA 1 - Amount (size/oz): Pt reports, using about $20 worth this morning. 1 - Frequency: Per pt, almost everyday. 1 - Duration: Ongoing. 1 - Last Use / Amount: 08/08/2022. 1 - Method of Aquiring: Purchase. 1- Route of Use: UTA Name of Substance 2: Crack Cocaine. Pt thinks it could have been cut with Fentanyl. 2 - Age of First Use: UTA 2 - Amount (size/oz): Pt reports, using a gram or two a few days ago. 2 - Frequency: Ongoing. 2 - Duration: Ongoing. 2 - Last Use / Amount: A few days ago. 2 - Method of Aquiring: Purchase. 2 - Route of Substance Use: UTA                Sleep: Good  Appetite:  Good  Current Medications:  Current Facility-Administered Medications  Medication Dose Route Frequency Provider Last Rate Last Admin   acetaminophen (TYLENOL) tablet 650 mg  650 mg Oral Q6H PRN Onuoha, Chinwendu V, NP   650 mg at 08/09/22 0255   alum & mag hydroxide-simeth (MAALOX/MYLANTA) 200-200-20 MG/5ML suspension 30 mL  30 mL Oral Q4H PRN Onuoha, Chinwendu V, NP       atorvastatin (LIPITOR) tablet 20 mg  20 mg Oral Daily Derrill Center, NP   20 mg at 08/10/22 7672   buPROPion (WELLBUTRIN XL) 24 hr tablet 150 mg  150 mg Oral Daily Derrill Center, NP   150 mg at 08/10/22 0947   dicyclomine (BENTYL) tablet 20 mg  20 mg Oral Q6H PRN Onuoha, Chinwendu V, NP       gabapentin (NEURONTIN) tablet 300 mg  300 mg Oral BID Derrill Center, NP   300 mg at 08/10/22 0962   hydrOXYzine (ATARAX) tablet 25 mg  25 mg Oral Q6H PRN Onuoha, Chinwendu V, NP   25 mg at 08/09/22 2136   loperamide (IMODIUM) capsule 2-4 mg  2-4 mg Oral PRN Onuoha, Chinwendu V, NP       LORazepam (ATIVAN) tablet 1 mg  1 mg Oral Q6H PRN Rehaan Viloria, Redgie Grayer, MD       Or    LORazepam (ATIVAN) injection 1 mg  1 mg Intramuscular Q6H PRN [name], MD       magnesium hydroxide (MILK OF MAGNESIA) suspension 30 mL  30 mL Oral Daily PRN Onuoha, Chinwendu V, NP       methocarbamol (ROBAXIN) tablet 500 mg  500 mg Oral Q8H PRN Onuoha, Chinwendu V, NP   500 mg at 08/09/22 2136   neomycin-bacitracin-polymyxin (NEOSPORIN) ointment   Topical PRN Malachy Mood, PA  ondansetron (ZOFRAN-ODT) disintegrating tablet 4 mg  4 mg Oral Q6H PRN Onuoha, Chinwendu V, NP       QUEtiapine (SEROQUEL) tablet 100 mg  100 mg Oral QHS Shontae Rosiles, Redgie Grayer, MD       thiamine (VITAMIN B1) tablet 100 mg  100 mg Oral Daily [name], MD   100 mg at 08/10/22 1914   Current Outpatient Medications  Medication Sig Dispense Refill   buPROPion (WELLBUTRIN XL) 300 MG 24 hr tablet Take 1 tablet (300 mg total) by mouth daily for 7 days. 7 tablet 0   gabapentin (NEURONTIN) 300 MG capsule Take 300 mg by mouth 3 (three) times daily.     hydrOXYzine (ATARAX) 25 MG tablet Take 1 tablet (25 mg total) by mouth 3 (three) times daily as needed for anxiety. 90 tablet 1   LIPITOR 80 MG tablet Take 1 tablet (80 mg total) by mouth at bedtime. 30 tablet 1   QUEtiapine (SEROQUEL) 200 MG tablet Take 200 mg by mouth at bedtime.      Labs  Lab Results:  Admission on 08/09/2022  Component Date Value Ref Range Status   SARS Coronavirus 2 by RT PCR 08/09/2022 NEGATIVE  NEGATIVE Final   Comment: (NOTE) SARS-CoV-2 target nucleic acids are NOT DETECTED.  The SARS-CoV-2 RNA is generally detectable in upper respiratory specimens during the acute phase of infection. The lowest concentration of SARS-CoV-2 viral copies this assay can detect is 138 copies/mL. A negative result does not preclude SARS-Cov-2 infection and should not be used as the sole basis for treatment or other patient management decisions. A negative result may occur with  improper specimen collection/handling, submission of  specimen other than nasopharyngeal swab, presence of viral mutation(s) within the areas targeted by this assay, and inadequate number of viral copies(<138 copies/mL). A negative result must be combined with clinical observations, patient history, and epidemiological information. The expected result is Negative.  Fact Sheet for Patients:  EntrepreneurPulse.com.au  Fact Sheet for Healthcare Providers:  IncredibleEmployment.be  This test is no                          t yet approved or cleared by the [place] FDA and  has been authorized for detection and/or diagnosis of SARS-CoV-2 by FDA under an Emergency Use Authorization (EUA). This EUA will remain  in effect (meaning this test can be used) for the duration of the COVID-19 declaration under Section 564(b)(1) of the Act, 21 U.S.C.section 360bbb-3(b)(1), unless the authorization is terminated  or revoked sooner.       Influenza A by PCR 08/09/2022 NEGATIVE  NEGATIVE Final   Influenza B by PCR 08/09/2022 NEGATIVE  NEGATIVE Final   Comment: (NOTE) The Xpert Xpress SARS-CoV-2/FLU/RSV plus assay is intended as an aid in the diagnosis of influenza from Nasopharyngeal swab specimens and should not be used as a sole basis for treatment. Nasal washings and aspirates are unacceptable for Xpert Xpress SARS-CoV-2/FLU/RSV testing.  Fact Sheet for Patients: EntrepreneurPulse.com.au  Fact Sheet for Healthcare Providers: IncredibleEmployment.be  This test is not yet approved or cleared by the [place] FDA and has been authorized for detection and/or diagnosis of SARS-CoV-2 by FDA under an Emergency Use Authorization (EUA). This EUA will remain in effect (meaning this test can be used) for the duration of the COVID-19 declaration under Section 564(b)(1) of the Act, 21 U.S.C. section 360bbb-3(b)(1), unless the authorization is terminated  or revoked.  Performed at [facility]  [facility], [place] [address], [place], [place] [address]    WBC 08/09/2022 13.1 (H)  4.0 - 10.5 K/uL Final   RBC 08/09/2022 4.69  4.22 - 5.81 MIL/uL Final   Hemoglobin 08/09/2022 14.1  13.0 - 17.0 g/dL Final   HCT 08/09/2022 43.1  39.0 - 52.0 % Final   MCV 08/09/2022 91.9  80.0 - 100.0 fL Final   MCH 08/09/2022 30.1  26.0 - 34.0 pg Final   MCHC 08/09/2022 32.7  30.0 - 36.0 g/dL Final   RDW 08/09/2022 15.6 (H)  11.5 - 15.5 % Final   Platelets 08/09/2022 299  150 - 400 K/uL Final   nRBC 08/09/2022 0.0  0.0 - 0.2 % Final   Neutrophils Relative % 08/09/2022 79  % Final   Neutro Abs 08/09/2022 10.3 (H)  1.7 - 7.7 K/uL Final   Lymphocytes Relative 08/09/2022 12  % Final   Lymphs Abs 08/09/2022 1.6  0.7 - 4.0 K/uL Final   Monocytes Relative 08/09/2022 7  % Final   Monocytes Absolute 08/09/2022 1.0  0.1 - 1.0 K/uL Final   Eosinophils Relative 08/09/2022 1  % Final   Eosinophils Absolute 08/09/2022 0.1  0.0 - 0.5 K/uL Final   Basophils Relative 08/09/2022 0  % Final   Basophils Absolute 08/09/2022 0.0  0.0 - 0.1 K/uL Final   Immature Granulocytes 08/09/2022 1  % Final   Abs Immature Granulocytes 08/09/2022 0.07  0.00 - 0.07 K/uL Final   Performed at [facility], [place] [address] [place], [place] [address]   Sodium 08/09/2022 136  135 - 145 mmol/L Final   Potassium 08/09/2022 4.1  3.5 - 5.1 mmol/L Final   Chloride 08/09/2022 102  98 - 111 mmol/L Final   CO2 08/09/2022 26  22 - 32 mmol/L Final   Glucose, Bld 08/09/2022 101 (H)  70 - 99 mg/dL Final   Glucose reference range applies only to samples taken after fasting for at least 8 hours.   BUN 08/09/2022 11  6 - 20 mg/dL Final   Creatinine, Ser 08/09/2022 1.26 (H)  0.61 - 1.24 mg/dL Final   Calcium 08/09/2022 9.5  8.9 - 10.3 mg/dL Final   Total Protein 08/09/2022 7.5  6.5 - 8.1 g/dL Final   Albumin 08/09/2022 4.0  3.5 - 5.0 g/dL Final   AST 08/09/2022 23  15 - 41 U/L Final   ALT 08/09/2022 23  0 -  44 U/L Final   Alkaline Phosphatase 08/09/2022 65  38 - 126 U/L Final   Total Bilirubin 08/09/2022 0.4  0.3 - 1.2 mg/dL Final   GFR, Estimated 08/09/2022 >60  >60 mL/min Final   Comment: (NOTE) Calculated using the CKD-EPI Creatinine Equation (2021)    Anion gap 08/09/2022 8  5 - 15 Final   Performed at [facility] [address] [place], [place] [address]   Hgb A1c MFr Bld 08/09/2022 5.7 (H)  4.8 - 5.6 % Final   Comment: (NOTE) Pre diabetes:          5.7%-6.4%  Diabetes:              >6.4%  Glycemic control for   <7.0% adults with diabetes    Mean Plasma Glucose 08/09/2022 116.89  mg/dL Final   Performed at [facility], [place] [address] [place], [place] [address]   Alcohol, Ethyl (B) 08/09/2022 <10  <10 mg/dL Final   Comment: (NOTE) Lowest detectable limit for serum alcohol is 10  mg/dL.  For medical purposes only. Performed at [facility], [place] [address] [place], [place] [address]    POC Amphetamine UR 08/09/2022 None Detected  NONE DETECTED (Cut Off Level 1000 ng/mL) Preliminary   POC Secobarbital (BAR) 08/09/2022 None Detected  NONE DETECTED (Cut Off Level 300 ng/mL) Preliminary   POC Buprenorphine (BUP) 08/09/2022 None Detected  NONE DETECTED (Cut Off Level 10 ng/mL) Preliminary   POC Oxazepam (BZO) 08/09/2022 None Detected  NONE DETECTED (Cut Off Level 300 ng/mL) Preliminary   POC Cocaine UR 08/09/2022 None Detected  NONE DETECTED (Cut Off Level 300 ng/mL) Preliminary   POC Methamphetamine UR 08/09/2022 Positive (A)  NONE DETECTED (Cut Off Level 1000 ng/mL) Preliminary   POC Morphine 08/09/2022 None Detected  NONE DETECTED (Cut Off Level 300 ng/mL) Preliminary   POC Methadone UR 08/09/2022 None Detected  NONE DETECTED (Cut Off Level 300 ng/mL) Preliminary   POC Oxycodone UR 08/09/2022 None Detected  NONE DETECTED (Cut Off Level 100 ng/mL) Preliminary   POC Marijuana UR 08/09/2022 Positive (A)  NONE DETECTED (Cut Off Level 50 ng/mL) Preliminary    SARSCOV2ONAVIRUS 2 AG 08/09/2022 NEGATIVE  NEGATIVE Final   Comment: (NOTE) SARS-CoV-2 antigen NOT DETECTED.   Negative results are presumptive.  Negative results do not preclude SARS-CoV-2 infection and should not be used as the sole basis for treatment or other patient management decisions, including infection  control decisions, particularly in the presence of clinical signs and  symptoms consistent with COVID-19, or in those who have been in contact with the virus.  Negative results must be combined with clinical observations, patient history, and epidemiological information. The expected result is Negative.  Fact Sheet for Patients: HandmadeRecipes.com.cy  Fact Sheet for Healthcare Providers: FuneralLife.at  This test is not yet approved or cleared by the [place] FDA and  has been authorized for detection and/or diagnosis of SARS-CoV-2 by FDA under an Emergency Use Authorization (EUA).  This EUA will remain in effect (meaning this test can be used) for the duration of  the COV                          ID-19 declaration under Section 564(b)(1) of the Act, 21 U.S.C. section 360bbb-3(b)(1), unless the authorization is terminated or revoked sooner.     Cholesterol 08/09/2022 139  0 - 200 mg/dL Final   Triglycerides 08/09/2022 33  <150 mg/dL Final   HDL 08/09/2022 61  >40 mg/dL Final   Total CHOL/HDL Ratio 08/09/2022 2.3  RATIO Final   VLDL 08/09/2022 7  0 - 40 mg/dL Final   LDL Cholesterol 08/09/2022 71  0 - 99 mg/dL Final   Comment:        Total Cholesterol/HDL:CHD Risk Coronary Heart Disease Risk Table                     Men   Women  1/2 Average Risk   3.4   3.3  Average Risk       5.0   4.4  2 X Average Risk   9.6   7.1  3 X Average Risk  23.4   11.0        Use the calculated Patient Ratio above and the CHD Risk Table to determine the patient's CHD Risk.        ATP III CLASSIFICATION (LDL):  <100     mg/dL    Optimal  100-129  mg/dL   Near  or Above                    Optimal  130-159  mg/dL   Borderline  160-189  mg/dL   High  >190     mg/dL   Very High Performed at [facility] [address], [place], [place] [address]    TSH 08/09/2022 2.293  0.350 - 4.500 uIU/mL Final   Comment: Performed by a 3rd Generation assay with a functional sensitivity of <=0.01 uIU/mL. Performed at [facility], [place] [address], [place], [place] [address]   Admission on 08/01/2022, Discharged on 08/03/2022  Component Date Value Ref Range Status   Sodium 08/01/2022 142  135 - 145 mmol/L Final   Potassium 08/01/2022 3.8  3.5 - 5.1 mmol/L Final   Chloride 08/01/2022 111  98 - 111 mmol/L Final   CO2 08/01/2022 22  22 - 32 mmol/L Final   Glucose, Bld 08/01/2022 94  70 - 99 mg/dL Final   Glucose reference range applies only to samples taken after fasting for at least 8 hours.   BUN 08/01/2022 12  6 - 20 mg/dL Final   Creatinine, Ser 08/01/2022 1.50 (H)  0.61 - 1.24 mg/dL Final   Calcium 08/01/2022 8.9  8.9 - 10.3 mg/dL Final   Total Protein 08/01/2022 6.9  6.5 - 8.1 g/dL Final   Albumin 08/01/2022 3.8  3.5 - 5.0 g/dL Final   AST 08/01/2022 38  15 - 41 U/L Final   ALT 08/01/2022 29  0 - 44 U/L Final   Alkaline Phosphatase 08/01/2022 64  38 - 126 U/L Final   Total Bilirubin 08/01/2022 0.7  0.3 - 1.2 mg/dL Final   GFR, Estimated 08/01/2022 >60  >60 mL/min Final   Comment: (NOTE) Calculated using the CKD-EPI Creatinine Equation (2021)    Anion gap 08/01/2022 9  5 - 15 Final   Performed at [facility] [address], [place], [place] [address]   Alcohol, Ethyl (B) 08/01/2022 <10  <10 mg/dL Final   Comment: (NOTE) Lowest detectable limit for serum alcohol is 10 mg/dL.  For medical purposes only. Performed at [facility], [place] [address] [place], [place] [address]    Opiates 08/01/2022 NONE DETECTED  NONE DETECTED Final   Cocaine 08/01/2022 POSITIVE (A)  NONE DETECTED Final    Benzodiazepines 08/01/2022 NONE DETECTED  NONE DETECTED Final   Amphetamines 08/01/2022 POSITIVE (A)  NONE DETECTED Final   Tetrahydrocannabinol 08/01/2022 POSITIVE (A)  NONE DETECTED Final   Barbiturates 08/01/2022 NONE DETECTED  NONE DETECTED Final   Comment: (NOTE) DRUG SCREEN FOR MEDICAL PURPOSES ONLY.  IF CONFIRMATION IS NEEDED FOR ANY PURPOSE, NOTIFY LAB WITHIN 5 DAYS.  LOWEST DETECTABLE LIMITS FOR URINE DRUG SCREEN Drug Class                     Cutoff (ng/mL) Amphetamine and metabolites    1000 Barbiturate and metabolites    200 Benzodiazepine                 585 Tricyclics and metabolites     300 Opiates and metabolites        300 Cocaine and metabolites        300 THC                            50 Performed at [facility], [place]  [address], [place], [place] [address]    WBC 08/01/2022 11.3 (H)  4.0 - 10.5 K/uL Final   RBC 08/01/2022 4.16 (L)  4.22 - 5.81 MIL/uL Final   Hemoglobin 08/01/2022 12.5 (L)  13.0 - 17.0 g/dL Final   HCT 08/01/2022 38.4 (L)  39.0 - 52.0 % Final   MCV 08/01/2022 92.3  80.0 - 100.0 fL Final   MCH 08/01/2022 30.0  26.0 - 34.0 pg Final   MCHC 08/01/2022 32.6  30.0 - 36.0 g/dL Final   RDW 08/01/2022 15.2  11.5 - 15.5 % Final   Platelets 08/01/2022 247  150 - 400 K/uL Final   nRBC 08/01/2022 0.0  0.0 - 0.2 % Final   Neutrophils Relative % 08/01/2022 78  % Final   Neutro Abs 08/01/2022 8.7 (H)  1.7 - 7.7 K/uL Final   Lymphocytes Relative 08/01/2022 10  % Final   Lymphs Abs 08/01/2022 1.2  0.7 - 4.0 K/uL Final   Monocytes Relative 08/01/2022 10  % Final   Monocytes Absolute 08/01/2022 1.1 (H)  0.1 - 1.0 K/uL Final   Eosinophils Relative 08/01/2022 2  % Final   Eosinophils Absolute 08/01/2022 0.2  0.0 - 0.5 K/uL Final   Basophils Relative 08/01/2022 0  % Final   Basophils Absolute 08/01/2022 0.1  0.0 - 0.1 K/uL Final   Immature Granulocytes 08/01/2022 0  % Final   Abs Immature Granulocytes 08/01/2022 0.03  0.00 - 0.07 K/uL Final   Performed  at [facility], [place] [address] [place], [place] [address]  Admission on 07/06/2022, Discharged on 07/07/2022  Component Date Value Ref Range Status   SARS Coronavirus 2 by RT PCR 07/06/2022 NEGATIVE  NEGATIVE Final   Comment: (NOTE) SARS-CoV-2 target nucleic acids are NOT DETECTED.  The SARS-CoV-2 RNA is generally detectable in upper respiratory specimens during the acute phase of infection. The lowest concentration of SARS-CoV-2 viral copies this assay can detect is 138 copies/mL. A negative result does not preclude SARS-Cov-2 infection and should not be used as the sole basis for treatment or other patient management decisions. A negative result may occur with  improper specimen collection/handling, submission of specimen other than nasopharyngeal swab, presence of viral mutation(s) within the areas targeted by this assay, and inadequate number of viral copies(<138 copies/mL). A negative result must be combined with clinical observations, patient history, and epidemiological information. The expected result is Negative.  Fact Sheet for Patients:  EntrepreneurPulse.com.au  Fact Sheet for Healthcare Providers:  IncredibleEmployment.be  This test is no                          t yet approved or cleared by the [place] FDA and  has been authorized for detection and/or diagnosis of SARS-CoV-2 by FDA under an Emergency Use Authorization (EUA). This EUA will remain  in effect (meaning this test can be used) for the duration of the COVID-19 declaration under Section 564(b)(1) of the Act, 21 U.S.C.section 360bbb-3(b)(1), unless the authorization is terminated  or revoked sooner.       Influenza A by PCR 07/06/2022 NEGATIVE  NEGATIVE Final   Influenza B by PCR 07/06/2022 NEGATIVE  NEGATIVE Final   Comment: (NOTE) The Xpert Xpress SARS-CoV-2/FLU/RSV plus assay is intended as an aid in the diagnosis of influenza from Nasopharyngeal swab  specimens and should not be used as a sole basis for treatment. Nasal washings and aspirates are unacceptable for Xpert Xpress SARS-CoV-2/FLU/RSV testing.  Fact Sheet  for Patients: EntrepreneurPulse.com.au  Fact Sheet for Healthcare Providers: IncredibleEmployment.be  This test is not yet approved or cleared by the [place] FDA and has been authorized for detection and/or diagnosis of SARS-CoV-2 by FDA under an Emergency Use Authorization (EUA). This EUA will remain in effect (meaning this test can be used) for the duration of the COVID-19 declaration under Section 564(b)(1) of the Act, 21 U.S.C. section 360bbb-3(b)(1), unless the authorization is terminated or revoked.  Performed at [facility], [place] [address], [place], [place] [address]    WBC 07/06/2022 10.2  4.0 - 10.5 K/uL Final   RBC 07/06/2022 4.50  4.22 - 5.81 MIL/uL Final   Hemoglobin 07/06/2022 13.4  13.0 - 17.0 g/dL Final   HCT 07/06/2022 39.3  39.0 - 52.0 % Final   MCV 07/06/2022 87.3  80.0 - 100.0 fL Final   MCH 07/06/2022 29.8  26.0 - 34.0 pg Final   MCHC 07/06/2022 34.1  30.0 - 36.0 g/dL Final   RDW 07/06/2022 13.2  11.5 - 15.5 % Final   Platelets 07/06/2022 273  150 - 400 K/uL Final   nRBC 07/06/2022 0.0  0.0 - 0.2 % Final   Neutrophils Relative % 07/06/2022 68  % Final   Neutro Abs 07/06/2022 6.9  1.7 - 7.7 K/uL Final   Lymphocytes Relative 07/06/2022 20  % Final   Lymphs Abs 07/06/2022 2.0  0.7 - 4.0 K/uL Final   Monocytes Relative 07/06/2022 10  % Final   Monocytes Absolute 07/06/2022 1.0  0.1 - 1.0 K/uL Final   Eosinophils Relative 07/06/2022 1  % Final   Eosinophils Absolute 07/06/2022 0.1  0.0 - 0.5 K/uL Final   Basophils Relative 07/06/2022 1  % Final   Basophils Absolute 07/06/2022 0.1  0.0 - 0.1 K/uL Final   Immature Granulocytes 07/06/2022 0  % Final   Abs Immature Granulocytes 07/06/2022 0.03  0.00 - 0.07 K/uL Final   Performed at [facility], [place] [address], [place], [place] [address]   Sodium 07/06/2022 137  135 - 145 mmol/L Final   Potassium 07/06/2022 4.0  3.5 - 5.1 mmol/L Final   Chloride 07/06/2022 102  98 - 111 mmol/L Final   CO2 07/06/2022 25  22 - 32 mmol/L Final   Glucose, Bld 07/06/2022 83  70 - 99 mg/dL Final   Glucose reference range applies only to samples taken after fasting for at least 8 hours.   BUN 07/06/2022 16  6 - 20 mg/dL Final   Creatinine, Ser 07/06/2022 1.18  0.61 - 1.24 mg/dL Final   Calcium 07/06/2022 9.6  8.9 - 10.3 mg/dL Final   Total Protein 07/06/2022 7.2  6.5 - 8.1 g/dL Final   Albumin 07/06/2022 3.9  3.5 - 5.0 g/dL Final   AST 07/06/2022 72 (H)  15 - 41 U/L Final   ALT 07/06/2022 60 (H)  0 - 44 U/L Final   Alkaline Phosphatase 07/06/2022 82  38 - 126 U/L Final   Total Bilirubin 07/06/2022 0.6  0.3 - 1.2 mg/dL Final   GFR, Estimated 07/06/2022 >60  >60 mL/min Final   Comment: (NOTE) Calculated using the CKD-EPI Creatinine Equation (2021)    Anion gap 07/06/2022 10  5 - 15 Final   Performed at [facility] [address] [place], [place] [address]   Alcohol, Ethyl (B) 07/06/2022 <10  <10 mg/dL Final   Comment: (NOTE) Lowest detectable limit for serum alcohol is 10 mg/dL.  For medical  purposes only. Performed at [facility], [place] [address] [place], [place] [address]    POC Amphetamine UR 07/06/2022 Positive (PE)  NONE DETECTED (Cut Off Level 1000 ng/mL) Final   POC Secobarbital (BAR) 07/06/2022 None Detected  NONE DETECTED (Cut Off Level 300 ng/mL) Final   POC Buprenorphine (BUP) 07/06/2022 Positive (PE)  NONE DETECTED (Cut Off Level 10 ng/mL) Final   POC Oxazepam (BZO) 07/06/2022 None Detected  NONE DETECTED (Cut Off Level 300 ng/mL) Final   POC Cocaine UR 07/06/2022 None Detected  NONE DETECTED (Cut Off Level 300 ng/mL) Final   POC Methamphetamine UR 07/06/2022 Positive (PE)  NONE DETECTED (Cut Off Level 1000 ng/mL) Final   POC Morphine 07/06/2022 None Detected  NONE  DETECTED (Cut Off Level 300 ng/mL) Final   POC Methadone UR 07/06/2022 None Detected  NONE DETECTED (Cut Off Level 300 ng/mL) Final   POC Oxycodone UR 07/06/2022 None Detected  NONE DETECTED (Cut Off Level 100 ng/mL) Final   POC Marijuana UR 07/06/2022 None Detected  NONE DETECTED (Cut Off Level 50 ng/mL) Final   SARSCOV2ONAVIRUS 2 AG 07/06/2022 NEGATIVE  NEGATIVE Final   Comment: (NOTE) SARS-CoV-2 antigen NOT DETECTED.   Negative results are presumptive.  Negative results do not preclude SARS-CoV-2 infection and should not be used as the sole basis for treatment or other patient management decisions, including infection  control decisions, particularly in the presence of clinical signs and  symptoms consistent with COVID-19, or in those who have been in contact with the virus.  Negative results must be combined with clinical observations, patient history, and epidemiological information. The expected result is Negative.  Fact Sheet for Patients: HandmadeRecipes.com.cy  Fact Sheet for Healthcare Providers: FuneralLife.at  This test is not yet approved or cleared by the [place] FDA and  has been authorized for detection and/or diagnosis of SARS-CoV-2 by FDA under an Emergency Use Authorization (EUA).  This EUA will remain in effect (meaning this test can be used) for the duration of  the COV                          ID-19 declaration under Section 564(b)(1) of the Act, 21 U.S.C. section 360bbb-3(b)(1), unless the authorization is terminated or revoked sooner.     Cholesterol 07/06/2022 137  0 - 200 mg/dL Final   Triglycerides 07/06/2022 56  <150 mg/dL Final   HDL 07/06/2022 45  >40 mg/dL Final   Total CHOL/HDL Ratio 07/06/2022 3.0  RATIO Final   VLDL 07/06/2022 11  0 - 40 mg/dL Final   LDL Cholesterol 07/06/2022 81  0 - 99 mg/dL Final   Comment:        Total Cholesterol/HDL:CHD Risk Coronary Heart Disease Risk Table                      Men   Women  1/2 Average Risk   3.4   3.3  Average Risk       5.0   4.4  2 X Average Risk   9.6   7.1  3 X Average Risk  23.4   11.0        Use the calculated Patient Ratio above and the CHD Risk Table to determine the patient's CHD Risk.        ATP III CLASSIFICATION (LDL):  <100     mg/dL   Optimal  100-129  mg/dL   Near or Above  Optimal  130-159  mg/dL   Borderline  160-189  mg/dL   High  >190     mg/dL   Very High Performed at [facility] [address], [place], [place] [address]    TSH 07/06/2022 1.168  0.350 - 4.500 uIU/mL Final   Comment: Performed by a 3rd Generation assay with a functional sensitivity of <=0.01 uIU/mL. Performed at [facility], [place] [address], [place], [place] [address]   Admission on 07/03/2022, Discharged on 07/03/2022  Component Date Value Ref Range Status   SARS Coronavirus 2 by RT PCR 07/03/2022 NEGATIVE  NEGATIVE Final   Comment: (NOTE) SARS-CoV-2 target nucleic acids are NOT DETECTED.  The SARS-CoV-2 RNA is generally detectable in upper respiratory specimens during the acute phase of infection. The lowest concentration of SARS-CoV-2 viral copies this assay can detect is 138 copies/mL. A negative result does not preclude SARS-Cov-2 infection and should not be used as the sole basis for treatment or other patient management decisions. A negative result may occur with  improper specimen collection/handling, submission of specimen other than nasopharyngeal swab, presence of viral mutation(s) within the areas targeted by this assay, and inadequate number of viral copies(<138 copies/mL). A negative result must be combined with clinical observations, patient history, and epidemiological information. The expected result is Negative.  Fact Sheet for Patients:  EntrepreneurPulse.com.au  Fact Sheet for Healthcare Providers:  IncredibleEmployment.be  This test is no                           t yet approved or cleared by the [place] FDA and  has been authorized for detection and/or diagnosis of SARS-CoV-2 by FDA under an Emergency Use Authorization (EUA). This EUA will remain  in effect (meaning this test can be used) for the duration of the COVID-19 declaration under Section 564(b)(1) of the Act, 21 U.S.C.section 360bbb-3(b)(1), unless the authorization is terminated  or revoked sooner.       Influenza A by PCR 07/03/2022 NEGATIVE  NEGATIVE Final   Influenza B by PCR 07/03/2022 NEGATIVE  NEGATIVE Final   Comment: (NOTE) The Xpert Xpress SARS-CoV-2/FLU/RSV plus assay is intended as an aid in the diagnosis of influenza from Nasopharyngeal swab specimens and should not be used as a sole basis for treatment. Nasal washings and aspirates are unacceptable for Xpert Xpress SARS-CoV-2/FLU/RSV testing.  Fact Sheet for Patients: EntrepreneurPulse.com.au  Fact Sheet for Healthcare Providers: IncredibleEmployment.be  This test is not yet approved or cleared by the [place] FDA and has been authorized for detection and/or diagnosis of SARS-CoV-2 by FDA under an Emergency Use Authorization (EUA). This EUA will remain in effect (meaning this test can be used) for the duration of the COVID-19 declaration under Section 564(b)(1) of the Act, 21 U.S.C. section 360bbb-3(b)(1), unless the authorization is terminated or revoked.  Performed at [facility], [place] [address], [place], [place] [address]    Alcohol, Ethyl (B) 07/03/2022 <10  <10 mg/dL Final   Comment: (NOTE) Lowest detectable limit for serum alcohol is 10 mg/dL.  For medical purposes only. Performed at [facility], [place] [address], [place], [place] [address]    Opiates 07/03/2022 NONE DETECTED  NONE DETECTED Final   Cocaine 07/03/2022 POSITIVE (A)  NONE DETECTED Final   Benzodiazepines 07/03/2022 NONE DETECTED   NONE DETECTED Final   Amphetamines 07/03/2022 POSITIVE (A)  NONE DETECTED Final   Tetrahydrocannabinol 07/03/2022 NONE DETECTED  NONE DETECTED Final   Barbiturates 07/03/2022 NONE DETECTED  NONE DETECTED Final   Comment: (NOTE) DRUG SCREEN FOR MEDICAL PURPOSES ONLY.  IF CONFIRMATION IS NEEDED FOR ANY PURPOSE, NOTIFY LAB WITHIN 5 DAYS.  LOWEST DETECTABLE LIMITS FOR URINE DRUG SCREEN Drug Class                     Cutoff (ng/mL) Amphetamine and metabolites    1000 Barbiturate and metabolites    200 Benzodiazepine                 338 Tricyclics and metabolites     300 Opiates and metabolites        300 Cocaine and metabolites        300 THC                            50 Performed at [facility], [place] [address] [place], [place] [address]   Admission on 04/11/2022, Discharged on 04/14/2022  Component Date Value Ref Range Status   SARS Coronavirus 2 by RT PCR 04/11/2022 NEGATIVE  NEGATIVE Final   Comment: (NOTE) SARS-CoV-2 target nucleic acids are NOT DETECTED.  The SARS-CoV-2 RNA is generally detectable in upper respiratory specimens during the acute phase of infection. The lowest concentration of SARS-CoV-2 viral copies this assay can detect is 138 copies/mL. A negative result does not preclude SARS-Cov-2 infection and should not be used as the sole basis for treatment or other patient management decisions. A negative result may occur with  improper specimen collection/handling, submission of specimen other than nasopharyngeal swab, presence of viral mutation(s) within the areas targeted by this assay, and inadequate number of viral copies(<138 copies/mL). A negative result must be combined with clinical observations, patient history, and epidemiological information. The expected result is Negative.  Fact Sheet for Patients:  EntrepreneurPulse.com.au  Fact Sheet for Healthcare Providers:  IncredibleEmployment.be  This  test is no                          t yet approved or cleared by the [place] FDA and  has been authorized for detection and/or diagnosis of SARS-CoV-2 by FDA under an Emergency Use Authorization (EUA). This EUA will remain  in effect (meaning this test can be used) for the duration of the COVID-19 declaration under Section 564(b)(1) of the Act, 21 U.S.C.section 360bbb-3(b)(1), unless the authorization is terminated  or revoked sooner.       Influenza A by PCR 04/11/2022 NEGATIVE  NEGATIVE Final   Influenza B by PCR 04/11/2022 NEGATIVE  NEGATIVE Final   Comment: (NOTE) The Xpert Xpress SARS-CoV-2/FLU/RSV plus assay is intended as an aid in the diagnosis of influenza from Nasopharyngeal swab specimens and should not be used as a sole basis for treatment. Nasal washings and aspirates are unacceptable for Xpert Xpress SARS-CoV-2/FLU/RSV testing.  Fact Sheet for Patients: EntrepreneurPulse.com.au  Fact Sheet for Healthcare Providers: IncredibleEmployment.be  This test is not yet approved or cleared by the [place] FDA and has been authorized for detection and/or diagnosis of SARS-CoV-2 by FDA under an Emergency Use Authorization (EUA). This EUA will remain in effect (meaning this test can be used) for the duration of the COVID-19 declaration under Section 564(b)(1) of the Act, 21 U.S.C. section 360bbb-3(b)(1), unless the authorization is terminated or revoked.  Performed at [facility], [place] [address] [place], [place] [address]  WBC 04/11/2022 13.9 (H)  4.0 - 10.5 K/uL Final   RBC 04/11/2022 4.37  4.22 - 5.81 MIL/uL Final   Hemoglobin 04/11/2022 13.0  13.0 - 17.0 g/dL Final   HCT 04/11/2022 40.0  39.0 - 52.0 % Final   MCV 04/11/2022 91.5  80.0 - 100.0 fL Final   MCH 04/11/2022 29.7  26.0 - 34.0 pg Final   MCHC 04/11/2022 32.5  30.0 - 36.0 g/dL Final   RDW 04/11/2022 15.1  11.5 - 15.5 % Final   Platelets 04/11/2022 322   150 - 400 K/uL Final   nRBC 04/11/2022 0.0  0.0 - 0.2 % Final   Neutrophils Relative % 04/11/2022 71  % Final   Neutro Abs 04/11/2022 9.9 (H)  1.7 - 7.7 K/uL Final   Lymphocytes Relative 04/11/2022 18  % Final   Lymphs Abs 04/11/2022 2.5  0.7 - 4.0 K/uL Final   Monocytes Relative 04/11/2022 8  % Final   Monocytes Absolute 04/11/2022 1.1 (H)  0.1 - 1.0 K/uL Final   Eosinophils Relative 04/11/2022 2  % Final   Eosinophils Absolute 04/11/2022 0.3  0.0 - 0.5 K/uL Final   Basophils Relative 04/11/2022 0  % Final   Basophils Absolute 04/11/2022 0.0  0.0 - 0.1 K/uL Final   Immature Granulocytes 04/11/2022 1  % Final   Abs Immature Granulocytes 04/11/2022 0.07  0.00 - 0.07 K/uL Final   Performed at [facility], [place] [address], [place], [place] [address]   Sodium 04/11/2022 138  135 - 145 mmol/L Final   Potassium 04/11/2022 4.3  3.5 - 5.1 mmol/L Final   Chloride 04/11/2022 104  98 - 111 mmol/L Final   CO2 04/11/2022 24  22 - 32 mmol/L Final   Glucose, Bld 04/11/2022 80  70 - 99 mg/dL Final   Glucose reference range applies only to samples taken after fasting for at least 8 hours.   BUN 04/11/2022 12  6 - 20 mg/dL Final   Creatinine, Ser 04/11/2022 1.17  0.61 - 1.24 mg/dL Final   Calcium 04/11/2022 9.2  8.9 - 10.3 mg/dL Final   Total Protein 04/11/2022 6.9  6.5 - 8.1 g/dL Final   Albumin 04/11/2022 3.9  3.5 - 5.0 g/dL Final   AST 04/11/2022 33  15 - 41 U/L Final   ALT 04/11/2022 47 (H)  0 - 44 U/L Final   Alkaline Phosphatase 04/11/2022 69  38 - 126 U/L Final   Total Bilirubin 04/11/2022 0.5  0.3 - 1.2 mg/dL Final   GFR, Estimated 04/11/2022 >60  >60 mL/min Final   Comment: (NOTE) Calculated using the CKD-EPI Creatinine Equation (2021)    Anion gap 04/11/2022 10  5 - 15 Final   Performed at [facility] [address] [place], [place] [address]   POC Amphetamine UR 04/11/2022 None Detected  NONE DETECTED (Cut Off Level 1000 ng/mL) Final   POC Secobarbital (BAR) 04/11/2022  None Detected  NONE DETECTED (Cut Off Level 300 ng/mL) Final   POC Buprenorphine (BUP) 04/11/2022 None Detected  NONE DETECTED (Cut Off Level 10 ng/mL) Final   POC Oxazepam (BZO) 04/11/2022 None Detected  NONE DETECTED (Cut Off Level 300 ng/mL) Final   POC Cocaine UR 04/11/2022 None Detected  NONE DETECTED (Cut Off Level 300 ng/mL) Final   POC Methamphetamine UR 04/11/2022 None Detected  NONE DETECTED (Cut Off Level 1000 ng/mL) Final   POC Morphine 04/11/2022 None Detected  NONE DETECTED (Cut Off Level 300 ng/mL) Final  POC Methadone UR 04/11/2022 None Detected  NONE DETECTED (Cut Off Level 300 ng/mL) Final   POC Oxycodone UR 04/11/2022 None Detected  NONE DETECTED (Cut Off Level 100 ng/mL) Final   POC Marijuana UR 04/11/2022 None Detected  NONE DETECTED (Cut Off Level 50 ng/mL) Final  Admission on 03/30/2022, Discharged on 04/01/2022  Component Date Value Ref Range Status   SARS Coronavirus 2 by RT PCR 03/30/2022 NEGATIVE  NEGATIVE Final   Comment: (NOTE) SARS-CoV-2 target nucleic acids are NOT DETECTED.  The SARS-CoV-2 RNA is generally detectable in upper respiratory specimens during the acute phase of infection. The lowest concentration of SARS-CoV-2 viral copies this assay can detect is 138 copies/mL. A negative result does not preclude SARS-Cov-2 infection and should not be used as the sole basis for treatment or other patient management decisions. A negative result may occur with  improper specimen collection/handling, submission of specimen other than nasopharyngeal swab, presence of viral mutation(s) within the areas targeted by this assay, and inadequate number of viral copies(<138 copies/mL). A negative result must be combined with clinical observations, patient history, and epidemiological information. The expected result is Negative.  Fact Sheet for Patients:  EntrepreneurPulse.com.au  Fact Sheet for Healthcare Providers:   IncredibleEmployment.be  This test is no                          t yet approved or cleared by the [place] FDA and  has been authorized for detection and/or diagnosis of SARS-CoV-2 by FDA under an Emergency Use Authorization (EUA). This EUA will remain  in effect (meaning this test can be used) for the duration of the COVID-19 declaration under Section 564(b)(1) of the Act, 21 U.S.C.section 360bbb-3(b)(1), unless the authorization is terminated  or revoked sooner.       Influenza A by PCR 03/30/2022 NEGATIVE  NEGATIVE Final   Influenza B by PCR 03/30/2022 NEGATIVE  NEGATIVE Final   Comment: (NOTE) The Xpert Xpress SARS-CoV-2/FLU/RSV plus assay is intended as an aid in the diagnosis of influenza from Nasopharyngeal swab specimens and should not be used as a sole basis for treatment. Nasal washings and aspirates are unacceptable for Xpert Xpress SARS-CoV-2/FLU/RSV testing.  Fact Sheet for Patients: EntrepreneurPulse.com.au  Fact Sheet for Healthcare Providers: IncredibleEmployment.be  This test is not yet approved or cleared by the [place] FDA and has been authorized for detection and/or diagnosis of SARS-CoV-2 by FDA under an Emergency Use Authorization (EUA). This EUA will remain in effect (meaning this test can be used) for the duration of the COVID-19 declaration under Section 564(b)(1) of the Act, 21 U.S.C. section 360bbb-3(b)(1), unless the authorization is terminated or revoked.  Performed at [facility], [place] [address], [place], [place] [address]    POC Amphetamine UR 03/30/2022 Positive (A)  NONE DETECTED (Cut Off Level 1000 ng/mL) Final   POC Secobarbital (BAR) 03/30/2022 None Detected  NONE DETECTED (Cut Off Level 300 ng/mL) Final   POC Buprenorphine (BUP) 03/30/2022 None Detected  NONE DETECTED (Cut Off Level 10 ng/mL) Final   POC Oxazepam (BZO) 03/30/2022 None Detected  NONE DETECTED (Cut  Off Level 300 ng/mL) Final   POC Cocaine UR 03/30/2022 None Detected  NONE DETECTED (Cut Off Level 300 ng/mL) Final   POC Methamphetamine UR 03/30/2022 Positive (A)  NONE DETECTED (Cut Off Level 1000 ng/mL) Final   POC Morphine 03/30/2022 None Detected  NONE DETECTED (Cut Off Level 300 ng/mL) Final   POC Methadone  UR 03/30/2022 None Detected  NONE DETECTED (Cut Off Level 300 ng/mL) Final   POC Oxycodone UR 03/30/2022 None Detected  NONE DETECTED (Cut Off Level 100 ng/mL) Final   POC Marijuana UR 03/30/2022 Positive (A)  NONE DETECTED (Cut Off Level 50 ng/mL) Final   SARSCOV2ONAVIRUS 2 AG 03/30/2022 NEGATIVE  NEGATIVE Final   Comment: (NOTE) SARS-CoV-2 antigen NOT DETECTED.   Negative results are presumptive.  Negative results do not preclude SARS-CoV-2 infection and should not be used as the sole basis for treatment or other patient management decisions, including infection  control decisions, particularly in the presence of clinical signs and  symptoms consistent with COVID-19, or in those who have been in contact with the virus.  Negative results must be combined with clinical observations, patient history, and epidemiological information. The expected result is Negative.  Fact Sheet for Patients: HandmadeRecipes.com.cy  Fact Sheet for Healthcare Providers: FuneralLife.at  This test is not yet approved or cleared by the [place] FDA and  has been authorized for detection and/or diagnosis of SARS-CoV-2 by FDA under an Emergency Use Authorization (EUA).  This EUA will remain in effect (meaning this test can be used) for the duration of  the COV                          ID-19 declaration under Section 564(b)(1) of the Act, 21 U.S.C. section 360bbb-3(b)(1), unless the authorization is terminated or revoked sooner.    Admission on 03/29/2022, Discharged on 03/30/2022  Component Date Value Ref Range Status   Sodium 03/29/2022 142   135 - 145 mmol/L Final   Potassium 03/29/2022 3.5  3.5 - 5.1 mmol/L Final   Chloride 03/29/2022 110  98 - 111 mmol/L Final   CO2 03/29/2022 24  22 - 32 mmol/L Final   Glucose, Bld 03/29/2022 108 (H)  70 - 99 mg/dL Final   Glucose reference range applies only to samples taken after fasting for at least 8 hours.   BUN 03/29/2022 13  6 - 20 mg/dL Final   Creatinine, Ser 03/29/2022 1.19  0.61 - 1.24 mg/dL Final   Calcium 03/29/2022 9.2  8.9 - 10.3 mg/dL Final   Total Protein 03/29/2022 7.7  6.5 - 8.1 g/dL Final   Albumin 03/29/2022 4.2  3.5 - 5.0 g/dL Final   AST 03/29/2022 61 (H)  15 - 41 U/L Final   ALT 03/29/2022 48 (H)  0 - 44 U/L Final   Alkaline Phosphatase 03/29/2022 64  38 - 126 U/L Final   Total Bilirubin 03/29/2022 0.9  0.3 - 1.2 mg/dL Final   GFR, Estimated 03/29/2022 >60  >60 mL/min Final   Comment: (NOTE) Calculated using the CKD-EPI Creatinine Equation (2021)    Anion gap 03/29/2022 8  5 - 15 Final   Performed at [facility], [place] [address], [place], [place] [address]   Alcohol, Ethyl (B) 03/29/2022 <10  <10 mg/dL Final   Comment: (NOTE) Lowest detectable limit for serum alcohol is 10 mg/dL.  For medical purposes only. Performed at [facility], [place] [address] [place], [place] [address]    Salicylate Lvl 99/35/7017 <7.0 (L)  7.0 - 30.0 mg/dL Final   Performed at [place] [address], [place], [place] [address]   Acetaminophen (Tylenol), Serum 03/29/2022 <10 (L)  10 - 30 ug/mL Final   Comment: (NOTE) Therapeutic concentrations vary significantly. A range of 10-30 ug/mL  may be an  effective concentration for many patients. However, some  are best treated at concentrations outside of this range. Acetaminophen concentrations >150 ug/mL at 4 hours after ingestion  and >50 ug/mL at 12 hours after ingestion are often associated with  toxic reactions.  Performed at [facility], [place]  [address] [place], [place] [address]    WBC 03/29/2022 7.5  4.0 - 10.5 K/uL Final   RBC 03/29/2022 4.21 (L)  4.22 - 5.81 MIL/uL Final   Hemoglobin 03/29/2022 12.2 (L)  13.0 - 17.0 g/dL Final   HCT 03/29/2022 38.1 (L)  39.0 - 52.0 % Final   MCV 03/29/2022 90.5  80.0 - 100.0 fL Final   MCH 03/29/2022 29.0  26.0 - 34.0 pg Final   MCHC 03/29/2022 32.0  30.0 - 36.0 g/dL Final   RDW 03/29/2022 15.3  11.5 - 15.5 % Final   Platelets 03/29/2022 228  150 - 400 K/uL Final   nRBC 03/29/2022 0.0  0.0 - 0.2 % Final   Performed at [facility], [place] [address], [place], [place] [address]   Opiates 03/29/2022 NONE DETECTED  NONE DETECTED Final   Cocaine 03/29/2022 NONE DETECTED  NONE DETECTED Final   Benzodiazepines 03/29/2022 NONE DETECTED  NONE DETECTED Final   Amphetamines 03/29/2022 POSITIVE (A)  NONE DETECTED Final   Tetrahydrocannabinol 03/29/2022 POSITIVE (A)  NONE DETECTED Final   Barbiturates 03/29/2022 NONE DETECTED  NONE DETECTED Final   Comment: (NOTE) DRUG SCREEN FOR MEDICAL PURPOSES ONLY.  IF CONFIRMATION IS NEEDED FOR ANY PURPOSE, NOTIFY LAB WITHIN 5 DAYS.  LOWEST DETECTABLE LIMITS FOR URINE DRUG SCREEN Drug Class                     Cutoff (ng/mL) Amphetamine and metabolites    1000 Barbiturate and metabolites    200 Benzodiazepine                 778 Tricyclics and metabolites     300 Opiates and metabolites        300 Cocaine and metabolites        300 THC                            50 Performed at [facility], [place] [address], [place], [place] [address]    Troponin I (High Sensitivity) 03/29/2022 3  <18 ng/L Final   Comment: (NOTE) Elevated high sensitivity troponin I (hsTnI) values and significant  changes across serial measurements may suggest ACS but many other  chronic and acute conditions are known to elevate hsTnI results.  Refer to the "Links" section for chest pain algorithms and additional  guidance. Performed at [facility], [place] [address] [place], [place] [address]    SARS Coronavirus 2 by RT PCR 03/29/2022 NEGATIVE  NEGATIVE Final   Comment: (NOTE) SARS-CoV-2 target nucleic acids are NOT DETECTED.  The SARS-CoV-2 RNA is generally detectable in upper and lower respiratory specimens during the acute phase of infection. The lowest concentration of SARS-CoV-2 viral copies this assay can detect is 250 copies / mL. A negative result does not preclude SARS-CoV-2 infection and should not be used as the sole basis for treatment or other patient management decisions.  A negative result may occur with improper specimen collection / handling, submission of specimen other than nasopharyngeal swab, presence of viral mutation(s) within the areas targeted by this assay, and inadequate number of viral  copies (<250 copies / mL). A negative result must be combined with clinical observations, patient history, and epidemiological information.  Fact Sheet for Patients:   https://www.patel.info/  Fact Sheet for Healthcare Providers: https://hall.com/  This test is not yet approved or                           cleared by the [place] FDA and has been authorized for detection and/or diagnosis of SARS-CoV-2 by FDA under an Emergency Use Authorization (EUA).  This EUA will remain in effect (meaning this test can be used) for the duration of the COVID-19 declaration under Section 564(b)(1) of the Act, 21 U.S.C. section 360bbb-3(b)(1), unless the authorization is terminated or revoked sooner.  Performed at [facility], [place] [address], [place], [place] [address]    SARS Coronavirus 2 by RT PCR 03/30/2022 NEGATIVE  NEGATIVE Final   Comment: (NOTE) SARS-CoV-2 target nucleic acids are NOT DETECTED.  The SARS-CoV-2 RNA is generally detectable in upper respiratory specimens during the acute phase of infection. The lowest concentration  of SARS-CoV-2 viral copies this assay can detect is 138 copies/mL. A negative result does not preclude SARS-Cov-2 infection and should not be used as the sole basis for treatment or other patient management decisions. A negative result may occur with  improper specimen collection/handling, submission of specimen other than nasopharyngeal swab, presence of viral mutation(s) within the areas targeted by this assay, and inadequate number of viral copies(<138 copies/mL). A negative result must be combined with clinical observations, patient history, and epidemiological information. The expected result is Negative.  Fact Sheet for Patients:  EntrepreneurPulse.com.au  Fact Sheet for Healthcare Providers:  IncredibleEmployment.be  This test is no                          t yet approved or cleared by the [place] FDA and  has been authorized for detection and/or diagnosis of SARS-CoV-2 by FDA under an Emergency Use Authorization (EUA). This EUA will remain  in effect (meaning this test can be used) for the duration of the COVID-19 declaration under Section 564(b)(1) of the Act, 21 U.S.C.section 360bbb-3(b)(1), unless the authorization is terminated  or revoked sooner.       Influenza A by PCR 03/30/2022 NEGATIVE  NEGATIVE Final   Influenza B by PCR 03/30/2022 NEGATIVE  NEGATIVE Final   Comment: (NOTE) The Xpert Xpress SARS-CoV-2/FLU/RSV plus assay is intended as an aid in the diagnosis of influenza from Nasopharyngeal swab specimens and should not be used as a sole basis for treatment. Nasal washings and aspirates are unacceptable for Xpert Xpress SARS-CoV-2/FLU/RSV testing.  Fact Sheet for Patients: EntrepreneurPulse.com.au  Fact Sheet for Healthcare Providers: IncredibleEmployment.be  This test is not yet approved or cleared by the [place] FDA and has been authorized for detection and/or diagnosis  of SARS-CoV-2 by FDA under an Emergency Use Authorization (EUA). This EUA will remain in effect (meaning this test can be used) for the duration of the COVID-19 declaration under Section 564(b)(1) of the Act, 21 U.S.C. section 360bbb-3(b)(1), unless the authorization is terminated or revoked.  Performed at [facility], [place] [address], [place], [place] [address]     Blood Alcohol level:  Lab Results  Component Value Date   [facility] <10 08/09/2022   ETH <10 71/24/5809    Metabolic Disorder Labs: Lab Results  Component Value Date   HGBA1C 5.7 (H)  08/09/2022   MPG 116.89 08/09/2022   MPG 119.76 01/27/2021   No results found for: "PROLACTIN" Lab Results  Component Value Date   CHOL 139 08/09/2022   TRIG 33 08/09/2022   HDL 61 08/09/2022   CHOLHDL 2.3 08/09/2022   VLDL 7 08/09/2022   LDLCALC 71 08/09/2022   LDLCALC 81 07/06/2022    Therapeutic Lab Levels: No results found for: "LITHIUM" No results found for: "VALPROATE" No results found for: "CBMZ"  Physical Findings   AIMS    Flowsheet Row Admission (Discharged) from 12/21/2020 in [place] [facility] Admission (Discharged) from 03/25/2018 in [place] [facility]  AIMS Total Score 0 0      AUDIT    Flowsheet Row Admission (Discharged) from 12/21/2020 in [place] [facility] ED from 05/02/2020 in [facility] Admission (Discharged) from 03/25/2018 in [place] [facility]  Alcohol Use Disorder Identification Test Final Score (AUDIT) 0 4 0      PHQ2-9    Flowsheet Row ED from 08/09/2022 in [facility] ED from 07/06/2022 in [facility] ED from 04/11/2022 in [facility] ED from 03/30/2022 in [facility] ED from 05/02/2020 in [place] DEPT   PHQ-2 Total Score _0 PHQ-9 Total Score _1 Flowsheet Row ED from 08/09/2022 in [facility] ED from 08/01/2022 in [place] ED from 07/06/2022 in [place] No Risk No Risk No Risk        Musculoskeletal  Strength & Muscle Tone: within normal limits Gait & Station: normal Patient leans: N/A  Psychiatric Specialty Exam  Presentation  General Appearance:  Appropriate for Environment  Eye Contact: Poor  Speech: Clear and Coherent  Speech Volume: Normal  Handedness: Right   Mood and Affect  Mood: Anxious; Dysphoric  Affect: Congruent   Thought Process  Thought Processes: Coherent; Goal Directed  Descriptions of Associations:Intact  Orientation:Full (Time, Place and Person)  Thought Content:Logical  Diagnosis of Schizophrenia or Schizoaffective disorder in past: No    Hallucinations:Hallucinations: None  Ideas of Reference:None  Suicidal Thoughts:Suicidal Thoughts: No SI Passive Intent and/or Plan: With Intent  Homicidal Thoughts:Homicidal Thoughts: No   Sensorium  Memory: Immediate Fair; Recent Fair  Judgment: Fair  Insight: Fair   Community education officer  Concentration: Poor  Attention Span: Poor  Recall: [employer] of Knowledge: Fair  Language: Fair   Psychomotor Activity  Psychomotor Activity: Psychomotor Activity: Normal   Assets  Assets: Desire for Improvement; Resilience   Sleep  Sleep: Sleep: Good   Nutritional Assessment (For OBS and [facility] admissions only) Has the patient had a weight loss or gain of 10 pounds or more in the last 3 months?: No Has the patient had a decrease in food intake/or appetite?: No Does the patient have dental problems?: No Does the patient have eating habits or behaviors that may be indicators of an eating disorder including binging or inducing  vomiting?: No Has the patient recently lost weight without trying?: 0 Has the patient been eating poorly because of a decreased appetite?: 0 Malnutrition Screening Tool Score: 0    Physical Exam  Physical Exam Vitals and nursing note reviewed.  Constitutional:      General: He is not  in acute distress.    Appearance: Normal appearance. He is normal weight. He is not ill-appearing or toxic-appearing.  HENT:     Head: Normocephalic and atraumatic.  Pulmonary:     Effort: Pulmonary effort is normal.  Musculoskeletal:        General: Normal range of motion.  Neurological:     General: No focal deficit present.     Mental Status: He is alert.    Review of Systems  Constitutional:  Positive for chills and diaphoresis.  Respiratory:  Negative for cough and shortness of breath.   Cardiovascular:  Negative for chest pain.  Gastrointestinal:  Positive for nausea. Negative for abdominal pain, constipation, diarrhea and vomiting.  Musculoskeletal:  Positive for myalgias.  Neurological:  Positive for tremors. Negative for dizziness, weakness and headaches.  Psychiatric/Behavioral:  Negative for depression, hallucinations and suicidal ideas. The patient is nervous/anxious.    Blood pressure 104/73, pulse 91, temperature 97.8 F (36.6 C), temperature source Oral, resp. rate 18, SpO2 100 %. There is no height or weight on file to calculate BMI.  Treatment Plan Summary: Daily contact with patient to assess and evaluate symptoms and progress in treatment and Medication management  Virgilio A. Misch is a 36 yr old male who presented to [facility] on 10/14 and was accepted to [facility] for substance abuse.  PPHx is significant for Bipolar Disorder, GAD, Substance Induced Mood Disorder, and Polysubstance Abuse (EtOH, Opioids, Cocaine, Amphetamines, THC).  He has been to Residential Treatment prior and has a history of Withdrawal Seizures.   Brian [name] is having some withdrawal  symptoms but not severe.  Since he has not been taking his medications for a few weeks we will have to titrate them.  We will increase his nighttime Seroquel to 100 mg tonight and stop the morning dose tomorrow.  Can consider increasing Wellbutrin tomorrow or Tuesday.  We will not make any other medication changes at this time.  We will continue to monitor.   GAD: -Continue Wellbutrin 150 mg daily -Change Seroquel to 100 mg QHS -Continue Gabapentin 300 mg BID   Withdrawal: -Continue CIWA, last score= 0  @ 10/15  1200 -Continue Ativan 1 mg q6 PRN CIWA>10 or Seizure -Continue Imodium 2-4 mg PRN diarrhea -Continue Robaxin 500 mg q8 PRN muscle spasms -Continue Zofran-ODT 4 mg q6 PRN nausea -Continue Thiamine 100 mg daily for nutritional supplementation -Continue Multivitamin daily for nutritional supplementation   -Continue Neosporin PRN to wound on toe -Continue Lipitor 20 mg daily -Continue PRN's: Tylenol, Maalox, Atarax, Milk of Magnesia   [name], MD 08/10/2022 12:01 PM

## 2022-08-10 NOTE — ED Notes (Signed)
Pt asleep in bed. Respirations even and unlabored. Monitoring for safety. 

## 2022-08-11 ENCOUNTER — Encounter (HOSPITAL_COMMUNITY): Payer: Self-pay

## 2022-08-11 DIAGNOSIS — F1920 Other psychoactive substance dependence, uncomplicated: Secondary | ICD-10-CM | POA: Diagnosis not present

## 2022-08-11 DIAGNOSIS — F1120 Opioid dependence, uncomplicated: Secondary | ICD-10-CM | POA: Diagnosis not present

## 2022-08-11 MED ORDER — GABAPENTIN 300 MG PO CAPS
300.0000 mg | ORAL_CAPSULE | Freq: Three times a day (TID) | ORAL | Status: DC
  Administered 2022-08-11 – 2022-08-13 (×7): 300 mg via ORAL
  Filled 2022-08-11 (×7): qty 1

## 2022-08-11 MED ORDER — QUETIAPINE FUMARATE 200 MG PO TABS
200.0000 mg | ORAL_TABLET | Freq: Every day | ORAL | Status: DC
  Administered 2022-08-11 – 2022-08-12 (×2): 200 mg via ORAL
  Filled 2022-08-11 (×2): qty 1

## 2022-08-11 MED ORDER — ATORVASTATIN CALCIUM 40 MG PO TABS
80.0000 mg | ORAL_TABLET | Freq: Every day | ORAL | Status: DC
  Administered 2022-08-12 – 2022-08-13 (×2): 80 mg via ORAL
  Filled 2022-08-11 (×2): qty 2

## 2022-08-11 NOTE — ED Notes (Signed)
Pt is in the bed sleeping. Respirations are even and unlabored. No acute distress noted. Will continue to monitor for safety. 

## 2022-08-11 NOTE — ED Provider Notes (Signed)
Behavioral Health Progress Note  Date and Time: 08/11/2022 5:37 PM Name: Brian [name] MRN:  [id number]  Subjective:   Brian [name] is a 36 yr old male who presented to [facility] on 10/14 and was accepted to [facility] for Residential Treatment for substance abuse.  PPHx is significant for Bipolar Disorder, GAD, Substance Induced Mood Disorder, and Polysubstance Abuse (EtOH, Opioids, Cocaine, Amphetamines, THC).  He has been to Residential Treatment prior and has a history of Withdrawal Seizures.  On interview with the patient today, he confirms experiencing several withdrawal seizures but states that these have not occurred for years.  He states that he has not been drinking "much" alcohol prior to admission.  The patient reports abusing illicitly obtained Klonopin over the past several weeks.  The patient not been placed on a taper for benzodiazepine or alcohol withdrawal.  Do not feel this is necessary based on remote seizure history, lack of compelling withdrawal symptoms, and intermittent use of Klonopin.  The patient reports that he is interested in going to residential rehab.  He states that he has [employer] and that this is active.  He states that he has no money for co-pay.  He states that he has been to [facility] and does not want to return there.  He reports that he has been to [facility] and would return there.  The patient expresses frustration with how his medications are currently ordered.  Discussed his medications extensively and put him back on his home regimen as listed in the chart.  The exception to this is his Wellbutrin.  I do not feel it is safe for the patient to be on 300 mg Wellbutrin.  The patient is already been restarted on 150 mg of Wellbutrin.  We will continue this at the present dose.  Diagnosis:  Final diagnoses:  Polysubstance dependence including opioid type drug, episodic abuse ([place])  Housing instability after recent homelessness     Total Time spent with patient: 20 minutes  Past Psychiatric History: Bipolar Disorder, GAD, Substance Induced Mood Disorder, and Polysubstance Abuse (EtOH, Opioids, Cocaine, Amphetamines, THC).  He has been to Residential Treatment prior and has a history of Withdrawal Seizures. 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 Collene Mares about sedation type/timinng - I recommend MAC   HAND SURGERY  2006   LEFT HEART CATH AND CORONARY ANGIOGRAPHY N/A 06/21/2020   Procedure: LEFT HEART CATH AND CORONARY ANGIOGRAPHY;  Surgeon: Nelva Bush, MD;  Location: [facility];  Service: Cardiovascular;  Laterality: N/A;   Family History:  Family History  Problem Relation Age of Onset   Heart disease Father    Family Psychiatric  History: Reports None Social History:  Social History   Substance and Sexual Activity  Alcohol Use Not Currently     Social History  Substance and Sexual Activity  Drug Use Yes   Types: IV, Methamphetamines    Social History   Socioeconomic History   Marital status: Single    Spouse name: Not on file   Number of children: Not on file   Years of education: Not on file   Highest education level: Not on file  Occupational History   Not on file  Tobacco Use   Smoking status: Every Day    Packs/day: 0.50    Types: Cigarettes   Smokeless tobacco: Never  Vaping Use   Vaping Use: Never used   Substance and Sexual Activity   Alcohol use: Not Currently   Drug use: Yes    Types: IV, Methamphetamines   Sexual activity: Not Currently  Other Topics Concern   Not on file  Social History Narrative   ** Merged History Encounter **       Lives with a friend   Lost house and custody of son 9broke up with fiancee)   Nature conservation officer   Social Determinants of Health   Financial Resource Strain: Not on file  Food Insecurity: Not on file  Transportation Needs: Not on file  Physical Activity: Not on file  Stress: Not on file  Social Connections: Not on file   SDOH:  [place]: High Risk (08/09/2020)  Alcohol Screen: Low Risk  (12/21/2020)  Depression (PHQ2-9): High Risk (08/09/2022)  Tobacco Use: High Risk (08/01/2022)   Additional Social History:    Pain Medications: See MAR Prescriptions: See MAR Over the Counter: See MAR History of alcohol / drug use?: Yes Longest period of sobriety (when/how long): Pt reports, three years. Name of Substance 1: Methamphetamines.  Pt thinks it could have been cut with Fentanyl. 1 - Age of First Use: UTA 1 - Amount (size/oz): Pt reports, using about $20 worth this morning. 1 - Frequency: Per pt, almost everyday. 1 - Duration: Ongoing. 1 - Last Use / Amount: 08/08/2022. 1 - Method of Aquiring: Purchase. 1- Route of Use: UTA Name of Substance 2: Crack Cocaine. Pt thinks it could have been cut with Fentanyl. 2 - Age of First Use: UTA 2 - Amount (size/oz): Pt reports, using a gram or two a few days ago. 2 - Frequency: Ongoing. 2 - Duration: Ongoing. 2 - Last Use / Amount: A few days ago. 2 - Method of Aquiring: Purchase. 2 - Route of Substance Use: UTA                Sleep: Good  Appetite:  Good  Current Medications:  Current Facility-Administered Medications  Medication Dose Route Frequency Provider Last Rate Last Admin   acetaminophen (TYLENOL) tablet 650 mg  650 mg Oral Q6H PRN Onuoha, Chinwendu V, NP    650 mg at 08/09/22 0255   alum & mag hydroxide-simeth (MAALOX/MYLANTA) 200-200-20 MG/5ML suspension 30 mL  30 mL Oral Q4H PRN Onuoha, Chinwendu V, NP       [START ON 08/12/2022] atorvastatin (LIPITOR) tablet 80 mg  80 mg Oral Daily Corky Sox, MD       buPROPion (WELLBUTRIN XL) 24 hr tablet 150 mg  150 mg Oral Daily Derrill Center, NP   150 mg at 08/11/22 0924   dicyclomine (BENTYL) tablet 20 mg  20 mg Oral Q6H PRN Onuoha, Chinwendu V, NP       gabapentin (NEURONTIN) capsule 300 mg  300 mg Oral TID Corky Sox, MD   300 mg at 08/11/22 1537   hydrOXYzine (  ATARAX) tablet 25 mg  25 mg Oral Q6H PRN Onuoha, Chinwendu V, NP   25 mg at 08/10/22 1957   loperamide (IMODIUM) capsule 2-4 mg  2-4 mg Oral PRN Onuoha, Chinwendu V, NP       LORazepam (ATIVAN) tablet 1 mg  1 mg Oral Q6H PRN Pashayan, Redgie Grayer, MD       Or   LORazepam (ATIVAN) injection 1 mg  1 mg Intramuscular Q6H PRN Pashayan, Redgie Grayer, MD       magnesium hydroxide (MILK OF MAGNESIA) suspension 30 mL  30 mL Oral Daily PRN Onuoha, Chinwendu V, NP       methocarbamol (ROBAXIN) tablet 500 mg  500 mg Oral Q8H PRN Onuoha, Chinwendu V, NP   500 mg at 08/10/22 1957   neomycin-bacitracin-polymyxin (NEOSPORIN) ointment   Topical PRN Nwoko, Uchenna E, PA       ondansetron (ZOFRAN-ODT) disintegrating tablet 4 mg  4 mg Oral Q6H PRN Onuoha, Chinwendu V, NP       QUEtiapine (SEROQUEL) tablet 200 mg  200 mg Oral QHS Corky Sox, MD       thiamine (VITAMIN B1) tablet 100 mg  100 mg Oral Daily [name], MD   100 mg at 08/11/22 6659   Current Outpatient Medications  Medication Sig Dispense Refill   buPROPion (WELLBUTRIN XL) 300 MG 24 hr tablet Take 1 tablet (300 mg total) by mouth daily for 7 days. 7 tablet 0   gabapentin (NEURONTIN) 300 MG capsule Take 300 mg by mouth 3 (three) times daily.     hydrOXYzine (ATARAX) 25 MG tablet Take 1 tablet (25 mg total) by mouth 3 (three) times daily as needed for anxiety. 90 tablet 1    LIPITOR 80 MG tablet Take 1 tablet (80 mg total) by mouth at bedtime. 30 tablet 1   QUEtiapine (SEROQUEL) 200 MG tablet Take 200 mg by mouth at bedtime.      Labs  Lab Results:  Admission on 08/09/2022  Component Date Value Ref Range Status   SARS Coronavirus 2 by RT PCR 08/09/2022 NEGATIVE  NEGATIVE Final   Comment: (NOTE) SARS-CoV-2 target nucleic acids are NOT DETECTED.  The SARS-CoV-2 RNA is generally detectable in upper respiratory specimens during the acute phase of infection. The lowest concentration of SARS-CoV-2 viral copies this assay can detect is 138 copies/mL. A negative result does not preclude SARS-Cov-2 infection and should not be used as the sole basis for treatment or other patient management decisions. A negative result may occur with  improper specimen collection/handling, submission of specimen other than nasopharyngeal swab, presence of viral mutation(s) within the areas targeted by this assay, and inadequate number of viral copies(<138 copies/mL). A negative result must be combined with clinical observations, patient history, and epidemiological information. The expected result is Negative.  Fact Sheet for Patients:  EntrepreneurPulse.com.au  Fact Sheet for Healthcare Providers:  IncredibleEmployment.be  This test is no                          t yet approved or cleared by the [place] FDA and  has been authorized for detection and/or diagnosis of SARS-CoV-2 by FDA under an Emergency Use Authorization (EUA). This EUA will remain  in effect (meaning this test can be used) for the duration of the COVID-19 declaration under Section 564(b)(1) of the Act, 21 U.S.C.section 360bbb-3(b)(1), unless the authorization is terminated  or revoked sooner.  Influenza A by PCR 08/09/2022 NEGATIVE  NEGATIVE Final   Influenza B by PCR 08/09/2022 NEGATIVE  NEGATIVE Final   Comment: (NOTE) The Xpert Xpress SARS-CoV-2/FLU/RSV  plus assay is intended as an aid in the diagnosis of influenza from Nasopharyngeal swab specimens and should not be used as a sole basis for treatment. Nasal washings and aspirates are unacceptable for Xpert Xpress SARS-CoV-2/FLU/RSV testing.  Fact Sheet for Patients: EntrepreneurPulse.com.au  Fact Sheet for Healthcare Providers: IncredibleEmployment.be  This test is not yet approved or cleared by the [place] FDA and has been authorized for detection and/or diagnosis of SARS-CoV-2 by FDA under an Emergency Use Authorization (EUA). This EUA will remain in effect (meaning this test can be used) for the duration of the COVID-19 declaration under Section 564(b)(1) of the Act, 21 U.S.C. section 360bbb-3(b)(1), unless the authorization is terminated or revoked.  Performed at [facility], [place] [address], [place], [place] [address]    WBC 08/09/2022 13.1 (H)  4.0 - 10.5 K/uL Final   RBC 08/09/2022 4.69  4.22 - 5.81 MIL/uL Final   Hemoglobin 08/09/2022 14.1  13.0 - 17.0 g/dL Final   HCT 08/09/2022 43.1  39.0 - 52.0 % Final   MCV 08/09/2022 91.9  80.0 - 100.0 fL Final   MCH 08/09/2022 30.1  26.0 - 34.0 pg Final   MCHC 08/09/2022 32.7  30.0 - 36.0 g/dL Final   RDW 08/09/2022 15.6 (H)  11.5 - 15.5 % Final   Platelets 08/09/2022 299  150 - 400 K/uL Final   nRBC 08/09/2022 0.0  0.0 - 0.2 % Final   Neutrophils Relative % 08/09/2022 79  % Final   Neutro Abs 08/09/2022 10.3 (H)  1.7 - 7.7 K/uL Final   Lymphocytes Relative 08/09/2022 12  % Final   Lymphs Abs 08/09/2022 1.6  0.7 - 4.0 K/uL Final   Monocytes Relative 08/09/2022 7  % Final   Monocytes Absolute 08/09/2022 1.0  0.1 - 1.0 K/uL Final   Eosinophils Relative 08/09/2022 1  % Final   Eosinophils Absolute 08/09/2022 0.1  0.0 - 0.5 K/uL Final   Basophils Relative 08/09/2022 0  % Final   Basophils Absolute 08/09/2022 0.0  0.0 - 0.1 K/uL Final   Immature Granulocytes 08/09/2022 1  % Final    Abs Immature Granulocytes 08/09/2022 0.07  0.00 - 0.07 K/uL Final   Performed at [facility], [place] [address] [place], [place] [address]   Sodium 08/09/2022 136  135 - 145 mmol/L Final   Potassium 08/09/2022 4.1  3.5 - 5.1 mmol/L Final   Chloride 08/09/2022 102  98 - 111 mmol/L Final   CO2 08/09/2022 26  22 - 32 mmol/L Final   Glucose, Bld 08/09/2022 101 (H)  70 - 99 mg/dL Final   Glucose reference range applies only to samples taken after fasting for at least 8 hours.   BUN 08/09/2022 11  6 - 20 mg/dL Final   Creatinine, Ser 08/09/2022 1.26 (H)  0.61 - 1.24 mg/dL Final   Calcium 08/09/2022 9.5  8.9 - 10.3 mg/dL Final   Total Protein 08/09/2022 7.5  6.5 - 8.1 g/dL Final   Albumin 08/09/2022 4.0  3.5 - 5.0 g/dL Final   AST 08/09/2022 23  15 - 41 U/L Final   ALT 08/09/2022 23  0 - 44 U/L Final   Alkaline Phosphatase 08/09/2022 65  38 - 126 U/L Final   Total Bilirubin 08/09/2022 0.4  0.3 - 1.2 mg/dL Final  GFR, Estimated 08/09/2022 >60  >60 mL/min Final   Comment: (NOTE) Calculated using the CKD-EPI Creatinine Equation (2021)    Anion gap 08/09/2022 8  5 - 15 Final   Performed at [facility], [place] [address], [place], [place] [address]   Hgb A1c MFr Bld 08/09/2022 5.7 (H)  4.8 - 5.6 % Final   Comment: (NOTE) Pre diabetes:          5.7%-6.4%  Diabetes:              >6.4%  Glycemic control for   <7.0% adults with diabetes    Mean Plasma Glucose 08/09/2022 116.89  mg/dL Final   Performed at [facility], [place] [address] [place], [place] [address]   Alcohol, Ethyl (B) 08/09/2022 <10  <10 mg/dL Final   Comment: (NOTE) Lowest detectable limit for serum alcohol is 10 mg/dL.  For medical purposes only. Performed at [facility], [place] [address], [place], [place] [address]    POC Amphetamine UR 08/09/2022 None Detected  NONE DETECTED (Cut Off Level 1000 ng/mL) Preliminary   POC Secobarbital (BAR) 08/09/2022 None Detected  NONE DETECTED (Cut Off Level 300 ng/mL)  Preliminary   POC Buprenorphine (BUP) 08/09/2022 None Detected  NONE DETECTED (Cut Off Level 10 ng/mL) Preliminary   POC Oxazepam (BZO) 08/09/2022 None Detected  NONE DETECTED (Cut Off Level 300 ng/mL) Preliminary   POC Cocaine UR 08/09/2022 None Detected  NONE DETECTED (Cut Off Level 300 ng/mL) Preliminary   POC Methamphetamine UR 08/09/2022 Positive (A)  NONE DETECTED (Cut Off Level 1000 ng/mL) Preliminary   POC Morphine 08/09/2022 None Detected  NONE DETECTED (Cut Off Level 300 ng/mL) Preliminary   POC Methadone UR 08/09/2022 None Detected  NONE DETECTED (Cut Off Level 300 ng/mL) Preliminary   POC Oxycodone UR 08/09/2022 None Detected  NONE DETECTED (Cut Off Level 100 ng/mL) Preliminary   POC Marijuana UR 08/09/2022 Positive (A)  NONE DETECTED (Cut Off Level 50 ng/mL) Preliminary   SARSCOV2ONAVIRUS 2 AG 08/09/2022 NEGATIVE  NEGATIVE Final   Comment: (NOTE) SARS-CoV-2 antigen NOT DETECTED.   Negative results are presumptive.  Negative results do not preclude SARS-CoV-2 infection and should not be used as the sole basis for treatment or other patient management decisions, including infection  control decisions, particularly in the presence of clinical signs and  symptoms consistent with COVID-19, or in those who have been in contact with the virus.  Negative results must be combined with clinical observations, patient history, and epidemiological information. The expected result is Negative.  Fact Sheet for Patients: HandmadeRecipes.com.cy  Fact Sheet for Healthcare Providers: FuneralLife.at  This test is not yet approved or cleared by the [place] FDA and  has been authorized for detection and/or diagnosis of SARS-CoV-2 by FDA under an Emergency Use Authorization (EUA).  This EUA will remain in effect (meaning this test can be used) for the duration of  the COV                          ID-19 declaration under Section 564(b)(1) of  the Act, 21 U.S.C. section 360bbb-3(b)(1), unless the authorization is terminated or revoked sooner.     Cholesterol 08/09/2022 139  0 - 200 mg/dL Final   Triglycerides 08/09/2022 33  <150 mg/dL Final   HDL 08/09/2022 61  >40 mg/dL Final   Total CHOL/HDL Ratio 08/09/2022 2.3  RATIO Final   VLDL 08/09/2022 7  0 - 40  mg/dL Final   LDL Cholesterol 08/09/2022 71  0 - 99 mg/dL Final   Comment:        Total Cholesterol/HDL:CHD Risk Coronary Heart Disease Risk Table                     Men   Women  1/2 Average Risk   3.4   3.3  Average Risk       5.0   4.4  2 X Average Risk   9.6   7.1  3 X Average Risk  23.4   11.0        Use the calculated Patient Ratio above and the CHD Risk Table to determine the patient's CHD Risk.        ATP III CLASSIFICATION (LDL):  <100     mg/dL   Optimal  100-129  mg/dL   Near or Above                    Optimal  130-159  mg/dL   Borderline  160-189  mg/dL   High  >190     mg/dL   Very High Performed at [facility] [address], [place], [place] [address]    TSH 08/09/2022 2.293  0.350 - 4.500 uIU/mL Final   Comment: Performed by a 3rd Generation assay with a functional sensitivity of <=0.01 uIU/mL. Performed at [facility], [place] [address] [place], [place] [address]   Admission on 08/01/2022, Discharged on 08/03/2022  Component Date Value Ref Range Status   Sodium 08/01/2022 142  135 - 145 mmol/L Final   Potassium 08/01/2022 3.8  3.5 - 5.1 mmol/L Final   Chloride 08/01/2022 111  98 - 111 mmol/L Final   CO2 08/01/2022 22  22 - 32 mmol/L Final   Glucose, Bld 08/01/2022 94  70 - 99 mg/dL Final   Glucose reference range applies only to samples taken after fasting for at least 8 hours.   BUN 08/01/2022 12  6 - 20 mg/dL Final   Creatinine, Ser 08/01/2022 1.50 (H)  0.61 - 1.24 mg/dL Final   Calcium 08/01/2022 8.9  8.9 - 10.3 mg/dL Final   Total Protein 08/01/2022 6.9  6.5 - 8.1 g/dL Final   Albumin 08/01/2022 3.8  3.5 - 5.0 g/dL Final    AST 08/01/2022 38  15 - 41 U/L Final   ALT 08/01/2022 29  0 - 44 U/L Final   Alkaline Phosphatase 08/01/2022 64  38 - 126 U/L Final   Total Bilirubin 08/01/2022 0.7  0.3 - 1.2 mg/dL Final   GFR, Estimated 08/01/2022 >60  >60 mL/min Final   Comment: (NOTE) Calculated using the CKD-EPI Creatinine Equation (2021)    Anion gap 08/01/2022 9  5 - 15 Final   Performed at [place] [address], [place], [place] [address]   Alcohol, Ethyl (B) 08/01/2022 <10  <10 mg/dL Final   Comment: (NOTE) Lowest detectable limit for serum alcohol is 10 mg/dL.  For medical purposes only. Performed at [facility], [place] [address], [place], [place] [address]    Opiates 08/01/2022 NONE DETECTED  NONE DETECTED Final   Cocaine 08/01/2022 POSITIVE (A)  NONE DETECTED Final   Benzodiazepines 08/01/2022 NONE DETECTED  NONE DETECTED Final   Amphetamines 08/01/2022 POSITIVE (A)  NONE DETECTED Final   Tetrahydrocannabinol 08/01/2022 POSITIVE (A)  NONE DETECTED Final   Barbiturates 08/01/2022 NONE DETECTED  NONE DETECTED Final   Comment: (  NOTE) DRUG SCREEN FOR MEDICAL PURPOSES ONLY.  IF CONFIRMATION IS NEEDED FOR ANY PURPOSE, NOTIFY LAB WITHIN 5 DAYS.  LOWEST DETECTABLE LIMITS FOR URINE DRUG SCREEN Drug Class                     Cutoff (ng/mL) Amphetamine and metabolites    1000 Barbiturate and metabolites    200 Benzodiazepine                 621 Tricyclics and metabolites     300 Opiates and metabolites        300 Cocaine and metabolites        300 THC                            50 Performed at [facility], [place] [address], [place], [place] [address]    WBC 08/01/2022 11.3 (H)  4.0 - 10.5 K/uL Final   RBC 08/01/2022 4.16 (L)  4.22 - 5.81 MIL/uL Final   Hemoglobin 08/01/2022 12.5 (L)  13.0 - 17.0 g/dL Final   HCT 08/01/2022 38.4 (L)  39.0 - 52.0 % Final   MCV 08/01/2022 92.3  80.0 - 100.0 fL Final   MCH 08/01/2022 30.0  26.0 - 34.0 pg Final   MCHC 08/01/2022 32.6  30.0 - 36.0  g/dL Final   RDW 08/01/2022 15.2  11.5 - 15.5 % Final   Platelets 08/01/2022 247  150 - 400 K/uL Final   nRBC 08/01/2022 0.0  0.0 - 0.2 % Final   Neutrophils Relative % 08/01/2022 78  % Final   Neutro Abs 08/01/2022 8.7 (H)  1.7 - 7.7 K/uL Final   Lymphocytes Relative 08/01/2022 10  % Final   Lymphs Abs 08/01/2022 1.2  0.7 - 4.0 K/uL Final   Monocytes Relative 08/01/2022 10  % Final   Monocytes Absolute 08/01/2022 1.1 (H)  0.1 - 1.0 K/uL Final   Eosinophils Relative 08/01/2022 2  % Final   Eosinophils Absolute 08/01/2022 0.2  0.0 - 0.5 K/uL Final   Basophils Relative 08/01/2022 0  % Final   Basophils Absolute 08/01/2022 0.1  0.0 - 0.1 K/uL Final   Immature Granulocytes 08/01/2022 0  % Final   Abs Immature Granulocytes 08/01/2022 0.03  0.00 - 0.07 K/uL Final   Performed at [facility], [place] [address] [place], [place] [address]  Admission on 07/06/2022, Discharged on 07/07/2022  Component Date Value Ref Range Status   SARS Coronavirus 2 by RT PCR 07/06/2022 NEGATIVE  NEGATIVE Final   Comment: (NOTE) SARS-CoV-2 target nucleic acids are NOT DETECTED.  The SARS-CoV-2 RNA is generally detectable in upper respiratory specimens during the acute phase of infection. The lowest concentration of SARS-CoV-2 viral copies this assay can detect is 138 copies/mL. A negative result does not preclude SARS-Cov-2 infection and should not be used as the sole basis for treatment or other patient management decisions. A negative result may occur with  improper specimen collection/handling, submission of specimen other than nasopharyngeal swab, presence of viral mutation(s) within the areas targeted by this assay, and inadequate number of viral copies(<138 copies/mL). A negative result must be combined with clinical observations, patient history, and epidemiological information. The expected result is Negative.  Fact Sheet for Patients:  EntrepreneurPulse.com.au  Fact Sheet for  Healthcare Providers:  IncredibleEmployment.be  This test is no  t yet approved or cleared by the [place] and  has been authorized for detection and/or diagnosis of SARS-CoV-2 by FDA under an Emergency Use Authorization (EUA). This EUA will remain  in effect (meaning this test can be used) for the duration of the COVID-19 declaration under Section 564(b)(1) of the Act, 21 U.S.C.section 360bbb-3(b)(1), unless the authorization is terminated  or revoked sooner.       Influenza A by PCR 07/06/2022 NEGATIVE  NEGATIVE Final   Influenza B by PCR 07/06/2022 NEGATIVE  NEGATIVE Final   Comment: (NOTE) The Xpert Xpress SARS-CoV-2/FLU/RSV plus assay is intended as an aid in the diagnosis of influenza from Nasopharyngeal swab specimens and should not be used as a sole basis for treatment. Nasal washings and aspirates are unacceptable for Xpert Xpress SARS-CoV-2/FLU/RSV testing.  Fact Sheet for Patients: EntrepreneurPulse.com.au  Fact Sheet for Healthcare Providers: IncredibleEmployment.be  This test is not yet approved or cleared by the [place] FDA and has been authorized for detection and/or diagnosis of SARS-CoV-2 by FDA under an Emergency Use Authorization (EUA). This EUA will remain in effect (meaning this test can be used) for the duration of the COVID-19 declaration under Section 564(b)(1) of the Act, 21 U.S.C. section 360bbb-3(b)(1), unless the authorization is terminated or revoked.  Performed at [facility], [place] [address] [place], [place] [address]    WBC 07/06/2022 10.2  4.0 - 10.5 K/uL Final   RBC 07/06/2022 4.50  4.22 - 5.81 MIL/uL Final   Hemoglobin 07/06/2022 13.4  13.0 - 17.0 g/dL Final   HCT 07/06/2022 39.3  39.0 - 52.0 % Final   MCV 07/06/2022 87.3  80.0 - 100.0 fL Final   MCH 07/06/2022 29.8  26.0 - 34.0 pg Final   MCHC 07/06/2022 34.1  30.0 - 36.0 g/dL Final    RDW 07/06/2022 13.2  11.5 - 15.5 % Final   Platelets 07/06/2022 273  150 - 400 K/uL Final   nRBC 07/06/2022 0.0  0.0 - 0.2 % Final   Neutrophils Relative % 07/06/2022 68  % Final   Neutro Abs 07/06/2022 6.9  1.7 - 7.7 K/uL Final   Lymphocytes Relative 07/06/2022 20  % Final   Lymphs Abs 07/06/2022 2.0  0.7 - 4.0 K/uL Final   Monocytes Relative 07/06/2022 10  % Final   Monocytes Absolute 07/06/2022 1.0  0.1 - 1.0 K/uL Final   Eosinophils Relative 07/06/2022 1  % Final   Eosinophils Absolute 07/06/2022 0.1  0.0 - 0.5 K/uL Final   Basophils Relative 07/06/2022 1  % Final   Basophils Absolute 07/06/2022 0.1  0.0 - 0.1 K/uL Final   Immature Granulocytes 07/06/2022 0  % Final   Abs Immature Granulocytes 07/06/2022 0.03  0.00 - 0.07 K/uL Final   Performed at [facility], [place] [address], [place], [place] [address]   Sodium 07/06/2022 137  135 - 145 mmol/L Final   Potassium 07/06/2022 4.0  3.5 - 5.1 mmol/L Final   Chloride 07/06/2022 102  98 - 111 mmol/L Final   CO2 07/06/2022 25  22 - 32 mmol/L Final   Glucose, Bld 07/06/2022 83  70 - 99 mg/dL Final   Glucose reference range applies only to samples taken after fasting for at least 8 hours.   BUN 07/06/2022 16  6 - 20 mg/dL Final   Creatinine, Ser 07/06/2022 1.18  0.61 - 1.24 mg/dL Final   Calcium 07/06/2022 9.6  8.9 - 10.3 mg/dL Final   Total Protein  07/06/2022 7.2  6.5 - 8.1 g/dL Final   Albumin 07/06/2022 3.9  3.5 - 5.0 g/dL Final   AST 07/06/2022 72 (H)  15 - 41 U/L Final   ALT 07/06/2022 60 (H)  0 - 44 U/L Final   Alkaline Phosphatase 07/06/2022 82  38 - 126 U/L Final   Total Bilirubin 07/06/2022 0.6  0.3 - 1.2 mg/dL Final   GFR, Estimated 07/06/2022 >60  >60 mL/min Final   Comment: (NOTE) Calculated using the CKD-EPI Creatinine Equation (2021)    Anion gap 07/06/2022 10  5 - 15 Final   Performed at [facility], [place] [address] [place], [place] [address]   Alcohol, Ethyl (B) 07/06/2022 <10  <10 mg/dL Final   Comment:  (NOTE) Lowest detectable limit for serum alcohol is 10 mg/dL.  For medical purposes only. Performed at [facility], [place] [address], [place], [place] [address]    POC Amphetamine UR 07/06/2022 Positive (PE)  NONE DETECTED (Cut Off Level 1000 ng/mL) Final   POC Secobarbital (BAR) 07/06/2022 None Detected  NONE DETECTED (Cut Off Level 300 ng/mL) Final   POC Buprenorphine (BUP) 07/06/2022 Positive (PE)  NONE DETECTED (Cut Off Level 10 ng/mL) Final   POC Oxazepam (BZO) 07/06/2022 None Detected  NONE DETECTED (Cut Off Level 300 ng/mL) Final   POC Cocaine UR 07/06/2022 None Detected  NONE DETECTED (Cut Off Level 300 ng/mL) Final   POC Methamphetamine UR 07/06/2022 Positive (PE)  NONE DETECTED (Cut Off Level 1000 ng/mL) Final   POC Morphine 07/06/2022 None Detected  NONE DETECTED (Cut Off Level 300 ng/mL) Final   POC Methadone UR 07/06/2022 None Detected  NONE DETECTED (Cut Off Level 300 ng/mL) Final   POC Oxycodone UR 07/06/2022 None Detected  NONE DETECTED (Cut Off Level 100 ng/mL) Final   POC Marijuana UR 07/06/2022 None Detected  NONE DETECTED (Cut Off Level 50 ng/mL) Final   SARSCOV2ONAVIRUS 2 AG 07/06/2022 NEGATIVE  NEGATIVE Final   Comment: (NOTE) SARS-CoV-2 antigen NOT DETECTED.   Negative results are presumptive.  Negative results do not preclude SARS-CoV-2 infection and should not be used as the sole basis for treatment or other patient management decisions, including infection  control decisions, particularly in the presence of clinical signs and  symptoms consistent with COVID-19, or in those who have been in contact with the virus.  Negative results must be combined with clinical observations, patient history, and epidemiological information. The expected result is Negative.  Fact Sheet for Patients: HandmadeRecipes.com.cy  Fact Sheet for Healthcare Providers: FuneralLife.at  This test is not yet approved or cleared by the  [place] FDA and  has been authorized for detection and/or diagnosis of SARS-CoV-2 by FDA under an Emergency Use Authorization (EUA).  This EUA will remain in effect (meaning this test can be used) for the duration of  the COV                          ID-19 declaration under Section 564(b)(1) of the Act, 21 U.S.C. section 360bbb-3(b)(1), unless the authorization is terminated or revoked sooner.     Cholesterol 07/06/2022 137  0 - 200 mg/dL Final   Triglycerides 07/06/2022 56  <150 mg/dL Final   HDL 07/06/2022 45  >40 mg/dL Final   Total CHOL/HDL Ratio 07/06/2022 3.0  RATIO Final   VLDL 07/06/2022 11  0 - 40 mg/dL Final   LDL Cholesterol 07/06/2022 81  0 - 99 mg/dL Final  Comment:        Total Cholesterol/HDL:CHD Risk Coronary Heart Disease Risk Table                     Men   Women  1/2 Average Risk   3.4   3.3  Average Risk       5.0   4.4  2 X Average Risk   9.6   7.1  3 X Average Risk  23.4   11.0        Use the calculated Patient Ratio above and the CHD Risk Table to determine the patient's CHD Risk.        ATP III CLASSIFICATION (LDL):  <100     mg/dL   Optimal  100-129  mg/dL   Near or Above                    Optimal  130-159  mg/dL   Borderline  160-189  mg/dL   High  >190     mg/dL   Very High Performed at [place] [address] [place], [place] [address]    TSH 07/06/2022 1.168  0.350 - 4.500 uIU/mL Final   Comment: Performed by a 3rd Generation assay with a functional sensitivity of <=0.01 uIU/mL. Performed at [facility], [place] [address], [place], [place] [address]   Admission on 07/03/2022, Discharged on 07/03/2022  Component Date Value Ref Range Status   SARS Coronavirus 2 by RT PCR 07/03/2022 NEGATIVE  NEGATIVE Final   Comment: (NOTE) SARS-CoV-2 target nucleic acids are NOT DETECTED.  The SARS-CoV-2 RNA is generally detectable in upper respiratory specimens during the acute phase of infection. The lowest concentration of  SARS-CoV-2 viral copies this assay can detect is 138 copies/mL. A negative result does not preclude SARS-Cov-2 infection and should not be used as the sole basis for treatment or other patient management decisions. A negative result may occur with  improper specimen collection/handling, submission of specimen other than nasopharyngeal swab, presence of viral mutation(s) within the areas targeted by this assay, and inadequate number of viral copies(<138 copies/mL). A negative result must be combined with clinical observations, patient history, and epidemiological information. The expected result is Negative.  Fact Sheet for Patients:  EntrepreneurPulse.com.au  Fact Sheet for Healthcare Providers:  IncredibleEmployment.be  This test is no                          t yet approved or cleared by the [place] FDA and  has been authorized for detection and/or diagnosis of SARS-CoV-2 by FDA under an Emergency Use Authorization (EUA). This EUA will remain  in effect (meaning this test can be used) for the duration of the COVID-19 declaration under Section 564(b)(1) of the Act, 21 U.S.C.section 360bbb-3(b)(1), unless the authorization is terminated  or revoked sooner.       Influenza A by PCR 07/03/2022 NEGATIVE  NEGATIVE Final   Influenza B by PCR 07/03/2022 NEGATIVE  NEGATIVE Final   Comment: (NOTE) The Xpert Xpress SARS-CoV-2/FLU/RSV plus assay is intended as an aid in the diagnosis of influenza from Nasopharyngeal swab specimens and should not be used as a sole basis for treatment. Nasal washings and aspirates are unacceptable for Xpert Xpress SARS-CoV-2/FLU/RSV testing.  Fact Sheet for Patients: EntrepreneurPulse.com.au  Fact Sheet for Healthcare Providers: IncredibleEmployment.be  This test is not yet approved or cleared by the [place] FDA and has been  authorized for detection and/or diagnosis of  SARS-CoV-2 by FDA under an Emergency Use Authorization (EUA). This EUA will remain in effect (meaning this test can be used) for the duration of the COVID-19 declaration under Section 564(b)(1) of the Act, 21 U.S.C. section 360bbb-3(b)(1), unless the authorization is terminated or revoked.  Performed at [facility], [place] [address] [place], [place] [address]    Alcohol, Ethyl (B) 07/03/2022 <10  <10 mg/dL Final   Comment: (NOTE) Lowest detectable limit for serum alcohol is 10 mg/dL.  For medical purposes only. Performed at [facility], [place] [address], [place], [place] [address]    Opiates 07/03/2022 NONE DETECTED  NONE DETECTED Final   Cocaine 07/03/2022 POSITIVE (A)  NONE DETECTED Final   Benzodiazepines 07/03/2022 NONE DETECTED  NONE DETECTED Final   Amphetamines 07/03/2022 POSITIVE (A)  NONE DETECTED Final   Tetrahydrocannabinol 07/03/2022 NONE DETECTED  NONE DETECTED Final   Barbiturates 07/03/2022 NONE DETECTED  NONE DETECTED Final   Comment: (NOTE) DRUG SCREEN FOR MEDICAL PURPOSES ONLY.  IF CONFIRMATION IS NEEDED FOR ANY PURPOSE, NOTIFY LAB WITHIN 5 DAYS.  LOWEST DETECTABLE LIMITS FOR URINE DRUG SCREEN Drug Class                     Cutoff (ng/mL) Amphetamine and metabolites    1000 Barbiturate and metabolites    200 Benzodiazepine                 376 Tricyclics and metabolites     300 Opiates and metabolites        300 Cocaine and metabolites        300 THC                            50 Performed at [facility], [place] [address] [place], [place] [address]   Admission on 04/11/2022, Discharged on 04/14/2022  Component Date Value Ref Range Status   SARS Coronavirus 2 by RT PCR 04/11/2022 NEGATIVE  NEGATIVE Final   Comment: (NOTE) SARS-CoV-2 target nucleic acids are NOT DETECTED.  The SARS-CoV-2 RNA is generally detectable in upper respiratory specimens during the acute phase of infection. The  lowest concentration of SARS-CoV-2 viral copies this assay can detect is 138 copies/mL. A negative result does not preclude SARS-Cov-2 infection and should not be used as the sole basis for treatment or other patient management decisions. A negative result may occur with  improper specimen collection/handling, submission of specimen other than nasopharyngeal swab, presence of viral mutation(s) within the areas targeted by this assay, and inadequate number of viral copies(<138 copies/mL). A negative result must be combined with clinical observations, patient history, and epidemiological information. The expected result is Negative.  Fact Sheet for Patients:  EntrepreneurPulse.com.au  Fact Sheet for Healthcare Providers:  IncredibleEmployment.be  This test is no                          t yet approved or cleared by the [place] FDA and  has been authorized for detection and/or diagnosis of SARS-CoV-2 by FDA under an Emergency Use Authorization (EUA). This EUA will remain  in effect (meaning this test can be used) for the duration of the COVID-19 declaration under Section 564(b)(1) of the Act, 21 U.S.C.section 360bbb-3(b)(1), unless the authorization is terminated  or revoked sooner.       Influenza A by PCR  04/11/2022 NEGATIVE  NEGATIVE Final   Influenza B by PCR 04/11/2022 NEGATIVE  NEGATIVE Final   Comment: (NOTE) The Xpert Xpress SARS-CoV-2/FLU/RSV plus assay is intended as an aid in the diagnosis of influenza from Nasopharyngeal swab specimens and should not be used as a sole basis for treatment. Nasal washings and aspirates are unacceptable for Xpert Xpress SARS-CoV-2/FLU/RSV testing.  Fact Sheet for Patients: EntrepreneurPulse.com.au  Fact Sheet for Healthcare Providers: IncredibleEmployment.be  This test is not yet approved or cleared by the [place] FDA and has been authorized for  detection and/or diagnosis of SARS-CoV-2 by FDA under an Emergency Use Authorization (EUA). This EUA will remain in effect (meaning this test can be used) for the duration of the COVID-19 declaration under Section 564(b)(1) of the Act, 21 U.S.C. section 360bbb-3(b)(1), unless the authorization is terminated or revoked.  Performed at [facility], [place] [address], [place], [place] [address]    WBC 04/11/2022 13.9 (H)  4.0 - 10.5 K/uL Final   RBC 04/11/2022 4.37  4.22 - 5.81 MIL/uL Final   Hemoglobin 04/11/2022 13.0  13.0 - 17.0 g/dL Final   HCT 04/11/2022 40.0  39.0 - 52.0 % Final   MCV 04/11/2022 91.5  80.0 - 100.0 fL Final   MCH 04/11/2022 29.7  26.0 - 34.0 pg Final   MCHC 04/11/2022 32.5  30.0 - 36.0 g/dL Final   RDW 04/11/2022 15.1  11.5 - 15.5 % Final   Platelets 04/11/2022 322  150 - 400 K/uL Final   nRBC 04/11/2022 0.0  0.0 - 0.2 % Final   Neutrophils Relative % 04/11/2022 71  % Final   Neutro Abs 04/11/2022 9.9 (H)  1.7 - 7.7 K/uL Final   Lymphocytes Relative 04/11/2022 18  % Final   Lymphs Abs 04/11/2022 2.5  0.7 - 4.0 K/uL Final   Monocytes Relative 04/11/2022 8  % Final   Monocytes Absolute 04/11/2022 1.1 (H)  0.1 - 1.0 K/uL Final   Eosinophils Relative 04/11/2022 2  % Final   Eosinophils Absolute 04/11/2022 0.3  0.0 - 0.5 K/uL Final   Basophils Relative 04/11/2022 0  % Final   Basophils Absolute 04/11/2022 0.0  0.0 - 0.1 K/uL Final   Immature Granulocytes 04/11/2022 1  % Final   Abs Immature Granulocytes 04/11/2022 0.07  0.00 - 0.07 K/uL Final   Performed at [facility], [place] [address], [place], [place] [address]   Sodium 04/11/2022 138  135 - 145 mmol/L Final   Potassium 04/11/2022 4.3  3.5 - 5.1 mmol/L Final   Chloride 04/11/2022 104  98 - 111 mmol/L Final   CO2 04/11/2022 24  22 - 32 mmol/L Final   Glucose, Bld 04/11/2022 80  70 - 99 mg/dL Final   Glucose reference range applies only to samples taken after fasting for at least 8 hours.   BUN 04/11/2022  12  6 - 20 mg/dL Final   Creatinine, Ser 04/11/2022 1.17  0.61 - 1.24 mg/dL Final   Calcium 04/11/2022 9.2  8.9 - 10.3 mg/dL Final   Total Protein 04/11/2022 6.9  6.5 - 8.1 g/dL Final   Albumin 04/11/2022 3.9  3.5 - 5.0 g/dL Final   AST 04/11/2022 33  15 - 41 U/L Final   ALT 04/11/2022 47 (H)  0 - 44 U/L Final   Alkaline Phosphatase 04/11/2022 69  38 - 126 U/L Final   Total Bilirubin 04/11/2022 0.5  0.3 - 1.2 mg/dL Final   GFR, Estimated 04/11/2022 >60  >  60 mL/min Final   Comment: (NOTE) Calculated using the CKD-EPI Creatinine Equation (2021)    Anion gap 04/11/2022 10  5 - 15 Final   Performed at [facility], [place] [address] [place], [place] [address]   POC Amphetamine UR 04/11/2022 None Detected  NONE DETECTED (Cut Off Level 1000 ng/mL) Final   POC Secobarbital (BAR) 04/11/2022 None Detected  NONE DETECTED (Cut Off Level 300 ng/mL) Final   POC Buprenorphine (BUP) 04/11/2022 None Detected  NONE DETECTED (Cut Off Level 10 ng/mL) Final   POC Oxazepam (BZO) 04/11/2022 None Detected  NONE DETECTED (Cut Off Level 300 ng/mL) Final   POC Cocaine UR 04/11/2022 None Detected  NONE DETECTED (Cut Off Level 300 ng/mL) Final   POC Methamphetamine UR 04/11/2022 None Detected  NONE DETECTED (Cut Off Level 1000 ng/mL) Final   POC Morphine 04/11/2022 None Detected  NONE DETECTED (Cut Off Level 300 ng/mL) Final   POC Methadone UR 04/11/2022 None Detected  NONE DETECTED (Cut Off Level 300 ng/mL) Final   POC Oxycodone UR 04/11/2022 None Detected  NONE DETECTED (Cut Off Level 100 ng/mL) Final   POC Marijuana UR 04/11/2022 None Detected  NONE DETECTED (Cut Off Level 50 ng/mL) Final  Admission on 03/30/2022, Discharged on 04/01/2022  Component Date Value Ref Range Status   SARS Coronavirus 2 by RT PCR 03/30/2022 NEGATIVE  NEGATIVE Final   Comment: (NOTE) SARS-CoV-2 target nucleic acids are NOT DETECTED.  The SARS-CoV-2 RNA is generally detectable in upper respiratory specimens during the acute phase  of infection. The lowest concentration of SARS-CoV-2 viral copies this assay can detect is 138 copies/mL. A negative result does not preclude SARS-Cov-2 infection and should not be used as the sole basis for treatment or other patient management decisions. A negative result may occur with  improper specimen collection/handling, submission of specimen other than nasopharyngeal swab, presence of viral mutation(s) within the areas targeted by this assay, and inadequate number of viral copies(<138 copies/mL). A negative result must be combined with clinical observations, patient history, and epidemiological information. The expected result is Negative.  Fact Sheet for Patients:  EntrepreneurPulse.com.au  Fact Sheet for Healthcare Providers:  IncredibleEmployment.be  This test is no                          t yet approved or cleared by the [place] FDA and  has been authorized for detection and/or diagnosis of SARS-CoV-2 by FDA under an Emergency Use Authorization (EUA). This EUA will remain  in effect (meaning this test can be used) for the duration of the COVID-19 declaration under Section 564(b)(1) of the Act, 21 U.S.C.section 360bbb-3(b)(1), unless the authorization is terminated  or revoked sooner.       Influenza A by PCR 03/30/2022 NEGATIVE  NEGATIVE Final   Influenza B by PCR 03/30/2022 NEGATIVE  NEGATIVE Final   Comment: (NOTE) The Xpert Xpress SARS-CoV-2/FLU/RSV plus assay is intended as an aid in the diagnosis of influenza from Nasopharyngeal swab specimens and should not be used as a sole basis for treatment. Nasal washings and aspirates are unacceptable for Xpert Xpress SARS-CoV-2/FLU/RSV testing.  Fact Sheet for Patients: EntrepreneurPulse.com.au  Fact Sheet for Healthcare Providers: IncredibleEmployment.be  This test is not yet approved or cleared by the [place] FDA and has been  authorized for detection and/or diagnosis of SARS-CoV-2 by FDA under an Emergency Use Authorization (EUA). This EUA will remain in effect (meaning this test can be used)  for the duration of the COVID-19 declaration under Section 564(b)(1) of the Act, 21 U.S.C. section 360bbb-3(b)(1), unless the authorization is terminated or revoked.  Performed at [facility], [place] [address], [place], [place] [address]    POC Amphetamine UR 03/30/2022 Positive (A)  NONE DETECTED (Cut Off Level 1000 ng/mL) Final   POC Secobarbital (BAR) 03/30/2022 None Detected  NONE DETECTED (Cut Off Level 300 ng/mL) Final   POC Buprenorphine (BUP) 03/30/2022 None Detected  NONE DETECTED (Cut Off Level 10 ng/mL) Final   POC Oxazepam (BZO) 03/30/2022 None Detected  NONE DETECTED (Cut Off Level 300 ng/mL) Final   POC Cocaine UR 03/30/2022 None Detected  NONE DETECTED (Cut Off Level 300 ng/mL) Final   POC Methamphetamine UR 03/30/2022 Positive (A)  NONE DETECTED (Cut Off Level 1000 ng/mL) Final   POC Morphine 03/30/2022 None Detected  NONE DETECTED (Cut Off Level 300 ng/mL) Final   POC Methadone UR 03/30/2022 None Detected  NONE DETECTED (Cut Off Level 300 ng/mL) Final   POC Oxycodone UR 03/30/2022 None Detected  NONE DETECTED (Cut Off Level 100 ng/mL) Final   POC Marijuana UR 03/30/2022 Positive (A)  NONE DETECTED (Cut Off Level 50 ng/mL) Final   SARSCOV2ONAVIRUS 2 AG 03/30/2022 NEGATIVE  NEGATIVE Final   Comment: (NOTE) SARS-CoV-2 antigen NOT DETECTED.   Negative results are presumptive.  Negative results do not preclude SARS-CoV-2 infection and should not be used as the sole basis for treatment or other patient management decisions, including infection  control decisions, particularly in the presence of clinical signs and  symptoms consistent with COVID-19, or in those who have been in contact with the virus.  Negative results must be combined with clinical observations, patient history, and  epidemiological information. The expected result is Negative.  Fact Sheet for Patients: HandmadeRecipes.com.cy  Fact Sheet for Healthcare Providers: FuneralLife.at  This test is not yet approved or cleared by the [place] FDA and  has been authorized for detection and/or diagnosis of SARS-CoV-2 by FDA under an Emergency Use Authorization (EUA).  This EUA will remain in effect (meaning this test can be used) for the duration of  the COV                          ID-19 declaration under Section 564(b)(1) of the Act, 21 U.S.C. section 360bbb-3(b)(1), unless the authorization is terminated or revoked sooner.    Admission on 03/29/2022, Discharged on 03/30/2022  Component Date Value Ref Range Status   Sodium 03/29/2022 142  135 - 145 mmol/L Final   Potassium 03/29/2022 3.5  3.5 - 5.1 mmol/L Final   Chloride 03/29/2022 110  98 - 111 mmol/L Final   CO2 03/29/2022 24  22 - 32 mmol/L Final   Glucose, Bld 03/29/2022 108 (H)  70 - 99 mg/dL Final   Glucose reference range applies only to samples taken after fasting for at least 8 hours.   BUN 03/29/2022 13  6 - 20 mg/dL Final   Creatinine, Ser 03/29/2022 1.19  0.61 - 1.24 mg/dL Final   Calcium 03/29/2022 9.2  8.9 - 10.3 mg/dL Final   Total Protein 03/29/2022 7.7  6.5 - 8.1 g/dL Final   Albumin 03/29/2022 4.2  3.5 - 5.0 g/dL Final   AST 03/29/2022 61 (H)  15 - 41 U/L Final   ALT 03/29/2022 48 (H)  0 - 44 U/L Final   Alkaline Phosphatase 03/29/2022 64  38 - 126 U/L Final  Total Bilirubin 03/29/2022 0.9  0.3 - 1.2 mg/dL Final   GFR, Estimated 03/29/2022 >60  >60 mL/min Final   Comment: (NOTE) Calculated using the CKD-EPI Creatinine Equation (2021)    Anion gap 03/29/2022 8  5 - 15 Final   Performed at [facility] [place], [place] [address] [place], [place] [address]   Alcohol, Ethyl (B) 03/29/2022 <10  <10 mg/dL Final   Comment: (NOTE) Lowest detectable limit for serum alcohol  is 10 mg/dL.  For medical purposes only. Performed at [facility], [place] [address], [place], [place] [address]    Salicylate Lvl 26/37/8588 <7.0 (L)  7.0 - 30.0 mg/dL Final   Performed at [place] [address], [place], [place] [address]   Acetaminophen (Tylenol), Serum 03/29/2022 <10 (L)  10 - 30 ug/mL Final   Comment: (NOTE) Therapeutic concentrations vary significantly. A range of 10-30 ug/mL  may be an effective concentration for many patients. However, some  are best treated at concentrations outside of this range. Acetaminophen concentrations >150 ug/mL at 4 hours after ingestion  and >50 ug/mL at 12 hours after ingestion are often associated with  toxic reactions.  Performed at [facility], [place] [address] [place], [place] [address]    WBC 03/29/2022 7.5  4.0 - 10.5 K/uL Final   RBC 03/29/2022 4.21 (L)  4.22 - 5.81 MIL/uL Final   Hemoglobin 03/29/2022 12.2 (L)  13.0 - 17.0 g/dL Final   HCT 03/29/2022 38.1 (L)  39.0 - 52.0 % Final   MCV 03/29/2022 90.5  80.0 - 100.0 fL Final   MCH 03/29/2022 29.0  26.0 - 34.0 pg Final   MCHC 03/29/2022 32.0  30.0 - 36.0 g/dL Final   RDW 03/29/2022 15.3  11.5 - 15.5 % Final   Platelets 03/29/2022 228  150 - 400 K/uL Final   nRBC 03/29/2022 0.0  0.0 - 0.2 % Final   Performed at [facility], [place] [address] [place], [place] [address]   Opiates 03/29/2022 NONE DETECTED  NONE DETECTED Final   Cocaine 03/29/2022 NONE DETECTED  NONE DETECTED Final   Benzodiazepines 03/29/2022 NONE DETECTED  NONE DETECTED Final   Amphetamines 03/29/2022 POSITIVE (A)  NONE DETECTED Final   Tetrahydrocannabinol 03/29/2022 POSITIVE (A)  NONE DETECTED Final   Barbiturates 03/29/2022 NONE DETECTED  NONE DETECTED Final   Comment: (NOTE) DRUG SCREEN FOR MEDICAL PURPOSES ONLY.  IF CONFIRMATION IS NEEDED FOR ANY PURPOSE, NOTIFY LAB WITHIN 5 DAYS.  LOWEST DETECTABLE LIMITS FOR URINE  DRUG SCREEN Drug Class                     Cutoff (ng/mL) Amphetamine and metabolites    1000 Barbiturate and metabolites    200 Benzodiazepine                 209 Tricyclics and metabolites     300 Opiates and metabolites        300 Cocaine and metabolites        300 THC                            50 Performed at [facility], [place] [address] [place], [place] [address]    Troponin I (High Sensitivity) 03/29/2022 3  <18 ng/L Final   Comment: (NOTE) Elevated high sensitivity troponin I (hsTnI) values and significant  changes across serial measurements may suggest ACS  but many other  chronic and acute conditions are known to elevate hsTnI results.  Refer to the "Links" section for chest pain algorithms and additional  guidance. Performed at [facility], [place] [address] [place], [place] [address]    SARS Coronavirus 2 by RT PCR 03/29/2022 NEGATIVE  NEGATIVE Final   Comment: (NOTE) SARS-CoV-2 target nucleic acids are NOT DETECTED.  The SARS-CoV-2 RNA is generally detectable in upper and lower respiratory specimens during the acute phase of infection. The lowest concentration of SARS-CoV-2 viral copies this assay can detect is 250 copies / mL. A negative result does not preclude SARS-CoV-2 infection and should not be used as the sole basis for treatment or other patient management decisions.  A negative result may occur with improper specimen collection / handling, submission of specimen other than nasopharyngeal swab, presence of viral mutation(s) within the areas targeted by this assay, and inadequate number of viral copies (<250 copies / mL). A negative result must be combined with clinical observations, patient history, and epidemiological information.  Fact Sheet for Patients:   https://www.patel.info/  Fact Sheet for Healthcare Providers: https://hall.com/  This test is not yet approved or                            cleared by the [place] FDA and has been authorized for detection and/or diagnosis of SARS-CoV-2 by FDA under an Emergency Use Authorization (EUA).  This EUA will remain in effect (meaning this test can be used) for the duration of the COVID-19 declaration under Section 564(b)(1) of the Act, 21 U.S.C. section 360bbb-3(b)(1), unless the authorization is terminated or revoked sooner.  Performed at [facility], [place] [address], [place] [place], [place] [address]    SARS Coronavirus 2 by RT PCR 03/30/2022 NEGATIVE  NEGATIVE Final   Comment: (NOTE) SARS-CoV-2 target nucleic acids are NOT DETECTED.  The SARS-CoV-2 RNA is generally detectable in upper respiratory specimens during the acute phase of infection. The lowest concentration of SARS-CoV-2 viral copies this assay can detect is 138 copies/mL. A negative result does not preclude SARS-Cov-2 infection and should not be used as the sole basis for treatment or other patient management decisions. A negative result may occur with  improper specimen collection/handling, submission of specimen other than nasopharyngeal swab, presence of viral mutation(s) within the areas targeted by this assay, and inadequate number of viral copies(<138 copies/mL). A negative result must be combined with clinical observations, patient history, and epidemiological information. The expected result is Negative.  Fact Sheet for Patients:  EntrepreneurPulse.com.au  Fact Sheet for Healthcare Providers:  IncredibleEmployment.be  This test is no                          t yet approved or cleared by the [place] FDA and  has been authorized for detection and/or diagnosis of SARS-CoV-2 by FDA under an Emergency Use Authorization (EUA). This EUA will remain  in effect (meaning this test can be used) for the duration of the COVID-19 declaration under Section 564(b)(1) of the Act,  21 U.S.C.section 360bbb-3(b)(1), unless the authorization is terminated  or revoked sooner.       Influenza A by PCR 03/30/2022 NEGATIVE  NEGATIVE Final   Influenza B by PCR 03/30/2022 NEGATIVE  NEGATIVE Final   Comment: (NOTE) The Xpert Xpress SARS-CoV-2/FLU/RSV plus assay is intended as an aid in the diagnosis of influenza from Nasopharyngeal  swab specimens and should not be used as a sole basis for treatment. Nasal washings and aspirates are unacceptable for Xpert Xpress SARS-CoV-2/FLU/RSV testing.  Fact Sheet for Patients: EntrepreneurPulse.com.au  Fact Sheet for Healthcare Providers: IncredibleEmployment.be  This test is not yet approved or cleared by the [place] FDA and has been authorized for detection and/or diagnosis of SARS-CoV-2 by FDA under an Emergency Use Authorization (EUA). This EUA will remain in effect (meaning this test can be used) for the duration of the COVID-19 declaration under Section 564(b)(1) of the Act, 21 U.S.C. section 360bbb-3(b)(1), unless the authorization is terminated or revoked.  Performed at [facility], [place] [address], [place], [place] [address]     Blood Alcohol level:  Lab Results  Component Value Date   ETH <10 08/09/2022   ETH <10 10/93/2355    Metabolic Disorder Labs: Lab Results  Component Value Date   HGBA1C 5.7 (H) 08/09/2022   MPG 116.89 08/09/2022   MPG 119.76 01/27/2021   No results found for: "PROLACTIN" Lab Results  Component Value Date   CHOL 139 08/09/2022   TRIG 33 08/09/2022   HDL 61 08/09/2022   CHOLHDL 2.3 08/09/2022   VLDL 7 08/09/2022   LDLCALC 71 08/09/2022   LDLCALC 81 07/06/2022    Therapeutic Lab Levels: No results found for: "LITHIUM" No results found for: "VALPROATE" No results found for: "CBMZ"  Physical Findings   AIMS    Flowsheet Row Admission (Discharged) from 12/21/2020 in [facility]  Admission (Discharged) from 03/25/2018 in [facility]  AIMS Total Score 0 0      AUDIT    Flowsheet Row Admission (Discharged) from 12/21/2020 in [facility] ED from [facility]02/2020 in [facility] Admission (Discharged) from 03/25/2018 in [facility]  Alcohol Use Disorder Identification Test Final Score (AUDIT) 0 4 0      PHQ2-9    Flowsheet Row ED from 08/09/2022 in [facility] ED from 07/06/2022 in [facility] ED from 04/11/2022 in [facility] ED from 03/30/2022 in [facility] ED from 05/02/2020 in [facility] DEPT  PHQ-2 Total Score _0 PHQ-9 Total Score _1 Flowsheet Row ED from 08/09/2022 in [facility] ED from 08/01/2022 in [facility] ED from 07/06/2022 in [facility] No Risk No Risk No Risk        Musculoskeletal  Strength & Muscle Tone: within normal limits Gait & Station: normal Patient leans: N/A  Psychiatric Specialty Exam  Presentation  General Appearance:  Appropriate for Environment  Eye Contact: Poor  Speech: Clear and Coherent  Speech Volume: Normal  Handedness: Right   Mood and Affect  Mood: "Fair"  Affect: Congruent   Thought Process  Thought Processes: Coherent; Goal Directed  Descriptions of Associations:Intact  Orientation:Full (Time, Place and Person)  Thought Content:Logical  Diagnosis of Schizophrenia or Schizoaffective disorder in past: No    Hallucinations:Hallucinations: None  Ideas of Reference:None  Suicidal Thoughts:Suicidal Thoughts: No  Homicidal Thoughts:Homicidal Thoughts: No   Sensorium  Memory: Immediate Fair;  Recent Fair  Judgment: Fair  Insight: Fair   Executive Functions  Concentration: Poor  Attention Span: Poor  Recall: [employer]  of Knowledge: Fair  Language: Fair   Engineer, water Activity: Psychomotor Activity: Normal   Assets  Assets: Desire for Improvement; Resilience   Sleep  Sleep: Sleep: Good   No data recorded   Physical Exam  Physical Exam Constitutional:      Appearance: the patient is not toxic-appearing.  Pulmonary:     Effort: Pulmonary effort is normal.  Neurological:     General: No focal deficit present.     Mental Status: the patient is alert and oriented to person, place, and time.   Review of Systems  Respiratory:  Negative for shortness of breath.   Cardiovascular:  Negative for chest pain.  Gastrointestinal:  Negative for abdominal pain, constipation, diarrhea, nausea and vomiting.  Neurological:  Negative for headaches.    Blood pressure 115/73, pulse 78, temperature 97.9 F (36.6 C), temperature source Tympanic, resp. rate 20, SpO2 100 %. There is no height or weight on file to calculate BMI.  Treatment Plan Summary: Daily contact with patient to assess and evaluate symptoms and progress in treatment and Medication management  GAD: -Continue Wellbutrin 150 mg daily -Increase Seroquel to 200 mg nightly - Increase gabapentin to 300 mg 3 times daily   Withdrawal: -Continue CIWA -Continue Ativan 1 mg q6 PRN CIWA>10 or Seizure -Continue Imodium 2-4 mg PRN diarrhea -Continue Robaxin 500 mg q8 PRN muscle spasms -Continue Zofran-ODT 4 mg q6 PRN nausea -Continue Thiamine 100 mg daily for nutritional supplementation -Continue Multivitamin daily for nutritional supplementation   -Continue Neosporin PRN to wound on toe -Increase Lipitor to home dose of 80 mg daily -Continue PRN's: Tylenol, Maalox, Atarax, Milk of Magnesia   Corky Sox, MD 08/11/2022 5:37 PM

## 2022-08-11 NOTE — ED Notes (Signed)
Pt resting quietly.  Denies pain, SI, HI, and AVH.  Breathing is even and unlabored. Will continue to monitor for safety.

## 2022-08-11 NOTE — ED Notes (Signed)
Pt is A & O x 4. Pt is in the dayroom watching TV with peers. Respirations are even and unlabored, no distress noted. Will continue to monitor.

## 2022-08-11 NOTE — ED Notes (Signed)
Pt asleep in bed. Respirations even and unlabored. Monitoring for safety. 

## 2022-08-11 NOTE — ED Notes (Signed)
Pt reported of back pain of 5 on a scale of 0-10 and anxiety. Pt requested for medication to help with pain and anxiety.

## 2022-08-11 NOTE — BH IP Treatment Plan (Signed)
Interdisciplinary Treatment and Diagnostic Plan Update  08/11/2022 Time of Session: 1043am Brian [name] MRN: [id number]  Diagnosis:  Final diagnoses:  Polysubstance dependence including opioid type drug, episodic abuse ([place])  Housing instability after recent homelessness     Current Medications:  Current Facility-Administered Medications  Medication Dose Route Frequency Provider Last Rate Last Admin   acetaminophen (TYLENOL) tablet 650 mg  650 mg Oral Q6H PRN Onuoha, Chinwendu V, NP   650 mg at 08/09/22 0255   alum & mag hydroxide-simeth (MAALOX/MYLANTA) 200-200-20 MG/5ML suspension 30 mL  30 mL Oral Q4H PRN Onuoha, Chinwendu V, NP       atorvastatin (LIPITOR) tablet 20 mg  20 mg Oral Daily Derrill Center, NP   20 mg at 08/11/22 6283   buPROPion (WELLBUTRIN XL) 24 hr tablet 150 mg  150 mg Oral Daily Derrill Center, NP   150 mg at 08/11/22 6629   dicyclomine (BENTYL) tablet 20 mg  20 mg Oral Q6H PRN Onuoha, Chinwendu V, NP       gabapentin (NEURONTIN) tablet 300 mg  300 mg Oral BID Derrill Center, NP   300 mg at 08/11/22 4765   hydrOXYzine (ATARAX) tablet 25 mg  25 mg Oral Q6H PRN Onuoha, Chinwendu V, NP   25 mg at 08/10/22 1957   loperamide (IMODIUM) capsule 2-4 mg  2-4 mg Oral PRN Onuoha, Chinwendu V, NP       LORazepam (ATIVAN) tablet 1 mg  1 mg Oral Q6H PRN Pashayan, Redgie Grayer, MD       Or   LORazepam (ATIVAN) injection 1 mg  1 mg Intramuscular Q6H PRN Pashayan, Redgie Grayer, MD       magnesium hydroxide (MILK OF MAGNESIA) suspension 30 mL  30 mL Oral Daily PRN Onuoha, Chinwendu V, NP       methocarbamol (ROBAXIN) tablet 500 mg  500 mg Oral Q8H PRN Onuoha, Chinwendu V, NP   500 mg at 08/10/22 1957   neomycin-bacitracin-polymyxin (NEOSPORIN) ointment   Topical PRN Nwoko, Uchenna E, PA       ondansetron (ZOFRAN-ODT) disintegrating tablet 4 mg  4 mg Oral Q6H PRN Onuoha, Chinwendu V, NP       QUEtiapine (SEROQUEL) tablet 100 mg  100 mg Oral QHS [name], MD   100  mg at 08/10/22 2144   thiamine (VITAMIN B1) tablet 100 mg  100 mg Oral Daily [name], MD   100 mg at 08/11/22 4650   Current Outpatient Medications  Medication Sig Dispense Refill   buPROPion (WELLBUTRIN XL) 300 MG 24 hr tablet Take 1 tablet (300 mg total) by mouth daily for 7 days. 7 tablet 0   gabapentin (NEURONTIN) 300 MG capsule Take 300 mg by mouth 3 (three) times daily.     hydrOXYzine (ATARAX) 25 MG tablet Take 1 tablet (25 mg total) by mouth 3 (three) times daily as needed for anxiety. 90 tablet 1   LIPITOR 80 MG tablet Take 1 tablet (80 mg total) by mouth at bedtime. 30 tablet 1   QUEtiapine (SEROQUEL) 200 MG tablet Take 200 mg by mouth at bedtime.     PTA Medications: Prior to Admission medications   Medication Sig Start Date End Date Taking? Authorizing Provider  buPROPion (WELLBUTRIN XL) 300 MG 24 hr tablet Take 1 tablet (300 mg total) by mouth daily for 7 days. 08/03/22 08/10/22 Yes Kingsley, Jordan Hawks K, DO  gabapentin (NEURONTIN) 300 MG capsule Take 300 mg by mouth 3 (three) times  daily.   Yes [provider]  hydrOXYzine (ATARAX) 25 MG tablet Take 1 tablet (25 mg total) by mouth 3 (three) times daily as needed for anxiety. 05/06/22  Yes Mayers, Cari S, PA-C  LIPITOR 80 MG tablet Take 1 tablet (80 mg total) by mouth at bedtime. 05/06/22  Yes Mayers, Cari S, PA-C  QUEtiapine (SEROQUEL) 200 MG tablet Take 200 mg by mouth at bedtime.   Yes [provider]  FLUoxetine (PROZAC) 20 MG capsule Take 1 capsule (20 mg total) by mouth daily. For depression Patient not taking: Reported on 11/04/2019 04/01/18 05/04/20  Lindell Spar I, NP  sucralfate (CARAFATE) 1 g tablet Take 1 tablet (1 g total) by mouth 4 (four) times daily -  with meals and at bedtime. Patient not taking: Reported on 11/04/2019 08/30/19 05/04/20  Lorayne Bender, PA-C    Patient Stressors: Financial difficulties   Medication change or noncompliance   Occupational concerns   Substance abuse   Temporary  loss of employment until treatment is received per patient  Patient Strengths: Ability for insight  Average or above average intelligence  Capable of independent living  Communication skills  Work skills   Treatment Modalities: Medication Management, Group therapy, Case management,  1 to 1 session with clinician, Psychoeducation, Recreational therapy.   Physician Treatment Plan for Primary and Secondary Diagnosis:  Final diagnoses:  Polysubstance dependence including opioid type drug, episodic abuse ([place])  Housing instability after recent homelessness   Long Term Goal(s): Improvement in symptoms so as ready for discharge  Short Term Goals: Patient will verbalize feelings in meetings with treatment team members.  Medication Management: Evaluate patient's response, side effects, and tolerance of medication regimen.  Therapeutic Interventions: 1 to 1 sessions, Unit Group sessions and Medication administration.  Evaluation of Outcomes: Progressing  LCSW Treatment Plan for Primary Diagnosis:  Final diagnoses:  Polysubstance dependence including opioid type drug, episodic abuse ([place])  Housing instability after recent homelessness    Long Term Goal(s): Safe transition to appropriate next level of care at discharge.  Short Term Goals: Facilitate acceptance of mental health diagnosis and concerns through verbal commitment to aftercare plan and appointments at discharge., Patient will identify one social support prior to discharge to aid in patient's recovery., Patient will attend AA/NA groups as scheduled., Identify minimum of 2 triggers associated with mental health/substance abuse issues with treatment team members., and Increase skills for wellness and recovery by attending 50% of scheduled groups.  Therapeutic Interventions: Assess for all discharge needs, 1 to 1 time with Education officer, museum, Explore available resources and support systems, Assess for adequacy in community support network,  Educate family and significant other(s) on suicide prevention, Complete Psychosocial Assessment, Interpersonal group therapy.  Evaluation of Outcomes: Progressing   Progress in Treatment: Attending groups: Yes. Participating in groups: Yes. Taking medication as prescribed: Yes. Toleration medication: Yes. Family/Significant other contact made: No, will contact:  Patient declined collateral contact  Patient understands diagnosis: Yes. Discussing patient identified problems/goals with staff: Yes. Medical problems stabilized or resolved: Yes. Denies suicidal/homicidal ideation: Yes. Issues/concerns per patient self-inventory: Yes. Other: Patient was fixated on current Wellbutrin dosage stating " I am typically on 300mg  of Wellbutrin and I just don't understand why I am not given that dose right now".   New problem(s) identified: No, Describe:  none other than what was reported upon admission   New Short Term/Long Term Goal(s): Patient reports wanitng to receive the correct dosage of Wellbutrin, however was informed by MD of current  barriers. Patient reports an interest in receiving residential treatment for substance use upon discharge.   Patient Goals:  Patient reports an interest in receiving residential treatment for substance use upon discharge.   Discharge Plan or Barriers: LCSW will send referrals out for residential placement upon discharge. Patient reported to this Clinician that he is unable to afford a copay at this time. Patient aware that LCSW will follow up to provide updates regarding agency requirements. Patient aware that referrals will be sent, however if patient is denied then the follow up plan will be for either intensive outpatient treatment for substance use or the continuation of outpatient follow up for medication management and therapy via [facility] of the [place].   Reason for Continuation of Hospitalization: Medication stabilization  Estimated Length of  Stay: 3-5 days   Last 3 [place] Suicide Severity Risk Score: [facility] from 08/09/2022 in [facility] ED from 08/01/2022 in [facility] from 07/06/2022 in [facility] No Risk No Risk No Risk       Last PHQ 2/9 Scores:    08/09/2022    2:00 PM 07/06/2022    2:35 PM 07/06/2022    2:45 AM  Depression screen PHQ 2/9  Decreased Interest 2 1 1   Down, Depressed, Hopeless 1 0 1  PHQ - 2 Score 3 1 2   Altered sleeping 2 0 1  Tired, decreased energy 1 0   Change in appetite 1 2 2   Feeling bad or failure about yourself  2 3 2   Trouble concentrating 2 1 1   Moving slowly or fidgety/restless 0 3 1  Suicidal thoughts 1 0 1  PHQ-9 Score 12 10 10   Difficult doing work/chores Extremely dIfficult Very difficult Somewhat difficult    Scribe for Treatment Team: Kerri Perches, LCSW 08/11/2022 11:06 AM

## 2022-08-11 NOTE — ED Notes (Signed)
Pt at phone making calls to resources provided to him by social work.

## 2022-08-12 DIAGNOSIS — F1120 Opioid dependence, uncomplicated: Secondary | ICD-10-CM | POA: Diagnosis not present

## 2022-08-12 DIAGNOSIS — F1920 Other psychoactive substance dependence, uncomplicated: Secondary | ICD-10-CM | POA: Diagnosis not present

## 2022-08-12 NOTE — ED Provider Notes (Signed)
Behavioral Health Progress Note  Date and Time: 08/12/2022 6:26 PM Name: Brian [name] MRN:  [id number]  Subjective:   Brian [name] is a 36 yr old male who presented to [facility] on 10/14 and was accepted to [facility] for Residential Treatment for substance abuse.  PPHx is significant for Bipolar Disorder, GAD, Substance Induced Mood Disorder, and Polysubstance Abuse (EtOH, Opioids, Cocaine, Amphetamines, THC).  He has been to Residential Treatment prior and has a history of Withdrawal Seizures several years ago.  The patient reports that he is currently homeless but has employment as an Special educational needs teacher.  He initially stated that he has [employer], but it turns out that his health plan is a much more limited variety.  The patient reports that he wants residential rehab.  Met with the patient today along with LCSW.  The patient was rejected from [facility].  The patient is unable to attend [facility] given that he has some insurance.  The patient is encouraged to call friends of Brian [name] to see if he can come there after discharge if residential rehab does not work out.  The patient denies experiencing any medical problems.  He states that he is sleeping and eating appropriately.  He denies suicidal and homicidal thoughts.     Diagnosis:  Final diagnoses:  Polysubstance dependence including opioid type drug, episodic abuse ([place])  Housing instability after recent homelessness    Total Time spent with patient: 20 minutes  Past Psychiatric History: Bipolar Disorder, GAD, Substance Induced Mood Disorder, and Polysubstance Abuse (EtOH, Opioids, Cocaine, Amphetamines, THC).  He has been to Residential Treatment prior and has a history of Withdrawal Seizures. Past Medical History:  Past Medical History:  Diagnosis Date   Alcohol abuse    Anxiety    Bipolar 2 disorder ([place])    Cocaine abuse ([place])    Depression    Kidney stones    Methamphetamine abuse, episodic (H[facility])     Myocardial infarction ([place])    Narcotic abuse ([place])    Peptic ulcer    Polysubstance abuse ([place])    Renal disorder    kidney stones    Past Surgical History:  Procedure Laterality Date   ESOPHAGOGASTRODUODENOSCOPY N/A 05/29/2014   Procedure: ESOPHAGOGASTRODUODENOSCOPY (EGD) ;  Surgeon: Beryle Beams, MD;  Location: [facility] ENDOSCOPY;  Service: Endoscopy;  Laterality: N/A;  check with Dr. Collene Mares about sedation type/timinng - I recommend MAC   HAND SURGERY  2006   LEFT HEART CATH AND CORONARY ANGIOGRAPHY N/A 06/21/2020   Procedure: LEFT HEART CATH AND CORONARY ANGIOGRAPHY;  Surgeon: Nelva Bush, MD;  Location: [facility];  Service: Cardiovascular;  Laterality: N/A;   Family History:  Family History  Problem Relation Age of Onset   Heart disease Father    Family Psychiatric  History: Reports None Social History:  Social History   Substance and Sexual Activity  Alcohol Use Not Currently     Social History   Substance and Sexual Activity  Drug Use Yes   Types: IV, Methamphetamines    Social History   Socioeconomic History   Marital status: Single    Spouse name: Not on file   Number of children: Not on file   Years of education: Not on file   Highest education level: Not on file  Occupational History   Not on file  Tobacco Use   Smoking status: Every Day    Packs/day: 0.50    Types: Cigarettes   Smokeless tobacco: Never  Vaping Use   Vaping Use: Never used  Substance and Sexual Activity   Alcohol use: Not Currently   Drug use: Yes    Types: IV, Methamphetamines   Sexual activity: Not Currently  Other Topics Concern   Not on file  Social History Narrative   ** Merged History Encounter **       Lives with a friend   Lost house and custody of son 9broke up with fiancee)   Nature conservation officer   Social Determinants of Health   Financial Resource Strain: Not on file  Food Insecurity: Not on file  Transportation Needs: Not on file  Physical Activity: Not  on file  Stress: Not on file  Social Connections: Not on file   SDOH:  [place]: High Risk (08/09/2020)  Alcohol Screen: Low Risk  (12/21/2020)  Depression (PHQ2-9): High Risk (08/09/2022)  Tobacco Use: High Risk (08/01/2022)   Additional Social History:    Pain Medications: See MAR Prescriptions: See MAR Over the Counter: See MAR History of alcohol / drug use?: Yes Longest period of sobriety (when/how long): Pt reports, three years. Name of Substance 1: Methamphetamines.  Pt thinks it could have been cut with Fentanyl. 1 - Age of First Use: UTA 1 - Amount (size/oz): Pt reports, using about $20 worth this morning. 1 - Frequency: Per pt, almost everyday. 1 - Duration: Ongoing. 1 - Last Use / Amount: 08/08/2022. 1 - Method of Aquiring: Purchase. 1- Route of Use: UTA Name of Substance 2: Crack Cocaine. Pt thinks it could have been cut with Fentanyl. 2 - Age of First Use: UTA 2 - Amount (size/oz): Pt reports, using a gram or two a few days ago. 2 - Frequency: Ongoing. 2 - Duration: Ongoing. 2 - Last Use / Amount: A few days ago. 2 - Method of Aquiring: Purchase. 2 - Route of Substance Use: UTA                Sleep: Good  Appetite:  Good  Current Medications:  Current Facility-Administered Medications  Medication Dose Route Frequency Provider Last Rate Last Admin   acetaminophen (TYLENOL) tablet 650 mg  650 mg Oral Q6H PRN Onuoha, Chinwendu V, NP   650 mg at 08/09/22 0255   alum & mag hydroxide-simeth (MAALOX/MYLANTA) 200-200-20 MG/5ML suspension 30 mL  30 mL Oral Q4H PRN Onuoha, Chinwendu V, NP       atorvastatin (LIPITOR) tablet 80 mg  80 mg Oral Daily Corky Sox, MD   80 mg at 08/12/22 1014   buPROPion (WELLBUTRIN XL) 24 hr tablet 150 mg  150 mg Oral Daily Derrill Center, NP   150 mg at 08/12/22 1014   dicyclomine (BENTYL) tablet 20 mg  20 mg Oral Q6H PRN Onuoha, Chinwendu V, NP       gabapentin (NEURONTIN) capsule 300 mg  300 mg Oral TID  Corky Sox, MD   300 mg at 08/12/22 1516   hydrOXYzine (ATARAX) tablet 25 mg  25 mg Oral Q6H PRN Onuoha, Chinwendu V, NP   25 mg at 08/12/22 1059   loperamide (IMODIUM) capsule 2-4 mg  2-4 mg Oral PRN Onuoha, Chinwendu V, NP       LORazepam (ATIVAN) tablet 1 mg  1 mg Oral Q6H PRN [name], MD       Or   LORazepam (ATIVAN) injection 1 mg  1 mg Intramuscular Q6H PRN [name], MD       magnesium hydroxide (MILK OF  MAGNESIA) suspension 30 mL  30 mL Oral Daily PRN Onuoha, Chinwendu V, NP       methocarbamol (ROBAXIN) tablet 500 mg  500 mg Oral Q8H PRN Onuoha, Chinwendu V, NP   500 mg at 08/12/22 1059   neomycin-bacitracin-polymyxin (NEOSPORIN) ointment   Topical PRN Nwoko, Uchenna E, PA       ondansetron (ZOFRAN-ODT) disintegrating tablet 4 mg  4 mg Oral Q6H PRN Onuoha, Chinwendu V, NP       QUEtiapine (SEROQUEL) tablet 200 mg  200 mg Oral QHS Corky Sox, MD   200 mg at 08/11/22 2148   thiamine (VITAMIN B1) tablet 100 mg  100 mg Oral Daily [name], MD   100 mg at 08/12/22 1014   Current Outpatient Medications  Medication Sig Dispense Refill   buPROPion (WELLBUTRIN XL) 300 MG 24 hr tablet Take 1 tablet (300 mg total) by mouth daily for 7 days. 7 tablet 0   gabapentin (NEURONTIN) 300 MG capsule Take 300 mg by mouth 3 (three) times daily.     hydrOXYzine (ATARAX) 25 MG tablet Take 1 tablet (25 mg total) by mouth 3 (three) times daily as needed for anxiety. 90 tablet 1   LIPITOR 80 MG tablet Take 1 tablet (80 mg total) by mouth at bedtime. 30 tablet 1   QUEtiapine (SEROQUEL) 200 MG tablet Take 200 mg by mouth at bedtime.      Labs  Lab Results:  Admission on 08/09/2022  Component Date Value Ref Range Status   SARS Coronavirus 2 by RT PCR 08/09/2022 NEGATIVE  NEGATIVE Final   Comment: (NOTE) SARS-CoV-2 target nucleic acids are NOT DETECTED.  The SARS-CoV-2 RNA is generally detectable in upper respiratory specimens during the acute phase of  infection. The lowest concentration of SARS-CoV-2 viral copies this assay can detect is 138 copies/mL. A negative result does not preclude SARS-Cov-2 infection and should not be used as the sole basis for treatment or other patient management decisions. A negative result may occur with  improper specimen collection/handling, submission of specimen other than nasopharyngeal swab, presence of viral mutation(s) within the areas targeted by this assay, and inadequate number of viral copies(<138 copies/mL). A negative result must be combined with clinical observations, patient history, and epidemiological information. The expected result is Negative.  Fact Sheet for Patients:  EntrepreneurPulse.com.au  Fact Sheet for Healthcare Providers:  IncredibleEmployment.be  This test is no                          t yet approved or cleared by the [place] FDA and  has been authorized for detection and/or diagnosis of SARS-CoV-2 by FDA under an Emergency Use Authorization (EUA). This EUA will remain  in effect (meaning this test can be used) for the duration of the COVID-19 declaration under Section 564(b)(1) of the Act, 21 U.S.C.section 360bbb-3(b)(1), unless the authorization is terminated  or revoked sooner.       Influenza A by PCR 08/09/2022 NEGATIVE  NEGATIVE Final   Influenza B by PCR 08/09/2022 NEGATIVE  NEGATIVE Final   Comment: (NOTE) The Xpert Xpress SARS-CoV-2/FLU/RSV plus assay is intended as an aid in the diagnosis of influenza from Nasopharyngeal swab specimens and should not be used as a sole basis for treatment. Nasal washings and aspirates are unacceptable for Xpert Xpress SARS-CoV-2/FLU/RSV testing.  Fact Sheet for Patients: EntrepreneurPulse.com.au  Fact Sheet for Healthcare Providers: IncredibleEmployment.be  This test is not yet approved or cleared by  the [employer] and has been  authorized for detection and/or diagnosis of SARS-CoV-2 by FDA under an Emergency Use Authorization (EUA). This EUA will remain in effect (meaning this test can be used) for the duration of the COVID-19 declaration under Section 564(b)(1) of the Act, 21 U.S.C. section 360bbb-3(b)(1), unless the authorization is terminated or revoked.  Performed at [facility], [place] [address], [place], [place] [address]    WBC 08/09/2022 13.1 (H)  4.0 - 10.5 K/uL Final   RBC 08/09/2022 4.69  4.22 - 5.81 MIL/uL Final   Hemoglobin 08/09/2022 14.1  13.0 - 17.0 g/dL Final   HCT 08/09/2022 43.1  39.0 - 52.0 % Final   MCV 08/09/2022 91.9  80.0 - 100.0 fL Final   MCH 08/09/2022 30.1  26.0 - 34.0 pg Final   MCHC 08/09/2022 32.7  30.0 - 36.0 g/dL Final   RDW 08/09/2022 15.6 (H)  11.5 - 15.5 % Final   Platelets 08/09/2022 299  150 - 400 K/uL Final   nRBC 08/09/2022 0.0  0.0 - 0.2 % Final   Neutrophils Relative % 08/09/2022 79  % Final   Neutro Abs 08/09/2022 10.3 (H)  1.7 - 7.7 K/uL Final   Lymphocytes Relative 08/09/2022 12  % Final   Lymphs Abs 08/09/2022 1.6  0.7 - 4.0 K/uL Final   Monocytes Relative 08/09/2022 7  % Final   Monocytes Absolute 08/09/2022 1.0  0.1 - 1.0 K/uL Final   Eosinophils Relative 08/09/2022 1  % Final   Eosinophils Absolute 08/09/2022 0.1  0.0 - 0.5 K/uL Final   Basophils Relative 08/09/2022 0  % Final   Basophils Absolute 08/09/2022 0.0  0.0 - 0.1 K/uL Final   Immature Granulocytes 08/09/2022 1  % Final   Abs Immature Granulocytes 08/09/2022 0.07  0.00 - 0.07 K/uL Final   Performed at [facility], [place] [address], [place], [place] [address]   Sodium 08/09/2022 136  135 - 145 mmol/L Final   Potassium 08/09/2022 4.1  3.5 - 5.1 mmol/L Final   Chloride 08/09/2022 102  98 - 111 mmol/L Final   CO2 08/09/2022 26  22 - 32 mmol/L Final   Glucose, Bld 08/09/2022 101 (H)  70 - 99 mg/dL Final   Glucose reference range applies only to samples taken after fasting for at least 8  hours.   BUN 08/09/2022 11  6 - 20 mg/dL Final   Creatinine, Ser 08/09/2022 1.26 (H)  0.61 - 1.24 mg/dL Final   Calcium 08/09/2022 9.5  8.9 - 10.3 mg/dL Final   Total Protein 08/09/2022 7.5  6.5 - 8.1 g/dL Final   Albumin 08/09/2022 4.0  3.5 - 5.0 g/dL Final   AST 08/09/2022 23  15 - 41 U/L Final   ALT 08/09/2022 23  0 - 44 U/L Final   Alkaline Phosphatase 08/09/2022 65  38 - 126 U/L Final   Total Bilirubin 08/09/2022 0.4  0.3 - 1.2 mg/dL Final   GFR, Estimated 08/09/2022 >60  >60 mL/min Final   Comment: (NOTE) Calculated using the CKD-EPI Creatinine Equation (2021)    Anion gap 08/09/2022 8  5 - 15 Final   Performed at [facility] [address] [place], [place] [address]   Hgb A1c MFr Bld 08/09/2022 5.7 (H)  4.8 - 5.6 % Final   Comment: (NOTE) Pre diabetes:          5.7%-6.4%  Diabetes:              >  6.4%  Glycemic control for   <7.0% adults with diabetes    Mean Plasma Glucose 08/09/2022 116.89  mg/dL Final   Performed at [facility] [address], [place], [place] [address]   Alcohol, Ethyl (B) 08/09/2022 <10  <10 mg/dL Final   Comment: (NOTE) Lowest detectable limit for serum alcohol is 10 mg/dL.  For medical purposes only. Performed at [facility], [place] [address], [place], [place] [address]    POC Amphetamine UR 08/09/2022 None Detected  NONE DETECTED (Cut Off Level 1000 ng/mL) Preliminary   POC Secobarbital (BAR) 08/09/2022 None Detected  NONE DETECTED (Cut Off Level 300 ng/mL) Preliminary   POC Buprenorphine (BUP) 08/09/2022 None Detected  NONE DETECTED (Cut Off Level 10 ng/mL) Preliminary   POC Oxazepam (BZO) 08/09/2022 None Detected  NONE DETECTED (Cut Off Level 300 ng/mL) Preliminary   POC Cocaine UR 08/09/2022 None Detected  NONE DETECTED (Cut Off Level 300 ng/mL) Preliminary   POC Methamphetamine UR 08/09/2022 Positive (A)  NONE DETECTED (Cut Off Level 1000 ng/mL) Preliminary   POC Morphine 08/09/2022 None Detected  NONE DETECTED (Cut Off  Level 300 ng/mL) Preliminary   POC Methadone UR 08/09/2022 None Detected  NONE DETECTED (Cut Off Level 300 ng/mL) Preliminary   POC Oxycodone UR 08/09/2022 None Detected  NONE DETECTED (Cut Off Level 100 ng/mL) Preliminary   POC Marijuana UR 08/09/2022 Positive (A)  NONE DETECTED (Cut Off Level 50 ng/mL) Preliminary   SARSCOV2ONAVIRUS 2 AG 08/09/2022 NEGATIVE  NEGATIVE Final   Comment: (NOTE) SARS-CoV-2 antigen NOT DETECTED.   Negative results are presumptive.  Negative results do not preclude SARS-CoV-2 infection and should not be used as the sole basis for treatment or other patient management decisions, including infection  control decisions, particularly in the presence of clinical signs and  symptoms consistent with COVID-19, or in those who have been in contact with the virus.  Negative results must be combined with clinical observations, patient history, and epidemiological information. The expected result is Negative.  Fact Sheet for Patients: HandmadeRecipes.com.cy  Fact Sheet for Healthcare Providers: FuneralLife.at  This test is not yet approved or cleared by the [place] FDA and  has been authorized for detection and/or diagnosis of SARS-CoV-2 by FDA under an Emergency Use Authorization (EUA).  This EUA will remain in effect (meaning this test can be used) for the duration of  the COV                          ID-19 declaration under Section 564(b)(1) of the Act, 21 U.S.C. section 360bbb-3(b)(1), unless the authorization is terminated or revoked sooner.     Cholesterol 08/09/2022 139  0 - 200 mg/dL Final   Triglycerides 08/09/2022 33  <150 mg/dL Final   HDL 08/09/2022 61  >40 mg/dL Final   Total CHOL/HDL Ratio 08/09/2022 2.3  RATIO Final   VLDL 08/09/2022 7  0 - 40 mg/dL Final   LDL Cholesterol 08/09/2022 71  0 - 99 mg/dL Final   Comment:        Total Cholesterol/HDL:CHD Risk Coronary Heart Disease Risk Table                      Men   Women  1/2 Average Risk   3.4   3.3  Average Risk       5.0   4.4  2 X Average Risk   9.6   7.1  3  X Average Risk  23.4   11.0        Use the calculated Patient Ratio above and the CHD Risk Table to determine the patient's CHD Risk.        ATP III CLASSIFICATION (LDL):  <100     mg/dL   Optimal  100-129  mg/dL   Near or Above                    Optimal  130-159  mg/dL   Borderline  160-189  mg/dL   High  >190     mg/dL   Very High Performed at [facility] [address] [place], [place] [address]    TSH 08/09/2022 2.293  0.350 - 4.500 uIU/mL Final   Comment: Performed by a 3rd Generation assay with a functional sensitivity of <=0.01 uIU/mL. Performed at [facility], [place] [address] [place], [place] [address]   Admission on 08/01/2022, Discharged on 08/03/2022  Component Date Value Ref Range Status   Sodium 08/01/2022 142  135 - 145 mmol/L Final   Potassium 08/01/2022 3.8  3.5 - 5.1 mmol/L Final   Chloride 08/01/2022 111  98 - 111 mmol/L Final   CO2 08/01/2022 22  22 - 32 mmol/L Final   Glucose, Bld 08/01/2022 94  70 - 99 mg/dL Final   Glucose reference range applies only to samples taken after fasting for at least 8 hours.   BUN 08/01/2022 12  6 - 20 mg/dL Final   Creatinine, Ser 08/01/2022 1.50 (H)  0.61 - 1.24 mg/dL Final   Calcium 08/01/2022 8.9  8.9 - 10.3 mg/dL Final   Total Protein 08/01/2022 6.9  6.5 - 8.1 g/dL Final   Albumin 08/01/2022 3.8  3.5 - 5.0 g/dL Final   AST 08/01/2022 38  15 - 41 U/L Final   ALT 08/01/2022 29  0 - 44 U/L Final   Alkaline Phosphatase 08/01/2022 64  38 - 126 U/L Final   Total Bilirubin 08/01/2022 0.7  0.3 - 1.2 mg/dL Final   GFR, Estimated 08/01/2022 >60  >60 mL/min Final   Comment: (NOTE) Calculated using the CKD-EPI Creatinine Equation (2021)    Anion gap 08/01/2022 9  5 - 15 Final   Performed at [facility] [address] [place], [place] [address]   Alcohol, Ethyl (B) 08/01/2022 <10  <10  mg/dL Final   Comment: (NOTE) Lowest detectable limit for serum alcohol is 10 mg/dL.  For medical purposes only. Performed at [facility], [place] [address], [place], [place] [address]    Opiates 08/01/2022 NONE DETECTED  NONE DETECTED Final   Cocaine 08/01/2022 POSITIVE (A)  NONE DETECTED Final   Benzodiazepines 08/01/2022 NONE DETECTED  NONE DETECTED Final   Amphetamines 08/01/2022 POSITIVE (A)  NONE DETECTED Final   Tetrahydrocannabinol 08/01/2022 POSITIVE (A)  NONE DETECTED Final   Barbiturates 08/01/2022 NONE DETECTED  NONE DETECTED Final   Comment: (NOTE) DRUG SCREEN FOR MEDICAL PURPOSES ONLY.  IF CONFIRMATION IS NEEDED FOR ANY PURPOSE, NOTIFY LAB WITHIN 5 DAYS.  LOWEST DETECTABLE LIMITS FOR URINE DRUG SCREEN Drug Class                     Cutoff (ng/mL) Amphetamine and metabolites    1000 Barbiturate and metabolites    200 Benzodiazepine                 378 Tricyclics and metabolites     300  Opiates and metabolites        300 Cocaine and metabolites        300 THC                            50 Performed at [facility], [place] [address] [place], [place] [address]    WBC 08/01/2022 11.3 (H)  4.0 - 10.5 K/uL Final   RBC 08/01/2022 4.16 (L)  4.22 - 5.81 MIL/uL Final   Hemoglobin 08/01/2022 12.5 (L)  13.0 - 17.0 g/dL Final   HCT 08/01/2022 38.4 (L)  39.0 - 52.0 % Final   MCV 08/01/2022 92.3  80.0 - 100.0 fL Final   MCH 08/01/2022 30.0  26.0 - 34.0 pg Final   MCHC 08/01/2022 32.6  30.0 - 36.0 g/dL Final   RDW 08/01/2022 15.2  11.5 - 15.5 % Final   Platelets 08/01/2022 247  150 - 400 K/uL Final   nRBC 08/01/2022 0.0  0.0 - 0.2 % Final   Neutrophils Relative % 08/01/2022 78  % Final   Neutro Abs 08/01/2022 8.7 (H)  1.7 - 7.7 K/uL Final   Lymphocytes Relative 08/01/2022 10  % Final   Lymphs Abs 08/01/2022 1.2  0.7 - 4.0 K/uL Final   Monocytes Relative 08/01/2022 10  % Final   Monocytes Absolute 08/01/2022 1.1 (H)  0.1 - 1.0 K/uL Final   Eosinophils Relative  08/01/2022 2  % Final   Eosinophils Absolute 08/01/2022 0.2  0.0 - 0.5 K/uL Final   Basophils Relative 08/01/2022 0  % Final   Basophils Absolute 08/01/2022 0.1  0.0 - 0.1 K/uL Final   Immature Granulocytes 08/01/2022 0  % Final   Abs Immature Granulocytes 08/01/2022 0.03  0.00 - 0.07 K/uL Final   Performed at [facility], [place] [address] [place], [place] [address]  Admission on 07/06/2022, Discharged on 07/07/2022  Component Date Value Ref Range Status   SARS Coronavirus 2 by RT PCR 07/06/2022 NEGATIVE  NEGATIVE Final   Comment: (NOTE) SARS-CoV-2 target nucleic acids are NOT DETECTED.  The SARS-CoV-2 RNA is generally detectable in upper respiratory specimens during the acute phase of infection. The lowest concentration of SARS-CoV-2 viral copies this assay can detect is 138 copies/mL. A negative result does not preclude SARS-Cov-2 infection and should not be used as the sole basis for treatment or other patient management decisions. A negative result may occur with  improper specimen collection/handling, submission of specimen other than nasopharyngeal swab, presence of viral mutation(s) within the areas targeted by this assay, and inadequate number of viral copies(<138 copies/mL). A negative result must be combined with clinical observations, patient history, and epidemiological information. The expected result is Negative.  Fact Sheet for Patients:  EntrepreneurPulse.com.au  Fact Sheet for Healthcare Providers:  IncredibleEmployment.be  This test is no                          t yet approved or cleared by the [place] FDA and  has been authorized for detection and/or diagnosis of SARS-CoV-2 by FDA under an Emergency Use Authorization (EUA). This EUA will remain  in effect (meaning this test can be used) for the duration of the COVID-19 declaration under Section 564(b)(1) of the Act, 21 U.S.C.section 360bbb-3(b)(1), unless the  authorization is terminated  or revoked sooner.       Influenza A by PCR 07/06/2022 NEGATIVE  NEGATIVE Final  Influenza B by PCR 07/06/2022 NEGATIVE  NEGATIVE Final   Comment: (NOTE) The Xpert Xpress SARS-CoV-2/FLU/RSV plus assay is intended as an aid in the diagnosis of influenza from Nasopharyngeal swab specimens and should not be used as a sole basis for treatment. Nasal washings and aspirates are unacceptable for Xpert Xpress SARS-CoV-2/FLU/RSV testing.  Fact Sheet for Patients: EntrepreneurPulse.com.au  Fact Sheet for Healthcare Providers: IncredibleEmployment.be  This test is not yet approved or cleared by the [place] FDA and has been authorized for detection and/or diagnosis of SARS-CoV-2 by FDA under an Emergency Use Authorization (EUA). This EUA will remain in effect (meaning this test can be used) for the duration of the COVID-19 declaration under Section 564(b)(1) of the Act, 21 U.S.C. section 360bbb-3(b)(1), unless the authorization is terminated or revoked.  Performed at [facility], [place] [address], [place], [place] [address]    WBC 07/06/2022 10.2  4.0 - 10.5 K/uL Final   RBC 07/06/2022 4.50  4.22 - 5.81 MIL/uL Final   Hemoglobin 07/06/2022 13.4  13.0 - 17.0 g/dL Final   HCT 07/06/2022 39.3  39.0 - 52.0 % Final   MCV 07/06/2022 87.3  80.0 - 100.0 fL Final   MCH 07/06/2022 29.8  26.0 - 34.0 pg Final   MCHC 07/06/2022 34.1  30.0 - 36.0 g/dL Final   RDW 07/06/2022 13.2  11.5 - 15.5 % Final   Platelets 07/06/2022 273  150 - 400 K/uL Final   nRBC 07/06/2022 0.0  0.0 - 0.2 % Final   Neutrophils Relative % 07/06/2022 68  % Final   Neutro Abs 07/06/2022 6.9  1.7 - 7.7 K/uL Final   Lymphocytes Relative 07/06/2022 20  % Final   Lymphs Abs 07/06/2022 2.0  0.7 - 4.0 K/uL Final   Monocytes Relative 07/06/2022 10  % Final   Monocytes Absolute 07/06/2022 1.0  0.1 - 1.0 K/uL Final   Eosinophils Relative 07/06/2022 1  %  Final   Eosinophils Absolute 07/06/2022 0.1  0.0 - 0.5 K/uL Final   Basophils Relative 07/06/2022 1  % Final   Basophils Absolute 07/06/2022 0.1  0.0 - 0.1 K/uL Final   Immature Granulocytes 07/06/2022 0  % Final   Abs Immature Granulocytes 07/06/2022 0.03  0.00 - 0.07 K/uL Final   Performed at [facility], [place] [address] [place], [place] [address]   Sodium 07/06/2022 137  135 - 145 mmol/L Final   Potassium 07/06/2022 4.0  3.5 - 5.1 mmol/L Final   Chloride 07/06/2022 102  98 - 111 mmol/L Final   CO2 07/06/2022 25  22 - 32 mmol/L Final   Glucose, Bld 07/06/2022 83  70 - 99 mg/dL Final   Glucose reference range applies only to samples taken after fasting for at least 8 hours.   BUN 07/06/2022 16  6 - 20 mg/dL Final   Creatinine, Ser 07/06/2022 1.18  0.61 - 1.24 mg/dL Final   Calcium 07/06/2022 9.6  8.9 - 10.3 mg/dL Final   Total Protein 07/06/2022 7.2  6.5 - 8.1 g/dL Final   Albumin 07/06/2022 3.9  3.5 - 5.0 g/dL Final   AST 07/06/2022 72 (H)  15 - 41 U/L Final   ALT 07/06/2022 60 (H)  0 - 44 U/L Final   Alkaline Phosphatase 07/06/2022 82  38 - 126 U/L Final   Total Bilirubin 07/06/2022 0.6  0.3 - 1.2 mg/dL Final   GFR, Estimated 07/06/2022 >60  >60 mL/min Final   Comment: (NOTE) Calculated using the  CKD-EPI Creatinine Equation (2021)    Anion gap 07/06/2022 10  5 - 15 Final   Performed at [facility], [place] [address] [place], [place] [address]   Alcohol, Ethyl (B) 07/06/2022 <10  <10 mg/dL Final   Comment: (NOTE) Lowest detectable limit for serum alcohol is 10 mg/dL.  For medical purposes only. Performed at [facility], [place] [address] [place], [place] [address]    POC Amphetamine UR 07/06/2022 Positive (PE)  NONE DETECTED (Cut Off Level 1000 ng/mL) Final   POC Secobarbital (BAR) 07/06/2022 None Detected  NONE DETECTED (Cut Off Level 300 ng/mL) Final   POC Buprenorphine (BUP) 07/06/2022 Positive (PE)  NONE DETECTED (Cut Off Level 10 ng/mL) Final   POC  Oxazepam (BZO) 07/06/2022 None Detected  NONE DETECTED (Cut Off Level 300 ng/mL) Final   POC Cocaine UR 07/06/2022 None Detected  NONE DETECTED (Cut Off Level 300 ng/mL) Final   POC Methamphetamine UR 07/06/2022 Positive (PE)  NONE DETECTED (Cut Off Level 1000 ng/mL) Final   POC Morphine 07/06/2022 None Detected  NONE DETECTED (Cut Off Level 300 ng/mL) Final   POC Methadone UR 07/06/2022 None Detected  NONE DETECTED (Cut Off Level 300 ng/mL) Final   POC Oxycodone UR 07/06/2022 None Detected  NONE DETECTED (Cut Off Level 100 ng/mL) Final   POC Marijuana UR 07/06/2022 None Detected  NONE DETECTED (Cut Off Level 50 ng/mL) Final   SARSCOV2ONAVIRUS 2 AG 07/06/2022 NEGATIVE  NEGATIVE Final   Comment: (NOTE) SARS-CoV-2 antigen NOT DETECTED.   Negative results are presumptive.  Negative results do not preclude SARS-CoV-2 infection and should not be used as the sole basis for treatment or other patient management decisions, including infection  control decisions, particularly in the presence of clinical signs and  symptoms consistent with COVID-19, or in those who have been in contact with the virus.  Negative results must be combined with clinical observations, patient history, and epidemiological information. The expected result is Negative.  Fact Sheet for Patients: HandmadeRecipes.com.cy  Fact Sheet for Healthcare Providers: FuneralLife.at  This test is not yet approved or cleared by the [place] FDA and  has been authorized for detection and/or diagnosis of SARS-CoV-2 by FDA under an Emergency Use Authorization (EUA).  This EUA will remain in effect (meaning this test can be used) for the duration of  the COV                          ID-19 declaration under Section 564(b)(1) of the Act, 21 U.S.C. section 360bbb-3(b)(1), unless the authorization is terminated or revoked sooner.     Cholesterol 07/06/2022 137  0 - 200 mg/dL Final    Triglycerides 07/06/2022 56  <150 mg/dL Final   HDL 07/06/2022 45  >40 mg/dL Final   Total CHOL/HDL Ratio 07/06/2022 3.0  RATIO Final   VLDL 07/06/2022 11  0 - 40 mg/dL Final   LDL Cholesterol 07/06/2022 81  0 - 99 mg/dL Final   Comment:        Total Cholesterol/HDL:CHD Risk Coronary Heart Disease Risk Table                     Men   Women  1/2 Average Risk   3.4   3.3  Average Risk       5.0   4.4  2 X Average Risk   9.6   7.1  3 X Average Risk  23.4  11.0        Use the calculated Patient Ratio above and the CHD Risk Table to determine the patient's CHD Risk.        ATP III CLASSIFICATION (LDL):  <100     mg/dL   Optimal  100-129  mg/dL   Near or Above                    Optimal  130-159  mg/dL   Borderline  160-189  mg/dL   High  >190     mg/dL   Very High Performed at [place] [facility] [address], [place], [place] [address]    TSH 07/06/2022 1.168  0.350 - 4.500 uIU/mL Final   Comment: Performed by a 3rd Generation assay with a functional sensitivity of <=0.01 uIU/mL. Performed at [facility], [place] [address] [place], [place] [address]   Admission on 07/03/2022, Discharged on 07/03/2022  Component Date Value Ref Range Status   SARS Coronavirus 2 by RT PCR 07/03/2022 NEGATIVE  NEGATIVE Final   Comment: (NOTE) SARS-CoV-2 target nucleic acids are NOT DETECTED.  The SARS-CoV-2 RNA is generally detectable in upper respiratory specimens during the acute phase of infection. The lowest concentration of SARS-CoV-2 viral copies this assay can detect is 138 copies/mL. A negative result does not preclude SARS-Cov-2 infection and should not be used as the sole basis for treatment or other patient management decisions. A negative result may occur with  improper specimen collection/handling, submission of specimen other than nasopharyngeal swab, presence of viral mutation(s) within the areas targeted by this assay, and inadequate number of viral copies(<138  copies/mL). A negative result must be combined with clinical observations, patient history, and epidemiological information. The expected result is Negative.  Fact Sheet for Patients:  EntrepreneurPulse.com.au  Fact Sheet for Healthcare Providers:  IncredibleEmployment.be  This test is no                          t yet approved or cleared by the [place] FDA and  has been authorized for detection and/or diagnosis of SARS-CoV-2 by FDA under an Emergency Use Authorization (EUA). This EUA will remain  in effect (meaning this test can be used) for the duration of the COVID-19 declaration under Section 564(b)(1) of the Act, 21 U.S.C.section 360bbb-3(b)(1), unless the authorization is terminated  or revoked sooner.       Influenza A by PCR 07/03/2022 NEGATIVE  NEGATIVE Final   Influenza B by PCR 07/03/2022 NEGATIVE  NEGATIVE Final   Comment: (NOTE) The Xpert Xpress SARS-CoV-2/FLU/RSV plus assay is intended as an aid in the diagnosis of influenza from Nasopharyngeal swab specimens and should not be used as a sole basis for treatment. Nasal washings and aspirates are unacceptable for Xpert Xpress SARS-CoV-2/FLU/RSV testing.  Fact Sheet for Patients: EntrepreneurPulse.com.au  Fact Sheet for Healthcare Providers: IncredibleEmployment.be  This test is not yet approved or cleared by the [place] FDA and has been authorized for detection and/or diagnosis of SARS-CoV-2 by FDA under an Emergency Use Authorization (EUA). This EUA will remain in effect (meaning this test can be used) for the duration of the COVID-19 declaration under Section 564(b)(1) of the Act, 21 U.S.C. section 360bbb-3(b)(1), unless the authorization is terminated or revoked.  Performed at [facility], [place] [address], [place],  [address]    Alcohol, Ethyl (B) 07/03/2022 <10  <10 mg/dL Final   Comment:  (NOTE) Lowest  detectable limit for serum alcohol is 10 mg/dL.  For medical purposes only. Performed at [facility], [place] [address], [place], [place] [address]    Opiates 07/03/2022 NONE DETECTED  NONE DETECTED Final   Cocaine 07/03/2022 POSITIVE (A)  NONE DETECTED Final   Benzodiazepines 07/03/2022 NONE DETECTED  NONE DETECTED Final   Amphetamines 07/03/2022 POSITIVE (A)  NONE DETECTED Final   Tetrahydrocannabinol 07/03/2022 NONE DETECTED  NONE DETECTED Final   Barbiturates 07/03/2022 NONE DETECTED  NONE DETECTED Final   Comment: (NOTE) DRUG SCREEN FOR MEDICAL PURPOSES ONLY.  IF CONFIRMATION IS NEEDED FOR ANY PURPOSE, NOTIFY LAB WITHIN 5 DAYS.  LOWEST DETECTABLE LIMITS FOR URINE DRUG SCREEN Drug Class                     Cutoff (ng/mL) Amphetamine and metabolites    1000 Barbiturate and metabolites    200 Benzodiazepine                 924 Tricyclics and metabolites     300 Opiates and metabolites        300 Cocaine and metabolites        300 THC                            50 Performed at [facility], [place] [address] [place], [place] [address]   Admission on 04/11/2022, Discharged on 04/14/2022  Component Date Value Ref Range Status   SARS Coronavirus 2 by RT PCR 04/11/2022 NEGATIVE  NEGATIVE Final   Comment: (NOTE) SARS-CoV-2 target nucleic acids are NOT DETECTED.  The SARS-CoV-2 RNA is generally detectable in upper respiratory specimens during the acute phase of infection. The lowest concentration of SARS-CoV-2 viral copies this assay can detect is 138 copies/mL. A negative result does not preclude SARS-Cov-2 infection and should not be used as the sole basis for treatment or other patient management decisions. A negative result may occur with  improper specimen collection/handling, submission of specimen other than nasopharyngeal swab, presence of viral mutation(s) within the areas targeted by this assay, and inadequate number  of viral copies(<138 copies/mL). A negative result must be combined with clinical observations, patient history, and epidemiological information. The expected result is Negative.  Fact Sheet for Patients:  EntrepreneurPulse.com.au  Fact Sheet for Healthcare Providers:  IncredibleEmployment.be  This test is no                          t yet approved or cleared by the [place] FDA and  has been authorized for detection and/or diagnosis of SARS-CoV-2 by FDA under an Emergency Use Authorization (EUA). This EUA will remain  in effect (meaning this test can be used) for the duration of the COVID-19 declaration under Section 564(b)(1) of the Act, 21 U.S.C.section 360bbb-3(b)(1), unless the authorization is terminated  or revoked sooner.       Influenza A by PCR 04/11/2022 NEGATIVE  NEGATIVE Final   Influenza B by PCR 04/11/2022 NEGATIVE  NEGATIVE Final   Comment: (NOTE) The Xpert Xpress SARS-CoV-2/FLU/RSV plus assay is intended as an aid in the diagnosis of influenza from Nasopharyngeal swab specimens and should not be used as a sole basis for treatment. Nasal washings and aspirates are unacceptable for Xpert Xpress SARS-CoV-2/FLU/RSV testing.  Fact Sheet for Patients: EntrepreneurPulse.com.au  Fact Sheet for Healthcare Providers: IncredibleEmployment.be  This test is not yet approved or  cleared by the [place] and has been authorized for detection and/or diagnosis of SARS-CoV-2 by FDA under an Emergency Use Authorization (EUA). This EUA will remain in effect (meaning this test can be used) for the duration of the COVID-19 declaration under Section 564(b)(1) of the Act, 21 U.S.C. section 360bbb-3(b)(1), unless the authorization is terminated or revoked.  Performed at [facility], [place] [address] [place], [place] [address]    WBC 04/11/2022 13.9 (H)  4.0 - 10.5 K/uL Final   RBC  04/11/2022 4.37  4.22 - 5.81 MIL/uL Final   Hemoglobin 04/11/2022 13.0  13.0 - 17.0 g/dL Final   HCT 04/11/2022 40.0  39.0 - 52.0 % Final   MCV 04/11/2022 91.5  80.0 - 100.0 fL Final   MCH 04/11/2022 29.7  26.0 - 34.0 pg Final   MCHC 04/11/2022 32.5  30.0 - 36.0 g/dL Final   RDW 04/11/2022 15.1  11.5 - 15.5 % Final   Platelets 04/11/2022 322  150 - 400 K/uL Final   nRBC 04/11/2022 0.0  0.0 - 0.2 % Final   Neutrophils Relative % 04/11/2022 71  % Final   Neutro Abs 04/11/2022 9.9 (H)  1.7 - 7.7 K/uL Final   Lymphocytes Relative 04/11/2022 18  % Final   Lymphs Abs 04/11/2022 2.5  0.7 - 4.0 K/uL Final   Monocytes Relative 04/11/2022 8  % Final   Monocytes Absolute 04/11/2022 1.1 (H)  0.1 - 1.0 K/uL Final   Eosinophils Relative 04/11/2022 2  % Final   Eosinophils Absolute 04/11/2022 0.3  0.0 - 0.5 K/uL Final   Basophils Relative 04/11/2022 0  % Final   Basophils Absolute 04/11/2022 0.0  0.0 - 0.1 K/uL Final   Immature Granulocytes 04/11/2022 1  % Final   Abs Immature Granulocytes 04/11/2022 0.07  0.00 - 0.07 K/uL Final   Performed at [facility], [place] [address], [place], [place] [address]   Sodium 04/11/2022 138  135 - 145 mmol/L Final   Potassium 04/11/2022 4.3  3.5 - 5.1 mmol/L Final   Chloride 04/11/2022 104  98 - 111 mmol/L Final   CO2 04/11/2022 24  22 - 32 mmol/L Final   Glucose, Bld 04/11/2022 80  70 - 99 mg/dL Final   Glucose reference range applies only to samples taken after fasting for at least 8 hours.   BUN 04/11/2022 12  6 - 20 mg/dL Final   Creatinine, Ser 04/11/2022 1.17  0.61 - 1.24 mg/dL Final   Calcium 04/11/2022 9.2  8.9 - 10.3 mg/dL Final   Total Protein 04/11/2022 6.9  6.5 - 8.1 g/dL Final   Albumin 04/11/2022 3.9  3.5 - 5.0 g/dL Final   AST 04/11/2022 33  15 - 41 U/L Final   ALT 04/11/2022 47 (H)  0 - 44 U/L Final   Alkaline Phosphatase 04/11/2022 69  38 - 126 U/L Final   Total Bilirubin 04/11/2022 0.5  0.3 - 1.2 mg/dL Final   GFR, Estimated 04/11/2022  >60  >60 mL/min Final   Comment: (NOTE) Calculated using the CKD-EPI Creatinine Equation (2021)    Anion gap 04/11/2022 10  5 - 15 Final   Performed at [place] [address] [place], [place] [address]   POC Amphetamine UR 04/11/2022 None Detected  NONE DETECTED (Cut Off Level 1000 ng/mL) Final   POC Secobarbital (BAR) 04/11/2022 None Detected  NONE DETECTED (Cut Off Level 300 ng/mL) Final   POC Buprenorphine (BUP) 04/11/2022 None Detected  NONE DETECTED (Cut Off Level 10 ng/mL) Final   POC Oxazepam (BZO) 04/11/2022 None Detected  NONE DETECTED (Cut Off Level 300 ng/mL) Final   POC Cocaine UR 04/11/2022 None Detected  NONE DETECTED (Cut Off Level 300 ng/mL) Final   POC Methamphetamine UR 04/11/2022 None Detected  NONE DETECTED (Cut Off Level 1000 ng/mL) Final   POC Morphine 04/11/2022 None Detected  NONE DETECTED (Cut Off Level 300 ng/mL) Final   POC Methadone UR 04/11/2022 None Detected  NONE DETECTED (Cut Off Level 300 ng/mL) Final   POC Oxycodone UR 04/11/2022 None Detected  NONE DETECTED (Cut Off Level 100 ng/mL) Final   POC Marijuana UR 04/11/2022 None Detected  NONE DETECTED (Cut Off Level 50 ng/mL) Final  Admission on 03/30/2022, Discharged on 04/01/2022  Component Date Value Ref Range Status   SARS Coronavirus 2 by RT PCR 03/30/2022 NEGATIVE  NEGATIVE Final   Comment: (NOTE) SARS-CoV-2 target nucleic acids are NOT DETECTED.  The SARS-CoV-2 RNA is generally detectable in upper respiratory specimens during the acute phase of infection. The lowest concentration of SARS-CoV-2 viral copies this assay can detect is 138 copies/mL. A negative result does not preclude SARS-Cov-2 infection and should not be used as the sole basis for treatment or other patient management decisions. A negative result may occur with  improper specimen collection/handling, submission of specimen other than nasopharyngeal swab, presence of viral mutation(s) within the areas targeted by this  assay, and inadequate number of viral copies(<138 copies/mL). A negative result must be combined with clinical observations, patient history, and epidemiological information. The expected result is Negative.  Fact Sheet for Patients:  EntrepreneurPulse.com.au  Fact Sheet for Healthcare Providers:  IncredibleEmployment.be  This test is no                          t yet approved or cleared by the [place] FDA and  has been authorized for detection and/or diagnosis of SARS-CoV-2 by FDA under an Emergency Use Authorization (EUA). This EUA will remain  in effect (meaning this test can be used) for the duration of the COVID-19 declaration under Section 564(b)(1) of the Act, 21 U.S.C.section 360bbb-3(b)(1), unless the authorization is terminated  or revoked sooner.       Influenza A by PCR 03/30/2022 NEGATIVE  NEGATIVE Final   Influenza B by PCR 03/30/2022 NEGATIVE  NEGATIVE Final   Comment: (NOTE) The Xpert Xpress SARS-CoV-2/FLU/RSV plus assay is intended as an aid in the diagnosis of influenza from Nasopharyngeal swab specimens and should not be used as a sole basis for treatment. Nasal washings and aspirates are unacceptable for Xpert Xpress SARS-CoV-2/FLU/RSV testing.  Fact Sheet for Patients: EntrepreneurPulse.com.au  Fact Sheet for Healthcare Providers: IncredibleEmployment.be  This test is not yet approved or cleared by the [place] FDA and has been authorized for detection and/or diagnosis of SARS-CoV-2 by FDA under an Emergency Use Authorization (EUA). This EUA will remain in effect (meaning this test can be used) for the duration of the COVID-19 declaration under Section 564(b)(1) of the Act, 21 U.S.C. section 360bbb-3(b)(1), unless the authorization is terminated or revoked.  Performed at [facility], [place] [address], [place], [place] [address]    POC Amphetamine UR 03/30/2022  Positive (A)  NONE DETECTED (Cut Off Level 1000 ng/mL) Final   POC Secobarbital (BAR) 03/30/2022 None Detected  NONE DETECTED (Cut Off Level 300 ng/mL) Final   POC Buprenorphine (BUP) 03/30/2022 None Detected  NONE DETECTED (  Cut Off Level 10 ng/mL) Final   POC Oxazepam (BZO) 03/30/2022 None Detected  NONE DETECTED (Cut Off Level 300 ng/mL) Final   POC Cocaine UR 03/30/2022 None Detected  NONE DETECTED (Cut Off Level 300 ng/mL) Final   POC Methamphetamine UR 03/30/2022 Positive (A)  NONE DETECTED (Cut Off Level 1000 ng/mL) Final   POC Morphine 03/30/2022 None Detected  NONE DETECTED (Cut Off Level 300 ng/mL) Final   POC Methadone UR 03/30/2022 None Detected  NONE DETECTED (Cut Off Level 300 ng/mL) Final   POC Oxycodone UR 03/30/2022 None Detected  NONE DETECTED (Cut Off Level 100 ng/mL) Final   POC Marijuana UR 03/30/2022 Positive (A)  NONE DETECTED (Cut Off Level 50 ng/mL) Final   SARSCOV2ONAVIRUS 2 AG 03/30/2022 NEGATIVE  NEGATIVE Final   Comment: (NOTE) SARS-CoV-2 antigen NOT DETECTED.   Negative results are presumptive.  Negative results do not preclude SARS-CoV-2 infection and should not be used as the sole basis for treatment or other patient management decisions, including infection  control decisions, particularly in the presence of clinical signs and  symptoms consistent with COVID-19, or in those who have been in contact with the virus.  Negative results must be combined with clinical observations, patient history, and epidemiological information. The expected result is Negative.  Fact Sheet for Patients: HandmadeRecipes.com.cy  Fact Sheet for Healthcare Providers: FuneralLife.at  This test is not yet approved or cleared by the [place] FDA and  has been authorized for detection and/or diagnosis of SARS-CoV-2 by FDA under an Emergency Use Authorization (EUA).  This EUA will remain in effect (meaning this test can be used) for  the duration of  the COV                          ID-19 declaration under Section 564(b)(1) of the Act, 21 U.S.C. section 360bbb-3(b)(1), unless the authorization is terminated or revoked sooner.    Admission on 03/29/2022, Discharged on 03/30/2022  Component Date Value Ref Range Status   Sodium 03/29/2022 142  135 - 145 mmol/L Final   Potassium 03/29/2022 3.5  3.5 - 5.1 mmol/L Final   Chloride 03/29/2022 110  98 - 111 mmol/L Final   CO2 03/29/2022 24  22 - 32 mmol/L Final   Glucose, Bld 03/29/2022 108 (H)  70 - 99 mg/dL Final   Glucose reference range applies only to samples taken after fasting for at least 8 hours.   BUN 03/29/2022 13  6 - 20 mg/dL Final   Creatinine, Ser 03/29/2022 1.19  0.61 - 1.24 mg/dL Final   Calcium 03/29/2022 9.2  8.9 - 10.3 mg/dL Final   Total Protein 03/29/2022 7.7  6.5 - 8.1 g/dL Final   Albumin 03/29/2022 4.2  3.5 - 5.0 g/dL Final   AST 03/29/2022 61 (H)  15 - 41 U/L Final   ALT 03/29/2022 48 (H)  0 - 44 U/L Final   Alkaline Phosphatase 03/29/2022 64  38 - 126 U/L Final   Total Bilirubin 03/29/2022 0.9  0.3 - 1.2 mg/dL Final   GFR, Estimated 03/29/2022 >60  >60 mL/min Final   Comment: (NOTE) Calculated using the CKD-EPI Creatinine Equation (2021)    Anion gap 03/29/2022 8  5 - 15 Final   Performed at [facility], [place] [address], [place], [place] [address]   Alcohol, Ethyl (B) 03/29/2022 <10  <10 mg/dL Final   Comment: (NOTE) Lowest detectable limit for serum alcohol is 10 mg/dL.  For medical purposes only. Performed at [facility], [place] [address], [place], [place] [address]    Salicylate Lvl 58/06/9832 <7.0 (L)  7.0 - 30.0 mg/dL Final   Performed at [place] [address], [place], [place] [address]   Acetaminophen (Tylenol), Serum 03/29/2022 <10 (L)  10 - 30 ug/mL Final   Comment: (NOTE) Therapeutic concentrations vary significantly. A range of 10-30 ug/mL  may be an effective  concentration for many patients. However, some  are best treated at concentrations outside of this range. Acetaminophen concentrations >150 ug/mL at 4 hours after ingestion  and >50 ug/mL at 12 hours after ingestion are often associated with  toxic reactions.  Performed at [facility], [place] [address] [place], [place] [address]    WBC 03/29/2022 7.5  4.0 - 10.5 K/uL Final   RBC 03/29/2022 4.21 (L)  4.22 - 5.81 MIL/uL Final   Hemoglobin 03/29/2022 12.2 (L)  13.0 - 17.0 g/dL Final   HCT 03/29/2022 38.1 (L)  39.0 - 52.0 % Final   MCV 03/29/2022 90.5  80.0 - 100.0 fL Final   MCH 03/29/2022 29.0  26.0 - 34.0 pg Final   MCHC 03/29/2022 32.0  30.0 - 36.0 g/dL Final   RDW 03/29/2022 15.3  11.5 - 15.5 % Final   Platelets 03/29/2022 228  150 - 400 K/uL Final   nRBC 03/29/2022 0.0  0.0 - 0.2 % Final   Performed at [facility], [place] [address] [place], [place] [address]   Opiates 03/29/2022 NONE DETECTED  NONE DETECTED Final   Cocaine 03/29/2022 NONE DETECTED  NONE DETECTED Final   Benzodiazepines 03/29/2022 NONE DETECTED  NONE DETECTED Final   Amphetamines 03/29/2022 POSITIVE (A)  NONE DETECTED Final   Tetrahydrocannabinol 03/29/2022 POSITIVE (A)  NONE DETECTED Final   Barbiturates 03/29/2022 NONE DETECTED  NONE DETECTED Final   Comment: (NOTE) DRUG SCREEN FOR MEDICAL PURPOSES ONLY.  IF CONFIRMATION IS NEEDED FOR ANY PURPOSE, NOTIFY LAB WITHIN 5 DAYS.  LOWEST DETECTABLE LIMITS FOR URINE DRUG SCREEN Drug Class                     Cutoff (ng/mL) Amphetamine and metabolites    1000 Barbiturate and metabolites    200 Benzodiazepine                 790 Tricyclics and metabolites     300 Opiates and metabolites        300 Cocaine and metabolites        300 THC                            50 Performed at [facility], [place] [address] [place], [place] [address]    Troponin I (High Sensitivity) 03/29/2022 3  <18 ng/L Final   Comment:  (NOTE) Elevated high sensitivity troponin I (hsTnI) values and significant  changes across serial measurements may suggest ACS but many other  chronic and acute conditions are known to elevate hsTnI results.  Refer to the "Links" section for chest pain algorithms and additional  guidance. Performed at [facility], [place] [address] [place], [place] [address]    SARS Coronavirus 2 by RT PCR 03/29/2022 NEGATIVE  NEGATIVE Final   Comment: (NOTE) SARS-CoV-2 target nucleic acids are NOT DETECTED.  The SARS-CoV-2 RNA is generally detectable in upper and lower respiratory specimens during the acute phase  of infection. The lowest concentration of SARS-CoV-2 viral copies this assay can detect is 250 copies / mL. A negative result does not preclude SARS-CoV-2 infection and should not be used as the sole basis for treatment or other patient management decisions.  A negative result may occur with improper specimen collection / handling, submission of specimen other than nasopharyngeal swab, presence of viral mutation(s) within the areas targeted by this assay, and inadequate number of viral copies (<250 copies / mL). A negative result must be combined with clinical observations, patient history, and epidemiological information.  Fact Sheet for Patients:   https://www.patel.info/  Fact Sheet for Healthcare Providers: https://hall.com/  This test is not yet approved or                           cleared by the [place] FDA and has been authorized for detection and/or diagnosis of SARS-CoV-2 by FDA under an Emergency Use Authorization (EUA).  This EUA will remain in effect (meaning this test can be used) for the duration of the COVID-19 declaration under Section 564(b)(1) of the Act, 21 U.S.C. section 360bbb-3(b)(1), unless the authorization is terminated or revoked sooner.  Performed at [facility], [place]  [address] [place], [place] [address]    SARS Coronavirus 2 by RT PCR 03/30/2022 NEGATIVE  NEGATIVE Final   Comment: (NOTE) SARS-CoV-2 target nucleic acids are NOT DETECTED.  The SARS-CoV-2 RNA is generally detectable in upper respiratory specimens during the acute phase of infection. The lowest concentration of SARS-CoV-2 viral copies this assay can detect is 138 copies/mL. A negative result does not preclude SARS-Cov-2 infection and should not be used as the sole basis for treatment or other patient management decisions. A negative result may occur with  improper specimen collection/handling, submission of specimen other than nasopharyngeal swab, presence of viral mutation(s) within the areas targeted by this assay, and inadequate number of viral copies(<138 copies/mL). A negative result must be combined with clinical observations, patient history, and epidemiological information. The expected result is Negative.  Fact Sheet for Patients:  EntrepreneurPulse.com.au  Fact Sheet for Healthcare Providers:  IncredibleEmployment.be  This test is no                          t yet approved or cleared by the [place] FDA and  has been authorized for detection and/or diagnosis of SARS-CoV-2 by FDA under an Emergency Use Authorization (EUA). This EUA will remain  in effect (meaning this test can be used) for the duration of the COVID-19 declaration under Section 564(b)(1) of the Act, 21 U.S.C.section 360bbb-3(b)(1), unless the authorization is terminated  or revoked sooner.       Influenza A by PCR 03/30/2022 NEGATIVE  NEGATIVE Final   Influenza B by PCR 03/30/2022 NEGATIVE  NEGATIVE Final   Comment: (NOTE) The Xpert Xpress SARS-CoV-2/FLU/RSV plus assay is intended as an aid in the diagnosis of influenza from Nasopharyngeal swab specimens and should not be used as a sole basis for treatment. Nasal washings and aspirates are unacceptable for  Xpert Xpress SARS-CoV-2/FLU/RSV testing.  Fact Sheet for Patients: EntrepreneurPulse.com.au  Fact Sheet for Healthcare Providers: IncredibleEmployment.be  This test is not yet approved or cleared by the [place] FDA and has been authorized for detection and/or diagnosis of SARS-CoV-2 by FDA under an Emergency Use Authorization (EUA). This EUA will remain in effect (meaning this test can be used) for the  duration of the COVID-19 declaration under Section 564(b)(1) of the Act, 21 U.S.C. section 360bbb-3(b)(1), unless the authorization is terminated or revoked.  Performed at [facility], [place] [address], [place], [place] [address]     Blood Alcohol level:  Lab Results  Component Value Date   ETH <10 08/09/2022   ETH <10 31/59/4585    Metabolic Disorder Labs: Lab Results  Component Value Date   HGBA1C 5.7 (H) 08/09/2022   MPG 116.89 08/09/2022   MPG 119.76 01/27/2021   No results found for: "PROLACTIN" Lab Results  Component Value Date   CHOL 139 08/09/2022   TRIG 33 08/09/2022   HDL 61 08/09/2022   CHOLHDL 2.3 08/09/2022   VLDL 7 08/09/2022   LDLCALC 71 08/09/2022   LDLCALC 81 07/06/2022    Therapeutic Lab Levels: No results found for: "LITHIUM" No results found for: "VALPROATE" No results found for: "CBMZ"  Physical Findings   AIMS    Flowsheet Row Admission (Discharged) from 12/21/2020 in [facility] Admission (Discharged) from 03/25/2018 in [facility]  AIMS Total Score 0 0      AUDIT    Flowsheet Row Admission (Discharged) from 12/21/2020 in [facility] ED from 05/02/2020 in [facility] Admission (Discharged) from 03/25/2018 in [facility]  Alcohol Use Disorder Identification Test Final Score (AUDIT) 0 4 0      PHQ2-9    Flowsheet  Row ED from 08/09/2022 in [facility] ED from 07/06/2022 in [facility] ED from 04/11/2022 in [facility] ED from 03/30/2022 in [facility] ED from 05/02/2020 in [facility] DEPT  PHQ-2 Total Score _0 PHQ-9 Total Score _1 Flowsheet Row ED from 08/09/2022 in [facility] ED from 08/01/2022 in [facility] ED from 07/06/2022 in [facility] No Risk No Risk No Risk        Musculoskeletal  Strength & Muscle Tone: within normal limits Gait & Station: normal Patient leans: N/A  Psychiatric Specialty Exam  Presentation  General Appearance:  Appropriate for Environment  Eye Contact: Poor  Speech: Clear and Coherent  Speech Volume: Normal  Handedness: Right   Mood and Affect  Mood: "Fair"  Affect: Congruent   Thought Process  Thought Processes: Coherent; Goal Directed  Descriptions of Associations:Intact  Orientation:Full (Time, Place and Person)  Thought Content:Logical  Diagnosis of Schizophrenia or Schizoaffective disorder in past: No    Hallucinations:No data recorded  Ideas of Reference:None  Suicidal Thoughts: none  Homicidal Thoughts: none   Sensorium  Memory: Immediate Fair; Recent Fair  Judgment: Fair  Insight: Fair   Executive Functions  Concentration: Poor  Attention Span: Poor  Recall: [employer] of Knowledge: Fair  Language: Fair   Psychomotor Activity  Psychomotor Activity: normal   Assets  Assets: Desire for Improvement; Resilience   Sleep  Sleep: fair     Physical Exam  Physical Exam Constitutional:      Appearance: the patient is not toxic-appearing.  Pulmonary:     Effort: Pulmonary effort is normal.  Neurological:      General: No focal deficit present.     Mental Status: the patient is alert and oriented  to person, place, and time.   Review of Systems  Respiratory:  Negative for shortness of breath.   Cardiovascular:  Negative for chest pain.  Gastrointestinal:  Negative for abdominal pain, constipation, diarrhea, nausea and vomiting.  Neurological:  Negative for headaches.    Blood pressure (!) 119/90, pulse 99, temperature 97.7 F (36.5 C), temperature source Oral, resp. rate 18, SpO2 100 %. There is no height or weight on file to calculate BMI.  Treatment Plan Summary: Daily contact with patient to assess and evaluate symptoms and progress in treatment and Medication management  GAD: -Continue Wellbutrin 150 mg daily -Increase Seroquel to 200 mg nightly - Increase gabapentin to 300 mg 3 times daily   Withdrawal: -Continue CIWA -Continue Ativan 1 mg q6 PRN CIWA>10 or Seizure -Continue Imodium 2-4 mg PRN diarrhea -Continue Robaxin 500 mg q8 PRN muscle spasms -Continue Zofran-ODT 4 mg q6 PRN nausea -Continue Thiamine 100 mg daily for nutritional supplementation -Continue Multivitamin daily for nutritional supplementation   -Continue Neosporin PRN to wound on toe -Increase Lipitor to home dose of 80 mg daily -Continue PRN's: Tylenol, Maalox, Atarax, Milk of Magnesia  Disposition: The patient has been rejected from [facility] and is unable to attend [facility] given that he has some insurance LCSW working on residential rehab Patient encouraged to contact [facility] to determine if he can go there after discharge. Discharge date is 10/19.   Corky Sox, MD 08/12/2022 6:26 PM

## 2022-08-12 NOTE — Progress Notes (Addendum)
LCSW Progress Note   [phone] - LCSW contacted Jettie Pagan with [facility], [phone], to make a referral for a residential treatment facility.  Pt was provided a list of residential facilities on 11 August 2022.  Pt took the initiative to make phone calls to find available beds for himself but had a challenge with his healthcare plan being accepted by some of the facilities.  Omelia Blackwater, MSW, [facility] [phone] phone [phone] work phone

## 2022-08-12 NOTE — ED Notes (Signed)
Pt is in the bed sleeping. Respirations are even and unlabored. No acute distress noted. Will continue to monitor for safety. 

## 2022-08-12 NOTE — Progress Notes (Signed)
Patient was resting in bed until a short while ago.  He is now awake and alert without distress or complaint.  He has some anxiety about aftercare as he does not have insurance.  Support provided.

## 2022-08-12 NOTE — ED Notes (Signed)
Pt is in the dayroom watching TV.  Respirations are even and unlabored. No acute distress noted. Will continue to monitor for safety. 

## 2022-08-12 NOTE — Tx Team (Signed)
LCSW and MD met with patient to assess current mood, affect, physical state, and inquire about needs/goals while here in [facility] and after discharge. Patient reports being currently homeless hopping from house to house or living on the street for the last few weeks. Prior to that patient reports he was living with his girlfriend who he reports is currently in treatment for substance use. 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 Patient reports presenting to [facility] with hopes to obtain residential placement upon discharge. 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 Patient denies any current alcohol use. Patient reports being prescribed $RemoveBeforeDE'300mg'ILfvhmqXJTevyfP$  of Wellbutrin, and reported frustration with not being prescribed that dose while he at the [facility]. MD attempted to provide brief counseling to the patient regarding the medicine and its relationship with seizures, however patient was not receptive to the feedback provided. Patient reported experiencing withdrawal symptoms such as shakes, sweating, runny nose, hot/cold flashes, loss of appetite and decrease in sleep when he has not had the right medicine. Patient denies having any other medical issues that would be a barrier to placement.    Patient reports he has received residential placement in the past from [facility], [facility], [facility], [facility], and [facility]. Patient reports he also has been receiving outpatient medication management from [facility] for the last 3 years. Patient reports his current goal is to seek residential treatment for the substance use so that he can get his life back on track. Patient reports he would not be able to afford any copays at this time  for treatment, due to lack of funds. Patient reports he also does not have transportation at this time. Patient aware that LCSW will send referrals out for residential placement and will follow up to provide updates as received. Patient expressed understanding.   Per previous LCSW, referrals have been sent to the following facilities: [facility]- denied  [facility]- awaiting follow up  Patient reports he will follow up with the [facility] regarding availability.   LCSW will continue to follow and provide support to patient while on the [facility].      [name], [facility] Ph: [phone]

## 2022-08-12 NOTE — ED Notes (Signed)
Patient remains asleep in bed without distress or complaint.  Patient did not want to eat breakfast this morning.  He is without withdrawal.  He is calm and pleasant on approach.  Makes needs known.  Will monitor.

## 2022-08-13 DIAGNOSIS — F1920 Other psychoactive substance dependence, uncomplicated: Secondary | ICD-10-CM | POA: Diagnosis not present

## 2022-08-13 DIAGNOSIS — F1120 Opioid dependence, uncomplicated: Secondary | ICD-10-CM | POA: Diagnosis not present

## 2022-08-13 MED ORDER — GABAPENTIN 300 MG PO CAPS: 300.0000 mg | ORAL_CAPSULE | Freq: Three times a day (TID) | ORAL | 0 refills | Status: DC

## 2022-08-13 MED ORDER — QUETIAPINE FUMARATE 200 MG PO TABS: 200.0000 mg | ORAL_TABLET | Freq: Every day | ORAL | 0 refills | Status: DC

## 2022-08-13 MED ORDER — BUPROPION HCL ER (XL) 150 MG PO TB24: 150.0000 mg | ORAL_TABLET | Freq: Every day | ORAL | 0 refills | Status: DC

## 2022-08-13 MED ORDER — HYDROXYZINE HCL 25 MG PO TABS: 25.0000 mg | ORAL_TABLET | Freq: Three times a day (TID) | ORAL | 0 refills | Status: DC | PRN

## 2022-08-13 MED ORDER — LIPITOR 80 MG PO TABS: 80.0000 mg | ORAL_TABLET | Freq: Every day | ORAL | 0 refills | Status: DC

## 2022-08-13 NOTE — [facility] Group Notes (Signed)
Problem Solving & Overcoming Obstacles  Tuesday - Problem Solving & Overcoming Obstacles   Date: 08/13/22  Type of Therapy/Therapeutic Modalities: Group, CBT, Motivational Interviewing, Solution-Focused   Participation Level: Active  Objective - In this group patients will be encouraged to explore what they see as problems/obstacles to their own wellness and recovery. Participants will watch a short motivational video clip about overcoming obstacles. They will be guided to discuss their thoughts, feelings, and behaviors related to these obstacles. The group will process together ways to cope with barriers, with attention given to specific choices patients can make. Each patient will be challenged to identify changes they are motivated to make to overcome their obstacles. This group will be process-oriented, with patients participating in exploration of their own experiences as well as giving and receiving support and challenge from other group members.  Therapeutic Goals:  Patient will identify personal and current obstacles as they relate to admission. Patient will identify barriers that currently interfere with their wellness or overcoming obstacles.  Patient will identify feelings, thought process and behaviors related to these barriers. Patient will identify two changes they are willing to make to overcome these obstacles:   Summary of Patient Progress: Patient actively participated in group on today. Patient was able to define the different ways he could change his thinking to be more positive. Patient spoke of positive affirmations he could say to himself and for himself to keep him focused on his goals. Patient reports finding encouragement from the group video stating he has realized that he has had some "foolish ways", however those experieces have caused him to grow more in wisdom to be more intentional about his life. Patient interacted positively with CSW and his peers. Patient was  also receptive of feedback provided by CSW.  [name], LCSW Clinical Social Worker [facility] Ph: [phone]

## 2022-08-13 NOTE — ED Notes (Signed)
Pt is in the bed sleeping. Respirations are even and unlabored. No acute distress noted. Will continue to monitor for safety. 

## 2022-08-13 NOTE — Progress Notes (Signed)
LCSW received phone call back from [facility] at [facility] [phone] regarding patient referral. Per Nada Boozer, the facility is in network with the patient's insurance and they would like to consider him for possible admission. Nada Boozer was made aware of the patient's anticipated discharge date of tomorrow. Per Nada Boozer, he has arranged for a telephone interview with the patient on today at 3:00pm to discuss what options are available and how the program operates. Nada Boozer also reports that copays and insurance information will be discussed at time of meeting with patient via [facility]. Nada Boozer reports that if patient accepts offer then he will be admitted into their [facility] called [facility] in [place], [place] and will received substance abuse services via their PHP-[facility]. Nada Boozer reports that the Structured Living Home is staffed 24/7 and provides daily and weekend activities for the residents. Nada Boozer reports the home typically requires a $250 week fee, however the insurance advisor will discuss plans and assistance they are able to provide. Nada Boozer was provided the unit contact information in order to reach patient [phone], and was also provided LCSW contact information incase that patient is unable to be reached. If patient accepts bed offer, per [facility] the facility will work on getting the patient transported to their facility on tomorrow.   LCSW went and spoke to patient to provide an update. Patient expressed understanding and appreciation of assistance with seeking treatment for him. Patient is agreeable to meeting at 3:00pm and understands that the facility will call the unit to follow up. No other needs were reported at this time.   Patient did inform LCSW that he is also currently working with [facility], who reports that they would like to visit with the patient to assess on today. Per patient, time is unknown. LCSW will attempt to follow for  further details.   LCSW will continue to follow and provide support to patient while on [facility] unit.   [name], [facility] Ph: [phone]

## 2022-08-13 NOTE — Discharge Instructions (Signed)

## 2022-08-13 NOTE — ED Notes (Signed)
Pt placed call to [facility].  Intake person will be coming to facility to speak with pt.

## 2022-08-13 NOTE — ED Provider Notes (Signed)
FBC/OBS ASAP Discharge Summary  Date and Time: 08/13/2022 6:41 PM  Name: Brian [name]  MRN:  [id number]   Discharge Diagnoses:  Final diagnoses:  Polysubstance dependence including opioid type drug, episodic abuse [facility])  Housing instability after recent homelessness    Subjective:  Brian [name] is a 36 yr old male who presented to [facility] on 10/14 and was accepted to [facility] for Residential Treatment for substance abuse.  PPHx is significant for Bipolar Disorder, GAD, Substance Induced Mood Disorder, and Polysubstance Abuse (EtOH, Opioids, Cocaine, Amphetamines, THC).  He has been to Residential Treatment prior and has a history of Withdrawal Seizures several years ago.  The patient reports that he is currently homeless but has employment as an Special educational needs teacher.  He initially stated that he has [employer], but it turns out that his health plan is a much more limited variety.  The patient reports that he wants residential rehab.  Stay Summary:  The patient was noted to be investment in his treatment plan during the stay.  He was constantly at the phones making calls to find placement.  He is declined at several places due to his insurance.  He was undeterred and found out that friends of [facility] would accept him.  A representative from this organization came to meet with him in person.  The patient requested discharge on the evening of 10/18 stating that this person had agreed to accept him to one of their facilities in [facility].  Called and confirmed the situation with this person and finalize plan for the patient to obtain his medicine from the pharmacy.  The patient was instructed to obtain follow-up at the [place] office.  Total Time spent with patient: 20 minutes  Past Psychiatric History: as above Past Medical History:  Past Medical History:  Diagnosis Date   Alcohol abuse    Anxiety    Bipolar 2 disorder ([place])    Cocaine abuse ([place])     Depression    Kidney stones    Methamphetamine abuse, episodic (HCC)    Myocardial infarction ([place])    Narcotic abuse ([place])    Peptic ulcer    Polysubstance abuse ([place])    Renal disorder    kidney stones    Past Surgical History:  Procedure Laterality Date   ESOPHAGOGASTRODUODENOSCOPY N/A 05/29/2014   Procedure: ESOPHAGOGASTRODUODENOSCOPY (EGD) ;  Surgeon: Beryle Beams, MD;  Location: [facility] ENDOSCOPY;  Service: Endoscopy;  Laterality: N/A;  check with Dr. Collene Mares about sedation type/timinng - I recommend MAC   HAND SURGERY  2006   LEFT HEART CATH AND CORONARY ANGIOGRAPHY N/A 06/21/2020   Procedure: LEFT HEART CATH AND CORONARY ANGIOGRAPHY;  Surgeon: Nelva Bush, MD;  Location: [facility];  Service: Cardiovascular;  Laterality: N/A;   Family History:  Family History  Problem Relation Age of Onset   Heart disease Father    Family Psychiatric History: per H and P Social History:  Social History   Substance and Sexual Activity  Alcohol Use Not Currently     Social History   Substance and Sexual Activity  Drug Use Yes   Types: IV, Methamphetamines    Social History   Socioeconomic History   Marital status: Single    Spouse name: Not on file   Number of children: Not on file   Years of education: Not on file   Highest education level: Not on file  Occupational History   Not on file  Tobacco Use   Smoking status: Every Day    Packs/day: 0.50    Types: Cigarettes   Smokeless tobacco: Never  Vaping Use   Vaping Use: Never used  Substance and Sexual Activity   Alcohol use: Not Currently   Drug use: Yes    Types: IV, Methamphetamines   Sexual activity: Not Currently  Other Topics Concern   Not on file  Social History Narrative   ** Merged History Encounter **       Lives with a friend   Lost house and custody of son 9broke up with fiancee)   Nature conservation officer   Social Determinants of Health   Financial Resource Strain: Not on file  Food Insecurity:  Not on file  Transportation Needs: Not on file  Physical Activity: Not on file  Stress: Not on file  Social Connections: Not on file   SDOH:  [place]: High Risk (08/09/2020)  Alcohol Screen: Low Risk  (12/21/2020)  Depression (PHQ2-9): High Risk (08/09/2022)  Tobacco Use: High Risk (08/01/2022)    Tobacco Cessation:  A prescription for an FDA-approved tobacco cessation medication provided at discharge  Current Medications:  Current Facility-Administered Medications  Medication Dose Route Frequency Provider Last Rate Last Admin   acetaminophen (TYLENOL) tablet 650 mg  650 mg Oral Q6H PRN Onuoha, Chinwendu V, NP   650 mg at 08/09/22 0255   alum & mag hydroxide-simeth (MAALOX/MYLANTA) 200-200-20 MG/5ML suspension 30 mL  30 mL Oral Q4H PRN Onuoha, Chinwendu V, NP       atorvastatin (LIPITOR) tablet 80 mg  80 mg Oral Daily Corky Sox, MD   80 mg at 08/13/22 0916   buPROPion (WELLBUTRIN XL) 24 hr tablet 150 mg  150 mg Oral Daily Derrill Center, NP   150 mg at 08/13/22 0916   dicyclomine (BENTYL) tablet 20 mg  20 mg Oral Q6H PRN Onuoha, Chinwendu V, NP       gabapentin (NEURONTIN) capsule 300 mg  300 mg Oral TID Corky Sox, MD   300 mg at 08/13/22 1508   hydrOXYzine (ATARAX) tablet 25 mg  25 mg Oral Q6H PRN Onuoha, Chinwendu V, NP   25 mg at 08/12/22 1956   loperamide (IMODIUM) capsule 2-4 mg  2-4 mg Oral PRN Onuoha, Chinwendu V, NP       LORazepam (ATIVAN) tablet 1 mg  1 mg Oral Q6H PRN Pashayan, Redgie Grayer, MD       Or   LORazepam (ATIVAN) injection 1 mg  1 mg Intramuscular Q6H PRN Pashayan, Redgie Grayer, MD       magnesium hydroxide (MILK OF MAGNESIA) suspension 30 mL  30 mL Oral Daily PRN Onuoha, Chinwendu V, NP       methocarbamol (ROBAXIN) tablet 500 mg  500 mg Oral Q8H PRN Onuoha, Chinwendu V, NP   500 mg at 08/12/22 1956   neomycin-bacitracin-polymyxin (NEOSPORIN) ointment   Topical PRN Nwoko, Uchenna E, PA       ondansetron (ZOFRAN-ODT) disintegrating  tablet 4 mg  4 mg Oral Q6H PRN Onuoha, Chinwendu V, NP       QUEtiapine (SEROQUEL) tablet 200 mg  200 mg Oral QHS Corky Sox, MD   200 mg at 08/12/22 2102   thiamine (VITAMIN B1) tablet 100 mg  100 mg Oral Daily [name], MD   100 mg at 08/13/22 0916   Current Outpatient Medications  Medication Sig Dispense Refill   [START ON 08/14/2022] buPROPion (WELLBUTRIN XL) 150 MG 24  hr tablet Take 1 tablet (150 mg total) by mouth daily. 30 tablet 0   gabapentin (NEURONTIN) 300 MG capsule Take 1 capsule (300 mg total) by mouth 3 (three) times daily. 90 capsule 0   hydrOXYzine (ATARAX) 25 MG tablet Take 1 tablet (25 mg total) by mouth 3 (three) times daily as needed for anxiety. 30 tablet 0   LIPITOR 80 MG tablet Take 1 tablet (80 mg total) by mouth at bedtime. 30 tablet 0   QUEtiapine (SEROQUEL) 200 MG tablet Take 1 tablet (200 mg total) by mouth at bedtime. 30 tablet 0    PTA Medications: (Not in a hospital admission)      08/09/2022    2:00 PM 07/06/2022    2:35 PM 07/06/2022    2:45 AM  Depression screen PHQ 2/9  Decreased Interest _0 Down, Depressed, Hopeless 1 0 1  PHQ - 2 Score _1 Altered sleeping 2 0 1  Tired, decreased energy 1 0   Change in appetite _2 Feeling bad or failure about yourself  _3 Trouble concentrating _4 Moving slowly or fidgety/restless 0 3 1  Suicidal thoughts 1 0 1  PHQ-9 Score _5 Difficult doing work/chores Extremely dIfficult Very difficult Somewhat difficult    Flowsheet Row ED from 08/09/2022 in [facility] ED from 08/01/2022 in [place] ED from 07/06/2022 in [facility] No Risk No Risk No Risk       Musculoskeletal  Strength & Muscle Tone: within normal limits Gait & Station: normal Patient leans: N/A  Psychiatric Specialty Exam  Presentation  General Appearance:  Appropriate for  Environment  Eye Contact: Good  Speech: Clear and Coherent  Speech Volume: Normal  Handedness: Right   Mood and Affect  Mood: Euthymic  Affect: Congruent   Thought Process  Thought Processes: Coherent; Goal Directed  Descriptions of Associations:Intact  Orientation:Full (Time, Place and Person)  Thought Content:Logical  Diagnosis of Schizophrenia or Schizoaffective disorder in past: No    Hallucinations: none Ideas of Reference:None  Suicidal Thoughts: none Homicidal Thoughts: none  Sensorium  Memory: Immediate Fair; Recent Fair  Judgment: Fair  Insight: Fair   Community education officer  Concentration: Poor  Attention Span: Poor  Recall: [employer] of Knowledge: Fair  Language: Fair   Psychomotor Activity  Psychomotor Activity: normal  Assets  Assets: Desire for Improvement; Resilience   Sleep  Sleep: normal  Physical Exam  Physical Exam Constitutional:      Appearance: the patient is not toxic-appearing.  Pulmonary:     Effort: Pulmonary effort is normal.  Neurological:     General: No focal deficit present.     Mental Status: the patient is alert and oriented to person, place, and time.   Review of Systems  Respiratory:  Negative for shortness of breath.   Cardiovascular:  Negative for chest pain.  Gastrointestinal:  Negative for abdominal pain, constipation, diarrhea, nausea and vomiting.  Neurological:  Negative for headaches.   Blood pressure 96/62, pulse 100, temperature 98.5 F (36.9 C), temperature source Tympanic, resp. rate 18, SpO2 100 %. There is no height or weight on file to calculate BMI.  Demographic Factors:  Male, Low socioeconomic status, and Unemployed  Loss Factors: NA  Historical Factors: NA  Risk Reduction Factors:   Living with another person, especially a relative, Positive social  support, Positive therapeutic relationship, and Positive coping skills or problem solving skills  Continued  Clinical Symptoms:  Alcohol/Substance Abuse/Dependencies  Cognitive Features That Contribute To Risk:  None    Suicide Risk:  Mild: Patient presented with severe substance use problems.  However he has denied suicidal ideation over the past several days and is demonstrated strong engagement in his treatment plan.  He is future oriented.  Demonstrates good behavioral and emotional regulation.  Plan Of Care/Follow-up recommendations:  Activity: as tolerated  Diet: heart healthy  Other: -Follow-up with your outpatient psychiatric provider - information provided in the discharge instructions.   -Take your psychiatric medications as prescribed at discharge - instructions are provided to you in the discharge paperwork.  -Follow-up with outpatient primary care doctor and other specialists -for management of preventative medicine and chronic medical disease.   -Recommend abstinence from alcohol, tobacco, and other illicit drug use at discharge.   -If your psychiatric symptoms recur, worsen, or if you have side effects to your psychiatric medications, call your outpatient psychiatric provider, 911, [phone] or go to the nearest emergency department.   Disposition: to [facility] Laughter, MD 08/13/2022, 6:41 PM

## 2022-08-13 NOTE — Discharge Planning (Signed)
LCSW received update from patient regarding referral sent to [facility]. Per patient, he was not able to be accepted at this time due to issues with his insurance. Per patient, they stated that it would take a few more days to run the insurance to verify coverage. Patient reported that he "did not have that time and was ready to move on with this process". Representative from [facility], Mr. Darden Dates came out an spoke with patient around 3:30pm. Per Representative, patient would be a good candidate for their program and he is willing to admit him on today for further treatment. Representative spoke with the MD regarding the plan and patient is being discharged on today for further treatment. Agency will provide transportation. Patient was appreciative of the assistance provided LCSW and team. Brief supportive counseling was provided to the patient and he was receptive to the feedback provided. No other needs were reported at this time.   LCSW to sign off at this time. Please re-infrom if any further SW arise prior to discharge.   [name], LCSW Clinical Social Worker [facility] Ph: [phone]

## 2022-08-13 NOTE — ED Notes (Signed)
Pt resting quietly in bed.  Notified pt of breakfast being served, pt remained in bed.  Denies, SI, HI, and AVH.  Breathing is even and unlabored.  Will continue to monitor for safety.

## 2022-08-13 NOTE — ED Notes (Signed)
Pt speaking with representative from [facility]

## 2023-01-06 ENCOUNTER — Encounter (HOSPITAL_COMMUNITY): Payer: Self-pay | Admitting: Emergency Medicine

## 2023-01-06 ENCOUNTER — Other Ambulatory Visit: Payer: Self-pay

## 2023-01-06 ENCOUNTER — Emergency Department (HOSPITAL_COMMUNITY)
Admission: EM | Admit: 2023-01-06 | Discharge: 2023-01-06 | Discharge status: Against Medical Advice | Payer: BLUE CROSS/BLUE SHIELD | Attending: Emergency Medicine | Admitting: Emergency Medicine

## 2023-01-06 ENCOUNTER — Emergency Department (HOSPITAL_COMMUNITY): Payer: BLUE CROSS/BLUE SHIELD

## 2023-01-06 DIAGNOSIS — R202 Paresthesia of skin: Secondary | ICD-10-CM | POA: Diagnosis not present

## 2023-01-06 DIAGNOSIS — R079 Chest pain, unspecified: Secondary | ICD-10-CM | POA: Diagnosis not present

## 2023-01-06 DIAGNOSIS — R0602 Shortness of breath: Secondary | ICD-10-CM | POA: Insufficient documentation

## 2023-01-06 DIAGNOSIS — Z5321 Procedure and treatment not carried out due to patient leaving prior to being seen by health care provider: Secondary | ICD-10-CM | POA: Insufficient documentation

## 2023-01-06 LAB — CBC
HCT: 41.9 % (ref 39.0–52.0)
Hemoglobin: 14 g/dL (ref 13.0–17.0)
MCH: 30.4 pg (ref 26.0–34.0)
MCHC: 33.4 g/dL (ref 30.0–36.0)
MCV: 91.1 fL (ref 80.0–100.0)
Platelets: 257 10*3/uL (ref 150–400)
RBC: 4.6 MIL/uL (ref 4.22–5.81)
RDW: 15.2 % (ref 11.5–15.5)
WBC: 8.7 10*3/uL (ref 4.0–10.5)
nRBC: 0 % (ref 0.0–0.2)

## 2023-01-06 LAB — BASIC METABOLIC PANEL
Anion gap: 11 (ref 5–15)
BUN: 10 mg/dL (ref 6–20)
CO2: 26 mmol/L (ref 22–32)
Calcium: 8.7 mg/dL — ABNORMAL LOW (ref 8.9–10.3)
Chloride: 102 mmol/L (ref 98–111)
Creatinine, Ser: 1.24 mg/dL (ref 0.61–1.24)
GFR, Estimated: >60 mL/min (ref >60)
Glucose, Bld: 114 mg/dL — ABNORMAL HIGH (ref 70–99)
Potassium: 3.9 mmol/L (ref 3.5–5.1)
Sodium: 139 mmol/L (ref 135–145)

## 2023-01-06 LAB — TROPONIN I (HIGH SENSITIVITY): Troponin I (High Sensitivity): 3 ng/L (ref <18)

## 2023-01-06 NOTE — ED Provider Triage Note (Signed)
Emergency Medicine Provider Triage Evaluation Note  Brian [name] , a 37 y.o. male  was evaluated in triage.  Pt complains of chest pain short of breath tingling in his hands and feet.  Review of Systems  Positive: Chest pain Negative: Abdominal pain  Physical Exam  BP (!) 149/97 (BP Location: Left Arm)   Pulse 93   Temp 98.7 F (37.1 C) (Oral)   Resp 20   Ht '5\' 8"'$  (1.727 m)   Wt 81.6 kg   SpO2 97%   BMI 27.37 kg/m  Gen:   Awake, no distress   Resp:  Normal effort  MSK:   Moves extremities without difficulty  Other:    Medical Decision Making  Medically screening exam initiated at 8:02 PM.  Appropriate orders placed.  AJAX STASH was informed that the remainder of the evaluation will be completed by another provider, this initial triage assessment does not replace that evaluation, and the importance of remaining in the ED until their evaluation is complete.     Hayden Rasmussen, MD 01/07/23 1052

## 2023-01-06 NOTE — ED Triage Notes (Signed)
Per EMS, was at [facility] meeting (clean for 7 months) started having generalized sharp chest pain that radiated to arms and legs which he states is the same presentation of past two heart attacks.  Pt was breathing 26-28 times a minute.    324 ASA 1 nitro - 190/100 to 146/96 HR 88 CBG 128 RR now 22pm   20G R AC  Pt states he had not been feeling well the past few days.  Also reports had a very "stressful" conversation before his [facility] meeting.

## 2023-01-06 NOTE — ED Notes (Signed)
Pt states that he is going to leave because he feels better

## 2023-04-10 ENCOUNTER — Emergency Department (HOSPITAL_COMMUNITY)
Admission: EM | Admit: 2023-04-10 | Discharge: 2023-04-10 | Disposition: A | Discharge status: Other Acute Inpatient Hospital | Payer: 59 | Attending: Emergency Medicine | Admitting: Emergency Medicine

## 2023-04-10 ENCOUNTER — Encounter (HOSPITAL_COMMUNITY): Payer: Self-pay

## 2023-04-10 ENCOUNTER — Other Ambulatory Visit: Payer: Self-pay

## 2023-04-10 DIAGNOSIS — F1220 Cannabis dependence, uncomplicated: Secondary | ICD-10-CM | POA: Diagnosis not present

## 2023-04-10 DIAGNOSIS — F309 Manic episode, unspecified: Secondary | ICD-10-CM | POA: Diagnosis not present

## 2023-04-10 DIAGNOSIS — F1910 Other psychoactive substance abuse, uncomplicated: Secondary | ICD-10-CM | POA: Diagnosis not present

## 2023-04-10 DIAGNOSIS — F3010 Manic episode without psychotic symptoms, unspecified: Secondary | ICD-10-CM

## 2023-04-10 DIAGNOSIS — S8991XA Unspecified injury of right lower leg, initial encounter: Secondary | ICD-10-CM | POA: Diagnosis not present

## 2023-04-10 DIAGNOSIS — S8002XA Contusion of left knee, initial encounter: Secondary | ICD-10-CM | POA: Insufficient documentation

## 2023-04-10 DIAGNOSIS — F1120 Opioid dependence, uncomplicated: Secondary | ICD-10-CM | POA: Diagnosis not present

## 2023-04-10 DIAGNOSIS — F1520 Other stimulant dependence, uncomplicated: Secondary | ICD-10-CM | POA: Insufficient documentation

## 2023-04-10 DIAGNOSIS — R9431 Abnormal electrocardiogram [ECG] [EKG]: Secondary | ICD-10-CM | POA: Diagnosis not present

## 2023-04-10 DIAGNOSIS — S8001XA Contusion of right knee, initial encounter: Secondary | ICD-10-CM | POA: Diagnosis not present

## 2023-04-10 DIAGNOSIS — X58XXXA Exposure to other specified factors, initial encounter: Secondary | ICD-10-CM | POA: Insufficient documentation

## 2023-04-10 LAB — COMPREHENSIVE METABOLIC PANEL
ALT: 38 U/L (ref 0–44)
AST: 46 U/L — ABNORMAL HIGH (ref 15–41)
Albumin: 4.4 g/dL (ref 3.5–5.0)
Alkaline Phosphatase: 80 U/L (ref 38–126)
Anion gap: 8 (ref 5–15)
BUN: 14 mg/dL (ref 6–20)
CO2: 26 mmol/L (ref 22–32)
Calcium: 9.5 mg/dL (ref 8.9–10.3)
Chloride: 105 mmol/L (ref 98–111)
Creatinine, Ser: 1.71 mg/dL — ABNORMAL HIGH (ref 0.61–1.24)
GFR, Estimated: 52 mL/min — ABNORMAL LOW (ref >60)
Glucose, Bld: 144 mg/dL — ABNORMAL HIGH (ref 70–99)
Potassium: 4 mmol/L (ref 3.5–5.1)
Sodium: 139 mmol/L (ref 135–145)
Total Bilirubin: 0.6 mg/dL (ref 0.3–1.2)
Total Protein: 7.9 g/dL (ref 6.5–8.1)

## 2023-04-10 LAB — CBC
HCT: 42.4 % (ref 39.0–52.0)
Hemoglobin: 13.6 g/dL (ref 13.0–17.0)
MCH: 30 pg (ref 26.0–34.0)
MCHC: 32.1 g/dL (ref 30.0–36.0)
MCV: 93.6 fL (ref 80.0–100.0)
Platelets: 285 10*3/uL (ref 150–400)
RBC: 4.53 MIL/uL (ref 4.22–5.81)
RDW: 13.5 % (ref 11.5–15.5)
WBC: 10.9 10*3/uL — ABNORMAL HIGH (ref 4.0–10.5)
nRBC: 0 % (ref 0.0–0.2)

## 2023-04-10 LAB — ACETAMINOPHEN LEVEL: Acetaminophen (Tylenol), Serum: <10 ug/mL — ABNORMAL LOW (ref 10–30)

## 2023-04-10 LAB — ETHANOL: Alcohol, Ethyl (B): <10 mg/dL (ref <10)

## 2023-04-10 LAB — SALICYLATE LEVEL: Salicylate Lvl: <7 mg/dL — ABNORMAL LOW (ref 7.0–30.0)

## 2023-04-10 MED ORDER — LORAZEPAM 1 MG PO TABS: 1.0000 mg | ORAL_TABLET | ORAL | Status: DC | PRN

## 2023-04-10 MED ORDER — LORAZEPAM 1 MG PO TABS
1.0000 mg | ORAL_TABLET | Freq: Once | ORAL | Status: AC
  Administered 2023-04-10: 1 mg via ORAL
  Filled 2023-04-10: qty 1

## 2023-04-10 MED ORDER — ACETAMINOPHEN 325 MG PO TABS: 650.0000 mg | ORAL_TABLET | ORAL | Status: DC | PRN

## 2023-04-10 MED ORDER — ONDANSETRON HCL 4 MG PO TABS: 4.0000 mg | ORAL_TABLET | Freq: Three times a day (TID) | ORAL | Status: DC | PRN

## 2023-04-10 MED ORDER — ALUM & MAG HYDROXIDE-SIMETH 200-200-20 MG/5ML PO SUSP: 30.0000 mL | Freq: Four times a day (QID) | ORAL | Status: DC | PRN

## 2023-04-10 NOTE — ED Notes (Signed)
Pt tried using the restroom, he comes out stating "I have to pee but nothing will come out"

## 2023-04-10 NOTE — ED Notes (Signed)
Save blue tube in main lab °

## 2023-04-10 NOTE — ED Triage Notes (Addendum)
Patient reports he relapsed on heroin and meth and has been using the past 3-4 days. States he injected into right arm two days ago and woke up with numbness and tingling the morning after, states he is also having tingling in both feet as well. States he wants to go through detox. Also states he is having hives on both legs, states they showed up after relapsing.  During triage, patient answered yes to suicide risk questions, initially denied.
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
## 2023-04-10 NOTE — ED Provider Notes (Signed)
[place] EMERGENCY DEPARTMENT AT [facility] Provider Note   CSN: [id number] Arrival date & time: 04/10/23  1920     History  Chief Complaint  Patient presents with   Numbness   Drug Problem    Brian [name] is a 37 y.o. male.  Pt reports recent drug use.  Pt reports feeling manic.  Pt request treatment. Pt wants to get into a detox facility.  Pt reports he has a rash on his lower legs.  Patient reports that he has bruises behind both knees and he does not know where they came from.  Patient reports that he injected drugs into his left arm and now has some numbness in his fourth and fifth finger.  It has a bruised swollen area on the ulnar aspect of his arm.  Patient reports that he has been clean for an extended period of time and recently relapsed.  The history is provided by the patient. No language interpreter was used.  Drug Problem This is a recurrent problem. The problem occurs constantly. The problem has been gradually worsening. Nothing aggravates the symptoms. Nothing relieves the symptoms. He has tried nothing for the symptoms.       Home Medications Prior to Admission medications   Medication Sig Start Date End Date Taking? Authorizing Provider  buPROPion (WELLBUTRIN XL) 150 MG 24 hr tablet Take 1 tablet (150 mg total) by mouth daily. 08/14/22   Carlyn Reichert, MD  gabapentin (NEURONTIN) 300 MG capsule Take 1 capsule (300 mg total) by mouth 3 (three) times daily. 08/13/22   Carlyn Reichert, MD  hydrOXYzine (ATARAX) 25 MG tablet Take 1 tablet (25 mg total) by mouth 3 (three) times daily as needed for anxiety. 08/13/22   Carlyn Reichert, MD  LIPITOR 80 MG tablet Take 1 tablet (80 mg total) by mouth at bedtime. 08/13/22   Carlyn Reichert, MD  QUEtiapine (SEROQUEL) 200 MG tablet Take 1 tablet (200 mg total) by mouth at bedtime. 08/13/22   Carlyn Reichert, MD  FLUoxetine (PROZAC) 20 MG capsule Take 1 capsule (20 mg total) by mouth daily. For  depression Patient not taking: Reported on 11/04/2019 04/01/18 05/04/20  Armandina Stammer I, NP  sucralfate (CARAFATE) 1 g tablet Take 1 tablet (1 g total) by mouth 4 (four) times daily -  with meals and at bedtime. Patient not taking: Reported on 11/04/2019 08/30/19 05/04/20  Anselm Pancoast, PA-C      Allergies    Fish allergy and Shellfish allergy    Review of Systems   Review of Systems  All other systems reviewed and are negative.   Physical Exam Updated Vital Signs BP (!) 150/113   Pulse (!) 118   Temp 98.3 F (36.8 C) (Oral)   Resp 18   Ht 5\' 7"  (1.702 m)   Wt 72.6 kg   SpO2 99%   BMI 25.06 kg/m  Physical Exam Vitals and nursing note reviewed.  Constitutional:      Appearance: He is well-developed.  HENT:     Head: Normocephalic.     Right Ear: Tympanic membrane normal.     Left Ear: Tympanic membrane normal.     Nose: Nose normal.     Mouth/Throat:     Mouth: Mucous membranes are moist.  Cardiovascular:     Rate and Rhythm: Normal rate and regular rhythm.  Pulmonary:     Effort: Pulmonary effort is normal.  Abdominal:     General: Abdomen is flat. There is no distension.  Musculoskeletal:        General: Swelling and tenderness present.     Cervical back: Normal range of motion.     Comments: Bruising behind bilateral knees.  Swollen area right ulnar aspect mid arm no sign of erythema patient has full range of motion hands and fingers neurovascular neurosensory are intact  Skin:    General: Skin is warm.  Neurological:     General: No focal deficit present.     Mental Status: He is alert and oriented to person, place, and time.  Psychiatric:     Comments: Anxious, agitated     ED Results / Procedures / Treatments   Labs (all labs ordered are listed, but only abnormal results are displayed) Labs Reviewed  COMPREHENSIVE METABOLIC PANEL - Abnormal; Notable for the following components:      Result Value   Glucose, Bld 144 (*)    Creatinine, Ser 1.71 (*)    AST 46  (*)    GFR, Estimated 52 (*)    All other components within normal limits  SALICYLATE LEVEL - Abnormal; Notable for the following components:   Salicylate Lvl <7.0 (*)    All other components within normal limits  ACETAMINOPHEN LEVEL - Abnormal; Notable for the following components:   Acetaminophen (Tylenol), Serum <10 (*)    All other components within normal limits  CBC - Abnormal; Notable for the following components:   WBC 10.9 (*)    All other components within normal limits  ETHANOL  RAPID URINE DRUG SCREEN, HOSP PERFORMED    EKG None  Radiology No results found.  Procedures Procedures    Medications Ordered in ED Medications - No data to display  ED Course/ Medical Decision Making/ A&P                             Medical Decision Making Patient complains of a rash on his lower legs bruises behind his knees.  Swelling to his right arm at an area that he injected drugs and numbness to his right fourth and fifth fingers  Amount and/or Complexity of Data Reviewed Labs: ordered. Decision-making details documented in ED Course.    Details: Labs ordered reviewed and interpreted   I suspect rash is some type of contact dermatitis.  Bruising bilateral legs behind the knees        Final Clinical Impression(s) / ED Diagnoses Final diagnoses:  Manic behavior (HCC)  Substance abuse [facility])    Rx / DC Orders ED Discharge Orders     None         Osie Cheeks 04/10/23 2050    Bethann Berkshire, MD 04/11/23 1126

## 2023-04-11 ENCOUNTER — Other Ambulatory Visit: Payer: Self-pay

## 2023-04-11 ENCOUNTER — Emergency Department (HOSPITAL_COMMUNITY)
Admission: EM | Admit: 2023-04-11 | Discharge: 2023-04-11 | Discharge status: Against Medical Advice | Payer: 59 | Attending: Emergency Medicine | Admitting: Emergency Medicine

## 2023-04-11 ENCOUNTER — Encounter (HOSPITAL_COMMUNITY): Payer: Self-pay | Admitting: Emergency Medicine

## 2023-04-11 ENCOUNTER — Ambulatory Visit (HOSPITAL_COMMUNITY)
Admission: EM | Admit: 2023-04-11 | Discharge: 2023-04-11 | Disposition: A | Discharge status: Home / Self Care | Payer: 59 | Attending: Urology | Admitting: Urology

## 2023-04-11 ENCOUNTER — Other Ambulatory Visit (HOSPITAL_COMMUNITY)
Admission: EM | Admit: 2023-04-11 | Discharge: 2023-04-11 | Disposition: A | Discharge status: Home / Self Care | Payer: 59 | Attending: Psychiatry | Admitting: Psychiatry

## 2023-04-11 DIAGNOSIS — F1914 Other psychoactive substance abuse with psychoactive substance-induced mood disorder: Secondary | ICD-10-CM | POA: Diagnosis not present

## 2023-04-11 DIAGNOSIS — F1990 Other psychoactive substance use, unspecified, uncomplicated: Secondary | ICD-10-CM | POA: Diagnosis present

## 2023-04-11 DIAGNOSIS — F1910 Other psychoactive substance abuse, uncomplicated: Secondary | ICD-10-CM

## 2023-04-11 DIAGNOSIS — R202 Paresthesia of skin: Secondary | ICD-10-CM

## 2023-04-11 DIAGNOSIS — F1994 Other psychoactive substance use, unspecified with psychoactive substance-induced mood disorder: Secondary | ICD-10-CM

## 2023-04-11 LAB — COMPREHENSIVE METABOLIC PANEL
ALT: 36 U/L (ref 0–44)
AST: 48 U/L — ABNORMAL HIGH (ref 15–41)
Albumin: 3.7 g/dL (ref 3.5–5.0)
Alkaline Phosphatase: 70 U/L (ref 38–126)
Anion gap: 12 (ref 5–15)
BUN: 20 mg/dL (ref 6–20)
CO2: 26 mmol/L (ref 22–32)
Calcium: 9.2 mg/dL (ref 8.9–10.3)
Chloride: 101 mmol/L (ref 98–111)
Creatinine, Ser: 1.73 mg/dL — ABNORMAL HIGH (ref 0.61–1.24)
GFR, Estimated: 51 mL/min — ABNORMAL LOW (ref >60)
Glucose, Bld: 93 mg/dL (ref 70–99)
Potassium: 4.1 mmol/L (ref 3.5–5.1)
Sodium: 139 mmol/L (ref 135–145)
Total Bilirubin: 0.3 mg/dL (ref 0.3–1.2)
Total Protein: 6.9 g/dL (ref 6.5–8.1)

## 2023-04-11 LAB — RAPID URINE DRUG SCREEN, HOSP PERFORMED
Amphetamines: POSITIVE — AB
Barbiturates: NOT DETECTED
Benzodiazepines: NOT DETECTED
Cocaine: POSITIVE — AB
Opiates: NOT DETECTED
Tetrahydrocannabinol: POSITIVE — AB

## 2023-04-11 LAB — CBC
HCT: 37.9 % — ABNORMAL LOW (ref 39.0–52.0)
Hemoglobin: 12 g/dL — ABNORMAL LOW (ref 13.0–17.0)
MCH: 30.4 pg (ref 26.0–34.0)
MCHC: 31.7 g/dL (ref 30.0–36.0)
MCV: 95.9 fL (ref 80.0–100.0)
Platelets: 254 10*3/uL (ref 150–400)
RBC: 3.95 MIL/uL — ABNORMAL LOW (ref 4.22–5.81)
RDW: 13.6 % (ref 11.5–15.5)
WBC: 6.8 10*3/uL (ref 4.0–10.5)
nRBC: 0 % (ref 0.0–0.2)

## 2023-04-11 LAB — ACETAMINOPHEN LEVEL: Acetaminophen (Tylenol), Serum: <10 ug/mL — ABNORMAL LOW (ref 10–30)

## 2023-04-11 LAB — SALICYLATE LEVEL: Salicylate Lvl: <7 mg/dL — ABNORMAL LOW (ref 7.0–30.0)

## 2023-04-11 LAB — ETHANOL: Alcohol, Ethyl (B): <10 mg/dL (ref <10)

## 2023-04-11 MED ORDER — MAGNESIUM HYDROXIDE 400 MG/5ML PO SUSP: 30.0000 mL | Freq: Every day | ORAL | Status: DC | PRN

## 2023-04-11 MED ORDER — ONDANSETRON 4 MG PO TBDP: 4.0000 mg | ORAL_TABLET | Freq: Four times a day (QID) | ORAL | Status: DC | PRN

## 2023-04-11 MED ORDER — HYDROXYZINE HCL 25 MG PO TABS
25.0000 mg | ORAL_TABLET | Freq: Three times a day (TID) | ORAL | Status: DC | PRN
  Administered 2023-04-11: 25 mg via ORAL
  Filled 2023-04-11: qty 1

## 2023-04-11 MED ORDER — HYDROXYZINE HCL 25 MG PO TABS: 25.0000 mg | ORAL_TABLET | Freq: Four times a day (QID) | ORAL | Status: DC | PRN

## 2023-04-11 MED ORDER — TRAZODONE HCL 50 MG PO TABS: 50.0000 mg | ORAL_TABLET | Freq: Every evening | ORAL | Status: DC | PRN

## 2023-04-11 MED ORDER — NAPROXEN 500 MG PO TABS: 500.0000 mg | ORAL_TABLET | Freq: Two times a day (BID) | ORAL | Status: DC | PRN

## 2023-04-11 MED ORDER — QUETIAPINE FUMARATE 100 MG PO TABS
100.0000 mg | ORAL_TABLET | Freq: Every day | ORAL | Status: DC
  Administered 2023-04-11: 100 mg via ORAL
  Filled 2023-04-11: qty 1

## 2023-04-11 MED ORDER — ACETAMINOPHEN 325 MG PO TABS: 650.0000 mg | ORAL_TABLET | Freq: Four times a day (QID) | ORAL | Status: DC | PRN

## 2023-04-11 MED ORDER — METHOCARBAMOL 500 MG PO TABS: 500.0000 mg | ORAL_TABLET | Freq: Three times a day (TID) | ORAL | Status: DC | PRN

## 2023-04-11 MED ORDER — ALUM & MAG HYDROXIDE-SIMETH 200-200-20 MG/5ML PO SUSP: 30.0000 mL | ORAL | Status: DC | PRN

## 2023-04-11 MED ORDER — TRAZODONE HCL 50 MG PO TABS
50.0000 mg | ORAL_TABLET | Freq: Every evening | ORAL | Status: DC | PRN
  Administered 2023-04-11: 50 mg via ORAL
  Filled 2023-04-11: qty 1

## 2023-04-11 MED ORDER — DICYCLOMINE HCL 20 MG PO TABS: 20.0000 mg | ORAL_TABLET | Freq: Four times a day (QID) | ORAL | Status: DC | PRN

## 2023-04-11 MED ORDER — QUETIAPINE FUMARATE 100 MG PO TABS: 100.0000 mg | ORAL_TABLET | Freq: Every day | ORAL | Status: DC

## 2023-04-11 MED ORDER — LORAZEPAM 1 MG PO TABS: 1.0000 mg | ORAL_TABLET | Freq: Once | ORAL | Status: DC

## 2023-04-11 MED ORDER — LOPERAMIDE HCL 2 MG PO CAPS: 2.0000 mg | ORAL_CAPSULE | ORAL | Status: DC | PRN

## 2023-04-11 NOTE — ED Triage Notes (Signed)
Pt in with reported pain and numbness to R 3rd-5th fingers after injecting heroine 2 nights ago into R forearm. Pt was seen at [facility] last night, wants [place] to get further workup of R hand problems. Pt also states he needs psychiatric help, states he's been using drugs as a way to OD and die, endorses active SI thoughts. Pt states he also used meth and benzos yesterday afternoon at 4pm

## 2023-04-11 NOTE — ED Provider Notes (Cosign Needed Addendum)
This Clinical research associate was notified by patient's nurse that the patient is upset, agitated and threatening to become physically violent if not given Ativan. This Clinical research associate presented to the unit to discuss treatment plan with patient. Patient is noted to be extremely agitated, using profanities towards Clinical research associate. He is requesting to be discharged and threatening to become physically violent if he doesn't get ativan. Patient is voluntarily and denies suicidal/homicidal ideation, hallucination, and paranoia.

## 2023-04-11 NOTE — ED Notes (Signed)
Patient stated " I will act out so I can get some tranquilizers. I want some damn Ativan. Willadean Carol, NP at bedside. NP offered other detox medication and patient refused. Patient started using profanity towards NP. Patient stated "just discharge me and I'll go to [facility] Patient discharged and belongings returned from locker 6. At discharged patient denied SI/HI and AVH stating "I was just here to go to [facility] I don't know why I told that bitch I was suicidal  because I'm not. I just coming off meth". Patient escorted out of back sally port with security safety maintain.

## 2023-04-11 NOTE — ED Provider Notes (Cosign Needed Addendum)
[facility] Continuous Assessment Admission H&P  Date: 04/11/23 Patient Name: Brian [name] MRN: [id number] Chief Complaint: "detox ans substance abuse treatment"  Diagnoses:  Final diagnoses:  Polysubstance use disorder    HPI: Brian [name] is a 37 year old male with psychiatric history significant for bipolar disorder, polysubstance abuse, depression, and suicidal ideation.  Patient initially presented to [facility] requesting substance abuse treatment; he was evaluated and recommended for admission to [facility] for substance abuse treatment and management of withdrawal symptoms.  Patient was evaluated face-to-face upon his arrival to [facility].  On assessment, patient is alert and oriented x 4.  He is hyper verbal, restless, and pacing back and forth in assessment room. His speech is clear, eye contact is good. Patient's mood is anxious and depress, affect is congruent.  There is no indication the patient is responding to any internal/external stimuli or experiencing any delusional thought content. 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  Patient reports that he was sober for several months but relapsed on methamphetamine about 2-3 months ago.  He also reports that he resumed using fentanyl, crack cocaine, xanax, and suboxone about 2 weeks ago.  Says that he is using both meth and fentanyl via intervenous route.  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 He says he is depressed and ashamed due to ongoing substance abuse. He endorses depressive symptoms of crying spells, irritability, increased anxiety, worthlessness, hopelessness, and poor sleep.  He denies active suicidal ideation; he says he has passive suicidal thoughts on and off but none currently. He says he overdosed on  drugs last night but woke up a few hours later. He says he is ready to seek substance abuse treatment and wants to go to a long term residential substance abuse treatment program. He also denies homicidal ideation, hallucination and paranoia.     Total Time spent with patient: 30 minutes  Musculoskeletal  Strength & Muscle Tone: within normal limits Gait & Station: normal Patient leans: Right  Psychiatric Specialty Exam  Presentation General Appearance:  Appropriate for Environment  Eye Contact: Good  Speech: Pressured  Speech Volume: Normal  Handedness: Right   Mood and Affect  Mood: Anxious  Affect: Congruent   Thought Process  Thought Processes: Coherent  Descriptions of Associations:Intact  Orientation:Full (Time, Place and Person)  Thought Content:WDL  Diagnosis of Schizophrenia or Schizoaffective disorder in past: No   Hallucinations:Hallucinations: Auditory; Visual Description of Auditory Hallucinations: "sounds" Description of Visual Hallucinations: "tings from the corner of my eyes"  Ideas of Reference:None  Suicidal Thoughts:Suicidal Thoughts: Yes, Passive  Homicidal Thoughts:Homicidal Thoughts: No   Sensorium  Memory: Immediate Fair; Recent Fair; Remote Fair  Judgment: Fair  Insight: Fair   Art therapist  Concentration: Fair  Attention Span: Poor  Recall: [employer] of Knowledge: Fair  Language: Fair   Psychomotor Activity  Psychomotor Activity: Psychomotor Activity: Increased; Restlessness   Assets  Assets: Manufacturing systems engineer; Desire for Improvement; Physical Health   Sleep  Sleep: Sleep: Fair Number of Hours of Sleep: 4   Nutritional Assessment (For [facility] and [facility] admissions only) Has the patient had a weight loss or gain of 10 pounds or more in the last 3 months?: No Has the patient had a decrease in food intake/or appetite?: No Does the patient have dental problems?:  No Does the patient have  eating habits or behaviors that may be indicators of an eating disorder including binging or inducing vomiting?: No Has the patient recently lost weight without trying?: 0 Has the patient been eating poorly because of a decreased appetite?: 0 Malnutrition Screening Tool Score: 0    Physical Exam Vitals and nursing note reviewed.  Constitutional:      General: He is not in acute distress.    Appearance: He is well-developed.  HENT:     Head: Normocephalic and atraumatic.  Eyes:     Conjunctiva/sclera: Conjunctivae normal.  Cardiovascular:     Rate and Rhythm: Normal rate.  Pulmonary:     Effort: Pulmonary effort is normal. No respiratory distress.  Abdominal:     General: There is no distension.     Tenderness: There is no abdominal tenderness.  Musculoskeletal:        General: No swelling.     Cervical back: Neck supple.  Skin:    General: Skin is warm and dry.     Capillary Refill: Capillary refill takes less than 2 seconds.  Neurological:     Mental Status: He is alert and oriented to person, place, and time.  Psychiatric:        Attention and Perception: Attention and perception normal.        Mood and Affect: Mood is anxious.        Speech: Speech normal.        Behavior: Behavior is aggressive and hyperactive. Behavior is cooperative.        Thought Content: Thought content normal.    Review of Systems  Constitutional: Negative.   HENT: Negative.    Eyes: Negative.   Respiratory: Negative.    Cardiovascular: Negative.   Gastrointestinal: Negative.   Genitourinary: Negative.   Musculoskeletal: Negative.   Skin: Negative.   Neurological: Negative.   Endo/Heme/Allergies: Negative.   Psychiatric/Behavioral:  Positive for depression and substance abuse. The patient is nervous/anxious.     Blood pressure 108/66, pulse (!) 105, temperature 98.3 F (36.8 C), temperature source Oral, resp. rate 20, SpO2 100 %. There is no height or weight on file to calculate  BMI.  Past Psychiatric History: bipolar disorder, polysubstance abuse (methamphetamine, cocaine, fentanyl, marijuana, alcohol, Xanax, Suboxone), depression, and suicidal ideation   Is the patient at risk to self? No  Has the patient been a risk to self in the past 6 months? Yes .    Has the patient been a risk to self within the distant past? Yes   Is the patient a risk to others? No   Has the patient been a risk to others in the past 6 months? No   Has the patient been a risk to others within the distant past? No   Past Medical History:  Past Medical History:  Diagnosis Date   Alcohol abuse    Anxiety    Bipolar 2 disorder (HCC)    Cocaine abuse (HCC)    Depression    Kidney stones    Methamphetamine abuse, episodic (HCC)    Myocardial infarction (HCC)    Narcotic abuse (HCC)    Peptic ulcer    Polysubstance abuse (HCC)    Renal disorder    kidney stones     Family History:  Family History  Problem Relation Age of Onset   Heart disease Father      Social History:  Social History   Tobacco Use   Smoking status: Every Day  Packs/day: .5    Types: Cigarettes   Smokeless tobacco: Never  Vaping Use   Vaping Use: Never used  Substance Use Topics   Alcohol use: Not Currently   Drug use: Yes    Types: IV, Methamphetamines     Last Labs:  Admission on 04/10/2023, Discharged on 04/10/2023  Component Date Value Ref Range Status   Sodium 04/10/2023 139  135 - 145 mmol/L Final   Potassium 04/10/2023 4.0  3.5 - 5.1 mmol/L Final   Chloride 04/10/2023 105  98 - 111 mmol/L Final   CO2 04/10/2023 26  22 - 32 mmol/L Final   Glucose, Bld 04/10/2023 144 (H)  70 - 99 mg/dL Final   Glucose reference range applies only to samples taken after fasting for at least 8 hours.   BUN 04/10/2023 14  6 - 20 mg/dL Final   Creatinine, Ser 04/10/2023 1.71 (H)  0.61 - 1.24 mg/dL Final   Calcium 09/81/1914 9.5  8.9 - 10.3 mg/dL Final   Total Protein 78/29/5621 7.9  6.5 - 8.1 g/dL Final    Albumin 30/86/5784 4.4  3.5 - 5.0 g/dL Final   AST 69/62/9528 46 (H)  15 - 41 U/L Final   ALT 04/10/2023 38  0 - 44 U/L Final   Alkaline Phosphatase 04/10/2023 80  38 - 126 U/L Final   Total Bilirubin 04/10/2023 0.6  0.3 - 1.2 mg/dL Final   GFR, Estimated 04/10/2023 52 (L)  >60 mL/min Final   Comment: (NOTE) Calculated using the CKD-EPI Creatinine Equation (2021)    Anion gap 04/10/2023 8  5 - 15 Final   Performed at [facility], [address] [place], [place] [address]   Alcohol, Ethyl (B) 04/10/2023 <10  <10 mg/dL Final   Comment: (NOTE) Lowest detectable limit for serum alcohol is 10 mg/dL.  For medical purposes only. Performed at [facility] [address] [place], [place] [address]    Salicylate Lvl 04/10/2023 <7.0 (L)  7.0 - 30.0 mg/dL Final   Performed at [facility], [address], [place], [place] [address]   Acetaminophen (Tylenol), Serum 04/10/2023 <10 (L)  10 - 30 ug/mL Final   Comment: (NOTE) Therapeutic concentrations vary significantly. A range of 10-30 ug/mL  may be an effective concentration for many patients. However, some  are best treated at concentrations outside of this range. Acetaminophen concentrations >150 ug/mL at 4 hours after ingestion  and >50 ug/mL at 12 hours after ingestion are often associated with  toxic reactions.  Performed at [facility], [address] [place], [place] [address]    WBC 04/10/2023 10.9 (H)  4.0 - 10.5 K/uL Final   RBC 04/10/2023 4.53  4.22 - 5.81 MIL/uL Final   Hemoglobin 04/10/2023 13.6  13.0 - 17.0 g/dL Final   HCT 47/42/5956 42.4  39.0 - 52.0 % Final   MCV 04/10/2023 93.6  80.0 - 100.0 fL Final   MCH 04/10/2023 30.0  26.0 - 34.0 pg Final   MCHC 04/10/2023 32.1  30.0 - 36.0 g/dL Final   RDW 38/75/6433 13.5  11.5 - 15.5 % Final   Platelets 04/10/2023 285  150 - 400 K/uL Final   nRBC 04/10/2023 0.0  0.0 - 0.2 % Final   Performed at  [facility], [address], [place], [place] [address]  Admission on 01/06/2023, Discharged on 01/06/2023  Component Date Value Ref Range Status   Sodium 01/06/2023 139  135 - 145  mmol/L Final   Potassium 01/06/2023 3.9  3.5 - 5.1 mmol/L Final   Chloride 01/06/2023 102  98 - 111 mmol/L Final   CO2 01/06/2023 26  22 - 32 mmol/L Final   Glucose, Bld 01/06/2023 114 (H)  70 - 99 mg/dL Final   Glucose reference range applies only to samples taken after fasting for at least 8 hours.   BUN 01/06/2023 10  6 - 20 mg/dL Final   Creatinine, Ser 01/06/2023 1.24  0.61 - 1.24 mg/dL Final   Calcium 16/07/9603 8.7 (L)  8.9 - 10.3 mg/dL Final   GFR, Estimated 01/06/2023 >60  >60 mL/min Final   Comment: (NOTE) Calculated using the CKD-EPI Creatinine Equation (2021)    Anion gap 01/06/2023 11  5 - 15 Final   Performed at [facility], [address] [place], [place] [address]   WBC 01/06/2023 8.7  4.0 - 10.5 K/uL Final   RBC 01/06/2023 4.60  4.22 - 5.81 MIL/uL Final   Hemoglobin 01/06/2023 14.0  13.0 - 17.0 g/dL Final   HCT 11/91/4782 41.9  39.0 - 52.0 % Final   MCV 01/06/2023 91.1  80.0 - 100.0 fL Final   MCH 01/06/2023 30.4  26.0 - 34.0 pg Final   MCHC 01/06/2023 33.4  30.0 - 36.0 g/dL Final   RDW 95/62/1308 15.2  11.5 - 15.5 % Final   Platelets 01/06/2023 257  150 - 400 K/uL Final   nRBC 01/06/2023 0.0  0.0 - 0.2 % Final   Performed at [facility], [address], [place], [place] [address]   Troponin I (High Sensitivity) 01/06/2023 3  <18 ng/L Final   Comment: (NOTE) Elevated high sensitivity troponin I (hsTnI) values and significant  changes across serial measurements may suggest ACS but many other  chronic and acute conditions are known to elevate hsTnI results.  Refer to the "Links" section for chest pain algorithms and additional  guidance. Performed at [facility], [address], [place], [place] [address]     Allergies: Fish allergy and  Shellfish allergy  Medications:  PTA Medications  Medication Sig   gabapentin (NEURONTIN) 300 MG capsule Take 1 capsule (300 mg total) by mouth 3 (three) times daily.   hydrOXYzine (ATARAX) 25 MG tablet Take 1 tablet (25 mg total) by mouth 3 (three) times daily as needed for anxiety.   LIPITOR 80 MG tablet Take 1 tablet (80 mg total) by mouth at bedtime.   QUEtiapine (SEROQUEL) 200 MG tablet Take 1 tablet (200 mg total) by mouth at bedtime.   isosorbide dinitrate (ISORDIL) 30 MG tablet Take 15 mg by mouth daily.   buPROPion (WELLBUTRIN XL) 300 MG 24 hr tablet Take 300 mg by mouth daily.   Facility Ordered Medications  Medication   acetaminophen (TYLENOL) tablet 650 mg   alum & mag hydroxide-simeth (MAALOX/MYLANTA) 200-200-20 MG/5ML suspension 30 mL   magnesium hydroxide (MILK OF MAGNESIA) suspension 30 mL   traZODone (DESYREL) tablet 50 mg   hydrOXYzine (ATARAX) tablet 25 mg   QUEtiapine (SEROQUEL) tablet 100 mg      Medical Decision Making  Patient is restless, hyper verbal with slightly pressured speech; he will be admitted to continuous assessment and reevaluated for appropriateness for [facility] tomorrow by psychiatry.  Initiate clonidine withdrawal protocol -clonidine taper -hydroxyzine 25 mg PO every 6 hours prn for anxiety -ondansetron 4 mg ODT every 6 hours prn nausea/vomiting -naproxen 500 mg BID prn for pain -dicyclomine 20 mg PO every  6 hours prn abdominal cramping -loperamide 2-4 mg capsule prn diarrhea -methocarbamol 500 mg PO every 8 hours prn spasms  -resume home medication as appropriate. Wellbutrin XL 300mg /day Gabapentin TID Isordil 15mg /day Lipitor 80mg /day   Patient says he has not taken Seroquel in a while--will start at Seroquel 100mg /night and increased to Seroquel 200mg /night as tolerated      Recommendations  Based on my evaluation the patient does not appear to have an emergency medical condition.  Maricela Bo, NP 04/11/23  5:48 AM

## 2023-04-11 NOTE — ED Notes (Signed)
Pt wanded by security. 

## 2023-04-11 NOTE — ED Triage Notes (Signed)
Pt presents to [facility] for admission to [facility]. Pt reports substance use and history of manic behavior. Pt is urgent.

## 2023-04-11 NOTE — ED Notes (Signed)
Patient banging on things yelling and screaming "I need benzo's". Patient states that I am coming down on meth. Patient asked to be discharged if he could not have benzo's.

## 2023-04-11 NOTE — ED Provider Notes (Signed)
[place] EMERGENCY DEPARTMENT AT [facility] Provider Note   CSN: [id number] Arrival date & time: 04/11/23  4098     History  Chief Complaint  Patient presents with   Hand Pain   Psychiatric Evaluation    Brian [name] is a 37 y.o. male.  Patient c/o numbness/tingling to ring and small fingers of right hand for past several days. Indicates sensation comes and goes. Pt not able to identify any consistent exacerbating or alleviating factors. Indicates had injected drugs to area a few days ago, but states he did not miss the vein. Hx heroin, meth and bzd abuse intermittently. Denies recent injury to elbow or wrist. No recent new/repetitive use injury, but states does work in [employer]. No swelling/redness to arm. Denies neck or radicular pain. No loss of use or loss of normal rom of hand and digits. No fever or chills.   The history is provided by the patient and medical records.  Hand Pain Pertinent negatives include no chest pain, no headaches and no shortness of breath.       Home Medications Prior to Admission medications   Medication Sig Start Date End Date Taking? Authorizing Provider  buPROPion (WELLBUTRIN XL) 300 MG 24 hr tablet Take 300 mg by mouth daily.    [provider]  gabapentin (NEURONTIN) 300 MG capsule Take 1 capsule (300 mg total) by mouth 3 (three) times daily. 08/13/22   Carlyn Reichert, MD  hydrOXYzine (ATARAX) 25 MG tablet Take 1 tablet (25 mg total) by mouth 3 (three) times daily as needed for anxiety. 08/13/22   Carlyn Reichert, MD  isosorbide dinitrate (ISORDIL) 30 MG tablet Take 15 mg by mouth daily.    [provider]  LIPITOR 80 MG tablet Take 1 tablet (80 mg total) by mouth at bedtime. 08/13/22   Carlyn Reichert, MD  QUEtiapine (SEROQUEL) 200 MG tablet Take 1 tablet (200 mg total) by mouth at bedtime. 08/13/22   Carlyn Reichert, MD  FLUoxetine (PROZAC) 20 MG capsule Take 1 capsule (20 mg total) by mouth daily. For  depression Patient not taking: Reported on 11/04/2019 04/01/18 05/04/20  Armandina Stammer I, NP  sucralfate (CARAFATE) 1 g tablet Take 1 tablet (1 g total) by mouth 4 (four) times daily -  with meals and at bedtime. Patient not taking: Reported on 11/04/2019 08/30/19 05/04/20  Anselm Pancoast, PA-C      Allergies    Fish allergy and Shellfish allergy    Review of Systems   Review of Systems  Constitutional:  Negative for fever.  Eyes:  Negative for visual disturbance.  Respiratory:  Negative for shortness of breath.   Cardiovascular:  Negative for chest pain.  Gastrointestinal:  Negative for diarrhea and vomiting.  Musculoskeletal:  Negative for neck pain.  Skin:  Negative for rash and wound.  Neurological:  Positive for numbness. Negative for speech difficulty, weakness and headaches.    Physical Exam Updated Vital Signs BP 106/82   Pulse (!) 110   Temp 98.1 F (36.7 C) (Oral)   Resp 16   Wt 72.6 kg   SpO2 97%   BMI 25.06 kg/m  Physical Exam Vitals and nursing note reviewed.  Constitutional:      Appearance: Normal appearance. He is well-developed.  HENT:     Head: Atraumatic.     Nose: Nose normal.     Mouth/Throat:     Mouth: Mucous membranes are moist.  Eyes:     General: No scleral icterus.  Conjunctiva/sclera: Conjunctivae normal.  Neck:     Trachea: No tracheal deviation.  Cardiovascular:     Rate and Rhythm: Normal rate.     Pulses: Normal pulses.  Pulmonary:     Effort: Pulmonary effort is normal. No accessory muscle usage or respiratory distress.  Musculoskeletal:        General: No swelling.     Cervical back: Normal range of motion and neck supple. No rigidity or tenderness.     Comments: C spine non tender, aligned. Normal rom neck without pain.  Good passive rom right shoulder, elbow and wrist without pain. No RUE swelling. Compartments of arm and forearm and soft, not tense, no significant sts noted. Radial pulse 2+, normal cap refill in digits. Normal rom/normal  tendon fxn of hand/digits.   Skin:    General: Skin is warm and dry.     Findings: No rash.  Neurological:     Mental Status: He is alert.     Comments: Alert, speech clear. Motor/sens grossly intact bil. Steady gait. RUE/hand nvi. Pt indicates can feel light touch and pressure to all digits, but that digits 4/5 feel tingly numb sensation. Motor fxn intact, stre 5/5.   Psychiatric:     Comments: Mildly anxious appearing (pt notes hx anxiety/stress). No thoughts of harm to self/others, no SI/HI.      ED Results / Procedures / Treatments   Labs (all labs ordered are listed, but only abnormal results are displayed) Results for orders placed or performed during the hospital encounter of 04/11/23  cbc  Result Value Ref Range   WBC 6.8 4.0 - 10.5 K/uL   RBC 3.95 (L) 4.22 - 5.81 MIL/uL   Hemoglobin 12.0 (L) 13.0 - 17.0 g/dL   HCT 16.1 (L) 09.6 - 04.5 %   MCV 95.9 80.0 - 100.0 fL   MCH 30.4 26.0 - 34.0 pg   MCHC 31.7 30.0 - 36.0 g/dL   RDW 40.9 81.1 - 91.4 %   Platelets 254 150 - 400 K/uL   nRBC 0.0 0.0 - 0.2 %      EKG None  Radiology No results found.  Procedures Procedures    Medications Ordered in ED Medications  LORazepam (ATIVAN) tablet 1 mg (has no administration in time range)    ED Course/ Medical Decision Making/ A&P                             Medical Decision Making Problems Addressed: Paresthesias in right hand: acute illness or injury with systemic symptoms Polysubstance abuse (HCC): acute illness or injury with systemic symptoms that poses a threat to life or bodily functions    Details: Acute/chronic Substance induced mood disorder (HCC): acute illness or injury  Amount and/or Complexity of Data Reviewed External Data Reviewed: notes. Labs: ordered. Decision-making details documented in ED Course.  Risk OTC drugs. Prescription drug management.    Differential diagnosis includes  cubital tunnel syndrome, ulnar nerve syndrome, etc.    Reviewed nursing notes and prior charts for additional history. External reports reviewed.   Labs reviewed/interpreted by me - wbc normal. Chem normal.   Pt also notes stress related to substance use disorder. Denies daily etoh, bzd or opiate use. Pt w normal mood/affect. Pt does not appear acutely depressed or despondent. Pt with no thoughts of self harm or harm to others. Pt does not appear to be responding to internal stimuli - no delusions or hallucinations noted. No  acute psychosis. Normal appetite, no wt loss. Able to sleep. Discussed tx/therapy for substance use disorder - will provide resources for both inpatient and outpatient programs - pt encouraged to pursue program. Additional beh health resources also provided (on chart review, recently evaluated/cleared by [facility] team, feeling inpatient  tx not indicated, and rec outpatient tx/follow up).   Pt asking for something to eat/drink - provided.   Went to reassess patient, pt had left ED prior to labs resulting and/or prior to further eval/tx without notifying medical staff.            Final Clinical Impression(s) / ED Diagnoses Final diagnoses:  Paresthesias in right hand  Polysubstance abuse (HCC)  Substance induced mood disorder [facility])    Rx / DC Orders ED Discharge Orders     None         Cathren Laine, MD 04/11/23 [phone]

## 2023-04-11 NOTE — Discharge Instructions (Signed)

## 2023-04-11 NOTE — Progress Notes (Signed)
   04/11/23 0010  [facility] Triage Screening (Walk-ins at [facility] only)  How Did You Hear About [place]? Hospital Discharge  What Is the Reason for Your Visit/Call Today? Pt presents to [facility] for admission to [facility]. Pt reports substance use and history of manic behavior. Pt is urgent.  How Long Has This Been Causing You Problems? <Week  Have You Recently Had Any Thoughts About Hurting Yourself? Yes  How long ago did you have thoughts about hurting yourself? yesterday  Are You Planning to Commit Suicide/Harm Yourself At This time? No  Have you Recently Had Thoughts About Hurting Someone [name]? No  Are You Planning To Harm Someone At This Time? No  Are you currently experiencing any auditory, visual or other hallucinations? No  Do you have any current medical co-morbidities that require immediate attention? Yes  Please describe current medical co-morbidities that require immediate attention: Pt reports numbness in right hand and feet due to drug use recently.  Clinician description of patient physical appearance/behavior: Patient is cooperative with rapid speech & conversation.  What Do You Feel Would Help You the Most Today? Treatment for Depression or other mood problem;Alcohol or Drug Use Treatment  If access to [facility] was not available, would you have sought care in the Emergency Department? Yes  Determination of Need Urgent (48 hours)  Options For Referral Facility-Based Crisis

## 2023-04-11 NOTE — Discharge Instructions (Addendum)
It was our pleasure to provide your ER care today - we hope that you feel better. Make sure to drink plenty of fluids/stay well hydrated.   Avoid drug use as it is harmful to your physical health and mental well-being. See resource guide attached in terms of accessing inpatient or outpatient substance use treatment programs.   Follow up closely with primary care doctor and behavioral health provider in the coming week. For numbness/tingling sensation to fingers - rest right hand/arm and avoid repetitive use of hand/wrist/elbow area for the next week (and do not inject anything into hand or arm). Follow up with hand specialist in the coming week if symptoms fail to improve/resolve.   For mental health issues and/or crisis, you may also go directly to the Behavioral Health Urgent Care Center - they are open 24/7 and walk-ins are welcome.    Return to ER if worse, new symptoms, fevers, chest pain, trouble breathing, or other emergency concern.    You were given medication in the ER - no driving for the next 6 hours.

## 2023-04-14 ENCOUNTER — Emergency Department (HOSPITAL_COMMUNITY)
Admission: EM | Admit: 2023-04-14 | Discharge: 2023-04-15 | Disposition: A | Discharge status: Home / Self Care | Payer: 59 | Attending: Emergency Medicine | Admitting: Emergency Medicine

## 2023-04-14 ENCOUNTER — Encounter (HOSPITAL_COMMUNITY): Payer: Self-pay | Admitting: Emergency Medicine

## 2023-04-14 DIAGNOSIS — F1210 Cannabis abuse, uncomplicated: Secondary | ICD-10-CM | POA: Insufficient documentation

## 2023-04-14 DIAGNOSIS — F1510 Other stimulant abuse, uncomplicated: Secondary | ICD-10-CM | POA: Insufficient documentation

## 2023-04-14 DIAGNOSIS — F1410 Cocaine abuse, uncomplicated: Secondary | ICD-10-CM | POA: Insufficient documentation

## 2023-04-14 DIAGNOSIS — R000 Tachycardia, unspecified: Secondary | ICD-10-CM | POA: Diagnosis not present

## 2023-04-14 DIAGNOSIS — F1120 Opioid dependence, uncomplicated: Secondary | ICD-10-CM | POA: Insufficient documentation

## 2023-04-14 DIAGNOSIS — F1910 Other psychoactive substance abuse, uncomplicated: Secondary | ICD-10-CM | POA: Diagnosis not present

## 2023-04-14 LAB — COMPREHENSIVE METABOLIC PANEL
ALT: 45 U/L — ABNORMAL HIGH (ref 0–44)
AST: 52 U/L — ABNORMAL HIGH (ref 15–41)
Albumin: 4 g/dL (ref 3.5–5.0)
Alkaline Phosphatase: 75 U/L (ref 38–126)
Anion gap: 7 (ref 5–15)
BUN: 10 mg/dL (ref 6–20)
CO2: 24 mmol/L (ref 22–32)
Calcium: 9.1 mg/dL (ref 8.9–10.3)
Chloride: 107 mmol/L (ref 98–111)
Creatinine, Ser: 1.22 mg/dL (ref 0.61–1.24)
GFR, Estimated: >60 mL/min (ref >60)
Glucose, Bld: 127 mg/dL — ABNORMAL HIGH (ref 70–99)
Potassium: 3.7 mmol/L (ref 3.5–5.1)
Sodium: 138 mmol/L (ref 135–145)
Total Bilirubin: 0.4 mg/dL (ref 0.3–1.2)
Total Protein: 7.6 g/dL (ref 6.5–8.1)

## 2023-04-14 LAB — ETHANOL: Alcohol, Ethyl (B): <10 mg/dL (ref <10)

## 2023-04-14 LAB — CBC
HCT: 40.7 % (ref 39.0–52.0)
Hemoglobin: 13.3 g/dL (ref 13.0–17.0)
MCH: 30.2 pg (ref 26.0–34.0)
MCHC: 32.7 g/dL (ref 30.0–36.0)
MCV: 92.5 fL (ref 80.0–100.0)
Platelets: 225 10*3/uL (ref 150–400)
RBC: 4.4 MIL/uL (ref 4.22–5.81)
RDW: 14 % (ref 11.5–15.5)
WBC: 3.6 10*3/uL — ABNORMAL LOW (ref 4.0–10.5)
nRBC: 0 % (ref 0.0–0.2)

## 2023-04-14 LAB — ACETAMINOPHEN LEVEL: Acetaminophen (Tylenol), Serum: <10 ug/mL — ABNORMAL LOW (ref 10–30)

## 2023-04-14 LAB — SALICYLATE LEVEL: Salicylate Lvl: <7 mg/dL — ABNORMAL LOW (ref 7.0–30.0)

## 2023-04-14 NOTE — ED Notes (Signed)
Attempt to get urine sample from pt. Pt was unable to urinate at the time.

## 2023-04-14 NOTE — ED Triage Notes (Signed)
Pt started injecting meth and heroine again. Last used 4pm. States has bed at [facility] but needs to detox first.

## 2023-04-14 NOTE — ED Notes (Signed)
Pt has 2 personal belonging bags and one medium sized duffle bag.

## 2023-04-14 NOTE — ED Notes (Signed)
Pt has been wanded by security. 

## 2023-04-14 NOTE — ED Notes (Signed)
Pt has been changed into burgundy scrubs.   

## 2023-04-15 MED ORDER — NAPROXEN 500 MG PO TABS: 500.0000 mg | ORAL_TABLET | Freq: Two times a day (BID) | ORAL | Status: DC | PRN

## 2023-04-15 MED ORDER — LORAZEPAM 1 MG PO TABS
1.0000 mg | ORAL_TABLET | Freq: Once | ORAL | Status: AC
  Administered 2023-04-15: 1 mg via ORAL
  Filled 2023-04-15: qty 1

## 2023-04-15 MED ORDER — HYDROXYZINE HCL 25 MG PO TABS: 25.0000 mg | ORAL_TABLET | Freq: Four times a day (QID) | ORAL | Status: DC | PRN

## 2023-04-15 MED ORDER — CLONIDINE HCL 0.1 MG PO TABS: 0.1000 mg | ORAL_TABLET | Freq: Two times a day (BID) | ORAL | Status: DC

## 2023-04-15 MED ORDER — METHOCARBAMOL 500 MG PO TABS: 500.0000 mg | ORAL_TABLET | Freq: Three times a day (TID) | ORAL | Status: DC | PRN

## 2023-04-15 MED ORDER — DICYCLOMINE HCL 20 MG PO TABS: 20.0000 mg | ORAL_TABLET | Freq: Four times a day (QID) | ORAL | Status: DC | PRN

## 2023-04-15 MED ORDER — ONDANSETRON 4 MG PO TBDP: 4.0000 mg | ORAL_TABLET | Freq: Four times a day (QID) | ORAL | Status: DC | PRN

## 2023-04-15 MED ORDER — CLONIDINE HCL 0.1 MG PO TABS: 0.1000 mg | ORAL_TABLET | Freq: Every day | ORAL | Status: DC

## 2023-04-15 MED ORDER — CLONIDINE HCL 0.1 MG PO TABS: 0.1000 mg | ORAL_TABLET | Freq: Four times a day (QID) | ORAL | Status: DC

## 2023-04-15 MED ORDER — LOPERAMIDE HCL 2 MG PO CAPS: 2.0000 mg | ORAL_CAPSULE | ORAL | Status: DC | PRN

## 2023-04-15 NOTE — Discharge Instructions (Signed)
Psychiatry is recommending that you follow-up with outpatient detox centers, we do not offer this in the hospital. I have attached resource guide for you.

## 2023-04-15 NOTE — ED Provider Notes (Signed)
[place] EMERGENCY DEPARTMENT AT [facility] Provider Note   CSN: [id number] Arrival date & time: 04/14/23  2203     History  Chief Complaint  Patient presents with   Drug Problem    Brian [name] is a 37 y.o. male.  The history is provided by the patient and medical records.  Drug Problem   38 year old male with history of polysubstance abuse, bipolar disorder, depression, GERD, paranoia, presenting to the ED for substance abuse issues.  Patient states he has been clean for several years and recently relapsed, has started using meth, heroin, and fentanyl again.  He last used today around 4 PM.  He states all of this came about due to separation from his "old lady".  He states he is recently been seen at [facility] facility, however "they don't do anything but keep me for a  day and toss me out".  He states he has a bed at [facility] for rehab but has to detox first.  When asked about SI he is rather vague but reports "yes and no".  States he feels like deep down he knows not to hurt himself but often when injecting drugs he wishes he would just "go on and fade away".  He denies attempted intentional OD recently.  No HI/AVH.  Denies alcohol use.  Home Medications Prior to Admission medications   Medication Sig Start Date End Date Taking? Authorizing Provider  buPROPion (WELLBUTRIN XL) 300 MG 24 hr tablet Take 300 mg by mouth daily.   Yes [provider]  gabapentin (NEURONTIN) 300 MG capsule Take 1 capsule (300 mg total) by mouth 3 (three) times daily. 08/13/22  Yes Carlyn Reichert, MD  hydrOXYzine (ATARAX) 25 MG tablet Take 1 tablet (25 mg total) by mouth 3 (three) times daily as needed for anxiety. 08/13/22  Yes Carlyn Reichert, MD  isosorbide dinitrate (ISORDIL) 30 MG tablet Take 15 mg by mouth daily.   Yes [provider]  LIPITOR 80 MG tablet Take 1 tablet (80 mg total) by mouth at bedtime. 08/13/22  Yes Carlyn Reichert, MD  QUEtiapine (SEROQUEL)  200 MG tablet Take 1 tablet (200 mg total) by mouth at bedtime. 08/13/22  Yes Carlyn Reichert, MD  FLUoxetine (PROZAC) 20 MG capsule Take 1 capsule (20 mg total) by mouth daily. For depression Patient not taking: Reported on 11/04/2019 04/01/18 05/04/20  Armandina Stammer I, NP  sucralfate (CARAFATE) 1 g tablet Take 1 tablet (1 g total) by mouth 4 (four) times daily -  with meals and at bedtime. Patient not taking: Reported on 11/04/2019 08/30/19 05/04/20  Anselm Pancoast, PA-C      Allergies    Fish allergy and Shellfish allergy    Review of Systems   Review of Systems  Psychiatric/Behavioral:  Positive for suicidal ideas.        Polysubstance abuse  All other systems reviewed and are negative.   Physical Exam Updated Vital Signs BP (!) 124/95 (BP Location: Right Arm)   Pulse (!) 115   Temp 99.2 F (37.3 C) (Oral)   Resp 18   SpO2 97%   Physical Exam Vitals and nursing note reviewed.  Constitutional:      Appearance: He is well-developed.  HENT:     Head: Normocephalic and atraumatic.  Eyes:     Conjunctiva/sclera: Conjunctivae normal.     Pupils: Pupils are equal, round, and reactive to light.  Cardiovascular:     Rate and Rhythm: Normal rate and regular  rhythm.     Heart sounds: Normal heart sounds.  Pulmonary:     Effort: Pulmonary effort is normal.     Breath sounds: Normal breath sounds.  Abdominal:     General: Bowel sounds are normal.     Palpations: Abdomen is soft.  Musculoskeletal:        General: Normal range of motion.     Cervical back: Normal range of motion.  Skin:    General: Skin is warm and dry.  Neurological:     Mental Status: He is alert and oriented to person, place, and time.  Psychiatric:     Comments: Fidgeting a lot during exam, seems a bit paranoid-- looking over his shoulder frequently, looking around before speaking, talking quietly, etc. Vague SI without active plan Denies HI/AVH     ED Results / Procedures / Treatments   Labs (all labs ordered  are listed, but only abnormal results are displayed) Labs Reviewed  COMPREHENSIVE METABOLIC PANEL - Abnormal; Notable for the following components:      Result Value   Glucose, Bld 127 (*)    AST 52 (*)    ALT 45 (*)    All other components within normal limits  SALICYLATE LEVEL - Abnormal; Notable for the following components:   Salicylate Lvl <7.0 (*)    All other components within normal limits  ACETAMINOPHEN LEVEL - Abnormal; Notable for the following components:   Acetaminophen (Tylenol), Serum <10 (*)    All other components within normal limits  CBC - Abnormal; Notable for the following components:   WBC 3.6 (*)    All other components within normal limits  ETHANOL  RAPID URINE DRUG SCREEN, HOSP PERFORMED    EKG None  Radiology No results found.  Procedures Procedures    Medications Ordered in ED Medications  cloNIDine (CATAPRES) tablet 0.1 mg (has no administration in time range)    Followed by  cloNIDine (CATAPRES) tablet 0.1 mg (has no administration in time range)    Followed by  cloNIDine (CATAPRES) tablet 0.1 mg (has no administration in time range)  dicyclomine (BENTYL) tablet 20 mg (has no administration in time range)  hydrOXYzine (ATARAX) tablet 25 mg (has no administration in time range)  loperamide (IMODIUM) capsule 2-4 mg (has no administration in time range)  methocarbamol (ROBAXIN) tablet 500 mg (has no administration in time range)  naproxen (NAPROSYN) tablet 500 mg (has no administration in time range)  ondansetron (ZOFRAN-ODT) disintegrating tablet 4 mg (has no administration in time range)  LORazepam (ATIVAN) tablet 1 mg (1 mg Oral Given 04/15/23 1610)    ED Course/ Medical Decision Making/ A&P                             Medical Decision Making Amount and/or Complexity of Data Reviewed Labs: ordered. ECG/medicine tests: ordered and independent interpretation performed.  Risk Prescription drug management.   37 year old male  presenting to the ED with polysubstance abuse issues.  Recently relapsed and has been using meth, heroin, and fentanyl.  States he has gone to [facility] recently but states "they dont help you".  Reports some vague SI without active plan.  Denies HI/AVH.  Labs as above--no leukocytosis or significant electrolyte derangement.  Does have mild elevation of LFTs.  Negative Tylenol, salicylate, and ethanol levels.  UDS is pending.  Medically cleared.  TTS has evaluated-- recommends discharge home with OP resources.  Sounds like he has a bed  waiting for him at rehab center but has to be fully detoxed prior to starting treatment there.  He was given OP resources to try and set this up, made aware we do not offer IP detox in the ED.  Can return here for new concerns.  Final Clinical Impression(s) / ED Diagnoses Final diagnoses:  Polysubstance abuse [facility])    Rx / DC Orders ED Discharge Orders     None         Garlon Hatchet, PA-C 04/15/23 0519    Paula Libra, MD 04/15/23 [phone]

## 2023-04-15 NOTE — ED Notes (Signed)
  Patient upset about being discharged with outpatient resources.  Patient cursing and stating "nobody cares about addicts".  Patient given d/c papers and belongings.  Leaving on his own.  Security not needed.
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
## 2023-04-16 ENCOUNTER — Emergency Department (HOSPITAL_COMMUNITY): Payer: 59

## 2023-04-16 ENCOUNTER — Emergency Department (HOSPITAL_COMMUNITY)
Admission: EM | Admit: 2023-04-16 | Discharge: 2023-04-16 | Disposition: A | Discharge status: Home / Self Care | Payer: 59 | Attending: Emergency Medicine | Admitting: Emergency Medicine

## 2023-04-16 DIAGNOSIS — R7401 Elevation of levels of liver transaminase levels: Secondary | ICD-10-CM | POA: Insufficient documentation

## 2023-04-16 DIAGNOSIS — R000 Tachycardia, unspecified: Secondary | ICD-10-CM | POA: Insufficient documentation

## 2023-04-16 DIAGNOSIS — Z1152 Encounter for screening for COVID-19: Secondary | ICD-10-CM | POA: Insufficient documentation

## 2023-04-16 DIAGNOSIS — L03115 Cellulitis of right lower limb: Secondary | ICD-10-CM | POA: Insufficient documentation

## 2023-04-16 DIAGNOSIS — R21 Rash and other nonspecific skin eruption: Secondary | ICD-10-CM | POA: Diagnosis not present

## 2023-04-16 DIAGNOSIS — A419 Sepsis, unspecified organism: Secondary | ICD-10-CM | POA: Diagnosis not present

## 2023-04-16 LAB — URINALYSIS, W/ REFLEX TO CULTURE (INFECTION SUSPECTED)
Bacteria, UA: NONE SEEN
Bilirubin Urine: NEGATIVE
Glucose, UA: NEGATIVE mg/dL
Ketones, ur: NEGATIVE mg/dL
Leukocytes,Ua: NEGATIVE
Nitrite: NEGATIVE
Protein, ur: 30 mg/dL — AB
Specific Gravity, Urine: 1.024 (ref 1.005–1.030)
pH: 5 (ref 5.0–8.0)

## 2023-04-16 LAB — COMPREHENSIVE METABOLIC PANEL
ALT: 46 U/L — ABNORMAL HIGH (ref 0–44)
AST: 55 U/L — ABNORMAL HIGH (ref 15–41)
Albumin: 4.1 g/dL (ref 3.5–5.0)
Alkaline Phosphatase: 74 U/L (ref 38–126)
Anion gap: 7 (ref 5–15)
BUN: 16 mg/dL (ref 6–20)
CO2: 25 mmol/L (ref 22–32)
Calcium: 8.8 mg/dL — ABNORMAL LOW (ref 8.9–10.3)
Chloride: 102 mmol/L (ref 98–111)
Creatinine, Ser: 1.41 mg/dL — ABNORMAL HIGH (ref 0.61–1.24)
GFR, Estimated: >60 mL/min (ref >60)
Glucose, Bld: 100 mg/dL — ABNORMAL HIGH (ref 70–99)
Potassium: 3.7 mmol/L (ref 3.5–5.1)
Sodium: 134 mmol/L — ABNORMAL LOW (ref 135–145)
Total Bilirubin: 0.7 mg/dL (ref 0.3–1.2)
Total Protein: 7.6 g/dL (ref 6.5–8.1)

## 2023-04-16 LAB — CBC WITH DIFFERENTIAL/PLATELET
Abs Immature Granulocytes: 0.02 10*3/uL (ref 0.00–0.07)
Basophils Absolute: 0 10*3/uL (ref 0.0–0.1)
Basophils Relative: 0 %
Eosinophils Absolute: 0 10*3/uL (ref 0.0–0.5)
Eosinophils Relative: 0 %
HCT: 37.6 % — ABNORMAL LOW (ref 39.0–52.0)
Hemoglobin: 12.1 g/dL — ABNORMAL LOW (ref 13.0–17.0)
Immature Granulocytes: 0 %
Lymphocytes Relative: 16 %
Lymphs Abs: 1.4 10*3/uL (ref 0.7–4.0)
MCH: 29.5 pg (ref 26.0–34.0)
MCHC: 32.2 g/dL (ref 30.0–36.0)
MCV: 91.7 fL (ref 80.0–100.0)
Monocytes Absolute: 0.9 10*3/uL (ref 0.1–1.0)
Monocytes Relative: 10 %
Neutro Abs: 6.7 10*3/uL (ref 1.7–7.7)
Neutrophils Relative %: 74 %
Platelets: 201 10*3/uL (ref 150–400)
RBC: 4.1 MIL/uL — ABNORMAL LOW (ref 4.22–5.81)
RDW: 13.5 % (ref 11.5–15.5)
WBC: 9.1 10*3/uL (ref 4.0–10.5)
nRBC: 0 % (ref 0.0–0.2)

## 2023-04-16 LAB — RESP PANEL BY RT-PCR (RSV, FLU A&B, COVID)  RVPGX2
Influenza A by PCR: NEGATIVE
Influenza B by PCR: NEGATIVE
Resp Syncytial Virus by PCR: NEGATIVE
SARS Coronavirus 2 by RT PCR: NEGATIVE

## 2023-04-16 LAB — PROTIME-INR
INR: 1.1 (ref 0.8–1.2)
Prothrombin Time: 13.9 seconds (ref 11.4–15.2)

## 2023-04-16 LAB — APTT: aPTT: 28 seconds (ref 24–36)

## 2023-04-16 LAB — LACTIC ACID, PLASMA: Lactic Acid, Venous: 0.9 mmol/L (ref 0.5–1.9)

## 2023-04-16 LAB — HIV ANTIBODY (ROUTINE TESTING W REFLEX): HIV Screen 4th Generation wRfx: NONREACTIVE

## 2023-04-16 MED ORDER — VANCOMYCIN HCL IN DEXTROSE 1-5 GM/200ML-% IV SOLN
1000.0000 mg | Freq: Once | INTRAVENOUS | Status: AC
  Administered 2023-04-16: 1000 mg via INTRAVENOUS
  Filled 2023-04-16: qty 200

## 2023-04-16 MED ORDER — SODIUM CHLORIDE 0.9 % IV SOLN
2.0000 g | INTRAVENOUS | Status: DC
  Filled 2023-04-16: qty 20

## 2023-04-16 MED ORDER — DOXYCYCLINE HYCLATE 100 MG PO CAPS: 100.0000 mg | ORAL_CAPSULE | Freq: Two times a day (BID) | ORAL | 0 refills | Status: AC

## 2023-04-16 MED ORDER — LACTATED RINGERS IV BOLUS (SEPSIS): 500.0000 mL | Freq: Once | INTRAVENOUS | Status: DC

## 2023-04-16 MED ORDER — ACETAMINOPHEN 500 MG PO TABS
1000.0000 mg | ORAL_TABLET | Freq: Once | ORAL | Status: AC
  Administered 2023-04-16: 1000 mg via ORAL
  Filled 2023-04-16: qty 2

## 2023-04-16 MED ORDER — CEPHALEXIN 500 MG PO CAPS: 500.0000 mg | ORAL_CAPSULE | Freq: Four times a day (QID) | ORAL | 0 refills | Status: DC

## 2023-04-16 MED ORDER — SODIUM CHLORIDE 0.9 % IV SOLN
2.0000 g | Freq: Once | INTRAVENOUS | Status: AC
  Administered 2023-04-16: 2 g via INTRAVENOUS
  Filled 2023-04-16: qty 12.5

## 2023-04-16 MED ORDER — LACTATED RINGERS IV BOLUS (SEPSIS)
1000.0000 mL | Freq: Once | INTRAVENOUS | Status: AC
  Administered 2023-04-16: 1000 mL via INTRAVENOUS

## 2023-04-16 MED ORDER — LACTATED RINGERS IV SOLN: INTRAVENOUS | Status: DC

## 2023-04-16 NOTE — Discharge Instructions (Signed)
You have been diagnosed with skin infection, cellulitis.  Please take antibiotics as prescribed for the full duration.  Keep your leg elevated at night to decrease swelling.  Return to ER if your symptoms worsen or if you have other concern.  You may take over-the-counter Tylenol as needed for fever.  Please use resource below to seek help with substance abuse.

## 2023-04-16 NOTE — Sepsis Progress Note (Signed)
eLink is following this Code Sepsis. °

## 2023-04-16 NOTE — ED Notes (Signed)
Unable to get blood work x2 attempt. Lav sent down

## 2023-04-16 NOTE — ED Triage Notes (Signed)
Pt reports rash to bilateral ankle and bruises up legs. Rash started a few weeks ago but got way worse since yesterday and painful. Right worse than left. Last used around 0500, meth and heroin.

## 2023-04-16 NOTE — ED Provider Notes (Signed)
[place] EMERGENCY DEPARTMENT AT [facility] Provider Note   CSN: [id number] Arrival date & time: 04/16/23  1410     History  Chief Complaint  Patient presents with   Rash   Leg Pain    Brian [name] is a 37 y.o. male.  The history is provided by the patient and medical records. No language interpreter was used.  Rash Leg Pain    37 year old male with significant history of polysubstance abuse, homelessness, bipolar, presenting complaining of a rash.  Patient reported he recently resume IV drug use for the past 2 weeks and for the past several days he endorsed having a painful rash that initially started out in his right ankle and leg and now spreading towards his left ankle and to his body.  Rash is throbbing achy moderate to severe intensity and not itchy.  He endorsed having fever, body aches, and overall not feeling well.  His last meth use was earlier today as well as heroin use.  Has history of hep C.  Denies any severe headache, nausea vomit diarrhea dysuria chest pain shortness of breath or productive cough.  Denies any history of HIV.  Home Medications Prior to Admission medications   Medication Sig Start Date End Date Taking? Authorizing Provider  buPROPion (WELLBUTRIN XL) 300 MG 24 hr tablet Take 300 mg by mouth daily.    [provider]  gabapentin (NEURONTIN) 300 MG capsule Take 1 capsule (300 mg total) by mouth 3 (three) times daily. 08/13/22   Carlyn Reichert, MD  hydrOXYzine (ATARAX) 25 MG tablet Take 1 tablet (25 mg total) by mouth 3 (three) times daily as needed for anxiety. 08/13/22   Carlyn Reichert, MD  isosorbide dinitrate (ISORDIL) 30 MG tablet Take 15 mg by mouth daily.    [provider]  LIPITOR 80 MG tablet Take 1 tablet (80 mg total) by mouth at bedtime. 08/13/22   Carlyn Reichert, MD  QUEtiapine (SEROQUEL) 200 MG tablet Take 1 tablet (200 mg total) by mouth at bedtime. 08/13/22   Carlyn Reichert, MD  FLUoxetine  (PROZAC) 20 MG capsule Take 1 capsule (20 mg total) by mouth daily. For depression Patient not taking: Reported on 11/04/2019 04/01/18 05/04/20  Armandina Stammer I, NP  sucralfate (CARAFATE) 1 g tablet Take 1 tablet (1 g total) by mouth 4 (four) times daily -  with meals and at bedtime. Patient not taking: Reported on 11/04/2019 08/30/19 05/04/20  Anselm Pancoast, PA-C      Allergies    Fish allergy and Shellfish allergy    Review of Systems   Review of Systems  Skin:  Positive for rash.  All other systems reviewed and are negative.   Physical Exam Updated Vital Signs BP 133/81 (BP Location: Left Arm)   Pulse (!) 106   Temp 100 F (37.8 C) (Oral)   Resp 18   Ht 5\' 8"  (1.727 m)   Wt 77.1 kg   SpO2 99%   BMI 25.85 kg/m  Physical Exam Vitals and nursing note reviewed.  Constitutional:      General: He is not in acute distress.    Appearance: He is well-developed.     Comments: Appears uncomfortable but nontoxic  HENT:     Head: Atraumatic.  Eyes:     Extraocular Movements: Extraocular movements intact.     Conjunctiva/sclera: Conjunctivae normal.     Pupils: Pupils are equal, round, and reactive to light.  Neck:     Comments: No nuchal  rigidity Cardiovascular:     Rate and Rhythm: Tachycardia present.     Pulses: Normal pulses.     Heart sounds: Normal heart sounds. No murmur heard. Pulmonary:     Effort: Pulmonary effort is normal.     Breath sounds: Normal breath sounds. No wheezing, rhonchi or rales.  Abdominal:     Palpations: Abdomen is soft.     Tenderness: There is no abdominal tenderness.  Musculoskeletal:     Cervical back: Normal range of motion and neck supple. No rigidity or tenderness.  Skin:    Findings: Rash (An area of blanchable area seem edema and warmth noted to right ankle with tenderness towards the lower legs with intact distal pulses.) present.     Comments: Patchy erythema noted to left lower extremity that is blanchable.  Neurological:     Mental Status:  He is alert and oriented to person, place, and time.  Psychiatric:        Mood and Affect: Mood normal.     ED Results / Procedures / Treatments   Labs (all labs ordered are listed, but only abnormal results are displayed) Labs Reviewed  COMPREHENSIVE METABOLIC PANEL - Abnormal; Notable for the following components:      Result Value   Sodium 134 (*)    Glucose, Bld 100 (*)    Creatinine, Ser 1.41 (*)    Calcium 8.8 (*)    AST 55 (*)    ALT 46 (*)    All other components within normal limits  CBC WITH DIFFERENTIAL/PLATELET - Abnormal; Notable for the following components:   RBC 4.10 (*)    Hemoglobin 12.1 (*)    HCT 37.6 (*)    All other components within normal limits  URINALYSIS, W/ REFLEX TO CULTURE (INFECTION SUSPECTED) - Abnormal; Notable for the following components:   Hgb urine dipstick SMALL (*)    Protein, ur 30 (*)    All other components within normal limits  RESP PANEL BY RT-PCR (RSV, FLU A&B, COVID)  RVPGX2  CULTURE, BLOOD (ROUTINE X 2)  CULTURE, BLOOD (ROUTINE X 2)  LACTIC ACID, PLASMA  PROTIME-INR  APTT  CBC WITH DIFFERENTIAL/PLATELET  HIV ANTIBODY (ROUTINE TESTING W REFLEX)  RPR    EKG None  Radiology DG Chest Port 1 View  Result Date: 04/16/2023 CLINICAL DATA:  Sepsis EXAM: PORTABLE CHEST 1 VIEW COMPARISON:  01/06/2023 x-ray and older FINDINGS: No consolidation, pneumothorax or effusion. No edema. Normal cardiopericardial silhouette. IMPRESSION: No acute cardiopulmonary disease. Electronically Signed   By: Karen Kays M.D.   On: 04/16/2023 15:57    Procedures Procedures    Medications Ordered in ED Medications  lactated ringers bolus 1,000 mL (1,000 mLs Intravenous New Bag/Given 04/16/23 1557)    And  lactated ringers bolus 1,000 mL (1,000 mLs Intravenous New Bag/Given 04/16/23 1606)  acetaminophen (TYLENOL) tablet 1,000 mg (1,000 mg Oral Given 04/16/23 1600)  vancomycin (VANCOCIN) IVPB 1000 mg/200 mL premix (1,000 mg Intravenous New Bag/Given  04/16/23 1733)  ceFEPIme (MAXIPIME) 2 g in sodium chloride 0.9 % 100 mL IVPB (0 g Intravenous Stopped 04/16/23 1623)    ED Course/ Medical Decision Making/ A&P                             Medical Decision Making Amount and/or Complexity of Data Reviewed Labs: ordered. Radiology: ordered. ECG/medicine tests: ordered.  Risk OTC drugs. Prescription drug management.   BP 133/81 (BP Location: Left Arm)  Pulse (!) 106   Temp 100 F (37.8 C) (Oral)   Resp 18   Ht 5\' 8"  (1.727 m)   Wt 77.1 kg   SpO2 99%   BMI 25.85 kg/m   43:57 PM   37 year old male with significant history of polysubstance abuse, homelessness, bipolar, presenting complaining of a rash.  Patient reported he recently resume IV drug use for the past 2 weeks and for the past several days he endorsed having a painful rash that initially started out in his right ankle and leg and now spreading towards his left ankle and to his body.  Rash is throbbing achy moderate to severe intensity and not itchy.  He endorsed having fever, body aches, and overall not feeling well.  His last meth use was earlier today as well as heroin use.  Has history of hep C.  Denies any severe headache, nausea vomit diarrhea dysuria chest pain shortness of breath or productive cough.  Denies any history of HIV.  On exam, patient appears uncomfortable but nontoxic.  He does not have any nuchal rigidity.  He is warm to the touch.  Heart with tachycardia with without any murmur rubs or gallops, lungs are clear to auscultation bilaterally abdomen is soft nontender.  Patient has erythematous rash noted to bilateral lower leg with area of blanchable redness but also patchy area of nonblanchable redness more noticeable to his right ankle.  He is able to range his ankle.  He also has similar blanchable rash noted to his left lower extremity but less intense.  He is neurovascular intact.  Vital signs notable for an oral temperature of 100, tachycardic with heart  rate 106, his current blood pressure is 133/81.  He is not hypoxic.  Given history of IV drug use, and having painful rash, I am concerned for cellulitis versus vasculitis.  Code sepsis initiated.  -Labs ordered, independently viewed and interpreted by me.  Labs remarkable for Cr. 1.41, IVF given.  Normal lactic acid and normal WBC therefore pt does not require fluid resuscitation at 18ml/kg.  Mild transaminitis likely 2/2 to Hx of HepC. -The patient was maintained on a cardiac monitor.  I personally viewed and interpreted the cardiac monitored which showed an underlying rhythm of: sinus tachycardia -Imaging independently viewed and interpreted by me and I agree with radiologist's interpretation.  Result remarkable for CXR without concerning finding -This patient presents to the ED for concern of rash, this involves an extensive number of treatment options, and is a complaint that carries with it a high risk of complications and morbidity.  The differential diagnosis includes cellulitis, myositis, osteomyelitis, abscess, allergic dermatitis, syphilitic rash, meningococcal rash, HIV rash -Co morbidities that complicate the patient evaluation includes IVDU -Treatment includes IV abx, tylenol -Reevaluation of the patient after these medicines showed that the patient improved -PCP office notes or outside notes reviewed -Escalation to admission/observation considered: patients feels much better, is comfortable with discharge, and will follow up with PCP -Prescription medication considered, patient comfortable with doxycycline -Social Determinant of Health considered which includes IVDU, food insecurity, depression  6:48 PM  Patient received antibiotic and tolerated well.  Labs obtained and overall reassuring.  Some mild AKI improved with IV fluids.  Normal WBC, normal lactic acid, vital signs overall reassuring.  At this time, I felt patient can be discharged home with doxycycline as treatment for his  cellulitis.  Gave patient return precaution.  Encourage patient to avoid drug use but at this time he is stable to  be discharged        Final Clinical Impression(s) / ED Diagnoses Final diagnoses:  Cellulitis of right leg    Rx / DC Orders ED Discharge Orders          Ordered    doxycycline (VIBRAMYCIN) 100 MG capsule  2 times daily        04/16/23 1851    cephALEXin (KEFLEX) 500 MG capsule  4 times daily        04/16/23 1851              Fayrene Helper, PA-C 04/16/23 1853    Linwood Dibbles, MD 04/17/23 [id number]

## 2023-04-16 NOTE — ED Notes (Signed)
Pt provided discharge instructions and prescription information. Pt was given the opportunity to ask questions and questions were answered.   

## 2023-04-17 ENCOUNTER — Other Ambulatory Visit: Payer: Self-pay

## 2023-04-17 ENCOUNTER — Ambulatory Visit (HOSPITAL_COMMUNITY)
Admission: EM | Admit: 2023-04-17 | Discharge: 2023-04-17 | Disposition: A | Discharge status: Other Acute Inpatient Hospital | Payer: 59 | Attending: Behavioral Health | Admitting: Behavioral Health

## 2023-04-17 ENCOUNTER — Inpatient Hospital Stay (HOSPITAL_COMMUNITY)
Admission: AD | Admit: 2023-04-17 | Discharge: 2023-04-23 | DRG: 885 | Disposition: A | Discharge status: Home / Self Care | Payer: 59 | Source: Intra-hospital | Attending: Psychiatry | Admitting: Psychiatry

## 2023-04-17 ENCOUNTER — Encounter (HOSPITAL_COMMUNITY): Payer: Self-pay | Admitting: Behavioral Health

## 2023-04-17 DIAGNOSIS — R45851 Suicidal ideations: Secondary | ICD-10-CM

## 2023-04-17 DIAGNOSIS — F1520 Other stimulant dependence, uncomplicated: Secondary | ICD-10-CM

## 2023-04-17 DIAGNOSIS — Z5941 Food insecurity: Secondary | ICD-10-CM | POA: Diagnosis not present

## 2023-04-17 DIAGNOSIS — F314 Bipolar disorder, current episode depressed, severe, without psychotic features: Principal | ICD-10-CM | POA: Diagnosis present

## 2023-04-17 DIAGNOSIS — I252 Old myocardial infarction: Secondary | ICD-10-CM

## 2023-04-17 DIAGNOSIS — F1110 Opioid abuse, uncomplicated: Secondary | ICD-10-CM | POA: Diagnosis not present

## 2023-04-17 DIAGNOSIS — F411 Generalized anxiety disorder: Secondary | ICD-10-CM | POA: Diagnosis not present

## 2023-04-17 DIAGNOSIS — F1190 Opioid use, unspecified, uncomplicated: Secondary | ICD-10-CM | POA: Diagnosis present

## 2023-04-17 DIAGNOSIS — Z5900 Homelessness unspecified: Secondary | ICD-10-CM

## 2023-04-17 DIAGNOSIS — F1990 Other psychoactive substance use, unspecified, uncomplicated: Secondary | ICD-10-CM | POA: Diagnosis not present

## 2023-04-17 DIAGNOSIS — F1510 Other stimulant abuse, uncomplicated: Secondary | ICD-10-CM | POA: Diagnosis present

## 2023-04-17 DIAGNOSIS — Z91199 Patient's noncompliance with other medical treatment and regimen due to unspecified reason: Secondary | ICD-10-CM

## 2023-04-17 DIAGNOSIS — Z818 Family history of other mental and behavioral disorders: Secondary | ICD-10-CM

## 2023-04-17 DIAGNOSIS — Z79899 Other long term (current) drug therapy: Secondary | ICD-10-CM | POA: Diagnosis not present

## 2023-04-17 DIAGNOSIS — Z91013 Allergy to seafood: Secondary | ICD-10-CM

## 2023-04-17 DIAGNOSIS — Z8249 Family history of ischemic heart disease and other diseases of the circulatory system: Secondary | ICD-10-CM | POA: Diagnosis not present

## 2023-04-17 DIAGNOSIS — Z8711 Personal history of peptic ulcer disease: Secondary | ICD-10-CM

## 2023-04-17 DIAGNOSIS — F3181 Bipolar II disorder: Secondary | ICD-10-CM | POA: Diagnosis not present

## 2023-04-17 DIAGNOSIS — Z91411 Personal history of adult psychological abuse: Secondary | ICD-10-CM | POA: Diagnosis not present

## 2023-04-17 DIAGNOSIS — Z560 Unemployment, unspecified: Secondary | ICD-10-CM

## 2023-04-17 DIAGNOSIS — Z87442 Personal history of urinary calculi: Secondary | ICD-10-CM | POA: Diagnosis not present

## 2023-04-17 DIAGNOSIS — F17210 Nicotine dependence, cigarettes, uncomplicated: Secondary | ICD-10-CM | POA: Diagnosis present

## 2023-04-17 DIAGNOSIS — Z91410 Personal history of adult physical and sexual abuse: Secondary | ICD-10-CM

## 2023-04-17 DIAGNOSIS — E7800 Pure hypercholesterolemia, unspecified: Secondary | ICD-10-CM | POA: Diagnosis not present

## 2023-04-17 DIAGNOSIS — F4310 Post-traumatic stress disorder, unspecified: Secondary | ICD-10-CM | POA: Diagnosis present

## 2023-04-17 DIAGNOSIS — F1193 Opioid use, unspecified with withdrawal: Secondary | ICD-10-CM | POA: Diagnosis present

## 2023-04-17 DIAGNOSIS — Z9151 Personal history of suicidal behavior: Secondary | ICD-10-CM

## 2023-04-17 LAB — POCT URINE DRUG SCREEN - MANUAL ENTRY (I-SCREEN)
POC Amphetamine UR: POSITIVE — AB
POC Buprenorphine (BUP): POSITIVE — AB
POC Cocaine UR: POSITIVE — AB
POC Marijuana UR: POSITIVE — AB
POC Methadone UR: NOT DETECTED
POC Methamphetamine UR: POSITIVE — AB
POC Morphine: POSITIVE — AB
POC Oxazepam (BZO): NOT DETECTED
POC Oxycodone UR: NOT DETECTED
POC Secobarbital (BAR): NOT DETECTED

## 2023-04-17 LAB — RPR: RPR Ser Ql: NONREACTIVE

## 2023-04-17 MED ORDER — NAPROXEN 500 MG PO TABS: 500.0000 mg | ORAL_TABLET | Freq: Two times a day (BID) | ORAL | Status: DC | PRN

## 2023-04-17 MED ORDER — CEPHALEXIN 250 MG PO CAPS
500.0000 mg | ORAL_CAPSULE | Freq: Four times a day (QID) | ORAL | Status: DC
  Administered 2023-04-17: 250 mg via ORAL
  Administered 2023-04-17: 500 mg via ORAL
  Filled 2023-04-17 (×2): qty 2

## 2023-04-17 MED ORDER — TRAZODONE HCL 50 MG PO TABS
50.0000 mg | ORAL_TABLET | Freq: Every evening | ORAL | Status: DC | PRN
  Administered 2023-04-17 – 2023-04-20 (×4): 50 mg via ORAL
  Filled 2023-04-17 (×4): qty 1

## 2023-04-17 MED ORDER — ALUM & MAG HYDROXIDE-SIMETH 200-200-20 MG/5ML PO SUSP: 30.0000 mL | ORAL | Status: DC | PRN

## 2023-04-17 MED ORDER — CLONIDINE HCL 0.1 MG PO TABS
0.1000 mg | ORAL_TABLET | Freq: Every day | ORAL | Status: AC
  Administered 2023-04-22 – 2023-04-23 (×2): 0.1 mg via ORAL
  Filled 2023-04-17 (×2): qty 1

## 2023-04-17 MED ORDER — DOXYCYCLINE HYCLATE 100 MG PO TABS
100.0000 mg | ORAL_TABLET | Freq: Two times a day (BID) | ORAL | Status: DC
  Administered 2023-04-17: 100 mg via ORAL
  Filled 2023-04-17: qty 1

## 2023-04-17 MED ORDER — CLONIDINE HCL 0.1 MG PO TABS: 0.1000 mg | ORAL_TABLET | ORAL | Status: DC

## 2023-04-17 MED ORDER — ACETAMINOPHEN 325 MG PO TABS: 650.0000 mg | ORAL_TABLET | Freq: Four times a day (QID) | ORAL | Status: DC | PRN

## 2023-04-17 MED ORDER — DICYCLOMINE HCL 20 MG PO TABS: 20.0000 mg | ORAL_TABLET | Freq: Four times a day (QID) | ORAL | Status: DC | PRN

## 2023-04-17 MED ORDER — METHOCARBAMOL 500 MG PO TABS
500.0000 mg | ORAL_TABLET | Freq: Three times a day (TID) | ORAL | Status: DC | PRN
  Administered 2023-04-17 – 2023-04-19 (×4): 500 mg via ORAL
  Filled 2023-04-17 (×4): qty 1

## 2023-04-17 MED ORDER — CEPHALEXIN 500 MG PO CAPS
500.0000 mg | ORAL_CAPSULE | Freq: Four times a day (QID) | ORAL | Status: AC
  Administered 2023-04-17 – 2023-04-22 (×18): 500 mg via ORAL
  Filled 2023-04-17 (×20): qty 1

## 2023-04-17 MED ORDER — ONDANSETRON 4 MG PO TBDP: 4.0000 mg | ORAL_TABLET | Freq: Four times a day (QID) | ORAL | Status: DC | PRN

## 2023-04-17 MED ORDER — CLONIDINE HCL 0.1 MG PO TABS
0.1000 mg | ORAL_TABLET | ORAL | Status: AC
  Administered 2023-04-19 – 2023-04-21 (×4): 0.1 mg via ORAL
  Filled 2023-04-17 (×4): qty 1

## 2023-04-17 MED ORDER — DOXYCYCLINE HYCLATE 100 MG PO TABS
100.0000 mg | ORAL_TABLET | Freq: Two times a day (BID) | ORAL | Status: DC
  Administered 2023-04-17 – 2023-04-23 (×12): 100 mg via ORAL
  Filled 2023-04-17 (×15): qty 1

## 2023-04-17 MED ORDER — NAPROXEN 500 MG PO TABS
500.0000 mg | ORAL_TABLET | Freq: Two times a day (BID) | ORAL | Status: DC | PRN
  Administered 2023-04-17 – 2023-04-19 (×2): 500 mg via ORAL
  Filled 2023-04-17 (×3): qty 1

## 2023-04-17 MED ORDER — MAGNESIUM HYDROXIDE 400 MG/5ML PO SUSP: 30.0000 mL | Freq: Every day | ORAL | Status: DC | PRN

## 2023-04-17 MED ORDER — DIPHENHYDRAMINE HCL 50 MG/ML IJ SOLN: 50.0000 mg | Freq: Three times a day (TID) | INTRAMUSCULAR | Status: DC | PRN

## 2023-04-17 MED ORDER — HYDROXYZINE HCL 25 MG PO TABS
25.0000 mg | ORAL_TABLET | Freq: Three times a day (TID) | ORAL | Status: DC | PRN
  Administered 2023-04-17 – 2023-04-19 (×4): 25 mg via ORAL
  Filled 2023-04-17: qty 20
  Filled 2023-04-17 (×4): qty 1

## 2023-04-17 MED ORDER — HALOPERIDOL LACTATE 5 MG/ML IJ SOLN: 5.0000 mg | Freq: Three times a day (TID) | INTRAMUSCULAR | Status: DC | PRN

## 2023-04-17 MED ORDER — CLONIDINE HCL 0.1 MG PO TABS
0.1000 mg | ORAL_TABLET | Freq: Four times a day (QID) | ORAL | Status: AC
  Administered 2023-04-17 – 2023-04-19 (×7): 0.1 mg via ORAL
  Filled 2023-04-17 (×8): qty 1

## 2023-04-17 MED ORDER — DOXYCYCLINE HYCLATE 100 MG PO TABS: 100.0000 mg | ORAL_TABLET | Freq: Two times a day (BID) | ORAL | Status: DC

## 2023-04-17 MED ORDER — HALOPERIDOL 5 MG PO TABS: 5.0000 mg | ORAL_TABLET | Freq: Three times a day (TID) | ORAL | Status: DC | PRN

## 2023-04-17 MED ORDER — ONDANSETRON 4 MG PO TBDP
4.0000 mg | ORAL_TABLET | Freq: Four times a day (QID) | ORAL | Status: AC | PRN
  Administered 2023-04-17: 4 mg via ORAL
  Filled 2023-04-17 (×3): qty 1

## 2023-04-17 MED ORDER — HYDROXYZINE HCL 25 MG PO TABS: 25.0000 mg | ORAL_TABLET | Freq: Three times a day (TID) | ORAL | Status: DC | PRN

## 2023-04-17 MED ORDER — CLONIDINE HCL 0.1 MG PO TABS: 0.1000 mg | ORAL_TABLET | Freq: Every day | ORAL | Status: DC

## 2023-04-17 MED ORDER — LORAZEPAM 2 MG/ML IJ SOLN: 2.0000 mg | Freq: Three times a day (TID) | INTRAMUSCULAR | Status: DC | PRN

## 2023-04-17 MED ORDER — QUETIAPINE FUMARATE 100 MG PO TABS
100.0000 mg | ORAL_TABLET | Freq: Every day | ORAL | Status: DC
  Administered 2023-04-17: 100 mg via ORAL
  Filled 2023-04-17 (×3): qty 1

## 2023-04-17 MED ORDER — LOPERAMIDE HCL 2 MG PO CAPS: 2.0000 mg | ORAL_CAPSULE | ORAL | Status: DC | PRN

## 2023-04-17 MED ORDER — TRAZODONE HCL 50 MG PO TABS: 50.0000 mg | ORAL_TABLET | Freq: Every evening | ORAL | Status: DC | PRN

## 2023-04-17 MED ORDER — CLONIDINE HCL 0.1 MG PO TABS
0.1000 mg | ORAL_TABLET | Freq: Four times a day (QID) | ORAL | Status: DC
  Administered 2023-04-17: 0.1 mg via ORAL
  Filled 2023-04-17 (×2): qty 1

## 2023-04-17 MED ORDER — DIPHENHYDRAMINE HCL 25 MG PO CAPS: 50.0000 mg | ORAL_CAPSULE | Freq: Three times a day (TID) | ORAL | Status: DC | PRN

## 2023-04-17 MED ORDER — LOPERAMIDE HCL 2 MG PO CAPS: 2.0000 mg | ORAL_CAPSULE | ORAL | Status: AC | PRN

## 2023-04-17 MED ORDER — LORAZEPAM 1 MG PO TABS: 2.0000 mg | ORAL_TABLET | Freq: Three times a day (TID) | ORAL | Status: DC | PRN

## 2023-04-17 MED ORDER — METHOCARBAMOL 500 MG PO TABS: 500.0000 mg | ORAL_TABLET | Freq: Three times a day (TID) | ORAL | Status: DC | PRN

## 2023-04-17 MED ORDER — DICYCLOMINE HCL 20 MG PO TABS
20.0000 mg | ORAL_TABLET | Freq: Four times a day (QID) | ORAL | Status: AC | PRN
  Administered 2023-04-17 – 2023-04-19 (×3): 20 mg via ORAL
  Filled 2023-04-17 (×3): qty 1

## 2023-04-17 MED ORDER — QUETIAPINE FUMARATE 100 MG PO TABS: 100.0000 mg | ORAL_TABLET | Freq: Every day | ORAL | Status: DC

## 2023-04-17 NOTE — Discharge Instructions (Signed)
Transfer to [facility] 

## 2023-04-17 NOTE — Progress Notes (Signed)
   04/17/23 2200  Psych Admission Type (Psych Patients Only)  Admission Status Voluntary  Psychosocial Assessment  Patient Complaints Substance abuse  Eye Contact Fair  Facial Expression Anxious  Affect Anxious  Speech Logical/coherent  Interaction Assertive  Motor Activity Fidgety  Appearance/Hygiene Unremarkable  Behavior Characteristics Anxious;Hyperactive;Restless  Mood Anxious  Aggressive Behavior  Effect No apparent injury  Thought Process  Coherency WDL  Content WDL  Delusions None reported or observed  Perception WDL  Hallucination None reported or observed  Judgment WDL  Confusion WDL  Danger to Self  Current suicidal ideation? Passive  Description of Suicide Plan OD  Self-Injurious Behavior Some self-injurious ideation observed or expressed.  No lethal plan expressed   Agreement Not to Harm Self Yes  Description of Agreement Verbal contract for safety  Danger to Others  Danger to Others None reported or observed

## 2023-04-17 NOTE — [facility] Group Notes (Signed)
Adult Psychoeducational Group Note  Date:  04/17/2023 Time:  9:21 PM  Group Topic/Focus:  Wrap-Up Group:   The focus of this group is to help patients review their daily goal of treatment and discuss progress on daily workbooks.  Participation Level:  Active  Participation Quality:  Appropriate  Affect:  Appropriate  Cognitive:  Appropriate  Insight: Appropriate  Engagement in Group:  Engaged  Modes of Intervention:  Discussion  Additional Comments:  Pt attended the evening AA group.  Christ Kick 04/17/2023, 9:21 PM

## 2023-04-17 NOTE — ED Notes (Signed)
Safe transport service called to transport pt to [facility]. Pt made aware and agree to transport. Safety maintained.

## 2023-04-17 NOTE — Progress Notes (Signed)
   04/17/23 0736  [facility] Triage Screening (Walk-ins at [facility] only)  What Is the Reason for Your Visit/Call Today? Brian [name] is a 37 year old male presenting to [facility] with complaint of drug use and suicidal ideations. Patient reports he relapsed a few weeks ago and now he is "struggling, feeling defeated, dont know what to do so I decided to come [address] Patient reports no one else is trying to help and since the relapse he has been having SI with plan to use Fentanyl to harm himself. Patient reports he was hiding his drug use from people but they soon found out and then he was kicked out the [place] house, can't go back to work until he is clean and now living on the streets. Patient states "I don't want to do that street thing anymore". Patient reports he used half gram of Ice this morning and 10 bag of fentanyl last night. Patient reports he was clean for 7 months prior to relapsing. Patient denies HI, AVH. Patient also has some swelling and redness to his ankle.  How Long Has This Been Causing You Problems? > than 6 months  Have You Recently Had Any Thoughts About Hurting Yourself? Yes  How long ago did you have thoughts about hurting yourself? having for two and half weeks  Are You Planning to Commit Suicide/Harm Yourself At This time? Yes  Have you Recently Had Thoughts About Hurting Someone [name]? No  Are You Planning To Harm Someone At This Time? No  Explanation: na  Are you currently experiencing any auditory, visual or other hallucinations? No  Have You Used Any Alcohol or Drugs in the Past 24 Hours? Yes  Do you have any current medical co-morbidities that require immediate attention? Yes  Please describe current medical co-morbidities that require immediate attention: ankle pain and swelling  Clinician description of patient physical appearance/behavior: agitated  What Do You Feel Would Help You the Most Today? Alcohol or Drug Use Treatment;Treatment for Depression or other mood problem  If  access to [facility] was not available, would you have sought care in the Emergency Department? No  Determination of Need Urgent (48 hours)  Options For Referral Medication Management;Outpatient Therapy

## 2023-04-17 NOTE — ED Notes (Addendum)
Pt admitted to observation unit d/t endorsing SI w/plan to OD on Fentanyl. Pt reports last use of Fentanyl was last night. Pt states, "it's all because of a girl that I started feeling that way (laughing)". Pt request detox and residential tx. Pt states, "I also have to get my mental health under control". Calm, cooperative throughout interview process. Skin assessment completed. Observed red edematous areas to bil ankles. Provider aware. ABT's ordered. Oriented to unit. Meal and drink offered. Pt verbally contract for safety. Will monitor for safety.

## 2023-04-17 NOTE — BH Assessment (Signed)
Comprehensive Clinical Assessment (CCA) Screening, Triage and Referral Note  04/17/2023 COHAN STIPES [id number]  Disposition: Per Liborio Nixon, NP, patient is recommended for inpatient treatment.   The patient demonstrates the following risk factors for suicide: Chronic risk factors for suicide include: substance use disorder. Acute risk factors for suicide include: unemployment and loss (financial, interpersonal, professional). Protective factors for this patient include: hope for the future. Considering these factors, the overall suicide risk at this point appears to be high. Patient is not appropriate for outpatient follow up.  Chief Complaint:  Chief Complaint  Patient presents with   Mental Health Evaluation   Addiction Problem   Suicidal   Visit Diagnosis: Polysubstance abuse      Suicidal Ideation  Patient Reported Information How did you hear about [place]? Self  What Is the Reason for Your Visit/Call Today? Shadoe is a 37 year old male presenting to [facility] with complaint of drug use and suicidal ideations. Patient reports he relapsed a few weeks ago and now he is "struggling, feeling defeated, dont know what to do so I decided to come [address]". Patient reports no one else is trying to help and since the relapse he has been having SI with plan to use Fentanyl to harm himself. Patient reports he was hiding his drug use from people but they soon found out and then he was kicked out the [place] house, can't go back to work until he is clean and now living on the streets. Patient states "I don't want to do that street thing anymore". Patient reports he used half gram of Ice this morning and 10 bag of fentanyl last night. Patient reports he was clean for 7 months prior to relapsing. Patient denies HI, AVH. Patient also has some swelling and redness to his ankle.  Patient is oriented x4, engaged, alert and cooperative during assessment. Patient eye contact and speech is normal, affect  is euthymic with congruent mood. Patient reports SI with plan for the past few weeks. Patient denies HI, AVH.    How Long Has This Been Causing You Problems? > than 6 months  What Do You Feel Would Help You the Most Today? Alcohol or Drug Use Treatment; Treatment for Depression or other mood problem   Have You Recently Had Any Thoughts About Hurting Yourself? Yes  Are You Planning to Commit Suicide/Harm Yourself At This time? Yes   Have you Recently Had Thoughts About Hurting Someone [name]? No  Are You Planning to Harm Someone at This Time? No  Explanation: na   Have You Used Any Alcohol or Drugs in the Past 24 Hours? Yes  How Long Ago Did You Use Drugs or Alcohol? No data recorded What Did You Use and How Much? Ice and Fentanyl   Do You Currently Have a Therapist/Psychiatrist? No  Name of Therapist/Psychiatrist: NA   Have You Been Recently Discharged From Any Office Practice or Programs? Yes  Explanation of Discharge From Practice/Program: [facility]    CCA Screening Triage Referral Assessment Type of Contact: Face-to-Face  Telemedicine Service Delivery:   Is this Initial or Reassessment? Is this Initial or Reassessment?: Reassessment  Date Telepsych consult ordered in [facility]:    Time Telepsych consult ordered in [facility]:    Location of Assessment: [facility] Assessment Services  Provider Location: [facility] Assessment Services    Collateral Involvement: NA   Does Patient Have a Automotive engineer Guardian? No data recorded Name and Contact of Legal Guardian: No data  recorded If Minor and Not Living with Parent(s), Who has Custody? NA  Is CPS involved or ever been involved? Never  Is APS involved or ever been involved? Never   Patient Determined To Be At Risk for Harm To Self or Others Based on Review of Patient Reported Information or Presenting Complaint? Yes, for Self-Harm  Method: No Plan  Availability of Means: No access or NA  Intent: Vague intent or  NA  Notification Required: No need or identified person  Additional Information for Danger to Others Potential: Family history of violence; Previous attempts  Additional Comments for Danger to Others Potential: NA  Are There Guns or Other Weapons in Your Home? No  Types of Guns/Weapons: NA  Are These Weapons Safely Secured?                            -- (NA)  Who Could Verify You Are Able To Have These Secured: NA  Do You Have any Outstanding Charges, Pending Court Dates, Parole/Probation? DENIES  Contacted To Inform of Risk of Harm To Self or Others: -- (NA)   Does Patient Present under Involuntary Commitment? No    [place] of Residence: [place]   Patient Currently Receiving the Following Services: Not Receiving Services   Determination of Need: Urgent (48 hours)   Options For Referral: Medication Management; Outpatient Therapy   Discharge Disposition:     Audree Camel, [facility]

## 2023-04-17 NOTE — Progress Notes (Signed)
Pt admitted to [facility] today. Pt reports struggling with substance use and depression. Pt reports suicidal thoughts with a plan to overdose for the past 2 months. Pt states one trigger is infidelity of partner, noting " I found out I was the sidepiece." Pt states he has been using Fentanyl, Meth, and Benzodiazepines. Pt was living at a recovery house however was kicked out after he had relapsed. Pt has developed cellulitis on both ankles from IV drug abuse. Pt states last use was last night. Pt endorses withdrawal symptoms including muscle aches, runny nose, stomach cramps, and nausea. Q 15 minute checks initiated.

## 2023-04-17 NOTE — ED Provider Notes (Cosign Needed Addendum)
Behavioral Health Urgent Care Medical Screening Exam  Date: 04/17/23 Patient Name: Brian [name] MRN: [id number] Chief Complaint: SI with plan to use fentanyl to harm himself and recently relapsed on methamphetamines and fentanyl.  Diagnoses:  Final diagnoses:  Amphetamine use disorder, severe, dependence (HCC)  Polysubstance use disorder  Bipolar II disorder (HCC)  Suicidal ideation    Brian [name] is a 37 year old male patient with a past psychiatric history significant for bipolar, depressed type, polysubstance abuse, and substance-induced mood disorder who presents to the [facility] behavioral health urgent care voluntary with c/o SI with plan to use fentanyl to harm himself and recently relapsed on methamphetamines and fentanyl.  Patient reports a sobriety of 7 months and states that he relapsed on fentanyl and methamphetamines a month and a half ago. He states that he was triggered by relationship issues with his ex-girlfriend. He states that he found out that he was the "side piece."  He states that he needs to get clean. He states that he is motivated to get clean because he has a 49 year old son that is his motivation.   He reports using IV fentanyl, a half a gram, every day for the past 2 weeks. He states that he last used fentanyl last night, a 10th.  He reports using fentanyl intermittently for the past 10 years. He reports a history of abusing heroin but denies recent use.  He reports using IV methamphetamines every day, for the past month and a half and states that he last used a half a gram of meth this morning. He reports using methamphetamines intermittently for the past 10 years.  Patient urine drug screen positive for amphetamines, buprenorphine, cocaine, methamphetamines, morphine, and THC.  He reports several past substance abuse treatments at [facility] in [place], [place] in [place], [address], and here at the [facility] facility based  crisis. He states that he last received substance abuse treatment 7 months ago here at the facility based crisis.  He states that he was kicked out of the [facility] [place] about a month ago because they could tell that he was using drugs.   He denies outpatient psychiatry and states that he follows up with Chales Abrahams at the [facility] for medication management. He states that he is prescribed gabapentin 300 mg TID, Seroquel 200 mg and Wellbutrin 150 mg daily for his mental health. He reports a history of bipolar depression. He states that he has been off of his medications for one month.  He denies a significant medical history. He states that the went to the [facility] yesterday because he has a rash on his right leg. He states that he was prescribed an antibiotic but he did not pick the medicine up from the pharmacy. Per chart review, per EDP note at [facility], patient was diagnosed with cellulitis of right leg and was prescribed Keflex 500 mg p.o. QID x 5 days and doxycycline 100 mg p.o. BID x 15 days.  On evaluation, patient is alert and oriented x 4. His thought process is linear and goal oriented. His speech is clear and coherent at a moderate tone. He endorses suicidal ideations with a plan to keep using fentanyl to hurt himself and tried to kill himself by using fentanyl everyday. He denies homicidal ideations. He denies auditory or visual hallucinations. There is no objective evidence that the patient is currently responding to internal or external stimuli. He has fair eye contact. He appears disheveled.  He is calm and cooperative and does not appear to be in acute distress.  Total Time spent with patient: 45 minutes  Musculoskeletal  Strength & Muscle Tone: within normal limits Gait & Station: normal Patient leans: N/A  Psychiatric Specialty Exam  Presentation General Appearance:  Disheveled  Eye Contact: Fair  Speech: Clear and Coherent  Speech  Volume: Normal  Handedness: Right   Mood and Affect  Mood: Anxious  Affect: Appropriate   Thought Process  Thought Processes: Coherent; Goal Directed  Descriptions of Associations:Intact  Orientation:Full (Time, Place and Person)  Thought Content:Logical  Diagnosis of Schizophrenia or Schizoaffective disorder in past: No   Hallucinations:Hallucinations: None  Ideas of Reference:None  Suicidal Thoughts:Suicidal Thoughts: Yes, Active SI Active Intent and/or Plan: With Plan  Homicidal Thoughts:Homicidal Thoughts: No   Sensorium  Memory: Immediate Fair; Recent Fair; Remote Fair  Judgment: Poor  Insight: Lacking   Executive Functions  Concentration: Fair  Attention Span: Fair  Recall: [employer] of Knowledge: Fair  Language: Fair   Psychomotor Activity  Psychomotor Activity: Psychomotor Activity: Normal   Assets  Assets: Manufacturing systems engineer; Desire for Improvement; Leisure Time; Physical Health   Sleep  Sleep: Sleep: Poor   Nutritional Assessment (For OBS and FBC admissions only) Has the patient had a weight loss or gain of 10 pounds or more in the last 3 months?: No Has the patient had a decrease in food intake/or appetite?: No Does the patient have dental problems?: No Does the patient have eating habits or behaviors that may be indicators of an eating disorder including binging or inducing vomiting?: No Has the patient recently lost weight without trying?: 0 Has the patient been eating poorly because of a decreased appetite?: 0 Malnutrition Screening Tool Score: 0    Physical Exam HENT:     Head: Normocephalic.     Nose: Nose normal.  Eyes:     Conjunctiva/sclera: Conjunctivae normal.  Cardiovascular:     Rate and Rhythm: Normal rate.  Pulmonary:     Effort: Pulmonary effort is normal.  Musculoskeletal:        General: Normal range of motion.     Cervical back: Normal range of motion.  Skin:    Comments: Cellulitis  to right LE (ankle)  Neurological:     Mental Status: He is alert and oriented to person, place, and time.    Review of Systems  Constitutional: Negative.   HENT: Negative.    Eyes: Negative.   Respiratory: Negative.    Cardiovascular: Negative.   Gastrointestinal: Negative.   Genitourinary: Negative.   Musculoskeletal: Negative.   Skin:        Rash to right leg.  Neurological: Negative.   Endo/Heme/Allergies: Negative.     Blood pressure 124/70, pulse 81, temperature 98.2 F (36.8 C), temperature source Oral, resp. rate 18, SpO2 100 %. There is no height or weight on file to calculate BMI.  Past Psychiatric History: history of bipolar , depressed type, and polysubstance abuse (benzo's, methamphetamine, fentanyl and heroin).   Is the patient at risk to self? Yes  Has the patient been a risk to self in the past 6 months? No .    Has the patient been a risk to self within the distant past? No   Is the patient a risk to others? No   Has the patient been a risk to others in the past 6 months? No   Has the patient been a risk to others within the distant past?  No   Past Medical History: Cellulitis to right LE (ankle).  Family History: Mother and grandmother history of history of bipolar.  Social History: Homeless. Unemployed. Methamphetamine and opioid abuse. One son age 35 years old.  Last Labs:  Admission on 04/17/2023  Component Date Value Ref Range Status   POC Amphetamine UR 04/17/2023 Positive (A)  NONE DETECTED (Cut Off Level 1000 ng/mL) Final   POC Secobarbital (BAR) 04/17/2023 None Detected  NONE DETECTED (Cut Off Level 300 ng/mL) Final   POC Buprenorphine (BUP) 04/17/2023 Positive (A)  NONE DETECTED (Cut Off Level 10 ng/mL) Final   POC Oxazepam (BZO) 04/17/2023 None Detected  NONE DETECTED (Cut Off Level 300 ng/mL) Final   POC Cocaine UR 04/17/2023 Positive (A)  NONE DETECTED (Cut Off Level 300 ng/mL) Final   POC Methamphetamine UR 04/17/2023 Positive (A)  NONE  DETECTED (Cut Off Level 1000 ng/mL) Final   POC Morphine 04/17/2023 Positive (A)  NONE DETECTED (Cut Off Level 300 ng/mL) Final   POC Methadone UR 04/17/2023 None Detected  NONE DETECTED (Cut Off Level 300 ng/mL) Final   POC Oxycodone UR 04/17/2023 None Detected  NONE DETECTED (Cut Off Level 100 ng/mL) Final   POC Marijuana UR 04/17/2023 Positive (A)  NONE DETECTED (Cut Off Level 50 ng/mL) Final  Admission on 04/16/2023, Discharged on 04/16/2023  Component Date Value Ref Range Status   Lactic Acid, Venous 04/16/2023 0.9  0.5 - 1.9 mmol/L Final   Performed at [facility], [address], [place], [place] [address]   Sodium 04/16/2023 134 (L)  135 - 145 mmol/L Final   Potassium 04/16/2023 3.7  3.5 - 5.1 mmol/L Final   Chloride 04/16/2023 102  98 - 111 mmol/L Final   CO2 04/16/2023 25  22 - 32 mmol/L Final   Glucose, Bld 04/16/2023 100 (H)  70 - 99 mg/dL Final   Glucose reference range applies only to samples taken after fasting for at least 8 hours.   BUN 04/16/2023 16  6 - 20 mg/dL Final   Creatinine, Ser 04/16/2023 1.41 (H)  0.61 - 1.24 mg/dL Final   Calcium 44/12/4740 8.8 (L)  8.9 - 10.3 mg/dL Final   Total Protein 59/56/3875 7.6  6.5 - 8.1 g/dL Final   Albumin 64/33/2951 4.1  3.5 - 5.0 g/dL Final   AST 88/41/6606 55 (H)  15 - 41 U/L Final   ALT 04/16/2023 46 (H)  0 - 44 U/L Final   Alkaline Phosphatase 04/16/2023 74  38 - 126 U/L Final   Total Bilirubin 04/16/2023 0.7  0.3 - 1.2 mg/dL Final   GFR, Estimated 04/16/2023 >60  >60 mL/min Final   Comment: (NOTE) Calculated using the CKD-EPI Creatinine Equation (2021)    Anion gap 04/16/2023 7  5 - 15 Final   Performed at [facility], [address], [place], [place] [address]   WBC 04/16/2023 9.1  4.0 - 10.5 K/uL Final   RBC 04/16/2023 4.10 (L)  4.22 - 5.81 MIL/uL Final   Hemoglobin 04/16/2023 12.1 (L)  13.0 - 17.0 g/dL Final   HCT 10/93/2355 37.6 (L)  39.0 - 52.0 % Final   MCV 04/16/2023 91.7   80.0 - 100.0 fL Final   MCH 04/16/2023 29.5  26.0 - 34.0 pg Final   MCHC 04/16/2023 32.2  30.0 - 36.0 g/dL Final   RDW 73/22/0254 13.5  11.5 - 15.5 % Final   Platelets 04/16/2023 201  150 - 400 K/uL Final   nRBC 04/16/2023  0.0  0.0 - 0.2 % Final   Neutrophils Relative % 04/16/2023 74  % Final   Neutro Abs 04/16/2023 6.7  1.7 - 7.7 K/uL Final   Lymphocytes Relative 04/16/2023 16  % Final   Lymphs Abs 04/16/2023 1.4  0.7 - 4.0 K/uL Final   Monocytes Relative 04/16/2023 10  % Final   Monocytes Absolute 04/16/2023 0.9  0.1 - 1.0 K/uL Final   Eosinophils Relative 04/16/2023 0  % Final   Eosinophils Absolute 04/16/2023 0.0  0.0 - 0.5 K/uL Final   Basophils Relative 04/16/2023 0  % Final   Basophils Absolute 04/16/2023 0.0  0.0 - 0.1 K/uL Final   Immature Granulocytes 04/16/2023 0  % Final   Abs Immature Granulocytes 04/16/2023 0.02  0.00 - 0.07 K/uL Final   Performed at [facility], [address], [place], [place] [address]   SARS Coronavirus 2 by RT PCR 04/16/2023 NEGATIVE  NEGATIVE Final   Comment: (NOTE) SARS-CoV-2 target nucleic acids are NOT DETECTED.  The SARS-CoV-2 RNA is generally detectable in upper respiratory specimens during the acute phase of infection. The lowest concentration of SARS-CoV-2 viral copies this assay can detect is 138 copies/mL. A negative result does not preclude SARS-Cov-2 infection and should not be used as the sole basis for treatment or other patient management decisions. A negative result may occur with  improper specimen collection/handling, submission of specimen other than nasopharyngeal swab, presence of viral mutation(s) within the areas targeted by this assay, and inadequate number of viral copies(<138 copies/mL). A negative result must be combined with clinical observations, patient history, and epidemiological information. The expected result is Negative.  Fact Sheet for Patients:   BloggerCourse.com  Fact Sheet for Healthcare Providers:  SeriousBroker.it  This test is no                          t yet approved or cleared by the [place] FDA and  has been authorized for detection and/or diagnosis of SARS-CoV-2 by FDA under an Emergency Use Authorization (EUA). This EUA will remain  in effect (meaning this test can be used) for the duration of the COVID-19 declaration under Section 564(b)(1) of the Act, 21 U.S.C.section 360bbb-3(b)(1), unless the authorization is terminated  or revoked sooner.       Influenza A by PCR 04/16/2023 NEGATIVE  NEGATIVE Final   Influenza B by PCR 04/16/2023 NEGATIVE  NEGATIVE Final   Comment: (NOTE) The Xpert Xpress SARS-CoV-2/FLU/RSV plus assay is intended as an aid in the diagnosis of influenza from Nasopharyngeal swab specimens and should not be used as a sole basis for treatment. Nasal washings and aspirates are unacceptable for Xpert Xpress SARS-CoV-2/FLU/RSV testing.  Fact Sheet for Patients: BloggerCourse.com  Fact Sheet for Healthcare Providers: SeriousBroker.it  This test is not yet approved or cleared by the [place] FDA and has been authorized for detection and/or diagnosis of SARS-CoV-2 by FDA under an Emergency Use Authorization (EUA). This EUA will remain in effect (meaning this test can be used) for the duration of the COVID-19 declaration under Section 564(b)(1) of the Act, 21 U.S.C. section 360bbb-3(b)(1), unless the authorization is terminated or revoked.     Resp Syncytial Virus by PCR 04/16/2023 NEGATIVE  NEGATIVE Final   Comment: (NOTE) Fact Sheet for Patients: BloggerCourse.com  Fact Sheet for Healthcare Providers: SeriousBroker.it  This test is not yet approved or cleared by the [place] FDA and has been authorized for detection and/or  diagnosis of SARS-CoV-2 by FDA under an Emergency Use Authorization (EUA). This EUA will remain in effect (meaning this test can be used) for the duration of the COVID-19 declaration under Section 564(b)(1) of the Act, 21 U.S.C. section 360bbb-3(b)(1), unless the authorization is terminated or revoked.  Performed at [facility], [address] [place], [place] [address]    Prothrombin Time 04/16/2023 13.9  11.4 - 15.2 seconds Final   INR 04/16/2023 1.1  0.8 - 1.2 Final   Comment: (NOTE) INR goal varies based on device and disease states. Performed at [facility], [address] [place], [place] [address]    aPTT 04/16/2023 28  24 - 36 seconds Final   Performed at [facility], [address] [place], [place] [address]   Specimen Source 04/16/2023 URINE, CLEAN CATCH   Final   Color, Urine 04/16/2023 YELLOW  YELLOW Final   APPearance 04/16/2023 CLEAR  CLEAR Final   Specific Gravity, Urine 04/16/2023 1.024  1.005 - 1.030 Final   pH 04/16/2023 5.0  5.0 - 8.0 Final   Glucose, UA 04/16/2023 NEGATIVE  NEGATIVE mg/dL Final   Hgb urine dipstick 04/16/2023 SMALL (A)  NEGATIVE Final   Bilirubin Urine 04/16/2023 NEGATIVE  NEGATIVE Final   Ketones, ur 04/16/2023 NEGATIVE  NEGATIVE mg/dL Final   Protein, ur 02/72/5366 30 (A)  NEGATIVE mg/dL Final   Nitrite 44/12/4740 NEGATIVE  NEGATIVE Final   Leukocytes,Ua 04/16/2023 NEGATIVE  NEGATIVE Final   RBC / HPF 04/16/2023 21-50  0 - 5 RBC/hpf Final   WBC, UA 04/16/2023 0-5  0 - 5 WBC/hpf Final   Comment:        Reflex urine culture not performed if WBC <=10, OR if Squamous epithelial cells >5. If Squamous epithelial cells >5 suggest recollection.    Bacteria, UA 04/16/2023 NONE SEEN  NONE SEEN Final   Squamous Epithelial / HPF 04/16/2023 0-5  0 - 5 /HPF Final   Mucus 04/16/2023 PRESENT   Final   Performed at [facility], [address], [place], [place] [address]    HIV Screen 4th Generation wRfx 04/16/2023 Non Reactive  Non Reactive Final   Performed at [facility], [address] [place], [place] [address]   RPR Ser Ql 04/16/2023 NON REACTIVE  NON REACTIVE Final   Performed at [facility], [address] [place], [place] [address]  Admission on 04/14/2023, Discharged on 04/15/2023  Component Date Value Ref Range Status   Sodium 04/14/2023 138  135 - 145 mmol/L Final   Potassium 04/14/2023 3.7  3.5 - 5.1 mmol/L Final   Chloride 04/14/2023 107  98 - 111 mmol/L Final   CO2 04/14/2023 24  22 - 32 mmol/L Final   Glucose, Bld 04/14/2023 127 (H)  70 - 99 mg/dL Final   Glucose reference range applies only to samples taken after fasting for at least 8 hours.   BUN 04/14/2023 10  6 - 20 mg/dL Final   Creatinine, Ser 04/14/2023 1.22  0.61 - 1.24 mg/dL Final   Calcium 18/84/1660 9.1  8.9 - 10.3 mg/dL Final   Total Protein 63/10/6008 7.6  6.5 - 8.1 g/dL Final   Albumin 93/23/5573 4.0  3.5 - 5.0 g/dL Final   AST 22/11/5425 52 (H)  15 - 41 U/L Final   ALT 04/14/2023 45 (H)  0 - 44 U/L Final   Alkaline Phosphatase 04/14/2023 75  38 - 126 U/L Final   Total  Bilirubin 04/14/2023 0.4  0.3 - 1.2 mg/dL Final   GFR, Estimated 04/14/2023 >60  >60 mL/min Final   Comment: (NOTE) Calculated using the CKD-EPI Creatinine Equation (2021)    Anion gap 04/14/2023 7  5 - 15 Final   Performed at [facility], [address] [place], [place] [address]   Alcohol, Ethyl (B) 04/14/2023 <10  <10 mg/dL Final   Comment: (NOTE) Lowest detectable limit for serum alcohol is 10 mg/dL.  For medical purposes only. Performed at [facility], [address], [place], [place] [address]    Salicylate Lvl 04/14/2023 <7.0 (L)  7.0 - 30.0 mg/dL Final   Performed at [facility], [address] [place], [place] [address]   Acetaminophen (Tylenol), Serum 04/14/2023 <10 (L)  10 - 30 ug/mL Final   Comment:  (NOTE) Therapeutic concentrations vary significantly. A range of 10-30 ug/mL  may be an effective concentration for many patients. However, some  are best treated at concentrations outside of this range. Acetaminophen concentrations >150 ug/mL at 4 hours after ingestion  and >50 ug/mL at 12 hours after ingestion are often associated with  toxic reactions.  Performed at [facility], [address], [place], [place] [address]    WBC 04/14/2023 3.6 (L)  4.0 - 10.5 K/uL Final   RBC 04/14/2023 4.40  4.22 - 5.81 MIL/uL Final   Hemoglobin 04/14/2023 13.3  13.0 - 17.0 g/dL Final   HCT 29/56/2130 40.7  39.0 - 52.0 % Final   MCV 04/14/2023 92.5  80.0 - 100.0 fL Final   MCH 04/14/2023 30.2  26.0 - 34.0 pg Final   MCHC 04/14/2023 32.7  30.0 - 36.0 g/dL Final   RDW 86/57/8469 14.0  11.5 - 15.5 % Final   Platelets 04/14/2023 225  150 - 400 K/uL Final   nRBC 04/14/2023 0.0  0.0 - 0.2 % Final   Performed at [facility], [address] [place], [place] [address]  Admission on 04/11/2023, Discharged on 04/11/2023  Component Date Value Ref Range Status   Sodium 04/11/2023 139  135 - 145 mmol/L Final   Potassium 04/11/2023 4.1  3.5 - 5.1 mmol/L Final   Chloride 04/11/2023 101  98 - 111 mmol/L Final   CO2 04/11/2023 26  22 - 32 mmol/L Final   Glucose, Bld 04/11/2023 93  70 - 99 mg/dL Final   Glucose reference range applies only to samples taken after fasting for at least 8 hours.   BUN 04/11/2023 20  6 - 20 mg/dL Final   Creatinine, Ser 04/11/2023 1.73 (H)  0.61 - 1.24 mg/dL Final   Calcium 84/13/2440 9.2  8.9 - 10.3 mg/dL Final   Total Protein 08/23/2535 6.9  6.5 - 8.1 g/dL Final   Albumin 64/40/3474 3.7  3.5 - 5.0 g/dL Final   AST 25/95/6387 48 (H)  15 - 41 U/L Final   ALT 04/11/2023 36  0 - 44 U/L Final   Alkaline Phosphatase 04/11/2023 70  38 - 126 U/L Final   Total Bilirubin 04/11/2023 0.3  0.3 - 1.2 mg/dL Final   GFR, Estimated 04/11/2023 51 (L)  >60  mL/min Final   Comment: (NOTE) Calculated using the CKD-EPI Creatinine Equation (2021)    Anion gap 04/11/2023 12  5 - 15 Final   Performed at [facility], [address] [place], [place] [address]   Alcohol, Ethyl (B) 04/11/2023 <10  <10 mg/dL Final   Comment: (NOTE)  Lowest detectable limit for serum alcohol is 10 mg/dL.  For medical purposes only. Performed at [facility], [address] [place], [place] [address]    Salicylate Lvl 04/11/2023 <7.0 (L)  7.0 - 30.0 mg/dL Final   Performed at [facility], [address] [place], [place] [address]   Acetaminophen (Tylenol), Serum 04/11/2023 <10 (L)  10 - 30 ug/mL Final   Comment: (NOTE) Therapeutic concentrations vary significantly. A range of 10-30 ug/mL  may be an effective concentration for many patients. However, some  are best treated at concentrations outside of this range. Acetaminophen concentrations >150 ug/mL at 4 hours after ingestion  and >50 ug/mL at 12 hours after ingestion are often associated with  toxic reactions.  Performed at [facility], [address] [place], [place] [address]    WBC 04/11/2023 6.8  4.0 - 10.5 K/uL Final   RBC 04/11/2023 3.95 (L)  4.22 - 5.81 MIL/uL Final   Hemoglobin 04/11/2023 12.0 (L)  13.0 - 17.0 g/dL Final   HCT 29/56/2130 37.9 (L)  39.0 - 52.0 % Final   MCV 04/11/2023 95.9  80.0 - 100.0 fL Final   MCH 04/11/2023 30.4  26.0 - 34.0 pg Final   MCHC 04/11/2023 31.7  30.0 - 36.0 g/dL Final   RDW 86/57/8469 13.6  11.5 - 15.5 % Final   Platelets 04/11/2023 254  150 - 400 K/uL Final   nRBC 04/11/2023 0.0  0.0 - 0.2 % Final   Performed at [facility], [address], [place], [place] [address]   Opiates 04/11/2023 NONE DETECTED  NONE DETECTED Final   Cocaine 04/11/2023 POSITIVE (A)  NONE DETECTED Final   Benzodiazepines 04/11/2023 NONE DETECTED  NONE DETECTED Final   Amphetamines 04/11/2023 POSITIVE (A)  NONE DETECTED Final   Tetrahydrocannabinol  04/11/2023 POSITIVE (A)  NONE DETECTED Final   Barbiturates 04/11/2023 NONE DETECTED  NONE DETECTED Final   Comment: (NOTE) DRUG SCREEN FOR MEDICAL PURPOSES ONLY.  IF CONFIRMATION IS NEEDED FOR ANY PURPOSE, NOTIFY LAB WITHIN 5 DAYS.  LOWEST DETECTABLE LIMITS FOR URINE DRUG SCREEN Drug Class                     Cutoff (ng/mL) Amphetamine and metabolites    1000 Barbiturate and metabolites    200 Benzodiazepine                 200 Opiates and metabolites        300 Cocaine and metabolites        300 THC                            50 Performed at [facility], [address], [place], [place] [address]   Admission on 04/10/2023, Discharged on 04/10/2023  Component Date Value Ref Range Status   Sodium 04/10/2023 139  135 - 145 mmol/L Final   Potassium 04/10/2023 4.0  3.5 - 5.1 mmol/L Final   Chloride 04/10/2023 105  98 - 111 mmol/L Final   CO2 04/10/2023 26  22 - 32 mmol/L Final   Glucose, Bld 04/10/2023 144 (H)  70 - 99 mg/dL Final   Glucose reference range applies only to samples taken after fasting for at least 8 hours.   BUN 04/10/2023 14  6 - 20 mg/dL Final   Creatinine, Ser 04/10/2023 1.71 (H)  0.61 - 1.24 mg/dL Final   Calcium  04/10/2023 9.5  8.9 - 10.3 mg/dL Final   Total Protein 20/25/4270 7.9  6.5 - 8.1 g/dL Final   Albumin 62/37/6283 4.4  3.5 - 5.0 g/dL Final   AST 15/17/6160 46 (H)  15 - 41 U/L Final   ALT 04/10/2023 38  0 - 44 U/L Final   Alkaline Phosphatase 04/10/2023 80  38 - 126 U/L Final   Total Bilirubin 04/10/2023 0.6  0.3 - 1.2 mg/dL Final   GFR, Estimated 04/10/2023 52 (L)  >60 mL/min Final   Comment: (NOTE) Calculated using the CKD-EPI Creatinine Equation (2021)    Anion gap 04/10/2023 8  5 - 15 Final   Performed at [facility], [address] [place], [place] [address]   Alcohol, Ethyl (B) 04/10/2023 <10  <10 mg/dL Final   Comment: (NOTE) Lowest detectable limit for serum alcohol is 10 mg/dL.  For medical purposes  only. Performed at [facility], [address] [place], [place] [address]    Salicylate Lvl 04/10/2023 <7.0 (L)  7.0 - 30.0 mg/dL Final   Performed at [facility], [address] [place], [place] [address]   Acetaminophen (Tylenol), Serum 04/10/2023 <10 (L)  10 - 30 ug/mL Final   Comment: (NOTE) Therapeutic concentrations vary significantly. A range of 10-30 ug/mL  may be an effective concentration for many patients. However, some  are best treated at concentrations outside of this range. Acetaminophen concentrations >150 ug/mL at 4 hours after ingestion  and >50 ug/mL at 12 hours after ingestion are often associated with  toxic reactions.  Performed at [facility], [address] [place], [place] [address]    WBC 04/10/2023 10.9 (H)  4.0 - 10.5 K/uL Final   RBC 04/10/2023 4.53  4.22 - 5.81 MIL/uL Final   Hemoglobin 04/10/2023 13.6  13.0 - 17.0 g/dL Final   HCT 09/38/1829 42.4  39.0 - 52.0 % Final   MCV 04/10/2023 93.6  80.0 - 100.0 fL Final   MCH 04/10/2023 30.0  26.0 - 34.0 pg Final   MCHC 04/10/2023 32.1  30.0 - 36.0 g/dL Final   RDW 93/71/6967 13.5  11.5 - 15.5 % Final   Platelets 04/10/2023 285  150 - 400 K/uL Final   nRBC 04/10/2023 0.0  0.0 - 0.2 % Final   Performed at [facility], [address], [place], [place] [address]  Admission on 01/06/2023, Discharged on 01/06/2023  Component Date Value Ref Range Status   Sodium 01/06/2023 139  135 - 145 mmol/L Final   Potassium 01/06/2023 3.9  3.5 - 5.1 mmol/L Final   Chloride 01/06/2023 102  98 - 111 mmol/L Final   CO2 01/06/2023 26  22 - 32 mmol/L Final   Glucose, Bld 01/06/2023 114 (H)  70 - 99 mg/dL Final   Glucose reference range applies only to samples taken after fasting for at least 8 hours.   BUN 01/06/2023 10  6 - 20 mg/dL Final   Creatinine, Ser 01/06/2023 1.24  0.61 - 1.24 mg/dL Final   Calcium 01/75/1025 8.7 (L)  8.9 - 10.3 mg/dL  Final   GFR, Estimated 01/06/2023 >60  >60 mL/min Final   Comment: (NOTE) Calculated using the CKD-EPI Creatinine Equation (2021)    Anion gap 01/06/2023 11  5 - 15 Final   Performed at [facility], [address], [place], [place] [address]   WBC 01/06/2023 8.7  4.0 - 10.5 K/uL Final   RBC 01/06/2023  4.60  4.22 - 5.81 MIL/uL Final   Hemoglobin 01/06/2023 14.0  13.0 - 17.0 g/dL Final   HCT 40/98/1191 41.9  39.0 - 52.0 % Final   MCV 01/06/2023 91.1  80.0 - 100.0 fL Final   MCH 01/06/2023 30.4  26.0 - 34.0 pg Final   MCHC 01/06/2023 33.4  30.0 - 36.0 g/dL Final   RDW 47/82/9562 15.2  11.5 - 15.5 % Final   Platelets 01/06/2023 257  150 - 400 K/uL Final   nRBC 01/06/2023 0.0  0.0 - 0.2 % Final   Performed at [facility], [address] [place], [place] [address]   Troponin I (High Sensitivity) 01/06/2023 3  <18 ng/L Final   Comment: (NOTE) Elevated high sensitivity troponin I (hsTnI) values and significant  changes across serial measurements may suggest ACS but many other  chronic and acute conditions are known to elevate hsTnI results.  Refer to the "Links" section for chest pain algorithms and additional  guidance. Performed at [facility], [address], [place], [place] [address]     Allergies: Fish allergy and Shellfish allergy  Medications:  Facility Ordered Medications  Medication   acetaminophen (TYLENOL) tablet 650 mg   alum & mag hydroxide-simeth (MAALOX/MYLANTA) 200-200-20 MG/5ML suspension 30 mL   magnesium hydroxide (MILK OF MAGNESIA) suspension 30 mL   hydrOXYzine (ATARAX) tablet 25 mg   traZODone (DESYREL) tablet 50 mg   cephALEXin (KEFLEX) capsule 500 mg   doxycycline (VIBRA-TABS) tablet 100 mg   PTA Medications  Medication Sig   gabapentin (NEURONTIN) 300 MG capsule Take 1 capsule (300 mg total) by mouth 3 (three) times daily.   hydrOXYzine (ATARAX) 25 MG tablet Take 1 tablet (25 mg total) by mouth 3 (three) times daily as needed for  anxiety.   LIPITOR 80 MG tablet Take 1 tablet (80 mg total) by mouth at bedtime.   QUEtiapine (SEROQUEL) 200 MG tablet Take 1 tablet (200 mg total) by mouth at bedtime.   isosorbide dinitrate (ISORDIL) 30 MG tablet Take 15 mg by mouth daily.   buPROPion (WELLBUTRIN XL) 300 MG 24 hr tablet Take 300 mg by mouth daily.   doxycycline (VIBRAMYCIN) 100 MG capsule Take 1 capsule (100 mg total) by mouth 2 (two) times daily.   cephALEXin (KEFLEX) 500 MG capsule Take 1 capsule (500 mg total) by mouth 4 (four) times daily.      Medical Decision Making  Patient is recommended for inpatient psychiatric treatment for active SI, and mood stabilization. Patient will be admitted to the [facility] behavioral health urgent care continuous assessment unit while he awaits inpatient psychiatric placement.  Patient is voluntary. Patient accepted to [facility] today.   Lab Orders         SARS Coronavirus 2 by RT PCR (hospital order, performed in [facility] hospital lab) *cepheid single result test* Anterior Nasal Swab         CBC with Differential/Platelet         Comprehensive metabolic panel         Hemoglobin A1c         Ethanol         Lipid panel         TSH         Hepatitis panel, acute         RPR         HIV Antibody (routine testing w rflx)         POCT Urine  Drug Screen - (I-Screen)    EKG  Medication regimen: Will restart Keflex 500 mg po QID x 5 days for cellulitis of right leg Will restart doxycycline 100 mg po BID x 15 days for cellulitis of the right leg Will start clonidine 0.1 taper protocol for opiate withdrawal Will restart Seroquel 100 mg po at bedtime for mood stabilization    Recommendations  Based on my evaluation the patient does not appear to have an emergency medical condition.  Layla Barter, NP 04/17/23  9:17 AM

## 2023-04-17 NOTE — Progress Notes (Signed)
Pt was accepted to [facility] TODAY 04/17/2023. Bed assignment: 304-1  Pt meets inpatient criteria per Liborio Nixon, NP  Attending Physician will be Uvaldo Rising, MD  Report can be called to: - Adult unit: [phone]  Pt can arrive after 2 PM  Care Team Notified: [facility] [place], RN, Liborio Nixon, NP, Tyrell Antonio, RN, and Sharion Dove, RN  [place], [place]  04/17/2023 11:52 AM

## 2023-04-17 NOTE — ED Notes (Signed)
Blood draw attempt x1 to left ac, unsuccessful. Patient encouraged to drink more fluids

## 2023-04-17 NOTE — ED Notes (Signed)
Report given to Lurena Joiner, RN @[facility] . Pt can arrive at 2pm.

## 2023-04-17 NOTE — ED Notes (Signed)
Patient A&O x 4, ambulatory. Patient transferred to [facility] in no acute distress. Patient denied SI/HI, A/VH upon discharge. Patient verbalized understanding of all discharge instructions explained by staff, to include follow up care to [facility] and safety plan. Pt belongings returned to patient from locker intact. Patient escorted to [facility] via staff for transport to [facility]. Safety maintained.

## 2023-04-18 DIAGNOSIS — F411 Generalized anxiety disorder: Secondary | ICD-10-CM | POA: Diagnosis present

## 2023-04-18 DIAGNOSIS — F1193 Opioid use, unspecified with withdrawal: Secondary | ICD-10-CM | POA: Diagnosis present

## 2023-04-18 DIAGNOSIS — F3181 Bipolar II disorder: Secondary | ICD-10-CM | POA: Diagnosis not present

## 2023-04-18 DIAGNOSIS — E7800 Pure hypercholesterolemia, unspecified: Secondary | ICD-10-CM | POA: Diagnosis present

## 2023-04-18 LAB — CULTURE, BLOOD (ROUTINE X 2): Special Requests: ADEQUATE

## 2023-04-18 MED ORDER — QUETIAPINE FUMARATE 50 MG PO TABS
50.0000 mg | ORAL_TABLET | Freq: Every day | ORAL | Status: DC
  Filled 2023-04-18 (×2): qty 1

## 2023-04-18 MED ORDER — ATORVASTATIN CALCIUM 40 MG PO TABS
40.0000 mg | ORAL_TABLET | Freq: Every day | ORAL | Status: DC
  Administered 2023-04-19 – 2023-04-23 (×5): 40 mg via ORAL
  Filled 2023-04-18 (×6): qty 1

## 2023-04-18 MED ORDER — BUPROPION HCL ER (XL) 150 MG PO TB24
150.0000 mg | ORAL_TABLET | Freq: Every day | ORAL | Status: DC
  Administered 2023-04-19 – 2023-04-21 (×3): 150 mg via ORAL
  Filled 2023-04-18 (×5): qty 1

## 2023-04-18 MED ORDER — QUETIAPINE FUMARATE 200 MG PO TABS
200.0000 mg | ORAL_TABLET | Freq: Every day | ORAL | Status: DC
  Administered 2023-04-18 – 2023-04-22 (×5): 200 mg via ORAL
  Filled 2023-04-18 (×8): qty 1

## 2023-04-18 MED ORDER — GABAPENTIN 100 MG PO CAPS
200.0000 mg | ORAL_CAPSULE | Freq: Three times a day (TID) | ORAL | Status: DC
  Administered 2023-04-18 – 2023-04-23 (×14): 200 mg via ORAL
  Filled 2023-04-18 (×19): qty 2

## 2023-04-18 NOTE — Progress Notes (Signed)
   04/18/23 1000  Psych Admission Type (Psych Patients Only)  Admission Status Voluntary  Psychosocial Assessment  Patient Complaints Substance abuse  Eye Contact Brief  Facial Expression Anxious  Affect Anxious  Speech Pressured;Logical/coherent  Interaction Assertive  Motor Activity Fidgety  Appearance/Hygiene Unremarkable  Behavior Characteristics Restless;Hyperactive;Irritable;Agitated;Anxious  Mood Anxious;Irritable  Aggressive Behavior  Effect No apparent injury  Thought Process  Coherency WDL  Content Blaming others  Delusions None reported or observed  Perception WDL  Hallucination None reported or observed  Judgment WDL  Confusion WDL  Danger to Self  Current suicidal ideation? Denies  Self-Injurious Behavior No self-injurious ideation or behavior indicators observed or expressed   Agreement Not to Harm Self Yes  Description of Agreement verbal contract for safety  Danger to Others  Danger to Others None reported or observed

## 2023-04-18 NOTE — Progress Notes (Signed)
Adult Psychoeducational Group Note  Date:  04/18/2023 Time:  11:03 PM  Group Topic/Focus:  Wrap-Up Group:   The focus of this group is to help patients review their daily goal of treatment and discuss progress on daily workbooks.  Participation Level:  Did Not Attend  Participation Quality:     Pt/ did not attend. Affect:  Appropriate  Cognitive:  Appropriate  Insight: Appropriate  Engagement in Group:  Lacking  Modes of Intervention:  Discussion  Additional Comments:  Pt did did not attend group tonight.  Joselyn Arrow 04/18/2023, 11:03 PM

## 2023-04-18 NOTE — [facility] Group Notes (Signed)
Pt did not attend goals group. 

## 2023-04-18 NOTE — [facility] Suicide Risk Assessment (Signed)
Suicide Risk Assessment  Admission Assessment    [facility] Admission Suicide Risk Assessment   Nursing information obtained from:  Patient Demographic factors:  Male Current Mental Status:  Suicidal ideation indicated by patient Loss Factors:  Financial problems / change in socioeconomic status, Loss of significant relationship Historical Factors:  Prior suicide attempts Risk Reduction Factors:  Positive social support  Total Time spent with patient: 1.5 hours Principal Problem: Bipolar 2 disorder, major depressive episode (HCC) Diagnosis:  Principal Problem:   Bipolar 2 disorder, major depressive episode (HCC) Active Problems:   Opioid use disorder   Methamphetamine abuse (HCC)   Opioid use with withdrawal (HCC)   GAD (generalized anxiety disorder)   Pure hypercholesterolemia  Reason for Admission: Brian [name] is a 37 yo CM with a prior mental health history of ADHD, bipolar 2 disorder, PTSD, anxiety, who presented to the [facility] on 06/21 with complaints of worsening depressive symptoms and SI with a plan to overdose on fentanyl in the context of his recent relapse.  Patient reported being kicked out from an [place] house where he was staying due to his relapse and is now homeless.  He was transferred voluntarily to this behavioral health Hospital for treatment and stabilization of his mental status on 06/21.   Continued Clinical Symptoms: Withdrawing from opioids, continues to present with depressive symptoms and in need of treatment and stabilization of mental status prior to discharge.  Alcohol Use Disorder Identification Test Final Score (AUDIT): 0 The "Alcohol Use Disorders Identification Test", Guidelines for Use in Primary Care, Second Edition.  [employer] [facility]). Score between 0-7:  no or low risk or alcohol related problems. Score between 8-15:  moderate risk of alcohol related problems. Score between 16-19:  high risk of  alcohol related problems. Score 20 or above:  warrants further diagnostic evaluation for alcohol dependence and treatment.  CLINICAL FACTORS:   Bipolar Disorder:   Bipolar II  Musculoskeletal: Strength & Muscle Tone: within normal limits Gait & Station: normal Patient leans: N/A  Psychiatric Specialty Exam:  Presentation  General Appearance:  Appropriate for Environment  Eye Contact: Fair  Speech: Clear and Coherent  Speech Volume: Normal  Handedness: Right   Mood and Affect  Mood: Depressed; Anxious  Affect: Congruent   Thought Process  Thought Processes: Coherent  Descriptions of Associations:Intact  Orientation:Full (Time, Place and Person)  Thought Content:Logical  History of Schizophrenia/Schizoaffective disorder:No  Duration of Psychotic Symptoms:No data recorded Hallucinations:Hallucinations: None  Ideas of Reference:None  Suicidal Thoughts:Suicidal Thoughts: No SI Active Intent and/or Plan: With Plan  Homicidal Thoughts:Homicidal Thoughts: No   Sensorium  Memory: Immediate Good  Judgment: Poor  Insight: Poor   Executive Functions  Concentration: Poor  Attention Span: Fair  Recall: Poor  Fund of Knowledge: Poor  Language: Fair   Psychomotor Activity  Psychomotor Activity: Psychomotor Activity: Normal   Assets  Assets: Resilience   Sleep  Sleep: Sleep: Poor    Physical Exam: Physical Exam Review of Systems  Psychiatric/Behavioral:  Positive for depression and substance abuse. Negative for hallucinations, memory loss and suicidal ideas. The patient is nervous/anxious and has insomnia.    Blood pressure 103/68, pulse 66, temperature 97.8 F (36.6 C), temperature source Oral, resp. rate 18, height 5\' 8"  (1.727 m), weight 68 kg, SpO2 100 %. Body mass index is 22.81 kg/m.   COGNITIVE FEATURES THAT CONTRIBUTE TO RISK:  None    SUICIDE RISK:   Mild:  Suicidal ideation of limited frequency,  intensity,  duration, and specificity.  There are no identifiable plans, no associated intent, mild dysphoria and related symptoms, good self-control (both objective and subjective assessment), few other risk factors, and identifiable protective factors, including available and accessible social support.  PLAN OF CARE: See H & P  I certify that inpatient services furnished can reasonably be expected to improve the patient's condition.   Starleen Blue, NP 04/18/2023, 6:24 PM

## 2023-04-18 NOTE — Progress Notes (Signed)
   04/18/23 2253  Psych Admission Type (Psych Patients Only)  Admission Status Voluntary  Psychosocial Assessment  Patient Complaints Substance abuse  Eye Contact Brief  Facial Expression Anxious;Animated  Affect Appropriate to circumstance  Furniture conservator/restorer;Restless  Appearance/Hygiene Unremarkable  Behavior Characteristics Appropriate to situation;Anxious;Irritable  Mood Anxious;Labile;Irritable  Thought Process  Coherency WDL  Content Blaming others  Delusions None reported or observed  Perception WDL  Hallucination None reported or observed  Judgment Impaired  Confusion None  Danger to Self  Current suicidal ideation? Denies  Self-Injurious Behavior No self-injurious ideation or behavior indicators observed or expressed   Agreement Not to Harm Self Yes  Description of Agreement verbal  Danger to Others  Danger to Others None reported or observed

## 2023-04-18 NOTE — Progress Notes (Addendum)
D. Pt presented irritable, agitated upon approach- agreed to take am meds (clonidine, antibiotics), but refused Seroquel - Provider made aware. Pt observed standing in the hallway, pacing at times,fidgety, and did not attend goals group despite encouragement from staff. Per MHT, pt seen throwing the tissue box down the hallway. Pt currently denies SI/HI and AVH   A. Labs and vitals monitored. Pt given and educated on medications. Pt supported emotionally and encouraged to express concerns and ask questions.   R. Pt remains safe with 15 minute checks. Will continue POC.
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
## 2023-04-18 NOTE — Progress Notes (Signed)
   04/18/23 0602  15 Minute Checks  Location Bedroom  Visual Appearance Calm  Behavior Composed  Sleep (Behavioral Health Patients Only)  Calculate sleep? (Click Yes once per 24 hr at 0600 safety check) Yes  Documented sleep last 24 hours 7
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
## 2023-04-19 DIAGNOSIS — F3181 Bipolar II disorder: Secondary | ICD-10-CM | POA: Diagnosis not present

## 2023-04-19 LAB — LIPID PANEL
Cholesterol: 120 mg/dL (ref 0–200)
HDL: 45 mg/dL (ref >40)
LDL Cholesterol: 54 mg/dL (ref 0–99)
Total CHOL/HDL Ratio: 2.7 RATIO
Triglycerides: 105 mg/dL (ref <150)
VLDL: 21 mg/dL (ref 0–40)

## 2023-04-19 LAB — TSH: TSH: 4.408 u[IU]/mL (ref 0.350–4.500)

## 2023-04-19 LAB — HEMOGLOBIN A1C
Hgb A1c MFr Bld: 5.7 % — ABNORMAL HIGH (ref 4.8–5.6)
Mean Plasma Glucose: 116.89 mg/dL

## 2023-04-19 LAB — CULTURE, BLOOD (ROUTINE X 2)

## 2023-04-19 LAB — VITAMIN D 25 HYDROXY (VIT D DEFICIENCY, FRACTURES): Vit D, 25-Hydroxy: 32.75 ng/mL (ref 30–100)

## 2023-04-19 LAB — HEPATITIS PANEL, ACUTE
HCV Ab: REACTIVE — AB
Hep A IgM: NONREACTIVE
Hep B C IgM: NONREACTIVE
Hepatitis B Surface Ag: NONREACTIVE

## 2023-04-19 MED ORDER — METHOCARBAMOL 500 MG PO TABS
500.0000 mg | ORAL_TABLET | Freq: Three times a day (TID) | ORAL | Status: DC
  Administered 2023-04-19 – 2023-04-22 (×8): 500 mg via ORAL
  Filled 2023-04-19 (×12): qty 1

## 2023-04-19 MED ORDER — NAPROXEN 500 MG PO TABS
500.0000 mg | ORAL_TABLET | Freq: Two times a day (BID) | ORAL | Status: DC
  Administered 2023-04-19 – 2023-04-23 (×8): 500 mg via ORAL
  Filled 2023-04-19 (×10): qty 1

## 2023-04-19 NOTE — [facility] Group Notes (Signed)
LCSW Wellness Group Note   04/19/2023 09:30am  Type of Group and Topic: Psychoeducational Group:  Wellness  Participation Level:  minimal  Description of Group  Wellness group introduces the topic and its focus on developing healthy habits across the spectrum and its relationship to a decrease in hospital admissions.  Six areas of wellness are discussed: physical, social spiritual, intellectual, occupational, and emotional.  Patients are asked to consider their current wellness habits and to identify areas of wellness where they are interested and able to focus on improvements.    Therapeutic Goals Patients will understand components of wellness and how they can positively impact overall health.  Patients will identify areas of wellness where they have developed good habits. Patients will identify areas of wellness where they would like to make improvements.    Summary of Patient Progress: pt attended group, did answer questions when directly called upon.  Appeared attentive but then left towards the end prior to identifying wellness areas of strength and weakness from his own life.      Therapeutic Modalities: Cognitive Behavioral Therapy Psychoeducation    Lorri Frederick, LCSW

## 2023-04-19 NOTE — Group Note (Unsigned)
Date:  04/19/2023 Time:  9:24 AM  Group Topic/Focus:  Orientation:   The focus of this group is to educate the patient on the purpose and policies of crisis stabilization and provide a format to answer questions about their admission.  The group details unit policies and expectations of patients while admitted.     Participation Level:  {[facility] PARTICIPATION LEVEL:22264}  Participation Quality:  {[facility] PARTICIPATION QUALITY:22265}  Affect:  {[facility] AFFECT:22266}  Cognitive:  {[facility] COGNITIVE:22267}  Insight: {BHH Insight2:20797}  Engagement in Group:  {[facility] ENGAGEMENT IN GROUP:22268}  Modes of Intervention:  {[facility] MODES OF INTERVENTION:22269}  Additional Comments:  ***  Brian [name] 04/19/2023, 9:24 AM  

## 2023-04-19 NOTE — Group Note (Signed)
Date:  04/19/2023 Time:  4:45 PM  Group Topic/Focus:  Wellness Toolbox:   The focus of this group is to discuss various aspects of wellness, balancing those aspects and exploring ways to increase the ability to experience wellness.  Patients will create a wellness toolbox for use upon discharge.    Participation Level:  None  Participation Quality:  Inattentive  Affect:  Defensive  Cognitive:  Lacking  Insight: Lacking  Engagement in Group:  Lacking  Modes of Intervention:  Education  Additional Comments:     Reymundo Poll 04/19/2023, 4:45 PM

## 2023-04-19 NOTE — Group Note (Signed)
Date:  04/19/2023 Time:  9:31 AM  Group Topic/Focus:  Goals Group:   The focus of this group is to help patients establish daily goals to achieve during treatment and discuss how the patient can incorporate goal setting into their daily lives to aide in recovery.    Participation Level:  None  Participation Quality:  Intrusive  Affect:  Resistant  Cognitive:  Lacking  Insight: Lacking  Engagement in Group:  Resistant  Modes of Intervention:  Discussion  Additional Comments:     Reymundo Poll 04/19/2023, 9:31 AM

## 2023-04-19 NOTE — Progress Notes (Signed)
[facility] MD Progress Note  04/19/2023 2:21 PM Brian [name]  MRN:  [id number] Principal Problem: Bipolar 2 disorder, major depressive episode (HCC) Diagnosis: Principal Problem:   Bipolar 2 disorder, major depressive episode (HCC) Active Problems:   Opioid use disorder   Methamphetamine abuse (HCC)   Opioid use with withdrawal (HCC)   GAD (generalized anxiety disorder)   Pure hypercholesterolemia  Reason for Admission: Brian [name] is a 37 yo CM with a prior mental health history of ADHD, bipolar 2 disorder, PTSD, anxiety, who presented to the [facility] on 06/21 with complaints of worsening depressive symptoms and SI with a plan to overdose on fentanyl in the context of his recent relapse.  Patient reported being kicked out from an [place] house where he was staying due to his relapse and is now homeless.  He was transferred voluntarily to this behavioral health Hospital for treatment and stabilization of his mental status on 06/21.    24 hr chart review: Sleep Hours last night: Good as per patient Nursing Concerns: None reported Behavioral episodes in the past 24 hrs: None Medication Compliance: Compliant Vital Signs in the past 24 hrs: Hypotension earlier today morning, now within normal limits with exception of low heart rate of 53.  Continuing to monitor this. PRN Medications in the past 24 hrs: Hydroxyzine, Bentyl & Trazodone   Patient assessment note: Pt with an irritable and depressed mood, affect is congruent. His attention to personal hygiene and grooming is fair, eye contact is fair, speech is clear & coherent. Thought contents are organized and logical, and pt currently denies SI/HI/AVH or paranoia. There is no evidence of delusional thoughts.   Pt reports a good sleep quality last night, reports a poor appetite related to stomach cramps most likely related to his, Opioid withdrawals. He reports generalized malaise, and an overall sense of not  feeling well.  We discussed scheduling his Robaxin along with naproxen to help with withdrawal symptoms.  Patient was agreeable to this plan.  He is also agreeable to continuing medications as listed below with no other changes today.  Continues hospitalization remains necessary for treatment and stabilization of mental status, as patient is continuing to exhibit signs and symptoms of opioid withdrawals and is not stable enough for discharge at this time.    Patient's treatment team to revisit discharge planning on an ongoing basis, patient continues to be interested in substance abuse rehab.  Educated that CSW will explore this for him. Fluids being encouraged for low HR, pt is asymptomatic.   Total Time spent with patient: 45 minutes  Past Psychiatric History: See H & P  Past Medical History:  Past Medical History:  Diagnosis Date   Alcohol abuse    Anxiety    Bipolar 2 disorder (HCC)    Cocaine abuse (HCC)    Depression    Kidney stones    Methamphetamine abuse, episodic (HCC)    Myocardial infarction (HCC)    Narcotic abuse (HCC)    Peptic ulcer    Polysubstance abuse (HCC)    Renal disorder    kidney stones    Past Surgical History:  Procedure Laterality Date   ESOPHAGOGASTRODUODENOSCOPY N/A 05/29/2014   Procedure: ESOPHAGOGASTRODUODENOSCOPY (EGD) ;  Surgeon: Theda Belfast, MD;  Location: [facility] ENDOSCOPY;  Service: Endoscopy;  Laterality: N/A;  check with Dr. Loreta Ave about sedation type/timinng - I recommend MAC   HAND SURGERY  2006   LEFT HEART CATH AND CORONARY ANGIOGRAPHY N/A 06/21/2020  Procedure: LEFT HEART CATH AND CORONARY ANGIOGRAPHY;  Surgeon: Yvonne Kendall, MD;  Location: [facility] INVASIVE CV LAB;  Service: Cardiovascular;  Laterality: N/A;   Family History:  Family History  Problem Relation Age of Onset   Heart disease Father    Family Psychiatric  History: See H & P Social History:  Social History   Substance and Sexual Activity  Alcohol Use Not Currently      Social History   Substance and Sexual Activity  Drug Use Yes   Types: IV, Methamphetamines    Social History   Socioeconomic History   Marital status: Single    Spouse name: Not on file   Number of children: Not on file   Years of education: Not on file   Highest education level: Not on file  Occupational History   Not on file  Tobacco Use   Smoking status: Every Day    Packs/day: .5    Types: Cigarettes   Smokeless tobacco: Never  Vaping Use   Vaping Use: Never used  Substance and Sexual Activity   Alcohol use: Not Currently   Drug use: Yes    Types: IV, Methamphetamines   Sexual activity: Not Currently  Other Topics Concern   Not on file  Social History Narrative   ** Merged History Encounter **       Lives with a friend   Lost house and custody of son 9broke up with fiancee)   Corporate investment banker   Social Determinants of Health   Financial Resource Strain: Not on file  Food Insecurity: Food Insecurity Present (04/17/2023)   Hunger Vital Sign    Worried About Running Out of Food in the Last Year: Sometimes true    Ran Out of Food in the Last Year: Sometimes true  Transportation Needs: No Transportation Needs (04/17/2023)   PRAPARE - Administrator, Civil Service (Medical): No    Lack of Transportation (Non-Medical): No  Physical Activity: Not on file  Stress: Not on file  Social Connections: Not on file   Sleep: Good  Appetite:  Poor  Current Medications: Current Facility-Administered Medications  Medication Dose Route Frequency Provider Last Rate Last Admin   acetaminophen (TYLENOL) tablet 650 mg  650 mg Oral Q6H PRN White, Patrice L, NP       alum & mag hydroxide-simeth (MAALOX/MYLANTA) 200-200-20 MG/5ML suspension 30 mL  30 mL Oral Q4H PRN White, Patrice L, NP       atorvastatin (LIPITOR) tablet 40 mg  40 mg Oral Daily Starleen Blue, NP   40 mg at 04/19/23 0759   buPROPion (WELLBUTRIN XL) 24 hr tablet 150 mg  150 mg Oral Daily Starleen Blue, NP   150 mg at 04/19/23 0759   cephALEXin (KEFLEX) capsule 500 mg  500 mg Oral Q6H White, Patrice L, NP   500 mg at 04/19/23 1311   cloNIDine (CATAPRES) tablet 0.1 mg  0.1 mg Oral BH-qamhs White, Patrice L, NP       Followed by   Melene Muller ON 04/22/2023] cloNIDine (CATAPRES) tablet 0.1 mg  0.1 mg Oral QAC breakfast White, Patrice L, NP       dicyclomine (BENTYL) tablet 20 mg  20 mg Oral Q6H PRN White, Patrice L, NP   20 mg at 04/18/23 2107   diphenhydrAMINE (BENADRYL) capsule 50 mg  50 mg Oral TID PRN White, Patrice L, NP       Or   diphenhydrAMINE (BENADRYL) injection 50 mg  50 mg Intramuscular  TID PRN Layla Barter, NP       doxycycline (VIBRA-TABS) tablet 100 mg  100 mg Oral BID White, Patrice L, NP   100 mg at 04/19/23 0759   gabapentin (NEURONTIN) capsule 200 mg  200 mg Oral TID Starleen Blue, NP   200 mg at 04/19/23 1311   haloperidol (HALDOL) tablet 5 mg  5 mg Oral TID PRN Liborio Nixon L, NP       Or   haloperidol lactate (HALDOL) injection 5 mg  5 mg Intramuscular TID PRN Liborio Nixon L, NP       hydrOXYzine (ATARAX) tablet 25 mg  25 mg Oral TID PRN Liborio Nixon L, NP   25 mg at 04/18/23 2106   loperamide (IMODIUM) capsule 2-4 mg  2-4 mg Oral PRN White, Patrice L, NP       magnesium hydroxide (MILK OF MAGNESIA) suspension 30 mL  30 mL Oral Daily PRN White, Patrice L, NP       methocarbamol (ROBAXIN) tablet 500 mg  500 mg Oral TID Starleen Blue, NP       naproxen (NAPROSYN) tablet 500 mg  500 mg Oral BID WC Marquell Saenz, NP       ondansetron (ZOFRAN-ODT) disintegrating tablet 4 mg  4 mg Oral Q6H PRN White, Patrice L, NP   4 mg at 04/17/23 1612   QUEtiapine (SEROQUEL) tablet 200 mg  200 mg Oral QHS Dezerae Freiberger, NP   200 mg at 04/18/23 2107   traZODone (DESYREL) tablet 50 mg  50 mg Oral QHS PRN White, Patrice L, NP   50 mg at 04/18/23 2107    Lab Results:  Results for orders placed or performed during the hospital encounter of 04/17/23 (from the past 48 hour(s))  TSH      Status: None   Collection Time: 04/19/23  6:30 AM  Result Value Ref Range   TSH 4.408 0.350 - 4.500 uIU/mL    Comment: Performed by a 3rd Generation assay with a functional sensitivity of <=0.01 uIU/mL. Performed at [facility], [address], [place], [place] [address]   Lipid panel     Status: None   Collection Time: 04/19/23  6:30 AM  Result Value Ref Range   Cholesterol 120 0 - 200 mg/dL   Triglycerides 604 <540 mg/dL   HDL 45 >98 mg/dL   Total CHOL/HDL Ratio 2.7 RATIO   VLDL 21 0 - 40 mg/dL   LDL Cholesterol 54 0 - 99 mg/dL    Comment:        Total Cholesterol/HDL:CHD Risk Coronary Heart Disease Risk Table                     Men   Women  1/2 Average Risk   3.4   3.3  Average Risk       5.0   4.4  2 X Average Risk   9.6   7.1  3 X Average Risk  23.4   11.0        Use the calculated Patient Ratio above and the CHD Risk Table to determine the patient's CHD Risk.        ATP III CLASSIFICATION (LDL):  <100     mg/dL   Optimal  119-147  mg/dL   Near or Above                    Optimal  130-159  mg/dL   Borderline  829-562  mg/dL   High  >782     mg/dL   Very High Performed at [facility], [address], [place], [place] [address]   Hemoglobin A1c     Status: Abnormal   Collection Time: 04/19/23  6:30 AM  Result Value Ref Range   Hgb A1c MFr Bld 5.7 (H) 4.8 - 5.6 %    Comment: (NOTE) Pre diabetes:          5.7%-6.4%  Diabetes:              >6.4%  Glycemic control for   <7.0% adults with diabetes    Mean Plasma Glucose 116.89 mg/dL    Comment: Performed at [facility], [address], [place], [place] [address]  VITAMIN D 25 Hydroxy (Vit-D Deficiency, Fractures)     Status: None   Collection Time: 04/19/23  6:30 AM  Result Value Ref Range   Vit D, 25-Hydroxy 32.75 30 - 100 ng/mL    Comment: (NOTE) Vitamin D deficiency has been defined by the Institute of Medicine  and an Endocrine Society practice guideline  as a level of serum 25-OH  vitamin D less than 20 ng/mL (1,2). The Endocrine Society went on to  further define vitamin D insufficiency as a level between 21 and 29  ng/mL (2).  1. IOM (Institute of Medicine). 2010. Dietary reference intakes for  calcium and D. [place]: [employer]. 2. Holick MF, Binkley [place], Bischoff-Ferrari HA, et al. Evaluation,  treatment, and prevention of vitamin D deficiency: an Endocrine  Society clinical practice guideline, JCEM. 2011 Jul; 96(7): 1911-30.  Performed at [facility], [address], [place], [place] [address]   Hepatitis panel, acute     Status: None (Preliminary result)   Collection Time: 04/19/23  6:30 AM  Result Value Ref Range   Hepatitis B Surface Ag NON REACTIVE NON REACTIVE   HCV Ab PENDING NON REACTIVE   Hep A IgM NON REACTIVE NON REACTIVE   Hep B C IgM NON REACTIVE NON REACTIVE    Comment: Performed at [facility], [address], [place], [place] [address]    Blood Alcohol level:  Lab Results  Component Value Date   ETH <10 04/14/2023   ETH <10 04/11/2023    Metabolic Disorder Labs: Lab Results  Component Value Date   HGBA1C 5.7 (H) 04/19/2023   MPG 116.89 04/19/2023   MPG 116.89 08/09/2022   No results found for: "PROLACTIN" Lab Results  Component Value Date   CHOL 120 04/19/2023   TRIG 105 04/19/2023   HDL 45 04/19/2023   CHOLHDL 2.7 04/19/2023   VLDL 21 04/19/2023   LDLCALC 54 04/19/2023   LDLCALC 71 08/09/2022    Physical Findings: AIMS:  , ,  ,  ,    CIWA:    COWS:  COWS Total Score: 3  Musculoskeletal: Strength & Muscle Tone: within normal limits Gait & Station: normal Patient leans: N/A  Psychiatric Specialty Exam:  Presentation  General Appearance:  Appropriate for Environment; Fairly Groomed  Eye Contact: Fair  Speech: Clear and Coherent  Speech Volume: Normal  Handedness: Right   Mood and Affect  Mood: Depressed;  Anxious  Affect: Congruent   Thought Process  Thought Processes: Coherent  Descriptions of Associations:Intact  Orientation:Full (Time, Place and Person)  Thought Content:Logical  History of Schizophrenia/Schizoaffective disorder:No  Duration of Psychotic Symptoms:No data recorded Hallucinations:Hallucinations: None  Ideas of Reference:None  Suicidal Thoughts:Suicidal Thoughts:  No  Homicidal Thoughts:Homicidal Thoughts: No   Sensorium  Memory: Immediate Good  Judgment: Fair  Insight: Fair   Chartered certified accountant: Fair  Attention Span: Fair  Recall: [employer] of Knowledge: Fair  Language: Fair   Psychomotor Activity  Psychomotor Activity: Psychomotor Activity: Normal   Assets  Assets: Manufacturing systems engineer; Resilience   Sleep  Sleep: Sleep: Good    Physical Exam: Physical Exam Constitutional:      Appearance: Normal appearance.  Eyes:     Pupils: Pupils are equal, round, and reactive to light.  Musculoskeletal:     Cervical back: Normal range of motion.  Neurological:     Mental Status: He is alert and oriented to person, place, and time.    Review of Systems  Constitutional:  Negative for fever.  HENT:  Negative for hearing loss.   Eyes:  Negative for blurred vision.  Respiratory:  Negative for cough.   Cardiovascular:  Negative for chest pain.  Gastrointestinal:  Negative for heartburn.  Genitourinary:  Negative for dysuria.  Musculoskeletal:  Negative for myalgias.  Skin:  Negative for rash.  Neurological:  Negative for dizziness.  Psychiatric/Behavioral:  Positive for depression and substance abuse. Negative for hallucinations, memory loss and suicidal ideas. The patient is nervous/anxious and has insomnia.    Blood pressure 101/60, pulse (!) 53, temperature 97.8 F (36.6 C), temperature source Oral, resp. rate 16, height 5\' 8"  (1.727 m), weight 68 kg, SpO2 100 %. Body mass index is 22.81  kg/m.  Treatment Plan Summary: Daily contact with patient to assess and evaluate symptoms and progress in treatment and Medication management   Safety and Monitoring: Voluntary admission to inpatient psychiatric unit for safety, stabilization and treatment Daily contact with patient to assess and evaluate symptoms and progress in treatment Patient's case to be discussed in multi-disciplinary team meeting Observation Level : q15 minute checks Vital signs: q12 hours Precautions: Safety   Long Term Goal(s): Improvement in symptoms so as ready for discharge   Short Term Goals: Ability to identify changes in lifestyle to reduce recurrence of condition will improve, Ability to verbalize feelings will improve, Ability to disclose and discuss suicidal ideas, Ability to demonstrate self-control will improve, Ability to identify and develop effective coping behaviors will improve, Ability to maintain clinical measurements within normal limits will improve, Compliance with prescribed medications will improve, and Ability to identify triggers associated with substance abuse/mental health issues will improve   Diagnoses Principal Problem:   Bipolar 2 disorder, major depressive episode (HCC) Active Problems:   Opioid use disorder   Methamphetamine abuse (HCC)   Opioid use with withdrawal (HCC)   GAD (generalized anxiety disorder)   Pure hypercholesterolemia   Medications -Continue Wellbutrin 150 mg daily for depressive symptoms -Continue gabapentin 200 mg 3 times daily for anxiety -Continue Seroquel and increase to 200 mg nightly for sleep and mood stabilization -Continue Clonidine taper for opioid detox as per the [facility] -Continue COWS protocol   Medications for medical reasons -Continue Keflex 500 mg every 6 hours for cellulitis to the right ankle (redness and induration noted to right ankle area, and pt denies injecting any substances into this area.) -Continue doxycycline 100 mg twice daily  for cellulitis to the right ankle    PRNS -Start Naproxen 500 mg BID for muscle pain -Continue Robaxin 500 mg every 8 hours and change from PRN to scheduled -Continue trazodone 50 mg nightly as needed for sleep -Continue hydroxyzine 25 mg as needed for anxiety 3  times daily -Continued Bentyl 20 mg every 6 hours as needed for abdominal pain and cramping -Continue Tylenol 650 mg every 6 hours PRN for mild pain -Continue Maalox 30 mg every 4 hrs PRN for indigestion -Continue Milk of Magnesia as needed every 6 hrs for constipation   Labs reviewed: Acute hepatitis panel negative.  Was checked due to elevated LFTs.  Other labs within normal limits.  Discharge Planning: Social work and case management to assist with discharge planning and identification of hospital follow-up needs prior to discharge Estimated LOS: 5-7 days Discharge Concerns: Need to establish a safety plan; Medication compliance and effectiveness Discharge Goals: Return home with outpatient referrals for mental health follow-up including medication management/psychotherapy   I certify that inpatient services furnished can reasonably be expected to improve the patient's condition.     Starleen Blue, NP  Starleen Blue, NP 04/19/2023, 2:21 PM

## 2023-04-19 NOTE — Progress Notes (Signed)
   04/19/23 2324  Psych Admission Type (Psych Patients Only)  Admission Status Voluntary  Psychosocial Assessment  Patient Complaints Substance abuse  Eye Contact Brief  Facial Expression Anxious;Animated  Affect Appropriate to circumstance  Furniture conservator/restorer;Restless  Appearance/Hygiene Unremarkable  Behavior Characteristics Appropriate to situation  Mood Anxious;Irritable  Thought Process  Coherency WDL  Content WDL  Delusions None reported or observed  Perception WDL  Hallucination None reported or observed  Judgment Impaired  Confusion None  Danger to Self  Current suicidal ideation? Denies  Self-Injurious Behavior No self-injurious ideation or behavior indicators observed or expressed   Agreement Not to Harm Self Yes  Description of Agreement verbal  Danger to Others  Danger to Others None reported or observed

## 2023-04-19 NOTE — Progress Notes (Signed)
Adult Psychoeducational Group Note  Date:  04/19/2023 Time:  10:25 PM  Group Topic/Focus:  Wrap-Up Group:   The focus of this group is to help patients review their daily goal of treatment and discuss progress on daily workbooks.  Participation Level:  Active  Participation Quality:  Appropriate  Affect:  Appropriate  Cognitive:  Appropriate  Insight: Appropriate  Engagement in Group:  Engaged  Modes of Intervention:  Discussion  Additional Comments:  Pst. Stated day was 8 out of 10.  Joselyn Arrow 04/19/2023, 10:25 PM

## 2023-04-19 NOTE — Progress Notes (Addendum)
D. Pt presents less irritable today-continues to complain of some withdrawal symptoms (muscle aches, restlessness, anxiety), but improved from yesterday. Pt has been visible in the milieu, observed attending groups, but minimal participation. Pt currently denies SI/HI and AVH  A. Labs and vitals monitored. Pt given and educated on medications. Pt supported emotionally and encouraged to express concerns and ask questions.   R. Pt remains safe with 15 minute checks. Will continue POC.    04/19/23 0900  Psych Admission Type (Psych Patients Only)  Admission Status Voluntary  Psychosocial Assessment  Patient Complaints Substance abuse  Eye Contact Brief  Facial Expression Anxious;Pained;Sad  Affect Appropriate to circumstance  Speech Logical/coherent  Interaction Assertive  Motor Activity Fidgety  Appearance/Hygiene Unremarkable  Behavior Characteristics Appropriate to situation;Anxious;Fidgety  Mood Anxious;Irritable  Aggressive Behavior  Effect No apparent injury  Thought Process  Coherency WDL  Content Blaming others  Delusions None reported or observed  Perception WDL  Hallucination None reported or observed  Judgment Impaired  Confusion None  Danger to Self  Current suicidal ideation? Denies  Self-Injurious Behavior No self-injurious ideation or behavior indicators observed or expressed   Agreement Not to Harm Self Yes  Description of Agreement verbal contract for safety  Danger to Others  Danger to Others None reported or observed

## 2023-04-19 NOTE — Group Note (Signed)
Date:  04/19/2023 Time:  4:50 PM  Group Topic/Focus:  Wellness Toolbox:   The focus of this group is to discuss various aspects of wellness, balancing those aspects and exploring ways to increase the ability to experience wellness.  Patients will create a wellness toolbox for use upon discharge.    Participation Level:  Did Not Attend  Participation Quality:    Affect:      Cognitive:      Insight: None  Engagement in Group:    Modes of Intervention:      Additional Comments:     Reymundo Poll 04/19/2023, 4:50 PM

## 2023-04-20 ENCOUNTER — Encounter (HOSPITAL_COMMUNITY): Payer: Self-pay

## 2023-04-20 DIAGNOSIS — F3181 Bipolar II disorder: Secondary | ICD-10-CM | POA: Diagnosis not present

## 2023-04-20 LAB — CULTURE, BLOOD (ROUTINE X 2)
Culture: NO GROWTH
Culture: NO GROWTH

## 2023-04-20 NOTE — Progress Notes (Signed)
Patient stated he is supposed to take wellbutrin 300 mg daily.  Presently taking 150 mg.  Will discuss meds with MD.

## 2023-04-20 NOTE — Group Note (Signed)
Date:  04/20/2023 Time:  2:58 PM  Group Topic/Focus:  Coping With Mental Health Crisis:   The purpose of this group is to help patients identify strategies for coping with mental health crisis.  Group discusses possible causes of crisis and ways to manage them effectively.    Participation Level:  Did Not Attend  Participation Quality:    Affect:      Cognitive:      Insight: None  Engagement in Group:    Modes of Intervention:      Additional Comments:     Reymundo Poll 04/20/2023, 2:58 PM

## 2023-04-20 NOTE — Progress Notes (Signed)
D:  Patient has been in bed most of the day.  Respirations even and unlabored.  No signs of distress noted on patient's face/body movements.   A:  Medications administered per MD orders.  Emotional support and encouragements given patient. R:  Safety maintained with 15 minute checks.

## 2023-04-20 NOTE — Group Note (Signed)
Recreation Therapy Group Note   Group Topic:Problem Solving  Group Date: 04/20/2023 Start Time: 0930 End Time: 1000 Facilitators: Tom Macpherson-McCall, LRT,CTRS Location: [address] Dayroom   Goal Area(s) Addresses:  Patient will effectively work with peer towards shared goal.  Patient will identify skills used to make activity successful.  Patient will share challenges and verbalize solution-driven approaches used. Patient will identify how skills used during activity can be used to reach post d/c goals.   Group Description: [employer]. Patients were provided the following materials: 4 drinking straws, 5 rubber bands, 5 paper clips, 2 index cards and 2 drinking cups. Using the provided materials patients were asked to build a launching mechanism to launch a ping pong ball across the room, approximately 10 feet. Patients were divided into teams of 3-5. Instructions required all materials be incorporated into the device, functionality of items left to the peer group's discretion.   Affect/Mood: N/A   Participation Level: Did not attend    Clinical Observations/Individualized Feedback:     Plan: Continue to engage patient in RT group sessions 2-3x/week.   Rozelia Catapano-McCall, LRT,CTRS 04/20/2023 12:02 PM

## 2023-04-20 NOTE — Progress Notes (Signed)
   04/20/23 2321  Psych Admission Type (Psych Patients Only)  Admission Status Voluntary  Psychosocial Assessment  Patient Complaints Substance abuse  Eye Contact Fair  Facial Expression Anxious  Affect Anxious  Speech Logical/coherent  Interaction Assertive  Motor Activity Fidgety  Behavior Characteristics Cooperative  Mood Anxious  Thought Process  Coherency WDL  Content WDL  Delusions WDL  Perception WDL  Hallucination None reported or observed  Judgment Impaired  Confusion None  Danger to Self  Current suicidal ideation? Denies  Self-Injurious Behavior No self-injurious ideation or behavior indicators observed or expressed   Agreement Not to Harm Self Yes  Description of Agreement verbal contract for safety  Danger to Others  Danger to Others None reported or observed   D: Patient appears anxious but cooperative. in dayroom reports she had a good day and is able to engage therapeutically. A: Medications administered as prescribed. Support and encouragement provided as needed.  R: Patient remains safe on the unit. Plan of care ongoing for safety and stability.

## 2023-04-20 NOTE — Plan of Care (Signed)
Nurse discussed anxiety, depression and coping skills with patient.  

## 2023-04-20 NOTE — Group Note (Signed)
Date:  04/20/2023 Time:  2:55 PM  Group Topic/Focus:  Goals Group:   The focus of this group is to help patients establish daily goals to achieve during treatment and discuss how the patient can incorporate goal setting into their daily lives to aide in recovery.    Participation Level:  Did Not Attend  Participation Quality:      Affect:      Cognitive:      Insight: None  Engagement in Group:    Modes of Intervention:      Additional Comments:     Reymundo Poll 04/20/2023, 2:55 PM

## 2023-04-20 NOTE — Progress Notes (Signed)
[facility] MD Progress Note  04/20/2023 12:49 PM Brian [name]  MRN:  [id number] Principal Problem: Bipolar 2 disorder, major depressive episode (HCC) Diagnosis: Principal Problem:   Bipolar 2 disorder, major depressive episode (HCC) Active Problems:   Opioid use disorder   Methamphetamine abuse (HCC)   Opioid use with withdrawal (HCC)   GAD (generalized anxiety disorder)   Pure hypercholesterolemia  Reason for Admission: Brian [name] is a 37 yo CM with a prior mental health history of ADHD, bipolar 2 disorder, PTSD, anxiety, who presented to the [facility] on 06/21 with complaints of worsening depressive symptoms and SI with a plan to overdose on fentanyl in the context of his recent relapse.  Patient reported being kicked out from an [place] house where he was staying due to his relapse and is now homeless.  He was transferred voluntarily to this behavioral health Hospital for treatment and stabilization of his mental status on 06/21.    Daily notes: Brian [name] is seen, chart reviewed. The chart findings discussed with the treatment team. He presents alert, oriented & aware of situation. He is making a good eye contact & verbally responsive. He is visible on the unit, attending group sessions. He reports, "I feel like crap today. I really can't tell you how I'm doing because I'm still cramping in my stomach, but I'm not having any diarrhea. My whole body hurts. I have been here for a couple of days because I attempted to hurt myself by shooting-up fentanyl that I have not done or used in 10 years. I lost my house/home to addiction. I'm currently homeless as a result, going on two weeks. I would like to go back to a treatment program after discharge from here.  I [facility] a program [facility] with [facility]. It was a good program that helped me in the past. I would like to go back to that program. What ever I'm going through now is my own damn fault. I slept well  last night, but I'm still irritated & depressed. My depression today is #8 & anxiety #8". Brian [name] however denies any SIHI, AVH, delusional thoughts or paranoia. He does not appear to be responding to any internal stimuli. Brian [name] is attending group sessions. After this follow-up evaluation. Brian [name] was observed go back to his room & slammed the door. He currently has a no roommate order. There are no changes made on his current plan of care. Will continue as already in progress. Reviewed vital signs, stable.  Per previous  report: Patient's treatment team to revisit discharge planning on an ongoing basis, patient continues to be interested in substance abuse rehab.  Educated that CSW will explore this for him. Fluids being encouraged for low HR, pt is asymptomatic.   Total Time spent with patient: 45 minutes  Past Psychiatric History: See H & P  Past Medical History:  Past Medical History:  Diagnosis Date   Alcohol abuse    Anxiety    Bipolar 2 disorder (HCC)    Cocaine abuse (HCC)    Depression    Kidney stones    Methamphetamine abuse, episodic (HCC)    Myocardial infarction (HCC)    Narcotic abuse (HCC)    Peptic ulcer    Polysubstance abuse (HCC)    Renal disorder    kidney stones    Past Surgical History:  Procedure Laterality Date   ESOPHAGOGASTRODUODENOSCOPY N/A 05/29/2014   Procedure: ESOPHAGOGASTRODUODENOSCOPY (EGD) ;  Surgeon: Theda Belfast, MD;  Location: [facility] ENDOSCOPY;  Service: Endoscopy;  Laterality: N/A;  check with Dr. Loreta Ave about sedation type/timinng - I recommend MAC   HAND SURGERY  2006   LEFT HEART CATH AND CORONARY ANGIOGRAPHY N/A 06/21/2020   Procedure: LEFT HEART CATH AND CORONARY ANGIOGRAPHY;  Surgeon: Yvonne Kendall, MD;  Location: [facility] INVASIVE CV LAB;  Service: Cardiovascular;  Laterality: N/A;   Family History:  Family History  Problem Relation Age of Onset   Heart disease Father    Family Psychiatric  History: See H & P Social History:  Social History    Substance and Sexual Activity  Alcohol Use Not Currently     Social History   Substance and Sexual Activity  Drug Use Yes   Types: IV, Methamphetamines    Social History   Socioeconomic History   Marital status: Single    Spouse name: Not on file   Number of children: Not on file   Years of education: Not on file   Highest education level: Not on file  Occupational History   Not on file  Tobacco Use   Smoking status: Every Day    Packs/day: .5    Types: Cigarettes   Smokeless tobacco: Never  Vaping Use   Vaping Use: Never used  Substance and Sexual Activity   Alcohol use: Not Currently   Drug use: Yes    Types: IV, Methamphetamines   Sexual activity: Not Currently  Other Topics Concern   Not on file  Social History Narrative   ** Merged History Encounter **       Lives with a friend   Lost house and custody of son 9broke up with fiancee)   Corporate investment banker   Social Determinants of Health   Financial Resource Strain: Not on file  Food Insecurity: Food Insecurity Present (04/17/2023)   Hunger Vital Sign    Worried About Running Out of Food in the Last Year: Sometimes true    Ran Out of Food in the Last Year: Sometimes true  Transportation Needs: No Transportation Needs (04/17/2023)   PRAPARE - Administrator, Civil Service (Medical): No    Lack of Transportation (Non-Medical): No  Physical Activity: Not on file  Stress: Not on file  Social Connections: Not on file   Sleep: Good  Appetite:  Poor  Current Medications: Current Facility-Administered Medications  Medication Dose Route Frequency Provider Last Rate Last Admin   acetaminophen (TYLENOL) tablet 650 mg  650 mg Oral Q6H PRN White, Patrice L, NP       alum & mag hydroxide-simeth (MAALOX/MYLANTA) 200-200-20 MG/5ML suspension 30 mL  30 mL Oral Q4H PRN White, Patrice L, NP       atorvastatin (LIPITOR) tablet 40 mg  40 mg Oral Daily Nkwenti, Doris, NP   40 mg at 04/20/23 0729   buPROPion  (WELLBUTRIN XL) 24 hr tablet 150 mg  150 mg Oral Daily Nkwenti, Doris, NP   150 mg at 04/20/23 0728   cephALEXin (KEFLEX) capsule 500 mg  500 mg Oral Q6H White, Patrice L, NP   500 mg at 04/20/23 0616   cloNIDine (CATAPRES) tablet 0.1 mg  0.1 mg Oral BH-qamhs White, Patrice L, NP   0.1 mg at 04/20/23 5284   Followed by   Melene Muller ON 04/22/2023] cloNIDine (CATAPRES) tablet 0.1 mg  0.1 mg Oral QAC breakfast White, Patrice L, NP       dicyclomine (BENTYL) tablet 20 mg  20 mg Oral Q6H PRN White, Patrice L, NP  20 mg at 04/19/23 2048   diphenhydrAMINE (BENADRYL) capsule 50 mg  50 mg Oral TID PRN White, Patrice L, NP       Or   diphenhydrAMINE (BENADRYL) injection 50 mg  50 mg Intramuscular TID PRN White, Patrice L, NP       doxycycline (VIBRA-TABS) tablet 100 mg  100 mg Oral BID White, Patrice L, NP   100 mg at 04/20/23 7829   gabapentin (NEURONTIN) capsule 200 mg  200 mg Oral TID Starleen Blue, NP   200 mg at 04/20/23 5621   haloperidol (HALDOL) tablet 5 mg  5 mg Oral TID PRN Liborio Nixon L, NP       Or   haloperidol lactate (HALDOL) injection 5 mg  5 mg Intramuscular TID PRN Liborio Nixon L, NP       hydrOXYzine (ATARAX) tablet 25 mg  25 mg Oral TID PRN White, Patrice L, NP   25 mg at 04/19/23 2048   loperamide (IMODIUM) capsule 2-4 mg  2-4 mg Oral PRN White, Patrice L, NP       magnesium hydroxide (MILK OF MAGNESIA) suspension 30 mL  30 mL Oral Daily PRN White, Patrice L, NP       methocarbamol (ROBAXIN) tablet 500 mg  500 mg Oral TID Starleen Blue, NP   500 mg at 04/20/23 0730   naproxen (NAPROSYN) tablet 500 mg  500 mg Oral BID WC Nkwenti, Doris, NP   500 mg at 04/20/23 0728   ondansetron (ZOFRAN-ODT) disintegrating tablet 4 mg  4 mg Oral Q6H PRN White, Patrice L, NP   4 mg at 04/17/23 1612   QUEtiapine (SEROQUEL) tablet 200 mg  200 mg Oral QHS Nkwenti, Doris, NP   200 mg at 04/19/23 2048   traZODone (DESYREL) tablet 50 mg  50 mg Oral QHS PRN White, Patrice L, NP   50 mg at 04/19/23 2048     Lab Results:  Results for orders placed or performed during the hospital encounter of 04/17/23 (from the past 48 hour(s))  TSH     Status: None   Collection Time: 04/19/23  6:30 AM  Result Value Ref Range   TSH 4.408 0.350 - 4.500 uIU/mL    Comment: Performed by a 3rd Generation assay with a functional sensitivity of <=0.01 uIU/mL. Performed at [facility], [address], [place], [place] [address]   Lipid panel     Status: None   Collection Time: 04/19/23  6:30 AM  Result Value Ref Range   Cholesterol 120 0 - 200 mg/dL   Triglycerides 784 <696 mg/dL   HDL 45 >29 mg/dL   Total CHOL/HDL Ratio 2.7 RATIO   VLDL 21 0 - 40 mg/dL   LDL Cholesterol 54 0 - 99 mg/dL    Comment:        Total Cholesterol/HDL:CHD Risk Coronary Heart Disease Risk Table                     Men   Women  1/2 Average Risk   3.4   3.3  Average Risk       5.0   4.4  2 X Average Risk   9.6   7.1  3 X Average Risk  23.4   11.0        Use the calculated Patient Ratio above and the CHD Risk Table to determine the patient's CHD Risk.        ATP III CLASSIFICATION (LDL):  <100  mg/dL   Optimal  130-865  mg/dL   Near or Above                    Optimal  130-159  mg/dL   Borderline  784-696  mg/dL   High  >295     mg/dL   Very High Performed at [facility], [address] [place], [place] [address]   Hemoglobin A1c     Status: Abnormal   Collection Time: 04/19/23  6:30 AM  Result Value Ref Range   Hgb A1c MFr Bld 5.7 (H) 4.8 - 5.6 %    Comment: (NOTE) Pre diabetes:          5.7%-6.4%  Diabetes:              >6.4%  Glycemic control for   <7.0% adults with diabetes    Mean Plasma Glucose 116.89 mg/dL    Comment: Performed at [facility], [address], [place], [place] [address]  VITAMIN D 25 Hydroxy (Vit-D Deficiency, Fractures)     Status: None   Collection Time: 04/19/23  6:30 AM  Result Value Ref Range   Vit D, 25-Hydroxy 32.75 30 - 100 ng/mL     Comment: (NOTE) Vitamin D deficiency has been defined by the Institute of Medicine  and an Endocrine Society practice guideline as a level of serum 25-OH  vitamin D less than 20 ng/mL (1,2). The Endocrine Society went on to  further define vitamin D insufficiency as a level between 21 and 29  ng/mL (2).  1. IOM (Institute of Medicine). 2010. Dietary reference intakes for  calcium and D. [place]: [employer]. 2. Holick MF, Binkley Manson, Bischoff-Ferrari HA, et al. Evaluation,  treatment, and prevention of vitamin D deficiency: an Endocrine  Society clinical practice guideline, JCEM. 2011 Jul; 96(7): 1911-30.  Performed at [facility], [address], [place], [place] [address]   Hepatitis panel, acute     Status: Abnormal   Collection Time: 04/19/23  6:30 AM  Result Value Ref Range   Hepatitis B Surface Ag NON REACTIVE NON REACTIVE   HCV Ab Reactive (A) NON REACTIVE    Comment: (NOTE) The CDC recommends that a Reactive HCV antibody result be followed up  with a HCV Nucleic Acid Amplification test.     Hep A IgM NON REACTIVE NON REACTIVE   Hep B C IgM NON REACTIVE NON REACTIVE    Comment: Performed at [facility], [address], [place], [place] [address]   Blood Alcohol level:  Lab Results  Component Value Date   ETH <10 04/14/2023   ETH <10 04/11/2023   Metabolic Disorder Labs: Lab Results  Component Value Date   HGBA1C 5.7 (H) 04/19/2023   MPG 116.89 04/19/2023   MPG 116.89 08/09/2022   No results found for: "PROLACTIN" Lab Results  Component Value Date   CHOL 120 04/19/2023   TRIG 105 04/19/2023   HDL 45 04/19/2023   CHOLHDL 2.7 04/19/2023   VLDL 21 04/19/2023   LDLCALC 54 04/19/2023   LDLCALC 71 08/09/2022    Physical Findings: AIMS:  , ,  ,  ,    CIWA:    COWS:  COWS Total Score: 1  Musculoskeletal: Strength & Muscle Tone: within normal limits Gait & Station: normal Patient leans: N/A  Psychiatric Specialty  Exam:  Presentation  General Appearance:  Appropriate for Environment; Fairly Groomed  Eye Contact: Fair  Speech: Clear and Coherent  Speech Volume: Normal  Handedness: Right   Mood and Affect  Mood: Depressed; Anxious  Affect: Congruent   Thought Process  Thought Processes: Coherent  Descriptions of Associations:Intact  Orientation:Full (Time, Place and Person)  Thought Content:Logical  History of Schizophrenia/Schizoaffective disorder:No  Duration of Psychotic Symptoms:No data recorded Hallucinations:Hallucinations: None  Ideas of Reference:None  Suicidal Thoughts:Suicidal Thoughts: No  Homicidal Thoughts:Homicidal Thoughts: No   Sensorium  Memory: Immediate Good  Judgment: Fair  Insight: Fair   Art therapist  Concentration: Fair  Attention Span: Fair  Recall: Fair  Fund of Knowledge: Fair  Language: Fair   Psychomotor Activity  Psychomotor Activity: Psychomotor Activity: Normal   Assets  Assets: Communication Skills; Resilience   Sleep  Sleep: Sleep: Good    Physical Exam: Physical Exam Constitutional:      Appearance: Normal appearance.  Eyes:     Pupils: Pupils are equal, round, and reactive to light.  Musculoskeletal:     Cervical back: Normal range of motion.  Neurological:     Mental Status: He is alert and oriented to person, place, and time.    Review of Systems  Constitutional:  Negative for fever.  HENT:  Negative for hearing loss.   Eyes:  Negative for blurred vision.  Respiratory:  Negative for cough.   Cardiovascular:  Negative for chest pain.  Gastrointestinal:  Negative for heartburn.  Genitourinary:  Negative for dysuria.  Musculoskeletal:  Negative for myalgias.  Skin:  Negative for rash.  Neurological:  Negative for dizziness.  Psychiatric/Behavioral:  Positive for depression and substance abuse. Negative for hallucinations, memory loss and suicidal ideas. The patient is  nervous/anxious and has insomnia.    Blood pressure 116/76, pulse 60, temperature 97.8 F (36.6 C), temperature source Oral, resp. rate 16, height 5\' 8"  (1.727 m), weight 68 kg, SpO2 100 %. Body mass index is 22.81 kg/m.  Treatment Plan Summary: Daily contact with patient to assess and evaluate symptoms and progress in treatment and Medication management   Diagnoses Principal Problem:   Bipolar 2 disorder, major depressive episode (HCC) Active Problems:   Opioid use disorder   Methamphetamine abuse (HCC)   Opioid use with withdrawal (HCC)   GAD (generalized anxiety disorder)   Pure hypercholesterolemia   Medications -Continue Wellbutrin 150 mg daily for depressive symptoms -Continue gabapentin 200 mg 3 times daily for anxiety -Continue Seroquel 200 mg nightly for sleep and mood stabilization -Continue Clonidine taper for opioid detox as per the [facility] -Continue COWS protocol   Medications for medical reasons -Continue Keflex 500 mg every 6 hours for cellulitis to the right ankle (redness and induration noted to right ankle area, and pt denies injecting any substances into this area.) -Continue doxycycline 100 mg twice daily for cellulitis to the right ankle    PRNS -Continue Naproxen 500 mg BID for muscle pain -Continue Robaxin 500 mg every 8 hours and change from PRN to scheduled -Continue trazodone 50 mg nightly as needed for sleep -Continue hydroxyzine 25 mg as needed for anxiety 3 times daily -Continued Bentyl 20 mg every 6 hours as needed for abdominal pain and cramping -Continue Tylenol 650 mg every 6 hours PRN for mild pain -Continue Maalox 30 mg every 4 hrs PRN for indigestion -Continue Milk of Magnesia as needed every 6 hrs for constipation   Labs reviewed: Acute hepatitis panel negative.  Was checked due to elevated LFTs.  Other labs within normal limits.  Discharge Planning: Social work and case management to  assist with discharge planning and identification of  hospital follow-up needs prior to discharge Estimated LOS: 5-7 days Discharge Concerns: Need to establish a safety plan; Medication compliance and effectiveness Discharge Goals: Return home with outpatient referrals for mental health follow-up including medication management/psychotherapy   I certify that inpatient services furnished can reasonably be expected to improve the patient's condition.    Armandina Stammer, NP, pmhnp, fnp-bc. 04/20/2023, 12:49 PMPatient ID: Teola Bradley, male   DOB: December 09, 1985, 37 y.o.   MRN: [id number]

## 2023-04-20 NOTE — BH IP Treatment Plan (Signed)
Interdisciplinary Treatment and Diagnostic Plan Update  04/20/2023 Time of Session: 11:20 AM  Brian [name] MRN: [id number]  Principal Diagnosis: Bipolar 2 disorder, major depressive episode (HCC)  Secondary Diagnoses: Principal Problem:   Bipolar 2 disorder, major depressive episode (HCC) Active Problems:   Opioid use disorder   Methamphetamine abuse (HCC)   Opioid use with withdrawal (HCC)   GAD (generalized anxiety disorder)   Pure hypercholesterolemia   Current Medications:  Current Facility-Administered Medications  Medication Dose Route Frequency Provider Last Rate Last Admin   acetaminophen (TYLENOL) tablet 650 mg  650 mg Oral Q6H PRN White, Patrice L, NP       alum & mag hydroxide-simeth (MAALOX/MYLANTA) 200-200-20 MG/5ML suspension 30 mL  30 mL Oral Q4H PRN White, Patrice L, NP       atorvastatin (LIPITOR) tablet 40 mg  40 mg Oral Daily Starleen Blue, NP   40 mg at 04/20/23 0729   buPROPion (WELLBUTRIN XL) 24 hr tablet 150 mg  150 mg Oral Daily Starleen Blue, NP   150 mg at 04/20/23 0728   cephALEXin (KEFLEX) capsule 500 mg  500 mg Oral Q6H White, Patrice L, NP   500 mg at 04/20/23 4098   cloNIDine (CATAPRES) tablet 0.1 mg  0.1 mg Oral BH-qamhs White, Patrice L, NP   0.1 mg at 04/20/23 1191   Followed by   Melene Muller ON 04/22/2023] cloNIDine (CATAPRES) tablet 0.1 mg  0.1 mg Oral QAC breakfast White, Patrice L, NP       dicyclomine (BENTYL) tablet 20 mg  20 mg Oral Q6H PRN White, Patrice L, NP   20 mg at 04/19/23 2048   diphenhydrAMINE (BENADRYL) capsule 50 mg  50 mg Oral TID PRN White, Patrice L, NP       Or   diphenhydrAMINE (BENADRYL) injection 50 mg  50 mg Intramuscular TID PRN White, Patrice L, NP       doxycycline (VIBRA-TABS) tablet 100 mg  100 mg Oral BID White, Patrice L, NP   100 mg at 04/20/23 4782   gabapentin (NEURONTIN) capsule 200 mg  200 mg Oral TID Starleen Blue, NP   200 mg at 04/20/23 9562   haloperidol (HALDOL) tablet 5 mg  5 mg Oral TID PRN White,  Patrice L, NP       Or   haloperidol lactate (HALDOL) injection 5 mg  5 mg Intramuscular TID PRN White, Patrice L, NP       hydrOXYzine (ATARAX) tablet 25 mg  25 mg Oral TID PRN White, Patrice L, NP   25 mg at 04/19/23 2048   loperamide (IMODIUM) capsule 2-4 mg  2-4 mg Oral PRN White, Patrice L, NP       magnesium hydroxide (MILK OF MAGNESIA) suspension 30 mL  30 mL Oral Daily PRN White, Patrice L, NP       methocarbamol (ROBAXIN) tablet 500 mg  500 mg Oral TID Starleen Blue, NP   500 mg at 04/20/23 0730   naproxen (NAPROSYN) tablet 500 mg  500 mg Oral BID WC Nkwenti, Doris, NP   500 mg at 04/20/23 0728   ondansetron (ZOFRAN-ODT) disintegrating tablet 4 mg  4 mg Oral Q6H PRN White, Patrice L, NP   4 mg at 04/17/23 1612   QUEtiapine (SEROQUEL) tablet 200 mg  200 mg Oral QHS Nkwenti, Doris, NP   200 mg at 04/19/23 2048   traZODone (DESYREL) tablet 50 mg  50 mg Oral QHS PRN White, Patrice L, NP   50 mg  at 04/19/23 2048   PTA Medications: Medications Prior to Admission  Medication Sig Dispense Refill Last Dose   atorvastatin (LIPITOR) 80 MG tablet Take 80 mg by mouth daily.      buPROPion (WELLBUTRIN XL) 300 MG 24 hr tablet Take 300 mg by mouth daily.      gabapentin (NEURONTIN) 300 MG capsule Take 300 mg by mouth 3 (three) times daily.      hydrOXYzine (ATARAX) 25 MG tablet Take 25 mg by mouth 3 (three) times daily as needed for anxiety.      isosorbide dinitrate (ISORDIL) 30 MG tablet Take 15 mg by mouth daily.      QUEtiapine (SEROQUEL) 200 MG tablet Take 200 mg by mouth at bedtime.      cephALEXin (KEFLEX) 500 MG capsule Take 1 capsule (500 mg total) by mouth 4 (four) times daily. 20 capsule 0    doxycycline (VIBRAMYCIN) 100 MG capsule Take 1 capsule (100 mg total) by mouth 2 (two) times daily. 30 capsule 0     Patient Stressors:    Patient Strengths:    Treatment Modalities: Medication Management, Group therapy, Case management,  1 to 1 session with clinician, Psychoeducation,  Recreational therapy.   Physician Treatment Plan for Primary Diagnosis: Bipolar 2 disorder, major depressive episode (HCC) Long Term Goal(s): Improvement in symptoms so as ready for discharge   Short Term Goals: Ability to identify changes in lifestyle to reduce recurrence of condition will improve Ability to verbalize feelings will improve Ability to disclose and discuss suicidal ideas Ability to demonstrate self-control will improve Ability to identify and develop effective coping behaviors will improve Ability to maintain clinical measurements within normal limits will improve Compliance with prescribed medications will improve Ability to identify triggers associated with substance abuse/mental health issues will improve  Medication Management: Evaluate patient's response, side effects, and tolerance of medication regimen.  Therapeutic Interventions: 1 to 1 sessions, Unit Group sessions and Medication administration.  Evaluation of Outcomes: Not Progressing  Physician Treatment Plan for Secondary Diagnosis: Principal Problem:   Bipolar 2 disorder, major depressive episode (HCC) Active Problems:   Opioid use disorder   Methamphetamine abuse (HCC)   Opioid use with withdrawal (HCC)   GAD (generalized anxiety disorder)   Pure hypercholesterolemia  Long Term Goal(s): Improvement in symptoms so as ready for discharge   Short Term Goals: Ability to identify changes in lifestyle to reduce recurrence of condition will improve Ability to verbalize feelings will improve Ability to disclose and discuss suicidal ideas Ability to demonstrate self-control will improve Ability to identify and develop effective coping behaviors will improve Ability to maintain clinical measurements within normal limits will improve Compliance with prescribed medications will improve Ability to identify triggers associated with substance abuse/mental health issues will improve     Medication Management:  Evaluate patient's response, side effects, and tolerance of medication regimen.  Therapeutic Interventions: 1 to 1 sessions, Unit Group sessions and Medication administration.  Evaluation of Outcomes: Not Progressing   RN Treatment Plan for Primary Diagnosis: Bipolar 2 disorder, major depressive episode (HCC) Long Term Goal(s): Knowledge of disease and therapeutic regimen to maintain health will improve  Short Term Goals: Ability to remain free from injury will improve, Ability to verbalize frustration and anger appropriately will improve, Ability to demonstrate self-control, Ability to participate in decision making will improve, Ability to verbalize feelings will improve, Ability to disclose and discuss suicidal ideas, Ability to identify and develop effective coping behaviors will improve, and Compliance with prescribed medications  will improve  Medication Management: RN will administer medications as ordered by provider, will assess and evaluate patient's response and provide education to patient for prescribed medication. RN will report any adverse and/or side effects to prescribing provider.  Therapeutic Interventions: 1 on 1 counseling sessions, Psychoeducation, Medication administration, Evaluate responses to treatment, Monitor vital signs and CBGs as ordered, Perform/monitor CIWA, COWS, AIMS and Fall Risk screenings as ordered, Perform wound care treatments as ordered.  Evaluation of Outcomes: Not Progressing   LCSW Treatment Plan for Primary Diagnosis: Bipolar 2 disorder, major depressive episode (HCC) Long Term Goal(s): Safe transition to appropriate next level of care at discharge, Engage patient in therapeutic group addressing interpersonal concerns.  Short Term Goals: Engage patient in aftercare planning with referrals and resources, Increase social support, Increase ability to appropriately verbalize feelings, Increase emotional regulation, Facilitate acceptance of mental health  diagnosis and concerns, Facilitate patient progression through stages of change regarding substance use diagnoses and concerns, Identify triggers associated with mental health/substance abuse issues, and Increase skills for wellness and recovery  Therapeutic Interventions: Assess for all discharge needs, 1 to 1 time with Social worker, Explore available resources and support systems, Assess for adequacy in community support network, Educate family and significant other(s) on suicide prevention, Complete Psychosocial Assessment, Interpersonal group therapy.  Evaluation of Outcomes: Not Progressing   Progress in Treatment: Attending groups: No. Participating in groups: No. Taking medication as prescribed: Yes. Toleration medication: Yes. Family/Significant other contact made: No, will contact:  Cassie Baker, friend/girlfriend, [phone] Patient understands diagnosis: Yes. Discussing patient identified problems/goals with staff: Yes. Medical problems stabilized or resolved: Yes. Denies suicidal/homicidal ideation: Yes. Issues/concerns per patient self-inventory: No.   New problem(s) identified: No, Describe:  None reported   New Short Term/Long Term Goal(s):detox, medication management for mood stabilization; elimination of SI thoughts; development of comprehensive mental wellness/sobriety plan    Patient Goals:  " Get stable and got to a residential "   Discharge Plan or Barriers: Patient recently admitted. CSW will continue to follow and assess for appropriate referrals and possible discharge planning.    Reason for Continuation of Hospitalization: Anxiety Depression Medication stabilization Suicidal ideation Withdrawal symptoms  Estimated Length of Stay: 3-5 days   Last 3 [place] Suicide Severity Risk Score: Flowsheet Row Ad[facility] (Current) [facility] Most recent reading at 04/17/2023  3:25 PM ED from 04/17/2023 in [facility] Most recent reading at 04/17/2023  7:58 AM ED from 04/16/2023 in [facility] at [facility] Most recent reading at 04/16/2023  2:28 PM  C-SSRS RISK CATEGORY High Risk High Risk High Risk       Last PHQ 2/9 Scores:    08/14/2022    6:59 AM 08/13/2022   12:26 PM 08/09/2022    2:00 PM  Depression screen PHQ 2/9  Decreased Interest 1 1 2   Down, Depressed, Hopeless 2 1 1   PHQ - 2 Score 3 2 3   Altered sleeping 1 1 2   Tired, decreased energy 1 1 1   Change in appetite 1 1 1   Feeling bad or failure about yourself  2 1 2   Trouble concentrating 1 1 2   Moving slowly or fidgety/restless 1 1 0  Suicidal thoughts 1 0 1  PHQ-9 Score 11 8 12   Difficult doing work/chores Very difficult Somewhat difficult Extremely dIfficult    Scribe for Treatment Team: Isabella Bowens, LCSWA 04/20/2023 1:08 PM

## 2023-04-20 NOTE — Progress Notes (Signed)
Adult Psychoeducational Group Note  Date:  04/20/2023 Time:  9:45 PM  Group Topic/Focus:  Wrap-Up Group:   The focus of this group is to help patients review their daily goal of treatment and discuss progress on daily workbooks.  Participation Level:  Did Not Attend  Participation Quality:  Appropriate  Affect:  Appropriate  Cognitive:  Appropriate  Insight: Appropriate  Engagement in Group:  None  Modes of Intervention:  Discussion  Additional Comments:  Pt did not attend the meeting.  Joselyn Arrow 04/20/2023, 9:45 PM

## 2023-04-21 DIAGNOSIS — F3181 Bipolar II disorder: Secondary | ICD-10-CM | POA: Diagnosis not present

## 2023-04-21 LAB — CULTURE, BLOOD (ROUTINE X 2): Special Requests: ADEQUATE

## 2023-04-21 MED ORDER — BUPROPION HCL ER (XL) 300 MG PO TB24
300.0000 mg | ORAL_TABLET | Freq: Every day | ORAL | Status: DC
  Administered 2023-04-22 – 2023-04-23 (×2): 300 mg via ORAL
  Filled 2023-04-21 (×3): qty 1

## 2023-04-21 MED ORDER — TRAZODONE HCL 100 MG PO TABS
100.0000 mg | ORAL_TABLET | Freq: Every evening | ORAL | Status: DC | PRN
  Administered 2023-04-21 – 2023-04-22 (×2): 100 mg via ORAL
  Filled 2023-04-21: qty 14
  Filled 2023-04-21 (×2): qty 1

## 2023-04-21 NOTE — [facility] Group Notes (Signed)
Adult Psychoeducational Group Note  Date:  04/21/2023 Time:  10:44 AM  Group Topic/Focus:  Goals Group:   The focus of this group is to help patients establish daily goals to achieve during treatment and discuss how the patient can incorporate goal setting into their daily lives to aide in recovery.  Participation Level:  Active  Participation Quality:  Appropriate  Affect:  Appropriate  Cognitive:  Appropriate  Insight: Good  Engagement in Group:  Engaged  Modes of Intervention:  Discussion  Additional Comments:    Lucilla Edin 04/21/2023, 10:44 AM

## 2023-04-21 NOTE — Progress Notes (Signed)
Adult Psychoeducational Group Note  Date:  04/21/2023 Time:  11:28 PM  Group Topic/Focus:  Wrap-Up Group:   The focus of this group is to help patients review their daily goal of treatment and discuss progress on daily workbooks.  Participation Level:  Active  Participation Quality:  Appropriate  Affect:  Appropriate  Cognitive:  Appropriate  Insight: Appropriate  Engagement in Group:  Engaged  Modes of Intervention:  Discussion  Additional Comments:  Pt stated day was 7 out of 10. Pt stated today was positive.  Joselyn Arrow 04/21/2023, 11:28 PM

## 2023-04-21 NOTE — Progress Notes (Signed)
D:  Patient's self inventory sheet, patient sleeps good, sleep medication given.  Fair appetite, low energy level, poor concentration.  Rated depression, hopeless and anxiety 7.  Denied withdrawals, then stated he has runny nose and cramping.  Denied SI.  Denied physical problems.  Denied physical pain.  Goal is physically feel better.  Denied HI.  Denied A/V hallucinations.  Contracts for safety.  Discharge plans are to go to a 30 day treatment center.. A:  Medications administered per MD orders.  Emotional support and encouragement given patient. R:  Safety maintained with 15 minute checks.

## 2023-04-21 NOTE — Progress Notes (Signed)
[facility] MD Progress Note  04/21/2023 1:55 PM Brian [name]  MRN:  [id number] Principal Problem: Bipolar 2 disorder, major depressive episode (HCC) Diagnosis: Principal Problem:   Bipolar 2 disorder, major depressive episode (HCC) Active Problems:   Opioid use disorder   Methamphetamine abuse (HCC)   Opioid use with withdrawal (HCC)   GAD (generalized anxiety disorder)   Pure hypercholesterolemia  Reason for Admission: Brian [name] is a 37 yo CM with a prior mental health history of ADHD, bipolar 2 disorder, PTSD, anxiety, who presented to the [facility] on 06/21 with complaints of worsening depressive symptoms and SI with a plan to overdose on fentanyl in the context of his recent relapse.  Patient reported being kicked out from an [place] house where he was staying due to his relapse and is now homeless.  He was transferred voluntarily to this behavioral health Hospital for treatment and stabilization of his mental status on 06/21.    24 hr chart review: Sleep Hours last night: Good as per patient Nursing Concerns: None reported Behavioral episodes in the past 24 hrs: None Medication Compliance: Compliant Vital Signs in the past 24 hrs: WNL PRN Medications in the past 24 hrs: Trazodone   Patient assessment note: Pt with flat affect and depressed mood, attention to personal hygiene and grooming is fair, eye contact is good, speech is clear & coherent. Thought contents are organized and logical, and pt currently denies SI/HI/AVH or paranoia. There is no evidence of delusional thoughts.    Pt reports a poor sleep quality last night, reports appetite has been poor, rates his depression as 8, 10 being worst, rates anxiety as 8, 10 being worse.  He states that he is continuing to have cravings for substances of abuse such as amphetamines.  He however verbalizes continues interest and motivation to go to rehabilitation for substance use.  Reports that last  night, he had trouble falling and staying asleep.  He is requesting an increase in his Wellbutrin, and states that at 300 mg in the past, the medication was more effective in management of his depressive symptoms.  We are increasing this medication to 300 mg daily for depressive symptoms, and increasing trazodone to 100 mg nightly PRN for sleep.  CSW is continuing to work on seeking rehab placements for patient.  He is continuing to require inpatient hospitalization at this time for treatment and stabilization of his mental status.  Total Time spent with patient: 45 minutes  Past Psychiatric History: See H & P  Past Medical History:  Past Medical History:  Diagnosis Date   Alcohol abuse    Anxiety    Bipolar 2 disorder (HCC)    Cocaine abuse (HCC)    Depression    Kidney stones    Methamphetamine abuse, episodic (HCC)    Myocardial infarction (HCC)    Narcotic abuse (HCC)    Peptic ulcer    Polysubstance abuse (HCC)    Renal disorder    kidney stones    Past Surgical History:  Procedure Laterality Date   ESOPHAGOGASTRODUODENOSCOPY N/A 05/29/2014   Procedure: ESOPHAGOGASTRODUODENOSCOPY (EGD) ;  Surgeon: Theda Belfast, MD;  Location: [facility] ENDOSCOPY;  Service: Endoscopy;  Laterality: N/A;  check with Dr. Loreta Ave about sedation type/timinng - I recommend MAC   HAND SURGERY  2006   LEFT HEART CATH AND CORONARY ANGIOGRAPHY N/A 06/21/2020   Procedure: LEFT HEART CATH AND CORONARY ANGIOGRAPHY;  Surgeon: Yvonne Kendall, MD;  Location: [facility] INVASIVE CV  LAB;  Service: Cardiovascular;  Laterality: N/A;   Family History:  Family History  Problem Relation Age of Onset   Heart disease Father    Family Psychiatric  History: See H & P Social History:  Social History   Substance and Sexual Activity  Alcohol Use Not Currently     Social History   Substance and Sexual Activity  Drug Use Yes   Types: IV, Methamphetamines    Social History   Socioeconomic History   Marital status: Single     Spouse name: Not on file   Number of children: Not on file   Years of education: Not on file   Highest education level: Not on file  Occupational History   Not on file  Tobacco Use   Smoking status: Every Day    Packs/day: .5    Types: Cigarettes   Smokeless tobacco: Never  Vaping Use   Vaping Use: Never used  Substance and Sexual Activity   Alcohol use: Not Currently   Drug use: Yes    Types: IV, Methamphetamines   Sexual activity: Not Currently  Other Topics Concern   Not on file  Social History Narrative   ** Merged History Encounter **       Lives with a friend   Lost house and custody of son 9broke up with fiancee)   Corporate investment banker   Social Determinants of Health   Financial Resource Strain: Not on file  Food Insecurity: Food Insecurity Present (04/17/2023)   Hunger Vital Sign    Worried About Running Out of Food in the Last Year: Sometimes true    Ran Out of Food in the Last Year: Sometimes true  Transportation Needs: No Transportation Needs (04/17/2023)   PRAPARE - Administrator, Civil Service (Medical): No    Lack of Transportation (Non-Medical): No  Physical Activity: Not on file  Stress: Not on file  Social Connections: Not on file   Sleep: Good  Appetite:  Poor  Current Medications: Current Facility-Administered Medications  Medication Dose Route Frequency Provider Last Rate Last Admin   acetaminophen (TYLENOL) tablet 650 mg  650 mg Oral Q6H PRN White, Patrice L, NP       alum & mag hydroxide-simeth (MAALOX/MYLANTA) 200-200-20 MG/5ML suspension 30 mL  30 mL Oral Q4H PRN White, Patrice L, NP       atorvastatin (LIPITOR) tablet 40 mg  40 mg Oral Daily Starleen Blue, NP   40 mg at 04/21/23 0811   [START ON 04/22/2023] buPROPion (WELLBUTRIN XL) 24 hr tablet 300 mg  300 mg Oral Daily Shuntae Herzig, Tyler Aas, NP       cephALEXin (KEFLEX) capsule 500 mg  500 mg Oral Q6H White, Patrice L, NP   500 mg at 04/21/23 1204   [START ON 04/22/2023] cloNIDine  (CATAPRES) tablet 0.1 mg  0.1 mg Oral QAC breakfast White, Patrice L, NP       dicyclomine (BENTYL) tablet 20 mg  20 mg Oral Q6H PRN White, Patrice L, NP   20 mg at 04/19/23 2048   diphenhydrAMINE (BENADRYL) capsule 50 mg  50 mg Oral TID PRN White, Patrice L, NP       Or   diphenhydrAMINE (BENADRYL) injection 50 mg  50 mg Intramuscular TID PRN White, Patrice L, NP       doxycycline (VIBRA-TABS) tablet 100 mg  100 mg Oral BID White, Patrice L, NP   100 mg at 04/21/23 0811   gabapentin (NEURONTIN) capsule 200 mg  200 mg Oral TID Starleen Blue, NP   200 mg at 04/21/23 0810   haloperidol (HALDOL) tablet 5 mg  5 mg Oral TID PRN Liborio Nixon L, NP       Or   haloperidol lactate (HALDOL) injection 5 mg  5 mg Intramuscular TID PRN White, Patrice L, NP       hydrOXYzine (ATARAX) tablet 25 mg  25 mg Oral TID PRN White, Patrice L, NP   25 mg at 04/19/23 2048   loperamide (IMODIUM) capsule 2-4 mg  2-4 mg Oral PRN White, Patrice L, NP       magnesium hydroxide (MILK OF MAGNESIA) suspension 30 mL  30 mL Oral Daily PRN White, Patrice L, NP       methocarbamol (ROBAXIN) tablet 500 mg  500 mg Oral TID Starleen Blue, NP   500 mg at 04/21/23 1205   naproxen (NAPROSYN) tablet 500 mg  500 mg Oral BID WC Colby Reels, NP   500 mg at 04/21/23 0810   ondansetron (ZOFRAN-ODT) disintegrating tablet 4 mg  4 mg Oral Q6H PRN White, Patrice L, NP   4 mg at 04/17/23 1612   QUEtiapine (SEROQUEL) tablet 200 mg  200 mg Oral QHS Ayanni Tun, NP   200 mg at 04/20/23 2104   traZODone (DESYREL) tablet 100 mg  100 mg Oral QHS PRN Starleen Blue, NP        Lab Results:  No results found for this or any previous visit (from the past 48 hour(s)).   Blood Alcohol level:  Lab Results  Component Value Date   ETH <10 04/14/2023   ETH <10 04/11/2023    Metabolic Disorder Labs: Lab Results  Component Value Date   HGBA1C 5.7 (H) 04/19/2023   MPG 116.89 04/19/2023   MPG 116.89 08/09/2022   No results found for:  "PROLACTIN" Lab Results  Component Value Date   CHOL 120 04/19/2023   TRIG 105 04/19/2023   HDL 45 04/19/2023   CHOLHDL 2.7 04/19/2023   VLDL 21 04/19/2023   LDLCALC 54 04/19/2023   LDLCALC 71 08/09/2022    Physical Findings: AIMS:  , ,  ,  ,    CIWA:    COWS:  COWS Total Score: 4  Musculoskeletal: Strength & Muscle Tone: within normal limits Gait & Station: normal Patient leans: N/A  Psychiatric Specialty Exam:  Presentation  General Appearance:  Appropriate for Environment; Fairly Groomed  Eye Contact: Fair  Speech: Clear and Coherent  Speech Volume: Normal  Handedness: Right   Mood and Affect  Mood: Depressed  Affect: Congruent   Thought Process  Thought Processes: Coherent  Descriptions of Associations:Intact  Orientation:Full (Time, Place and Person)  Thought Content:Logical  History of Schizophrenia/Schizoaffective disorder:No  Duration of Psychotic Symptoms:No data recorded Hallucinations:Hallucinations: None   Ideas of Reference:None  Suicidal Thoughts:Suicidal Thoughts: No   Homicidal Thoughts:Homicidal Thoughts: No    Sensorium  Memory: Immediate Good  Judgment: Fair  Insight: Fair   Chartered certified accountant: Fair  Attention Span: Fair  Recall: [employer] of Knowledge: Fair  Language: Fair   Psychomotor Activity  Psychomotor Activity: Psychomotor Activity: Normal    Assets  Assets: Communication Skills   Sleep  Sleep: Sleep: Poor     Physical Exam: Physical Exam Constitutional:      Appearance: Normal appearance.  Eyes:     Pupils: Pupils are equal, round, and reactive to light.  Musculoskeletal:     Cervical back: Normal range of motion.  Neurological:     Mental Status: He is alert and oriented to person, place, and time.    Review of Systems  Constitutional:  Negative for fever.  HENT:  Negative for hearing loss.   Eyes:  Negative for blurred vision.   Respiratory:  Negative for cough.   Cardiovascular:  Negative for chest pain.  Gastrointestinal:  Negative for heartburn.  Genitourinary:  Negative for dysuria.  Musculoskeletal:  Negative for myalgias.  Skin:  Negative for rash.  Neurological:  Negative for dizziness.  Psychiatric/Behavioral:  Positive for depression and substance abuse. Negative for hallucinations, memory loss and suicidal ideas. The patient is nervous/anxious and has insomnia.    Blood pressure 114/72, pulse 67, temperature 98.1 F (36.7 C), temperature source Oral, resp. rate 18, height 5\' 8"  (1.727 m), weight 68 kg, SpO2 100 %. Body mass index is 22.81 kg/m.  Treatment Plan Summary: Daily contact with patient to assess and evaluate symptoms and progress in treatment and Medication management   Safety and Monitoring: Voluntary admission to inpatient psychiatric unit for safety, stabilization and treatment Daily contact with patient to assess and evaluate symptoms and progress in treatment Patient's case to be discussed in multi-disciplinary team meeting Observation Level : q15 minute checks Vital signs: q12 hours Precautions: Safety   Long Term Goal(s): Improvement in symptoms so as ready for discharge   Short Term Goals: Ability to identify changes in lifestyle to reduce recurrence of condition will improve, Ability to verbalize feelings will improve, Ability to disclose and discuss suicidal ideas, Ability to demonstrate self-control will improve, Ability to identify and develop effective coping behaviors will improve, Ability to maintain clinical measurements within normal limits will improve, Compliance with prescribed medications will improve, and Ability to identify triggers associated with substance abuse/mental health issues will improve   Diagnoses Principal Problem:   Bipolar 2 disorder, major depressive episode (HCC) Active Problems:   Opioid use disorder   Methamphetamine abuse (HCC)   Opioid use  with withdrawal (HCC)   GAD (generalized anxiety disorder)   Pure hypercholesterolemia   Medications -Increase Wellbutrin from 150 mg to 300 mg daily for depressive symptoms -Continue gabapentin 200 mg 3 times daily for anxiety -Continue Seroquel and increase to 200 mg nightly for sleep and mood stabilization -Continue Clonidine taper for opioid detox as per the [facility] -Continue COWS protocol   Medications for medical reasons -Continue Keflex 500 mg every 6 hours for cellulitis to the right ankle (redness and induration noted to right ankle area, and pt denies injecting any substances into this area.) -Continue doxycycline 100 mg twice daily for cellulitis to the right ankle    PRNS -Continue Naproxen 500 mg BID for muscle pain -Continue Robaxin 500 mg every 8 hours and change from PRN to scheduled -Increase trazodone from 50 mg nightly to 100 mg nightly PRN as needed for sleep -Continue hydroxyzine 25 mg as needed for anxiety 3 times daily -Continued Bentyl 20 mg every 6 hours as needed for abdominal pain and cramping -Continue Tylenol 650 mg every 6 hours PRN for mild pain -Continue Maalox 30 mg every 4 hrs PRN for indigestion -Continue Milk of Magnesia as needed every 6 hrs for constipation   Labs reviewed: Acute hepatitis panel negative.  Was checked due to elevated LFTs.  Other labs within normal limits.  Discharge Planning: Social work and case management to assist with discharge planning and identification of hospital follow-up needs prior to discharge Estimated LOS: 5-7 days Discharge Concerns: Need to establish a  safety plan; Medication compliance and effectiveness Discharge Goals: Return home with outpatient referrals for mental health follow-up including medication management/psychotherapy   I certify that inpatient services furnished can reasonably be expected to improve the patient's condition.     Starleen Blue, NP  Starleen Blue, NP 04/21/2023, 1:55 PMPatient ID:  Brian [name], male   DOB: November 17, 1985, 37 y.o.   MRN: [id number]

## 2023-04-21 NOTE — Group Note (Signed)
Recreation Therapy Group Note   Group Topic:Animal Assisted Therapy   Group Date: 04/21/2023 Start Time: 0945 End Time: 1031 Facilitators: Alayiah Fontes-McCall, LRT,CTRS Location: [address] Day[address]   Animal-Assisted Activity (AAA) Program Checklist/Progress Notes Patient Eligibility Criteria Checklist & Daily Group note for Rec Tx Intervention  AAA/T Program Assumption of Risk Form signed by Patient/ or Parent Legal Guardian Yes  Patient understands his/her participation is voluntary Yes  Affect/Mood: N/A   Participation Level: Did not attend    Clinical Observations/Individualized Feedback:     Plan: Continue to engage patient in RT group sessions 2-3x/week.   Altha Sweitzer-McCall, LRT,CTRS 04/21/2023 12:49 PM

## 2023-04-21 NOTE — Plan of Care (Addendum)
Nurse discussed anxiety, depression and coping skills with patient.  

## 2023-04-21 NOTE — Group Note (Signed)
Date:  04/21/2023 Time:  12:20 PM  Group Topic/Focus:  Coping With Mental Health Crisis:   The purpose of this group is to help patients identify strategies for coping with mental health crisis.  Group discusses possible causes of crisis and ways to manage them effectively. Early Warning Signs:   The focus of this group is to help patients identify signs or symptoms they exhibit before slipping into an unhealthy state or crisis.    Participation Level:  Did Not Attend  Participation Quality:    Affect:    Cognitive:    Insight:   Engagement in Group:    Modes of Intervention:    Additional Comments:    [name] 04/21/2023, 12:20 PM

## 2023-04-22 DIAGNOSIS — F3181 Bipolar II disorder: Secondary | ICD-10-CM | POA: Diagnosis not present

## 2023-04-22 MED ORDER — METHOCARBAMOL 500 MG PO TABS: 500.0000 mg | ORAL_TABLET | Freq: Three times a day (TID) | ORAL | Status: DC | PRN

## 2023-04-22 NOTE — Group Note (Signed)
Recreation Therapy Group Note   Group Topic:Stress Management  Group Date: 04/22/2023 Start Time: 0935 End Time: 1000 Facilitators: Juaquin Ludington-McCall, LRT,CTRS Location: [address]room   Goal Area(s) Addresses:  Patient will actively participate in stress management techniques presented during session.  Patient will successfully identify benefit of practicing stress management post d/c.   Group Description: Guided Imagery. LRT provided education, instruction, and demonstration on practice of visualization via guided imagery. Patient was asked to participate in the technique introduced during session. LRT debriefed including topics of mindfulness, stress management and specific scenarios each patient could use these techniques. Patients were given suggestions of ways to access scripts post d/c and encouraged to explore Youtube and other apps available on smartphones, tablets, and computers.   Affect/Mood: N/A   Participation Level: Did not attend    Clinical Observations/Individualized Feedback:     Plan: Continue to engage patient in RT group sessions 2-3x/week.   Jakala Herford-McCall, LRT,CTRS 04/22/2023 11:12 AM

## 2023-04-22 NOTE — Progress Notes (Signed)
   04/21/23 2100  Psych Admission Type (Psych Patients Only)  Admission Status Voluntary  Psychosocial Assessment  Patient Complaints Substance abuse  Eye Contact Fair  Facial Expression Animated  Affect Anxious  Speech Logical/coherent  Interaction Assertive  Motor Activity Fidgety  Appearance/Hygiene Unremarkable  Behavior Characteristics Cooperative;Appropriate to situation  Mood Anxious;Pleasant  Thought Process  Coherency WDL  Content WDL  Delusions None reported or observed  Perception WDL  Hallucination None reported or observed  Judgment Impaired  Confusion None  Danger to Self  Current suicidal ideation? Denies  Agreement Not to Harm Self Yes  Description of Agreement verbal  Danger to Others  Danger to Others None reported or observed   Pt is pleasant and cooperative. Pt was offered support and encouragement. Pt was given scheduled medications and PRN Trazodone per MAR. Q 15 minute checks were done for safety. Pt attended group and interacts well with peers and staff. Pt has no complaints.Pt receptive to treatment and safety maintained on unit.

## 2023-04-22 NOTE — [facility] Group Notes (Signed)
[facility] Group Notes:  (Nursing/MHT/Case Management/Adjunct)  Date:  04/22/2023  Time:  2000  Type of Therapy:   Narcotics Anonymous Meeting  Participation Level:  Active  Participation Quality:  Appropriate, Attentive, Sharing, and Supportive  Affect:  Anxious  Cognitive:  Alert  Insight:  Improving  Engagement in Group:  Engaged  Modes of Intervention:  Education and Support  Summary of Progress/Problems:  Brian [name] 04/22/2023, 9:46 PM

## 2023-04-22 NOTE — Progress Notes (Signed)
[facility] MD Progress Note  04/22/2023 2:40 PM Brian [name]  MRN:  [id number] Principal Problem: Bipolar 2 disorder, major depressive episode (HCC) Diagnosis: Principal Problem:   Bipolar 2 disorder, major depressive episode (HCC) Active Problems:   Opioid use disorder   Methamphetamine abuse (HCC)   Opioid use with withdrawal (HCC)   GAD (generalized anxiety disorder)   Pure hypercholesterolemia  Reason for Admission: Brian [name] is a 37 yo CM with a prior mental health history of ADHD, bipolar 2 disorder, PTSD, anxiety, who presented to the [facility] on 06/21 with complaints of worsening depressive symptoms and SI with a plan to overdose on fentanyl in the context of his recent relapse.  Patient reported being kicked out from an [place] house where he was staying due to his relapse and is now homeless.  He was transferred voluntarily to this behavioral health Hospital for treatment and stabilization of his mental status on 06/21.    24 hr chart review: Sleep Hours last night: Good as per patient Nursing Concerns: None reported Behavioral episodes in the past 24 hrs: None Medication Compliance: Compliant Vital Signs in the past 24 hrs: Elevated earlier today morning, but WNL when rechecked. Most likely cause of elevation was wrong cuff size as there is a big difference in the two readings  PRN Medications in the past 24 hrs: Trazodone   Patient assessment note: Pt presents with a significantly less depressed mood as compared to time of hospitalization. Mood is significantly better as per both objective and subjective assessments of pt. His attention to personal hygiene and grooming is fair, eye contact is good, speech is clear & coherent. Thought contents are organized and logical, and pt currently denies SI/HI/AVH or paranoia. There is no evidence of delusional thoughts.    Pt reports an improved sleep quality with the med adjustments that were  completed yesterday.  He reports appetite has been fair, but improving.  He denies medication related side effects.  Denies any concerns with bowel movements.  Reports generalized body aches and pains, but states that the naproxen along with the Robaxin not helping with pain management.  Reports depression and anxiety today as 6, 10 being worse.  Only complaint today is cravings for substances of abuse, but he states that he is not giving him to the cravings, and reports continuous motivation to go to substance abuse treatment.  As per CSW, patient has been accepted into [facility], and she will make arrangements for patient to be picked up tomorrow 04/23/2023 for transportation to the program.  We are continuing all medications as listed below with no changes today.  Total Time spent with patient: 45 minutes  Past Psychiatric History: See H & P  Past Medical History:  Past Medical History:  Diagnosis Date   Alcohol abuse    Anxiety    Bipolar 2 disorder (HCC)    Cocaine abuse (HCC)    Depression    Kidney stones    Methamphetamine abuse, episodic (HCC)    Myocardial infarction (HCC)    Narcotic abuse (HCC)    Peptic ulcer    Polysubstance abuse (HCC)    Renal disorder    kidney stones    Past Surgical History:  Procedure Laterality Date   ESOPHAGOGASTRODUODENOSCOPY N/A 05/29/2014   Procedure: ESOPHAGOGASTRODUODENOSCOPY (EGD) ;  Surgeon: Theda Belfast, MD;  Location: [facility] ENDOSCOPY;  Service: Endoscopy;  Laterality: N/A;  check with Dr. Loreta Ave about sedation type/timinng - I recommend  MAC   HAND SURGERY  2006   LEFT HEART CATH AND CORONARY ANGIOGRAPHY N/A 06/21/2020   Procedure: LEFT HEART CATH AND CORONARY ANGIOGRAPHY;  Surgeon: Yvonne Kendall, MD;  Location: [facility] INVASIVE CV LAB;  Service: Cardiovascular;  Laterality: N/A;   Family History:  Family History  Problem Relation Age of Onset   Heart disease Father    Family Psychiatric  History: See H & P Social History:  Social  History   Substance and Sexual Activity  Alcohol Use Not Currently     Social History   Substance and Sexual Activity  Drug Use Yes   Types: IV, Methamphetamines    Social History   Socioeconomic History   Marital status: Single    Spouse name: Not on file   Number of children: Not on file   Years of education: Not on file   Highest education level: Not on file  Occupational History   Not on file  Tobacco Use   Smoking status: Every Day    Packs/day: .5    Types: Cigarettes   Smokeless tobacco: Never  Vaping Use   Vaping Use: Never used  Substance and Sexual Activity   Alcohol use: Not Currently   Drug use: Yes    Types: IV, Methamphetamines   Sexual activity: Not Currently  Other Topics Concern   Not on file  Social History Narrative   ** Merged History Encounter **       Lives with a friend   Lost house and custody of son 9broke up with fiancee)   Corporate investment banker   Social Determinants of Health   Financial Resource Strain: Not on file  Food Insecurity: Food Insecurity Present (04/17/2023)   Hunger Vital Sign    Worried About Running Out of Food in the Last Year: Sometimes true    Ran Out of Food in the Last Year: Sometimes true  Transportation Needs: No Transportation Needs (04/17/2023)   PRAPARE - Administrator, Civil Service (Medical): No    Lack of Transportation (Non-Medical): No  Physical Activity: Not on file  Stress: Not on file  Social Connections: Not on file   Sleep: Good  Appetite:  Poor  Current Medications: Current Facility-Administered Medications  Medication Dose Route Frequency Provider Last Rate Last Admin   acetaminophen (TYLENOL) tablet 650 mg  650 mg Oral Q6H PRN White, Patrice L, NP       alum & mag hydroxide-simeth (MAALOX/MYLANTA) 200-200-20 MG/5ML suspension 30 mL  30 mL Oral Q4H PRN White, Patrice L, NP       atorvastatin (LIPITOR) tablet 40 mg  40 mg Oral Daily Bennye Nix, NP   40 mg at 04/22/23 0816    buPROPion (WELLBUTRIN XL) 24 hr tablet 300 mg  300 mg Oral Daily Starleen Blue, NP   300 mg at 04/22/23 0816   cloNIDine (CATAPRES) tablet 0.1 mg  0.1 mg Oral QAC breakfast White, Patrice L, NP   0.1 mg at 04/22/23 2595   diphenhydrAMINE (BENADRYL) capsule 50 mg  50 mg Oral TID PRN White, Patrice L, NP       Or   diphenhydrAMINE (BENADRYL) injection 50 mg  50 mg Intramuscular TID PRN White, Patrice L, NP       doxycycline (VIBRA-TABS) tablet 100 mg  100 mg Oral BID White, Patrice L, NP   100 mg at 04/22/23 0817   gabapentin (NEURONTIN) capsule 200 mg  200 mg Oral TID Starleen Blue, NP   200 mg  at 04/22/23 0816   haloperidol (HALDOL) tablet 5 mg  5 mg Oral TID PRN Liborio Nixon L, NP       Or   haloperidol lactate (HALDOL) injection 5 mg  5 mg Intramuscular TID PRN White, Patrice L, NP       hydrOXYzine (ATARAX) tablet 25 mg  25 mg Oral TID PRN White, Patrice L, NP   25 mg at 04/19/23 2048   magnesium hydroxide (MILK OF MAGNESIA) suspension 30 mL  30 mL Oral Daily PRN White, Patrice L, NP       methocarbamol (ROBAXIN) tablet 500 mg  500 mg Oral Q8H PRN Massengill, Nathan, MD       naproxen (NAPROSYN) tablet 500 mg  500 mg Oral BID WC Breanda Greenlaw, NP   500 mg at 04/22/23 0816   QUEtiapine (SEROQUEL) tablet 200 mg  200 mg Oral QHS Dorien Mayotte, NP   200 mg at 04/21/23 2059   traZODone (DESYREL) tablet 100 mg  100 mg Oral QHS PRN Starleen Blue, NP   100 mg at 04/21/23 2059    Lab Results:  No results found for this or any previous visit (from the past 48 hour(s)).   Blood Alcohol level:  Lab Results  Component Value Date   ETH <10 04/14/2023   ETH <10 04/11/2023    Metabolic Disorder Labs: Lab Results  Component Value Date   HGBA1C 5.7 (H) 04/19/2023   MPG 116.89 04/19/2023   MPG 116.89 08/09/2022   No results found for: "PROLACTIN" Lab Results  Component Value Date   CHOL 120 04/19/2023   TRIG 105 04/19/2023   HDL 45 04/19/2023   CHOLHDL 2.7 04/19/2023   VLDL 21  04/19/2023   LDLCALC 54 04/19/2023   LDLCALC 71 08/09/2022    Physical Findings: AIMS:  , ,  ,  ,    CIWA:    COWS:  COWS Total Score: 5  Musculoskeletal: Strength & Muscle Tone: within normal limits Gait & Station: normal Patient leans: N/A  Psychiatric Specialty Exam:  Presentation  General Appearance:  Appropriate for Environment  Eye Contact: Fair  Speech: Clear and Coherent  Speech Volume: Normal  Handedness: Right   Mood and Affect  Mood: Depressed  Affect: Congruent   Thought Process  Thought Processes: Coherent  Descriptions of Associations:Intact  Orientation:Full (Time, Place and Person)  Thought Content:Logical  History of Schizophrenia/Schizoaffective disorder:No  Duration of Psychotic Symptoms:No data recorded Hallucinations:Hallucinations: None   Ideas of Reference:None  Suicidal Thoughts:Suicidal Thoughts: No   Homicidal Thoughts:Homicidal Thoughts: No    Sensorium  Memory: Immediate Good  Judgment: Fair  Insight: Fair   Art therapist  Concentration: Good  Attention Span: Good  Recall: Good  Fund of Knowledge: Fair  Language: Fair   Psychomotor Activity  Psychomotor Activity: Psychomotor Activity: Normal    Assets  Assets: Resilience   Sleep  Sleep: Sleep: Good     Physical Exam: Physical Exam Constitutional:      Appearance: Normal appearance.  Eyes:     Pupils: Pupils are equal, round, and reactive to light.  Musculoskeletal:     Cervical back: Normal range of motion.  Neurological:     Mental Status: He is alert and oriented to person, place, and time.    Review of Systems  Constitutional:  Negative for fever.  HENT:  Negative for hearing loss.   Eyes:  Negative for blurred vision.  Respiratory:  Negative for cough.   Cardiovascular:  Negative for chest pain.  Gastrointestinal:  Negative for heartburn.  Genitourinary:  Negative for dysuria.  Musculoskeletal:   Negative for myalgias.  Skin:  Negative for rash.  Neurological:  Negative for dizziness.  Psychiatric/Behavioral:  Positive for depression and substance abuse. Negative for hallucinations, memory loss and suicidal ideas. The patient is nervous/anxious and has insomnia.    Blood pressure 100/69, pulse 89, temperature 98 F (36.7 C), temperature source Oral, resp. rate 18, height 5\' 8"  (1.727 m), weight 68 kg, SpO2 100 %. Body mass index is 22.81 kg/m.  Treatment Plan Summary: Daily contact with patient to assess and evaluate symptoms and progress in treatment and Medication management   Safety and Monitoring: Voluntary admission to inpatient psychiatric unit for safety, stabilization and treatment Daily contact with patient to assess and evaluate symptoms and progress in treatment Patient's case to be discussed in multi-disciplinary team meeting Observation Level : q15 minute checks Vital signs: q12 hours Precautions: Safety   Long Term Goal(s): Improvement in symptoms so as ready for discharge   Short Term Goals: Ability to identify changes in lifestyle to reduce recurrence of condition will improve, Ability to verbalize feelings will improve, Ability to disclose and discuss suicidal ideas, Ability to demonstrate self-control will improve, Ability to identify and develop effective coping behaviors will improve, Ability to maintain clinical measurements within normal limits will improve, Compliance with prescribed medications will improve, and Ability to identify triggers associated with substance abuse/mental health issues will improve   Diagnoses Principal Problem:   Bipolar 2 disorder, major depressive episode (HCC) Active Problems:   Opioid use disorder   Methamphetamine abuse (HCC)   Opioid use with withdrawal (HCC)   GAD (generalized anxiety disorder)   Pure hypercholesterolemia   Medications -Increase Wellbutrin from 150 mg to 300 mg daily for depressive symptoms -Continue  gabapentin 200 mg 3 times daily for anxiety -Continue Seroquel and increase to 200 mg nightly for sleep and mood stabilization -Continue Clonidine taper for opioid detox as per the [facility] -Continue COWS protocol   Medications for medical reasons -Continue Keflex 500 mg every 6 hours for cellulitis to the right ankle (redness and induration noted to right ankle area, and pt denies injecting any substances into this area.) -Continue doxycycline 100 mg twice daily for cellulitis to the right ankle    PRNS -Continue Naproxen 500 mg BID for muscle pain -Continue Robaxin 500 mg every 8 hours and change from PRN to scheduled -Continue trazodone 100 mg nightly PRN as needed for sleep -Continue hydroxyzine 25 mg as needed for anxiety 3 times daily -Continued Bentyl 20 mg every 6 hours as needed for abdominal pain and cramping -Continue Tylenol 650 mg every 6 hours PRN for mild pain -Continue Maalox 30 mg every 4 hrs PRN for indigestion -Continue Milk of Magnesia as needed every 6 hrs for constipation   Labs reviewed: hepatitis C reactive. (Correction to previous note).  Was checked due to elevated LFTs.  Other labs within normal limits.  Discharge Planning: Social work and case management to assist with discharge planning and identification of hospital follow-up needs prior to discharge Estimated LOS: 5-7 days Discharge Concerns: Need to establish a safety plan; Medication compliance and effectiveness Discharge Goals: Return home with outpatient referrals for mental health follow-up including medication management/psychotherapy   I certify that inpatient services furnished can reasonably be expected to improve the patient's condition.     Starleen Blue, NP  Starleen Blue, NP 04/22/2023, 2:40 PMPatient ID: Brian [name], male   DOB:  1985-11-06, 37 y.o.   MRN: [id number] Patient ID: Brian [name], male   DOB: [birth year], 37 y.o.   MRN: [id number]

## 2023-04-22 NOTE — Progress Notes (Signed)
   04/22/23 0559  15 Minute Checks  Location Bedroom  Visual Appearance Calm  Behavior Sleeping  Sleep (Behavioral Health Patients Only)  Calculate sleep? (Click Yes once per 24 hr at 0600 safety check) Yes  Documented sleep last 24 hours 8.5

## 2023-04-22 NOTE — [facility] Suicide Risk Assessment (Signed)
[facility] INPATIENT:  Family/Significant Other Suicide Prevention Education  Suicide Prevention Education:  Family/Significant Other Refusal to Support Patient after Discharge:  Suicide Prevention Education Not Provided:  Patient has identified home of family/significant other as the place the patient will be residing after discharge.  With written consent of the patient, two attempts were made to provide Suicide Prevention Education to [employer], Cassie Patterson,  [phone].  This person indicates she will not be responsible for the patient after discharge.  Brian [name] 04/22/2023,1:24 PM

## 2023-04-22 NOTE — Progress Notes (Signed)
   04/22/23 0945  Psych Admission Type (Psych Patients Only)  Admission Status Voluntary  Psychosocial Assessment  Patient Complaints Substance abuse  Eye Contact Fair  Facial Expression Flat;Anxious  Affect Anxious;Irritable  Speech Logical/coherent  Interaction Assertive  Motor Activity Fidgety  Appearance/Hygiene Unremarkable  Behavior Characteristics Cooperative  Mood Anxious;Irritable  Thought Process  Coherency WDL  Content WDL  Delusions None reported or observed  Perception WDL  Hallucination None reported or observed  Judgment Impaired  Confusion None  Danger to Self  Current suicidal ideation? Denies  Self-Injurious Behavior No self-injurious ideation or behavior indicators observed or expressed   Agreement Not to Harm Self Yes  Description of Agreement verbal  Danger to Others  Danger to Others None reported or observed

## 2023-04-23 DIAGNOSIS — F1120 Opioid dependence, uncomplicated: Secondary | ICD-10-CM | POA: Diagnosis not present

## 2023-04-23 DIAGNOSIS — F3181 Bipolar II disorder: Secondary | ICD-10-CM | POA: Diagnosis not present

## 2023-04-23 DIAGNOSIS — F1520 Other stimulant dependence, uncomplicated: Secondary | ICD-10-CM | POA: Diagnosis not present

## 2023-04-23 MED ORDER — QUETIAPINE FUMARATE 200 MG PO TABS: 200.0000 mg | ORAL_TABLET | Freq: Every day | ORAL | 0 refills | Status: AC

## 2023-04-23 MED ORDER — GABAPENTIN 100 MG PO CAPS: 200.0000 mg | ORAL_CAPSULE | Freq: Three times a day (TID) | ORAL | 0 refills | Status: AC

## 2023-04-23 MED ORDER — BUPROPION HCL ER (XL) 300 MG PO TB24: 300.0000 mg | ORAL_TABLET | Freq: Every day | ORAL | 0 refills | Status: AC

## 2023-04-23 MED ORDER — HYDROXYZINE HCL 25 MG PO TABS: 25.0000 mg | ORAL_TABLET | Freq: Three times a day (TID) | ORAL | 0 refills | Status: AC | PRN

## 2023-04-23 MED ORDER — TRAZODONE HCL 100 MG PO TABS: 100.0000 mg | ORAL_TABLET | Freq: Every evening | ORAL | 0 refills | Status: AC | PRN

## 2023-04-23 MED ORDER — NAPROXEN 500 MG PO TABS
500.0000 mg | ORAL_TABLET | Freq: Two times a day (BID) | ORAL | Status: DC | PRN
  Filled 2023-04-23: qty 10

## 2023-04-23 MED ORDER — NAPROXEN 500 MG PO TABS: 500.0000 mg | ORAL_TABLET | Freq: Two times a day (BID) | ORAL | 0 refills | Status: AC | PRN

## 2023-04-23 MED ORDER — ATORVASTATIN CALCIUM 40 MG PO TABS: 40.0000 mg | ORAL_TABLET | Freq: Every day | ORAL | 0 refills | Status: AC

## 2023-04-23 NOTE — Discharge Summary (Signed)
Physician Discharge Summary Note  Patient:  Brian [name] is an 37 y.o., male MRN:  [id number] DOB:  05-02-1986 Patient phone:  [phone] (home)  Patient address:   [address] [place] [address],  Total Time spent with patient: 20 minutes  Date of Admission:  04/17/2023 Date of Discharge: 04-23-2023  Reason for Admission:   Brian [name] is a 37 yo CM with a prior mental health history of ADHD, bipolar 2 disorder, PTSD, anxiety, who presented to the [facility] on 06/21 with complaints of worsening depressive symptoms and SI with a plan to overdose on fentanyl in the context of his recent relapse. Patient reported being kicked out from an [place] house where he was staying due to his relapse and is now homeless. He was transferred voluntarily to this behavioral health Hospital for treatment and stabilization of his mental status on 06/21.     Principal Problem: Bipolar 2 disorder, major depressive episode (HCC) Discharge Diagnoses: Principal Problem:   Bipolar 2 disorder, major depressive episode (HCC) Active Problems:   Opioid use disorder   Methamphetamine abuse (HCC)   Opioid use with withdrawal (HCC)   GAD (generalized anxiety disorder)   Pure hypercholesterolemia   Past Psychiatric History:  Previous Psych Diagnoses: Bipolar 2, MDD, ADHD, PTSD.  Polysubstance use Prior inpatient treatment: Multiple were unable to recall.  Multiple as per chart review, last admission at this behavioral health Hospital was on 12/21/2020. Current/prior outpatient treatment: None at this time, Prior rehab hx: Denies Psychotherapy hx: Denies History of suicide attempt: Via overdosing, unsure when History of homicide or aggression: At a younger age Psychiatric medication history: As listed above Psychiatric medication compliance history: Noncompliant Neuromodulation history: Denies Current Psychiatrist: None Current therapist: None   Past  Medical History:  Past Medical History:  Diagnosis Date   Alcohol abuse    Anxiety    Bipolar 2 disorder (HCC)    Cocaine abuse (HCC)    Depression    Kidney stones    Methamphetamine abuse, episodic (HCC)    Myocardial infarction (HCC)    Narcotic abuse (HCC)    Peptic ulcer    Polysubstance abuse (HCC)    Renal disorder    kidney stones    Past Surgical History:  Procedure Laterality Date   ESOPHAGOGASTRODUODENOSCOPY N/A 05/29/2014   Procedure: ESOPHAGOGASTRODUODENOSCOPY (EGD) ;  Surgeon: Theda Belfast, MD;  Location: [facility] ENDOSCOPY;  Service: Endoscopy;  Laterality: N/A;  check with Dr. Loreta Ave about sedation type/timinng - I recommend MAC   HAND SURGERY  2006   LEFT HEART CATH AND CORONARY ANGIOGRAPHY N/A 06/21/2020   Procedure: LEFT HEART CATH AND CORONARY ANGIOGRAPHY;  Surgeon: Yvonne Kendall, MD;  Location: [facility] INVASIVE CV LAB;  Service: Cardiovascular;  Laterality: N/A;   Family History:  Family History  Problem Relation Age of Onset   Heart disease Father    Family Psychiatric  History:  Medical: Denies Psych: Depression in grandmother who completed suicide. Psych Rx: Unsure SA/HA: As above Substance use family hx: Himself  Social History:  Social History   Substance and Sexual Activity  Alcohol Use Not Currently     Social History   Substance and Sexual Activity  Drug Use Yes   Types: IV, Methamphetamines    Social History   Socioeconomic History   Marital status: Single    Spouse name: Not on file   Number of children: Not on file   Years of education: Not on file  Highest education level: Not on file  Occupational History   Not on file  Tobacco Use   Smoking status: Every Day    Packs/day: .5    Types: Cigarettes   Smokeless tobacco: Never  Vaping Use   Vaping Use: Never used  Substance and Sexual Activity   Alcohol use: Not Currently   Drug use: Yes    Types: IV, Methamphetamines   Sexual activity: Not Currently  Other Topics Concern    Not on file  Social History Narrative   ** Merged History Encounter **       Lives with a friend   Lost house and custody of son 9broke up with fiancee)   Corporate investment banker   Social Determinants of Health   Financial Resource Strain: Not on file  Food Insecurity: Food Insecurity Present (04/17/2023)   Hunger Vital Sign    Worried About Running Out of Food in the Last Year: Sometimes true    Ran Out of Food in the Last Year: Sometimes true  Transportation Needs: No Transportation Needs (04/17/2023)   PRAPARE - Administrator, Civil Service (Medical): No    Lack of Transportation (Non-Medical): No  Physical Activity: Not on file  Stress: Not on file  Social Connections: Not on file    Hospital Course:   During the patient's hospitalization, patient had extensive initial psychiatric evaluation, and follow-up psychiatric evaluations every day.  Psychiatric diagnoses provided upon initial assessment:  Bipolar 2 disorder, major depressive episode (HCC)   Opioid use disorder   Methamphetamine abuse (HCC)   Opioid use with withdrawal (HCC)   GAD (generalized anxiety disorder)  Patient's psychiatric medications were adjusted on admission:  -Start Wellbutrin 150 mg daily for depressive symptoms -Start gabapentin 200 mg 3 times daily for anxiety -Continue Seroquel and increase to 200 mg nightly for sleep and mood stabilization -Continue clonidine taper for opioid detox as per the [facility] -Continue COWS protocol  During the hospitalization, other adjustments were made to the patient's psychiatric medication regimen:  -wellbutrin increased to XL 300 mg once daily  Patient's care was discussed during the interdisciplinary team meeting every day during the hospitalization.  The patient denied having side effects to prescribed psychiatric medication.  Gradually, patient started adjusting to milieu. The patient was evaluated each day by a clinical provider to ascertain response  to treatment. Improvement was noted by the patient's report of decreasing symptoms, improved sleep and appetite, affect, medication tolerance, behavior, and participation in unit programming.  Patient was asked each day to complete a self inventory noting mood, mental status, pain, new symptoms, anxiety and concerns.    Symptoms were reported as significantly decreased or resolved completely by discharge.   On day of discharge, the patient reports that their mood is stable. The patient denied having suicidal thoughts for more than 48 hours prior to discharge.  Patient denies having homicidal thoughts.  Patient denies having auditory hallucinations.  Patient denies any visual hallucinations or other symptoms of psychosis. The patient was motivated to continue taking medication with a goal of continued improvement in mental health.   The patient reports their target psychiatric symptoms of depression and suicidal thoughts, all responded well to the psychiatric medications, and the patient reports overall benefit other psychiatric hospitalization. Supportive psychotherapy was provided to the patient. The patient also participated in regular group therapy while hospitalized. Coping skills, problem solving as well as relaxation therapies were also part of the unit programming.  Labs were reviewed with  the patient, and abnormal results were discussed with the patient.  The patient is able to verbalize their individual safety plan to this provider.  # It is recommended to the patient to continue psychiatric medications as prescribed, after discharge from the hospital.    # It is recommended to the patient to follow up with your outpatient psychiatric provider and PCP.  # It was discussed with the patient, the impact of alcohol, drugs, tobacco have been there overall psychiatric and medical wellbeing, and total abstinence from substance use was recommended the patient.ed.  # Prescriptions provided or sent  directly to preferred pharmacy at discharge. Patient agreeable to plan. Given opportunity to ask questions. Appears to feel comfortable with discharge.    # In the event of worsening symptoms, the patient is instructed to call the crisis hotline, 911 and or go to the nearest ED for appropriate evaluation and treatment of symptoms. To follow-up with primary care provider for other medical issues, concerns and or health care needs  # Patient was discharged to [facility], with a plan to follow up as noted below.   Physical Findings: AIMS:  , ,  ,  ,    CIWA:    COWS:  COWS Total Score: 0  Musculoskeletal: Strength & Muscle Tone: within normal limits Gait & Station: normal Patient leans: N/A  Aims score zero on my exam. No eps on my exam.  Psychiatric Specialty Exam:  Presentation  General Appearance:  Fairly Groomed  Eye Contact: Fair  Speech: Normal Rate  Speech Volume: Normal  Handedness: Right   Mood and Affect  Mood: Irritable  Affect: Congruent; Full Range   Thought Process  Thought Processes: Linear  Descriptions of Associations:Intact  Orientation:Full (Time, Place and Person)  Thought Content:Logical  History of Schizophrenia/Schizoaffective disorder:No  Duration of Psychotic Symptoms:No data recorded Hallucinations:Hallucinations: None  Ideas of Reference:None  Suicidal Thoughts:Suicidal Thoughts: No  Homicidal Thoughts:Homicidal Thoughts: No   Sensorium  Memory: Immediate Good; Recent Good; Remote Good  Judgment: Fair  Insight: Fair   Art therapist  Concentration: Fair  Attention Span: Fair  Recall: Good  Fund of Knowledge: Fair  Language: Fair   Psychomotor Activity  Psychomotor Activity: Psychomotor Activity: Normal   Assets  Assets: Resilience   Sleep  Sleep: Sleep: Fair    Physical Exam: Physical Exam Vitals reviewed.  Constitutional:      General: He is not in acute distress.     Appearance: He is normal weight. He is not toxic-appearing.  Pulmonary:     Effort: Pulmonary effort is normal. No respiratory distress.  Neurological:     Mental Status: He is alert.     Motor: No weakness.     Gait: Gait normal.  Psychiatric:        Mood and Affect: Mood normal.        Behavior: Behavior normal.        Thought Content: Thought content normal.        Judgment: Judgment normal.    Review of Systems  Constitutional:  Negative for chills and fever.  Cardiovascular:  Negative for chest pain and palpitations.  Neurological:  Negative for dizziness, tingling, tremors and headaches.  Psychiatric/Behavioral:  Positive for substance abuse. Negative for depression, hallucinations, memory loss and suicidal ideas. The patient is not nervous/anxious and does not have insomnia.   All other systems reviewed and are negative.  Blood pressure 100/69, pulse 89, temperature 98 F (36.7 C), temperature source Oral, resp. rate 18, height  5\' 8"  (1.727 m), weight 68 kg, SpO2 100 %. Body mass index is 22.81 kg/m.   Social History   Tobacco Use  Smoking Status Every Day   Packs/day: .5   Types: Cigarettes  Smokeless Tobacco Never   Tobacco Cessation:  Prescription not provided because: pt specifically declined   Blood Alcohol level:  Lab Results  Component Value Date   ETH <10 04/14/2023   ETH <10 04/11/2023    Metabolic Disorder Labs:  Lab Results  Component Value Date   HGBA1C 5.7 (H) 04/19/2023   MPG 116.89 04/19/2023   MPG 116.89 08/09/2022   No results found for: "PROLACTIN" Lab Results  Component Value Date   CHOL 120 04/19/2023   TRIG 105 04/19/2023   HDL 45 04/19/2023   CHOLHDL 2.7 04/19/2023   VLDL 21 04/19/2023   LDLCALC 54 04/19/2023   LDLCALC 71 08/09/2022    See Psychiatric Specialty Exam and Suicide Risk Assessment completed by Attending Physician prior to discharge.  Discharge destination:  Other:  [facility]  Is patient on multiple  antipsychotic therapies at discharge:  No   Has Patient had three or more failed trials of antipsychotic monotherapy by history:  No  Recommended Plan for Multiple Antipsychotic Therapies: NA  Discharge Instructions     Diet - low sodium heart healthy   Complete by: As directed    Increase activity slowly   Complete by: As directed       Allergies as of 04/23/2023       Reactions   Fish Allergy Anaphylaxis   Shellfish Allergy Anaphylaxis        Medication List     STOP taking these medications    cephALEXin 500 MG capsule Commonly known as: KEFLEX   isosorbide dinitrate 30 MG tablet Commonly known as: ISORDIL       TAKE these medications      Indication  atorvastatin 40 MG tablet Commonly known as: LIPITOR Take 1 tablet (40 mg total) by mouth daily. Start taking on: April 24, 2023 What changed:  medication strength how much to take  Indication: High Amount of Triglycerides in the Blood   buPROPion 300 MG 24 hr tablet Commonly known as: WELLBUTRIN XL Take 1 tablet (300 mg total) by mouth daily.  Indication: Major Depressive Disorder   doxycycline 100 MG capsule Commonly known as: VIBRAMYCIN Take 1 capsule (100 mg total) by mouth 2 (two) times daily.  Indication: home med   gabapentin 100 MG capsule Commonly known as: NEURONTIN Take 2 capsules (200 mg total) by mouth 3 (three) times daily. What changed:  medication strength how much to take  Indication: Generalized Anxiety Disorder   hydrOXYzine 25 MG tablet Commonly known as: ATARAX Take 1 tablet (25 mg total) by mouth 3 (three) times daily as needed for anxiety.  Indication: Feeling Anxious   naproxen 500 MG tablet Commonly known as: NAPROSYN Take 1 tablet (500 mg total) by mouth 2 (two) times daily as needed.  Indication: Pain, generalized body aches   QUEtiapine 200 MG tablet Commonly known as: SEROQUEL Take 1 tablet (200 mg total) by mouth at bedtime.  Indication: Major Depressive  Disorder   traZODone 100 MG tablet Commonly known as: DESYREL Take 1 tablet (100 mg total) by mouth at bedtime as needed for sleep.  Indication: Trouble Sleeping        Follow-up Information     [facility] Follow up.   Specialty: Addiction Medicine Why: Referral made Contact  information: [address] [place] [address] [phone]         Center[facility]. Schedule an appointment as soon as possible for a visit.   Why: Please call this provider personally (per provider request) to schedule an appointment for therapy services. Contact information: [address] [place], [place] [address] [phone]         [facility] Go on 05/12/2023.   Why: You have an appointment for medication management services on 05/12/23 at 10:00 am.  This appointment will be held in person. Contact information: [address] [place] [address] [phone]                 Follow-up recommendations:   Activity: as tolerated  Diet: heart healthy  Other: -Follow-up with your outpatient psychiatric provider -instructions on appointment date, time, and address (location) are provided to you in discharge paperwork.  -Take your psychiatric medications as prescribed at discharge - instructions are provided to you in the discharge paperwork  -Follow-up with outpatient primary care doctor and other specialists -for management of preventative medicine and chronic medical disease Hepatitis C High cholesterol   -If you are prescribed an atypical antipsychotic medication, we recommend that your outpatient psychiatrist follow routine screening for side effects within 3 months of discharge, including monitoring: AIMS scale, height, weight, blood pressure, fasting lipid panel, HbA1c, and fasting blood sugar.   -Recommend total abstinence from alcohol, tobacco, and other illicit  drug use at discharge.   -If your psychiatric symptoms recur, worsen, or if you have side effects to your psychiatric medications, call your outpatient psychiatric provider, 911, [phone] or go to the nearest emergency department.  -If suicidal thoughts occur, immediately call your outpatient psychiatric provider, 911, 988 or go to the nearest emergency department.   Signed: Cristy Hilts, MD 04/23/2023, 8:48 AM   Total Time Spent in Direct Patient Care:  I personally spent 35 minutes on the unit in direct patient care. The direct patient care time included face-to-face time with the patient, reviewing the patient's chart, communicating with other professionals, and coordinating care. Greater than 50% of this time was spent in counseling or coordinating care with the patient regarding goals of hospitalization, psycho-education, and discharge planning needs.   Phineas Inches, MD Psychiatrist

## 2023-04-23 NOTE — [facility] Suicide Risk Assessment (Signed)
[facility] INPATIENT:  Family/Significant Other Suicide Prevention Education  Suicide Prevention Education:  Patient Discharged to Other Healthcare Facility:  Suicide Prevention Education Not Provided: {PT. DISCHARGED TO OTHER HEALTHCARE FACILITY:SUICIDE PREVENTION EDUCATION NOT PROVIDED ([facility]):  The patient is discharging to another healthcare facility for continuation of treatment.  The patient's medical information, including suicide ideations and risk factors, are a part of the medical information shared with the receiving healthcare facility.  Jamea Robicheaux S Jaykub Mackins 04/23/2023, 7:59 AM

## 2023-04-23 NOTE — [facility] Suicide Risk Assessment (Signed)
[facility] Discharge Suicide Risk Assessment   Principal Problem: Bipolar 2 disorder, major depressive episode (HCC) Discharge Diagnoses: Principal Problem:   Bipolar 2 disorder, major depressive episode (HCC) Active Problems:   Opioid use disorder   Methamphetamine abuse (HCC)   Opioid use with withdrawal (HCC)   GAD (generalized anxiety disorder)   Pure hypercholesterolemia   Total Time spent with patient: 20 minutes  Brian [name] is a 37 yo CM with a prior mental health history of ADHD, bipolar 2 disorder, PTSD, anxiety, who presented to the [facility] on 06/21 with complaints of worsening depressive symptoms and SI with a plan to overdose on fentanyl in the context of his recent relapse. Patient reported being kicked out from an [place] house where he was staying due to his relapse and is now homeless. He was transferred voluntarily to this behavioral health Hospital for treatment and stabilization of his mental status on 06/21.   During the patient's hospitalization, patient had extensive initial psychiatric evaluation, and follow-up psychiatric evaluations every day.   Psychiatric diagnoses provided upon initial assessment:  Bipolar 2 disorder, major depressive episode (HCC)   Opioid use disorder   Methamphetamine abuse (HCC)   Opioid use with withdrawal (HCC)   GAD (generalized anxiety disorder)   Patient's psychiatric medications were adjusted on admission:  -Start Wellbutrin 150 mg daily for depressive symptoms -Start gabapentin 200 mg 3 times daily for anxiety -Continue Seroquel and increase to 200 mg nightly for sleep and mood stabilization -Continue clonidine taper for opioid detox as per the [facility] -Continue COWS protocol   During the hospitalization, other adjustments were made to the patient's psychiatric medication regimen:  -wellbutrin increased to XL 300 mg once daily   Psychiatric Specialty Exam  Presentation  General Appearance:   Fairly Groomed  Eye Contact: Fair  Speech: Normal Rate  Speech Volume: Normal  Handedness: Right   Mood and Affect  Mood: Irritable  Duration of Depression Symptoms: Greater than two weeks  Affect: Congruent; Full Range   Thought Process  Thought Processes: Linear  Descriptions of Associations:Intact  Orientation:Full (Time, Place and Person)  Thought Content:Logical  History of Schizophrenia/Schizoaffective disorder:No  Duration of Psychotic Symptoms:No data recorded Hallucinations:Hallucinations: None  Ideas of Reference:None  Suicidal Thoughts:Suicidal Thoughts: No  Homicidal Thoughts:Homicidal Thoughts: No   Sensorium  Memory: Immediate Good; Recent Good; Remote Good  Judgment: Fair  Insight: Fair   Art therapist  Concentration: Fair  Attention Span: Fair  Recall: Good  Fund of Knowledge: Fair  Language: Fair   Psychomotor Activity  Psychomotor Activity: Psychomotor Activity: Normal   Assets  Assets: Resilience   Sleep  Sleep: Sleep: Fair   Physical Exam: Physical Exam See discharge summary  ROS See discharge summary  Blood pressure 100/69, pulse 89, temperature 98 F (36.7 C), temperature source Oral, resp. rate 18, height 5\' 8"  (1.727 m), weight 68 kg, SpO2 100 %. Body mass index is 22.81 kg/m.  Mental Status Per Nursing Assessment::   On Admission:  Suicidal ideation indicated by patient  Demographic factors:  Male Loss Factors:  Financial problems / change in socioeconomic status, Loss of significant relationship Historical Factors:  Prior suicide attempts Risk Reduction Factors:  Positive social support  Continued Clinical Symptoms:  Mood is stable. Denying SI, HI.   Cognitive Features That Contribute To Risk:  None    Suicide Risk:  Mild:  There are no identifiable suicide plans, no associated intent, mild dysphoria and related symptoms, good self-control (both  objective and subjective  assessment), few other risk factors, and identifiable protective factors, including available and accessible social support.    Follow-up Information     [facility] Follow up.   Specialty: Addiction Medicine Why: Referral made Contact information: [address] [place] [address] [phone]         [facility]. Schedule an appointment as soon as possible for a visit.   Why: Please call this provider personally (per provider request) to schedule an appointment for therapy services. Contact information: [address] [place], [place] [address] [phone]         [facility] Go on 05/12/2023.   Why: You have an appointment for medication management services on 05/12/23 at 10:00 am.  This appointment will be held in person. Contact information: [address] [place] [address] [phone]                 Plan Of Care/Follow-up recommendations:   -Follow-up with your outpatient psychiatric provider -instructions on appointment date, time, and address (location) are provided to you in discharge paperwork.   -Take your psychiatric medications as prescribed at discharge - instructions are provided to you in the discharge paperwork   -Follow-up with outpatient primary care doctor and other specialists -for management of preventative medicine and chronic medical disease Hepatitis C High cholesterol    -If you are prescribed an atypical antipsychotic medication, we recommend that your outpatient psychiatrist follow routine screening for side effects within 3 months of discharge, including monitoring: AIMS scale, height, weight, blood pressure, fasting lipid panel, HbA1c, and fasting blood sugar.    -Recommend total abstinence from alcohol, tobacco, and other illicit drug use at discharge.    -If your psychiatric symptoms recur, worsen, or if you have  side effects to your psychiatric medications, call your outpatient psychiatric provider, 911, 988 or go to the nearest emergency department.   -If suicidal thoughts occur, immediately call your outpatient psychiatric provider, 911, 988 or go to the nearest emergency department.   Cristy Hilts, MD 04/23/2023, 8:56 AM

## 2023-04-23 NOTE — Progress Notes (Signed)
   04/23/23 0559  15 Minute Checks  Location Bedroom  Visual Appearance Calm  Behavior Sleeping  Sleep (Behavioral Health Patients Only)  Calculate sleep? (Click Yes once per 24 hr at 0600 safety check) Yes  Documented sleep last 24 hours 8

## 2023-04-23 NOTE — Discharge Instructions (Signed)
-  Follow-up with your outpatient psychiatric provider -instructions on appointment date, time, and address (location) are provided to you in discharge paperwork.  -Take your psychiatric medications as prescribed at discharge - instructions are provided to you in the discharge paperwork You will get 14 day sample at discharge and printed Rx for 30 days.   -Follow-up with outpatient primary care doctor and other specialists -for management of preventative medicine and any chronic medical disease.  -Recommend abstinence from alcohol, tobacco, and other illicit drug use at discharge.   -If your psychiatric symptoms recur, worsen, or if you have side effects to your psychiatric medications, call your outpatient psychiatric provider, 911, 988 or go to the nearest emergency department.  -If suicidal thoughts occur, call your outpatient psychiatric provider, 911, 988 or go to the nearest emergency department.  Naloxone (Narcan) can help reverse an overdose when given to the victim quickly.  [facility] offers free naloxone kits and instructions/training on its use.  Add naloxone to your first aid kit and you can help save a life.   Pick up your free kit at the following locations:   [place]:  [facility], [address] [place] [address] [phone]) [facility] [address] [place] [address] [phone]) [facility] [address] [place] [address]  [place]: [facility] [address] [place] [address] ([phone]) [facility] [address] [place] [address] [phone])

## 2023-04-23 NOTE — Progress Notes (Signed)
  [facility] Adult Case Management Discharge Plan :  Will you be returning to the same living situation after discharge:  No.Pt will be going to [facility] At discharge, do you have transportation home?:  [facility] is providing Transportation Do you have the ability to pay for your medications: Yes,  insured  Release of information consent forms completed and in the chart;  Patient's signature needed at discharge.  Patient to Follow up at:  Follow-up Information     [facility] Follow up.   Specialty: Addiction Medicine Why: Referral made Contact information: [address] [place] [address] [phone]         Center[facility]. Schedule an appointment as soon as possible for a visit.   Why: Please call this provider personally (per provider request) to schedule an appointment for therapy services. Contact information: [address] [place], [place] [address] [phone]         [facility] Go on 05/12/2023.   Why: You have an appointment for medication management services on 05/12/23 at 10:00 am.  This appointment will be held in person. Contact information: [address] [place] [address] [phone]                 Next level of care provider has access to [facility] Link:yes  Safety Planning and Suicide Prevention discussed: No.     Has patient been referred to the Quitline?: Patient refused referral for treatment  Patient has been referred for addiction treatment: Yes, referral information given but appointment not made [facility] (list facility). Patient to continue working towards treatment goals after discharge. Patient no longer meets criteria for inpatient criteria per attending physician. Continue taking medications as prescribed, nursing to provide instructions at discharge. Follow up with all scheduled  appointments.   Shelisha Gautier S Charese Abundis, LCSW 04/23/2023, 8:00 AM

## 2023-04-23 NOTE — Progress Notes (Signed)
   04/22/23 2110  Psych Admission Type (Psych Patients Only)  Admission Status Voluntary  Psychosocial Assessment  Patient Complaints Anxiety;Substance abuse  Eye Contact Fair  Facial Expression Flat  Affect Anxious  Speech Logical/coherent  Interaction Assertive  Motor Activity Fidgety  Appearance/Hygiene Unremarkable  Behavior Characteristics Cooperative;Calm  Mood Anxious;Pleasant  Thought Process  Coherency WDL  Content WDL  Delusions None reported or observed  Perception WDL  Hallucination None reported or observed  Judgment Impaired  Confusion None  Danger to Self  Current suicidal ideation? Denies  Self-Injurious Behavior No self-injurious ideation or behavior indicators observed or expressed   Agreement Not to Harm Self Yes  Description of Agreement verbal  Danger to Others  Danger to Others None reported or observed   Pt reports 5/10 depression and anxiety. Pt was offered support and encouragement. Pt was given scheduled medications and PRN Trazodone per MAR. Q 15 minute checks were done for safety. Pt attended group and interacts well with peers and staff.  Pt has no complaints.Pt receptive to treatment and safety maintained on unit.

## 2023-04-23 NOTE — Progress Notes (Signed)
Patient ID: Brian [name], male   DOB: 12/20/1985, 37 y.o.   MRN: [id number] Order received for pt discharge. AVS instructions given including discharge sra and a copy of suicide safety plan. Patient denies any SI HI or AV Hallucinations. Aware of follow up care plan and crisis services reviewed at length. Patient given 2 week supply of medications and RX for discharge. All belongings returned and pt escorted to cab at discharge to [facility] [place], ''[facility] ''

## 2023-04-24 DIAGNOSIS — F1120 Opioid dependence, uncomplicated: Secondary | ICD-10-CM | POA: Diagnosis not present

## 2023-04-24 DIAGNOSIS — F1520 Other stimulant dependence, uncomplicated: Secondary | ICD-10-CM | POA: Diagnosis not present

## 2023-04-25 DIAGNOSIS — F1520 Other stimulant dependence, uncomplicated: Secondary | ICD-10-CM | POA: Diagnosis not present

## 2023-04-25 DIAGNOSIS — F1120 Opioid dependence, uncomplicated: Secondary | ICD-10-CM | POA: Diagnosis not present

## 2023-04-26 DIAGNOSIS — F1520 Other stimulant dependence, uncomplicated: Secondary | ICD-10-CM | POA: Diagnosis not present

## 2023-04-26 DIAGNOSIS — F1120 Opioid dependence, uncomplicated: Secondary | ICD-10-CM | POA: Diagnosis not present

## 2023-04-27 DIAGNOSIS — F1520 Other stimulant dependence, uncomplicated: Secondary | ICD-10-CM | POA: Diagnosis not present

## 2023-04-27 DIAGNOSIS — F1120 Opioid dependence, uncomplicated: Secondary | ICD-10-CM | POA: Diagnosis not present

## 2023-04-28 DIAGNOSIS — F1120 Opioid dependence, uncomplicated: Secondary | ICD-10-CM | POA: Diagnosis not present

## 2023-04-28 DIAGNOSIS — F1520 Other stimulant dependence, uncomplicated: Secondary | ICD-10-CM | POA: Diagnosis not present

## 2023-04-29 DIAGNOSIS — F1520 Other stimulant dependence, uncomplicated: Secondary | ICD-10-CM | POA: Diagnosis not present

## 2023-04-29 DIAGNOSIS — F1120 Opioid dependence, uncomplicated: Secondary | ICD-10-CM | POA: Diagnosis not present

## 2023-04-30 DIAGNOSIS — F1120 Opioid dependence, uncomplicated: Secondary | ICD-10-CM | POA: Diagnosis not present

## 2023-04-30 DIAGNOSIS — F1520 Other stimulant dependence, uncomplicated: Secondary | ICD-10-CM | POA: Diagnosis not present

## 2023-05-01 DIAGNOSIS — F1120 Opioid dependence, uncomplicated: Secondary | ICD-10-CM | POA: Diagnosis not present

## 2023-05-01 DIAGNOSIS — F1520 Other stimulant dependence, uncomplicated: Secondary | ICD-10-CM | POA: Diagnosis not present

## 2023-05-02 DIAGNOSIS — F1120 Opioid dependence, uncomplicated: Secondary | ICD-10-CM | POA: Diagnosis not present

## 2023-05-02 DIAGNOSIS — F1520 Other stimulant dependence, uncomplicated: Secondary | ICD-10-CM | POA: Diagnosis not present

## 2023-05-03 DIAGNOSIS — F1120 Opioid dependence, uncomplicated: Secondary | ICD-10-CM | POA: Diagnosis not present

## 2023-05-03 DIAGNOSIS — F1520 Other stimulant dependence, uncomplicated: Secondary | ICD-10-CM | POA: Diagnosis not present

## 2023-05-04 DIAGNOSIS — F1120 Opioid dependence, uncomplicated: Secondary | ICD-10-CM | POA: Diagnosis not present

## 2023-05-04 DIAGNOSIS — F1520 Other stimulant dependence, uncomplicated: Secondary | ICD-10-CM | POA: Diagnosis not present

## 2023-05-05 DIAGNOSIS — F1520 Other stimulant dependence, uncomplicated: Secondary | ICD-10-CM | POA: Diagnosis not present

## 2023-05-05 DIAGNOSIS — F1120 Opioid dependence, uncomplicated: Secondary | ICD-10-CM | POA: Diagnosis not present

## 2023-05-06 DIAGNOSIS — F1520 Other stimulant dependence, uncomplicated: Secondary | ICD-10-CM | POA: Diagnosis not present

## 2023-05-06 DIAGNOSIS — F1120 Opioid dependence, uncomplicated: Secondary | ICD-10-CM | POA: Diagnosis not present

## 2023-05-07 DIAGNOSIS — F1120 Opioid dependence, uncomplicated: Secondary | ICD-10-CM | POA: Diagnosis not present

## 2023-05-07 DIAGNOSIS — F1520 Other stimulant dependence, uncomplicated: Secondary | ICD-10-CM | POA: Diagnosis not present

## 2023-05-08 DIAGNOSIS — F1120 Opioid dependence, uncomplicated: Secondary | ICD-10-CM | POA: Diagnosis not present

## 2023-05-08 DIAGNOSIS — F1520 Other stimulant dependence, uncomplicated: Secondary | ICD-10-CM | POA: Diagnosis not present

## 2023-05-09 DIAGNOSIS — F1520 Other stimulant dependence, uncomplicated: Secondary | ICD-10-CM | POA: Diagnosis not present

## 2023-05-09 DIAGNOSIS — F1120 Opioid dependence, uncomplicated: Secondary | ICD-10-CM | POA: Diagnosis not present

## 2023-05-10 DIAGNOSIS — F1520 Other stimulant dependence, uncomplicated: Secondary | ICD-10-CM | POA: Diagnosis not present

## 2023-05-10 DIAGNOSIS — F1120 Opioid dependence, uncomplicated: Secondary | ICD-10-CM | POA: Diagnosis not present

## 2023-05-11 DIAGNOSIS — F1520 Other stimulant dependence, uncomplicated: Secondary | ICD-10-CM | POA: Diagnosis not present

## 2023-05-11 DIAGNOSIS — F1120 Opioid dependence, uncomplicated: Secondary | ICD-10-CM | POA: Diagnosis not present

## 2023-05-12 DIAGNOSIS — F1120 Opioid dependence, uncomplicated: Secondary | ICD-10-CM | POA: Diagnosis not present

## 2023-05-12 DIAGNOSIS — F1520 Other stimulant dependence, uncomplicated: Secondary | ICD-10-CM | POA: Diagnosis not present

## 2023-05-13 DIAGNOSIS — F1520 Other stimulant dependence, uncomplicated: Secondary | ICD-10-CM | POA: Diagnosis not present

## 2023-05-13 DIAGNOSIS — F1120 Opioid dependence, uncomplicated: Secondary | ICD-10-CM | POA: Diagnosis not present

## 2023-05-14 DIAGNOSIS — F1120 Opioid dependence, uncomplicated: Secondary | ICD-10-CM | POA: Diagnosis not present

## 2023-05-14 DIAGNOSIS — F1520 Other stimulant dependence, uncomplicated: Secondary | ICD-10-CM | POA: Diagnosis not present

## 2023-05-15 DIAGNOSIS — F1520 Other stimulant dependence, uncomplicated: Secondary | ICD-10-CM | POA: Diagnosis not present

## 2023-05-15 DIAGNOSIS — F1120 Opioid dependence, uncomplicated: Secondary | ICD-10-CM | POA: Diagnosis not present

## 2023-05-16 DIAGNOSIS — F1120 Opioid dependence, uncomplicated: Secondary | ICD-10-CM | POA: Diagnosis not present

## 2023-05-16 DIAGNOSIS — F1520 Other stimulant dependence, uncomplicated: Secondary | ICD-10-CM | POA: Diagnosis not present

## 2023-05-17 DIAGNOSIS — F1520 Other stimulant dependence, uncomplicated: Secondary | ICD-10-CM | POA: Diagnosis not present

## 2023-05-17 DIAGNOSIS — F1120 Opioid dependence, uncomplicated: Secondary | ICD-10-CM | POA: Diagnosis not present

## 2023-05-18 DIAGNOSIS — F1520 Other stimulant dependence, uncomplicated: Secondary | ICD-10-CM | POA: Diagnosis not present

## 2023-05-18 DIAGNOSIS — F1120 Opioid dependence, uncomplicated: Secondary | ICD-10-CM | POA: Diagnosis not present

## 2023-05-19 DIAGNOSIS — F1520 Other stimulant dependence, uncomplicated: Secondary | ICD-10-CM | POA: Diagnosis not present

## 2023-05-19 DIAGNOSIS — F1120 Opioid dependence, uncomplicated: Secondary | ICD-10-CM | POA: Diagnosis not present

## 2023-05-20 DIAGNOSIS — F1520 Other stimulant dependence, uncomplicated: Secondary | ICD-10-CM | POA: Diagnosis not present

## 2023-05-20 DIAGNOSIS — F1120 Opioid dependence, uncomplicated: Secondary | ICD-10-CM | POA: Diagnosis not present

## 2023-05-21 DIAGNOSIS — F1120 Opioid dependence, uncomplicated: Secondary | ICD-10-CM | POA: Diagnosis not present

## 2023-05-21 DIAGNOSIS — F1520 Other stimulant dependence, uncomplicated: Secondary | ICD-10-CM | POA: Diagnosis not present

## 2023-05-22 DIAGNOSIS — F1520 Other stimulant dependence, uncomplicated: Secondary | ICD-10-CM | POA: Diagnosis not present

## 2023-05-22 DIAGNOSIS — F1120 Opioid dependence, uncomplicated: Secondary | ICD-10-CM | POA: Diagnosis not present

## 2023-05-23 DIAGNOSIS — F1520 Other stimulant dependence, uncomplicated: Secondary | ICD-10-CM | POA: Diagnosis not present

## 2023-05-23 DIAGNOSIS — F1120 Opioid dependence, uncomplicated: Secondary | ICD-10-CM | POA: Diagnosis not present

## 2023-05-24 DIAGNOSIS — F1520 Other stimulant dependence, uncomplicated: Secondary | ICD-10-CM | POA: Diagnosis not present

## 2023-05-24 DIAGNOSIS — F1120 Opioid dependence, uncomplicated: Secondary | ICD-10-CM | POA: Diagnosis not present

## 2023-05-25 DIAGNOSIS — F1120 Opioid dependence, uncomplicated: Secondary | ICD-10-CM | POA: Diagnosis not present

## 2023-05-25 DIAGNOSIS — F1520 Other stimulant dependence, uncomplicated: Secondary | ICD-10-CM | POA: Diagnosis not present

## 2023-05-26 DIAGNOSIS — F1520 Other stimulant dependence, uncomplicated: Secondary | ICD-10-CM | POA: Diagnosis not present

## 2023-05-26 DIAGNOSIS — F1120 Opioid dependence, uncomplicated: Secondary | ICD-10-CM | POA: Diagnosis not present

## 2023-05-27 DIAGNOSIS — F1520 Other stimulant dependence, uncomplicated: Secondary | ICD-10-CM | POA: Diagnosis not present

## 2023-05-27 DIAGNOSIS — F1120 Opioid dependence, uncomplicated: Secondary | ICD-10-CM | POA: Diagnosis not present

## 2023-05-28 DIAGNOSIS — F1520 Other stimulant dependence, uncomplicated: Secondary | ICD-10-CM | POA: Diagnosis not present

## 2023-05-28 DIAGNOSIS — F1120 Opioid dependence, uncomplicated: Secondary | ICD-10-CM | POA: Diagnosis not present

## 2023-05-29 DIAGNOSIS — F1520 Other stimulant dependence, uncomplicated: Secondary | ICD-10-CM | POA: Diagnosis not present

## 2023-05-29 DIAGNOSIS — F1120 Opioid dependence, uncomplicated: Secondary | ICD-10-CM | POA: Diagnosis not present

## 2023-05-30 DIAGNOSIS — F1520 Other stimulant dependence, uncomplicated: Secondary | ICD-10-CM | POA: Diagnosis not present

## 2023-05-30 DIAGNOSIS — F1120 Opioid dependence, uncomplicated: Secondary | ICD-10-CM | POA: Diagnosis not present

## 2023-05-31 DIAGNOSIS — F1120 Opioid dependence, uncomplicated: Secondary | ICD-10-CM | POA: Diagnosis not present

## 2023-05-31 DIAGNOSIS — F1520 Other stimulant dependence, uncomplicated: Secondary | ICD-10-CM | POA: Diagnosis not present

## 2023-06-01 DIAGNOSIS — F1520 Other stimulant dependence, uncomplicated: Secondary | ICD-10-CM | POA: Diagnosis not present

## 2023-06-01 DIAGNOSIS — F1120 Opioid dependence, uncomplicated: Secondary | ICD-10-CM | POA: Diagnosis not present

## 2023-06-02 DIAGNOSIS — F1520 Other stimulant dependence, uncomplicated: Secondary | ICD-10-CM | POA: Diagnosis not present

## 2023-06-02 DIAGNOSIS — F1120 Opioid dependence, uncomplicated: Secondary | ICD-10-CM | POA: Diagnosis not present

## 2023-06-03 DIAGNOSIS — F1520 Other stimulant dependence, uncomplicated: Secondary | ICD-10-CM | POA: Diagnosis not present

## 2023-06-03 DIAGNOSIS — F1120 Opioid dependence, uncomplicated: Secondary | ICD-10-CM | POA: Diagnosis not present

## 2023-06-04 DIAGNOSIS — F1120 Opioid dependence, uncomplicated: Secondary | ICD-10-CM | POA: Diagnosis not present

## 2023-06-04 DIAGNOSIS — F1520 Other stimulant dependence, uncomplicated: Secondary | ICD-10-CM | POA: Diagnosis not present

## 2023-06-05 DIAGNOSIS — F1120 Opioid dependence, uncomplicated: Secondary | ICD-10-CM | POA: Diagnosis not present

## 2023-06-05 DIAGNOSIS — F1520 Other stimulant dependence, uncomplicated: Secondary | ICD-10-CM | POA: Diagnosis not present

## 2023-06-06 DIAGNOSIS — M7989 Other specified soft tissue disorders: Secondary | ICD-10-CM | POA: Diagnosis not present

## 2023-06-06 DIAGNOSIS — M79661 Pain in right lower leg: Secondary | ICD-10-CM | POA: Diagnosis not present

## 2023-06-06 DIAGNOSIS — I8391 Asymptomatic varicose veins of right lower extremity: Secondary | ICD-10-CM | POA: Diagnosis not present

## 2023-06-06 DIAGNOSIS — R600 Localized edema: Secondary | ICD-10-CM | POA: Diagnosis not present

## 2023-06-06 DIAGNOSIS — F1520 Other stimulant dependence, uncomplicated: Secondary | ICD-10-CM | POA: Diagnosis not present

## 2023-06-06 DIAGNOSIS — F1120 Opioid dependence, uncomplicated: Secondary | ICD-10-CM | POA: Diagnosis not present

## 2023-06-07 DIAGNOSIS — F1120 Opioid dependence, uncomplicated: Secondary | ICD-10-CM | POA: Diagnosis not present

## 2023-06-07 DIAGNOSIS — F1520 Other stimulant dependence, uncomplicated: Secondary | ICD-10-CM | POA: Diagnosis not present

## 2023-06-08 DIAGNOSIS — F1520 Other stimulant dependence, uncomplicated: Secondary | ICD-10-CM | POA: Diagnosis not present

## 2023-06-08 DIAGNOSIS — F1120 Opioid dependence, uncomplicated: Secondary | ICD-10-CM | POA: Diagnosis not present

## 2023-06-09 DIAGNOSIS — F1120 Opioid dependence, uncomplicated: Secondary | ICD-10-CM | POA: Diagnosis not present

## 2023-06-09 DIAGNOSIS — F1520 Other stimulant dependence, uncomplicated: Secondary | ICD-10-CM | POA: Diagnosis not present

## 2023-06-10 DIAGNOSIS — F1520 Other stimulant dependence, uncomplicated: Secondary | ICD-10-CM | POA: Diagnosis not present

## 2023-06-10 DIAGNOSIS — F1120 Opioid dependence, uncomplicated: Secondary | ICD-10-CM | POA: Diagnosis not present

## 2023-06-11 DIAGNOSIS — F1120 Opioid dependence, uncomplicated: Secondary | ICD-10-CM | POA: Diagnosis not present

## 2023-06-11 DIAGNOSIS — F1520 Other stimulant dependence, uncomplicated: Secondary | ICD-10-CM | POA: Diagnosis not present

## 2023-06-12 DIAGNOSIS — F1120 Opioid dependence, uncomplicated: Secondary | ICD-10-CM | POA: Diagnosis not present

## 2023-06-12 DIAGNOSIS — F1520 Other stimulant dependence, uncomplicated: Secondary | ICD-10-CM | POA: Diagnosis not present

## 2023-06-13 DIAGNOSIS — F1120 Opioid dependence, uncomplicated: Secondary | ICD-10-CM | POA: Diagnosis not present

## 2023-06-13 DIAGNOSIS — F1520 Other stimulant dependence, uncomplicated: Secondary | ICD-10-CM | POA: Diagnosis not present

## 2023-06-14 DIAGNOSIS — F1520 Other stimulant dependence, uncomplicated: Secondary | ICD-10-CM | POA: Diagnosis not present

## 2023-06-14 DIAGNOSIS — F1120 Opioid dependence, uncomplicated: Secondary | ICD-10-CM | POA: Diagnosis not present

## 2023-06-15 DIAGNOSIS — F1520 Other stimulant dependence, uncomplicated: Secondary | ICD-10-CM | POA: Diagnosis not present

## 2023-06-15 DIAGNOSIS — F1120 Opioid dependence, uncomplicated: Secondary | ICD-10-CM | POA: Diagnosis not present

## 2023-06-16 DIAGNOSIS — F1120 Opioid dependence, uncomplicated: Secondary | ICD-10-CM | POA: Diagnosis not present

## 2023-06-16 DIAGNOSIS — F1520 Other stimulant dependence, uncomplicated: Secondary | ICD-10-CM | POA: Diagnosis not present

## 2023-06-17 DIAGNOSIS — F1120 Opioid dependence, uncomplicated: Secondary | ICD-10-CM | POA: Diagnosis not present

## 2023-06-17 DIAGNOSIS — F1520 Other stimulant dependence, uncomplicated: Secondary | ICD-10-CM | POA: Diagnosis not present

## 2023-06-18 DIAGNOSIS — F1520 Other stimulant dependence, uncomplicated: Secondary | ICD-10-CM | POA: Diagnosis not present

## 2023-06-18 DIAGNOSIS — F1120 Opioid dependence, uncomplicated: Secondary | ICD-10-CM | POA: Diagnosis not present

## 2023-06-19 DIAGNOSIS — F1120 Opioid dependence, uncomplicated: Secondary | ICD-10-CM | POA: Diagnosis not present

## 2023-06-19 DIAGNOSIS — F1520 Other stimulant dependence, uncomplicated: Secondary | ICD-10-CM | POA: Diagnosis not present

## 2023-06-20 DIAGNOSIS — F1120 Opioid dependence, uncomplicated: Secondary | ICD-10-CM | POA: Diagnosis not present

## 2023-06-20 DIAGNOSIS — F1520 Other stimulant dependence, uncomplicated: Secondary | ICD-10-CM | POA: Diagnosis not present

## 2023-06-21 DIAGNOSIS — F1120 Opioid dependence, uncomplicated: Secondary | ICD-10-CM | POA: Diagnosis not present

## 2023-06-21 DIAGNOSIS — F1520 Other stimulant dependence, uncomplicated: Secondary | ICD-10-CM | POA: Diagnosis not present

## 2023-06-22 DIAGNOSIS — F1520 Other stimulant dependence, uncomplicated: Secondary | ICD-10-CM | POA: Diagnosis not present

## 2023-06-22 DIAGNOSIS — F1120 Opioid dependence, uncomplicated: Secondary | ICD-10-CM | POA: Diagnosis not present

## 2023-06-24 DIAGNOSIS — F1520 Other stimulant dependence, uncomplicated: Secondary | ICD-10-CM | POA: Diagnosis not present

## 2023-06-24 DIAGNOSIS — F1120 Opioid dependence, uncomplicated: Secondary | ICD-10-CM | POA: Diagnosis not present

## 2023-06-26 DIAGNOSIS — F909 Attention-deficit hyperactivity disorder, unspecified type: Secondary | ICD-10-CM | POA: Diagnosis not present

## 2023-06-26 DIAGNOSIS — F319 Bipolar disorder, unspecified: Secondary | ICD-10-CM | POA: Diagnosis not present

## 2023-06-26 DIAGNOSIS — F17209 Nicotine dependence, unspecified, with unspecified nicotine-induced disorders: Secondary | ICD-10-CM | POA: Diagnosis not present

## 2023-06-26 DIAGNOSIS — F1120 Opioid dependence, uncomplicated: Secondary | ICD-10-CM | POA: Diagnosis not present

## 2023-06-26 DIAGNOSIS — G894 Chronic pain syndrome: Secondary | ICD-10-CM | POA: Diagnosis not present

## 2023-07-03 DIAGNOSIS — F1120 Opioid dependence, uncomplicated: Secondary | ICD-10-CM | POA: Diagnosis not present

## 2023-07-03 DIAGNOSIS — F909 Attention-deficit hyperactivity disorder, unspecified type: Secondary | ICD-10-CM | POA: Diagnosis not present

## 2023-07-03 DIAGNOSIS — G894 Chronic pain syndrome: Secondary | ICD-10-CM | POA: Diagnosis not present

## 2023-07-15 DIAGNOSIS — F17209 Nicotine dependence, unspecified, with unspecified nicotine-induced disorders: Secondary | ICD-10-CM | POA: Diagnosis not present

## 2023-07-15 DIAGNOSIS — Z6826 Body mass index (BMI) 26.0-26.9, adult: Secondary | ICD-10-CM | POA: Diagnosis not present

## 2023-07-15 DIAGNOSIS — F1120 Opioid dependence, uncomplicated: Secondary | ICD-10-CM | POA: Diagnosis not present

## 2023-07-15 DIAGNOSIS — F319 Bipolar disorder, unspecified: Secondary | ICD-10-CM | POA: Diagnosis not present

## 2023-08-21 DIAGNOSIS — F1120 Opioid dependence, uncomplicated: Secondary | ICD-10-CM | POA: Diagnosis not present

## 2023-08-21 DIAGNOSIS — G894 Chronic pain syndrome: Secondary | ICD-10-CM | POA: Diagnosis not present

## 2023-09-22 DIAGNOSIS — F1120 Opioid dependence, uncomplicated: Secondary | ICD-10-CM | POA: Diagnosis not present

## 2023-09-22 DIAGNOSIS — F319 Bipolar disorder, unspecified: Secondary | ICD-10-CM | POA: Diagnosis not present

## 2023-10-19 DIAGNOSIS — F319 Bipolar disorder, unspecified: Secondary | ICD-10-CM | POA: Diagnosis not present

## 2023-10-19 DIAGNOSIS — F1120 Opioid dependence, uncomplicated: Secondary | ICD-10-CM | POA: Diagnosis not present

## 2023-11-18 DIAGNOSIS — F319 Bipolar disorder, unspecified: Secondary | ICD-10-CM | POA: Diagnosis not present

## 2023-11-18 DIAGNOSIS — F1120 Opioid dependence, uncomplicated: Secondary | ICD-10-CM | POA: Diagnosis not present

## 2023-11-18 DIAGNOSIS — F909 Attention-deficit hyperactivity disorder, unspecified type: Secondary | ICD-10-CM | POA: Diagnosis not present
